# Patient Record
Sex: Male | Born: 1960 | Race: Black or African American | Hispanic: No | Marital: Single | Smoking: Current every day smoker
Health system: Southern US, Community
[De-identification: ages and names within clinical notes are randomized; demographics above are authoritative.]

## PROBLEM LIST (undated history)

## (undated) DIAGNOSIS — J449 Chronic obstructive pulmonary disease, unspecified: Secondary | ICD-10-CM

## (undated) DIAGNOSIS — J40 Bronchitis, not specified as acute or chronic: Secondary | ICD-10-CM

## (undated) DIAGNOSIS — C801 Malignant (primary) neoplasm, unspecified: Secondary | ICD-10-CM

## (undated) DIAGNOSIS — M1990 Unspecified osteoarthritis, unspecified site: Secondary | ICD-10-CM

## (undated) DIAGNOSIS — I10 Essential (primary) hypertension: Secondary | ICD-10-CM

## (undated) DIAGNOSIS — C3490 Malignant neoplasm of unspecified part of unspecified bronchus or lung: Secondary | ICD-10-CM

## (undated) HISTORY — PX: TOTAL HIP ARTHROPLASTY: SHX124

## (undated) HISTORY — PX: LUNG BIOPSY: SHX232

## (undated) HISTORY — DX: Malignant neoplasm of unspecified part of unspecified bronchus or lung: C34.90

---

## 2004-08-06 ENCOUNTER — Emergency Department (HOSPITAL_COMMUNITY): Admission: EM | Admit: 2004-08-06 | Discharge: 2004-08-06 | Payer: Self-pay | Admitting: Emergency Medicine

## 2007-11-12 ENCOUNTER — Ambulatory Visit (HOSPITAL_COMMUNITY): Admission: RE | Admit: 2007-11-12 | Discharge: 2007-11-12 | Payer: Self-pay | Admitting: Internal Medicine

## 2008-01-15 ENCOUNTER — Emergency Department (HOSPITAL_COMMUNITY): Admission: EM | Admit: 2008-01-15 | Discharge: 2008-01-15 | Payer: Self-pay | Admitting: Emergency Medicine

## 2008-12-21 ENCOUNTER — Emergency Department (HOSPITAL_COMMUNITY): Admission: EM | Admit: 2008-12-21 | Discharge: 2008-12-21 | Payer: Self-pay | Admitting: Emergency Medicine

## 2008-12-23 ENCOUNTER — Emergency Department (HOSPITAL_COMMUNITY): Admission: EM | Admit: 2008-12-23 | Discharge: 2008-12-23 | Payer: Self-pay | Admitting: Emergency Medicine

## 2009-01-03 ENCOUNTER — Ambulatory Visit: Payer: Self-pay | Admitting: Cardiology

## 2010-02-22 ENCOUNTER — Emergency Department (HOSPITAL_COMMUNITY): Admission: EM | Admit: 2010-02-22 | Discharge: 2010-02-22 | Payer: Self-pay | Admitting: Emergency Medicine

## 2010-06-27 ENCOUNTER — Ambulatory Visit: Payer: Self-pay | Admitting: Psychiatry

## 2010-06-27 ENCOUNTER — Emergency Department (HOSPITAL_COMMUNITY): Admission: EM | Admit: 2010-06-27 | Discharge: 2010-06-27 | Payer: Self-pay | Admitting: Emergency Medicine

## 2010-06-28 ENCOUNTER — Inpatient Hospital Stay (HOSPITAL_COMMUNITY): Admission: EM | Admit: 2010-06-28 | Discharge: 2010-07-01 | Payer: Self-pay | Admitting: Psychiatry

## 2011-01-13 LAB — BASIC METABOLIC PANEL
CO2: 26 mEq/L (ref 19–32)
Calcium: 8.5 mg/dL (ref 8.4–10.5)
Chloride: 106 mEq/L (ref 96–112)
GFR calc non Af Amer: >60 mL/min (ref >60)
Glucose, Bld: 146 mg/dL — ABNORMAL HIGH (ref 70–99)
Potassium: 3.7 mEq/L (ref 3.5–5.1)
Sodium: 138 mEq/L (ref 135–145)

## 2011-01-13 LAB — DIFFERENTIAL
Basophils Absolute: 0.2 10*3/uL — ABNORMAL HIGH (ref 0.0–0.1)
Basophils Relative: 3 % — ABNORMAL HIGH (ref 0–1)
Eosinophils Absolute: 0.1 10*3/uL (ref 0.0–0.7)
Eosinophils Relative: 1 % (ref 0–5)
Monocytes Absolute: 0.9 10*3/uL (ref 0.1–1.0)
Monocytes Relative: 13 % — ABNORMAL HIGH (ref 3–12)

## 2011-01-13 LAB — URINALYSIS, ROUTINE W REFLEX MICROSCOPIC
Glucose, UA: NEGATIVE mg/dL
Hgb urine dipstick: NEGATIVE
Urobilinogen, UA: 1 mg/dL (ref 0.0–1.0)

## 2011-01-13 LAB — HEPATIC FUNCTION PANEL
ALT: 57 U/L — ABNORMAL HIGH (ref 0–53)
AST: 31 U/L (ref 0–37)
Albumin: 3.7 g/dL (ref 3.5–5.2)
Bilirubin, Direct: 0.2 mg/dL (ref 0.0–0.3)
Indirect Bilirubin: 0.9 mg/dL (ref 0.3–0.9)

## 2011-01-13 LAB — CBC
HCT: 45.3 % (ref 39.0–52.0)
Hemoglobin: 15.3 g/dL (ref 13.0–17.0)
Platelets: 185 10*3/uL (ref 150–400)
RBC: 4.84 MIL/uL (ref 4.22–5.81)
RDW: 14 % (ref 11.5–15.5)

## 2011-01-13 LAB — RAPID URINE DRUG SCREEN, HOSP PERFORMED
Amphetamines: NOT DETECTED
Cocaine: NOT DETECTED

## 2011-07-24 LAB — CBC
HCT: 46.1
MCHC: 34.8
WBC: 13.6 — ABNORMAL HIGH

## 2011-07-24 LAB — URINALYSIS, ROUTINE W REFLEX MICROSCOPIC
Bilirubin Urine: NEGATIVE
Glucose, UA: NEGATIVE
Ketones, ur: NEGATIVE
Nitrite: NEGATIVE
Protein, ur: NEGATIVE
pH: 5.5

## 2011-07-24 LAB — DIFFERENTIAL
Eosinophils Absolute: 0.1
Monocytes Relative: 4

## 2011-07-24 LAB — BLOOD GAS, ARTERIAL
Bicarbonate: 24.9 — ABNORMAL HIGH
FIO2: 0.21
O2 Saturation: 89.7
Patient temperature: 37
pCO2 arterial: 42.9
pH, Arterial: 7.382
pO2, Arterial: 58.1 — ABNORMAL LOW

## 2011-07-24 LAB — COMPREHENSIVE METABOLIC PANEL
ALT: 24
AST: 19
Albumin: 3.6
BUN: 16
CO2: 27
Creatinine, Ser: 1.07
GFR calc Af Amer: >60
Glucose, Bld: 142 — ABNORMAL HIGH
Total Bilirubin: 0.5

## 2011-07-24 LAB — CK TOTAL AND CKMB (NOT AT ARMC)
CK, MB: 1.7
Relative Index: 1
Total CK: 166

## 2011-07-24 LAB — RAPID URINE DRUG SCREEN, HOSP PERFORMED
Amphetamines: NOT DETECTED
Cocaine: POSITIVE — AB
Tetrahydrocannabinol: NOT DETECTED

## 2011-07-24 LAB — ETHANOL: Alcohol, Ethyl (B): 141 — ABNORMAL HIGH

## 2011-12-17 ENCOUNTER — Emergency Department (HOSPITAL_COMMUNITY)
Admission: EM | Admit: 2011-12-17 | Discharge: 2011-12-17 | Disposition: A | Discharge status: Home / Self Care | Payer: Medicaid Other | Attending: Emergency Medicine | Admitting: Emergency Medicine

## 2011-12-17 ENCOUNTER — Encounter (HOSPITAL_COMMUNITY): Payer: Self-pay

## 2011-12-17 ENCOUNTER — Emergency Department (HOSPITAL_COMMUNITY): Payer: Medicaid Other

## 2011-12-17 DIAGNOSIS — M129 Arthropathy, unspecified: Secondary | ICD-10-CM | POA: Insufficient documentation

## 2011-12-17 DIAGNOSIS — R109 Unspecified abdominal pain: Secondary | ICD-10-CM | POA: Insufficient documentation

## 2011-12-17 DIAGNOSIS — M79609 Pain in unspecified limb: Secondary | ICD-10-CM | POA: Insufficient documentation

## 2011-12-17 DIAGNOSIS — R05 Cough: Secondary | ICD-10-CM | POA: Insufficient documentation

## 2011-12-17 DIAGNOSIS — R059 Cough, unspecified: Secondary | ICD-10-CM | POA: Insufficient documentation

## 2011-12-17 DIAGNOSIS — F17200 Nicotine dependence, unspecified, uncomplicated: Secondary | ICD-10-CM | POA: Insufficient documentation

## 2011-12-17 DIAGNOSIS — R0602 Shortness of breath: Secondary | ICD-10-CM | POA: Insufficient documentation

## 2011-12-17 DIAGNOSIS — R071 Chest pain on breathing: Secondary | ICD-10-CM | POA: Insufficient documentation

## 2011-12-17 DIAGNOSIS — R0789 Other chest pain: Secondary | ICD-10-CM

## 2011-12-17 HISTORY — DX: Unspecified osteoarthritis, unspecified site: M19.90

## 2011-12-17 HISTORY — DX: Bronchitis, not specified as acute or chronic: J40

## 2011-12-17 MED ORDER — HYDROCODONE-ACETAMINOPHEN 5-325 MG PO TABS
1.0000 | ORAL_TABLET | Freq: Once | ORAL | Status: AC
  Administered 2011-12-17: 1 via ORAL
  Filled 2011-12-17: qty 1

## 2011-12-17 MED ORDER — HYDROCODONE-ACETAMINOPHEN 5-325 MG PO TABS: 1.0000 | ORAL_TABLET | Freq: Four times a day (QID) | ORAL | Status: AC | PRN

## 2011-12-17 MED ORDER — ALBUTEROL SULFATE HFA 108 (90 BASE) MCG/ACT IN AERS
2.0000 | INHALATION_SPRAY | Freq: Four times a day (QID) | RESPIRATORY_TRACT | Status: DC
  Administered 2011-12-17: 2 via RESPIRATORY_TRACT
  Filled 2011-12-17: qty 6.7

## 2011-12-17 NOTE — ED Notes (Signed)
Pt presents with left side pain. Pt states he felt a pop on left side this AM.

## 2011-12-17 NOTE — ED Provider Notes (Signed)
History    Scribed for Shelda Jakes, MD, the patient was seen in room APA19/APA19. This chart was scribed by Katha Cabal.   CSN: [id number]  Arrival date & time 12/17/11  1428   First MD Initiated Contact with Patient 12/17/11 1516      Chief Complaint  Patient presents with  . Flank Pain    (Consider location/radiation/quality/duration/timing/severity/associated sxs/prior treatment) Patient is a 51 y.o. male presenting with flank pain. The history is provided by the patient. No language interpreter was used.  Flank Pain This is a new problem. The current episode started 3 to 5 hours ago. The problem occurs constantly. The problem has been gradually worsening. Associated symptoms include shortness of breath. Pertinent negatives include no abdominal pain and no headaches. The symptoms are aggravated by coughing. The symptoms are relieved by nothing. He has tried nothing for the symptoms.   Pain began around lunch time.  Patient reports hearing a pop on left side along ribs.  Pain radiates to abdomen but not to the back. Patient has bronchitis.  Patient with productive cough and shortness of breath.   Has an albuterol inhaler but has was not used today.  Nothing taken for pain. Denies nausea, vomiting, diarrhea, neck pain, nasal congestion, rash, fever or dysuria.  Pain more severe with breathing.    No PCP.      Past Medical History  Diagnosis Date  . Arthritis   . Bronchitis     History reviewed. No pertinent past surgical history.  No family history on file.  History  Substance Use Topics  . Smoking status: Current Everyday Smoker -- 0.5 packs/day  . Smokeless tobacco: Not on file  . Alcohol Use: Yes      Review of Systems  Constitutional: Negative for fatigue.  HENT: Negative for congestion and neck pain.   Respiratory: Positive for cough and shortness of breath.   Gastrointestinal: Negative for nausea, vomiting, abdominal pain and diarrhea.  Genitourinary:  Positive for flank pain. Negative for dysuria.  Musculoskeletal: Negative for back pain.  Skin: Negative for rash.  Neurological: Negative for headaches.  All other systems reviewed and are negative.    Allergies  Review of patient's allergies indicates no known allergies.  Home Medications   Current Outpatient Rx  Name Route Sig Dispense Refill  . HYDROCODONE-ACETAMINOPHEN 5-325 MG PO TABS Oral Take 1-2 tablets by mouth every 6 (six) hours as needed for pain. 10 tablet 0    BP 129/79  Pulse 76  Temp(Src) 97.8 F (36.6 C) (Oral)  Resp 16  Ht 5\' 6"  (1.676 m)  Wt 240 lb (108.863 kg)  BMI 38.74 kg/m2  SpO2 98%  Physical Exam  Nursing note and vitals reviewed. Constitutional: He is oriented to person, place, and time. He appears well-developed.  HENT:  Head: Normocephalic and atraumatic.  Mouth/Throat: Mucous membranes are normal. Mucous membranes are not dry.  Eyes: Conjunctivae and EOM are normal.  Neck: Neck supple.  Cardiovascular: Normal rate and regular rhythm.   No murmur heard. Pulmonary/Chest: Effort normal and breath sounds normal. No respiratory distress. He has no wheezes. He has no rales.  Abdominal: Soft. Bowel sounds are normal. There is no tenderness. There is no rebound.  Musculoskeletal: Normal range of motion. He exhibits no edema.       Tenderness anterior axillary line at rib margin around rib 10  Neurological: He is alert and oriented to person, place, and time. No cranial nerve deficit or sensory deficit. Coordination normal.  Skin: Skin is warm and dry. No rash noted.  Psychiatric: He has a normal mood and affect. His behavior is normal.    ED Course  Procedures (including critical care time)   DIAGNOSTIC STUDIES: Oxygen Saturation is 94% on room air, adequate by my interpretation.     COORDINATION OF CARE: 3:24 PM  Physical exam complete.   3:45 PM  Norco ordered.     LABS / RADIOLOGY:   Labs Reviewed - No data to display Dg Ribs  Unilateral W/chest Left  12/17/2011  *RADIOLOGY REPORT*  Clinical Data: Left-sided pain.  Felt pop when coughing.   Smoker.  LEFT RIBS AND CHEST - 3+ VIEW  Comparison: 02/22/2010  Findings: Frontal view of the chest demonstrates midline trachea. Normal heart size and mediastinal contours. No pleural effusion or pneumothorax.  Clear lungs.  3 views of left sided ribs demonstrate no displaced fracture.  IMPRESSION: No displaced rib fracture or acute cardiopulmonary disease.  Original Report Authenticated By: Consuello Bossier, M.D.    No results found for this or any previous visit (from the past 24 hour(s)).      MDM  Clinically left lateral rib wall pain acute onset following cough. Chest x-ray negative for rib fracture pneumothorax or pneumonia. Will treat with albuterol inhaler 2 puffs every 6 hours for a week to help suppress the cough and hydrocodone for pain as needed for the chest wall pain.       MEDICATIONS GIVEN IN THE E.D. Scheduled Meds:    . albuterol  2 puff Inhalation Q6H  . HYDROcodone-acetaminophen  1 tablet Oral Once   Continuous Infusions:      IMPRESSION: 1. Chest wall pain      DISCHARGE MEDICATIONS: New Prescriptions   HYDROCODONE-ACETAMINOPHEN (NORCO) 5-325 MG PER TABLET    Take 1-2 tablets by mouth every 6 (six) hours as needed for pain.     I personally performed the services described in this documentation, which was scribed in my presence. The recorded information has been reviewed and considered.            Shelda Jakes, MD 12/17/11 [phone]

## 2011-12-17 NOTE — ED Notes (Signed)
Pt declined to have spacer

## 2013-04-02 ENCOUNTER — Ambulatory Visit (HOSPITAL_COMMUNITY)
Admission: RE | Admit: 2013-04-02 | Discharge: 2013-04-02 | Disposition: A | Discharge status: Home / Self Care | Payer: Medicare PPO | Source: Ambulatory Visit | Attending: Adult Reconstructive Orthopaedic Surgery | Admitting: Adult Reconstructive Orthopaedic Surgery

## 2013-04-02 DIAGNOSIS — IMO0001 Reserved for inherently not codable concepts without codable children: Secondary | ICD-10-CM | POA: Insufficient documentation

## 2013-04-02 DIAGNOSIS — M25569 Pain in unspecified knee: Secondary | ICD-10-CM | POA: Insufficient documentation

## 2013-04-02 DIAGNOSIS — R262 Difficulty in walking, not elsewhere classified: Secondary | ICD-10-CM | POA: Insufficient documentation

## 2013-04-02 DIAGNOSIS — R29898 Other symptoms and signs involving the musculoskeletal system: Secondary | ICD-10-CM | POA: Insufficient documentation

## 2013-04-02 NOTE — Evaluation (Signed)
Physical Therapy Evaluation  Patient Details  Name: Miguel Marsh MRN: [id number] Date of Birth: Aug 03, 1961  Today's Date: 04/02/2013 Time: 1345-1430 PT Time Calculation (min): 45 min Charges Evaluation: 1  TE: 1422-1430             Visit#: 1 of 8  Re-eval: 05/02/13 Assessment Diagnosis: Lt THR Surgical Date: 03/04/13 Next MD Visit: Dr. Molli Knock- 6/18 Prior Therapy: HHPT 3-4 visits  Authorization: Francine Graven     Authorization Time Period:   Requested 8 visits to [employer] Authorization Visit#: 1 of 8   Past Medical History:  Past Medical History  Diagnosis Date  . Arthritis   . Bronchitis    Past Surgical History: Rt THR 03/04/13 Subjective Symptoms/Limitations Symptoms: PMH: COPF Pertinent History: Pt is a 52 year old male referred to PT s/p Rt THR on 03/04/13.  His c/co is difficulty walking with significant limp.  How long can you stand comfortably?: 1 hour How long can you walk comfortably?: limited by 10-15 minutes (limited by COPD) Patient Stated Goals: want my limp to go away Pain Assessment Currently in Pain?: Yes Pain Score:   3 Pain Location: Knee Pain Orientation: Right;Lateral Pain Type: Acute pain;Surgical pain Pain Onset: 1 to 4 weeks ago Pain Frequency: Intermittent Pain Relieving Factors: pain medication, walking Effect of Pain on Daily Activities: limping  Precautions/Restrictions  Precautions Precautions: Posterior Hip  Balance Screening Balance Screen Has the patient fallen in the past 6 months: No Has the patient had a decrease in activity level because of a fear of falling? : No Is the patient reluctant to leave their home because of a fear of falling? : No  Prior Functioning  Home Living Lives With: Alone Prior Function Vocation: On disability Comments: He enjoys working on cars  Sensation/Coordination/Flexibility/Functional Tests Functional Tests Functional Tests: ABC: 90% Functional Tests: 5 STS w/o UE A: 10 sec  Assessment RLE  Strength Right Hip Flexion: 4/5 Right Hip Extension: 4/5 Right Hip ABduction: 3/5 Right Knee Flexion: 5/5 Right Knee Extension: 5/5 Palpation Palpation: increased fascial restrictions to lateral knee and hip.    Mobility/Balance  Ambulation/Gait Ambulation/Gait: Yes Assistive device: None Gait Pattern: Trendelenburg Static Standing Balance Static Standing - Comment/# of Minutes: WFL   Exercise/Treatments Standing Heel Raises: 10 reps Functional Squat: 10 reps Supine Bridges: Both;5 reps;Limitations Bridges Limitations: 5 sec holds Straight Leg Raises: 10 reps;Limitations Straight Leg Raises Limitations: 3 sec holds Sidelying Hip ABduction: Right;10 reps Clams: 5 reps x 10 sec holds    Physical Therapy Assessment and Plan PT Assessment and Plan Clinical Impression Statement: Pt is a 52 year old male referred to PT s/p Rt THR on 03/14/13 with impairments listed below.  Pt will benefit from skilled therapeutic intervention in order to improve on the following deficits: Abnormal gait;Decreased strength;Pain Rehab Potential: Good PT Frequency: Min 2X/week PT Duration: 4 weeks PT Treatment/Interventions: Gait training;Stair training;Functional mobility training;Therapeutic activities;Therapeutic exercise;Patient/family education PT Plan: Focus on improving gait mechanics, strengthening hip muscluature (hip hikes, lateral step ups, clams, hip abduction in s/l and standing).      Goals Home Exercise Program Pt will Perform Home Exercise Program: Independently PT Goal: Perform Home Exercise Program - Progress: Goal set today PT Short Term Goals Time to Complete Short Term Goals: 4 weeks PT Short Term Goal 1: Pt wil improve RLE strength to [facility] in order to ambulate with improved gait mechanics.  PT Short Term Goal 2: Pt will present with mild fascial restrictions to hip and knee for improved  QOL.   Problem List Patient Active Problem List   Diagnosis Date Noted  . Difficulty  in walking(719.7) 04/02/2013  . Hip weakness 04/02/2013   PT Plan of Care PT Home Exercise Plan: see scanned report PT Patient Instructions: importance of hip strength to improve gait mechanics, answered questions about diagnosis Consulted and Agree with Plan of Care: Patient  GP Functional Assessment Tool Used: ABC and clinical observation Functional Limitation: Mobility: Walking and moving around Mobility: Walking and Moving Around Current Status (W0981): At least 20 percent but less than 40 percent impaired, limited or restricted Mobility: Walking and Moving Around Goal Status [phone]): At least 1 percent but less than 20 percent impaired, limited or restricted  Mindee Robledo, MPT, ATC 04/02/2013, 6:28 PM  Physician Documentation Your signature is required to indicate approval of the treatment plan as stated above.  Please sign and either send electronically or make a copy of this report for your files and return this physician signed original.   Please mark one 1.__approve of plan  2. ___approve of plan with the following conditions.   ______________________________                                                          _____________________ Physician Signature                                                                                                             Date

## 2013-04-08 ENCOUNTER — Ambulatory Visit (HOSPITAL_COMMUNITY)
Admission: RE | Admit: 2013-04-08 | Discharge: 2013-04-08 | Disposition: A | Discharge status: Home / Self Care | Payer: Medicare PPO | Source: Ambulatory Visit | Attending: Adult Reconstructive Orthopaedic Surgery | Admitting: Adult Reconstructive Orthopaedic Surgery

## 2013-04-08 NOTE — Progress Notes (Signed)
Physical Therapy Treatment Patient Details  Name: Miguel Marsh MRN: [id number] Date of Birth: 09-19-1961  Today's Date: 04/08/2013 Time: 1130-1215 PT Time Calculation (min): 45 min  Visit#: 2 of 8  Re-eval: 05/02/13 Charges: Therex x 30'  Authorization: [employer]   Authorization Visit#: 2 of 10   Subjective: Symptoms/Limitations Symptoms: Pt reports HEP compliance. Pain Assessment Currently in Pain?: Yes Pain Score:   3 Pain Location: Knee Pain Orientation: Right   Exercise/Treatments Standing Heel Raises: 10 reps Lateral Step Up: 10 reps;Right;Hand Hold: 2;Step Height: 4" Functional Squat: 10 reps Other Standing Knee Exercises: Side stepping 2 RT Supine Hip Adduction Isometric: 10 reps Bridges: 10 reps Bridges Limitations: 5 sec holds Straight Leg Raises: 10 reps;Limitations Straight Leg Raises Limitations: 3 sec holds Other Supine Knee Exercises: Isometric hip flexion 10x5" Sidelying Hip ABduction: 10 reps Hip ADduction: Limitations Hip ADduction Limitations: 5 reps x 10 sec holds Clams: 5 reps x 10 sec holds Prone  Hip Extension: 10 reps  Physical Therapy Assessment and Plan PT Assessment and Plan Clinical Impression Statement: Pt requires multimodal cueing to properly complete new exercises. Pt tends to externally rotate left hip with side stepping secondary to weakness. Pt is without complaint throughout session. Pt will benefit from skilled therapeutic intervention in order to improve on the following deficits: Abnormal gait;Decreased strength;Pain Rehab Potential: Good PT Frequency: Min 2X/week PT Duration: 4 weeks PT Treatment/Interventions: Gait training;Stair training;Functional mobility training;Therapeutic activities;Therapeutic exercise;Patient/family education PT Plan: Focus on improving gait mechanics, strengthening hip muscluature (hip hikes, lateral step ups, clams, hip abduction in s/l and standing).       Problem List Patient Active Problem  List   Diagnosis Date Noted  . Difficulty in walking(719.7) 04/02/2013  . Hip weakness 04/02/2013    Seth Bake, PTA 04/08/2013, 12:28 PM

## 2013-04-10 ENCOUNTER — Ambulatory Visit (HOSPITAL_COMMUNITY)
Admission: RE | Admit: 2013-04-10 | Discharge: 2013-04-10 | Disposition: A | Discharge status: Home / Self Care | Payer: Medicare PPO | Source: Ambulatory Visit | Attending: Adult Reconstructive Orthopaedic Surgery | Admitting: Adult Reconstructive Orthopaedic Surgery

## 2013-04-10 NOTE — Progress Notes (Signed)
Physical Therapy Treatment Patient Details  Name: Miguel Marsh MRN: [id number] Date of Birth: [birth year]  Today's Date: 04/10/2013 Time: 1137-1217 PT Time Calculation (min): 40 min  Visit#: 3 of 8  Re-eval: 05/02/13 Authorization: Francine Graven   Authorization Visit#: 3 of 10  Charges:  therex 38'  Subjective: Symptoms/Limitations Symptoms: pt. states his Rt hip is not hurting, however his Rt knee is 3/10. Pain Assessment Currently in Pain?: Yes Pain Score:   3 Pain Location: Knee Pain Orientation: Right   Exercise/Treatments Standing Heel Raises: 15 reps;Limitations Heel Raises Limitations: toeraises 15 reps Hip ADduction: Limitations Hip ADduction Limitations: hip hike; RLE on 4" step, requires AA Lateral Step Up: 10 reps;Right;Hand Hold: 1;Step Height: 4" Functional Squat: 10 reps Other Standing Knee Exercises: Side stepping 2 RT with red theraband under feet Supine Hip Adduction Isometric: 10 reps Bridges: 10 reps Bridges Limitations: 5 sec holds Straight Leg Raises: Right;2 sets;10 reps Other Supine Knee Exercises: Isometric hip flexion 10x5" Sidelying Hip ABduction: Right;2 sets;10 reps Clams: 10 reps x 10 sec holds Prone  Hip Extension: 2 sets;10 reps      Physical Therapy Assessment and Plan PT Assessment and Plan Clinical Impression Statement: Pt. reports he's walked with a lean (trendelenburg) to the Left all his life.  Noted weakness in R hip musculature.  Added hip hiking with Rt hip needing manual assist to initiate mm contraction.  Progressed difficulty of side stepping with use of theraband.  Pt. able to complete 2 sets of most exercises today. PT Plan: Continue with focus on improving gait mechanices and strengthening hip musculature.  Progress exercises as able.     Problem List Patient Active Problem List   Diagnosis Date Noted  . Difficulty in walking(719.7) 04/02/2013  . Hip weakness 04/02/2013      Lurena Nida, PTA/CLT 04/10/2013, 12:19  PM

## 2013-04-14 ENCOUNTER — Ambulatory Visit (HOSPITAL_COMMUNITY)
Admission: RE | Admit: 2013-04-14 | Discharge: 2013-04-14 | Disposition: A | Discharge status: Home / Self Care | Payer: Medicare PPO | Source: Ambulatory Visit | Attending: Adult Reconstructive Orthopaedic Surgery | Admitting: Adult Reconstructive Orthopaedic Surgery

## 2013-04-14 DIAGNOSIS — R29898 Other symptoms and signs involving the musculoskeletal system: Secondary | ICD-10-CM

## 2013-04-14 DIAGNOSIS — R262 Difficulty in walking, not elsewhere classified: Secondary | ICD-10-CM

## 2013-04-14 NOTE — Progress Notes (Addendum)
Physical Therapy Treatment Patient Details  Name: Miguel Marsh MRN: [id number] Date of Birth: Apr 05, 1961  Today's Date: 04/14/2013 Time: 0930-1015 PT Time Calculation (min): 45 min Charges: Gait: 930-955 [place]: [phone] Manual: 1005-1015 Visit#: 4 of 8  Re-eval: 05/02/13    Authorization: Francine Graven   Authorization Time Period: coverage for all visits from 04/03/13-05/18/13  Authorization Visit#: 4 of 10   Subjective: Symptoms/Limitations Symptoms: Pt reports that his hip continues to improve, knee still sore  Precautions/Restrictions     Exercise/Treatments Standing Hip ADduction Limitations: hip hike; RLE on 4" step x10 reps (pre gait activity) Lateral Step Up: 10 reps;Right;Hand Hold: 1;Step Height: 4"  (pre gait activity) Forward Step Up: Right;10 reps;Hand Hold: 2;Step Height: 4" (pre gait activity) Gait Training: Long Hallway x10 minutes with VC and TC for posture, 5# weight in Rt hand to improve Rt shoulder elevation and Lt shoulder depression; Heel walking 3 RT, Toe walking 3 RT  Modalities Modalities: Ultrasound Manual Therapy Manual Therapy: Myofascial release Myofascial Release: after Ultrasound to Rt lateral knee to decrease fascial restrictions to improve QOL. Ultrasound Ultrasound Location: rt lateral knee  Ultrasound Parameters: 3 mHz (medium head), 0.8 w/cm2, continous x8 minutes  Ultrasound Goals: Pain  Physical Therapy Assessment and Plan PT Assessment and Plan Clinical Impression Statement: Pt verabalizes appropropriate posture and demonstrates a 50% improvement in gait at end of session.  Pt able to complete 2 in lateral step up x5 reps after [place] and manual therapy.  Notable Rt foot supination with Lt foot prontation, suggested using Rt arch support and taking out Lt foot support to even out gait.  PT Plan: Continue with focus on improving gait mechanices and strengthening hip musculature.  Progress exercises as able.    Goals Home Exercise Program Pt  will Perform Home Exercise Program: Independently PT Short Term Goals Time to Complete Short Term Goals: 4 weeks PT Short Term Goal 1: Pt wil improve RLE strength to [facility] in order to ambulate with improved gait mechanics.  PT Short Term Goal 1 - Progress: Progressing toward goal PT Short Term Goal 2: Pt will present with mild fascial restrictions to hip and knee for improved QOL.  PT Short Term Goal 2 - Progress: Progressing toward goal  Problem List Patient Active Problem List   Diagnosis Date Noted  . Difficulty in walking(719.7) 04/02/2013  . Hip weakness 04/02/2013    PT Plan of Care PT Patient Instructions: Teach back for: posture with gait, orthotic use  GP    Undra Harriman, MPT ATC 04/14/2013, 10:27 AM

## 2013-04-15 ENCOUNTER — Ambulatory Visit (HOSPITAL_COMMUNITY)
Admission: RE | Admit: 2013-04-15 | Discharge: 2013-04-15 | Disposition: A | Discharge status: Home / Self Care | Payer: Medicare PPO | Source: Ambulatory Visit | Attending: Adult Reconstructive Orthopaedic Surgery | Admitting: Adult Reconstructive Orthopaedic Surgery

## 2013-04-15 DIAGNOSIS — R29898 Other symptoms and signs involving the musculoskeletal system: Secondary | ICD-10-CM

## 2013-04-15 DIAGNOSIS — R262 Difficulty in walking, not elsewhere classified: Secondary | ICD-10-CM

## 2013-04-15 NOTE — Progress Notes (Signed)
Physical Therapy Treatment Patient Details  Name: Miguel Marsh MRN: [id number] Date of Birth: 1961/09/02  Today's Date: 04/15/2013 Time: 1102-1140 PT Time Calculation (min): 38 min Charges:  orthotic management: 1105-1115, TE: 1115-1140 Visit#: 5 of 8  Re-eval: 05/02/13 Assessment Diagnosis: Lt THR Surgical Date: 03/04/13 Next MD Visit: Dr. Molli Knock- 6/18  Authorization: Francine Graven   Authorization Time Period: coverage for all visits from 04/03/13-05/18/13  Authorization Visit#: 5 of 10   Subjective: Symptoms/Limitations Symptoms: Pt states that the [place] and manual helped some to his knee last visit,  He will be seeing his MD tomorrow to discuss his knee pain.  He is working hard on his posture during gait.  Pain Assessment Currently in Pain?: No/denies  Precautions/Restrictions     Exercise/Treatments Standing Hip ADduction Limitations: hip hike; RLE on 4" step x10 reps Forward Step Up: Right;15 reps;Step Height: 6";Hand Hold: 0 Wall Squat: 15 reps;Limitations;3 seconds Wall Squat Limitations: with green theraball Rocker Board: 2 minutes Other Standing Knee Exercises: Sit to stand RLE only x5 reps  Sidelying Hip ABduction: Right;10 reps;Limitations;Left Hip ABduction Limitations: 3 sec holds Clams: Rt: 10 reps x 10 sec holds; Lt: 5x10 sec holds  Physical Therapy Assessment and Plan PT Assessment and Plan Clinical Impression Statement: Pt brought in orthotics, fitted orthotic to Lt foot to improve supination.  Pt continues to demonstrate significant motor control difficulty and pes planus with LLE and improve mechanics with RLE and requires.  Notable weakness to his LLE, added strengthening to LLE to improve gait mechanics.  PT Plan: Continue with focus on improving gait mechanices and strengthening hip musculature.  Progress exercises as able.    Goals    Problem List Patient Active Problem List   Diagnosis Date Noted  . Difficulty in walking(719.7) 04/02/2013  . Hip  weakness 04/02/2013    PT Plan of Care PT Patient Instructions: Teach back for: posture with gait, orthotic use  Miguel Marsh, MPT, ATC 04/15/2013, 11:51 AM

## 2013-04-21 ENCOUNTER — Ambulatory Visit (HOSPITAL_COMMUNITY): Payer: Medicare PPO | Admitting: Physical Therapy

## 2013-04-22 ENCOUNTER — Ambulatory Visit (HOSPITAL_COMMUNITY): Payer: Medicare PPO | Admitting: Physical Therapy

## 2013-04-23 ENCOUNTER — Ambulatory Visit (HOSPITAL_COMMUNITY)
Admission: RE | Admit: 2013-04-23 | Discharge: 2013-04-23 | Disposition: A | Discharge status: Home / Self Care | Payer: Medicare PPO | Source: Ambulatory Visit | Attending: Adult Reconstructive Orthopaedic Surgery | Admitting: Adult Reconstructive Orthopaedic Surgery

## 2013-04-23 DIAGNOSIS — R262 Difficulty in walking, not elsewhere classified: Secondary | ICD-10-CM

## 2013-04-23 DIAGNOSIS — R29898 Other symptoms and signs involving the musculoskeletal system: Secondary | ICD-10-CM

## 2013-04-23 NOTE — Progress Notes (Signed)
Physical Therapy Treatment Patient Details  Name: Miguel Marsh MRN: [id number] Date of Birth: [birth year]  Today's Date: 04/23/2013 Time: 4098-1191 PT Time Calculation (min): 30 min Charge: TE 4782-9562  Visit#: 6 of 8  Re-eval: 05/02/13 Assessment Diagnosis: Lt THR Surgical Date: 03/04/13 Next MD Visit: Dr. Molli Knock- 7/31 Prior Therapy: HHPT 3-4 visits  Authorization: Francine Graven   Authorization Time Period: coverage for all visits from 04/03/13-05/18/13  Authorization Visit#: 6 of 10   Subjective: Symptoms/Limitations Symptoms: Pt stated MD happy with progress.  Pt reported pain free both hip and knee today.  Pt reported increased ease with daily activities, has been workiing on cars without difficulty. Pain Assessment Currently in Pain?: No/denies  Precautions/Restrictions  Precautions Precautions: Posterior Hip  Exercise/Treatments Standing Hip ADduction Limitations: hip hike; RLE on 4" step x15 reps 5" hold Lateral Step Up: Right;15 reps;Hand Hold: 1;Step Height: 4" Wall Squat: 15 reps;Limitations;5 seconds Rocker Board: 2 minutes;Limitations Rocker Board Limitations: R/L and A/P no HHA Gait Training: Long hallway gait training to reduce Other Standing Knee Exercises: balance beam tandem, retro and sidestepping 1RT each Other Standing Knee Exercises: Sidestepping with theraband 5 sets,  4, 3, 2, 1 steps    Physical Therapy Assessment and Plan PT Assessment and Plan Clinical Impression Statement: Added dynamic balance activities for hip musculature strengthening and to improve gait mechanics.  Pt continues to demonstrate significant motor control difficulty and pes planus with gait, improved mechanics following new activities and cueing to focus on mechanics with gait.   PT Plan: Continue with focus on improving gait mechanices and strengthening hip musculature.  Progress exercises as able.    Goals    Problem List Patient Active Problem List   Diagnosis Date Noted   . Difficulty in walking(719.7) 04/02/2013  . Hip weakness 04/02/2013    PT - End of Session Activity Tolerance: Patient tolerated treatment well General Behavior During Therapy: [facility] for tasks assessed/performed  GP    Juel Burrow 04/23/2013, 2:30 PM

## 2013-04-24 ENCOUNTER — Ambulatory Visit (HOSPITAL_COMMUNITY)
Admission: RE | Admit: 2013-04-24 | Discharge: 2013-04-24 | Disposition: A | Discharge status: Home / Self Care | Payer: Medicare PPO | Source: Ambulatory Visit | Attending: Adult Reconstructive Orthopaedic Surgery | Admitting: Adult Reconstructive Orthopaedic Surgery

## 2013-04-24 NOTE — Progress Notes (Signed)
Physical Therapy Treatment Patient Details  Name: Miguel Marsh MRN: [id number] Date of Birth: 10/06/1961  Today's Date: 04/24/2013 Time: 8295-6213 PT Time Calculation (min): 30 min  Visit#: 7 of 8  Re-eval: 05/02/13 Charges: Therex x 10' (1112-1122) NMR x 10'(1122-1132) Gait x 8' (1132-1140)  Authorization: [employer]   Authorization Time Period: coverage for all visits from 04/03/13-05/18/13  Authorization Visit#: 7 of 10   Subjective: Symptoms/Limitations Symptoms: Pt states that he is doing a lot of walking. Pain Assessment Currently in Pain?: No/denies  Precautions/Restrictions     Exercise/Treatments Standing Hip ADduction Limitations: hip hike; RLE on 4" step x15 reps 5" hold Lateral Step Up: Right;15 reps;Hand Hold: 1;Step Height: 4" Wall Squat: 15 reps;Limitations;5 seconds Rocker Board: 2 minutes;Limitations Rocker Board Limitations: R/L and A/P no HHA Gait Training: In department working on equalizing weight distribution and stance time. Other Standing Knee Exercises: balance beam tandem, retro and sidestepping 2RT each Other Standing Knee Exercises: Sidestepping with theraband 5 sets,  4, 3, 2, 1 steps  Physical Therapy Assessment and Plan PT Assessment and Plan Clinical Impression Statement: Pt is progressing well toward goals. Pt displays improved proprioceptive control with balance beam activities and rocker board. Pt continues to require moderate cueing to improve gait quality. Pt displays ankle pronation, raised left shoulder and uneven stance time with gait. PT Plan: Continue with focus on improving gait mechanices and strengthening hip musculature.  Progress exercises as able.     Problem List Patient Active Problem List   Diagnosis Date Noted  . Difficulty in walking(719.7) 04/02/2013  . Hip weakness 04/02/2013    PT - End of Session Activity Tolerance: Patient tolerated treatment well General Behavior During Therapy: [facility] for tasks  assessed/performed  Seth Bake, PTA 04/24/2013, 12:28 PM

## 2013-05-02 ENCOUNTER — Emergency Department (HOSPITAL_COMMUNITY)
Admission: EM | Admit: 2013-05-02 | Discharge: 2013-05-02 | Disposition: A | Discharge status: Home / Self Care | Payer: Medicare PPO | Attending: Emergency Medicine | Admitting: Emergency Medicine

## 2013-05-02 ENCOUNTER — Encounter (HOSPITAL_COMMUNITY): Payer: Self-pay | Admitting: Emergency Medicine

## 2013-05-02 DIAGNOSIS — Z79899 Other long term (current) drug therapy: Secondary | ICD-10-CM | POA: Insufficient documentation

## 2013-05-02 DIAGNOSIS — L03211 Cellulitis of face: Secondary | ICD-10-CM | POA: Insufficient documentation

## 2013-05-02 DIAGNOSIS — M129 Arthropathy, unspecified: Secondary | ICD-10-CM | POA: Insufficient documentation

## 2013-05-02 DIAGNOSIS — Z8709 Personal history of other diseases of the respiratory system: Secondary | ICD-10-CM | POA: Insufficient documentation

## 2013-05-02 DIAGNOSIS — L0201 Cutaneous abscess of face: Secondary | ICD-10-CM | POA: Insufficient documentation

## 2013-05-02 DIAGNOSIS — F17200 Nicotine dependence, unspecified, uncomplicated: Secondary | ICD-10-CM | POA: Insufficient documentation

## 2013-05-02 MED ORDER — SULFAMETHOXAZOLE-TRIMETHOPRIM 800-160 MG PO TABS: 1.0000 | ORAL_TABLET | Freq: Two times a day (BID) | ORAL | Status: DC

## 2013-05-02 MED ORDER — HYDROCODONE-ACETAMINOPHEN 5-325 MG PO TABS: 1.0000 | ORAL_TABLET | ORAL | Status: DC | PRN

## 2013-05-02 MED ORDER — LIDOCAINE HCL (PF) 1 % IJ SOLN
5.0000 mL | Freq: Once | INTRAMUSCULAR | Status: AC
  Administered 2013-05-02: 5 mL
  Filled 2013-05-02: qty 5

## 2013-05-02 NOTE — ED Notes (Signed)
Pt c/o left check swelling. States he "popped a pimple that had been there for two weeks yesterday and then it started swelling".

## 2013-05-02 NOTE — ED Notes (Addendum)
Pt left cheek dressed with telfa and medifix tape. Pt tolerated well. Pt educated on home care,s/s of infection, and hand hygiene.  Pt also instructed to apply warm compresses to site per PA verbal order.

## 2013-05-02 NOTE — ED Provider Notes (Signed)
History    CSN: [id number] Arrival date & time 05/02/13  4098  First MD Initiated Contact with Patient 05/02/13 [phone]     Chief Complaint  Patient presents with  . Abscess   (Consider location/radiation/quality/duration/timing/severity/associated sxs/prior Treatment) Patient is a 52 y.o. male presenting with abscess. The history is provided by the patient.  Abscess Location:  Face Facial abscess location:  L cheek Abscess quality: draining, fluctuance, painful, redness and warmth   Red streaking: no   Duration:  24 hours Progression:  Worsening Pain details:    Quality:  Throbbing and sharp   Severity:  Moderate   Duration:  8 hours   Timing:  Constant   Progression:  Worsening Chronicity:  New Associated symptoms: no fever, no nausea and no vomiting    Miguel Marsh is a 52 y.o. male who presents to the ED with an abscess on the left side of his face. Last night he mashed a pimple and this morning his face was swollen and tender. The area has drained a small amount this morning.  Past Medical History  Diagnosis Date  . Arthritis   . Bronchitis    Past Surgical History  Procedure Laterality Date  . Total hip arthroplasty     No family history on file. History  Substance Use Topics  . Smoking status: Current Every Day Smoker -- 0.50 packs/day  . Smokeless tobacco: Not on file  . Alcohol Use: Yes    Review of Systems  Constitutional: Negative for fever and chills.  HENT: Negative for neck pain.   Eyes: Negative for pain.  Respiratory: Negative for shortness of breath.   Gastrointestinal: Negative for nausea and vomiting.  Skin: Positive for wound.  Psychiatric/Behavioral: The patient is not nervous/anxious.     Allergies  Review of patient's allergies indicates no known allergies.  Home Medications   Current Outpatient Rx  Name  Route  Sig  Dispense  Refill  . albuterol (PROVENTIL HFA;VENTOLIN HFA) 108 (90 BASE) MCG/ACT inhaler   Inhalation   Inhale  2 puffs into the lungs every 6 (six) hours as needed for wheezing.         . Multiple Vitamin (MULTIVITAMIN WITH MINERALS) TABS   Oral   Take 1 tablet by mouth daily.         Marland Kitchen tiotropium (SPIRIVA) 18 MCG inhalation capsule   Inhalation   Place 18 mcg into inhaler and inhale daily.         . traMADol (ULTRAM) 50 MG tablet   Oral   Take 50 mg by mouth every 6 (six) hours as needed for pain.           BP 132/83  Pulse 96  Temp(Src) 98.4 F (36.9 C)  Resp 20  Ht 5\' 6"  (1.676 m)  Wt 230 lb (104.327 kg)  BMI 37.14 kg/m2  SpO2 92% Physical Exam  Nursing note and vitals reviewed. Constitutional: He is oriented to person, place, and time. He appears well-developed and well-nourished.  HENT:  Head:    Raised tender papular area left cheek with surrounding erythema.  Eyes: EOM are normal.  Neck: Neck supple.  Cardiovascular: Normal rate.   Pulmonary/Chest: Effort normal.  Musculoskeletal: Normal range of motion.  Neurological: He is alert and oriented to person, place, and time. No cranial nerve deficit.  Skin:  Abscess face  Psychiatric: He has a normal mood and affect. His behavior is normal.    ED Course  Procedures INCISION AND DRAINAGE  Performed by: NEESE,HOPE Consent: Verbal consent obtained. Risks and benefits: risks, benefits and alternatives were discussed Type: abscess  Body area: left side of face  Anesthesia: local infiltration  Incision was made with a scalpel # 11 blade  Local anesthetic: lidocaine 1% without epinephrine  Anesthetic total: 2 ml  Complexity: complex Blunt dissection to break up loculations  Drainage: purulent  Drainage amount: small  Irrigated with NSS  Packing material: 1/4 in iodoform gauze  Patient tolerance: Patient tolerated the procedure well with no immediate complications.    MDM  52 y.o. male with facial abscess, will treat with antibiotics and he will continue warm wet compresses and drain removal in 2  days. He will return for any problems.  Discussed with the patient clinical findings and plan of care and all questioned fully answered.   Medication List    STOP taking these medications       traMADol 50 MG tablet  Commonly known as:  ULTRAM      TAKE these medications       HYDROcodone-acetaminophen 5-325 MG per tablet  Commonly known as:  NORCO/VICODIN  Take 1 tablet by mouth every 4 (four) hours as needed.     sulfamethoxazole-trimethoprim 800-160 MG per tablet  Commonly known as:  SEPTRA DS  Take 1 tablet by mouth every 12 (twelve) hours.      ASK your doctor about these medications       albuterol 108 (90 BASE) MCG/ACT inhaler  Commonly known as:  PROVENTIL HFA;VENTOLIN HFA  Inhale 2 puffs into the lungs every 6 (six) hours as needed for wheezing.     multivitamin with minerals Tabs  Take 1 tablet by mouth daily.     tiotropium 18 MCG inhalation capsule  Commonly known as:  SPIRIVA  Place 18 mcg into inhaler and inhale daily.          [facility] Orlene Och, [place] 05/02/13 1047

## 2013-05-02 NOTE — ED Provider Notes (Signed)
  Medical screening examination/treatment/procedure(s) were performed by non-physician practitioner and as supervising physician I was immediately available for consultation/collaboration.   Lyanne Co, MD 05/02/13 1239

## 2013-10-21 ENCOUNTER — Emergency Department (HOSPITAL_COMMUNITY): Payer: Medicare PPO

## 2013-10-21 ENCOUNTER — Emergency Department (HOSPITAL_COMMUNITY)
Admission: EM | Admit: 2013-10-21 | Discharge: 2013-10-21 | Disposition: A | Discharge status: Home / Self Care | Payer: Medicare PPO | Attending: Emergency Medicine | Admitting: Emergency Medicine

## 2013-10-21 ENCOUNTER — Encounter (HOSPITAL_COMMUNITY): Payer: Self-pay | Admitting: Emergency Medicine

## 2013-10-21 DIAGNOSIS — Z8709 Personal history of other diseases of the respiratory system: Secondary | ICD-10-CM | POA: Insufficient documentation

## 2013-10-21 DIAGNOSIS — Z79899 Other long term (current) drug therapy: Secondary | ICD-10-CM | POA: Insufficient documentation

## 2013-10-21 DIAGNOSIS — N200 Calculus of kidney: Secondary | ICD-10-CM

## 2013-10-21 DIAGNOSIS — F17200 Nicotine dependence, unspecified, uncomplicated: Secondary | ICD-10-CM | POA: Insufficient documentation

## 2013-10-21 DIAGNOSIS — M129 Arthropathy, unspecified: Secondary | ICD-10-CM | POA: Insufficient documentation

## 2013-10-21 LAB — CBC WITH DIFFERENTIAL/PLATELET
HCT: 50.4 % (ref 39.0–52.0)
Hemoglobin: 17.3 g/dL — ABNORMAL HIGH (ref 13.0–17.0)
Lymphocytes Relative: 19 % (ref 12–46)
Lymphs Abs: 1.8 10*3/uL (ref 0.7–4.0)
MCHC: 34.3 g/dL (ref 30.0–36.0)
Monocytes Absolute: 1.2 10*3/uL — ABNORMAL HIGH (ref 0.1–1.0)
Monocytes Relative: 12 % (ref 3–12)
Neutro Abs: 6.3 10*3/uL (ref 1.7–7.7)
Neutrophils Relative %: 68 % (ref 43–77)
RBC: 5.63 MIL/uL (ref 4.22–5.81)
WBC: 9.3 10*3/uL (ref 4.0–10.5)

## 2013-10-21 LAB — URINALYSIS, ROUTINE W REFLEX MICROSCOPIC
Glucose, UA: 100 mg/dL — AB
Ketones, ur: NEGATIVE mg/dL
Leukocytes, UA: NEGATIVE
Specific Gravity, Urine: >1.03 — ABNORMAL HIGH (ref 1.005–1.030)
pH: 5.5 (ref 5.0–8.0)

## 2013-10-21 LAB — COMPREHENSIVE METABOLIC PANEL
Alkaline Phosphatase: 89 U/L (ref 39–117)
BUN: 20 mg/dL (ref 6–23)
CO2: 22 mEq/L (ref 19–32)
Chloride: 102 mEq/L (ref 96–112)
Creatinine, Ser: 1.15 mg/dL (ref 0.50–1.35)
GFR calc non Af Amer: 72 mL/min — ABNORMAL LOW (ref >90)
Glucose, Bld: 141 mg/dL — ABNORMAL HIGH (ref 70–99)
Potassium: 3.8 mEq/L (ref 3.5–5.1)
Total Bilirubin: 0.5 mg/dL (ref 0.3–1.2)

## 2013-10-21 LAB — URINE MICROSCOPIC-ADD ON

## 2013-10-21 MED ORDER — KETOROLAC TROMETHAMINE 60 MG/2ML IM SOLN
60.0000 mg | Freq: Once | INTRAMUSCULAR | Status: AC
  Administered 2013-10-21: 60 mg via INTRAMUSCULAR
  Filled 2013-10-21: qty 2

## 2013-10-21 MED ORDER — OXYCODONE-ACETAMINOPHEN 5-325 MG PO TABS: 2.0000 | ORAL_TABLET | ORAL | Status: DC | PRN

## 2013-10-21 MED ORDER — OXYCODONE-ACETAMINOPHEN 5-325 MG PO TABS
2.0000 | ORAL_TABLET | Freq: Once | ORAL | Status: AC
  Administered 2013-10-21: 2 via ORAL
  Filled 2013-10-21: qty 2

## 2013-10-21 NOTE — ED Notes (Signed)
Patient was discharged but was brought back to er per dr Judd Lien to medicate him with toradol60mg  im and percocet times 2 po.

## 2013-10-21 NOTE — ED Provider Notes (Signed)
CSN: [id number]     Arrival date & time 10/21/13  0045 History   First MD Initiated Contact with Patient 10/21/13 0051     Chief Complaint  Patient presents with  . Flank Pain  . Groin Pain   (Consider location/radiation/quality/duration/timing/severity/associated sxs/prior Treatment) HPI Comments: Patient is a 52 year old male with history of arthritis and bronchitis. He presents today with complaints of pain in the left groin and left flank that has been occurring intermittently for the past 3 weeks. He denies any injury or trauma. He denies any urinary or bowel complaints. He has no history of kidney stones. He states the pain became significantly worse this evening and was severe for the past 3 hours. Shortly after arriving to the emergency department his pain subsided and is now gone.  Patient is a 52 y.o. male presenting with flank pain. The history is provided by the patient.  Flank Pain This is a new problem. Episode onset: 3 weeks ago. Episode frequency: Intermittently. The problem has been gradually worsening. Nothing aggravates the symptoms. Nothing relieves the symptoms. He has tried nothing for the symptoms. The treatment provided no relief.    Past Medical History  Diagnosis Date  . Arthritis   . Bronchitis    Past Surgical History  Procedure Laterality Date  . Total hip arthroplasty     History reviewed. No pertinent family history. History  Substance Use Topics  . Smoking status: Current Every Day Smoker -- 0.50 packs/day  . Smokeless tobacco: Not on file  . Alcohol Use: Yes    Review of Systems  Genitourinary: Positive for flank pain.  All other systems reviewed and are negative.    Allergies  Review of patient's allergies indicates no known allergies.  Home Medications   Current Outpatient Rx  Name  Route  Sig  Dispense  Refill  . albuterol (PROVENTIL HFA;VENTOLIN HFA) 108 (90 BASE) MCG/ACT inhaler   Inhalation   Inhale 2 puffs into the lungs every 6  (six) hours as needed for wheezing.         . tiotropium (SPIRIVA) 18 MCG inhalation capsule   Inhalation   Place 18 mcg into inhaler and inhale daily.         Marland Kitchen HYDROcodone-acetaminophen (NORCO/VICODIN) 5-325 MG per tablet   Oral   Take 1 tablet by mouth every 4 (four) hours as needed.   15 tablet   0   . Multiple Vitamin (MULTIVITAMIN WITH MINERALS) TABS   Oral   Take 1 tablet by mouth daily.         Marland Kitchen sulfamethoxazole-trimethoprim (SEPTRA DS) 800-160 MG per tablet   Oral   Take 1 tablet by mouth every 12 (twelve) hours.   14 tablet   0    BP 119/73  Pulse 100  Temp(Src) 97.8 F (36.6 C) (Oral)  Resp 20  Ht 5\' 6"  (1.676 m)  Wt 240 lb (108.863 kg)  BMI 38.76 kg/m2  SpO2 100% Physical Exam  Nursing note and vitals reviewed. Constitutional: He is oriented to person, place, and time. He appears well-developed and well-nourished. No distress.  HENT:  Head: Normocephalic and atraumatic.  Mouth/Throat: Oropharynx is clear and moist.  Neck: Normal range of motion. Neck supple.  Cardiovascular: Normal rate, regular rhythm and normal heart sounds.   No murmur heard. Pulmonary/Chest: Effort normal and breath sounds normal. No respiratory distress. He has no wheezes.  Abdominal: Soft. Bowel sounds are normal. He exhibits no distension and no mass. There is no tenderness.  There is no rebound and no guarding.  Musculoskeletal: Normal range of motion. He exhibits no edema.  Neurological: He is alert and oriented to person, place, and time.  Skin: Skin is warm and dry. He is not diaphoretic.    ED Course  Procedures (including critical care time) Labs Review Labs Reviewed  URINALYSIS, ROUTINE W REFLEX MICROSCOPIC  CBC WITH DIFFERENTIAL  COMPREHENSIVE METABOLIC PANEL   Imaging Review No results found.    MDM  No diagnosis found. Patient presents with left flank pain radiating into the groin. Urinalysis reveals moderate hemoglobin and CT scan confirms a 3 mm  calculus at the left UVJ. He is currently pain free and will be discharged with pain medication should his symptoms recur. I will also provide followup information for Dr. Jerre Simon if his symptoms are not improving in the next 2-3 days. He is to return if he develops fever, worsening pain, or vomiting with an inability to keep his medications down.    Geoffery Lyons, MD 10/21/13 [phone]

## 2013-10-21 NOTE — ED Notes (Signed)
Pt c/o left flank pain that radiates down to the groin intermittently x 3 weeks.

## 2014-03-13 ENCOUNTER — Encounter (HOSPITAL_COMMUNITY): Payer: Self-pay | Admitting: Emergency Medicine

## 2014-03-13 ENCOUNTER — Emergency Department (HOSPITAL_COMMUNITY)
Admission: EM | Admit: 2014-03-13 | Discharge: 2014-03-13 | Disposition: A | Discharge status: Home / Self Care | Payer: Medicare PPO | Attending: Emergency Medicine | Admitting: Emergency Medicine

## 2014-03-13 DIAGNOSIS — F17200 Nicotine dependence, unspecified, uncomplicated: Secondary | ICD-10-CM | POA: Insufficient documentation

## 2014-03-13 DIAGNOSIS — Y9389 Activity, other specified: Secondary | ICD-10-CM | POA: Insufficient documentation

## 2014-03-13 DIAGNOSIS — X503XXA Overexertion from repetitive movements, initial encounter: Secondary | ICD-10-CM | POA: Insufficient documentation

## 2014-03-13 DIAGNOSIS — S43429A Sprain of unspecified rotator cuff capsule, initial encounter: Secondary | ICD-10-CM | POA: Insufficient documentation

## 2014-03-13 DIAGNOSIS — Y9289 Other specified places as the place of occurrence of the external cause: Secondary | ICD-10-CM | POA: Insufficient documentation

## 2014-03-13 DIAGNOSIS — Z79899 Other long term (current) drug therapy: Secondary | ICD-10-CM | POA: Insufficient documentation

## 2014-03-13 DIAGNOSIS — Z791 Long term (current) use of non-steroidal anti-inflammatories (NSAID): Secondary | ICD-10-CM | POA: Insufficient documentation

## 2014-03-13 DIAGNOSIS — Z792 Long term (current) use of antibiotics: Secondary | ICD-10-CM | POA: Insufficient documentation

## 2014-03-13 DIAGNOSIS — M75100 Unspecified rotator cuff tear or rupture of unspecified shoulder, not specified as traumatic: Secondary | ICD-10-CM

## 2014-03-13 DIAGNOSIS — Z8709 Personal history of other diseases of the respiratory system: Secondary | ICD-10-CM | POA: Insufficient documentation

## 2014-03-13 DIAGNOSIS — Z8739 Personal history of other diseases of the musculoskeletal system and connective tissue: Secondary | ICD-10-CM | POA: Insufficient documentation

## 2014-03-13 MED ORDER — HYDROCODONE-ACETAMINOPHEN 5-325 MG PO TABS
2.0000 | ORAL_TABLET | ORAL | Status: DC | PRN
Start: 2014-03-13 — End: 2014-03-22

## 2014-03-13 MED ORDER — IBUPROFEN 800 MG PO TABS: 800.0000 mg | ORAL_TABLET | Freq: Three times a day (TID) | ORAL | Status: DC

## 2014-03-13 NOTE — ED Notes (Signed)
Helping pull a transmission out yesterday and felt a "pop" in left shoulder.  C/o increased pain.

## 2014-03-13 NOTE — ED Provider Notes (Signed)
CSN: [id number]     Arrival date & time 03/13/14  0911 History   First MD Initiated Contact with Patient 03/13/14 [phone]     Chief Complaint  Patient presents with  . Shoulder Pain      HPI  Patient presents after an arm injury. He was laying underneath the truck on his back and pain to help her friend remove a large transmission. He had a transmission in his hand on the left side. He was on his back. His shoulder was adducted,  His elbow was flexed. Essentially holding transmission in his hand about her level just passes left shoulder. The weighted transmission came down. He cannot support completely. He was turned a bit to the right. He had a hyper external rotation injury to his shoulder and felt a pop and pain in the anterior aspect of the shoulder.  Past Medical History  Diagnosis Date  . Arthritis   . Bronchitis    Past Surgical History  Procedure Laterality Date  . Total hip arthroplasty     No family history on file. History  Substance Use Topics  . Smoking status: Current Every Day Smoker -- 0.50 packs/day    Types: Cigarettes  . Smokeless tobacco: Not on file  . Alcohol Use: Yes    Review of Systems  Musculoskeletal:       He has been left shoulder. Pain with elevating and rotating the arm. No pain in the chest or pectoral. No pain in the neck or scapular back. No pain distal to the shoulder at the elbow or wrist.      Allergies  Review of patient's allergies indicates no known allergies.  Home Medications   Prior to Admission medications   Medication Sig Start Date End Date Taking? Authorizing Provider  albuterol (PROVENTIL HFA;VENTOLIN HFA) 108 (90 BASE) MCG/ACT inhaler Inhale 2 puffs into the lungs every 6 (six) hours as needed for wheezing.    Historical Provider, MD  HYDROcodone-acetaminophen (NORCO/VICODIN) 5-325 MG per tablet Take 1 tablet by mouth every 4 (four) hours as needed. 05/02/13   Hope Bunnie Pion, NP  HYDROcodone-acetaminophen (NORCO/VICODIN) 5-325  MG per tablet Take 2 tablets by mouth every 4 (four) hours as needed. 03/13/14   Tanna Furry, MD  ibuprofen (ADVIL,MOTRIN) 800 MG tablet Take 1 tablet (800 mg total) by mouth 3 (three) times daily. 03/13/14   Tanna Furry, MD  Multiple Vitamin (MULTIVITAMIN WITH MINERALS) TABS Take 1 tablet by mouth daily.    Historical Provider, MD  oxyCODONE-acetaminophen (PERCOCET) 5-325 MG per tablet Take 2 tablets by mouth every 4 (four) hours as needed. 10/21/13   Veryl Speak, MD  sulfamethoxazole-trimethoprim (SEPTRA DS) 800-160 MG per tablet Take 1 tablet by mouth every 12 (twelve) hours. 05/02/13   Hope Bunnie Pion, NP  tiotropium (SPIRIVA) 18 MCG inhalation capsule Place 18 mcg into inhaler and inhale daily.    Historical Provider, MD   BP 154/84  Pulse 88  Temp(Src) 98 F (36.7 C) (Oral)  Resp 20  Ht 5\' 6"  (1.676 m)  Wt 240 lb (108.863 kg)  BMI 38.76 kg/m2  SpO2 92% Physical Exam  Musculoskeletal:  Holds his left arm at his side. He is a painful ARC starting at 45. He has pain to active external rotation or passive internal rotation. Biceps resistance she delivers no pain. Pain is subacromial. No pain in the supraspinatus or infraspinatus fossa posteriorly. No ecchymosis.    ED Course  Procedures (including critical care time) Labs Review Labs Reviewed -  No data to display  Imaging Review No results found.   EKG Interpretation None      MDM   Final diagnoses:  Rotator cuff tear   Exam consistent with rotator cuff injury or strain. Place a sling. Ice. Ibuprofen, Vicodin, followup with orthopedic surgery 5-7 days if not getting improvement in pain and range of motion.    Tanna Furry, MD 03/13/14 [phone]

## 2014-03-13 NOTE — ED Notes (Signed)
MD at bedside. 

## 2014-03-13 NOTE — Discharge Instructions (Signed)
Wear the sling until your pain is improving. Plan ice to the painful area to 3 times per day for 20 minutes at a time As your pain improves very slowly increase use of the arm and avoid overhead lifting. If you're still painful and limited use with your arm in 7 days call Dr. Aline Brochure for an appointment.  Rotator Cuff Injury Rotator cuff injury is any type of injury to the set of muscles and tendons that make up the stabilizing unit of your shoulder. This unit holds the ball of your upper arm bone (humerus) in the socket of your shoulder blade (scapula).  CAUSES Injuries to your rotator cuff most commonly come from sports or activities that cause your arm to be moved repeatedly over your head. Examples of this include throwing, weight lifting, swimming, or racquet sports. Long lasting (chronic) irritation of your rotator cuff can cause soreness and swelling (inflammation), bursitis, and eventual damage to your tendons, such as a tear (rupture). SIGNS AND SYMPTOMS Acute rotator cuff tear:  Sudden tearing sensation followed by severe pain shooting from your upper shoulder down your arm toward your elbow.  Decreased range of motion of your shoulder because of pain and muscle spasm.  Severe pain.  Inability to raise your arm out to the side because of pain and loss of muscle power (large tears). Chronic rotator cuff tear:  Pain that usually is worse at night and may interfere with sleep.  Gradual weakness and decreased shoulder motion as the pain worsens.  Decreased range of motion. Rotator cuff tendinitis:  Deep ache in your shoulder and the outside upper arm over your shoulder.  Pain that comes on gradually and becomes worse when lifting your arm to the side or turning it inward. DIAGNOSIS Rotator cuff injury is diagnosed through a medical history, physical exam, and imaging exam. The medical history helps determine the type of rotator cuff injury. Your health care provider will look  at your injured shoulder, feel the injured area, and ask you to move your shoulder in different positions. X-ray exams typically are done to rule out other causes of shoulder pain, such as fractures. MRI is the exam of choice for the most severe shoulder injuries because the images show muscles and tendons.  TREATMENT  Chronic tear:  Medicine for pain, such as acetaminophen or ibuprofen.  Physical therapy and range-of-motion exercises may be helpful in maintaining shoulder function and strength.  Steroid injections into your shoulder joint.  Surgical repair of the rotator cuff if the injury does not heal with noninvasive treatment. Acute tear:  Anti-inflammatory medicines such as ibuprofen and naproxen to help reduce pain and swelling.  A sling to help support your arm and rest your rotator cuff muscles. Long-term use of a sling is not advised. It may cause significant stiffening of the shoulder joint.  Surgery may be considered within a few weeks, especially in younger, active people, to return the shoulder to full function.  Indications for surgical treatment include the following:  Age younger than 25 years.  Rotator cuff tears that are complete.  Physical therapy, rest, and anti-inflammatory medicines have been used for 6 8 weeks, with no improvement.  Employment or sporting activity that requires constant shoulder use. Tendinitis:  Anti-inflammatory medicines such as ibuprofen and naproxen to help reduce pain and swelling.  A sling to help support your arm and rest your rotator cuff muscles. Long-term use of a sling is not advised. It may cause significant stiffening of the shoulder  joint.  Severe tendinitis may require:  Steroid injections into your shoulder joint.  Physical therapy.  Surgery. HOME CARE INSTRUCTIONS   Apply ice to your injury:  Put ice in a plastic bag.  Place a towel between your skin and the bag.  Leave the ice on for 20 minutes, 2 3 times a  day.  If you have a shoulder immobilizer (sling and straps), wear it until told otherwise by your health care provider.  You may want to sleep on several pillows or in a recliner at night to lessen swelling and pain.  Only take over-the-counter or prescription medicines for pain, discomfort, or fever as directed by your health care provider.  Do simple hand squeezing exercises with a soft rubber ball to decrease hand swelling. SEEK MEDICAL CARE IF:   Your shoulder pain increases, or new pain or numbness develops in your arm, hand, or fingers.  Your hand or fingers are colder than your other hand. SEEK IMMEDIATE MEDICAL CARE IF:   Your arm, hand, or fingers are numb or tingling.  Your arm, hand, or fingers are increasingly swollen and painful, or they turn white or blue. MAKE SURE YOU:  Understand these instructions.  Will watch your condition.  Will get help right away if you are not doing well or get worse. Document Released: 10/13/2000 Document Revised: 08/06/2013 Document Reviewed: 05/28/2013 [facility] Patient Information 2014 [place].

## 2014-03-16 ENCOUNTER — Telehealth: Payer: Self-pay | Admitting: Orthopedic Surgery

## 2014-03-16 NOTE — Telephone Encounter (Signed)
Patient has called earlier today to request appointment following [facility] visit for problem with left shoulder / rotator cuff.  Per note entered by Emergency room physician, patient is to follow up in 5 to 7 days if no better (date of injury and visit:  03/13/14).  I discussed appointment scheduling with patient; appears to need referrals from primary care prior to scheduling.  Further researching [employer] (primary) plan for referral requirement; patient's secondary, [place] Access Medicaid does require referral.  Patient aware; appointment pending referral(s).  His ph# is [phone].

## 2014-03-18 NOTE — Telephone Encounter (Signed)
I tried calling back to patient 03/17/14 to relay that both his primary insurance and his secondary insurance require referral from primary care physician.  I also confirmed with [employer] representative. 03/18/14 - patient called back - relayed this information; he will contact primary care doctor for referrals, which are required for scheduling of appointment with any specialist.

## 2014-03-22 ENCOUNTER — Emergency Department (HOSPITAL_COMMUNITY)
Admission: EM | Admit: 2014-03-22 | Discharge: 2014-03-22 | Disposition: A | Discharge status: Home / Self Care | Payer: Medicare PPO | Attending: Emergency Medicine | Admitting: Emergency Medicine

## 2014-03-22 ENCOUNTER — Encounter (HOSPITAL_COMMUNITY): Payer: Self-pay | Admitting: Emergency Medicine

## 2014-03-22 DIAGNOSIS — J449 Chronic obstructive pulmonary disease, unspecified: Secondary | ICD-10-CM | POA: Insufficient documentation

## 2014-03-22 DIAGNOSIS — M25519 Pain in unspecified shoulder: Secondary | ICD-10-CM | POA: Insufficient documentation

## 2014-03-22 DIAGNOSIS — Z79899 Other long term (current) drug therapy: Secondary | ICD-10-CM | POA: Insufficient documentation

## 2014-03-22 DIAGNOSIS — M129 Arthropathy, unspecified: Secondary | ICD-10-CM | POA: Insufficient documentation

## 2014-03-22 DIAGNOSIS — J4489 Other specified chronic obstructive pulmonary disease: Secondary | ICD-10-CM | POA: Insufficient documentation

## 2014-03-22 DIAGNOSIS — F17200 Nicotine dependence, unspecified, uncomplicated: Secondary | ICD-10-CM | POA: Insufficient documentation

## 2014-03-22 HISTORY — DX: Chronic obstructive pulmonary disease, unspecified: J44.9

## 2014-03-22 MED ORDER — IBUPROFEN 600 MG PO TABS: 600.0000 mg | ORAL_TABLET | Freq: Four times a day (QID) | ORAL | Status: DC | PRN

## 2014-03-22 MED ORDER — OXYCODONE-ACETAMINOPHEN 5-325 MG PO TABS
2.0000 | ORAL_TABLET | ORAL | Status: DC | PRN
Start: 2014-03-22 — End: 2014-12-18

## 2014-03-22 NOTE — Discharge Instructions (Signed)
Followup with your primary care Dr. Shoulder Pain The shoulder is the joint that connects your arms to your body. The bones that form the shoulder joint include the upper arm bone (humerus), the shoulder blade (scapula), and the collarbone (clavicle). The top of the humerus is shaped like a ball and fits into a rather flat socket on the scapula (glenoid cavity). A combination of muscles and strong, fibrous tissues that connect muscles to bones (tendons) support your shoulder joint and hold the ball in the socket. Small, fluid-filled sacs (bursae) are located in different areas of the joint. They act as cushions between the bones and the overlying soft tissues and help reduce friction between the gliding tendons and the bone as you move your arm. Your shoulder joint allows a wide range of motion in your arm. This range of motion allows you to do things like scratch your back or throw a ball. However, this range of motion also makes your shoulder more prone to pain from overuse and injury. Causes of shoulder pain can originate from both injury and overuse and usually can be grouped in the following four categories:  Redness, swelling, and pain (inflammation) of the tendon (tendinitis) or the bursae (bursitis).  Instability, such as a dislocation of the joint.  Inflammation of the joint (arthritis).  Broken bone (fracture). HOME CARE INSTRUCTIONS   Apply ice to the sore area.  Put ice in a plastic bag.  Place a towel between your skin and the bag.  Leave the ice on for 15-20 minutes, 03-04 times per day for the first 2 days.  Stop using cold packs if they do not help with the pain.  If you have a shoulder sling or immobilizer, wear it as long as your caregiver instructs. Only remove it to shower or bathe. Move your arm as little as possible, but keep your hand moving to prevent swelling.  Squeeze a soft ball or foam pad as much as possible to help prevent swelling.  Only take  over-the-counter or prescription medicines for pain, discomfort, or fever as directed by your caregiver. SEEK MEDICAL CARE IF:   Your shoulder pain increases, or new pain develops in your arm, hand, or fingers.  Your hand or fingers become cold and numb.  Your pain is not relieved with medicines. SEEK IMMEDIATE MEDICAL CARE IF:   Your arm, hand, or fingers are numb or tingling.  Your arm, hand, or fingers are significantly swollen or turn white or blue. MAKE SURE YOU:   Understand these instructions.  Will watch your condition.  Will get help right away if you are not doing well or get worse. Document Released: 07/26/2005 Document Revised: 07/10/2012 Document Reviewed: 09/30/2011 [facility] Patient Information 2014 [place].

## 2014-03-22 NOTE — ED Provider Notes (Signed)
CSN: [id number]     Arrival date & time 03/22/14  1502 History   First MD Initiated Contact with Patient 03/22/14 1540     Chief Complaint  Patient presents with  . Numbness     (Consider location/radiation/quality/duration/timing/severity/associated sxs/prior Treatment) The history is provided by the patient.   Patient here with continued left shoulder pain after she sustained injury 9 days ago. He was told to followup with an orthopedist but was unable to do this. Now complains of some intermittent paresthesias to his left hand. Denies any wrist drop. Pain characterized as sharp and localized to the anterior portion of his deltoid. No weakness appreciated. Symptoms are worse with use and better with rest. Denies any new injuries. Past Medical History  Diagnosis Date  . Arthritis   . Bronchitis   . COPD (chronic obstructive pulmonary disease)    Past Surgical History  Procedure Laterality Date  . Total hip arthroplasty     No family history on file. History  Substance Use Topics  . Smoking status: Current Every Day Smoker -- 0.50 packs/day    Types: Cigarettes  . Smokeless tobacco: Not on file  . Alcohol Use: Yes     Comment: "not often" drank yesterday    Review of Systems  All other systems reviewed and are negative.     Allergies  Review of patient's allergies indicates no known allergies.  Home Medications   Prior to Admission medications   Medication Sig Start Date End Date Taking? Authorizing Provider  albuterol (PROVENTIL HFA;VENTOLIN HFA) 108 (90 BASE) MCG/ACT inhaler Inhale 2 puffs into the lungs every 6 (six) hours as needed for wheezing.    Historical Provider, MD  HYDROcodone-acetaminophen (NORCO/VICODIN) 5-325 MG per tablet Take 2 tablets by mouth every 4 (four) hours as needed. 03/13/14   Tanna Furry, MD  ibuprofen (ADVIL,MOTRIN) 600 MG tablet Take 1 tablet (600 mg total) by mouth every 6 (six) hours as needed. 03/22/14   Leota Jacobsen, MD  ibuprofen  (ADVIL,MOTRIN) 800 MG tablet Take 1 tablet (800 mg total) by mouth 3 (three) times daily. 03/13/14   Tanna Furry, MD  oxyCODONE-acetaminophen (PERCOCET/ROXICET) 5-325 MG per tablet Take 2 tablets by mouth every 4 (four) hours as needed for severe pain. 03/22/14   Leota Jacobsen, MD  tiotropium (SPIRIVA) 18 MCG inhalation capsule Place 18 mcg into inhaler and inhale daily.    Historical Provider, MD  traMADol (ULTRAM) 50 MG tablet Take 1 tablet by mouth 4 (four) times daily as needed. 01/24/14   Historical Provider, MD   BP 175/88  Pulse 86  Temp(Src) 98.7 F (37.1 C) (Oral)  Resp 16  Ht 5\' 6"  (1.676 m)  Wt 240 lb (108.863 kg)  BMI 38.76 kg/m2  SpO2 92% Physical Exam  Nursing note and vitals reviewed. Constitutional: He is oriented to person, place, and time. He appears well-developed and well-nourished.  Non-toxic appearance.  HENT:  Head: Normocephalic and atraumatic.  Eyes: Conjunctivae are normal. Pupils are equal, round, and reactive to light.  Neck: Normal range of motion.  Cardiovascular: Normal rate.   Pulmonary/Chest: Effort normal.  Musculoskeletal:       Arms: Neurological: He is alert and oriented to person, place, and time. He displays no atrophy. No cranial nerve deficit or sensory deficit. He exhibits normal muscle tone. GCS eye subscore is 4. GCS verbal subscore is 5. GCS motor subscore is 6.  Median, ulnar, radial nerve intact on left hand. Radial pulse 2+ no wrist drop  appreciated. Skin normal. Normal sensation to left fingers the  Skin: Skin is warm and dry.  Psychiatric: He has a normal mood and affect.    ED Course  Procedures (including critical care time) Labs Review Labs Reviewed - No data to display  Imaging Review No results found.   EKG Interpretation None      MDM   Final diagnoses:  Shoulder pain    Patient to be placed on NSAIDs as well as given analgesics. Encouraged to followup with his Dr.    Leota Jacobsen, MD 03/22/14 [phone]

## 2014-03-22 NOTE — ED Notes (Signed)
Pt states he was seen here last week for left rotator cuff injury. States numbness to left hand began 3 days later. O2 sats of 91-92% in triage.

## 2014-04-08 NOTE — Telephone Encounter (Signed)
Awaiting referrals, as noted, which both of patient's insurers require.  Patient aware, we'll be glad to schedule upon receipt of referrals.

## 2014-12-16 ENCOUNTER — Emergency Department (HOSPITAL_COMMUNITY): Payer: Medicare PPO

## 2014-12-16 ENCOUNTER — Inpatient Hospital Stay (HOSPITAL_COMMUNITY)
Admission: EM | Admit: 2014-12-16 | Discharge: 2014-12-18 | DRG: 190 | Disposition: A | Discharge status: Home / Self Care | Payer: Medicare PPO | Attending: Family Medicine | Admitting: Family Medicine

## 2014-12-16 ENCOUNTER — Encounter (HOSPITAL_COMMUNITY): Payer: Self-pay | Admitting: Emergency Medicine

## 2014-12-16 DIAGNOSIS — Z96649 Presence of unspecified artificial hip joint: Secondary | ICD-10-CM | POA: Diagnosis present

## 2014-12-16 DIAGNOSIS — F17210 Nicotine dependence, cigarettes, uncomplicated: Secondary | ICD-10-CM | POA: Diagnosis present

## 2014-12-16 DIAGNOSIS — R0600 Dyspnea, unspecified: Secondary | ICD-10-CM

## 2014-12-16 DIAGNOSIS — E669 Obesity, unspecified: Secondary | ICD-10-CM | POA: Diagnosis present

## 2014-12-16 DIAGNOSIS — I10 Essential (primary) hypertension: Secondary | ICD-10-CM | POA: Diagnosis present

## 2014-12-16 DIAGNOSIS — J441 Chronic obstructive pulmonary disease with (acute) exacerbation: Secondary | ICD-10-CM | POA: Diagnosis not present

## 2014-12-16 DIAGNOSIS — J9620 Acute and chronic respiratory failure, unspecified whether with hypoxia or hypercapnia: Secondary | ICD-10-CM | POA: Diagnosis present

## 2014-12-16 DIAGNOSIS — M1990 Unspecified osteoarthritis, unspecified site: Secondary | ICD-10-CM | POA: Diagnosis present

## 2014-12-16 DIAGNOSIS — Z6838 Body mass index (BMI) 38.0-38.9, adult: Secondary | ICD-10-CM

## 2014-12-16 DIAGNOSIS — Z79899 Other long term (current) drug therapy: Secondary | ICD-10-CM

## 2014-12-16 DIAGNOSIS — F17200 Nicotine dependence, unspecified, uncomplicated: Secondary | ICD-10-CM | POA: Diagnosis present

## 2014-12-16 DIAGNOSIS — J9601 Acute respiratory failure with hypoxia: Secondary | ICD-10-CM | POA: Diagnosis present

## 2014-12-16 HISTORY — DX: Essential (primary) hypertension: I10

## 2014-12-16 LAB — BASIC METABOLIC PANEL
ANION GAP: 5 (ref 5–15)
BUN: 17 mg/dL (ref 6–23)
CHLORIDE: 106 mmol/L (ref 96–112)
CO2: 27 mmol/L (ref 19–32)
CREATININE: 1.04 mg/dL (ref 0.50–1.35)
Calcium: 9 mg/dL (ref 8.4–10.5)
GFR calc non Af Amer: 80 mL/min — ABNORMAL LOW (ref >90)
Glucose, Bld: 104 mg/dL — ABNORMAL HIGH (ref 70–99)
Potassium: 3.8 mmol/L (ref 3.5–5.1)
SODIUM: 138 mmol/L (ref 135–145)

## 2014-12-16 LAB — CBC WITH DIFFERENTIAL/PLATELET
BASOS ABS: 0 10*3/uL (ref 0.0–0.1)
BASOS PCT: 0 % (ref 0–1)
EOS ABS: 0.1 10*3/uL (ref 0.0–0.7)
EOS PCT: 0 % (ref 0–5)
HEMATOCRIT: 52.6 % — AB (ref 39.0–52.0)
HEMOGLOBIN: 17.7 g/dL — AB (ref 13.0–17.0)
Lymphocytes Relative: 27 % (ref 12–46)
Lymphs Abs: 3.9 10*3/uL (ref 0.7–4.0)
MCH: 31.3 pg (ref 26.0–34.0)
MCHC: 33.7 g/dL (ref 30.0–36.0)
MCV: 93.1 fL (ref 78.0–100.0)
MONOS PCT: 10 % (ref 3–12)
Monocytes Absolute: 1.5 10*3/uL — ABNORMAL HIGH (ref 0.1–1.0)
NEUTROS PCT: 63 % (ref 43–77)
Neutro Abs: 9.2 10*3/uL — ABNORMAL HIGH (ref 1.7–7.7)
PLATELETS: 223 10*3/uL (ref 150–400)
RBC: 5.65 MIL/uL (ref 4.22–5.81)
RDW: 13.1 % (ref 11.5–15.5)
WBC: 14.6 10*3/uL — ABNORMAL HIGH (ref 4.0–10.5)

## 2014-12-16 MED ORDER — IPRATROPIUM BROMIDE 0.02 % IN SOLN
1.0000 mg | Freq: Once | RESPIRATORY_TRACT | Status: AC
  Administered 2014-12-16: 1 mg via RESPIRATORY_TRACT
  Filled 2014-12-16: qty 5

## 2014-12-16 MED ORDER — METHYLPREDNISOLONE SODIUM SUCC 125 MG IJ SOLR
125.0000 mg | Freq: Once | INTRAMUSCULAR | Status: AC
  Administered 2014-12-16: 125 mg via INTRAVENOUS
  Filled 2014-12-16: qty 2

## 2014-12-16 MED ORDER — IPRATROPIUM-ALBUTEROL 0.5-2.5 (3) MG/3ML IN SOLN
3.0000 mL | Freq: Once | RESPIRATORY_TRACT | Status: AC
  Administered 2014-12-16: 3 mL via RESPIRATORY_TRACT
  Filled 2014-12-16: qty 3

## 2014-12-16 MED ORDER — ALBUTEROL (5 MG/ML) CONTINUOUS INHALATION SOLN
10.0000 mg/h | INHALATION_SOLUTION | Freq: Once | RESPIRATORY_TRACT | Status: AC
  Administered 2014-12-16: 10 mg/h via RESPIRATORY_TRACT
  Filled 2014-12-16: qty 20

## 2014-12-16 NOTE — ED Notes (Signed)
Pt c/o sob for a few days getting worse today. Pt received atrovent and albuterol tx enroute.

## 2014-12-17 ENCOUNTER — Encounter (HOSPITAL_COMMUNITY): Payer: Self-pay | Admitting: Internal Medicine

## 2014-12-17 DIAGNOSIS — E669 Obesity, unspecified: Secondary | ICD-10-CM | POA: Diagnosis present

## 2014-12-17 DIAGNOSIS — Z96649 Presence of unspecified artificial hip joint: Secondary | ICD-10-CM | POA: Diagnosis present

## 2014-12-17 DIAGNOSIS — R0600 Dyspnea, unspecified: Secondary | ICD-10-CM

## 2014-12-17 DIAGNOSIS — Z79899 Other long term (current) drug therapy: Secondary | ICD-10-CM | POA: Diagnosis not present

## 2014-12-17 DIAGNOSIS — Z6838 Body mass index (BMI) 38.0-38.9, adult: Secondary | ICD-10-CM | POA: Diagnosis not present

## 2014-12-17 DIAGNOSIS — J9620 Acute and chronic respiratory failure, unspecified whether with hypoxia or hypercapnia: Secondary | ICD-10-CM | POA: Diagnosis present

## 2014-12-17 DIAGNOSIS — F17200 Nicotine dependence, unspecified, uncomplicated: Secondary | ICD-10-CM | POA: Diagnosis present

## 2014-12-17 DIAGNOSIS — F17210 Nicotine dependence, cigarettes, uncomplicated: Secondary | ICD-10-CM | POA: Diagnosis present

## 2014-12-17 DIAGNOSIS — I10 Essential (primary) hypertension: Secondary | ICD-10-CM | POA: Diagnosis present

## 2014-12-17 DIAGNOSIS — J9601 Acute respiratory failure with hypoxia: Secondary | ICD-10-CM

## 2014-12-17 DIAGNOSIS — M1990 Unspecified osteoarthritis, unspecified site: Secondary | ICD-10-CM | POA: Diagnosis present

## 2014-12-17 DIAGNOSIS — J441 Chronic obstructive pulmonary disease with (acute) exacerbation: Secondary | ICD-10-CM | POA: Diagnosis present

## 2014-12-17 DIAGNOSIS — Z720 Tobacco use: Secondary | ICD-10-CM

## 2014-12-17 LAB — CBC
HEMATOCRIT: 51.3 % (ref 39.0–52.0)
Hemoglobin: 16.9 g/dL (ref 13.0–17.0)
MCH: 30.8 pg (ref 26.0–34.0)
MCHC: 32.9 g/dL (ref 30.0–36.0)
MCV: 93.4 fL (ref 78.0–100.0)
Platelets: 219 10*3/uL (ref 150–400)
RBC: 5.49 MIL/uL (ref 4.22–5.81)
RDW: 13.2 % (ref 11.5–15.5)
WBC: 14.2 10*3/uL — ABNORMAL HIGH (ref 4.0–10.5)

## 2014-12-17 LAB — BASIC METABOLIC PANEL
Anion gap: 9 (ref 5–15)
BUN: 16 mg/dL (ref 6–23)
CHLORIDE: 103 mmol/L (ref 96–112)
CO2: 26 mmol/L (ref 19–32)
Calcium: 9.3 mg/dL (ref 8.4–10.5)
Creatinine, Ser: 1 mg/dL (ref 0.50–1.35)
GFR calc Af Amer: >90 mL/min (ref >90)
GFR calc non Af Amer: 84 mL/min — ABNORMAL LOW (ref >90)
GLUCOSE: 170 mg/dL — AB (ref 70–99)
POTASSIUM: 4.1 mmol/L (ref 3.5–5.1)
Sodium: 138 mmol/L (ref 135–145)

## 2014-12-17 MED ORDER — ALBUTEROL SULFATE (2.5 MG/3ML) 0.083% IN NEBU: 2.5000 mg | INHALATION_SOLUTION | RESPIRATORY_TRACT | Status: DC | PRN

## 2014-12-17 MED ORDER — ALBUTEROL SULFATE (2.5 MG/3ML) 0.083% IN NEBU
2.5000 mg | INHALATION_SOLUTION | Freq: Four times a day (QID) | RESPIRATORY_TRACT | Status: DC
  Administered 2014-12-17 (×2): 2.5 mg via RESPIRATORY_TRACT
  Filled 2014-12-17: qty 3

## 2014-12-17 MED ORDER — METHYLPREDNISOLONE SODIUM SUCC 125 MG IJ SOLR
60.0000 mg | Freq: Two times a day (BID) | INTRAMUSCULAR | Status: DC
  Administered 2014-12-17: 60 mg via INTRAVENOUS
  Filled 2014-12-17: qty 2

## 2014-12-17 MED ORDER — CETYLPYRIDINIUM CHLORIDE 0.05 % MT LIQD
7.0000 mL | Freq: Two times a day (BID) | OROMUCOSAL | Status: DC
  Administered 2014-12-17 – 2014-12-18 (×4): 7 mL via OROMUCOSAL

## 2014-12-17 MED ORDER — PREDNISONE 20 MG PO TABS
60.0000 mg | ORAL_TABLET | Freq: Every day | ORAL | Status: DC
  Administered 2014-12-18: 60 mg via ORAL
  Filled 2014-12-17: qty 3

## 2014-12-17 MED ORDER — ONDANSETRON HCL 4 MG/2ML IJ SOLN: 4.0000 mg | Freq: Four times a day (QID) | INTRAMUSCULAR | Status: DC | PRN

## 2014-12-17 MED ORDER — SODIUM CHLORIDE 0.9 % IV SOLN: 250.0000 mL | INTRAVENOUS | Status: DC | PRN

## 2014-12-17 MED ORDER — ACETAMINOPHEN 650 MG RE SUPP: 650.0000 mg | Freq: Four times a day (QID) | RECTAL | Status: DC | PRN

## 2014-12-17 MED ORDER — SODIUM CHLORIDE 0.9 % IJ SOLN
3.0000 mL | Freq: Two times a day (BID) | INTRAMUSCULAR | Status: DC
  Administered 2014-12-18: 3 mL via INTRAVENOUS

## 2014-12-17 MED ORDER — IPRATROPIUM-ALBUTEROL 0.5-2.5 (3) MG/3ML IN SOLN: 3.0000 mL | Freq: Four times a day (QID) | RESPIRATORY_TRACT | Status: DC | PRN

## 2014-12-17 MED ORDER — ALBUTEROL SULFATE (2.5 MG/3ML) 0.083% IN NEBU
2.5000 mg | INHALATION_SOLUTION | RESPIRATORY_TRACT | Status: AC
  Administered 2014-12-17 (×2): 2.5 mg via RESPIRATORY_TRACT
  Filled 2014-12-17 (×3): qty 3

## 2014-12-17 MED ORDER — ONDANSETRON HCL 4 MG PO TABS: 4.0000 mg | ORAL_TABLET | Freq: Four times a day (QID) | ORAL | Status: DC | PRN

## 2014-12-17 MED ORDER — ENOXAPARIN SODIUM 60 MG/0.6ML ~~LOC~~ SOLN
60.0000 mg | SUBCUTANEOUS | Status: DC
  Administered 2014-12-17 – 2014-12-18 (×2): 60 mg via SUBCUTANEOUS
  Filled 2014-12-17 (×2): qty 0.6

## 2014-12-17 MED ORDER — ALUM & MAG HYDROXIDE-SIMETH 200-200-20 MG/5ML PO SUSP: 30.0000 mL | Freq: Four times a day (QID) | ORAL | Status: DC | PRN

## 2014-12-17 MED ORDER — HYDROCHLOROTHIAZIDE 12.5 MG PO CAPS
12.5000 mg | ORAL_CAPSULE | Freq: Every day | ORAL | Status: DC
  Administered 2014-12-17 – 2014-12-18 (×2): 12.5 mg via ORAL
  Filled 2014-12-17 (×2): qty 1

## 2014-12-17 MED ORDER — ALBUTEROL SULFATE (2.5 MG/3ML) 0.083% IN NEBU
2.5000 mg | INHALATION_SOLUTION | RESPIRATORY_TRACT | Status: DC
  Administered 2014-12-17 – 2014-12-18 (×5): 2.5 mg via RESPIRATORY_TRACT
  Filled 2014-12-17 (×5): qty 3

## 2014-12-17 MED ORDER — SODIUM CHLORIDE 0.9 % IJ SOLN: 3.0000 mL | INTRAMUSCULAR | Status: DC | PRN

## 2014-12-17 MED ORDER — OXYCODONE HCL 5 MG PO TABS: 5.0000 mg | ORAL_TABLET | ORAL | Status: DC | PRN

## 2014-12-17 MED ORDER — LEVOFLOXACIN 750 MG PO TABS
750.0000 mg | ORAL_TABLET | Freq: Every day | ORAL | Status: DC
  Administered 2014-12-17 – 2014-12-18 (×2): 750 mg via ORAL
  Filled 2014-12-17 (×2): qty 1

## 2014-12-17 MED ORDER — ACETAMINOPHEN 325 MG PO TABS: 650.0000 mg | ORAL_TABLET | Freq: Four times a day (QID) | ORAL | Status: DC | PRN

## 2014-12-17 MED ORDER — SODIUM CHLORIDE 0.9 % IJ SOLN
3.0000 mL | Freq: Two times a day (BID) | INTRAMUSCULAR | Status: DC
  Administered 2014-12-17 (×2): 3 mL via INTRAVENOUS

## 2014-12-17 MED ORDER — HYDROMORPHONE HCL 1 MG/ML IJ SOLN: 0.5000 mg | INTRAMUSCULAR | Status: DC | PRN

## 2014-12-17 MED ORDER — NICOTINE 14 MG/24HR TD PT24
14.0000 mg | MEDICATED_PATCH | Freq: Every day | TRANSDERMAL | Status: DC
  Administered 2014-12-17 – 2014-12-18 (×2): 14 mg via TRANSDERMAL
  Filled 2014-12-17 (×2): qty 1

## 2014-12-17 NOTE — Progress Notes (Signed)
Patient requesting a shower, refusing bed bath. Paged on-call MD will follow orders given.

## 2014-12-17 NOTE — ED Notes (Signed)
Report given to [facility] on 300

## 2014-12-17 NOTE — ED Provider Notes (Signed)
CSN: [id number]     Arrival date & time 12/16/14  2032 History   First MD Initiated Contact with Patient 12/16/14 2159     Chief Complaint  Patient presents with  . Shortness of Breath      HPI Pt was seen at 2205.  Per pt, c/o gradual onset and worsening of persistent cough, wheezing and SOB for the past week ,worse over the past several days.  Describes her symptoms as "I think it's my COPD." Pt has been using his home MDI without relief. Pt continues to smoke cigarettes.  Denies CP/palpitations, no back pain, no abd pain, no N/V/D, no fevers, no rash.     Past Medical History  Diagnosis Date  . Arthritis   . Bronchitis   . COPD (chronic obstructive pulmonary disease)    Past Surgical History  Procedure Laterality Date  . Total hip arthroplasty      History  Substance Use Topics  . Smoking status: Current Every Day Smoker -- 0.50 packs/day    Types: Cigarettes  . Smokeless tobacco: Not on file  . Alcohol Use: Yes     Comment: "not often" drank yesterday    Review of Systems ROS: Statement: All systems negative except as marked or noted in the HPI; Constitutional: Negative for fever and chills. ; ; Eyes: Negative for eye pain, redness and discharge. ; ; ENMT: Negative for ear pain, hoarseness, nasal congestion, sinus pressure and sore throat. ; ; Cardiovascular: Negative for chest pain, palpitations, diaphoresis, and peripheral edema. ; ; Respiratory: +SOB, cough, wheezing. Negative for stridor. ; ; Gastrointestinal: Negative for nausea, vomiting, diarrhea, abdominal pain, blood in stool, hematemesis, jaundice and rectal bleeding. . ; ; Genitourinary: Negative for dysuria, flank pain and hematuria. ; ; Musculoskeletal: Negative for back pain and neck pain. Negative for swelling and trauma.; ; Skin: Negative for pruritus, rash, abrasions, blisters, bruising and skin lesion.; ; Neuro: Negative for headache, lightheadedness and neck stiffness. Negative for weakness, altered level of  consciousness , altered mental status, extremity weakness, paresthesias, involuntary movement, seizure and syncope.      Allergies  Review of patient's allergies indicates no known allergies.  Home Medications   Prior to Admission medications   Medication Sig Start Date End Date Taking? Authorizing Provider  albuterol (PROVENTIL HFA;VENTOLIN HFA) 108 (90 BASE) MCG/ACT inhaler Inhale 2 puffs into the lungs every 6 (six) hours as needed for wheezing.   Yes Historical Provider, MD  hydrochlorothiazide (MICROZIDE) 12.5 MG capsule Take 1 capsule by mouth daily. 12/02/14  Yes Historical Provider, MD  naproxen sodium (ANAPROX) 220 MG tablet Take 440 mg by mouth daily as needed (for pain).   Yes Historical Provider, MD  SYMBICORT 80-4.5 MCG/ACT inhaler Inhale 2 puffs into the lungs 2 (two) times daily. 12/02/14  Yes Historical Provider, MD  ibuprofen (ADVIL,MOTRIN) 600 MG tablet Take 1 tablet (600 mg total) by mouth every 6 (six) hours as needed. Patient not taking: Reported on 12/16/2014 03/22/14   Leota Jacobsen, MD  oxyCODONE-acetaminophen (PERCOCET/ROXICET) 5-325 MG per tablet Take 2 tablets by mouth every 4 (four) hours as needed for severe pain. Patient not taking: Reported on 12/16/2014 03/22/14   Leota Jacobsen, MD  tiotropium [facility]) 18 MCG inhalation capsule Place 18 mcg into inhaler and inhale daily.    Historical Provider, MD   BP 140/91 mmHg  Pulse 99  Temp(Src) 98.5 F (36.9 C) (Oral)  Resp 22  Ht 5\' 8"  (1.727 m)  Wt 250 lb (113.399  kg)  BMI 38.02 kg/m2  SpO2 94% Physical Exam  2210: Physical examination:  Nursing notes reviewed; Vital signs and O2 SAT reviewed;  Constitutional: Well developed, Well nourished, Well hydrated, Uncomfortable appearing.; Head:  Normocephalic, atraumatic; Eyes: EOMI, PERRL, No scleral icterus; ENMT: Mouth and pharynx normal, Mucous membranes moist; Neck: Supple, Full range of motion, No lymphadenopathy; Cardiovascular: Regular rate and rhythm, No gallop;  Respiratory: Breath sounds coarse & equal bilaterally, insp/exp wheezes bilat with audible wheezing. Sitting upright, speaking in short phrases. Tachypneic.; Chest: Nontender, Movement normal; Abdomen: Soft, Nontender, Nondistended, Normal bowel sounds; Genitourinary: No CVA tenderness; Extremities: Pulses normal, No tenderness, No edema, No calf edema or asymmetry.; Neuro: AA&Ox3, Major CN grossly intact.  Speech clear. No gross focal motor or sensory deficits in extremities.; Skin: Color normal, Warm, Dry.   ED Course  Procedures     EKG Interpretation None      MDM  MDM Reviewed: previous chart, nursing note and vitals Reviewed previous: labs Interpretation: labs and x-ray Total time providing critical care: 30-74 minutes. This excludes time spent performing separately reportable procedures and services. Consults: admitting MD     CRITICAL CARE Performed by: Alfonzo Feller Total critical care time: 35 Critical care time was exclusive of separately billable procedures and treating other patients. Critical care was necessary to treat or prevent imminent or life-threatening deterioration. Critical care was time spent personally by me on the following activities: development of treatment plan with patient and/or surrogate as well as nursing, discussions with consultants, evaluation of patient's response to treatment, examination of patient, obtaining history from patient or surrogate, ordering and performing treatments and interventions, ordering and review of laboratory studies, ordering and review of radiographic studies, pulse oximetry and re-evaluation of patient's condition.  Results for orders placed or performed during the hospital encounter of 96/29/52  Basic metabolic panel  Result Value Ref Range   Sodium 138 135 - 145 mmol/L   Potassium 3.8 3.5 - 5.1 mmol/L   Chloride 106 96 - 112 mmol/L   CO2 27 19 - 32 mmol/L   Glucose, Bld 104 (H) 70 - 99 mg/dL   BUN 17 6 - 23  mg/dL   Creatinine, Ser 1.04 0.50 - 1.35 mg/dL   Calcium 9.0 8.4 - 10.5 mg/dL   GFR calc non Af Amer 80 (L) >90 mL/min   GFR calc Af Amer >90 >90 mL/min   Anion gap 5 5 - 15  CBC with Differential  Result Value Ref Range   WBC 14.6 (H) 4.0 - 10.5 K/uL   RBC 5.65 4.22 - 5.81 MIL/uL   Hemoglobin 17.7 (H) 13.0 - 17.0 g/dL   HCT 52.6 (H) 39.0 - 52.0 %   MCV 93.1 78.0 - 100.0 fL   MCH 31.3 26.0 - 34.0 pg   MCHC 33.7 30.0 - 36.0 g/dL   RDW 13.1 11.5 - 15.5 %   Platelets 223 150 - 400 K/uL   Neutrophils Relative % 63 43 - 77 %   Neutro Abs 9.2 (H) 1.7 - 7.7 K/uL   Lymphocytes Relative 27 12 - 46 %   Lymphs Abs 3.9 0.7 - 4.0 K/uL   Monocytes Relative 10 3 - 12 %   Monocytes Absolute 1.5 (H) 0.1 - 1.0 K/uL   Eosinophils Relative 0 0 - 5 %   Eosinophils Absolute 0.1 0.0 - 0.7 K/uL   Basophils Relative 0 0 - 1 %   Basophils Absolute 0.0 0.0 - 0.1 K/uL   Dg Chest [facility]  1 View 12/16/2014   CLINICAL DATA:  Worsening dyspnea for 3 days, becoming severe tonight.  EXAM: PORTABLE CHEST - 1 VIEW  COMPARISON:  12/17/2011  FINDINGS: The heart size and mediastinal contours are within normal limits. Both lungs are clear. The visualized skeletal structures are unremarkable.  IMPRESSION: No active disease.   Electronically Signed   By: Andreas Newport M.D.   On: 12/16/2014 22:33    0030:  On arrival to ED: Pt sitting upright, audible wheezing, tachypneic with O2 Sats 86% R/A. Hour long neb and IV solumedrol given.  Neb completed with pt's O2 Sats at rest dropping to 91% R/A, and further dropping to 87% R/A with ambulation. Pt c/o increasing SOB and RR. Lungs diminished, faint scattered wheezing. Pt placed back on O2 N/C, will admit. Dx and testing d/w pt.  Questions answered.  Verb understanding, agreeable to admit.  T/C to [facility]. Arnoldo Morale, case discussed, including:  HPI, pertinent PM/SHx, VS/PE, dx testing, ED course and treatment:  Agreeable to admit, requests to write temporary orders, obtain tele bed  to team AP[facility]mits.   Francine Graven, DO 12/19/14 [phone]

## 2014-12-17 NOTE — Care Management Note (Signed)
    Page 1 of 2   12/18/2014     10:56:37 AM CARE MANAGEMENT NOTE 12/18/2014  Patient:  Miguel Marsh, Miguel Marsh   Account Number:  [id number]  Date Initiated:  12/17/2014  Documentation initiated by:  Theophilus Kinds  Subjective/Objective Assessment:   Pt admitted from home with COPD. Pt lives alone and will return home at discharge. Pt is independent with ADl's. Pt uses [facility] for transportation. Pts PCP is Dr. Carollee Herter in [place].     Action/Plan:   Will assess need for home O2 at discharge. ? need for neb machine. Will give pts list of PCP in area accepting new pts as pt wants PCP closer to home.[facility] referral made.   Anticipated DC Date:  12/19/2014   Anticipated DC Plan:  [place]  CM consult      PAC Choice  DURABLE MEDICAL EQUIPMENT   Choice offered to / List presented to:  C-1 Patient   DME arranged  NEBULIZER MACHINE      DME agency  [place]        Status of service:  Completed, signed off Medicare Important Message given?  YES (If response is "NO", the following Medicare IM given date fields will be blank) Date Medicare IM given:  12/18/2014 Medicare IM given by:  Theophilus Kinds Date Additional Medicare IM given:   Additional Medicare IM given by:    Discharge Disposition:  HOME/SELF CARE  Per UR Regulation:    If discussed at Long Length of Stay Meetings, dates discussed:    Comments:  12/18/14 [place], RN BSN CM Pt discharged home today with neb machine with  [employer]. Danielle with [facility] called and aware of referral, and orders faxed to [place]. Neb machine will be delivered to pts home. Pt did not qualify for home O2. No other CM needs noted.  12/17/14 [place], RN BSN CM

## 2014-12-17 NOTE — ED Notes (Signed)
Pt walked around the nurses' station, O2 sat decreased to 85% and HR 102; pt assisted back to bed; Dr. Arnoldo Morale in with pt at this time

## 2014-12-17 NOTE — Progress Notes (Signed)
[facility] PROGRESS NOTE  Miguel Marsh:[id number] DOB: 07/01/1961 DOA: 12/16/2014 PCP: PROVIDER NOT IN SYSTEM  Assessment/Plan: 1. Acute respiratory failure secondary to COPD with exacerbation. Sats 93% on 3L,  Continue oxygen supplementation, nebs and add solumedrol and antibiotic. Will mobilize in the morning for oxygen trial. Monitor closely   Active Problems:  2. COPD with acute exacerbation: not on oxygen at home. Does reports DOE at baseline. Denies ever having PFT. Will add solumedrol. He received one dose in ED and no other steroids. Continue DUONEbs and give levaquin. suspect he has underlying obstructive sleep apnea. May benefit OP testing.     3. Tobacco use disorder:Encouraged to continue to decrease until quit smoking 1/3 Ppd per pt report  4. HTN/LE edema: controlled. Continue home HCTZ  5. Obesity; BMI 38.1. Nutritional consult.    Code Status: full Family Communication: none present Disposition Plan: home    Consultants:  none  Procedures:  none  Antibiotics:  levaquin 12/17/14>>  HPI/Subjective: Sitting on side of the bed reports breathing much easier today . Denies pain or discomfort  Objective: Filed Vitals:   12/17/14 1504  BP: 124/66  Pulse: 89  Temp: 98.3 F (36.8 C)  Resp: 20   No intake or output data in the 24 hours ending 12/17/14 1554 Filed Weights   12/16/14 2037 12/17/14 0609  Weight: 113.399 kg (250 lb) 110.224 kg (243 lb)    Exam:   General:  Obese, appears calm and comfortable  Cardiovascular: Regular rate and rhythm no murmur no gallop no rub trace lower extremity edema  Respiratory: Mild increased work of breathing with repositioning self in bed. Breath sounds are quite diminished. Some faint wheezing noted. Slightly prolonged expiratory phase.  Abdomen: Obese soft positive bowel sounds throughout nontender to palpation no mass organomegaly  Musculoskeletal: No clubbing or cyanosis  Data  Reviewed: Basic Metabolic Panel:  Recent Labs Lab 12/16/14 2220 12/17/14 0543  NA 138 138  K 3.8 4.1  CL 106 103  CO2 27 26  GLUCOSE 104* 170*  BUN 17 16  CREATININE 1.04 1.00  CALCIUM 9.0 9.3   Liver Function Tests: No results for input(s): AST, ALT, ALKPHOS, BILITOT, PROT, ALBUMIN in the last 168 hours. No results for input(s): LIPASE, AMYLASE in the last 168 hours. No results for input(s): AMMONIA in the last 168 hours. CBC:  Recent Labs Lab 12/16/14 2220 12/17/14 0543  WBC 14.6* 14.2*  NEUTROABS 9.2*  --   HGB 17.7* 16.9  HCT 52.6* 51.3  MCV 93.1 93.4  PLT 223 219   Cardiac Enzymes: No results for input(s): CKTOTAL, CKMB, CKMBINDEX, TROPONINI in the last 168 hours. BNP (last 3 results) No results for input(s): BNP in the last 8760 hours.  ProBNP (last 3 results) No results for input(s): PROBNP in the last 8760 hours.  CBG: No results for input(s): GLUCAP in the last 168 hours.  No results found for this or any previous visit (from the past 240 hour(s)).   Studies: Dg Chest Port 1 View  12/16/2014   CLINICAL DATA:  Worsening dyspnea for 3 days, becoming severe tonight.  EXAM: PORTABLE CHEST - 1 VIEW  COMPARISON:  12/17/2011  FINDINGS: The heart size and mediastinal contours are within normal limits. Both lungs are clear. The visualized skeletal structures are unremarkable.  IMPRESSION: No active disease.   Electronically Signed   By: Andreas Newport M.D.   On: 12/16/2014 22:33    Scheduled Meds: . albuterol  2.5 mg Nebulization  Q4H  . antiseptic oral rinse  7 mL Mouth Rinse BID  . enoxaparin (LOVENOX) injection  60 mg Subcutaneous Q24H  . hydrochlorothiazide  12.5 mg Oral Daily  . levofloxacin  750 mg Oral Daily  . methylPREDNISolone (SOLU-MEDROL) injection  60 mg Intravenous Q12H  . sodium chloride  3 mL Intravenous Q12H  . sodium chloride  3 mL Intravenous Q12H   Continuous Infusions:   Principal Problem:   Acute respiratory failure Active  Problems:   COPD with acute exacerbation   Tobacco use disorder   COPD exacerbation   Obesity    Time spent: [place] [place] [facility] Pager [phone]. If 7PM-7AM, please contact night-coverage at [email or web address], password [facility] 12/17/2014, 3:54 PM  LOS: 0 days

## 2014-12-17 NOTE — Progress Notes (Signed)
   12/17/14 1013  Mobility  Activity Ambulate in hall  Level of Assistance Independent  Assistive Device None  Distance Ambulated (ft) 200 ft  Ambulation Response Tolerated well  Patient's O2 saturation is 90% on room air at rest. Patient's O2 saturation is 89% on room air with exertion.

## 2014-12-17 NOTE — H&P (Signed)
[facility] Admission History and Physical       Miguel Marsh NFA:[id number] DOB: 05-Jul-1961 DOA: 12/16/2014  Referring physician: EDP PCP: PROVIDER NOT IN SYSTEM  Specialists:   Chief Complaint: SOB  HPI: Miguel Marsh is a 54 y.o. male with a history of COPD, and Tobacco Use who presents to the ED with complaints of worsening SOB x 1 week worse today.   He denies any fevers or chills. He does report a cough productive of white sputum.   He was found to have hypoxia and on route to the ED he was given a nebulizer treatment, and in the ED he was found to have a O2 saturation of 86% on RA, he was placed on NCO2 at  2 liters with improvement.   He was administered IV Solumedrol, and a continuous nebulizer Rx with mild improvement.  He was referred for admission.      Review of Systems:  Constitutional: No Weight Loss, No Weight Gain, Night Sweats, Fevers, Chills, Dizziness, Light Headedness, Fatigue, or Generalized Weakness HEENT: No Headaches, Difficulty Swallowing,Tooth/Dental Problems,Sore Throat,  No Sneezing, Rhinitis, Ear Ache, Nasal Congestion, or Post Nasal Drip,  Cardio-vascular:  No Chest pain, Orthopnea, PND, Edema in Lower Extremities, Anasarca, Dizziness, Palpitations  Resp:  +Dyspnea, No DOE, +Productive Cough, No Non-Productive Cough, No Hemoptysis, No Wheezing.    GI: No Heartburn, Indigestion, Abdominal Pain, Nausea, Vomiting, Diarrhea, Constipation, Hematemesis, Hematochezia, Melena, Change in Bowel Habits,  Loss of Appetite  GU: No Dysuria, No Change in Color of Urine, No Urgency or Urinary Frequency, No Flank pain.  Musculoskeletal: No Joint Pain or Swelling, No Decreased Range of Motion, No Back Pain.  Neurologic: No Syncope, No Seizures, Muscle Weakness, Paresthesia, Vision Disturbance or Loss, No Diplopia, No Vertigo, No Difficulty Walking,  Skin: No Rash or Lesions. Psych: No Change in Mood or Affect, No Depression or Anxiety, No Memory loss, No  Confusion, or Hallucinations   Past Medical History  Diagnosis Date  . Arthritis   . Bronchitis   . COPD (chronic obstructive pulmonary disease)      Past Surgical History  Procedure Laterality Date  . Total hip arthroplasty        Prior to Admission medications   Medication Sig Start Date End Date Taking? Authorizing Provider  albuterol (PROVENTIL HFA;VENTOLIN HFA) 108 (90 BASE) MCG/ACT inhaler Inhale 2 puffs into the lungs every 6 (six) hours as needed for wheezing.   Yes Historical Provider, MD  hydrochlorothiazide (MICROZIDE) 12.5 MG capsule Take 1 capsule by mouth daily. 12/02/14  Yes Historical Provider, MD  naproxen sodium (ANAPROX) 220 MG tablet Take 440 mg by mouth daily as needed (for pain).   Yes Historical Provider, MD  SYMBICORT 80-4.5 MCG/ACT inhaler Inhale 2 puffs into the lungs 2 (two) times daily. 12/02/14  Yes Historical Provider, MD  ibuprofen (ADVIL,MOTRIN) 600 MG tablet Take 1 tablet (600 mg total) by mouth every 6 (six) hours as needed. Patient not taking: Reported on 12/16/2014 03/22/14   Leota Jacobsen, MD  oxyCODONE-acetaminophen (PERCOCET/ROXICET) 5-325 MG per tablet Take 2 tablets by mouth every 4 (four) hours as needed for severe pain. Patient not taking: Reported on 12/16/2014 03/22/14   Leota Jacobsen, MD  tiotropium [facility]) 18 MCG inhalation capsule Place 18 mcg into inhaler and inhale daily.    Historical Provider, MD     No Known Allergies  Social History:  reports that he has been smoking Cigarettes.  He has been smoking about 0.50 packs  per day. He does not have any smokeless tobacco history on file. He reports that he drinks alcohol. He reports that he uses illicit drugs (Marijuana and Cocaine).    History reviewed. No pertinent family history.     Physical Exam:  GEN: Pleasant Obese  54 y.o. African American male examined and in no acute distress; cooperative with exam Filed Vitals:   12/16/14 2109 12/16/14 2143 12/16/14 2249 12/17/14 0042    BP:  140/91  133/76  Pulse:  99  92  Temp:      TempSrc:      Resp:  22  24  Height:      Weight:      SpO2: 100% 91% 94% 96%   Blood pressure 133/76, pulse 92, temperature 98.5 F (36.9 C), temperature source Oral, resp. rate 24, height 5\' 8"  (1.727 m), weight 113.399 kg (250 lb), SpO2 96 %. PSYCH: He is alert and oriented x4; does not appear anxious does not appear depressed; affect is normal HEENT: Normocephalic and Atraumatic, Mucous membranes pink; PERRLA; EOM intact; Fundi:  Benign;  No scleral icterus, Nares: Patent, Oropharynx: Clear, Edentulous on Upper Palate,    Neck:  FROM, No Cervical Lymphadenopathy nor Thyromegaly or Carotid Bruit; No JVD; Breasts:: Not examined CHEST WALL: No tenderness CHEST: Normal respiration, clear to auscultation bilaterally HEART: Regular rate and rhythm; no murmurs rubs or gallops BACK: No kyphosis or scoliosis; No CVA tenderness ABDOMEN: Positive Bowel Sounds,  Obese, Soft Non-Tender, No Rebound or Guarding; No Masses, No Organomegaly. Rectal Exam: Not done EXTREMITIES: No Cyanosis, Clubbing, or Edema; No Ulcerations. Genitalia: not examined PULSES: 2+ and symmetric SKIN: Normal hydration no rash or ulceration CNS:  Alert and Oriented x 4, No Focal Deficits Vascular: pulses palpable throughout    Labs on Admission:  Basic Metabolic Panel:  Recent Labs Lab 12/16/14 2220  NA 138  K 3.8  CL 106  CO2 27  GLUCOSE 104*  BUN 17  CREATININE 1.04  CALCIUM 9.0   Liver Function Tests: No results for input(s): AST, ALT, ALKPHOS, BILITOT, PROT, ALBUMIN in the last 168 hours. No results for input(s): LIPASE, AMYLASE in the last 168 hours. No results for input(s): AMMONIA in the last 168 hours. CBC:  Recent Labs Lab 12/16/14 2220  WBC 14.6*  NEUTROABS 9.2*  HGB 17.7*  HCT 52.6*  MCV 93.1  PLT 223   Cardiac Enzymes: No results for input(s): CKTOTAL, CKMB, CKMBINDEX, TROPONINI in the last 168 hours.  BNP (last 3 results) No  results for input(s): BNP in the last 8760 hours.  ProBNP (last 3 results) No results for input(s): PROBNP in the last 8760 hours.  CBG: No results for input(s): GLUCAP in the last 168 hours.  Radiological Exams on Admission: Dg Chest Port 1 View  12/16/2014   CLINICAL DATA:  Worsening dyspnea for 3 days, becoming severe tonight.  EXAM: PORTABLE CHEST - 1 VIEW  COMPARISON:  12/17/2011  FINDINGS: The heart size and mediastinal contours are within normal limits. Both lungs are clear. The visualized skeletal structures are unremarkable.  IMPRESSION: No active disease.   Electronically Signed   By: Andreas Newport M.D.   On: 12/16/2014 22:33     EKG: Independently reviewed.     Assessment/Plan:   54 y.o. male with  Principal Problem:   1.   Acute respiratory failure- Due to #2   NCO2 at 2 liters [place] PRN to keep O2 sats > 91%   Monitor O2 sats  Active Problems:   2.   COPD with acute exacerbation   Steroid Taper   DUONEbs   O2       3.   Tobacco use disorder   Encouraged to continue to decrease until quit     Smoking 1/3  Ppd per pt report     4.   DVT Prophylaxis    Lovenox    Code Status:    FULL CODE Family Communication:    No Family Present Disposition Plan:   Inpatient      Time spent:  [place] [facility] Pager [phone]   If [place] Please Contact the Day Rounding Team MD for [facility]  If 7PM-7AM, Please Contact Night-Floor Coverage  [email or web address] Password [email or web address] 12/17/2014, 12:57 AM     ADDENDUM:   Patient was seen and examined on 12/17/2014

## 2014-12-17 NOTE — Progress Notes (Signed)
UR chart review completed.  

## 2014-12-17 NOTE — Progress Notes (Signed)
   12/17/14 1830  Mobility  Activity Ambulate in hall  Level of Assistance Independent  Assistive Device None  Distance Ambulated (ft) 200 ft  Ambulation Response Tolerated fair  Patient's O2 saturation is 91% on room air at rest. Patient's lowest O2 saturation is 87% on room air with exertion.

## 2014-12-18 MED ORDER — ENOXAPARIN SODIUM 40 MG/0.4ML ~~LOC~~ SOLN: 40.0000 mg | SUBCUTANEOUS | Status: DC

## 2014-12-18 MED ORDER — LEVOFLOXACIN 500 MG PO TABS: 500.0000 mg | ORAL_TABLET | Freq: Every day | ORAL | Status: DC

## 2014-12-18 MED ORDER — ALBUTEROL SULFATE (2.5 MG/3ML) 0.083% IN NEBU: 2.5000 mg | INHALATION_SOLUTION | Freq: Four times a day (QID) | RESPIRATORY_TRACT | Status: DC | PRN

## 2014-12-18 MED ORDER — LEVOFLOXACIN 750 MG PO TABS
750.0000 mg | ORAL_TABLET | Freq: Every day | ORAL | Status: DC
Start: 2014-12-18 — End: 2014-12-18

## 2014-12-18 MED ORDER — PREDNISONE 20 MG PO TABS: 60.0000 mg | ORAL_TABLET | Freq: Every day | ORAL | Status: DC

## 2014-12-18 MED ORDER — NICOTINE 14 MG/24HR TD PT24: 14.0000 mg | MEDICATED_PATCH | Freq: Every day | TRANSDERMAL | Status: DC

## 2014-12-18 MED ORDER — MAGNESIUM HYDROXIDE 400 MG/5ML PO SUSP
30.0000 mL | Freq: Every day | ORAL | Status: DC | PRN
  Administered 2014-12-18: 30 mL via ORAL
  Filled 2014-12-18: qty 30

## 2014-12-18 NOTE — Progress Notes (Signed)
SATURATION QUALIFICATIONS: (This note is used to comply with regulatory documentation for home oxygen)  Patient Saturations on Room Air at Rest = 94%  Patient Saturations on Room Air while Ambulating = 92%  Patient Saturations on  Liters of oxygen while Ambulating = Sats remained over 88% so this step not needed  Please briefly explain why patient needs home oxygen: Patient does not qualify for home O2.  The sats did not drop below 91%.

## 2014-12-18 NOTE — Discharge Summary (Signed)
Physician Discharge Summary  Miguel Marsh GNF:[id number] DOB: 1961/07/29 DOA: 12/16/2014  PCP: PROVIDER NOT IN SYSTEM  Admit date: 12/16/2014 Discharge date: 12/18/2014  Time spent: 40 minutes  Recommendations for Outpatient Follow-up:  1. PCP in [employer] 1 week for evaluation of respiratory status. May benefit PFT. Consider sleep apnea evaluation.   Discharge Diagnoses:  Principal Problem:   Acute respiratory failure Active Problems:   COPD with acute exacerbation   Tobacco use disorder   COPD exacerbation   Obesity   Discharge Condition: stable  Diet recommendation: heart healthy  Filed Weights   12/16/14 2037 12/17/14 0609  Weight: 113.399 kg (250 lb) 110.224 kg (243 lb)    History of present illness:  Miguel Marsh is a 54 y.o. male with a history of COPD, and Tobacco Use who presented to the ED on 12/17/14 with complaints of worsening SOB x 1 week worsening. He denied any fevers or chills. He reported a cough productive of white sputum. He was found to have hypoxia and on route to the ED he was given a nebulizer treatment, and in the ED he was found to have a O2 saturation of 86% on RA, he was placed on NCO2 at 2 liters with improvement. He was administered IV Solumedrol, and a continuous nebulizer Rx with mild improvement. He was referred for admission  Hospital Course:  1. Acute respiratory failure secondary to COPD with exacerbation. Admitted and provided with scheduled nebs, steroids and antibiotics. Quickly improved. At discharge sats 92% on room air with ambulation.entation. Will discharge with nebs, prednisone burst and antibiotic for 3 more days to complete 5 day course. Follow up with PCP 1week.   Active Problems:  2. COPD with acute exacerbation: not on oxygen at home. Does reports DOE at baseline. Denied ever having PFT. Nebs, steroids and antibiotic as above. Suspect he has underlying obstructive sleep apnea. May benefit OP testing.     3. Tobacco use disorder:Encouraged to continue to decrease until quit smoking 1/3 Ppd per pt report  4. HTN/LE edema: controlled.   5. Obesity; BMI 38.1. Nutritional consult.   Procedures:  none  Consultations:  none  Discharge Exam: Filed Vitals:   12/18/14 0558  BP: 128/67  Pulse: 94  Temp:   Resp: 18    General: obese appears comfortable  Cardiovascular: RRR no MGR no LE edema Respiratory: normal effort BS diminished in bases, improved air movement mid lobes, no wheeze  Discharge Instructions    Current Discharge Medication List    START taking these medications   Details  levofloxacin (LEVAQUIN) 750 MG tablet Take 1 tablet (750 mg total) by mouth daily. Qty: 3 tablet, Refills: 0    nicotine (NICODERM CQ - DOSED IN MG/24 HOURS) 14 mg/24hr patch Place 1 patch (14 mg total) onto the skin daily. Qty: 28 patch, Refills: 0    predniSONE (DELTASONE) 20 MG tablet Take 3 tablets (60 mg total) by mouth daily before breakfast. For 4 days starting 12/19/14 Qty: 12 tablet, Refills: 0      CONTINUE these medications which have NOT CHANGED   Details  albuterol (PROVENTIL HFA;VENTOLIN HFA) 108 (90 BASE) MCG/ACT inhaler Inhale 2 puffs into the lungs every 6 (six) hours as needed for wheezing.    hydrochlorothiazide (MICROZIDE) 12.5 MG capsule Take 1 capsule by mouth daily. Refills: 5    naproxen sodium (ANAPROX) 220 MG tablet Take 440 mg by mouth daily as needed (for pain).    SYMBICORT 80-4.5 MCG/ACT inhaler Inhale  2 puffs into the lungs 2 (two) times daily. Refills: 11    tiotropium (SPIRIVA) 18 MCG inhalation capsule Place 18 mcg into inhaler and inhale daily.      STOP taking these medications     ibuprofen (ADVIL,MOTRIN) 600 MG tablet      oxyCODONE-acetaminophen (PERCOCET/ROXICET) 5-325 MG per tablet        No Known Allergies    The results of significant diagnostics from this hospitalization (including imaging, microbiology, ancillary and  laboratory) are listed below for reference.    Significant Diagnostic Studies: Dg Chest Port 1 View  12/16/2014   CLINICAL DATA:  Worsening dyspnea for 3 days, becoming severe tonight.  EXAM: PORTABLE CHEST - 1 VIEW  COMPARISON:  12/17/2011  FINDINGS: The heart size and mediastinal contours are within normal limits. Both lungs are clear. The visualized skeletal structures are unremarkable.  IMPRESSION: No active disease.   Electronically Signed   By: Andreas Newport M.D.   On: 12/16/2014 22:33    Microbiology: No results found for this or any previous visit (from the past 240 hour(s)).   Labs: Basic Metabolic Panel:  Recent Labs Lab 12/16/14 2220 12/17/14 0543  NA 138 138  K 3.8 4.1  CL 106 103  CO2 27 26  GLUCOSE 104* 170*  BUN 17 16  CREATININE 1.04 1.00  CALCIUM 9.0 9.3   Liver Function Tests: No results for input(s): AST, ALT, ALKPHOS, BILITOT, PROT, ALBUMIN in the last 168 hours. No results for input(s): LIPASE, AMYLASE in the last 168 hours. No results for input(s): AMMONIA in the last 168 hours. CBC:  Recent Labs Lab 12/16/14 2220 12/17/14 0543  WBC 14.6* 14.2*  NEUTROABS 9.2*  --   HGB 17.7* 16.9  HCT 52.6* 51.3  MCV 93.1 93.4  PLT 223 219   Cardiac Enzymes: No results for input(s): CKTOTAL, CKMB, CKMBINDEX, TROPONINI in the last 168 hours. BNP: BNP (last 3 results) No results for input(s): BNP in the last 8760 hours.  ProBNP (last 3 results) No results for input(s): PROBNP in the last 8760 hours.  CBG: No results for input(s): GLUCAP in the last 168 hours.     SignedRadene Gunning  [facility] 12/18/2014, 10:31 AM

## 2014-12-18 NOTE — Progress Notes (Signed)
Patient discharged with instructions, prescription, and care notes.  Verbalized understanding via teach back.  IV was removed and the site was WNL. Patient voiced no further complaints or concerns at the time of discharge.  Appointments scheduled per instructions.  Patient left the floor via ambulation with staff and family in stable condition. 

## 2015-01-03 ENCOUNTER — Emergency Department (HOSPITAL_COMMUNITY): Payer: Medicare PPO

## 2015-01-03 ENCOUNTER — Encounter (HOSPITAL_COMMUNITY): Payer: Self-pay

## 2015-01-03 ENCOUNTER — Emergency Department (HOSPITAL_COMMUNITY)
Admission: EM | Admit: 2015-01-03 | Discharge: 2015-01-03 | Disposition: A | Discharge status: Home / Self Care | Payer: Medicare PPO | Attending: Emergency Medicine | Admitting: Emergency Medicine

## 2015-01-03 DIAGNOSIS — J441 Chronic obstructive pulmonary disease with (acute) exacerbation: Secondary | ICD-10-CM

## 2015-01-03 DIAGNOSIS — M1990 Unspecified osteoarthritis, unspecified site: Secondary | ICD-10-CM | POA: Diagnosis not present

## 2015-01-03 DIAGNOSIS — Z720 Tobacco use: Secondary | ICD-10-CM | POA: Insufficient documentation

## 2015-01-03 DIAGNOSIS — I10 Essential (primary) hypertension: Secondary | ICD-10-CM | POA: Diagnosis not present

## 2015-01-03 DIAGNOSIS — Z7952 Long term (current) use of systemic steroids: Secondary | ICD-10-CM | POA: Diagnosis not present

## 2015-01-03 DIAGNOSIS — Z792 Long term (current) use of antibiotics: Secondary | ICD-10-CM | POA: Diagnosis not present

## 2015-01-03 DIAGNOSIS — Z79899 Other long term (current) drug therapy: Secondary | ICD-10-CM | POA: Insufficient documentation

## 2015-01-03 DIAGNOSIS — R0602 Shortness of breath: Secondary | ICD-10-CM | POA: Diagnosis present

## 2015-01-03 LAB — CBC WITH DIFFERENTIAL/PLATELET
BASOS PCT: 0 % (ref 0–1)
Basophils Absolute: 0 10*3/uL (ref 0.0–0.1)
Eosinophils Absolute: 0.1 10*3/uL (ref 0.0–0.7)
Eosinophils Relative: 1 % (ref 0–5)
HEMATOCRIT: 48 % (ref 39.0–52.0)
Hemoglobin: 15.9 g/dL (ref 13.0–17.0)
Lymphocytes Relative: 30 % (ref 12–46)
Lymphs Abs: 3 10*3/uL (ref 0.7–4.0)
MCH: 30.8 pg (ref 26.0–34.0)
MCHC: 33.1 g/dL (ref 30.0–36.0)
MCV: 93 fL (ref 78.0–100.0)
MONO ABS: 0.9 10*3/uL (ref 0.1–1.0)
Monocytes Relative: 9 % (ref 3–12)
Neutro Abs: 6 10*3/uL (ref 1.7–7.7)
Neutrophils Relative %: 60 % (ref 43–77)
PLATELETS: 200 10*3/uL (ref 150–400)
RBC: 5.16 MIL/uL (ref 4.22–5.81)
RDW: 12.8 % (ref 11.5–15.5)
WBC: 10 10*3/uL (ref 4.0–10.5)

## 2015-01-03 LAB — BASIC METABOLIC PANEL
Anion gap: 4 — ABNORMAL LOW (ref 5–15)
BUN: 10 mg/dL (ref 6–23)
CO2: 26 mmol/L (ref 19–32)
Calcium: 8.6 mg/dL (ref 8.4–10.5)
Chloride: 108 mmol/L (ref 96–112)
Creatinine, Ser: 1.07 mg/dL (ref 0.50–1.35)
GFR calc Af Amer: 90 mL/min — ABNORMAL LOW (ref >90)
GFR, EST NON AFRICAN AMERICAN: 77 mL/min — AB (ref >90)
Glucose, Bld: 131 mg/dL — ABNORMAL HIGH (ref 70–99)
Potassium: 4.4 mmol/L (ref 3.5–5.1)
SODIUM: 138 mmol/L (ref 135–145)

## 2015-01-03 LAB — TROPONIN I

## 2015-01-03 MED ORDER — PREDNISONE 20 MG PO TABS: ORAL_TABLET | ORAL | Status: DC

## 2015-01-03 MED ORDER — IPRATROPIUM-ALBUTEROL 0.5-2.5 (3) MG/3ML IN SOLN
3.0000 mL | Freq: Once | RESPIRATORY_TRACT | Status: AC
  Administered 2015-01-03: 3 mL via RESPIRATORY_TRACT
  Filled 2015-01-03: qty 3

## 2015-01-03 MED ORDER — ALBUTEROL SULFATE (2.5 MG/3ML) 0.083% IN NEBU
2.5000 mg | INHALATION_SOLUTION | Freq: Once | RESPIRATORY_TRACT | Status: AC
  Administered 2015-01-03: 2.5 mg via RESPIRATORY_TRACT
  Filled 2015-01-03: qty 3

## 2015-01-03 MED ORDER — METHYLPREDNISOLONE SODIUM SUCC 125 MG IJ SOLR
125.0000 mg | Freq: Once | INTRAMUSCULAR | Status: AC
  Administered 2015-01-03: 125 mg via INTRAVENOUS
  Filled 2015-01-03: qty 2

## 2015-01-03 MED ORDER — ALBUTEROL SULFATE (2.5 MG/3ML) 0.083% IN NEBU
10.0000 mg | INHALATION_SOLUTION | Freq: Once | RESPIRATORY_TRACT | Status: AC
  Administered 2015-01-03: 10 mg via RESPIRATORY_TRACT
  Filled 2015-01-03: qty 12

## 2015-01-03 NOTE — ED Provider Notes (Signed)
CSN: [id number]     Arrival date & time 01/03/15  1025 History   First MD Initiated Contact with Patient 01/03/15 0801     Chief Complaint  Patient presents with  . Shortness of Breath     (Consider location/radiation/quality/duration/timing/severity/associated sxs/prior Treatment) Patient is a 54 y.o. male presenting with shortness of breath. The history is provided by the patient.  Shortness of Breath Severity:  Moderate Onset quality:  Unable to specify (woke him from sleep at 4 am) Timing:  Constant Progression:  Worsening Chronicity:  Recurrent Context comment:  Feels like his COPD flare up.  worsens with activity Relieved by:  Nothing Worsened by:  Activity Ineffective treatments:  Inhaler Associated symptoms: cough   Associated symptoms: no abdominal pain, no chest pain, no diaphoresis, no fever, no headaches, no hemoptysis, no neck pain, no rash, no sore throat, no sputum production and no wheezing     Past Medical History  Diagnosis Date  . Arthritis   . Bronchitis   . COPD (chronic obstructive pulmonary disease)   . HTN (hypertension)    Past Surgical History  Procedure Laterality Date  . Total hip arthroplasty     No family history on file. History  Substance Use Topics  . Smoking status: Current Every Day Smoker -- 0.50 packs/day    Types: Cigarettes  . Smokeless tobacco: Not on file  . Alcohol Use: Yes     Comment: occ    Review of Systems  Constitutional: Negative for fever and diaphoresis.  HENT: Negative for congestion and sore throat.   Eyes: Negative.   Respiratory: Positive for cough, chest tightness and shortness of breath. Negative for hemoptysis, sputum production and wheezing.   Cardiovascular: Negative for chest pain, palpitations and leg swelling.  Gastrointestinal: Negative for nausea and abdominal pain.  Genitourinary: Negative.   Musculoskeletal: Negative for joint swelling, arthralgias and neck pain.  Skin: Negative.  Negative for  rash and wound.  Neurological: Negative for dizziness, weakness, light-headedness, numbness and headaches.  Psychiatric/Behavioral: Negative.       Allergies  Review of patient's allergies indicates no known allergies.  Home Medications   Prior to Admission medications   Medication Sig Start Date End Date Taking? Authorizing Provider  albuterol (PROVENTIL HFA;VENTOLIN HFA) 108 (90 BASE) MCG/ACT inhaler Inhale 2 puffs into the lungs every 6 (six) hours as needed for wheezing.    Historical Provider, MD  hydrochlorothiazide (MICROZIDE) 12.5 MG capsule Take 1 capsule by mouth daily. 12/02/14   Historical Provider, MD  levofloxacin (LEVAQUIN) 500 MG tablet Take 1 tablet (500 mg total) by mouth daily. 12/18/14   Nita Sells, MD  naproxen sodium (ANAPROX) 220 MG tablet Take 440 mg by mouth daily as needed (for pain).    Historical Provider, MD  nicotine (NICODERM CQ - DOSED IN MG/24 HOURS) 14 mg/24hr patch Place 1 patch (14 mg total) onto the skin daily. 12/18/14   Radene Gunning, NP  predniSONE (DELTASONE) 20 MG tablet Take 3 tablets (60 mg total) by mouth daily before breakfast. For 4 days starting 12/19/14 12/18/14   Radene Gunning, NP  SYMBICORT 80-4.5 MCG/ACT inhaler Inhale 2 puffs into the lungs 2 (two) times daily. 12/02/14   Historical Provider, MD  tiotropium (SPIRIVA) 18 MCG inhalation capsule Place 18 mcg into inhaler and inhale daily.    Historical Provider, MD   BP 145/92 mmHg  Pulse 107  Temp(Src) 98.6 F (37 C) (Oral)  Resp 16  Ht 5\' 6"  (1.676 m)  Wt 260 lb (117.935 kg)  BMI 41.99 kg/m2  SpO2 92% Physical Exam  Constitutional: He appears well-developed and well-nourished.  HENT:  Head: Normocephalic and atraumatic.  Eyes: Conjunctivae are normal.  Neck: Normal range of motion.  Cardiovascular: Normal rate, regular rhythm, normal heart sounds and intact distal pulses.   Pulmonary/Chest: Effort normal. No respiratory distress. He has decreased breath sounds. He has no  wheezes. He has no rhonchi. He has no rales.  Poor air movement with breathing.  Prolonged expirations, no wheeze at present.  Abdominal: Soft. Bowel sounds are normal. There is no tenderness.  Musculoskeletal: Normal range of motion. He exhibits no edema or tenderness.  Neurological: He is alert.  Skin: Skin is warm and dry.  Psychiatric: He has a normal mood and affect.  Nursing note and vitals reviewed.   ED Course  Procedures (including critical care time) Labs Review Labs Reviewed  CBC WITH DIFFERENTIAL/PLATELET  BASIC METABOLIC PANEL  TROPONIN I    Imaging Review No results found.   EKG Interpretation   Date/Time:  Sunday January 03 2015 07:52:20 EST Ventricular Rate:  106 PR Interval:  135 QRS Duration: 85 QT Interval:  307 QTC Calculation: 408 R Axis:   83 Text Interpretation:  Sinus tachycardia Low voltage, extremity leads  Abnormal R-wave progression, late transition No significant change since  last tracing in 2009 Confirmed by WARD,  DO, KRISTEN [id number]) on 01/03/2015  8:01:51 AM      MDM   Final diagnoses:  None    Medications  ipratropium-albuterol (DUONEB) 0.5-2.5 (3) MG/3ML nebulizer solution 3 mL (3 mLs Nebulization Given 01/03/15 0828)  albuterol (PROVENTIL) (2.5 MG/3ML) 0.083% nebulizer solution 2.5 mg (2.5 mg Nebulization Given 01/03/15 0828)  methylPREDNISolone sodium succinate (SOLU-MEDROL) 125 mg/2 mL injection 125 mg (125 mg Intravenous Given 01/03/15 0812)  albuterol (PROVENTIL) (2.5 MG/3ML) 0.083% nebulizer solution 10 mg (10 mg Nebulization Given 01/03/15 0938)    Pt was given albuterol /atrovent neb tx with no significant improvement in breathing.  Solumedrol IV given.  Repeat albuterol neb 10 mg over 1 hour.  Pt feels improved, on exam much improved aeration, still distal sounds, but suspected baseline for this pt.  He states he is not "100%", but feels improved.  Ambulated with no desaturation, no sob.   No wheezing appreciated during multiple  auscultations during this visit.   Noted that at his last hospitalization he was prescribed a 30 day Levaquin course, suspect erroneous as the dc summary indicated planned 3 day course at home.  Pt asked to dc this med today.  He was given a prednisone 60 mg 4 day pulse taper starting tomorrow.  Pt was seen by Dr. Leonides Schanz during this visit.  He was tachy during ambulation but after back to back albuterol nebs, suspected SE.     Evalee Jefferson, PA-C 01/03/15 [place], DO 01/03/15 1207

## 2015-01-03 NOTE — ED Notes (Signed)
Walked Pt around Er tolerated well no shortness of breath. Pulse was 119 and his spo2 was 96. Time was 11:30am.

## 2015-01-03 NOTE — ED Notes (Signed)
PA at bedside.

## 2015-01-03 NOTE — Discharge Instructions (Signed)
Stop taking your Levaquin antibiotic medicine at this time.  Take your first dose of this new prednisone prescription tomorrow morning.  Follow up here or with your primary doctor if you have any worsening symptoms.  Use your inhaler 2 puffs every 4 hours if you are short of breath, coughing or wheezing.    Chronic Obstructive Pulmonary Disease Chronic obstructive pulmonary disease (COPD) is a common lung problem. In COPD, the flow of air from the lungs is limited. The way your lungs work will probably never return to normal, but there are things you can do to improve your lungs and make yourself feel better. HOME CARE  Take all medicines as told by your doctor.  Avoid medicines or cough syrups that dry up your airway (such as antihistamines) and do not allow you to get rid of thick spit. You do not need to avoid them if told differently by your doctor.  If you smoke, stop. Smoking makes the problem worse.  Avoid being around things that make your breathing worse (like smoke, chemicals, and fumes).  Use oxygen therapy and therapy to help improve your lungs (pulmonary rehabilitation) if told by your doctor. If you need home oxygen therapy, ask your doctor if you should buy a tool to measure your oxygen level (oximeter).  Avoid people who have a sickness you can catch (contagious).  Avoid going outside when it is very hot, cold, or humid.  Eat healthy foods. Eat smaller meals more often. Rest before meals.  Stay active, but remember to also rest.  Make sure to get all the shots (vaccines) your doctor recommends. Ask your doctor if you need a pneumonia shot.  Learn and use tips on how to relax.  Learn and use tips on how to control your breathing as told by your doctor. Try:  Breathing in (inhaling) through your nose for 1 second. Then, pucker your lips and breath out (exhale) through your lips for 2 seconds.  Putting one hand on your belly (abdomen). Breathe in slowly through your  nose for 1 second. Your hand on your belly should move out. Pucker your lips and breathe out slowly through your lips. Your hand on your belly should move in as you breathe out.  Learn and use controlled coughing to clear thick spit from your lungs. The steps are: 1. Lean your head a little forward. 2. Breathe in deeply. 3. Try to hold your breath for 3 seconds. 4. Keep your mouth slightly open while coughing 2 times. 5. Spit any thick spit out into a tissue. 6. Rest and do the steps again 1 or 2 times as needed. GET HELP IF:  You cough up more thick spit than usual.  There is a change in the color or thickness of the spit.  It is harder to breathe than usual.  Your breathing is faster than usual. GET HELP RIGHT AWAY IF:   You have shortness of breath while resting.  You have shortness of breath that stops you from:  Being able to talk.  Doing normal activities.  You chest hurts for longer than 5 minutes.  Your skin color is more blue than usual.  Your pulse oximeter shows that you have low oxygen for longer than 5 minutes. MAKE SURE YOU:   Understand these instructions.  Will watch your condition.  Will get help right away if you are not doing well or get worse. Document Released: 04/03/2008 Document Revised: 03/02/2014 Document Reviewed: 06/12/2013 Exit[employer] Patient Information 2015 [employer]. This information is not intended to replace advice given to you by your health care provider. Make sure you discuss any questions you have with your health care provider.

## 2015-01-03 NOTE — ED Notes (Signed)
Pt reports has history of copd.  Reports woke up this morning between 3am and 4am with sob and chest tightness.  Denies cough or fever.

## 2015-02-27 ENCOUNTER — Emergency Department (HOSPITAL_COMMUNITY)
Admission: EM | Admit: 2015-02-27 | Discharge: 2015-02-27 | Disposition: A | Discharge status: Home / Self Care | Payer: Medicare HMO | Attending: Emergency Medicine | Admitting: Emergency Medicine

## 2015-02-27 ENCOUNTER — Encounter (HOSPITAL_COMMUNITY): Payer: Self-pay | Admitting: Emergency Medicine

## 2015-02-27 DIAGNOSIS — Z720 Tobacco use: Secondary | ICD-10-CM | POA: Insufficient documentation

## 2015-02-27 DIAGNOSIS — L0291 Cutaneous abscess, unspecified: Secondary | ICD-10-CM

## 2015-02-27 DIAGNOSIS — Z792 Long term (current) use of antibiotics: Secondary | ICD-10-CM | POA: Insufficient documentation

## 2015-02-27 DIAGNOSIS — Z79899 Other long term (current) drug therapy: Secondary | ICD-10-CM | POA: Diagnosis not present

## 2015-02-27 DIAGNOSIS — M1990 Unspecified osteoarthritis, unspecified site: Secondary | ICD-10-CM | POA: Insufficient documentation

## 2015-02-27 DIAGNOSIS — I10 Essential (primary) hypertension: Secondary | ICD-10-CM | POA: Insufficient documentation

## 2015-02-27 DIAGNOSIS — Z791 Long term (current) use of non-steroidal anti-inflammatories (NSAID): Secondary | ICD-10-CM | POA: Diagnosis not present

## 2015-02-27 DIAGNOSIS — L02411 Cutaneous abscess of right axilla: Secondary | ICD-10-CM | POA: Insufficient documentation

## 2015-02-27 DIAGNOSIS — J449 Chronic obstructive pulmonary disease, unspecified: Secondary | ICD-10-CM | POA: Insufficient documentation

## 2015-02-27 MED ORDER — LIDOCAINE-EPINEPHRINE 2 %-1:100000 IJ SOLN
20.0000 mL | Freq: Once | INTRAMUSCULAR | Status: DC
Start: 2015-02-27 — End: 2015-02-27

## 2015-02-27 MED ORDER — LIDOCAINE-EPINEPHRINE (PF) 2 %-1:200000 IJ SOLN
INTRAMUSCULAR | Status: AC
  Filled 2015-02-27: qty 20

## 2015-02-27 MED ORDER — LIDOCAINE-EPINEPHRINE (PF) 2 %-1:200000 IJ SOLN
20.0000 mL | Freq: Once | INTRAMUSCULAR | Status: AC
  Administered 2015-02-27: 20 mL via INTRADERMAL

## 2015-02-27 MED ORDER — LIDOCAINE-EPINEPHRINE (PF) 1 %-1:200000 IJ SOLN: 20.0000 mL | Freq: Once | INTRAMUSCULAR | Status: DC

## 2015-02-27 MED ORDER — SULFAMETHOXAZOLE-TRIMETHOPRIM 800-160 MG PO TABS: 1.0000 | ORAL_TABLET | Freq: Two times a day (BID) | ORAL | Status: AC

## 2015-02-27 NOTE — ED Notes (Signed)
Pt from home, brought in by friend. Red raised area under right axilla since Tuesday. Denies any drainage

## 2015-02-27 NOTE — ED Notes (Signed)
Pt made aware to return if symptoms worsen or if any life threatening symptoms occur.   

## 2015-02-27 NOTE — ED Provider Notes (Signed)
CSN: [id number]     Arrival date & time 02/27/15  0724 History   First MD Initiated Contact with Patient 02/27/15 0732     Chief Complaint  Patient presents with  . Abscess     (Consider location/radiation/quality/duration/timing/severity/associated sxs/prior Treatment) HPI Comments: Pt comes in with cc of abscess. Pt has hx of COPD, HTN, previous boils. Reports that his R axilla developed some swelling on Tuesday, which has gotten larger overtime. No drainage. No shaving of the axilla. Pt has no n/v/f/c.  The history is provided by the patient.    Past Medical History  Diagnosis Date  . Arthritis   . Bronchitis   . COPD (chronic obstructive pulmonary disease)   . HTN (hypertension)    Past Surgical History  Procedure Laterality Date  . Total hip arthroplasty     No family history on file. History  Substance Use Topics  . Smoking status: Current Every Day Smoker -- 0.50 packs/day    Types: Cigarettes  . Smokeless tobacco: Not on file  . Alcohol Use: Yes     Comment: occ    Review of Systems  Constitutional: Negative for fever and chills.  Gastrointestinal: Negative for nausea.  Skin: Positive for rash.  Allergic/Immunologic: Positive for immunocompromised state.  Neurological: Negative for weakness.      Allergies  Review of patient's allergies indicates no known allergies.  Home Medications   Prior to Admission medications   Medication Sig Start Date End Date Taking? Authorizing Provider  albuterol (PROVENTIL HFA;VENTOLIN HFA) 108 (90 BASE) MCG/ACT inhaler Inhale 2 puffs into the lungs every 6 (six) hours as needed for wheezing.    Historical Provider, MD  hydrochlorothiazide (MICROZIDE) 12.5 MG capsule Take 1 capsule by mouth daily. 12/02/14   Historical Provider, MD  levofloxacin (LEVAQUIN) 500 MG tablet Take 1 tablet (500 mg total) by mouth daily. 12/18/14   Nita Sells, MD  naproxen sodium (ANAPROX) 220 MG tablet Take 440 mg by mouth daily as needed  (for pain).    Historical Provider, MD  nicotine (NICODERM CQ - DOSED IN MG/24 HOURS) 14 mg/24hr patch Place 1 patch (14 mg total) onto the skin daily. Patient not taking: Reported on 01/03/2015 12/18/14   Radene Gunning, NP  predniSONE (DELTASONE) 20 MG tablet 3 tabs by mouth daily for 4 days 01/03/15   Evalee Jefferson, PA-C  sulfamethoxazole-trimethoprim (BACTRIM DS,SEPTRA DS) 800-160 MG per tablet Take 1 tablet by mouth 2 (two) times daily. 02/27/15 03/06/15  Varney Biles, MD  SYMBICORT 80-4.5 MCG/ACT inhaler Inhale 2 puffs into the lungs 2 (two) times daily. 12/02/14   Historical Provider, MD  tiotropium (SPIRIVA) 18 MCG inhalation capsule Place 18 mcg into inhaler and inhale daily.    Historical Provider, MD   BP 151/96 mmHg  Pulse 96  Temp(Src) 98.2 F (36.8 C) (Oral)  Ht '5\' 6"'$  (1.676 m)  Wt 250 lb (113.399 kg)  BMI 40.37 kg/m2  SpO2 95% Physical Exam  Constitutional: He appears well-developed.  Eyes: Conjunctivae are normal.  Neck: Neck supple.  Cardiovascular: Normal rate.   Pulmonary/Chest: Effort normal.  Musculoskeletal:  Right axilla - there is 5 cm area, raised, erythematous lesions, tender with fluctuance.  Neurological: He is alert.  Nursing note and vitals reviewed.   ED Course  Procedures (including critical care time) Labs Review Labs Reviewed - No data to display  Imaging Review No results found.   EKG Interpretation None      MDM   Final diagnoses:  Abscess  INCISION AND DRAINAGE Performed by: Varney Biles Consent: Verbal consent obtained. Risks and benefits: risks, benefits and alternatives were discussed Type: abscess  Body area: R axilla  Anesthesia: local infiltration  Incision was made with a scalpel.  Local anesthetic: lidocaine 2 % with epinephrine  Anesthetic total: 5 ml  Complexity: complex Blunt dissection to break up loculations  Drainage: purulent  Drainage amount: moderate  Packing material: 1/4 in iodoform gauze  Patient  tolerance: Patient tolerated the procedure well with no immediate complications.   PT with abscess, likely some surrounding erythema. Abscess drained, wound packed. Will d.c.     Varney Biles, MD 02/27/15 [phone]

## 2015-02-27 NOTE — Discharge Instructions (Signed)
Abscess An abscess is an infected area that contains a collection of pus and debris.It can occur in almost any part of the body. An abscess is also known as a furuncle or boil. CAUSES  An abscess occurs when tissue gets infected. This can occur from blockage of oil or sweat glands, infection of hair follicles, or a minor injury to the skin. As the body tries to fight the infection, pus collects in the area and creates pressure under the skin. This pressure causes pain. People with weakened immune systems have difficulty fighting infections and get certain abscesses more often.  SYMPTOMS Usually an abscess develops on the skin and becomes a painful mass that is red, warm, and tender. If the abscess forms under the skin, you may feel a moveable soft area under the skin. Some abscesses break open (rupture) on their own, but most will continue to get worse without care. The infection can spread deeper into the body and eventually into the bloodstream, causing you to feel ill.  DIAGNOSIS  Your caregiver will take your medical history and perform a physical exam. A sample of fluid may also be taken from the abscess to determine what is causing your infection. TREATMENT  Your caregiver may prescribe antibiotic medicines to fight the infection. However, taking antibiotics alone usually does not cure an abscess. Your caregiver may need to make a small cut (incision) in the abscess to drain the pus. In some cases, gauze is packed into the abscess to reduce pain and to continue draining the area. HOME CARE INSTRUCTIONS   Only take over-the-counter or prescription medicines for pain, discomfort, or fever as directed by your caregiver.  If you were prescribed antibiotics, take them as directed. Finish them even if you start to feel better.  If gauze is used, follow your caregiver's directions for changing the gauze.  To avoid spreading the infection:  Keep your draining abscess covered with a  bandage.  Wash your hands well.  Do not share personal care items, towels, or whirlpools with others.  Avoid skin contact with others.  Keep your skin and clothes clean around the abscess.  Keep all follow-up appointments as directed by your caregiver. SEEK MEDICAL CARE IF:   You have increased pain, swelling, redness, fluid drainage, or bleeding.  You have muscle aches, chills, or a general ill feeling.  You have a fever. MAKE SURE YOU:   Understand these instructions.  Will watch your condition.  Will get help right away if you are not doing well or get worse. Document Released: 07/26/2005 Document Revised: 04/16/2012 Document Reviewed: 12/29/2011 [facility] Patient Information 2015 [place], [place]. This information is not intended to replace advice given to you by your health care provider. Make sure you discuss any questions you have with your health care provider.  Abscess Care After An abscess (also called a boil or furuncle) is an infected area that contains a collection of pus. Signs and symptoms of an abscess include pain, tenderness, redness, or hardness, or you may feel a moveable soft area under your skin. An abscess can occur anywhere in the body. The infection may spread to surrounding tissues causing cellulitis. A cut (incision) by the surgeon was made over your abscess and the pus was drained out. Gauze may have been packed into the space to provide a drain that will allow the cavity to heal from the inside outwards. The boil may be painful for 5 to 7 days. Most people with a boil do not have  high fevers. Your abscess, if seen early, may not have localized, and may not have been lanced. If not, another appointment may be required for this if it does not get better on its own or with medications. HOME CARE INSTRUCTIONS   Only take over-the-counter or prescription medicines for pain, discomfort, or fever as directed by your caregiver.  When you bathe, soak and then  remove gauze or iodoform packs at least daily or as directed by your caregiver. You may then wash the wound gently with mild soapy water. Repack with gauze or do as your caregiver directs. SEEK IMMEDIATE MEDICAL CARE IF:   You develop increased pain, swelling, redness, drainage, or bleeding in the wound site.  You develop signs of generalized infection including muscle aches, chills, fever, or a general ill feeling.  An oral temperature above 102 F (38.9 C) develops, not controlled by medication. See your caregiver for a recheck if you develop any of the symptoms described above. If medications (antibiotics) were prescribed, take them as directed. Document Released: 05/04/2005 Document Revised: 01/08/2012 Document Reviewed: 12/30/2007 [facility] Patient Information 2015 [place], [place]. This information is not intended to replace advice given to you by your health care provider. Make sure you discuss any questions you have with your health care provider.

## 2015-07-15 ENCOUNTER — Emergency Department (HOSPITAL_COMMUNITY): Payer: Medicare HMO

## 2015-07-15 ENCOUNTER — Encounter (HOSPITAL_COMMUNITY): Payer: Self-pay

## 2015-07-15 ENCOUNTER — Emergency Department (HOSPITAL_COMMUNITY)
Admission: EM | Admit: 2015-07-15 | Discharge: 2015-07-16 | Disposition: A | Discharge status: Home / Self Care | Payer: Medicare HMO | Attending: Emergency Medicine | Admitting: Emergency Medicine

## 2015-07-15 DIAGNOSIS — I10 Essential (primary) hypertension: Secondary | ICD-10-CM | POA: Insufficient documentation

## 2015-07-15 DIAGNOSIS — Z720 Tobacco use: Secondary | ICD-10-CM | POA: Diagnosis not present

## 2015-07-15 DIAGNOSIS — R0789 Other chest pain: Secondary | ICD-10-CM | POA: Insufficient documentation

## 2015-07-15 DIAGNOSIS — Z9981 Dependence on supplemental oxygen: Secondary | ICD-10-CM | POA: Diagnosis not present

## 2015-07-15 DIAGNOSIS — Z7951 Long term (current) use of inhaled steroids: Secondary | ICD-10-CM | POA: Insufficient documentation

## 2015-07-15 DIAGNOSIS — J441 Chronic obstructive pulmonary disease with (acute) exacerbation: Secondary | ICD-10-CM | POA: Diagnosis not present

## 2015-07-15 DIAGNOSIS — M1990 Unspecified osteoarthritis, unspecified site: Secondary | ICD-10-CM | POA: Diagnosis not present

## 2015-07-15 DIAGNOSIS — Z79899 Other long term (current) drug therapy: Secondary | ICD-10-CM | POA: Diagnosis not present

## 2015-07-15 DIAGNOSIS — R0682 Tachypnea, not elsewhere classified: Secondary | ICD-10-CM | POA: Insufficient documentation

## 2015-07-15 DIAGNOSIS — R0602 Shortness of breath: Secondary | ICD-10-CM | POA: Diagnosis present

## 2015-07-15 LAB — BASIC METABOLIC PANEL
ANION GAP: 3 — AB (ref 5–15)
BUN: 8 mg/dL (ref 6–20)
CALCIUM: 8.8 mg/dL — AB (ref 8.9–10.3)
CO2: 33 mmol/L — ABNORMAL HIGH (ref 22–32)
Chloride: 104 mmol/L (ref 101–111)
Creatinine, Ser: 0.94 mg/dL (ref 0.61–1.24)
GFR calc Af Amer: >60 mL/min (ref >60)
GLUCOSE: 91 mg/dL (ref 65–99)
Potassium: 4.4 mmol/L (ref 3.5–5.1)
SODIUM: 140 mmol/L (ref 135–145)

## 2015-07-15 LAB — CBC WITH DIFFERENTIAL/PLATELET
Basophils Absolute: 0 10*3/uL (ref 0.0–0.1)
Basophils Relative: 0 %
EOS ABS: 0.2 10*3/uL (ref 0.0–0.7)
EOS PCT: 2 %
HCT: 50.9 % (ref 39.0–52.0)
Hemoglobin: 17.1 g/dL — ABNORMAL HIGH (ref 13.0–17.0)
Lymphocytes Relative: 33 %
Lymphs Abs: 2.8 10*3/uL (ref 0.7–4.0)
MCH: 31 pg (ref 26.0–34.0)
MCHC: 33.6 g/dL (ref 30.0–36.0)
MCV: 92.4 fL (ref 78.0–100.0)
MONO ABS: 1 10*3/uL (ref 0.1–1.0)
Monocytes Relative: 12 %
Neutro Abs: 4.5 10*3/uL (ref 1.7–7.7)
Neutrophils Relative %: 53 %
PLATELETS: 192 10*3/uL (ref 150–400)
RBC: 5.51 MIL/uL (ref 4.22–5.81)
RDW: 13.3 % (ref 11.5–15.5)
WBC: 8.5 10*3/uL (ref 4.0–10.5)

## 2015-07-15 MED ORDER — IPRATROPIUM BROMIDE 0.02 % IN SOLN
0.5000 mg | Freq: Once | RESPIRATORY_TRACT | Status: AC
  Administered 2015-07-15: 0.5 mg via RESPIRATORY_TRACT
  Filled 2015-07-15: qty 2.5

## 2015-07-15 MED ORDER — DEXTROSE 5 % IV SOLN
500.0000 mg | INTRAVENOUS | Status: DC
  Administered 2015-07-16: 500 mg via INTRAVENOUS
  Filled 2015-07-15: qty 500

## 2015-07-15 MED ORDER — DEXTROSE 5 % IV SOLN
1.0000 g | Freq: Once | INTRAVENOUS | Status: AC
  Administered 2015-07-15: 1 g via INTRAVENOUS
  Filled 2015-07-15: qty 10

## 2015-07-15 MED ORDER — METHYLPREDNISOLONE SODIUM SUCC 125 MG IJ SOLR
125.0000 mg | Freq: Once | INTRAMUSCULAR | Status: AC
  Administered 2015-07-15: 125 mg via INTRAVENOUS
  Filled 2015-07-15: qty 2

## 2015-07-15 MED ORDER — ALBUTEROL (5 MG/ML) CONTINUOUS INHALATION SOLN
15.0000 mg/h | INHALATION_SOLUTION | Freq: Once | RESPIRATORY_TRACT | Status: AC
  Administered 2015-07-15: 15 mg/h via RESPIRATORY_TRACT
  Filled 2015-07-15: qty 20

## 2015-07-15 NOTE — ED Notes (Signed)
Pt reports he has SOB , hx of lung cancer, on 2L Oxygen currently

## 2015-07-15 NOTE — ED Provider Notes (Signed)
CSN: [id number]     Arrival date & time 07/15/15  1830 History  This chart was scribed for Rolland Porter, MD by Terressa Koyanagi, ED Scribe. This patient was seen in room APA09/APA09 and the patient's care was started at 11:15 PM.   Chief Complaint  Patient presents with  . Shortness of Breath   The history is provided by the patient. No language interpreter was used.   PCP: PROVIDER NOT IN SYSTEM  ONCOLOGY: [facility]  HPI Comments: Miguel Marsh is a 54 y.o. male, with PMHx noted below including COPD, Hx of lung cancer (Dx 2.5 months ago and currently undergoing chemo with no radiation), daily tobacco use (0.5 ppd),  and home O2 of 2L/min, who presents to the Emergency Department complaining of gradually worsening SOB with associated mild wheezing and chest tightness onset at 4pm today. Pt reports using his inhaler at home with minimal relief. 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 He reports he had his first chemotherapy treatment 3 days ago. Pt reports prior steroid use for SOB. Pt denies cough, n/v, sore throat, rhinorrhea, chronic EtOH use, or any other Sx at this time.   Pt states all his doctors are at [facility]  Past Medical History  Diagnosis Date  . Arthritis   . Bronchitis   . COPD (chronic obstructive pulmonary disease)   . HTN (hypertension)    Past Surgical History  Procedure Laterality Date  . Total hip arthroplasty     No family history on file. Social History  Substance Use Topics  . Smoking status: Current Every Day Smoker -- 0.50 packs/day    Types: Cigarettes  . Smokeless tobacco: None  . Alcohol Use: Yes     Comment: occ  on home oxygen 2 lpm [place]  Review of Systems  HENT: Negative for rhinorrhea and sore throat.   Respiratory: Positive for chest tightness and shortness of breath. Negative for cough.   Gastrointestinal:  Negative for nausea and vomiting.  All other systems reviewed and are negative.  Allergies  Review of patient's allergies indicates no known allergies.  Home Medications   Prior to Admission medications   Medication Sig Start Date End Date Taking? Authorizing Provider  albuterol (PROVENTIL HFA;VENTOLIN HFA) 108 (90 BASE) MCG/ACT inhaler Inhale 2 puffs into the lungs every 6 (six) hours as needed for wheezing.    Historical Provider, MD  azithromycin (ZITHROMAX) 250 MG tablet Take 1 po daily starting on Saturday morning 07/16/15   Rolland Porter, MD  hydrochlorothiazide (MICROZIDE) 12.5 MG capsule Take 1 capsule by mouth daily. 12/02/14  Yes Historical Provider, MD  levofloxacin (LEVAQUIN) 500 MG tablet Take 1 tablet (500 mg total) by mouth daily. Patient not taking: Reported on 07/15/2015 12/18/14   Nita Sells, MD  naproxen sodium (ANAPROX) 220 MG tablet Take 440 mg by mouth daily as needed (for pain).    Historical Provider, MD  nicotine (NICODERM CQ - DOSED IN MG/24 HOURS) 14 mg/24hr patch Place 1 patch (14 mg total) onto the skin daily. Patient not taking: Reported on 01/03/2015 12/18/14   Radene Gunning, NP  omeprazole (PRILOSEC) 40 MG capsule Take 40 mg by mouth daily. 04/06/15  Yes Historical Provider, MD  Oxycodone HCl 10 MG TABS Take 10 mg by mouth every 4 (four) hours as needed (pain).  07/12/15  Yes Historical Provider, MD  predniSONE (DELTASONE) 20 MG tablet Take 3 po QD x 3d , then 2 po QD x 3d then 1 po QD x 3d 07/16/15   Rolland Porter, MD  prochlorperazine (COMPAZINE) 10 MG tablet Take 10 mg by mouth every 6 (six) hours as needed for nausea or vomiting.  07/12/15  Yes Historical Provider, MD  senna-docusate (SENOKOT-S) 8.6-50 MG per tablet Take 2 tablets by mouth at bedtime as needed for mild constipation.  06/15/15  Yes Historical Provider, MD  SYMBICORT 80-4.5 MCG/ACT inhaler Inhale 2 puffs into the lungs 2 (two) times daily. 12/02/14  Yes Historical Provider, MD  tiotropium (SPIRIVA) 18 MCG  inhalation capsule Place 18 mcg into inhaler and inhale daily.   Yes Historical Provider, MD   Triage Vitals: BP 150/110 mmHg  Pulse 82  Temp(Src) 98.2 F (36.8 C)  Resp 24  Ht '5\' 6"'$  (1.676 m)  Wt 235 lb (106.595 kg)  BMI 37.95 kg/m2  SpO2 96%  Vital signs normal except for hypertension  Physical Exam  Constitutional: He is oriented to person, place, and time. He appears well-developed and well-nourished.  Non-toxic appearance. He does not appear ill. No distress.  HENT:  Head: Normocephalic and atraumatic.  Right Ear: External ear normal.  Left Ear: External ear normal.  Nose: Nose normal. No mucosal edema or rhinorrhea.  Mouth/Throat: Oropharynx is clear and moist and mucous membranes are normal. No dental abscesses or uvula swelling.  Eyes: Conjunctivae and EOM are normal. Pupils are equal, round, and reactive to light.  Neck: Normal range of motion and full passive range of motion without pain. Neck supple.  Cardiovascular: Normal rate, regular rhythm and normal heart sounds.  Exam reveals no gallop and no friction rub.   No murmur heard. Pulmonary/Chest: Accessory muscle usage present. Tachypnea noted. He is in respiratory distress. He has decreased breath sounds. He has wheezes. He has no rhonchi. He has no rales. He exhibits no tenderness and no crepitus.  Breath sounds very diminished with end expiratory wheezing. Tachypneic  Abdominal: Soft. Normal appearance and bowel sounds are normal. He exhibits no distension. There is no tenderness. There is no rebound and no guarding.  Musculoskeletal: Normal range of motion. He exhibits no edema or tenderness.  Moves all extremities well.   Neurological: He is alert and oriented to person, place, and time. He has normal strength. No cranial nerve deficit.  Skin: Skin is warm, dry and intact. No rash noted. No erythema. No pallor.  Psychiatric: He has a normal mood and affect. His speech is normal and behavior is normal. His mood  appears not anxious.  Nursing note and vitals reviewed.   ED Course  Procedures (including critical care time)  Medications  azithromycin (ZITHROMAX) 500 mg in dextrose 5 % 250 mL IVPB (0 mg Intravenous Stopped 07/16/15 0229)  albuterol (PROVENTIL,VENTOLIN) solution continuous neb (15 mg/hr Nebulization Given 07/15/15 2348)  ipratropium (ATROVENT) nebulizer solution 0.5 mg (0.5 mg Nebulization Given 07/15/15 2348)  methylPREDNISolone sodium succinate (SOLU-MEDROL) 125 mg/2 mL injection 125 mg (125 mg Intravenous Given 07/15/15 2357)  cefTRIAXone (ROCEPHIN) 1 g in dextrose 5 % 50 mL IVPB (0 g Intravenous Stopped 07/16/15 0059)    DIAGNOSTIC STUDIES: Oxygen Saturation is 96% on 2L/min.    COORDINATION OF CARE: 11:20 PM: Discussed treatment plan which includes breathing Tx and steroids with pt at bedside; patient verbalizes understanding and agrees with treatment plan.  Patient was rechecked at 3:30 AM. He states he's feeling much better. He is no longer  in distress like he was. On lung exam he has some improved air movement and no wheezing. He now tells me he was just treated for pneumonia and has finished those antibiotics. He is being discharged with Zithromax and steroids for his COPD exacerbation.  Labs Review Results for orders placed or performed during the hospital encounter of 07/15/15  CBC with Differential  Result Value Ref Range   WBC 8.5 4.0 - 10.5 K/uL   RBC 5.51 4.22 - 5.81 MIL/uL   Hemoglobin 17.1 (H) 13.0 - 17.0 g/dL   HCT 50.9 39.0 - 52.0 %   MCV 92.4 78.0 - 100.0 fL   MCH 31.0 26.0 - 34.0 pg   MCHC 33.6 30.0 - 36.0 g/dL   RDW 13.3 11.5 - 15.5 %   Platelets 192 150 - 400 K/uL   Neutrophils Relative % 53 %   Neutro Abs 4.5 1.7 - 7.7 K/uL   Lymphocytes Relative 33 %   Lymphs Abs 2.8 0.7 - 4.0 K/uL   Monocytes Relative 12 %   Monocytes Absolute 1.0 0.1 - 1.0 K/uL   Eosinophils Relative 2 %   Eosinophils Absolute 0.2 0.0 - 0.7 K/uL   Basophils Relative 0 %    Basophils Absolute 0.0 0.0 - 0.1 K/uL  Basic metabolic panel  Result Value Ref Range   Sodium 140 135 - 145 mmol/L   Potassium 4.4 3.5 - 5.1 mmol/L   Chloride 104 101 - 111 mmol/L   CO2 33 (H) 22 - 32 mmol/L   Glucose, Bld 91 65 - 99 mg/dL   BUN 8 6 - 20 mg/dL   Creatinine, Ser 0.94 0.61 - 1.24 mg/dL   Calcium 8.8 (L) 8.9 - 10.3 mg/dL   GFR calc non Af Amer >60 >60 mL/min   GFR calc Af Amer >60 >60 mL/min   Anion gap 3 (L) 5 - 15    Laboratory interpretation all normal without neutropenia   Imaging Review Dg Chest 2 View  07/15/2015   CLINICAL DATA:  Increasing shortness of breath since chemotherapy treatment 4 days ago. History of lung cancer.  EXAM: CHEST  2 VIEW  COMPARISON:  01/03/2015 and 12/16/2014  FINDINGS: Patient has undergone interval left lung surgery as there are surgical clips projected over the medial left upper thorax with volume loss of the left lung likely from previous partial lobectomy for known lung cancer. There is linear/heterogeneous density over the left mid to lower lung likely scarring. There is curvilinear opacification abutting the aortic arch in the medial left upper lung likely postsurgical. Cannot completely exclude infection in the left lung. No evidence of effusion. Right lung is clear. Cardiac silhouette is within normal. Remainder of the exam is unchanged.  IMPRESSION: Postsurgical change of the left lung and thorax likely related to surgery for patient's known lung cancer. The parenchymal changes a most suggestive of postsurgical scarring/atelectasis, although cannot completely exclude infection.   Electronically Signed   By: Marin Olp M.D.   On: 07/15/2015 20:33   I have personally reviewed and evaluated these images and lab results as part of my medical decision-making.   EKG Interpretation   Date/Time:  Thursday July 15 2015 21:56:58 EDT Ventricular Rate:  82 PR Interval:  138 QRS Duration: 86 QT Interval:  352 QTC Calculation: 411 R  Axis:   64 Text Interpretation:  Sinus rhythm Low voltage, extremity leads No  significant change since last tracing Confirmed by ZACKOWSKI  MD, SCOTT  ([id number]) on 07/15/2015 10:24:45 PM  MDM   Final diagnoses:  COPD with acute exacerbation   New Prescriptions   AZITHROMYCIN (ZITHROMAX) 250 MG TABLET    Take 1 po daily starting on Saturday morning   PREDNISONE (DELTASONE) 20 MG TABLET    Take 3 po QD x 3d , then 2 po QD x 3d then 1 po QD x 3d    Plan discharge  Rolland Porter, MD, FACEP   I personally performed the services described in this documentation, which was scribed in my presence. The recorded information has been reviewed and considered.  Rolland Porter, MD, Barbette Or, MD 07/16/15 [phone]

## 2015-07-16 DIAGNOSIS — J441 Chronic obstructive pulmonary disease with (acute) exacerbation: Secondary | ICD-10-CM | POA: Diagnosis not present

## 2015-07-16 MED ORDER — PREDNISONE 20 MG PO TABS
ORAL_TABLET | ORAL | Status: DC
Start: 2015-07-16 — End: 2015-12-09

## 2015-07-16 MED ORDER — AZITHROMYCIN 250 MG PO TABS: ORAL_TABLET | ORAL | Status: DC

## 2015-07-16 NOTE — Discharge Instructions (Signed)
You sure inhalers as needed for your shortness of breath and wheezing. Take the antibiotic once a day until gone starting either late Friday night or early Saturday morning. Take the prednisone until gone. Recheck if you get a high fever or you struggle to breathe and seem worse.

## 2015-12-09 ENCOUNTER — Inpatient Hospital Stay (HOSPITAL_COMMUNITY)
Admission: EM | Admit: 2015-12-09 | Discharge: 2015-12-12 | DRG: 190 | Disposition: A | Discharge status: Home / Self Care | Payer: Medicare HMO | Attending: Internal Medicine | Admitting: Internal Medicine

## 2015-12-09 ENCOUNTER — Emergency Department (HOSPITAL_COMMUNITY): Payer: Medicare HMO

## 2015-12-09 ENCOUNTER — Encounter (HOSPITAL_COMMUNITY): Payer: Self-pay | Admitting: Emergency Medicine

## 2015-12-09 DIAGNOSIS — G8929 Other chronic pain: Secondary | ICD-10-CM | POA: Diagnosis present

## 2015-12-09 DIAGNOSIS — J441 Chronic obstructive pulmonary disease with (acute) exacerbation: Principal | ICD-10-CM | POA: Diagnosis present

## 2015-12-09 DIAGNOSIS — I10 Essential (primary) hypertension: Secondary | ICD-10-CM | POA: Diagnosis not present

## 2015-12-09 DIAGNOSIS — Z85118 Personal history of other malignant neoplasm of bronchus and lung: Secondary | ICD-10-CM

## 2015-12-09 DIAGNOSIS — J9620 Acute and chronic respiratory failure, unspecified whether with hypoxia or hypercapnia: Secondary | ICD-10-CM | POA: Diagnosis present

## 2015-12-09 DIAGNOSIS — K219 Gastro-esophageal reflux disease without esophagitis: Secondary | ICD-10-CM

## 2015-12-09 DIAGNOSIS — J9621 Acute and chronic respiratory failure with hypoxia: Secondary | ICD-10-CM | POA: Diagnosis present

## 2015-12-09 DIAGNOSIS — J9601 Acute respiratory failure with hypoxia: Secondary | ICD-10-CM | POA: Diagnosis not present

## 2015-12-09 DIAGNOSIS — R739 Hyperglycemia, unspecified: Secondary | ICD-10-CM | POA: Diagnosis present

## 2015-12-09 DIAGNOSIS — C3491 Malignant neoplasm of unspecified part of right bronchus or lung: Secondary | ICD-10-CM

## 2015-12-09 HISTORY — DX: Malignant (primary) neoplasm, unspecified: C80.1

## 2015-12-09 LAB — CBC WITH DIFFERENTIAL/PLATELET
Basophils Absolute: 0 10*3/uL (ref 0.0–0.1)
Basophils Relative: 0 %
EOS ABS: 0 10*3/uL (ref 0.0–0.7)
Eosinophils Relative: 0 %
HCT: 51.7 % (ref 39.0–52.0)
HEMOGLOBIN: 17.4 g/dL — AB (ref 13.0–17.0)
Lymphocytes Relative: 20 %
Lymphs Abs: 1.3 10*3/uL (ref 0.7–4.0)
MCH: 31.2 pg (ref 26.0–34.0)
MCHC: 33.7 g/dL (ref 30.0–36.0)
MCV: 92.8 fL (ref 78.0–100.0)
MONO ABS: 1.1 10*3/uL — AB (ref 0.1–1.0)
Monocytes Relative: 17 %
NEUTROS ABS: 3.9 10*3/uL (ref 1.7–7.7)
Neutrophils Relative %: 63 %
Platelets: 190 10*3/uL (ref 150–400)
RBC: 5.57 MIL/uL (ref 4.22–5.81)
RDW: 13 % (ref 11.5–15.5)
WBC: 6.3 10*3/uL (ref 4.0–10.5)

## 2015-12-09 LAB — COMPREHENSIVE METABOLIC PANEL
ALK PHOS: 60 U/L (ref 38–126)
ALT: 23 U/L (ref 17–63)
AST: 22 U/L (ref 15–41)
Albumin: 4 g/dL (ref 3.5–5.0)
Anion gap: 7 (ref 5–15)
BILIRUBIN TOTAL: 0.5 mg/dL (ref 0.3–1.2)
BUN: 6 mg/dL (ref 6–20)
CALCIUM: 8.8 mg/dL — AB (ref 8.9–10.3)
CHLORIDE: 100 mmol/L — AB (ref 101–111)
CO2: 32 mmol/L (ref 22–32)
CREATININE: 0.94 mg/dL (ref 0.61–1.24)
GFR calc non Af Amer: >60 mL/min (ref >60)
GLUCOSE: 132 mg/dL — AB (ref 65–99)
Potassium: 3.8 mmol/L (ref 3.5–5.1)
Sodium: 139 mmol/L (ref 135–145)
Total Protein: 7.6 g/dL (ref 6.5–8.1)

## 2015-12-09 LAB — BLOOD GAS, ARTERIAL
ACID-BASE EXCESS: 5.4 mmol/L — AB (ref 0.0–2.0)
BICARBONATE: 27.3 meq/L — AB (ref 20.0–24.0)
DRAWN BY: 234301
O2 Content: 5 L/min
O2 Saturation: 89.6 %
PCO2 ART: 57.5 mmHg — AB (ref 35.0–45.0)
pH, Arterial: 7.348 — ABNORMAL LOW (ref 7.350–7.450)
pO2, Arterial: 59.3 mmHg — ABNORMAL LOW (ref 80.0–100.0)

## 2015-12-09 LAB — TROPONIN I

## 2015-12-09 LAB — CBG MONITORING, ED: GLUCOSE-CAPILLARY: 139 mg/dL — AB (ref 65–99)

## 2015-12-09 MED ORDER — SODIUM CHLORIDE 0.9% FLUSH
3.0000 mL | Freq: Two times a day (BID) | INTRAVENOUS | Status: DC
  Administered 2015-12-09 – 2015-12-12 (×6): 3 mL via INTRAVENOUS

## 2015-12-09 MED ORDER — SODIUM CHLORIDE 0.9% FLUSH: 3.0000 mL | INTRAVENOUS | Status: DC | PRN

## 2015-12-09 MED ORDER — ENOXAPARIN SODIUM 40 MG/0.4ML ~~LOC~~ SOLN: 40.0000 mg | SUBCUTANEOUS | Status: DC

## 2015-12-09 MED ORDER — OXYCODONE HCL 5 MG PO TABS
10.0000 mg | ORAL_TABLET | ORAL | Status: DC | PRN
  Administered 2015-12-09 – 2015-12-12 (×4): 10 mg via ORAL
  Filled 2015-12-09 (×4): qty 2

## 2015-12-09 MED ORDER — PANTOPRAZOLE SODIUM 40 MG PO TBEC: 80.0000 mg | DELAYED_RELEASE_TABLET | Freq: Every day | ORAL | Status: DC

## 2015-12-09 MED ORDER — SODIUM CHLORIDE 0.9 % IV SOLN: 250.0000 mL | INTRAVENOUS | Status: DC | PRN

## 2015-12-09 MED ORDER — METHYLPREDNISOLONE SODIUM SUCC 125 MG IJ SOLR
125.0000 mg | Freq: Once | INTRAMUSCULAR | Status: AC
  Administered 2015-12-09: 125 mg via INTRAVENOUS
  Filled 2015-12-09: qty 2

## 2015-12-09 MED ORDER — PANTOPRAZOLE SODIUM 40 MG PO TBEC
80.0000 mg | DELAYED_RELEASE_TABLET | Freq: Every day | ORAL | Status: DC
  Administered 2015-12-09 – 2015-12-12 (×4): 80 mg via ORAL
  Filled 2015-12-09 (×5): qty 2

## 2015-12-09 MED ORDER — SENNA 8.6 MG PO TABS
1.0000 | ORAL_TABLET | Freq: Two times a day (BID) | ORAL | Status: DC
  Administered 2015-12-09 – 2015-12-12 (×6): 8.6 mg via ORAL
  Filled 2015-12-09 (×6): qty 1

## 2015-12-09 MED ORDER — IPRATROPIUM-ALBUTEROL 0.5-2.5 (3) MG/3ML IN SOLN
3.0000 mL | RESPIRATORY_TRACT | Status: DC
  Administered 2015-12-09: 3 mL via RESPIRATORY_TRACT
  Filled 2015-12-09: qty 3

## 2015-12-09 MED ORDER — GUAIFENESIN ER 600 MG PO TB12
1200.0000 mg | ORAL_TABLET | Freq: Two times a day (BID) | ORAL | Status: DC
Start: 2015-12-09 — End: 2015-12-12
  Administered 2015-12-09 – 2015-12-12 (×6): 1200 mg via ORAL
  Filled 2015-12-09 (×6): qty 2

## 2015-12-09 MED ORDER — ALBUTEROL (5 MG/ML) CONTINUOUS INHALATION SOLN
10.0000 mg/h | INHALATION_SOLUTION | Freq: Once | RESPIRATORY_TRACT | Status: AC
  Administered 2015-12-09: 10 mg/h via RESPIRATORY_TRACT
  Filled 2015-12-09: qty 20

## 2015-12-09 MED ORDER — SENNOSIDES-DOCUSATE SODIUM 8.6-50 MG PO TABS: 2.0000 | ORAL_TABLET | Freq: Every evening | ORAL | Status: DC | PRN

## 2015-12-09 MED ORDER — PREDNISONE 20 MG PO TABS
60.0000 mg | ORAL_TABLET | Freq: Every day | ORAL | Status: DC
  Administered 2015-12-10: 60 mg via ORAL
  Filled 2015-12-09: qty 3

## 2015-12-09 MED ORDER — ENOXAPARIN SODIUM 60 MG/0.6ML ~~LOC~~ SOLN
50.0000 mg | SUBCUTANEOUS | Status: DC
  Administered 2015-12-09 – 2015-12-11 (×3): 50 mg via SUBCUTANEOUS
  Filled 2015-12-09 (×3): qty 0.6

## 2015-12-09 MED ORDER — LEVOFLOXACIN IN D5W 750 MG/150ML IV SOLN
750.0000 mg | INTRAVENOUS | Status: DC
  Administered 2015-12-09 – 2015-12-11 (×3): 750 mg via INTRAVENOUS
  Filled 2015-12-09 (×3): qty 150

## 2015-12-09 MED ORDER — ONDANSETRON HCL 4 MG/2ML IJ SOLN: 4.0000 mg | Freq: Four times a day (QID) | INTRAMUSCULAR | Status: DC | PRN

## 2015-12-09 MED ORDER — ACETAMINOPHEN 650 MG RE SUPP: 650.0000 mg | Freq: Four times a day (QID) | RECTAL | Status: DC | PRN

## 2015-12-09 MED ORDER — HYDROCHLOROTHIAZIDE 12.5 MG PO CAPS
12.5000 mg | ORAL_CAPSULE | Freq: Every day | ORAL | Status: DC
  Administered 2015-12-10 – 2015-12-12 (×3): 12.5 mg via ORAL
  Filled 2015-12-09 (×3): qty 1

## 2015-12-09 MED ORDER — LORAZEPAM 1 MG PO TABS: 1.0000 mg | ORAL_TABLET | Freq: Four times a day (QID) | ORAL | Status: DC | PRN

## 2015-12-09 MED ORDER — IPRATROPIUM-ALBUTEROL 0.5-2.5 (3) MG/3ML IN SOLN: 3.0000 mL | RESPIRATORY_TRACT | Status: DC | PRN

## 2015-12-09 MED ORDER — ONDANSETRON HCL 4 MG PO TABS: 4.0000 mg | ORAL_TABLET | Freq: Four times a day (QID) | ORAL | Status: DC | PRN

## 2015-12-09 MED ORDER — OXYCODONE HCL ER 10 MG PO T12A
EXTENDED_RELEASE_TABLET | ORAL | Status: AC
  Filled 2015-12-09: qty 1

## 2015-12-09 MED ORDER — ACETAMINOPHEN 325 MG PO TABS: 650.0000 mg | ORAL_TABLET | Freq: Four times a day (QID) | ORAL | Status: DC | PRN

## 2015-12-09 MED ORDER — POLYETHYLENE GLYCOL 3350 17 G PO PACK: 17.0000 g | PACK | Freq: Every day | ORAL | Status: DC | PRN

## 2015-12-09 MED ORDER — IPRATROPIUM-ALBUTEROL 0.5-2.5 (3) MG/3ML IN SOLN
3.0000 mL | RESPIRATORY_TRACT | Status: DC
  Administered 2015-12-09 – 2015-12-11 (×10): 3 mL via RESPIRATORY_TRACT
  Filled 2015-12-09 (×10): qty 3

## 2015-12-09 NOTE — ED Notes (Signed)
Pt oxygen saturation 87%-90% on 3L. MD made aware and re-evaluating patient. Pt oxygen increased to 90% on 4L.

## 2015-12-09 NOTE — ED Notes (Signed)
Pt called to nursing desk, states that he is having chest pain, when asked about the chest pain, pt states that he has chest pain everyday and takes oxycodone for his pain, oxycodone order is not in pxyis, AC notified,

## 2015-12-09 NOTE — ED Notes (Signed)
RT at bedside.

## 2015-12-09 NOTE — ED Notes (Signed)
CRITICAL VALUE ALERT  Critical value received:  PH 7.34, PC02 57.5, PO2 59.3, sat 89.6, Bicarb 27.3  Date of notification:  12/09/2015  Time of notification:  6314  Critical value read back:Yes.    Nurse who received alert:  Gaston Islam RN  MD notified (1st page):  Dr. Marily Memos  Time of first page:  1815  MD notified (2nd page):  Time of second page:  Responding MD:  Dr. Marily Memos  Time MD responded: [phone]

## 2015-12-09 NOTE — H&P (Signed)
[facility] History and Physical  Miguel Marsh OZD:[id number] DOB: December 06, 1960 DOA: 12/09/2015  Referring physician: Dr Jeneen Rinks PCP: Dr Doppler - [facility]  Chief Complaint: SOB  HPI: Miguel Marsh is a 55 y.o. male  2 days of cough. Productive. Associated with sensation of shortness of breath. Increased home O2 requirement. Patient on 2 L nasal cannula at baseline. Nebulizer treatments at home with short-lived improvement. Patient states he was up 30 minutes last night due to severity of symptoms. Denies any fevers, chest pain, LOC. Last chemotherapy treatment 3 days ago at [place] that his most recent CT scan showed that his lung cancer is almost completely resolved.   Review of Systems:  Constitutional:  No weight loss, night sweats, Fevers, chills, fatigue.  HEENT:  No headaches, Difficulty swallowing,Tooth/dental problems,Sore throat, Cardio-vascular:  No chest pain, Orthopnea, PND, swelling in lower extremities, anasarca, dizziness, palpitations  GI:  No heartburn, indigestion, abdominal pain, nausea, vomiting, diarrhea, change in bowel habits, loss of appetite  Resp: PeR HPI Skin:  no rash or lesions.  GU:  no dysuria, change in color of urine, no urgency or frequency. No flank pain.  Musculoskeletal:   No joint pain or swelling. No decreased range of motion. No back pain.  Psych:  No change in mood or affect. No depression or anxiety. No memory loss.  Neuro:  No change in sensation, unilateral strength, or cognitive abilities  All other systems were reviewed and are negative.  Past Medical History  Diagnosis Date  . Arthritis   . Bronchitis   . COPD (chronic obstructive pulmonary disease) ([place])   . HTN (hypertension)   . Cancer [facility])     metastatic NSCLC (followed by [facility])   Past Surgical History  Procedure Laterality Date  . Total hip arthroplasty    . Lung biopsy     Social History:  reports that he has been smoking Cigarettes.  He  has been smoking about 0.10 packs per day. He does not have any smokeless tobacco history on file. He reports that he drinks alcohol. He reports that he does not use illicit drugs.  No Known Allergies  History reviewed. No pertinent family history.   Prior to Admission medications   Medication Sig Start Date End Date Taking? Authorizing Provider  albuterol (PROVENTIL HFA;VENTOLIN HFA) 108 (90 BASE) MCG/ACT inhaler Inhale 2 puffs into the lungs every 6 (six) hours as needed for wheezing.   Yes Historical Provider, MD  albuterol (PROVENTIL) (2.5 MG/3ML) 0.083% nebulizer solution Take 2.5 mg by nebulization every 4 (four) hours as needed for wheezing or shortness of breath.  11/22/15  Yes Historical Provider, MD  hydrochlorothiazide (MICROZIDE) 12.5 MG capsule Take 1 capsule by mouth daily. 12/02/14  Yes Historical Provider, MD  LORazepam (ATIVAN) 1 MG tablet Take 1 mg by mouth every 8 (eight) hours as needed.  12/06/15  Yes Historical Provider, MD  naproxen sodium (ANAPROX) 220 MG tablet Take 440 mg by mouth daily as needed (for pain).   Yes Historical Provider, MD  omeprazole (PRILOSEC) 40 MG capsule Take 40 mg by mouth daily. 04/06/15  Yes Historical Provider, MD  Oxycodone HCl 10 MG TABS Take 10 mg by mouth every 4 (four) hours as needed (pain).  07/12/15  Yes Historical Provider, MD  prochlorperazine (COMPAZINE) 10 MG tablet Take 10 mg by mouth every 6 (six) hours as needed for nausea or vomiting.  07/12/15  Yes Historical Provider, MD  senna-docusate (SENOKOT-S) 8.6-50 MG per tablet Take  2 tablets by mouth at bedtime as needed for mild constipation.  06/15/15  Yes Historical Provider, MD  SYMBICORT 80-4.5 MCG/ACT inhaler Inhale 2 puffs into the lungs 2 (two) times daily. 12/02/14  Yes Historical Provider, MD  tiotropium (SPIRIVA) 18 MCG inhalation capsule Place 18 mcg into inhaler and inhale daily.   Yes Historical Provider, MD   Physical Exam: Filed Vitals:   12/09/15 1500 12/09/15 1600 12/09/15 1630  12/09/15 1700  BP: 125/79 128/66 123/80 119/82  Pulse: 94 115 110 107  Temp:      TempSrc:      Resp: '21 29 23 21  '$ Height:      Weight:      SpO2: 95% 95% 91% 89%    Wt Readings from Last 3 Encounters:  12/09/15 106.595 kg (235 lb)  07/15/15 106.595 kg (235 lb)  02/27/15 113.399 kg (250 lb)    General: Mild distress Eyes:  PERRL, EOMI, normal lids, iris ENT:  grossly normal hearing, lips & tongue Neck:  no LAD, masses or thyromegaly Cardiovascular:  RRR, no II/VI systoli cmurmur. No LE edema Respiratory: Diminished breath sounds throughout. Rhonchi and wheezes present. Mild increased effort. Abdomen:  soft, ntnd Skin:  no rash or induration seen on limited exam Musculoskeletal:  grossly normal tone BUE/BLE Psychiatric:  grossly normal mood and affect, speech fluent and appropriate Neurologic:  CN 2-12 grossly intact, moves all extremities in coordinated fashion.          Labs on Admission:  Basic Metabolic Panel:  Recent Labs Lab 12/09/15 1350  NA 139  K 3.8  CL 100*  CO2 32  GLUCOSE 132*  BUN 6  CREATININE 0.94  CALCIUM 8.8*   Liver Function Tests:  Recent Labs Lab 12/09/15 1350  AST 22  ALT 23  ALKPHOS 60  BILITOT 0.5  PROT 7.6  ALBUMIN 4.0   No results for input(s): LIPASE, AMYLASE in the last 168 hours. No results for input(s): AMMONIA in the last 168 hours. CBC:  Recent Labs Lab 12/09/15 1350  WBC 6.3  NEUTROABS 3.9  HGB 17.4*  HCT 51.7  MCV 92.8  PLT 190   Cardiac Enzymes: No results for input(s): CKTOTAL, CKMB, CKMBINDEX, TROPONINI in the last 168 hours.  BNP (last 3 results) No results for input(s): BNP in the last 8760 hours.  ProBNP (last 3 results) No results for input(s): PROBNP in the last 8760 hours.   CREATININE: 0.94 (12/09/15 1350) Estimated creatinine clearance - 104.6 mL/min  CBG: No results for input(s): GLUCAP in the last 168 hours.  Radiological Exams on Admission: Dg Chest 2 View  12/09/2015  CLINICAL  DATA:  Shortness of breath. Bilateral lung cancer. Tobacco use. EXAM: CHEST  2 VIEW COMPARISON:  07/15/2015 FINDINGS: Left basilar opacity measures 1.5 by 4.4 cm on the frontal projection and is probably in the lingula based on the lateral view. Other prior opacities along the aortic arch and left lung base have intervally resolved. Currently the right lung appears clear. Airway thickening is present, suggesting bronchitis or reactive airways disease. Old healed left lateral ninth rib fracture. IMPRESSION: 1. Nonspecific 1.5 by 4.4 cm left basilar opacity, similar to 07/15/2015 in morphology and appearance ; the other left-sided opacities have resolved from that exam. Given the provided history of lung cancer, it may be prudent to follow this with either radiography or chest CT in order to ensure that it represents scarring and not residual cancer, correlate with the patient's clinical situation. 2. Airway thickening  is present, suggesting bronchitis or reactive airways disease. Electronically Signed   By: Van Clines M.D.   On: 12/09/2015 14:29     Assessment/Plan Active Problems:   COPD with acute exacerbation (HCC)   Acute respiratory failure (HCC)   Essential hypertension   Non-small cell cancer of right lung (HCC)   GERD (gastroesophageal reflux disease)  COPD exacerbation in setting of lung cancer: New O2 requirement. Patient requiring 4 L nasal cannula. Baseline 2 L. Chest x-ray showing continuation of lung cancer without evidence of acute pneumonia. Patient improved after steroids (Solu-Medrol 125 ) and nebulizer treatments in ED. - MedSurg - Observation admission - Prednisone 60 mg daily - Duo nebs every 4, every 2 when necessary - Mucinex - Levaquin (no evidence of neutropenic fever) - O2 titration - ABG pending - Sputum culture  Lung cancer. Metastatic nsclc. Dx 04/2014. Followed by [facility]. Last Chemo 12/06/15. Marked regression of cancer since initiating therapy per pt.  Discussed transfer of care to [facility] and pt wishing to stay w/ [facility] team - continue outpt therapy  Hyperglycemia:  - A1c - CBG Q6  GERD: - continue ppi  Chronic pain: - continue oxycodone  HTN: - continue HCTZ  Code Status: FULL  DVT Prophylaxis: Lovenox Family Communication: none Disposition Plan: Pending Improvement    Miguel Marsh, Miguel Marsh Lenna Sciara, MD Family Medicine [facility] [email or web address] Password [email or web address]

## 2015-12-09 NOTE — ED Notes (Signed)
PT states he had chemo on 12/06/15 and has had SOB at all times x3 days. PT denies any chest pain. PT states chemo treatment for bilateral lung cancer and treatment in [place]. PT on 2L O2 continuous.

## 2015-12-09 NOTE — ED Notes (Signed)
MD at bedside. 

## 2015-12-09 NOTE — ED Provider Notes (Signed)
CSN: [id number]     Arrival date & time 12/09/15  1318 History   First MD Initiated Contact with Patient 12/09/15 1342     Chief Complaint  Patient presents with  . Shortness of Breath      HPI  Patient with history of lung cancer undergoing chemotherapy at [facility], and COPD on home nebulizers and 2 L nasal cannula O2 presents with shortness of breath over the last 2-3 days.  Lung cancer diagnosed approximately July of last year. Most recent chemotherapy was 3 days ago at [facility]. He has had cough and progressive shortness of breath and wheezing refractory to home nebulizer treatments for the last 3 days. He presents here.  He was immunized for influenza. He has not had fever shakes or chills. Dry nonproductive cough. He has no chest pain. Not lightheaded or syncopal. Has given himself 2 nebulizer treatments at home. 1 last night. No improvement.  Past Medical History  Diagnosis Date  . Arthritis   . Bronchitis   . COPD (chronic obstructive pulmonary disease) ([place])   . HTN (hypertension)   . Cancer [facility])    Past Surgical History  Procedure Laterality Date  . Total hip arthroplasty    . Lung biopsy     History reviewed. No pertinent family history. Social History  Substance Use Topics  . Smoking status: Current Every Day Smoker -- 0.10 packs/day    Types: Cigarettes  . Smokeless tobacco: None  . Alcohol Use: Yes     Comment: occ    Review of Systems  Constitutional: Negative for fever, chills, diaphoresis, appetite change and fatigue.  HENT: Negative for mouth sores, sore throat and trouble swallowing.   Eyes: Negative for visual disturbance.  Respiratory: Positive for cough, shortness of breath and wheezing. Negative for chest tightness.   Cardiovascular: Negative for chest pain.  Gastrointestinal: Negative for nausea, vomiting, abdominal pain, diarrhea and abdominal distention.  Endocrine: Negative for polydipsia, polyphagia and polyuria.  Genitourinary: Negative  for dysuria, frequency and hematuria.  Musculoskeletal: Negative for gait problem.  Skin: Negative for color change, pallor and rash.  Neurological: Negative for dizziness, syncope, light-headedness and headaches.  Hematological: Does not bruise/bleed easily.  Psychiatric/Behavioral: Negative for behavioral problems and confusion.      Allergies  Review of patient's allergies indicates no known allergies.  Home Medications   Prior to Admission medications   Medication Sig Start Date End Date Taking? Authorizing Provider  albuterol (PROVENTIL HFA;VENTOLIN HFA) 108 (90 BASE) MCG/ACT inhaler Inhale 2 puffs into the lungs every 6 (six) hours as needed for wheezing.   Yes Historical Provider, MD  albuterol (PROVENTIL) (2.5 MG/3ML) 0.083% nebulizer solution Take 2.5 mg by nebulization every 4 (four) hours as needed for wheezing or shortness of breath.  11/22/15  Yes Historical Provider, MD  hydrochlorothiazide (MICROZIDE) 12.5 MG capsule Take 1 capsule by mouth daily. 12/02/14  Yes Historical Provider, MD  LORazepam (ATIVAN) 1 MG tablet Take 1 mg by mouth every 8 (eight) hours as needed.  12/06/15  Yes Historical Provider, MD  naproxen sodium (ANAPROX) 220 MG tablet Take 440 mg by mouth daily as needed (for pain).   Yes Historical Provider, MD  omeprazole (PRILOSEC) 40 MG capsule Take 40 mg by mouth daily. 04/06/15  Yes Historical Provider, MD  Oxycodone HCl 10 MG TABS Take 10 mg by mouth every 4 (four) hours as needed (pain).  07/12/15  Yes Historical Provider, MD  prochlorperazine (COMPAZINE) 10 MG tablet Take 10 mg by mouth  every 6 (six) hours as needed for nausea or vomiting.  07/12/15  Yes Historical Provider, MD  senna-docusate (SENOKOT-S) 8.6-50 MG per tablet Take 2 tablets by mouth at bedtime as needed for mild constipation.  06/15/15  Yes Historical Provider, MD  SYMBICORT 80-4.5 MCG/ACT inhaler Inhale 2 puffs into the lungs 2 (two) times daily. 12/02/14  Yes Historical Provider, MD  tiotropium  (SPIRIVA) 18 MCG inhalation capsule Place 18 mcg into inhaler and inhale daily.   Yes Historical Provider, MD   BP 123/80 mmHg  Pulse 110  Temp(Src) 98.3 F (36.8 C) (Oral)  Resp 23  Ht '5\' 7"'$  (1.702 m)  Wt 235 lb (106.595 kg)  BMI 36.80 kg/m2  SpO2 89% Physical Exam  Constitutional: He is oriented to person, place, and time. He appears well-developed and well-nourished. No distress.  HENT:  Head: Normocephalic.  Eyes: Conjunctivae are normal. Pupils are equal, round, and reactive to light. No scleral icterus.  Neck: Normal range of motion. Neck supple. No thyromegaly present.  Cardiovascular: Normal rate and regular rhythm.  Exam reveals no gallop and no friction rub.   No murmur heard. Pulmonary/Chest: Accessory muscle usage present. He is in respiratory distress. He has decreased breath sounds in the right upper field, the right middle field, the right lower field, the left upper field, the left middle field and the left lower field. He has wheezes in the right upper field, the right middle field, the right lower field, the left upper field, the left middle field and the left lower field. He has no rhonchi. He has no rales.  Abdominal: Soft. Bowel sounds are normal. He exhibits no distension. There is no tenderness. There is no rebound.  Musculoskeletal: Normal range of motion.  Neurological: He is alert and oriented to person, place, and time.  Skin: Skin is warm and dry. No rash noted.  Psychiatric: He has a normal mood and affect. His behavior is normal.    ED Course  Procedures (including critical care time) Labs Review Labs Reviewed  CBC WITH DIFFERENTIAL/PLATELET - Abnormal; Notable for the following:    Hemoglobin 17.4 (*)    Monocytes Absolute 1.1 (*)    All other components within normal limits  COMPREHENSIVE METABOLIC PANEL - Abnormal; Notable for the following:    Chloride 100 (*)    Glucose, Bld 132 (*)    Calcium 8.8 (*)    All other components within normal  limits  BLOOD GAS, ARTERIAL    Imaging Review Dg Chest 2 View  12/09/2015  CLINICAL DATA:  Shortness of breath. Bilateral lung cancer. Tobacco use. EXAM: CHEST  2 VIEW COMPARISON:  07/15/2015 FINDINGS: Left basilar opacity measures 1.5 by 4.4 cm on the frontal projection and is probably in the lingula based on the lateral view. Other prior opacities along the aortic arch and left lung base have intervally resolved. Currently the right lung appears clear. Airway thickening is present, suggesting bronchitis or reactive airways disease. Old healed left lateral ninth rib fracture. IMPRESSION: 1. Nonspecific 1.5 by 4.4 cm left basilar opacity, similar to 07/15/2015 in morphology and appearance ; the other left-sided opacities have resolved from that exam. Given the provided history of lung cancer, it may be prudent to follow this with either radiography or chest CT in order to ensure that it represents scarring and not residual cancer, correlate with the patient's clinical situation. 2. Airway thickening is present, suggesting bronchitis or reactive airways disease. Electronically Signed   By: Cindra Eves.D.  On: 12/09/2015 14:29   I have personally reviewed and evaluated these images and lab results as part of my medical decision-making.   EKG Interpretation   Date/Time:  Thursday December 09 2015 13:39:33 EST Ventricular Rate:  103 PR Interval:  139 QRS Duration: 100 QT Interval:  368 QTC Calculation: 482 R Axis:   91 Text Interpretation:  Sinus tachycardia Multiple premature complexes, vent  & supraven Borderline right axis deviation Minimal ST depression, lateral  leads Borderline prolonged QT interval Confirmed by Jeneen Rinks  MD, [place]  [phone]) on 12/09/2015 1:48:29 PM      MDM   Final diagnoses:  COPD exacerbation ([place])     CRITICAL CARE Performed by: Lolita Patella   Total critical care time: 90 minutes of continuous nebulized albuterol treatment with frequent  re-evaluations.  Critical care time was exclusive of separately billable procedures and treating other patients.  Critical care was necessary to treat or prevent imminent or life-threatening deterioration.  Critical care was time spent personally by me on the following activities: development of treatment plan with patient and/or surrogate as well as nursing, discussions with consultants, evaluation of patient's response to treatment, examination of patient, obtaining history from patient or surrogate, ordering and performing treatments and interventions, ordering and review of laboratory studies, ordering and review of radiographic studies, pulse oximetry and re-evaluation of patient's condition.  On arrival patient started on a continuous neb treatment. 10 mg over 90 minutes. Given IV solu- Medrol. Chest x-ray does not show acute process. No infiltrate or fluid. Persistance of one, of previously multiple lung masses.    He has no chest pain. Doubt pulmonary embolus.. He does not have infiltrate on chest x-ray. He is not febrile. He was immunized for influenza. He has a markedly abnormal lung exam with wheezing and prolongation in all fields. This does improve after this first continuous nebulized treatment. He states he is feeling better. However, states he is still not at his baseline dyspnea. He is requiring between 3 and 5 L of O2. On exam has continues to have persistence of wheezing, and diminished breath sounds.. Given additional DuoNeb here. Continues to be distant and prolonged with his breath sounds on pulmonary exam.    I will speak with the hospitalist regarding admission.    Tanna Furry, MD 12/09/15 [phone]

## 2015-12-09 NOTE — ED Notes (Signed)
RT paged.

## 2015-12-09 NOTE — ED Notes (Addendum)
Dr Marily Memos notified of pt's chest pain, additional orders given, Repeat ekg performed, given to edp

## 2015-12-10 DIAGNOSIS — J9621 Acute and chronic respiratory failure with hypoxia: Secondary | ICD-10-CM

## 2015-12-10 DIAGNOSIS — Z85118 Personal history of other malignant neoplasm of bronchus and lung: Secondary | ICD-10-CM | POA: Diagnosis not present

## 2015-12-10 DIAGNOSIS — G8929 Other chronic pain: Secondary | ICD-10-CM | POA: Diagnosis present

## 2015-12-10 DIAGNOSIS — K219 Gastro-esophageal reflux disease without esophagitis: Secondary | ICD-10-CM

## 2015-12-10 DIAGNOSIS — I10 Essential (primary) hypertension: Secondary | ICD-10-CM

## 2015-12-10 DIAGNOSIS — J441 Chronic obstructive pulmonary disease with (acute) exacerbation: Principal | ICD-10-CM

## 2015-12-10 DIAGNOSIS — R739 Hyperglycemia, unspecified: Secondary | ICD-10-CM | POA: Diagnosis present

## 2015-12-10 DIAGNOSIS — J9601 Acute respiratory failure with hypoxia: Secondary | ICD-10-CM | POA: Diagnosis not present

## 2015-12-10 DIAGNOSIS — C3491 Malignant neoplasm of unspecified part of right bronchus or lung: Secondary | ICD-10-CM | POA: Diagnosis present

## 2015-12-10 LAB — HEMOGLOBIN A1C
HEMOGLOBIN A1C: 6 % — AB (ref 4.8–5.6)
Mean Plasma Glucose: 126 mg/dL

## 2015-12-10 LAB — GLUCOSE, CAPILLARY
GLUCOSE-CAPILLARY: 101 mg/dL — AB (ref 65–99)
GLUCOSE-CAPILLARY: 116 mg/dL — AB (ref 65–99)
GLUCOSE-CAPILLARY: 151 mg/dL — AB (ref 65–99)
Glucose-Capillary: 148 mg/dL — ABNORMAL HIGH (ref 65–99)

## 2015-12-10 MED ORDER — BUDESONIDE-FORMOTEROL FUMARATE 160-4.5 MCG/ACT IN AERO
2.0000 | INHALATION_SPRAY | Freq: Two times a day (BID) | RESPIRATORY_TRACT | Status: DC
  Administered 2015-12-10 – 2015-12-12 (×4): 2 via RESPIRATORY_TRACT
  Filled 2015-12-10: qty 6

## 2015-12-10 MED ORDER — METHYLPREDNISOLONE SODIUM SUCC 125 MG IJ SOLR
80.0000 mg | Freq: Four times a day (QID) | INTRAMUSCULAR | Status: DC
  Administered 2015-12-10 – 2015-12-12 (×8): 80 mg via INTRAVENOUS
  Filled 2015-12-10 (×8): qty 2

## 2015-12-10 NOTE — Progress Notes (Signed)
[facility] PROGRESS NOTE  Miguel Marsh TGY:[id number] DOB: [birth year] DOA: 12/09/2015 PCP: PROVIDER NOT IN SYSTEM  Assessment/Plan: 1. Acute on chronic hypoxic respiratory failure, currently requiring 8L of O2 via high flow nasal cannula. Will wean as tolerated. Patient reports that he uses 2L O2 PRN at home. 2. COPD with acute exacerbation. CXR shows no evidence of PNA. Since patient has an increase in O2 requirement and is still very short of breath, we will change prednisone to IV steroids. Will continue  IV abx, nebs, and pulmonary hygiene. Will wean O2 as tolerated. 3. Essential hypertension, stable. Continue HCTZ 4. Metastatic Non-small cell cancer of right lung. Followed by [facility] with last chemo 12/06/15.  5. GERD, continue ppi 6. Hyperglycemia, stable. HGB A1C has been ordered. Will continue to monitor. 7. Chronic pain, continue oxycodone  Code Status: Full DVT prophylaxis: Lovenox Family Communication: no family at bedside Disposition Plan: Discharge when improved   Consultants:  none  Procedures:  none  Antibiotics:  Levaquin 2/09 >>  HPI/Subjective: Patient reports that he feels good today, and that condition has improved since arriving at hospital. Is still having a productive cough. Uses 2L O2 PRN at home, but has been needing it more frequently  Objective: Filed Vitals:   12/09/15 2113 12/10/15 0609  BP: 115/54 140/82  Pulse: 101 100  Temp: 98.7 F (37.1 C) 98.2 F (36.8 C)  Resp: 20 20    Intake/Output Summary (Last 24 hours) at 12/10/15 1149 Last data filed at 12/10/15 0700  Gross per 24 hour  Intake    630 ml  Output      0 ml  Net    630 ml   Filed Weights   12/09/15 1324 12/09/15 2113  Weight: 106.595 kg (235 lb) 104.055 kg (229 lb 6.4 oz)    Exam:  General: NAD, looks comfortable Cardiovascular: RRR, S1, S2  Respiratory: bilateral wheezes Abdomen: soft, non tender, no distention , bowel sounds normal Musculoskeletal: No  edema b/l   Data Reviewed: Basic Metabolic Panel:  Recent Labs Lab 12/09/15 1350  NA 139  K 3.8  CL 100*  CO2 32  GLUCOSE 132*  BUN 6  CREATININE 0.94  CALCIUM 8.8*   Liver Function Tests:  Recent Labs Lab 12/09/15 1350  AST 22  ALT 23  ALKPHOS 60  BILITOT 0.5  PROT 7.6  ALBUMIN 4.0   CBC:  Recent Labs Lab 12/09/15 1350  WBC 6.3  NEUTROABS 3.9  HGB 17.4*  HCT 51.7  MCV 92.8  PLT 190   Cardiac Enzymes:  Recent Labs Lab 12/09/15 1947  TROPONINI <0.03    Studies: Dg Chest 2 View  12/09/2015  CLINICAL DATA:  Shortness of breath. Bilateral lung cancer. Tobacco use. EXAM: CHEST  2 VIEW COMPARISON:  07/15/2015 FINDINGS: Left basilar opacity measures 1.5 by 4.4 cm on the frontal projection and is probably in the lingula based on the lateral view. Other prior opacities along the aortic arch and left lung base have intervally resolved. Currently the right lung appears clear. Airway thickening is present, suggesting bronchitis or reactive airways disease. Old healed left lateral ninth rib fracture. IMPRESSION: 1. Nonspecific 1.5 by 4.4 cm left basilar opacity, similar to 07/15/2015 in morphology and appearance ; the other left-sided opacities have resolved from that exam. Given the provided history of lung cancer, it may be prudent to follow this with either radiography or chest CT in order to ensure that it represents scarring and not residual cancer, correlate  with the patient's clinical situation. 2. Airway thickening is present, suggesting bronchitis or reactive airways disease. Electronically Signed   By: Van Clines M.D.   On: 12/09/2015 14:29    Scheduled Meds: . enoxaparin (LOVENOX) injection  50 mg Subcutaneous Q24H  . guaiFENesin  1,200 mg Oral BID  . hydrochlorothiazide  12.5 mg Oral Daily  . ipratropium-albuterol  3 mL Nebulization Q4H  . levofloxacin (LEVAQUIN) IV  750 mg Intravenous Q24H  . pantoprazole  80 mg Oral Daily  . predniSONE  60 mg Oral  Q breakfast  . senna  1 tablet Oral BID  . sodium chloride flush  3 mL Intravenous Q12H   Continuous Infusions:   Active Problems:   COPD with acute exacerbation (HCC)   Acute respiratory failure (HCC)   Essential hypertension   Non-small cell cancer of right lung (HCC)   GERD (gastroesophageal reflux disease)    Time spent: 25 minutes    Kathie Dike, MD. [facility] Pager [phone]. If 7PM-7AM, please contact night-coverage at [email or web address], password [facility] 12/10/2015, 11:49 AM      By signing my name below, I, Delene Ruffini, attest that this documentation has been prepared under the direction and in the presence of Kathie Dike, MD. Electronically Signed: Delene Ruffini 12/10/2015   12:48pm   I, Dr. Kathie Dike, personally performed the services described in this documentaiton. All medical record entries made by the scribe were at my direction and in my presence. I have reviewed the chart and agree that the record reflects my personal performance and is accurate and complete  Kathie Dike, MD, 12/10/2015 1:03 PM

## 2015-12-10 NOTE — Progress Notes (Signed)
OT Screen Note  Patient Details Name: EUNICE OLDAKER MRN: [id number] DOB: Nov 27, 1960   Cancelled Treatment:    Reason Eval/Treat Not Completed: OT screened, no needs identified, will sign off  Ailene Ravel, OTR/L,CBIS  [phone]  12/10/2015, 8:56 AM

## 2015-12-10 NOTE — Care Management Note (Addendum)
Case Management Note  Patient Details  Name: Miguel Marsh MRN: [id number] Date of Birth: 1961/07/31  Subjective/Objective:                  Pt admitted from home, lives alone and is mostly ind with ADL's at baseline. Pt has home O2 and neb machine prior to admission. Pt uses [facility] for transportation to appointments. Pt feels he is weak and would benefit from nursing and aid services temporarily at DC. Pt has chosen [facility] for [facility] serivces.   Action/Plan: Kris Hartmann, of [facility], made aware of [facility] referral and will obtain pt info from chart. Pt aware HH has 48 hours to initiate services at DC. Anticipate possible DC this weekend, staff will notify [facility] if pt is discharged over weekend.    Expected Discharge Date:      12/11/2015            Expected Discharge Plan:  [place]  In-House Referral:  NA  Discharge planning Services  CM Consult  Post Acute Care Choice:  NA Choice offered to:  NA  DME Arranged:    DME Agency:     HH Arranged:  RN, Nurse's Aide [place] Agency:  [place]  Status of Service:  Completed, signed off  Medicare Important Message Given:    Date Medicare IM Given:    Medicare IM give by:    Date Additional Medicare IM Given:    Additional Medicare Important Message give by:     If discussed at [place] of Stay Meetings, dates discussed:    Additional Comments:  Sherald Barge, RN 12/10/2015, 12:59 PM

## 2015-12-10 NOTE — Progress Notes (Signed)
PT Cancellation Note  Patient Details Name: Miguel Marsh MRN: [id number] DOB: 1960-12-11   Cancelled Treatment:    Reason Eval/Treat Not Completed: PT screened, no needs identified, will sign off   Sable Feil  PT 12/10/2015, 10:48 AM

## 2015-12-10 NOTE — Care Management Obs Status (Signed)
[place] NOTIFICATION   Patient Details  Name: Miguel Marsh MRN: [id number] Date of Birth: Jan 18, 1961   Medicare Observation Status Notification Given:  Yes    Sherald Barge, RN 12/10/2015, 12:58 PM

## 2015-12-11 DIAGNOSIS — J9601 Acute respiratory failure with hypoxia: Secondary | ICD-10-CM

## 2015-12-11 LAB — CBC
HCT: 52.2 % — ABNORMAL HIGH (ref 39.0–52.0)
HEMOGLOBIN: 16.5 g/dL (ref 13.0–17.0)
MCH: 30.4 pg (ref 26.0–34.0)
MCHC: 31.6 g/dL (ref 30.0–36.0)
MCV: 96.1 fL (ref 78.0–100.0)
Platelets: 193 10*3/uL (ref 150–400)
RBC: 5.43 MIL/uL (ref 4.22–5.81)
RDW: 13 % (ref 11.5–15.5)
WBC: 6.8 10*3/uL (ref 4.0–10.5)

## 2015-12-11 LAB — BASIC METABOLIC PANEL
ANION GAP: 6 (ref 5–15)
BUN: 12 mg/dL (ref 6–20)
CHLORIDE: 96 mmol/L — AB (ref 101–111)
CO2: 37 mmol/L — ABNORMAL HIGH (ref 22–32)
Calcium: 9.3 mg/dL (ref 8.9–10.3)
Creatinine, Ser: 0.96 mg/dL (ref 0.61–1.24)
GFR calc Af Amer: >60 mL/min (ref >60)
GFR calc non Af Amer: >60 mL/min (ref >60)
GLUCOSE: 150 mg/dL — AB (ref 65–99)
POTASSIUM: 4.8 mmol/L (ref 3.5–5.1)
SODIUM: 139 mmol/L (ref 135–145)

## 2015-12-11 LAB — GLUCOSE, CAPILLARY
GLUCOSE-CAPILLARY: 179 mg/dL — AB (ref 65–99)
Glucose-Capillary: 177 mg/dL — ABNORMAL HIGH (ref 65–99)

## 2015-12-11 MED ORDER — IPRATROPIUM-ALBUTEROL 0.5-2.5 (3) MG/3ML IN SOLN
3.0000 mL | Freq: Three times a day (TID) | RESPIRATORY_TRACT | Status: DC
  Administered 2015-12-11 – 2015-12-12 (×3): 3 mL via RESPIRATORY_TRACT
  Filled 2015-12-11 (×3): qty 3

## 2015-12-11 NOTE — Progress Notes (Addendum)
CM spoke with patient regarding [place] services.  Patient has portable oxygen tank with him to use when transported home. CM contacted [facility] and faxed demographics, HH order and H&P for processing.  CM was able to fax discharge summary to [facility].  Confirmed information was received that was faxed.  Patient to be contacted on 12/13/15.

## 2015-12-11 NOTE — Progress Notes (Signed)
[facility] PROGRESS NOTE  Miguel Marsh ZOX:[id number] DOB: 02-09-1961 DOA: 12/09/2015 PCP: PROVIDER NOT IN SYSTEM  Assessment/Plan: 1. Acute on chronic hypoxic respiratory failure, currently requiring 4.5L of high flow O2 via [place]. Will wean to baseline 2L as tolerated.   2. COPD with acute exacerbation, slowly improving. CXR shows no evidence of PNA. Will continue  IV steroids, abx, nebs, and pulmonary hygiene. Will wean O2 as tolerated. 3. Essential hypertension, stable. Continue HCTZ 4. Metastatic Non-small cell cancer of right lung. Followed by [facility] with last chemo 12/06/15.  5. GERD, continue ppi 6. Hyperglycemia. HGB A1C 6.0. Hyperglycemia likely related to steroids. Blood sugars are currently within reasonable control. Will continue to monitor. 7. Chronic pain, continue oxycodone  Code Status: Full DVT prophylaxis: Lovenox Family Communication: no family at bedside  Disposition Plan: Anticipate discharge within 1-2 days.    Consultants:  none  Procedures:  none  Antibiotics:  Levaquin 2/09 >>  HPI/Subjective: Denies any CP and states his SOB is improving.   Objective: Filed Vitals:   12/10/15 2108 12/11/15 0551  BP: 132/76 140/68  Pulse: 98 86  Temp: 98 F (36.7 C) 97.6 F (36.4 C)  Resp: 18 18    Intake/Output Summary (Last 24 hours) at 12/11/15 0825 Last data filed at 12/11/15 8119  Gross per 24 hour  Intake   1200 ml  Output      0 ml  Net   1200 ml   Filed Weights   12/09/15 1324 12/09/15 2113  Weight: 106.595 kg (235 lb) 104.055 kg (229 lb 6.4 oz)    Exam:  General: NAD, looks comfortable Cardiovascular: RRR, S1, S2  Respiratory: diminished breath sounds, no wheezing Abdomen: soft, non tender, no distention , bowel sounds normal Musculoskeletal: No edema b/l   Data Reviewed: Basic Metabolic Panel:  Recent Labs Lab 12/09/15 1350 12/11/15 0616  NA 139 139  K 3.8 4.8  CL 100* 96*  CO2 32 37*  GLUCOSE 132* 150*  BUN 6  12  CREATININE 0.94 0.96  CALCIUM 8.8* 9.3   Liver Function Tests:  Recent Labs Lab 12/09/15 1350  AST 22  ALT 23  ALKPHOS 60  BILITOT 0.5  PROT 7.6  ALBUMIN 4.0   CBC:  Recent Labs Lab 12/09/15 1350 12/11/15 0616  WBC 6.3 6.8  NEUTROABS 3.9  --   HGB 17.4* 16.5  HCT 51.7 52.2*  MCV 92.8 96.1  PLT 190 193   Cardiac Enzymes:  Recent Labs Lab 12/09/15 1947  TROPONINI <0.03    Studies: Dg Chest 2 View  12/09/2015  CLINICAL DATA:  Shortness of breath. Bilateral lung cancer. Tobacco use. EXAM: CHEST  2 VIEW COMPARISON:  07/15/2015 FINDINGS: Left basilar opacity measures 1.5 by 4.4 cm on the frontal projection and is probably in the lingula based on the lateral view. Other prior opacities along the aortic arch and left lung base have intervally resolved. Currently the right lung appears clear. Airway thickening is present, suggesting bronchitis or reactive airways disease. Old healed left lateral ninth rib fracture. IMPRESSION: 1. Nonspecific 1.5 by 4.4 cm left basilar opacity, similar to 07/15/2015 in morphology and appearance ; the other left-sided opacities have resolved from that exam. Given the provided history of lung cancer, it may be prudent to follow this with either radiography or chest CT in order to ensure that it represents scarring and not residual cancer, correlate with the patient's clinical situation. 2. Airway thickening is present, suggesting bronchitis or reactive airways disease.  Electronically Signed   By: Van Clines M.D.   On: 12/09/2015 14:29    Scheduled Meds: . budesonide-formoterol  2 puff Inhalation BID  . enoxaparin (LOVENOX) injection  50 mg Subcutaneous Q24H  . guaiFENesin  1,200 mg Oral BID  . hydrochlorothiazide  12.5 mg Oral Daily  . ipratropium-albuterol  3 mL Nebulization TID  . levofloxacin (LEVAQUIN) IV  750 mg Intravenous Q24H  . methylPREDNISolone (SOLU-MEDROL) injection  80 mg Intravenous Q6H  . pantoprazole  80 mg Oral Daily   . senna  1 tablet Oral BID  . sodium chloride flush  3 mL Intravenous Q12H   Continuous Infusions:   Active Problems:   COPD with acute exacerbation (HCC)   Acute respiratory failure (HCC)   Essential hypertension   Non-small cell cancer of right lung (HCC)   GERD (gastroesophageal reflux disease)    Time spent: 25 minutes    Kathie Dike, MD. [facility] Pager [phone]. If 7PM-7AM, please contact night-coverage at [email or web address], password [facility] 12/11/2015, 8:25 AM  LOS: 1 day    By signing my name below, I, Rosalie Doctor, attest that this documentation has been prepared under the direction and in the presence of [facility]. MD Electronically Signed: Rosalie Doctor, Scribe. 12/11/2015 2:01pm  I, Dr. Kathie Dike, personally performed the services described in this documentaiton. All medical record entries made by the scribe were at my direction and in my presence. I have reviewed the chart and agree that the record reflects my personal performance and is accurate and complete  Kathie Dike, MD, 12/11/2015 2:06 PM

## 2015-12-12 LAB — GLUCOSE, CAPILLARY
GLUCOSE-CAPILLARY: 170 mg/dL — AB (ref 65–99)
Glucose-Capillary: 150 mg/dL — ABNORMAL HIGH (ref 65–99)
Glucose-Capillary: 133 mg/dL — ABNORMAL HIGH (ref 65–99)

## 2015-12-12 MED ORDER — PREDNISONE 10 MG PO TABS: ORAL_TABLET | ORAL | Status: DC

## 2015-12-12 MED ORDER — GUAIFENESIN ER 600 MG PO TB12
600.0000 mg | ORAL_TABLET | Freq: Two times a day (BID) | ORAL | Status: DC
Start: 2015-12-12 — End: 2018-07-17

## 2015-12-12 MED ORDER — NICOTINE 7 MG/24HR TD PT24: 7.0000 mg | MEDICATED_PATCH | Freq: Every day | TRANSDERMAL | Status: DC

## 2015-12-12 MED ORDER — LEVOFLOXACIN 750 MG PO TABS: 750.0000 mg | ORAL_TABLET | Freq: Every day | ORAL | Status: DC

## 2015-12-12 NOTE — Discharge Summary (Signed)
Physician Discharge Summary  Miguel Marsh YQI:[id number] DOB: April 07, 1961 DOA: 12/09/2015  PCP: PROVIDER NOT IN SYSTEM  Admit date: 12/09/2015 Discharge date: 12/12/2015  Time spent: 35  minutes  Recommendations for Outpatient Follow-up:  1. Follow up with PCP in 1-2 weeks. 2. Follow up with pulmonologist at [facility] on 2/26 as scheduled.    Discharge Diagnoses:  Active Problems:   COPD with acute exacerbation (HCC)   Acute on chronic respiratory failure (HCC)   Essential hypertension   Non-small cell cancer of right lung (HCC)   GERD (gastroesophageal reflux disease)   Discharge Condition: Improved  Diet recommendation: Heart healthy  Filed Weights   12/09/15 1324 12/09/15 2113  Weight: 106.595 kg (235 lb) 104.055 kg (229 lb 6.4 oz)    History of present illness:  79 yom with a hx of COPD and non-small cell cancer of the right lung presented with SOB and an increased home O2 requirement. While in the ED, he was started on a continuous nebulizer treatment with minimal improvement. He was admitted for further management of a COPD exacerbation.   Hospital Course:  Patient presented with SOB and an increased home O2 requirement. He was found to have a COPD exacerbation. CXR had no evidence of infiltrate. He was started on IV steroids, IV abx, nebulizers, and pulmonary hygiene with significant improvement. Patient feels as though his breathing is approaching baseline. He will be discharged on an oral steroid taper, oral abx, and bronchodilators.  Patient was also noted to have acute on chronic hypoxic respiratory failure, secondary to his COPD. Initially, he required 15L of HFNC but has since been weaned down to his baseline 2L. He is breathing comfortably at his baseline 2L [place].  . 1. Essential hypertension, stable. Continue HCTZ 2. Metastatic Non-small cell cancer of right lung. Followed by [facility] with last chemo 12/06/15.  3. GERD, continue ppi 4. Hyperglycemia. HGB A1C  6.0. Hyperglycemia likely related to steroids. Blood sugars are currently within reasonable control.  5. Chronic pain, continue oxycodone 6. Tobacco abuse, counseled on the importance of cessation. He will be discharged with Nicotine patches.  Procedures:  none  Consultations:  none  Discharge Exam: Filed Vitals:   12/11/15 2122 12/12/15 0541  BP: 99/45 114/84  Pulse: 96 83  Temp: 97.8 F (36.6 C) 97.8 F (36.6 C)  Resp: 18 18     General: NAD, looks comfortable  Cardiovascular: RRR, S1, S2   Respiratory: Mild wheeze bilaterally  Abdomen: soft, non tender, no distention , bowel sounds normal  Musculoskeletal: No edema b/l  Discharge Instructions   Discharge Instructions    Diet - low sodium heart healthy    Complete by:  As directed      Increase activity slowly    Complete by:  As directed           Current Discharge Medication List    START taking these medications   Details  guaiFENesin (MUCINEX) 600 MG 12 hr tablet Take 1 tablet (600 mg total) by mouth 2 (two) times daily. Qty: 30 tablet, Refills: 0    levofloxacin (LEVAQUIN) 750 MG tablet Take 1 tablet (750 mg total) by mouth daily. Qty: 4 tablet, Refills: 0    nicotine (NICODERM CQ - DOSED IN MG/24 HR) 7 mg/24hr patch Place 1 patch (7 mg total) onto the skin daily. Qty: 28 patch, Refills: 0    predniSONE (DELTASONE) 10 MG tablet Take '40mg'$  po daily for 2 days then '30mg'$  daily for 2  days then '20mg'$  daily for 2 days then '10mg'$  daily for 2 days then stop Qty: 20 tablet, Refills: 0      CONTINUE these medications which have NOT CHANGED   Details  albuterol (PROVENTIL HFA;VENTOLIN HFA) 108 (90 BASE) MCG/ACT inhaler Inhale 2 puffs into the lungs every 6 (six) hours as needed for wheezing.    albuterol (PROVENTIL) (2.5 MG/3ML) 0.083% nebulizer solution Take 2.5 mg by nebulization every 4 (four) hours as needed for wheezing or shortness of breath.     hydrochlorothiazide (MICROZIDE) 12.5 MG capsule Take  1 capsule by mouth daily. Refills: 5    LORazepam (ATIVAN) 1 MG tablet Take 1 mg by mouth every 8 (eight) hours as needed.     naproxen sodium (ANAPROX) 220 MG tablet Take 440 mg by mouth daily as needed (for pain).    omeprazole (PRILOSEC) 40 MG capsule Take 40 mg by mouth daily.    Oxycodone HCl 10 MG TABS Take 10 mg by mouth every 4 (four) hours as needed (pain).     prochlorperazine (COMPAZINE) 10 MG tablet Take 10 mg by mouth every 6 (six) hours as needed for nausea or vomiting.     senna-docusate (SENOKOT-S) 8.6-50 MG per tablet Take 2 tablets by mouth at bedtime as needed for mild constipation.     SYMBICORT 80-4.5 MCG/ACT inhaler Inhale 2 puffs into the lungs 2 (two) times daily. Refills: 11    tiotropium (SPIRIVA) 18 MCG inhalation capsule Place 18 mcg into inhaler and inhale daily.       No Known Allergies    The results of significant diagnostics from this hospitalization (including imaging, microbiology, ancillary and laboratory) are listed below for reference.    Significant Diagnostic Studies: Dg Chest 2 View  12/09/2015  CLINICAL DATA:  Shortness of breath. Bilateral lung cancer. Tobacco use. EXAM: CHEST  2 VIEW COMPARISON:  07/15/2015 FINDINGS: Left basilar opacity measures 1.5 by 4.4 cm on the frontal projection and is probably in the lingula based on the lateral view. Other prior opacities along the aortic arch and left lung base have intervally resolved. Currently the right lung appears clear. Airway thickening is present, suggesting bronchitis or reactive airways disease. Old healed left lateral ninth rib fracture. IMPRESSION: 1. Nonspecific 1.5 by 4.4 cm left basilar opacity, similar to 07/15/2015 in morphology and appearance ; the other left-sided opacities have resolved from that exam. Given the provided history of lung cancer, it may be prudent to follow this with either radiography or chest CT in order to ensure that it represents scarring and not residual  cancer, correlate with the patient's clinical situation. 2. Airway thickening is present, suggesting bronchitis or reactive airways disease. Electronically Signed   By: Van Clines M.D.   On: 12/09/2015 14:29     Labs: Basic Metabolic Panel:  Recent Labs Lab 12/09/15 1350 12/11/15 0616  NA 139 139  K 3.8 4.8  CL 100* 96*  CO2 32 37*  GLUCOSE 132* 150*  BUN 6 12  CREATININE 0.94 0.96  CALCIUM 8.8* 9.3   Liver Function Tests:  Recent Labs Lab 12/09/15 1350  AST 22  ALT 23  ALKPHOS 60  BILITOT 0.5  PROT 7.6  ALBUMIN 4.0    CBC:  Recent Labs Lab 12/09/15 1350 12/11/15 0616  WBC 6.3 6.8  NEUTROABS 3.9  --   HGB 17.4* 16.5  HCT 51.7 52.2*  MCV 92.8 96.1  PLT 190 193   Cardiac Enzymes:  Recent Labs Lab 12/09/15 1947  TROPONINI <0.03     CBG:  Recent Labs Lab 12/10/15 1634 12/11/15 0114 12/11/15 1843 12/12/15 0032 12/12/15 0826  GLUCAP 148* 179* 177* 170* 150*      Signed: Rayelle Armor. MD [facility] 12/12/2015, 11:32 AM    By signing my name below, I, Rosalie Doctor, attest that this documentation has been prepared under the direction and in the presence of [facility]. MD Electronically Signed: Rosalie Doctor, Scribe. 12/12/2015  I, Dr. Kathie Dike, personally performed the services described in this documentaiton. All medical record entries made by the scribe were at my direction and in my presence. I have reviewed the chart and agree that the record reflects my personal performance and is accurate and complete  Kathie Dike, MD, 12/12/2015 11:32 AM

## 2015-12-12 NOTE — Progress Notes (Signed)
Patient's IV removed.  Site WNL.  AVS reviewed with patient.  Verbalized understanding of discharge instructions, physician follow-up(to call PCP in [place] for follow-up in 1-2 weeks and keep pulmonologist appt on 2/26), and medications.  Reports belongings intact and in possession at time of discharge.  Patient had home O2 tank for discharge.  Verbalizes to wear 2L [place] continuous.  Patient refused wheelchair.   Ambulated to entrance for discharge accompanied by nurse.  Patient stable at time of discharge.

## 2017-01-22 ENCOUNTER — Encounter (HOSPITAL_COMMUNITY): Payer: Self-pay | Admitting: Emergency Medicine

## 2017-01-22 ENCOUNTER — Emergency Department (HOSPITAL_COMMUNITY)
Admission: EM | Admit: 2017-01-22 | Discharge: 2017-01-22 | Disposition: A | Discharge status: Home / Self Care | Payer: Medicare HMO | Attending: Emergency Medicine | Admitting: Emergency Medicine

## 2017-01-22 DIAGNOSIS — F17210 Nicotine dependence, cigarettes, uncomplicated: Secondary | ICD-10-CM | POA: Diagnosis not present

## 2017-01-22 DIAGNOSIS — Z79899 Other long term (current) drug therapy: Secondary | ICD-10-CM | POA: Insufficient documentation

## 2017-01-22 DIAGNOSIS — J029 Acute pharyngitis, unspecified: Secondary | ICD-10-CM | POA: Insufficient documentation

## 2017-01-22 DIAGNOSIS — I10 Essential (primary) hypertension: Secondary | ICD-10-CM | POA: Insufficient documentation

## 2017-01-22 DIAGNOSIS — J449 Chronic obstructive pulmonary disease, unspecified: Secondary | ICD-10-CM | POA: Diagnosis not present

## 2017-01-22 DIAGNOSIS — R531 Weakness: Secondary | ICD-10-CM | POA: Diagnosis present

## 2017-01-22 LAB — COMPREHENSIVE METABOLIC PANEL
ALT: 13 U/L — AB (ref 17–63)
ANION GAP: 7 (ref 5–15)
AST: 13 U/L — ABNORMAL LOW (ref 15–41)
Albumin: 3.7 g/dL (ref 3.5–5.0)
Alkaline Phosphatase: 75 U/L (ref 38–126)
BILIRUBIN TOTAL: 0.8 mg/dL (ref 0.3–1.2)
BUN: 14 mg/dL (ref 6–20)
CALCIUM: 9.3 mg/dL (ref 8.9–10.3)
CO2: 31 mmol/L (ref 22–32)
CREATININE: 1.01 mg/dL (ref 0.61–1.24)
Chloride: 105 mmol/L (ref 101–111)
Glucose, Bld: 112 mg/dL — ABNORMAL HIGH (ref 65–99)
Potassium: 3.6 mmol/L (ref 3.5–5.1)
SODIUM: 143 mmol/L (ref 135–145)
TOTAL PROTEIN: 7.2 g/dL (ref 6.5–8.1)

## 2017-01-22 LAB — CBC
HCT: 48.7 % (ref 39.0–52.0)
HEMOGLOBIN: 16.4 g/dL (ref 13.0–17.0)
MCH: 31.2 pg (ref 26.0–34.0)
MCHC: 33.7 g/dL (ref 30.0–36.0)
MCV: 92.6 fL (ref 78.0–100.0)
Platelets: 202 10*3/uL (ref 150–400)
RBC: 5.26 MIL/uL (ref 4.22–5.81)
RDW: 13.2 % (ref 11.5–15.5)
WBC: 7.9 10*3/uL (ref 4.0–10.5)

## 2017-01-22 LAB — URINALYSIS, ROUTINE W REFLEX MICROSCOPIC
BILIRUBIN URINE: NEGATIVE
GLUCOSE, UA: NEGATIVE mg/dL
HGB URINE DIPSTICK: NEGATIVE
Ketones, ur: NEGATIVE mg/dL
Leukocytes, UA: NEGATIVE
Nitrite: NEGATIVE
Protein, ur: NEGATIVE mg/dL
SPECIFIC GRAVITY, URINE: 1.021 (ref 1.005–1.030)
pH: 7 (ref 5.0–8.0)

## 2017-01-22 NOTE — ED Provider Notes (Signed)
[facility] DEPT Provider Note   CSN: [id number] Arrival date & time: 01/22/17  1219     History   Chief Complaint Chief Complaint  Patient presents with  . Weakness    HPI Miguel Marsh is a 56 y.o. male.   Sore Throat  This is a new problem. The current episode started yesterday. The problem occurs constantly. The problem has not changed since onset.Pertinent negatives include no chest pain, no headaches and no shortness of breath. Nothing aggravates the symptoms. Nothing relieves the symptoms. He has tried nothing for the symptoms.   Some dysuria, temp today of 100.1 at home, CNA told him to come here.   Past Medical History:  Diagnosis Date  . Arthritis   . Bronchitis   . Cancer [facility])    metastatic NSCLC (followed by [facility])  . COPD (chronic obstructive pulmonary disease) ([place])   . HTN (hypertension)     Patient Active Problem List   Diagnosis Date Noted  . Essential hypertension 12/09/2015  . Non-small cell cancer of right lung ([place]) 12/09/2015  . GERD (gastroesophageal reflux disease) 12/09/2015  . COPD with acute exacerbation ([place]) 12/17/2014  . Acute on chronic respiratory failure ([place]) 12/17/2014  . Tobacco use disorder 12/17/2014  . COPD exacerbation ([place]) 12/17/2014  . Obesity 12/17/2014  . Dyspnea   . Difficulty in walking(719.7) 04/02/2013  . Hip weakness 04/02/2013    Past Surgical History:  Procedure Laterality Date  . LUNG BIOPSY    . TOTAL HIP ARTHROPLASTY         Home Medications    Prior to Admission medications   Medication Sig Start Date End Date Taking? Authorizing Provider  albuterol (PROVENTIL HFA;VENTOLIN HFA) 108 (90 BASE) MCG/ACT inhaler Inhale 2 puffs into the lungs every 6 (six) hours as needed for wheezing.    Historical Provider, MD  albuterol (PROVENTIL) (2.5 MG/3ML) 0.083% nebulizer solution Take 2.5 mg by nebulization every 4 (four) hours as needed for wheezing or shortness of breath.  11/22/15   Historical Provider, MD   guaiFENesin (MUCINEX) 600 MG 12 hr tablet Take 1 tablet (600 mg total) by mouth 2 (two) times daily. 12/12/15   Kathie Dike, MD  hydrochlorothiazide (MICROZIDE) 12.5 MG capsule Take 1 capsule by mouth daily. 12/02/14   Historical Provider, MD  levofloxacin (LEVAQUIN) 750 MG tablet Take 1 tablet (750 mg total) by mouth daily. 12/12/15   Kathie Dike, MD  LORazepam (ATIVAN) 1 MG tablet Take 1 mg by mouth every 8 (eight) hours as needed.  12/06/15   Historical Provider, MD  naproxen sodium (ANAPROX) 220 MG tablet Take 440 mg by mouth daily as needed (for pain).    Historical Provider, MD  nicotine (NICODERM CQ - DOSED IN MG/24 HR) 7 mg/24hr patch Place 1 patch (7 mg total) onto the skin daily. 12/12/15   Kathie Dike, MD  omeprazole (PRILOSEC) 40 MG capsule Take 40 mg by mouth daily. 04/06/15   Historical Provider, MD  Oxycodone HCl 10 MG TABS Take 10 mg by mouth every 4 (four) hours as needed (pain).  07/12/15   Historical Provider, MD  predniSONE (DELTASONE) 10 MG tablet Take '40mg'$  po daily for 2 days then '30mg'$  daily for 2 days then '20mg'$  daily for 2 days then '10mg'$  daily for 2 days then stop 12/12/15   Kathie Dike, MD  prochlorperazine (COMPAZINE) 10 MG tablet Take 10 mg by mouth every 6 (six) hours as needed for nausea or vomiting.  07/12/15   Historical Provider, MD  senna-docusate (SENOKOT-S) 8.6-50 MG per tablet Take 2 tablets by mouth at bedtime as needed for mild constipation.  06/15/15   Historical Provider, MD  SYMBICORT 80-4.5 MCG/ACT inhaler Inhale 2 puffs into the lungs 2 (two) times daily. 12/02/14   Historical Provider, MD  tiotropium (SPIRIVA) 18 MCG inhalation capsule Place 18 mcg into inhaler and inhale daily.    Historical Provider, MD    Family History History reviewed. No pertinent family history.  Social History Social History  Substance Use Topics  . Smoking status: Current Every Day Smoker    Packs/day: 0.10    Types: Cigarettes  . Smokeless tobacco: Never Used  . Alcohol use  Yes     Comment: occ     Allergies   Patient has no known allergies.   Review of Systems Review of Systems  Respiratory: Negative for shortness of breath.   Cardiovascular: Negative for chest pain.  Neurological: Negative for headaches.  All other systems reviewed and are negative.    Physical Exam Updated Vital Signs BP 131/65 (BP Location: Right Arm)   Pulse 91   Temp 98.3 F (36.8 C) (Oral)   Resp (!) 22   Ht '5\' 6"'$  (1.676 m)   Wt 245 lb (111.1 kg)   SpO2 95%   BMI 39.54 kg/m   Physical Exam  Constitutional: He is oriented to person, place, and time. He appears well-developed and well-nourished.  HENT:  Head: Normocephalic and atraumatic.  Eyes: Conjunctivae and EOM are normal.  Neck: Normal range of motion.  Posterior oropharyngeal erythema  Cardiovascular: Normal rate.   Pulmonary/Chest: Effort normal. No respiratory distress.  Abdominal: He exhibits no distension.  Musculoskeletal: Normal range of motion.  Lymphadenopathy:    He has no cervical adenopathy.  Neurological: He is alert and oriented to person, place, and time. No cranial nerve deficit. Coordination normal.  Nursing note and vitals reviewed.    ED Treatments / Results  Labs (all labs ordered are listed, but only abnormal results are displayed) Labs Reviewed  URINALYSIS, ROUTINE W REFLEX MICROSCOPIC - Abnormal; Notable for the following:       Result Value   APPearance CLOUDY (*)    All other components within normal limits  COMPREHENSIVE METABOLIC PANEL - Abnormal; Notable for the following:    Glucose, Bld 112 (*)    AST 13 (*)    ALT 13 (*)    All other components within normal limits  URINE CULTURE  CBC    EKG  EKG Interpretation None       Radiology No results found.  Procedures Procedures (including critical care time)  Medications Ordered in ED Medications - No data to display   Initial Impression / Assessment and Plan / ED Course  I have reviewed the triage  vital signs and the nursing notes.  Pertinent labs & imaging results that were available during my care of the patient were reviewed by me and considered in my medical decision making (see chart for details).     Likely pharyngitis. alread;y feeling better. No fever here. Urine clear. Patient wants to leave since urine is clear.    Final Clinical Impressions(s) / ED Diagnoses   Final diagnoses:  Pharyngitis, unspecified etiology      Merrily Pew, MD 01/22/17 [phone]

## 2017-01-22 NOTE — ED Notes (Signed)
Pt sitting in chair at this time, pt states that he is going to leave in 30 minutes if a doctor doesn't come in to see him. Dr. Dayna Barker informed.

## 2017-01-22 NOTE — ED Triage Notes (Signed)
PT c/o generalized weakness x1 week. PT also states increased urinary frequency and buring x3 days. PT states his homehealth CNA took his temp at home and it was reading 100.1 and they told him to come to the ED for eval.

## 2017-01-22 NOTE — ED Notes (Signed)
Discharge papers given by Dr. Dayna Barker and pt was seen ambulating out of ED.

## 2017-01-24 LAB — URINE CULTURE

## 2017-09-26 ENCOUNTER — Other Ambulatory Visit (HOSPITAL_COMMUNITY)
Admission: RE | Admit: 2017-09-26 | Discharge: 2017-09-26 | Disposition: A | Discharge status: Home / Self Care | Payer: Medicare HMO | Source: Ambulatory Visit | Attending: *Deleted | Admitting: *Deleted

## 2017-09-26 DIAGNOSIS — R69 Illness, unspecified: Secondary | ICD-10-CM | POA: Insufficient documentation

## 2017-09-26 LAB — CBC WITH DIFFERENTIAL/PLATELET
Basophils Absolute: 0 10*3/uL (ref 0.0–0.1)
Basophils Relative: 0 %
EOS ABS: 0.1 10*3/uL (ref 0.0–0.7)
EOS PCT: 1 %
HCT: 56.2 % — ABNORMAL HIGH (ref 39.0–52.0)
HEMOGLOBIN: 17.5 g/dL — AB (ref 13.0–17.0)
LYMPHS PCT: 27 %
Lymphs Abs: 2.9 10*3/uL (ref 0.7–4.0)
MCH: 30.2 pg (ref 26.0–34.0)
MCHC: 31.1 g/dL (ref 30.0–36.0)
MCV: 96.9 fL (ref 78.0–100.0)
MONOS PCT: 9 %
Monocytes Absolute: 1 10*3/uL (ref 0.1–1.0)
NEUTROS PCT: 62 %
Neutro Abs: 6.7 10*3/uL (ref 1.7–7.7)
Platelets: 209 10*3/uL (ref 150–400)
RBC: 5.8 MIL/uL (ref 4.22–5.81)
RDW: 14 % (ref 11.5–15.5)
WBC: 10.7 10*3/uL — AB (ref 4.0–10.5)

## 2017-09-26 LAB — COMPREHENSIVE METABOLIC PANEL
ALBUMIN: 4.2 g/dL (ref 3.5–5.0)
ALK PHOS: 87 U/L (ref 38–126)
ALT: 21 U/L (ref 17–63)
ANION GAP: 6 (ref 5–15)
AST: 14 U/L — ABNORMAL LOW (ref 15–41)
BILIRUBIN TOTAL: 0.5 mg/dL (ref 0.3–1.2)
BUN: 17 mg/dL (ref 6–20)
CO2: 34 mmol/L — ABNORMAL HIGH (ref 22–32)
Calcium: 9.4 mg/dL (ref 8.9–10.3)
Chloride: 98 mmol/L — ABNORMAL LOW (ref 101–111)
Creatinine, Ser: 1.24 mg/dL (ref 0.61–1.24)
GLUCOSE: 112 mg/dL — AB (ref 65–99)
Potassium: 4.5 mmol/L (ref 3.5–5.1)
Sodium: 138 mmol/L (ref 135–145)
Total Protein: 7.8 g/dL (ref 6.5–8.1)

## 2017-09-26 LAB — LIPID PANEL
CHOL/HDL RATIO: 3.3 ratio
Cholesterol: 160 mg/dL (ref 0–200)
HDL: 49 mg/dL (ref >40)
LDL CALC: 95 mg/dL (ref 0–99)
Triglycerides: 81 mg/dL (ref <150)
VLDL: 16 mg/dL (ref 0–40)

## 2017-09-26 LAB — PSA: PROSTATIC SPECIFIC ANTIGEN: 0.67 ng/mL (ref 0.00–4.00)

## 2017-12-04 HISTORY — PX: TUMOR REMOVAL: SHX12

## 2017-12-31 ENCOUNTER — Encounter (HOSPITAL_COMMUNITY): Payer: Self-pay | Admitting: Emergency Medicine

## 2017-12-31 ENCOUNTER — Other Ambulatory Visit: Payer: Self-pay

## 2017-12-31 ENCOUNTER — Inpatient Hospital Stay (HOSPITAL_COMMUNITY)
Admission: EM | Admit: 2017-12-31 | Discharge: 2018-01-02 | DRG: 190 | Disposition: A | Discharge status: Home / Self Care | Payer: Medicare Other | Attending: Internal Medicine | Admitting: Internal Medicine

## 2017-12-31 ENCOUNTER — Emergency Department (HOSPITAL_COMMUNITY): Payer: Medicare Other

## 2017-12-31 DIAGNOSIS — R739 Hyperglycemia, unspecified: Secondary | ICD-10-CM | POA: Diagnosis present

## 2017-12-31 DIAGNOSIS — Z85118 Personal history of other malignant neoplasm of bronchus and lung: Secondary | ICD-10-CM

## 2017-12-31 DIAGNOSIS — Z9981 Dependence on supplemental oxygen: Secondary | ICD-10-CM

## 2017-12-31 DIAGNOSIS — Z96649 Presence of unspecified artificial hip joint: Secondary | ICD-10-CM | POA: Diagnosis present

## 2017-12-31 DIAGNOSIS — Z79891 Long term (current) use of opiate analgesic: Secondary | ICD-10-CM | POA: Diagnosis not present

## 2017-12-31 DIAGNOSIS — Z791 Long term (current) use of non-steroidal anti-inflammatories (NSAID): Secondary | ICD-10-CM | POA: Diagnosis not present

## 2017-12-31 DIAGNOSIS — J441 Chronic obstructive pulmonary disease with (acute) exacerbation: Principal | ICD-10-CM | POA: Diagnosis present

## 2017-12-31 DIAGNOSIS — K219 Gastro-esophageal reflux disease without esophagitis: Secondary | ICD-10-CM

## 2017-12-31 DIAGNOSIS — E66813 Obesity, class 3: Secondary | ICD-10-CM

## 2017-12-31 DIAGNOSIS — J9621 Acute and chronic respiratory failure with hypoxia: Secondary | ICD-10-CM | POA: Diagnosis present

## 2017-12-31 DIAGNOSIS — Z79899 Other long term (current) drug therapy: Secondary | ICD-10-CM

## 2017-12-31 DIAGNOSIS — Z7951 Long term (current) use of inhaled steroids: Secondary | ICD-10-CM

## 2017-12-31 DIAGNOSIS — Z6841 Body Mass Index (BMI) 40.0 and over, adult: Secondary | ICD-10-CM | POA: Diagnosis not present

## 2017-12-31 DIAGNOSIS — G893 Neoplasm related pain (acute) (chronic): Secondary | ICD-10-CM | POA: Diagnosis present

## 2017-12-31 DIAGNOSIS — F17200 Nicotine dependence, unspecified, uncomplicated: Secondary | ICD-10-CM | POA: Diagnosis present

## 2017-12-31 DIAGNOSIS — G8929 Other chronic pain: Secondary | ICD-10-CM | POA: Diagnosis present

## 2017-12-31 DIAGNOSIS — I10 Essential (primary) hypertension: Secondary | ICD-10-CM | POA: Diagnosis present

## 2017-12-31 DIAGNOSIS — J90 Pleural effusion, not elsewhere classified: Secondary | ICD-10-CM | POA: Diagnosis present

## 2017-12-31 DIAGNOSIS — C3491 Malignant neoplasm of unspecified part of right bronchus or lung: Secondary | ICD-10-CM | POA: Diagnosis present

## 2017-12-31 DIAGNOSIS — F17210 Nicotine dependence, cigarettes, uncomplicated: Secondary | ICD-10-CM | POA: Diagnosis present

## 2017-12-31 DIAGNOSIS — J449 Chronic obstructive pulmonary disease, unspecified: Secondary | ICD-10-CM | POA: Diagnosis present

## 2017-12-31 LAB — BASIC METABOLIC PANEL
ANION GAP: 8 (ref 5–15)
BUN: 7 mg/dL (ref 6–20)
CALCIUM: 9.3 mg/dL (ref 8.9–10.3)
CO2: 29 mmol/L (ref 22–32)
CREATININE: 0.76 mg/dL (ref 0.61–1.24)
Chloride: 102 mmol/L (ref 101–111)
Glucose, Bld: 97 mg/dL (ref 65–99)
Potassium: 4.1 mmol/L (ref 3.5–5.1)
SODIUM: 139 mmol/L (ref 135–145)

## 2017-12-31 LAB — CBC WITH DIFFERENTIAL/PLATELET
BASOS ABS: 0 10*3/uL (ref 0.0–0.1)
Basophils Relative: 0 %
EOS PCT: 1 %
Eosinophils Absolute: 0.1 10*3/uL (ref 0.0–0.7)
HCT: 50.1 % (ref 39.0–52.0)
HEMOGLOBIN: 15.7 g/dL (ref 13.0–17.0)
LYMPHS PCT: 29 %
Lymphs Abs: 2.4 10*3/uL (ref 0.7–4.0)
MCH: 29.6 pg (ref 26.0–34.0)
MCHC: 31.3 g/dL (ref 30.0–36.0)
MCV: 94.4 fL (ref 78.0–100.0)
Monocytes Absolute: 1 10*3/uL (ref 0.1–1.0)
Monocytes Relative: 12 %
NEUTROS PCT: 58 %
Neutro Abs: 4.7 10*3/uL (ref 1.7–7.7)
PLATELETS: 229 10*3/uL (ref 150–400)
RBC: 5.31 MIL/uL (ref 4.22–5.81)
RDW: 13.7 % (ref 11.5–15.5)
WBC: 8.2 10*3/uL (ref 4.0–10.5)

## 2017-12-31 LAB — BRAIN NATRIURETIC PEPTIDE: B Natriuretic Peptide: 33 pg/mL (ref 0.0–100.0)

## 2017-12-31 LAB — I-STAT TROPONIN, ED: Troponin i, poc: 0.01 ng/mL (ref 0.00–0.08)

## 2017-12-31 LAB — GLUCOSE, CAPILLARY
GLUCOSE-CAPILLARY: 159 mg/dL — AB (ref 65–99)
Glucose-Capillary: 192 mg/dL — ABNORMAL HIGH (ref 65–99)

## 2017-12-31 MED ORDER — IPRATROPIUM-ALBUTEROL 0.5-2.5 (3) MG/3ML IN SOLN
3.0000 mL | Freq: Four times a day (QID) | RESPIRATORY_TRACT | Status: DC
  Administered 2017-12-31 – 2018-01-01 (×5): 3 mL via RESPIRATORY_TRACT
  Filled 2017-12-31 (×5): qty 3

## 2017-12-31 MED ORDER — SENNOSIDES-DOCUSATE SODIUM 8.6-50 MG PO TABS
1.0000 | ORAL_TABLET | Freq: Every evening | ORAL | Status: DC | PRN
  Filled 2017-12-31: qty 1

## 2017-12-31 MED ORDER — LORATADINE 10 MG PO TABS: 10.0000 mg | ORAL_TABLET | Freq: Every day | ORAL | Status: DC | PRN

## 2017-12-31 MED ORDER — MUSCLE RUB 10-15 % EX CREA
1.0000 "application " | TOPICAL_CREAM | CUTANEOUS | Status: DC | PRN
  Filled 2017-12-31: qty 85

## 2017-12-31 MED ORDER — ALUM & MAG HYDROXIDE-SIMETH 200-200-20 MG/5ML PO SUSP: 30.0000 mL | ORAL | Status: DC | PRN

## 2017-12-31 MED ORDER — METHYLPREDNISOLONE SODIUM SUCC 40 MG IJ SOLR
40.0000 mg | Freq: Three times a day (TID) | INTRAMUSCULAR | Status: DC
  Administered 2017-12-31 – 2018-01-02 (×6): 40 mg via INTRAVENOUS
  Filled 2017-12-31 (×7): qty 1

## 2017-12-31 MED ORDER — HYDROCORTISONE 2.5 % RE CREA
1.0000 "application " | TOPICAL_CREAM | Freq: Four times a day (QID) | RECTAL | Status: DC | PRN
  Filled 2017-12-31: qty 28.35

## 2017-12-31 MED ORDER — ACETAMINOPHEN 650 MG RE SUPP: 650.0000 mg | Freq: Four times a day (QID) | RECTAL | Status: DC | PRN

## 2017-12-31 MED ORDER — IPRATROPIUM-ALBUTEROL 0.5-2.5 (3) MG/3ML IN SOLN: 3.0000 mL | RESPIRATORY_TRACT | Status: DC | PRN

## 2017-12-31 MED ORDER — GUAIFENESIN-DM 100-10 MG/5ML PO SYRP
5.0000 mL | ORAL_SOLUTION | ORAL | Status: DC | PRN
  Administered 2018-01-01 (×2): 5 mL via ORAL
  Filled 2017-12-31 (×2): qty 5

## 2017-12-31 MED ORDER — ONDANSETRON HCL 4 MG PO TABS: 4.0000 mg | ORAL_TABLET | Freq: Four times a day (QID) | ORAL | Status: DC | PRN

## 2017-12-31 MED ORDER — HYDROCORTISONE 1 % EX CREA: 1.0000 "application " | TOPICAL_CREAM | Freq: Three times a day (TID) | CUTANEOUS | Status: DC | PRN

## 2017-12-31 MED ORDER — ONDANSETRON HCL 4 MG/2ML IJ SOLN
4.0000 mg | Freq: Four times a day (QID) | INTRAMUSCULAR | Status: DC | PRN
Start: 2017-12-31 — End: 2018-01-02

## 2017-12-31 MED ORDER — INSULIN ASPART 100 UNIT/ML ~~LOC~~ SOLN
0.0000 [IU] | Freq: Three times a day (TID) | SUBCUTANEOUS | Status: DC
  Administered 2017-12-31: 2 [IU] via SUBCUTANEOUS
  Administered 2018-01-01: 1 [IU] via SUBCUTANEOUS
  Administered 2018-01-01: 2 [IU] via SUBCUTANEOUS
  Administered 2018-01-02 (×2): 1 [IU] via SUBCUTANEOUS

## 2017-12-31 MED ORDER — POLYVINYL ALCOHOL 1.4 % OP SOLN: 1.0000 [drp] | OPHTHALMIC | Status: DC | PRN

## 2017-12-31 MED ORDER — IPRATROPIUM-ALBUTEROL 0.5-2.5 (3) MG/3ML IN SOLN
3.0000 mL | Freq: Once | RESPIRATORY_TRACT | Status: AC
  Administered 2017-12-31: 3 mL via RESPIRATORY_TRACT
  Filled 2017-12-31: qty 3

## 2017-12-31 MED ORDER — PHENOL 1.4 % MT LIQD: 1.0000 | OROMUCOSAL | Status: DC | PRN

## 2017-12-31 MED ORDER — ENOXAPARIN SODIUM 60 MG/0.6ML ~~LOC~~ SOLN
60.0000 mg | SUBCUTANEOUS | Status: DC
  Administered 2017-12-31 – 2018-01-01 (×2): 60 mg via SUBCUTANEOUS
  Filled 2017-12-31 (×2): qty 0.6

## 2017-12-31 MED ORDER — METHYLPREDNISOLONE SODIUM SUCC 125 MG IJ SOLR
125.0000 mg | Freq: Once | INTRAMUSCULAR | Status: AC
  Administered 2017-12-31: 125 mg via INTRAVENOUS
  Filled 2017-12-31: qty 2

## 2017-12-31 MED ORDER — LIP MEDEX EX OINT
1.0000 "application " | TOPICAL_OINTMENT | CUTANEOUS | Status: DC | PRN
  Filled 2017-12-31: qty 7

## 2017-12-31 MED ORDER — SALINE SPRAY 0.65 % NA SOLN: 1.0000 | NASAL | Status: DC | PRN

## 2017-12-31 MED ORDER — ACETAMINOPHEN 325 MG PO TABS: 650.0000 mg | ORAL_TABLET | Freq: Four times a day (QID) | ORAL | Status: DC | PRN

## 2017-12-31 MED ORDER — HYDROCODONE-ACETAMINOPHEN 5-325 MG PO TABS
1.0000 | ORAL_TABLET | ORAL | Status: DC | PRN
  Administered 2018-01-01: 2 via ORAL
  Administered 2018-01-01 (×2): 1 via ORAL
  Administered 2018-01-02: 2 via ORAL
  Filled 2017-12-31 (×2): qty 1
  Filled 2017-12-31 (×2): qty 2

## 2017-12-31 MED ORDER — AZITHROMYCIN 250 MG PO TABS
500.0000 mg | ORAL_TABLET | Freq: Every day | ORAL | Status: DC
  Administered 2017-12-31 – 2018-01-02 (×3): 500 mg via ORAL
  Filled 2017-12-31 (×3): qty 2

## 2017-12-31 MED ORDER — ALBUTEROL SULFATE (2.5 MG/3ML) 0.083% IN NEBU
5.0000 mg | INHALATION_SOLUTION | Freq: Once | RESPIRATORY_TRACT | Status: AC
  Administered 2017-12-31: 5 mg via RESPIRATORY_TRACT
  Filled 2017-12-31: qty 6

## 2017-12-31 NOTE — ED Notes (Signed)
ED Provider at bedside. 

## 2017-12-31 NOTE — H&P (Signed)
History and Physical    Miguel Marsh:[id number] DOB: 02-01-1961 DOA: 12/31/2017  PCP: [name], DO Patient coming from: Home  Chief Complaint: Shortness of breath  HPI: Miguel Marsh is a 57 y.o. male with medical history significant of essential hypertension, metastatic non-small cell lung cancer in remission, COPD on 2 L nasal cannula at night, GERD came to the hospital with complains of shortness of breath.  Patient states for the past week he has had some mild productive cough bringing up whitish sputum with exertion or shortness of breath.  The symptoms worsened last night and he ended up calling his caregiver this morning who advised him to come to the ER for further care.  Patient tried using home nebulizer treatment without any relief.  Denies any fevers, chills, chest pain and any other complaints.  In the ER patient was noted to be hypoxic and 80% especially with ambulation on 2 L nasal cannula.  He was noted to have coarse breath sounds and found to be in COPD exacerbation.  He was started on Solu-Medrol and nebulizer treatments.  Due to hypoxia with minimal exertion it was determined to admit him for further care.   Review of Systems: As per HPI otherwise 10 point review of systems negative.   Review of Systems  Constitutional: Negative.  Negative for chills and fever.  HENT: Negative for ear discharge, ear pain, hearing loss and tinnitus.   Eyes: Negative for blurred vision and double vision.  Respiratory: Positive for cough, sputum production and shortness of breath. Negative for wheezing.   Cardiovascular: Negative for chest pain and palpitations.  Gastrointestinal: Negative for heartburn and nausea.  Genitourinary: Negative for frequency and urgency.  Musculoskeletal: Negative for myalgias and neck pain.  Skin: Negative for itching and rash.  Neurological: Negative for sensory change, speech change, focal weakness, seizures and loss of consciousness.    Endo/Heme/Allergies: Negative for environmental allergies. Does not bruise/bleed easily.  Psychiatric/Behavioral: Negative for depression and suicidal ideas.    Past Medical History:  Diagnosis Date  . Arthritis   . Bronchitis   . Cancer [facility])    metastatic NSCLC (followed by [facility])  . COPD (chronic obstructive pulmonary disease) ([place])   . HTN (hypertension)     Past Surgical History:  Procedure Laterality Date  . LUNG BIOPSY    . TOTAL HIP ARTHROPLASTY       reports that he has been smoking cigarettes.  He has been smoking about 0.10 packs per day. he has never used smokeless tobacco. He reports that he drinks alcohol. He reports that he does not use drugs.  No Known Allergies  Family history is negative for diabetes and hypertension from maternal and father side.  Prior to Admission medications   Medication Sig Start Date End Date Taking? Authorizing Provider  albuterol (PROVENTIL HFA;VENTOLIN HFA) 108 (90 BASE) MCG/ACT inhaler Inhale 2 puffs into the lungs every 6 (six) hours as needed for wheezing.   Yes [provider]  albuterol (PROVENTIL) (2.5 MG/3ML) 0.083% nebulizer solution Take 2.5 mg by nebulization every 4 (four) hours as needed for wheezing or shortness of breath.  11/22/15  Yes [provider]  guaiFENesin (MUCINEX) 600 MG 12 hr tablet Take 1 tablet (600 mg total) by mouth 2 (two) times daily. 12/12/15  Yes Kathie Dike, MD  hydrochlorothiazide (MICROZIDE) 12.5 MG capsule Take 1 capsule by mouth daily. 12/02/14  Yes [provider]  naproxen sodium (ANAPROX) 220 MG tablet Take 440 mg by  mouth daily as needed (for pain).   Yes [provider]  nicotine polacrilex (NICORETTE) 2 MG gum Take 2 mg by mouth as needed for smoking cessation.   Yes [provider]  omeprazole (PRILOSEC) 40 MG capsule Take 40 mg by mouth daily. 04/06/15  Yes [provider]  Oxycodone HCl 10 MG TABS Take 10 mg by mouth every 4 (four) hours as  needed (pain).  07/12/15  Yes [provider]  senna-docusate (SENOKOT-S) 8.6-50 MG per tablet Take 2 tablets by mouth at bedtime as needed for mild constipation.  06/15/15  Yes [provider]  SYMBICORT 80-4.5 MCG/ACT inhaler Inhale 2 puffs into the lungs 2 (two) times daily. 12/02/14  Yes [provider]  tiotropium (SPIRIVA) 18 MCG inhalation capsule Place 18 mcg into inhaler and inhale daily.   Yes [provider]    Physical Exam: Vitals:   12/31/17 1345 12/31/17 1400 12/31/17 1405 12/31/17 1500  BP:  (!) 148/89  98/84  Pulse: 84 78  81  Resp: (!) 21 20  (!) 28  Temp:      TempSrc:      SpO2: (!) 89% 92% 98% 96%  Weight:      Height:          Constitutional: NAD, calm, comfortable, 2 L nasal cannula in place Vitals:   12/31/17 1345 12/31/17 1400 12/31/17 1405 12/31/17 1500  BP:  (!) 148/89  98/84  Pulse: 84 78  81  Resp: (!) 21 20  (!) 28  Temp:      TempSrc:      SpO2: (!) 89% 92% 98% 96%  Weight:      Height:       Eyes: PERRL, lids and conjunctivae normal ENMT: Mucous membranes are moist. Posterior pharynx clear of any exudate or lesions.Normal dentition.  Neck: normal, supple, no masses, no thyromegaly Respiratory: Mild diffuse coarse breath sounds, some crackles heard at the right lower lung base Cardiovascular: Regular rate and rhythm, no murmurs / rubs / gallops. No extremity edema. 2+ pedal pulses. No carotid bruits.  Abdomen: no tenderness, no masses palpated. No hepatosplenomegaly. Bowel sounds positive.  Musculoskeletal: no clubbing / cyanosis. No joint deformity upper and lower extremities. Good ROM, no contractures. Normal muscle tone.  Skin: no rashes, lesions, ulcers. No induration Neurologic: CN 2-12 grossly intact. Sensation intact, DTR normal. Strength 5/5 in all 4.  Psychiatric: Normal judgment and insight. Alert and oriented x 3. Normal mood.     Labs on Admission: I have personally reviewed following labs and  imaging studies  CBC: Recent Labs  Lab 12/31/17 1228  WBC 8.2  NEUTROABS 4.7  HGB 15.7  HCT 50.1  MCV 94.4  PLT 170   Basic Metabolic Panel: Recent Labs  Lab 12/31/17 1228  NA 139  K 4.1  CL 102  CO2 29  GLUCOSE 97  BUN 7  CREATININE 0.76  CALCIUM 9.3   GFR: Estimated Creatinine Clearance: 124.1 mL/min (by C-G formula based on SCr of 0.76 mg/dL). Liver Function Tests: No results for input(s): AST, ALT, ALKPHOS, BILITOT, PROT, ALBUMIN in the last 168 hours. No results for input(s): LIPASE, AMYLASE in the last 168 hours. No results for input(s): AMMONIA in the last 168 hours. Coagulation Profile: No results for input(s): INR, PROTIME in the last 168 hours. Cardiac Enzymes: No results for input(s): CKTOTAL, CKMB, CKMBINDEX, TROPONINI in the last 168 hours. BNP (last 3 results) No results for input(s): PROBNP in the last 8760  hours. HbA1C: No results for input(s): HGBA1C in the last 72 hours. CBG: No results for input(s): GLUCAP in the last 168 hours. Lipid Profile: No results for input(s): CHOL, HDL, LDLCALC, TRIG, CHOLHDL, LDLDIRECT in the last 72 hours. Thyroid Function Tests: No results for input(s): TSH, T4TOTAL, FREET4, T3FREE, THYROIDAB in the last 72 hours. Anemia Panel: No results for input(s): VITAMINB12, FOLATE, FERRITIN, TIBC, IRON, RETICCTPCT in the last 72 hours. Urine analysis:    Component Value Date/Time   COLORURINE YELLOW 01/22/2017 1524   APPEARANCEUR CLOUDY (A) 01/22/2017 1524   LABSPEC 1.021 01/22/2017 1524   PHURINE 7.0 01/22/2017 1524   GLUCOSEU NEGATIVE 01/22/2017 1524   HGBUR NEGATIVE 01/22/2017 1524   BILIRUBINUR NEGATIVE 01/22/2017 1524   KETONESUR NEGATIVE 01/22/2017 1524   PROTEINUR NEGATIVE 01/22/2017 1524   UROBILINOGEN 0.2 10/21/2013 0200   NITRITE NEGATIVE 01/22/2017 1524   LEUKOCYTESUR NEGATIVE 01/22/2017 1524   Sepsis Labs: !!!!!!!!!!!!!!!!!!!!!!!!!!!!!!!!!!!!!!!!!!!! @LABRCNTIP (procalcitonin:4,lacticidven:4) )No  results found for this or any previous visit (from the past 240 hour(s)).   Radiological Exams on Admission: Dg Chest 2 View  Result Date: 12/31/2017 CLINICAL DATA:  Worsening shortness of breath. 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 EXAM: CHEST  2 VIEW COMPARISON:  12/09/2015 FINDINGS: The cardiomediastinal silhouette is unchanged and within normal limits. There are chronic bronchitic changes. The left lung is better inflated than on the prior study with minimal scarring noted in the left lung base. Triangular left basilar opacity on the prior study is no longer readily apparent. There is a small right pleural effusion. No pneumothorax is identified. No acute osseous abnormality is seen. IMPRESSION: 1. New small right pleural effusion. 2. Chronic bronchitic changes. 3. Improved aeration of the left lung base. Electronically Signed   By: Logan Bores M.D.   On: 12/31/2017 12:46      Assessment/Plan Active Problems:   COPD exacerbation (HCC)     Acute Respiratory Distress with Hypoxia, 80% on 2L [place] Acute Exac of Mild to Moderate COPD -Nebulizers standing and as needed ordered, IV Solu-Medrol which we will eventually transition to oral prednisone - Accu-Cheks and sliding scale while on steroids -Chest x-ray shows= small right pleural effusion, chronic bronchitic changes -Antibiotics= oral azithromycin - Antitussives as needed -Supplemental oxygen as needed, check ambulatory pulse ox if needed - Continue to provide supportive care -Sliding scale and Accu-Chek while on Solu-Medrol -Continue home steroid inhaler  GERD -Continue Protonix  Essential hypertension -Hydrochlorothiazide 12.5 mg  Chronic pain - On home dose of oxycodone 10 mg every 4 hours as needed -PRN bowel regimen  History of metastatic non-small cell lung cancer - He is to follow-up [facility].  Currently in remission according to him   DVT prophylaxis: Lovenox Code Status:  Full Family Communication: None at bedside Disposition Plan:  TBD Consults called: NOne Admission status: Observation   Ankit Arsenio Loader MD [facility] Pager [phone]  If 7PM-7AM, please contact night-coverage [email or web address] Password [facility]  12/31/2017, 3:51 PM

## 2017-12-31 NOTE — ED Triage Notes (Signed)
Pt had a tumor removed from right lung In February. C/o of SOB starting yesterday with no relief from home breathing tx. Pt was sat mid 80's at home. Pt on 2L[place].

## 2017-12-31 NOTE — ED Notes (Signed)
Patient transported to X-ray 

## 2017-12-31 NOTE — ED Provider Notes (Signed)
[facility] EMERGENCY DEPARTMENT Provider Note   CSN: [id number] Arrival date & time: 12/31/17  1053     History   Chief Complaint Chief Complaint  Patient presents with  . Shortness of Breath    HPI Miguel Marsh is a 57 y.o. male.  He has a history of lung cancer and the beginning of February had a resection of a fatty tumor in his chest.  He is brought in with his home health aide for shortness of breath.  He states last night while off of oxygen walking to the bathroom he became more short of breath and it took 20 minutes for him to get his breathing back under control.  He uses oxygen at night.  His home health aide said he is always short of breath and he never uses his oxygen like he is supposed to.  He does not have a pulmonologist.  He has a history of COPD and its been a while since he has been on steroids.  He denies any fever or chest pain.  He is got some sputum but it is clear.  He has been eating well and has no other complaints.  If he continues to smoke about half a pack a day.  The history is provided by the patient and a caregiver.  Shortness of Breath  This is a chronic problem. The problem occurs frequently.The current episode started 12 to 24 hours ago. The problem has been gradually improving. Associated symptoms include cough, sputum production and wheezing. Pertinent negatives include no fever, no rhinorrhea, no sore throat, no ear pain, no neck pain, no hemoptysis, no chest pain, no vomiting, no abdominal pain and no rash. Risk factors include smoking. He has tried beta-agonist inhalers and inhaled steroids for the symptoms. He has had prior hospitalizations. He has had prior ED visits. Associated medical issues include COPD, chronic lung disease and recent surgery.    Past Medical History:  Diagnosis Date  . Arthritis   . Bronchitis   . Cancer [facility])    metastatic NSCLC (followed by [facility])  . COPD (chronic obstructive pulmonary disease) ([place])   . HTN  (hypertension)     Patient Active Problem List   Diagnosis Date Noted  . Essential hypertension 12/09/2015  . Non-small cell cancer of right lung ([place]) 12/09/2015  . GERD (gastroesophageal reflux disease) 12/09/2015  . COPD with acute exacerbation ([place]) 12/17/2014  . Acute on chronic respiratory failure ([place]) 12/17/2014  . Tobacco use disorder 12/17/2014  . COPD exacerbation ([place]) 12/17/2014  . Obesity 12/17/2014  . Dyspnea   . Difficulty in walking(719.7) 04/02/2013  . Hip weakness 04/02/2013    Past Surgical History:  Procedure Laterality Date  . LUNG BIOPSY    . TOTAL HIP ARTHROPLASTY         Home Medications    Prior to Admission medications   Medication Sig Start Date End Date Taking? Authorizing Provider  albuterol (PROVENTIL HFA;VENTOLIN HFA) 108 (90 BASE) MCG/ACT inhaler Inhale 2 puffs into the lungs every 6 (six) hours as needed for wheezing.    [provider]  albuterol (PROVENTIL) (2.5 MG/3ML) 0.083% nebulizer solution Take 2.5 mg by nebulization every 4 (four) hours as needed for wheezing or shortness of breath.  11/22/15   [provider]  guaiFENesin (MUCINEX) 600 MG 12 hr tablet Take 1 tablet (600 mg total) by mouth 2 (two) times daily. 12/12/15   Kathie Dike, MD  hydrochlorothiazide (MICROZIDE) 12.5 MG capsule Take 1 capsule by  mouth daily. 12/02/14   [provider]  levofloxacin (LEVAQUIN) 750 MG tablet Take 1 tablet (750 mg total) by mouth daily. 12/12/15   Kathie Dike, MD  LORazepam (ATIVAN) 1 MG tablet Take 1 mg by mouth every 8 (eight) hours as needed.  12/06/15   [provider]  naproxen sodium (ANAPROX) 220 MG tablet Take 440 mg by mouth daily as needed (for pain).    [provider]  nicotine (NICODERM CQ - DOSED IN MG/24 HR) 7 mg/24hr patch Place 1 patch (7 mg total) onto the skin daily. 12/12/15   Kathie Dike, MD  omeprazole (PRILOSEC) 40 MG capsule Take 40 mg by mouth daily. 04/06/15   [provider]  Oxycodone HCl 10 MG TABS Take 10 mg by mouth every 4 (four) hours as needed (pain).  07/12/15   [provider]  predniSONE (DELTASONE) 10 MG tablet Take 40mg  po daily for 2 days then 30mg  daily for 2 days then 20mg  daily for 2 days then 10mg  daily for 2 days then stop 12/12/15   Kathie Dike, MD  prochlorperazine (COMPAZINE) 10 MG tablet Take 10 mg by mouth every 6 (six) hours as needed for nausea or vomiting.  07/12/15   [provider]  senna-docusate (SENOKOT-S) 8.6-50 MG per tablet Take 2 tablets by mouth at bedtime as needed for mild constipation.  06/15/15   [provider]  SYMBICORT 80-4.5 MCG/ACT inhaler Inhale 2 puffs into the lungs 2 (two) times daily. 12/02/14   [provider]  tiotropium (SPIRIVA) 18 MCG inhalation capsule Place 18 mcg into inhaler and inhale daily.    [provider]    Family History History reviewed. No pertinent family history.  Social History Social History   Tobacco Use  . Smoking status: Current Every Day Smoker    Packs/day: 0.10    Types: Cigarettes  . Smokeless tobacco: Never Used  Substance Use Topics  . Alcohol use: Yes    Comment: occ  . Drug use: No    Comment: former     Allergies   Patient has no known allergies.   Review of Systems Review of Systems  Constitutional: Negative for chills and fever.  HENT: Negative for ear pain, rhinorrhea and sore throat.   Eyes: Negative for pain and visual disturbance.  Respiratory: Positive for cough, sputum production, shortness of breath and wheezing. Negative for hemoptysis.   Cardiovascular: Negative for chest pain and palpitations.  Gastrointestinal: Negative for abdominal pain and vomiting.  Genitourinary: Negative for dysuria and hematuria.  Musculoskeletal: Negative for arthralgias, back pain and neck pain.  Skin: Negative for color change and rash.  Neurological: Negative for seizures and syncope.  All other systems reviewed  and are negative.    Physical Exam Updated Vital Signs BP 135/87   Pulse 78   Temp 98.6 F (37 C) (Oral)   Resp (!) 22   Ht 5\' 6"  (1.676 m)   Wt 117 kg (258 lb)   SpO2 96%   BMI 41.64 kg/m   Physical Exam  Constitutional: He appears well-developed and well-nourished.  HENT:  Head: Normocephalic and atraumatic.  Eyes: Conjunctivae are normal.  Neck: Neck supple.  Cardiovascular: Normal rate and regular rhythm.  No murmur heard. Pulmonary/Chest: Effort normal. No accessory muscle usage or stridor. Tachypnea noted. No respiratory distress. He has wheezes in the right upper field, the right middle field, the right lower field, the left upper field, the left middle field and the left lower  field.  Abdominal: Soft. There is no tenderness.  Musculoskeletal: He exhibits no edema.       Right lower leg: Normal. He exhibits no tenderness and no edema.       Left lower leg: Normal. He exhibits no tenderness and no edema.  Neurological: He is alert.  Skin: Skin is warm and dry. Capillary refill takes less than 2 seconds.  Psychiatric: He has a normal mood and affect.  Nursing note and vitals reviewed.    ED Treatments / Results  Labs (all labs ordered are listed, but only abnormal results are displayed) Labs Reviewed  COMPREHENSIVE METABOLIC PANEL - Abnormal; Notable for the following components:      Result Value   Glucose, Bld 175 (*)    All other components within normal limits  CBC - Abnormal; Notable for the following components:   WBC 10.6 (*)    All other components within normal limits  GLUCOSE, CAPILLARY - Abnormal; Notable for the following components:   Glucose-Capillary 159 (*)    All other components within normal limits  GLUCOSE, CAPILLARY - Abnormal; Notable for the following components:   Glucose-Capillary 192 (*)    All other components within normal limits  GLUCOSE, CAPILLARY - Abnormal; Notable for the following components:   Glucose-Capillary 132 (*)     All other components within normal limits  GLUCOSE, CAPILLARY - Abnormal; Notable for the following components:   Glucose-Capillary 192 (*)    All other components within normal limits  GLUCOSE, CAPILLARY - Abnormal; Notable for the following components:   Glucose-Capillary 143 (*)    All other components within normal limits  GLUCOSE, CAPILLARY - Abnormal; Notable for the following components:   Glucose-Capillary 178 (*)    All other components within normal limits  GLUCOSE, CAPILLARY - Abnormal; Notable for the following components:   Glucose-Capillary 130 (*)    All other components within normal limits  GLUCOSE, CAPILLARY - Abnormal; Notable for the following components:   Glucose-Capillary 133 (*)    All other components within normal limits  BASIC METABOLIC PANEL  CBC WITH DIFFERENTIAL/PLATELET  BRAIN NATRIURETIC PEPTIDE  HIV ANTIBODY (ROUTINE TESTING)  TROPONIN I  TROPONIN I  I-STAT TROPONIN, ED    EKG  EKG Interpretation None       Radiology No results found.    CLINICAL DATA: Worsening shortness of breath. History of lung  cancer and posterior mediastinal nerve sheath tumor.    EXAM:  CHEST 2 VIEW    COMPARISON: 12/09/2015    FINDINGS:  The cardiomediastinal silhouette is unchanged and within normal  limits. There are chronic bronchitic changes. The left lung is  better inflated than on the prior study with minimal scarring noted  in the left lung base. Triangular left basilar opacity on the prior  study is no longer readily apparent. There is a small right pleural  effusion. No pneumothorax is identified. No acute osseous  abnormality is seen.    IMPRESSION:  1. New small right pleural effusion.  2. Chronic bronchitic changes.  3. Improved aeration of the left lung base.      Electronically Signed  By: Logan Bores M.D.  On: 12/31/2017 12:46        Procedures .EKG Performed by: Hayden Rasmussen, MD Authorized by: Hayden Rasmussen, MD   ECG reviewed by ED Physician in the absence of a cardiologist: yes   Previous ECG:    Previous ECG:  Compared to current   Similarity:  No change Interpretation:    Interpretation: normal   Rate:    ECG rate:  96 Rhythm:    Rhythm: sinus rhythm   Ectopy:    Ectopy: none   QRS:    QRS axis:  Normal Conduction:    Conduction: normal   ST segments:    ST segments:  Normal T waves:    T waves: normal     (including critical care time)  Medications Ordered in ED Medications  methylPREDNISolone sodium succinate (SOLU-MEDROL) 125 mg/2 mL injection 125 mg (not administered)  ipratropium-albuterol (DUONEB) 0.5-2.5 (3) MG/3ML nebulizer solution 3 mL (not administered)  albuterol (PROVENTIL) (2.5 MG/3ML) 0.083% nebulizer solution 5 mg (5 mg Nebulization Given 12/31/17 1127)     Initial Impression / Assessment and Plan / ED Course  I have reviewed the triage vital signs and the nursing notes.  Pertinent labs & imaging results that were available during my care of the patient were reviewed by me and considered in my medical decision making (see chart for details).  Clinical Course as of Jan 03 1732  Mon Dec 31, 2017  1312 Lying comfortably watching TV.  Had just gotten the breathing treatment.  Lab results are starting to come back chest x-ray read as no obvious infiltrate.  [MB]  3491 Patient ambulated with staff off of oxygen sats dropped to mid 80s with increased respiratory rate.  He was unable to go very far before he needed to go back to bed.  We discussed admission and the patient was agreeable stating he feels too short of breath to go home now.  [MB]  7915 Discussed with hospitalist on-call who will admit the patient to his service.  [MB]    Clinical Course User Index [MB] Hayden Rasmussen, MD     Final Clinical Impressions(s) / ED Diagnoses   Final diagnoses:  COPD exacerbation ([place])       Hayden Rasmussen, MD 01/02/18 1736

## 2017-12-31 NOTE — ED Notes (Signed)
Pt ambulated without oxygen. O2 sat was 84% on room air while walking.  Pt became SOB.  Pt placed on 3L [place] when pt was placed back in room

## 2018-01-01 DIAGNOSIS — K219 Gastro-esophageal reflux disease without esophagitis: Secondary | ICD-10-CM | POA: Diagnosis present

## 2018-01-01 DIAGNOSIS — G8929 Other chronic pain: Secondary | ICD-10-CM | POA: Diagnosis present

## 2018-01-01 DIAGNOSIS — F17200 Nicotine dependence, unspecified, uncomplicated: Secondary | ICD-10-CM

## 2018-01-01 DIAGNOSIS — E66813 Obesity, class 3: Secondary | ICD-10-CM

## 2018-01-01 DIAGNOSIS — J441 Chronic obstructive pulmonary disease with (acute) exacerbation: Secondary | ICD-10-CM | POA: Diagnosis present

## 2018-01-01 DIAGNOSIS — I10 Essential (primary) hypertension: Secondary | ICD-10-CM | POA: Diagnosis present

## 2018-01-01 DIAGNOSIS — Z79891 Long term (current) use of opiate analgesic: Secondary | ICD-10-CM | POA: Diagnosis not present

## 2018-01-01 DIAGNOSIS — Z6841 Body Mass Index (BMI) 40.0 and over, adult: Secondary | ICD-10-CM | POA: Diagnosis not present

## 2018-01-01 DIAGNOSIS — C3491 Malignant neoplasm of unspecified part of right bronchus or lung: Secondary | ICD-10-CM | POA: Diagnosis not present

## 2018-01-01 DIAGNOSIS — Z791 Long term (current) use of non-steroidal anti-inflammatories (NSAID): Secondary | ICD-10-CM | POA: Diagnosis not present

## 2018-01-01 DIAGNOSIS — J9621 Acute and chronic respiratory failure with hypoxia: Secondary | ICD-10-CM

## 2018-01-01 DIAGNOSIS — Z79899 Other long term (current) drug therapy: Secondary | ICD-10-CM | POA: Diagnosis not present

## 2018-01-01 DIAGNOSIS — J90 Pleural effusion, not elsewhere classified: Secondary | ICD-10-CM | POA: Diagnosis present

## 2018-01-01 DIAGNOSIS — F17210 Nicotine dependence, cigarettes, uncomplicated: Secondary | ICD-10-CM | POA: Diagnosis present

## 2018-01-01 DIAGNOSIS — Z7951 Long term (current) use of inhaled steroids: Secondary | ICD-10-CM | POA: Diagnosis not present

## 2018-01-01 DIAGNOSIS — R739 Hyperglycemia, unspecified: Secondary | ICD-10-CM | POA: Diagnosis present

## 2018-01-01 DIAGNOSIS — J449 Chronic obstructive pulmonary disease, unspecified: Secondary | ICD-10-CM | POA: Diagnosis present

## 2018-01-01 DIAGNOSIS — Z85118 Personal history of other malignant neoplasm of bronchus and lung: Secondary | ICD-10-CM | POA: Diagnosis not present

## 2018-01-01 DIAGNOSIS — Z96649 Presence of unspecified artificial hip joint: Secondary | ICD-10-CM | POA: Diagnosis present

## 2018-01-01 DIAGNOSIS — Z9981 Dependence on supplemental oxygen: Secondary | ICD-10-CM | POA: Diagnosis not present

## 2018-01-01 DIAGNOSIS — G893 Neoplasm related pain (acute) (chronic): Secondary | ICD-10-CM | POA: Diagnosis present

## 2018-01-01 LAB — CBC
HCT: 50.1 % (ref 39.0–52.0)
HEMOGLOBIN: 15.2 g/dL (ref 13.0–17.0)
MCH: 28.8 pg (ref 26.0–34.0)
MCHC: 30.3 g/dL (ref 30.0–36.0)
MCV: 94.9 fL (ref 78.0–100.0)
Platelets: 243 10*3/uL (ref 150–400)
RBC: 5.28 MIL/uL (ref 4.22–5.81)
RDW: 13.5 % (ref 11.5–15.5)
WBC: 10.6 10*3/uL — AB (ref 4.0–10.5)

## 2018-01-01 LAB — COMPREHENSIVE METABOLIC PANEL
ALBUMIN: 3.6 g/dL (ref 3.5–5.0)
ALK PHOS: 69 U/L (ref 38–126)
ALT: 20 U/L (ref 17–63)
ANION GAP: 8 (ref 5–15)
AST: 15 U/L (ref 15–41)
BILIRUBIN TOTAL: 0.4 mg/dL (ref 0.3–1.2)
BUN: 12 mg/dL (ref 6–20)
CALCIUM: 9.4 mg/dL (ref 8.9–10.3)
CO2: 29 mmol/L (ref 22–32)
Chloride: 102 mmol/L (ref 101–111)
Creatinine, Ser: 1.02 mg/dL (ref 0.61–1.24)
GFR calc Af Amer: >60 mL/min (ref >60)
GLUCOSE: 175 mg/dL — AB (ref 65–99)
Potassium: 4.3 mmol/L (ref 3.5–5.1)
Sodium: 139 mmol/L (ref 135–145)
TOTAL PROTEIN: 7.2 g/dL (ref 6.5–8.1)

## 2018-01-01 LAB — TROPONIN I
Troponin I: <0.03 ng/mL (ref <0.03)
Troponin I: <0.03 ng/mL (ref <0.03)

## 2018-01-01 LAB — GLUCOSE, CAPILLARY
GLUCOSE-CAPILLARY: 132 mg/dL — AB (ref 65–99)
GLUCOSE-CAPILLARY: 143 mg/dL — AB (ref 65–99)
Glucose-Capillary: 192 mg/dL — ABNORMAL HIGH (ref 65–99)
Glucose-Capillary: 178 mg/dL — ABNORMAL HIGH (ref 65–99)

## 2018-01-01 LAB — HIV ANTIBODY (ROUTINE TESTING W REFLEX): HIV Screen 4th Generation wRfx: NONREACTIVE

## 2018-01-01 MED ORDER — BUDESONIDE 0.5 MG/2ML IN SUSP
0.5000 mg | Freq: Two times a day (BID) | RESPIRATORY_TRACT | Status: DC
  Administered 2018-01-01 – 2018-01-02 (×2): 0.5 mg via RESPIRATORY_TRACT
  Filled 2018-01-01 (×2): qty 2

## 2018-01-01 MED ORDER — IPRATROPIUM-ALBUTEROL 0.5-2.5 (3) MG/3ML IN SOLN
3.0000 mL | Freq: Three times a day (TID) | RESPIRATORY_TRACT | Status: DC
  Administered 2018-01-02: 3 mL via RESPIRATORY_TRACT
  Filled 2018-01-01: qty 3

## 2018-01-01 NOTE — Progress Notes (Signed)
PROGRESS NOTE  Miguel Marsh GQQ:[id number] DOB: [birth year] DOA: 12/31/2017 PCP: [name], DO  Brief History:  57 year old male with a history of metastatic non-small cell lung cancer in remission, hypertension, chronic respiratory failure on 2 L at night, GERD, COPD, tobacco abuse presented with 1 day history of worsening shortness of breath, cough, and white sputum production.  The patient continues to smoke 5 cigarettes/day.  He has a nearly 80 pack year history.  He has subjective fevers and chills, but denied any headache, neck pain, nausea, vomiting, diarrhea, abdominal pain, dysuria.  He tried using his inhalers without improvement.  He denied any worsening lower extremity edema or orthopnea type symptoms.  Upon presentation, the patient was noted to have hypoxia with oxygen saturation in the 80s on room air and WBC 8.2 with unremarkable BMP and LFTs.  BNP was 33.  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, mediastinal mass excision and lymph node dissection with nerve sheath tumor resection 12/04/2017 at [facility] -follow up cardiothoracic at [facility]  Essential hypertension -Restart HCTZ  Hyperglycemia -Check hemoglobin A1c  Morbid obesity  -BMI 40.27 -Lifestyle modification    Disposition Plan:   Home 3/6 or 3/7 if improved Family Communication:   Brother updated at bedside  Consultants:  none  Code Status:  FULL   DVT  Prophylaxis: [place] Lovenox   Procedures: As Listed in Progress Note Above  Antibiotics: azithro 3/4>>>    Subjective: Patient states that he is breathing 25% better.  He still has a nonproductive cough.  He denies any nausea, vomiting, diarrhea, abdominal pain.  He has chronic chest pain which is unchanged.  Objective: Vitals:   12/31/17 2202 01/01/18 0122 01/01/18 0515 01/01/18 0801  BP: (!) 156/96  129/72   Pulse: 99  86   Resp: (!) 21  18   Temp: 98.1 F (36.7 C)  98.7 F (37.1 C)   TempSrc: Oral  Oral   SpO2: 93% 91% 95% 93%  Weight:   113.1 kg (249 lb 6.4 oz)   Height:       No intake or output data in the 24 hours ending 01/01/18 0920 Weight change:  Exam:   General:  Pt is alert, follows commands appropriately, not in acute distress  HEENT: No icterus, No thrush, No neck mass, [place]/AT  Cardiovascular: RRR, S1/S2, no rubs, no gallops  Respiratory: CTA bilaterally, no wheezing, no crackles, no rhonchi  Abdomen: Soft/+BS, non tender, non distended, no guarding  Extremities: No edema, No lymphangitis, No petechiae, No rashes, no synovitis   Data Reviewed: I have personally reviewed following labs and imaging studies Basic Metabolic Panel: Recent Labs  Lab 12/31/17 1228 01/01/18 0436  NA 139 139  K 4.1 4.3  CL 102 102  CO2 29 29  GLUCOSE 97 175*  BUN 7 12  CREATININE 0.76 1.02  CALCIUM 9.3 9.4   Liver Function Tests: Recent Labs  Lab 01/01/18 0436  AST 15  ALT 20  ALKPHOS 69  BILITOT 0.4  PROT 7.2  ALBUMIN 3.6   No results for input(s): LIPASE, AMYLASE in the last 168 hours. No results for input(s): AMMONIA in the last 168 hours. Coagulation Profile: No results for input(s): INR, PROTIME in the last 168 hours. CBC: Recent Labs  Lab 12/31/17 1228 01/01/18 0436  WBC 8.2 10.6*  NEUTROABS 4.7  --   HGB 15.7 15.2  HCT 50.1 50.1  MCV 94.4 94.9  PLT 229 243   Cardiac Enzymes: No results for input(s): CKTOTAL, CKMB, CKMBINDEX, TROPONINI  in the last 168 hours. BNP: Invalid input(s): POCBNP CBG: Recent Labs  Lab 12/31/17 1747 12/31/17 2206 01/01/18 0729  GLUCAP 159* 192* 132*   HbA1C: No results for input(s): HGBA1C in the last 72 hours. Urine analysis:    Component Value Date/Time   COLORURINE YELLOW 01/22/2017 1524   APPEARANCEUR CLOUDY (A) 01/22/2017 1524   LABSPEC 1.021 01/22/2017 1524   PHURINE 7.0 01/22/2017 1524   GLUCOSEU NEGATIVE 01/22/2017 1524   HGBUR NEGATIVE 01/22/2017 1524   BILIRUBINUR NEGATIVE 01/22/2017 1524   KETONESUR NEGATIVE 01/22/2017 1524   PROTEINUR NEGATIVE 01/22/2017 1524   UROBILINOGEN 0.2 10/21/2013 0200   NITRITE NEGATIVE 01/22/2017 1524   LEUKOCYTESUR NEGATIVE 01/22/2017 1524   Sepsis Labs: @LABRCNTIP (procalcitonin:4,lacticidven:4) )No results found for this or any previous visit (from the past 240 hour(s)).   Scheduled Meds: . azithromycin  500 mg Oral Daily  . enoxaparin (LOVENOX) injection  60 mg Subcutaneous Q24H  . insulin aspart  0-9 Units Subcutaneous TID WC  . ipratropium-albuterol  3 mL Nebulization Q6H  . methylPREDNISolone (SOLU-MEDROL) injection  40 mg Intravenous Q8H   Continuous Infusions:  Procedures/Studies: Dg Chest 2 View  Result Date: 12/31/2017 CLINICAL DATA:  Worsening shortness of breath. 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 EXAM: CHEST  2 VIEW COMPARISON:  12/09/2015 FINDINGS: The cardiomediastinal silhouette is unchanged and within normal limits. There are chronic bronchitic changes. The left lung is better inflated than on the prior study with minimal scarring noted in the left lung base. Triangular left basilar opacity on the prior study is no longer readily apparent. There is a small right pleural effusion. No pneumothorax is identified. No acute osseous abnormality is seen. IMPRESSION: 1. New small right pleural effusion. 2. Chronic bronchitic changes. 3. Improved aeration of the left lung base. Electronically Signed    By: Logan Bores M.D.   On: 12/31/2017 12:46    [name], DO  [facility] Pager [phone]  If 7PM-7AM, please contact night-coverage [email or web address] Password [email or web address] 01/01/2018, 9:20 AM   LOS: 0 days

## 2018-01-02 DIAGNOSIS — J441 Chronic obstructive pulmonary disease with (acute) exacerbation: Secondary | ICD-10-CM | POA: Diagnosis not present

## 2018-01-02 LAB — GLUCOSE, CAPILLARY
GLUCOSE-CAPILLARY: 130 mg/dL — AB (ref 65–99)
GLUCOSE-CAPILLARY: 133 mg/dL — AB (ref 65–99)

## 2018-01-02 MED ORDER — PREDNISONE 10 MG PO TABS: 10.0000 mg | ORAL_TABLET | Freq: Every day | ORAL | 0 refills | Status: DC

## 2018-01-02 MED ORDER — AZITHROMYCIN 250 MG PO TABS: ORAL_TABLET | ORAL | 0 refills | Status: DC

## 2018-01-02 NOTE — Progress Notes (Signed)
Discharge instructions read to patient.  Pt verbalized understanding of instructions. Discharge to home with friend.

## 2018-01-02 NOTE — Discharge Summary (Signed)
Physician Discharge Summary  Miguel Marsh AST:[id number] DOB: 1961-04-16 DOA: 12/31/2017  PCP: [name], DO  Admit date: 12/31/2017 Discharge date: 01/02/2018  Time spent: 45 minutes  Recommendations for Outpatient Follow-up:  -Will be discharged home today. -Advised to follow-up with PCP in 2 weeks. -Discharged with 5 days of azithromycin and a prednisone taper.  Discharge Diagnoses:  Active Problems:   Tobacco use disorder   COPD exacerbation (HCC)   Essential hypertension   Non-small cell cancer of right lung (HCC)   Acute on chronic respiratory failure with hypoxia (HCC)   Obesity, Class III, BMI 40-49.9 (morbid obesity) ([place])   COPD with exacerbation [facility])   Discharge Condition: Stable and improved  Filed Weights   12/31/17 1701 01/01/18 0515 01/02/18 9892  Weight: 114.3 kg (251 lb 14.4 oz) 113.1 kg (249 lb 6.4 oz) 113 kg (249 lb 1.6 oz)    History of present illness:  As per Dr. Reesa Chew on 3/4: Miguel Marsh is a 57 y.o. male with medical history significant of essential hypertension, metastatic non-small cell lung cancer in remission, COPD on 2 L nasal cannula at night, GERD came to the hospital with complains of shortness of breath.  Patient states for the past week he has had some mild productive cough bringing up whitish sputum with exertion or shortness of breath.  The symptoms worsened last night and he ended up calling his caregiver this morning who advised him to come to the ER for further care.  Patient tried using home nebulizer treatment without any relief.  Denies any fevers, chills, chest pain and any other complaints.  In the ER patient was noted to be hypoxic and 80% especially with ambulation on 2 L nasal cannula.  He was noted to have coarse breath sounds and found to be in COPD exacerbation.  He was started on Solu-Medrol and nebulizer treatments.  Due to hypoxia with minimal exertion it was determined to admit him for further  care.    Hospital Course:   Acute on chronic hypoxemic respiratory failure -On account of COPD with acute exacerbation. -Has symptomatically improved, chest auscultation is clear. -No signs of pneumonia, however will continue 5 days of empiric azithromycin. -Discharge on prednisone taper as well as his home Spiriva, Symbicort and albuterol.  Hypertension -Well-controlled, continue hydrochlorothiazide  History of metastatic non-small cell lung cancer -Follow-up with oncology as scheduled.  GERD -Continue Protonix.  Procedures:  None   Consultations:  None  Discharge Instructions  Discharge Instructions    Diet - low sodium heart healthy   Complete by:  As directed    Increase activity slowly   Complete by:  As directed      Allergies as of 01/02/2018   No Known Allergies     Medication List    TAKE these medications   albuterol 108 (90 Base) MCG/ACT inhaler Commonly known as:  PROVENTIL HFA;VENTOLIN HFA Inhale 2 puffs into the lungs every 6 (six) hours as needed for wheezing.   albuterol (2.5 MG/3ML) 0.083% nebulizer solution Commonly known as:  PROVENTIL Take 2.5 mg by nebulization every 4 (four) hours as needed for wheezing or shortness of breath.   azithromycin 250 MG tablet Commonly known as:  ZITHROMAX Take 1 tablet daily for 5 days Start taking on:  01/03/2018   guaiFENesin 600 MG 12 hr tablet Commonly known as:  MUCINEX Take 1 tablet (600 mg total) by mouth 2 (two) times daily.   hydrochlorothiazide 12.5 MG capsule Commonly known  as:  MICROZIDE Take 1 capsule by mouth daily.   naproxen sodium 220 MG tablet Commonly known as:  ALEVE Take 440 mg by mouth daily as needed (for pain).   nicotine polacrilex 2 MG gum Commonly known as:  NICORETTE Take 2 mg by mouth as needed for smoking cessation.   Oxycodone HCl 10 MG Tabs Take 10 mg by mouth every 4 (four) hours as needed (pain).   predniSONE 10 MG tablet Commonly known as:  DELTASONE Take 1  tablet (10 mg total) by mouth daily with breakfast.   PRILOSEC 40 MG capsule Generic drug:  omeprazole Take 40 mg by mouth daily.   senna-docusate 8.6-50 MG tablet Commonly known as:  Senokot-S Take 2 tablets by mouth at bedtime as needed for mild constipation.   SYMBICORT 80-4.5 MCG/ACT inhaler Generic drug:  budesonide-formoterol Inhale 2 puffs into the lungs 2 (two) times daily.   tiotropium 18 MCG inhalation capsule Commonly known as:  SPIRIVA Place 18 mcg into inhaler and inhale daily.      No Known Allergies Follow-up Information    [name], DO. Schedule an appointment as soon as possible for a visit in 2 week(s).   Contact information: [address] [place] [id number] [phone]            The results of significant diagnostics from this hospitalization (including imaging, microbiology, ancillary and laboratory) are listed below for reference.    Significant Diagnostic Studies: Dg Chest 2 View  Result Date: 12/31/2017 CLINICAL DATA:  Worsening shortness of breath. 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 EXAM: CHEST  2 VIEW COMPARISON:  12/09/2015 FINDINGS: The cardiomediastinal silhouette is unchanged and within normal limits. There are chronic bronchitic changes. The left lung is better inflated than on the prior study with minimal scarring noted in the left lung base. Triangular left basilar opacity on the prior study is no longer readily apparent. There is a small right pleural effusion. No pneumothorax is identified. No acute osseous abnormality is seen. IMPRESSION: 1. New small right pleural effusion. 2. Chronic bronchitic changes. 3. Improved aeration of the left lung base. Electronically Signed   By: Logan Bores M.D.   On: 12/31/2017 12:46    Microbiology: No results found for this or any previous visit (from the past 240 hour(s)).   Labs: Basic Metabolic Panel: Recent Labs  Lab 12/31/17 1228 01/01/18 0436   NA 139 139  K 4.1 4.3  CL 102 102  CO2 29 29  GLUCOSE 97 175*  BUN 7 12  CREATININE 0.76 1.02  CALCIUM 9.3 9.4   Liver Function Tests: Recent Labs  Lab 01/01/18 0436  AST 15  ALT 20  ALKPHOS 69  BILITOT 0.4  PROT 7.2  ALBUMIN 3.6   No results for input(s): LIPASE, AMYLASE in the last 168 hours. No results for input(s): AMMONIA in the last 168 hours. CBC: Recent Labs  Lab 12/31/17 1228 01/01/18 0436  WBC 8.2 10.6*  NEUTROABS 4.7  --   HGB 15.7 15.2  HCT 50.1 50.1  MCV 94.4 94.9  PLT 229 243   Cardiac Enzymes: Recent Labs  Lab 01/01/18 0953 01/01/18 1517  TROPONINI <0.03 <0.03   BNP: BNP (last 3 results) Recent Labs    12/31/17 1229  BNP 33.0    ProBNP (last 3 results) No results for input(s): PROBNP in the last 8760 hours.  CBG: Recent Labs  Lab 01/01/18 0729 01/01/18 1159 01/01/18 1606 01/01/18 2058 01/02/18  [place]       Signed:  [facility] Pager: [phone] 01/02/2018, 4:27 PM

## 2018-01-23 ENCOUNTER — Emergency Department (HOSPITAL_COMMUNITY): Payer: Medicare HMO

## 2018-01-23 ENCOUNTER — Inpatient Hospital Stay (HOSPITAL_COMMUNITY)
Admission: EM | Admit: 2018-01-23 | Discharge: 2018-01-28 | DRG: 190 | Disposition: A | Discharge status: Home / Self Care | Payer: Medicare HMO | Attending: Internal Medicine | Admitting: Internal Medicine

## 2018-01-23 ENCOUNTER — Encounter (HOSPITAL_COMMUNITY): Payer: Self-pay | Admitting: Emergency Medicine

## 2018-01-23 ENCOUNTER — Other Ambulatory Visit: Payer: Self-pay

## 2018-01-23 DIAGNOSIS — R911 Solitary pulmonary nodule: Secondary | ICD-10-CM | POA: Diagnosis present

## 2018-01-23 DIAGNOSIS — C3491 Malignant neoplasm of unspecified part of right bronchus or lung: Secondary | ICD-10-CM | POA: Diagnosis not present

## 2018-01-23 DIAGNOSIS — Z79899 Other long term (current) drug therapy: Secondary | ICD-10-CM | POA: Diagnosis not present

## 2018-01-23 DIAGNOSIS — Z7952 Long term (current) use of systemic steroids: Secondary | ICD-10-CM

## 2018-01-23 DIAGNOSIS — R000 Tachycardia, unspecified: Secondary | ICD-10-CM | POA: Diagnosis present

## 2018-01-23 DIAGNOSIS — Z9981 Dependence on supplemental oxygen: Secondary | ICD-10-CM | POA: Diagnosis not present

## 2018-01-23 DIAGNOSIS — R739 Hyperglycemia, unspecified: Secondary | ICD-10-CM | POA: Diagnosis present

## 2018-01-23 DIAGNOSIS — Z7951 Long term (current) use of inhaled steroids: Secondary | ICD-10-CM | POA: Diagnosis not present

## 2018-01-23 DIAGNOSIS — Z6841 Body Mass Index (BMI) 40.0 and over, adult: Secondary | ICD-10-CM

## 2018-01-23 DIAGNOSIS — Y92009 Unspecified place in unspecified non-institutional (private) residence as the place of occurrence of the external cause: Secondary | ICD-10-CM | POA: Diagnosis not present

## 2018-01-23 DIAGNOSIS — Z79891 Long term (current) use of opiate analgesic: Secondary | ICD-10-CM

## 2018-01-23 DIAGNOSIS — J9621 Acute and chronic respiratory failure with hypoxia: Secondary | ICD-10-CM | POA: Diagnosis present

## 2018-01-23 DIAGNOSIS — K5900 Constipation, unspecified: Secondary | ICD-10-CM | POA: Diagnosis not present

## 2018-01-23 DIAGNOSIS — J90 Pleural effusion, not elsewhere classified: Secondary | ICD-10-CM | POA: Diagnosis present

## 2018-01-23 DIAGNOSIS — J441 Chronic obstructive pulmonary disease with (acute) exacerbation: Principal | ICD-10-CM | POA: Diagnosis present

## 2018-01-23 DIAGNOSIS — I10 Essential (primary) hypertension: Secondary | ICD-10-CM | POA: Diagnosis present

## 2018-01-23 DIAGNOSIS — J9600 Acute respiratory failure, unspecified whether with hypoxia or hypercapnia: Secondary | ICD-10-CM | POA: Diagnosis present

## 2018-01-23 DIAGNOSIS — X58XXXA Exposure to other specified factors, initial encounter: Secondary | ICD-10-CM | POA: Diagnosis present

## 2018-01-23 DIAGNOSIS — G893 Neoplasm related pain (acute) (chronic): Secondary | ICD-10-CM | POA: Diagnosis present

## 2018-01-23 DIAGNOSIS — E66813 Obesity, class 3: Secondary | ICD-10-CM | POA: Diagnosis present

## 2018-01-23 DIAGNOSIS — T148XXA Other injury of unspecified body region, initial encounter: Secondary | ICD-10-CM | POA: Diagnosis present

## 2018-01-23 DIAGNOSIS — F17210 Nicotine dependence, cigarettes, uncomplicated: Secondary | ICD-10-CM | POA: Diagnosis present

## 2018-01-23 DIAGNOSIS — J9622 Acute and chronic respiratory failure with hypercapnia: Secondary | ICD-10-CM | POA: Diagnosis not present

## 2018-01-23 DIAGNOSIS — Z85118 Personal history of other malignant neoplasm of bronchus and lung: Secondary | ICD-10-CM

## 2018-01-23 DIAGNOSIS — Z791 Long term (current) use of non-steroidal anti-inflammatories (NSAID): Secondary | ICD-10-CM

## 2018-01-23 DIAGNOSIS — K760 Fatty (change of) liver, not elsewhere classified: Secondary | ICD-10-CM | POA: Diagnosis present

## 2018-01-23 DIAGNOSIS — K219 Gastro-esophageal reflux disease without esophagitis: Secondary | ICD-10-CM | POA: Diagnosis present

## 2018-01-23 DIAGNOSIS — T380X5A Adverse effect of glucocorticoids and synthetic analogues, initial encounter: Secondary | ICD-10-CM | POA: Diagnosis present

## 2018-01-23 DIAGNOSIS — J9620 Acute and chronic respiratory failure, unspecified whether with hypoxia or hypercapnia: Secondary | ICD-10-CM | POA: Diagnosis present

## 2018-01-23 DIAGNOSIS — J449 Chronic obstructive pulmonary disease, unspecified: Secondary | ICD-10-CM | POA: Diagnosis present

## 2018-01-23 LAB — RESPIRATORY PANEL BY PCR
ADENOVIRUS-RVPPCR: NOT DETECTED
BORDETELLA PERTUSSIS-RVPCR: NOT DETECTED
CORONAVIRUS HKU1-RVPPCR: NOT DETECTED
CORONAVIRUS OC43-RVPPCR: NOT DETECTED
Chlamydophila pneumoniae: NOT DETECTED
Coronavirus 229E: NOT DETECTED
Coronavirus NL63: NOT DETECTED
Influenza A: NOT DETECTED
Influenza B: NOT DETECTED
Metapneumovirus: NOT DETECTED
Mycoplasma pneumoniae: NOT DETECTED
PARAINFLUENZA VIRUS 2-RVPPCR: NOT DETECTED
PARAINFLUENZA VIRUS 3-RVPPCR: NOT DETECTED
Parainfluenza Virus 1: NOT DETECTED
Parainfluenza Virus 4: NOT DETECTED
RHINOVIRUS / ENTEROVIRUS - RVPPCR: NOT DETECTED
Respiratory Syncytial Virus: NOT DETECTED

## 2018-01-23 LAB — BLOOD GAS, ARTERIAL
Acid-Base Excess: 3.5 mmol/L — ABNORMAL HIGH (ref 0.0–2.0)
BICARBONATE: 25.5 mmol/L (ref 20.0–28.0)
Delivery systems: POSITIVE
Drawn by: 382351
EXPIRATORY PAP: 6
FIO2: 40
Inspiratory PAP: 12
O2 Saturation: 95.3 %
PATIENT TEMPERATURE: 37
PH ART: 7.303 — AB (ref 7.350–7.450)
pCO2 arterial: 61 mmHg — ABNORMAL HIGH (ref 32.0–48.0)
pO2, Arterial: 83.9 mmHg (ref 83.0–108.0)

## 2018-01-23 LAB — CBC WITH DIFFERENTIAL/PLATELET
BASOS ABS: 0 10*3/uL (ref 0.0–0.1)
BASOS PCT: 0 %
Eosinophils Absolute: 0 10*3/uL (ref 0.0–0.7)
Eosinophils Relative: 0 %
HEMATOCRIT: 50.5 % (ref 39.0–52.0)
HEMOGLOBIN: 16.3 g/dL (ref 13.0–17.0)
LYMPHS PCT: 22 %
Lymphs Abs: 2.5 10*3/uL (ref 0.7–4.0)
MCH: 30 pg (ref 26.0–34.0)
MCHC: 32.3 g/dL (ref 30.0–36.0)
MCV: 92.8 fL (ref 78.0–100.0)
MONOS PCT: 6 %
Monocytes Absolute: 0.7 10*3/uL (ref 0.1–1.0)
NEUTROS ABS: 8.2 10*3/uL — AB (ref 1.7–7.7)
NEUTROS PCT: 72 %
Platelets: 287 10*3/uL (ref 150–400)
RBC: 5.44 MIL/uL (ref 4.22–5.81)
RDW: 13.3 % (ref 11.5–15.5)
WBC: 11.4 10*3/uL — ABNORMAL HIGH (ref 4.0–10.5)

## 2018-01-23 LAB — BASIC METABOLIC PANEL
ANION GAP: 12 (ref 5–15)
BUN: 9 mg/dL (ref 6–20)
CO2: 26 mmol/L (ref 22–32)
Calcium: 9.4 mg/dL (ref 8.9–10.3)
Chloride: 99 mmol/L — ABNORMAL LOW (ref 101–111)
Creatinine, Ser: 0.84 mg/dL (ref 0.61–1.24)
GFR calc non Af Amer: >60 mL/min (ref >60)
GLUCOSE: 150 mg/dL — AB (ref 65–99)
Potassium: 3.9 mmol/L (ref 3.5–5.1)
Sodium: 137 mmol/L (ref 135–145)

## 2018-01-23 LAB — TROPONIN I: Troponin I: <0.03 ng/mL (ref <0.03)

## 2018-01-23 LAB — MAGNESIUM: MAGNESIUM: 1.6 mg/dL — AB (ref 1.7–2.4)

## 2018-01-23 LAB — PHOSPHORUS: PHOSPHORUS: 4 mg/dL (ref 2.5–4.6)

## 2018-01-23 MED ORDER — MAGNESIUM SULFATE 4 GM/100ML IV SOLN: 4.0000 g | Freq: Once | INTRAVENOUS | Status: DC

## 2018-01-23 MED ORDER — ORAL CARE MOUTH RINSE
15.0000 mL | Freq: Two times a day (BID) | OROMUCOSAL | Status: DC
  Administered 2018-01-23 – 2018-01-27 (×7): 15 mL via OROMUCOSAL

## 2018-01-23 MED ORDER — BUDESONIDE 0.5 MG/2ML IN SUSP
0.5000 mg | Freq: Two times a day (BID) | RESPIRATORY_TRACT | Status: DC
  Administered 2018-01-23 – 2018-01-28 (×10): 0.5 mg via RESPIRATORY_TRACT
  Filled 2018-01-23 (×21): qty 2

## 2018-01-23 MED ORDER — PANTOPRAZOLE SODIUM 40 MG PO TBEC
80.0000 mg | DELAYED_RELEASE_TABLET | Freq: Every day | ORAL | Status: DC
  Administered 2018-01-23 – 2018-01-28 (×6): 80 mg via ORAL
  Filled 2018-01-23 (×6): qty 2

## 2018-01-23 MED ORDER — METHYLPREDNISOLONE SODIUM SUCC 125 MG IJ SOLR
60.0000 mg | Freq: Four times a day (QID) | INTRAMUSCULAR | Status: DC
  Administered 2018-01-23 – 2018-01-24 (×3): 60 mg via INTRAVENOUS
  Filled 2018-01-23 (×3): qty 2

## 2018-01-23 MED ORDER — ACETAMINOPHEN 650 MG RE SUPP: 650.0000 mg | Freq: Four times a day (QID) | RECTAL | Status: DC | PRN

## 2018-01-23 MED ORDER — IPRATROPIUM-ALBUTEROL 0.5-2.5 (3) MG/3ML IN SOLN
3.0000 mL | Freq: Once | RESPIRATORY_TRACT | Status: AC
  Administered 2018-01-23: 3 mL via RESPIRATORY_TRACT
  Filled 2018-01-23: qty 3

## 2018-01-23 MED ORDER — MAGNESIUM SULFATE 2 GM/50ML IV SOLN
2.0000 g | Freq: Once | INTRAVENOUS | Status: AC
  Administered 2018-01-23: 2 g via INTRAVENOUS
  Filled 2018-01-23: qty 50

## 2018-01-23 MED ORDER — ENOXAPARIN SODIUM 40 MG/0.4ML ~~LOC~~ SOLN: 40.0000 mg | SUBCUTANEOUS | Status: DC

## 2018-01-23 MED ORDER — ONDANSETRON HCL 4 MG PO TABS: 4.0000 mg | ORAL_TABLET | Freq: Four times a day (QID) | ORAL | Status: DC | PRN

## 2018-01-23 MED ORDER — ONDANSETRON HCL 4 MG/2ML IJ SOLN: 4.0000 mg | Freq: Four times a day (QID) | INTRAMUSCULAR | Status: DC | PRN

## 2018-01-23 MED ORDER — ALBUTEROL (5 MG/ML) CONTINUOUS INHALATION SOLN
15.0000 mg/h | INHALATION_SOLUTION | Freq: Once | RESPIRATORY_TRACT | Status: AC
  Administered 2018-01-23: 15 mg/h via RESPIRATORY_TRACT
  Filled 2018-01-23: qty 20

## 2018-01-23 MED ORDER — METHYLPREDNISOLONE SODIUM SUCC 125 MG IJ SOLR
60.0000 mg | Freq: Once | INTRAMUSCULAR | Status: AC
  Administered 2018-01-23: 60 mg via INTRAVENOUS
  Filled 2018-01-23: qty 2

## 2018-01-23 MED ORDER — SENNOSIDES-DOCUSATE SODIUM 8.6-50 MG PO TABS
2.0000 | ORAL_TABLET | Freq: Every evening | ORAL | Status: DC | PRN
  Administered 2018-01-26: 2 via ORAL
  Filled 2018-01-23: qty 2

## 2018-01-23 MED ORDER — ACETAMINOPHEN 325 MG PO TABS
650.0000 mg | ORAL_TABLET | Freq: Four times a day (QID) | ORAL | Status: DC | PRN
  Filled 2018-01-23 (×3): qty 2

## 2018-01-23 MED ORDER — ALBUTEROL SULFATE (2.5 MG/3ML) 0.083% IN NEBU
2.5000 mg | INHALATION_SOLUTION | Freq: Once | RESPIRATORY_TRACT | Status: AC
  Administered 2018-01-23: 2.5 mg via RESPIRATORY_TRACT
  Filled 2018-01-23: qty 3

## 2018-01-23 MED ORDER — ENOXAPARIN SODIUM 60 MG/0.6ML ~~LOC~~ SOLN
0.5000 mg/kg | SUBCUTANEOUS | Status: DC
  Administered 2018-01-23 – 2018-01-28 (×6): 55 mg via SUBCUTANEOUS
  Filled 2018-01-23 (×6): qty 0.6

## 2018-01-23 MED ORDER — OXYCODONE HCL 5 MG PO TABS
10.0000 mg | ORAL_TABLET | ORAL | Status: DC | PRN
  Administered 2018-01-25 – 2018-01-28 (×13): 10 mg via ORAL
  Filled 2018-01-23 (×13): qty 2

## 2018-01-23 MED ORDER — ACETAMINOPHEN 325 MG PO TABS
650.0000 mg | ORAL_TABLET | Freq: Four times a day (QID) | ORAL | Status: DC | PRN
  Administered 2018-01-23 – 2018-01-25 (×3): 650 mg via ORAL

## 2018-01-23 MED ORDER — IPRATROPIUM-ALBUTEROL 0.5-2.5 (3) MG/3ML IN SOLN
3.0000 mL | Freq: Four times a day (QID) | RESPIRATORY_TRACT | Status: DC
  Administered 2018-01-23 – 2018-01-28 (×19): 3 mL via RESPIRATORY_TRACT
  Filled 2018-01-23 (×19): qty 3

## 2018-01-23 NOTE — ED Triage Notes (Signed)
Pt brought in by [facility] for c/o difficulty breathing; pt states he got up to go to bathroom around 11pm this evening and started having sob that has gotten progressively worse; when ems arrived pt was found leaning over couch with severe sob; pt's O2 sats at 90%; pt was given 2 albuterol treatments and 125mg  solumedrol en route by ems; pt having to stand up and lean over counter to breath

## 2018-01-23 NOTE — ED Provider Notes (Signed)
[facility] EMERGENCY DEPARTMENT Provider Note   CSN: [id number] Arrival date & time: 01/23/18  0246     History   Chief Complaint Chief Complaint  Patient presents with  . Respiratory Distress    HPI Miguel Marsh is a 57 y.o. male.  HPI  This a 57 year old male with a history of COPD, metastatic non-small cell lung cancer status post resection, hypertension who presents with shortness of breath.  Patient reports onset of symptoms 3-4 hours ago.  He at baseline wears 2 L of nasal cannula at home.  He reports a recent nonproductive cough.  He used his nebulizers without improvement.  He called EMS.  O2 sats by EMS were 90%.  He was given albuterol and Solu-Medrol.  Patient denies any chest pain.  He was placed on BiPAP upon arrival.  He states that he feels somewhat better with BiPAP.  He does report some right flank pain which has been ongoing since his resection surgery.  Denies any recent leg swelling or fevers.  Past Medical History:  Diagnosis Date  . Arthritis   . Bronchitis   . Cancer [facility])    metastatic NSCLC (followed by [facility])  . COPD (chronic obstructive pulmonary disease) ([place])   . HTN (hypertension)     Patient Active Problem List   Diagnosis Date Noted  . Acute on chronic respiratory failure with hypoxia ([place]) 01/01/2018  . Obesity, Class III, BMI 40-49.9 (morbid obesity) ([place]) 01/01/2018  . COPD with exacerbation ([place]) 01/01/2018  . Essential hypertension 12/09/2015  . Non-small cell cancer of right lung ([place]) 12/09/2015  . GERD (gastroesophageal reflux disease) 12/09/2015  . COPD with acute exacerbation ([place]) 12/17/2014  . Acute on chronic respiratory failure ([place]) 12/17/2014  . Tobacco use disorder 12/17/2014  . COPD exacerbation ([place]) 12/17/2014  . Obesity 12/17/2014  . Dyspnea   . Difficulty in walking(719.7) 04/02/2013  . Hip weakness 04/02/2013    Past Surgical History:  Procedure Laterality Date  . LUNG BIOPSY    . TOTAL HIP ARTHROPLASTY      . TUMOR REMOVAL Right 12/04/2017        Home Medications    Prior to Admission medications   Medication Sig Start Date End Date Taking? Authorizing Provider  albuterol (PROVENTIL HFA;VENTOLIN HFA) 108 (90 BASE) MCG/ACT inhaler Inhale 2 puffs into the lungs every 6 (six) hours as needed for wheezing.    [provider]  albuterol (PROVENTIL) (2.5 MG/3ML) 0.083% nebulizer solution Take 2.5 mg by nebulization every 4 (four) hours as needed for wheezing or shortness of breath.  11/22/15   [provider]  azithromycin (ZITHROMAX) 250 MG tablet Take 1 tablet daily for 5 days 01/03/18   Isaac Bliss, Rayford Halsted, MD  guaiFENesin (MUCINEX) 600 MG 12 hr tablet Take 1 tablet (600 mg total) by mouth 2 (two) times daily. 12/12/15   Kathie Dike, MD  hydrochlorothiazide (MICROZIDE) 12.5 MG capsule Take 1 capsule by mouth daily. 12/02/14   [provider]  naproxen sodium (ANAPROX) 220 MG tablet Take 440 mg by mouth daily as needed (for pain).    [provider]  nicotine polacrilex (NICORETTE) 2 MG gum Take 2 mg by mouth as needed for smoking cessation.    [provider]  omeprazole (PRILOSEC) 40 MG capsule Take 40 mg by mouth daily. 04/06/15   [provider]  Oxycodone HCl 10 MG TABS Take 10 mg by mouth every 4 (four) hours as needed (pain).  07/12/15   [provider]  predniSONE (DELTASONE) 10 MG tablet Take 1 tablet (10 mg total) by mouth daily with breakfast. 01/02/18   Isaac Bliss, Rayford Halsted, MD  senna-docusate (SENOKOT-S) 8.6-50 MG per tablet Take 2 tablets by mouth at bedtime as needed for mild constipation.  06/15/15   [provider]  SYMBICORT 80-4.5 MCG/ACT inhaler Inhale 2 puffs into the lungs 2 (two) times daily. 12/02/14   [provider]  tiotropium (SPIRIVA) 18 MCG inhalation capsule Place 18 mcg into inhaler and inhale daily.    [provider]    Family History No family history on file.  Social  History Social History   Tobacco Use  . Smoking status: Current Every Day Smoker    Packs/day: 0.10    Types: Cigarettes  . Smokeless tobacco: Never Used  Substance Use Topics  . Alcohol use: Yes    Comment: occ  . Drug use: No    Types: Marijuana, Cocaine    Comment: former     Allergies   Patient has no known allergies.   Review of Systems Review of Systems  Constitutional: Negative for fever.  Respiratory: Positive for cough, shortness of breath and wheezing.   Cardiovascular: Negative for chest pain.  Gastrointestinal: Negative for abdominal pain, nausea and vomiting.  Genitourinary: Positive for flank pain.  Musculoskeletal: Negative for back pain.  All other systems reviewed and are negative.    Physical Exam Updated Vital Signs BP 122/86   Pulse (!) 108   Temp 98.7 F (37.1 C) (Oral)   Resp 17   Ht 5\' 6"  (1.676 m)   Wt 113.4 kg (250 lb)   SpO2 91%   BMI 40.35 kg/m   Physical Exam  Constitutional: He is oriented to person, place, and time. He appears well-developed and well-nourished.  Obese, mildly tachypneic, BiPAP in place  HENT:  Head: Normocephalic and atraumatic.  Cardiovascular: Regular rhythm and normal heart sounds.  No murmur heard. Tachycardia  Pulmonary/Chest: He is in respiratory distress. He has wheezes.  Increased respiratory rate, BiPAP in place, patient speaking in short sentences, distant breath sounds and limited by habitus, expiratory wheezing noted in all lung fields  Abdominal: Soft. Bowel sounds are normal. There is no tenderness. There is no rebound.  Musculoskeletal: He exhibits edema.  Neurological: He is alert and oriented to person, place, and time.  Skin: Skin is warm and dry.  Psychiatric: He has a normal mood and affect.  Nursing note and vitals reviewed.    ED Treatments / Results  Labs (all labs ordered are listed, but only abnormal results are displayed) Labs Reviewed  BLOOD GAS, ARTERIAL - Abnormal; Notable  for the following components:      Result Value   pH, Arterial 7.303 (*)    pCO2 arterial 61.0 (*)    Acid-Base Excess 3.5 (*)    All other components within normal limits  CBC WITH DIFFERENTIAL/PLATELET - Abnormal; Notable for the following components:   WBC 11.4 (*)    Neutro Abs 8.2 (*)    All other components within normal limits  BASIC METABOLIC PANEL - Abnormal; Notable for the following components:   Chloride 99 (*)    Glucose, Bld 150 (*)    All other components within normal limits  TROPONIN I    EKG None  Radiology Dg Chest Portable 1 View  Result Date: 01/23/2018 CLINICAL DATA:  57 y/o M; worsening shortness of breath. Metastatic lung cancer. EXAM: PORTABLE CHEST 1 VIEW COMPARISON:  12/31/2017  chest radiograph FINDINGS: Stable cardiac silhouette given projection and technique. Right mid lung zone platelike atelectasis or scarring. Blunting of right costal diaphragmatic angle, probable small lesion is stable. Pulmonary venous hypertension. Postsurgical changes in left paratracheal region. Bones are unremarkable. IMPRESSION: 1. Stable small left effusion.  No consolidation. 2. Pulmonary venous hypertension. Electronically Signed   By: Kristine Garbe M.D.   On: 01/23/2018 04:15    Procedures Procedures (including critical care time)  CRITICAL CARE Performed by: Merryl Hacker   Total critical care time: 40 minutes  Critical care time was exclusive of separately billable procedures and treating other patients.  Critical care was necessary to treat or prevent imminent or life-threatening deterioration.  Critical care was time spent personally by me on the following activities: development of treatment plan with patient and/or surrogate as well as nursing, discussions with consultants, evaluation of patient's response to treatment, examination of patient, obtaining history from patient or surrogate, ordering and performing treatments and interventions,  ordering and review of laboratory studies, ordering and review of radiographic studies, pulse oximetry and re-evaluation of patient's condition.   Medications Ordered in ED Medications  ipratropium-albuterol (DUONEB) 0.5-2.5 (3) MG/3ML nebulizer solution 3 mL (3 mLs Nebulization Given 01/23/18 0313)  albuterol (PROVENTIL) (2.5 MG/3ML) 0.083% nebulizer solution 2.5 mg (2.5 mg Nebulization Given 01/23/18 0313)  albuterol (PROVENTIL,VENTOLIN) solution continuous neb (15 mg/hr Nebulization Given 01/23/18 0448)     Initial Impression / Assessment and Plan / ED Course  I have reviewed the triage vital signs and the nursing notes.  Pertinent labs & imaging results that were available during my care of the patient were reviewed by me and considered in my medical decision making (see chart for details).  Clinical Course as of Jan 23 599  Wed Jan 23, 2018  0435 Patient taken off BiPAP.  Continuous neb ordered.   [CH]  0558 Continuous DuoNeb finished.  Patient remains tachypneic.  He is still tight with only mild improvement of air movement.  He continues to wheeze.  O2 sats are in the high 80s on his 2 L of nasal cannula.  Given current physical exam, do not feel patient will likely be able to ambulate without significant dyspnea.  Will admit for frequent duo nebs.   [CH]    Clinical Course User Index [CH] Treyvin Glidden, Barbette Hair, MD    Patient presents with shortness of breath and wheezing.  Was on BiPAP on my initial evaluation.  Felt more comfortable on BiPAP.  He has tachypnea with some increased work of breathing.  He is tight with diminished air movement.  Afebrile.  Tachycardic.  O2 sats in the low 90s.  X-ray without infiltrate.  Patient was initially given nebulizer through BiPAP.  See clinical course above.  He received Solu-Medrol in route.  On multiple rechecks, he remains somewhat tachypneic.  He has opened up some but remains pretty tight with wheezing.  Given his repeat exam, patient would  benefit from admission for observation and frequent nebs.  Will discuss with admitting hospitalist.  Final Clinical Impressions(s) / ED Diagnoses   Final diagnoses:  COPD exacerbation ([place])  Acute on chronic respiratory failure with hypoxia and hypercapnia [facility])    ED Discharge Orders    None       Merryl Hacker, MD 01/23/18 [phone]

## 2018-01-23 NOTE — ED Notes (Signed)
Pt maintaining respiration and drive without bipap. Pt sitting on side of bed eating breakfast.

## 2018-01-23 NOTE — H&P (Signed)
History and Physical  [name] IWP:[id number] DOB: 1961/10/11 DOA: 01/23/2018   PCP: [name], DO   Patient coming from: Home  Chief Complaint: dyspnea  HPI:  Miguel Marsh is a 57 y.o. male with medical history of metastatic non-small cell lung cancer in remission, hypertension, chronic respiratory failure on 2 L at night, GERD, COPD, tobacco abuse presented with 1 day history of worsening shortness of breath, cough, and white sputum production.  The patient continues to smoke 5 cigarettes/day.  He has a nearly 80 pack year history.  The patient was recently discharged from the hospital after a stay from 12/31/17 through 01/02/18 for acute on chronic respiratory failure due to COPD exacerbation.  The patient was discharged home with prednisone taper.  He was doing well until the evening of 01/22/2018 when he developed worsening shortness of breath and nonproductive cough with occasional white sputum.  He denied any fevers, chills, hemoptysis, nausea, vomiting, diarrhea, abdominal pain, dysuria.  He denies any recent sick contacts.  He used his nebulizers at home without improvement.  EMS was activated.  He was noted to have function saturation of 90% on 2 L.  He was given Solu-Medrol and albuterol by EMS.  He was placed on BiPAP in the emergency department.  He has been since weaned off. In the emergency department, the patient was afebrile hemodynamically stable saturating 96% on 4 L.  BMP and CBC were essentially unremarkable except for WBC of 11.4.  Magnesium was 1.6.  Initial troponin was negative.  EKG showed sinus tachycardia without any ST-T wave changes.  ABG showed 7.3 0/61/84/25 on 40% BiPAP.  Chest x-ray showed a small left effusion without any consolidation.  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 Arthritis   . Bronchitis   . Cancer [facility])    metastatic NSCLC (followed by [facility])  . COPD (chronic obstructive pulmonary disease) ([place])   . HTN (hypertension)    Past Surgical History:  Procedure Laterality Date  . LUNG BIOPSY    . TOTAL HIP ARTHROPLASTY    . TUMOR REMOVAL Right 12/04/2017   Social History:  reports that he has been smoking cigarettes.  He has been smoking about 0.10 packs per day. He has never used smokeless tobacco. He reports that he drinks alcohol. He reports that he does not use drugs.   Family history reviewed--negative for diabetes, hypertension, malignancy in his maternal aunt father side.  No Known Allergies   Prior to Admission medications   Medication Sig Start Date End Date Taking? Authorizing Provider  albuterol (PROVENTIL HFA;VENTOLIN HFA) 108 (90 BASE) MCG/ACT inhaler  Inhale 2 puffs into the lungs every 6 (six) hours as needed for wheezing.    [provider]  albuterol (PROVENTIL) (2.5 MG/3ML) 0.083% nebulizer solution Take 2.5 mg by nebulization every 4 (four) hours as needed for wheezing or shortness of breath.  11/22/15   [provider]  azithromycin (ZITHROMAX) 250 MG tablet Take 1 tablet daily for 5 days 01/03/18   Isaac Bliss, Rayford Halsted, MD  guaiFENesin (MUCINEX) 600 MG 12 hr tablet Take 1 tablet (600 mg total) by mouth 2 (two) times daily. 12/12/15   Kathie Dike, MD  hydrochlorothiazide (MICROZIDE) 12.5 MG capsule Take 1 capsule by mouth daily. 12/02/14   [provider]  naproxen sodium (ANAPROX) 220 MG tablet Take 440 mg by mouth daily as needed (for pain).    [provider]  nicotine polacrilex (NICORETTE) 2 MG gum Take 2 mg by mouth as needed for smoking cessation.    [provider]  omeprazole (PRILOSEC) 40 MG capsule Take 40 mg by mouth daily. 04/06/15   [provider]  Oxycodone HCl 10 MG TABS Take 10 mg by mouth every 4 (four) hours as needed (pain).  07/12/15   [provider]  predniSONE (DELTASONE) 10 MG tablet Take 1 tablet (10 mg total) by mouth daily with breakfast. 01/02/18   Isaac Bliss, Rayford Halsted, MD  senna-docusate (SENOKOT-S) 8.6-50 MG per tablet Take 2 tablets by mouth at bedtime as needed for mild constipation.  06/15/15   [provider]  SYMBICORT 80-4.5 MCG/ACT inhaler Inhale 2 puffs into the lungs 2 (two) times daily. 12/02/14   [provider]  tiotropium (SPIRIVA) 18 MCG inhalation capsule Place 18 mcg into inhaler and inhale daily.    [provider]    Review of Systems:  Constitutional:  No weight loss, night sweats, Fevers, chills, fatigue.  Head&Eyes: No headache.  No vision loss.  No eye pain or scotoma ENT:  No Difficulty swallowing,Tooth/dental problems,Sore throat,  No ear ache, post nasal drip,  Cardio-vascular:  No chest pain, Orthopnea, PND, swelling in lower extremities,  dizziness, palpitations  GI:  No  abdominal pain, nausea, vomiting, diarrhea, loss of appetite, hematochezia, melena, heartburn, indigestion, Resp:  No coughing up of blood .No wheezing.No chest wall deformity  Skin:  no  rash or lesions.  GU:  no dysuria, change in color of urine, no urgency or frequency. No flank pain.  Musculoskeletal:  No joint pain or swelling. No decreased range of motion. No back pain.  Psych:  No change in mood or affect. No depression or anxiety. Neurologic: No headache, no dysesthesia, no focal weakness, no vision loss. No syncope  Physical Exam: Vitals:   01/23/18 0530 01/23/18 0600 01/23/18 0630 01/23/18 0750  BP: 122/86 122/72 128/73   Pulse: (!) 108 (!) 103 (!) 105 92  Resp: 17 20 17 19   Temp:      TempSrc:      SpO2: 91% 91% 95% 96%  Weight:      Height:       General:  A&O x 3, NAD, nontoxic, pleasant/cooperative Head/Eye: No conjunctival hemorrhage, no icterus, [place]/AT, No nystagmus ENT:  No icterus,  No thrush, good dentition, no pharyngeal exudate Neck:  No masses, no lymphadenpathy, no bruits CV:  RRR, no rub, no gallop, no S3 Lung:   Bilateral scattered crackles.  Bilateral expiratory wheeze.  Good air movement. Abdomen: soft/NT, +BS, nondistended, no peritoneal signs Ext: No cyanosis, No rashes, No petechiae, No lymphangitis, No  edema Neuro: CNII-XII intact, strength 4/5 in bilateral upper and lower extremities, no dysmetria  Labs on Admission:  Basic Metabolic Panel: Recent Labs  Lab 01/23/18 0340 01/23/18 0614 01/23/18 0615  NA 137  --   --   K 3.9  --   --   CL 99*  --   --   CO2 26  --   --   GLUCOSE 150*  --   --   BUN 9  --   --   CREATININE 0.84  --   --   CALCIUM 9.4  --   --   MG  --  1.6*  --   PHOS  --   --  4.0   Liver Function Tests: No results for input(s): AST, ALT, ALKPHOS, BILITOT, PROT, ALBUMIN in the last 168 hours. No results for input(s): LIPASE, AMYLASE in the last 168 hours. No results for input(s): AMMONIA in the last 168 hours. CBC: Recent Labs  Lab 01/23/18 0340  WBC 11.4*  NEUTROABS 8.2*  HGB 16.3  HCT 50.5  MCV 92.8  PLT 287   Coagulation Profile: No results for input(s): INR, PROTIME in the last 168  hours. Cardiac Enzymes: Recent Labs  Lab 01/23/18 0340  TROPONINI <0.03   BNP: Invalid input(s): POCBNP CBG: No results for input(s): GLUCAP in the last 168 hours. Urine analysis:    Component Value Date/Time   COLORURINE YELLOW 01/22/2017 1524   APPEARANCEUR CLOUDY (A) 01/22/2017 1524   LABSPEC 1.021 01/22/2017 1524   PHURINE 7.0 01/22/2017 1524   GLUCOSEU NEGATIVE 01/22/2017 1524   HGBUR NEGATIVE 01/22/2017 1524   BILIRUBINUR NEGATIVE 01/22/2017 1524   KETONESUR NEGATIVE 01/22/2017 1524   PROTEINUR NEGATIVE 01/22/2017 1524   UROBILINOGEN 0.2 10/21/2013 0200   NITRITE NEGATIVE 01/22/2017 1524   LEUKOCYTESUR NEGATIVE 01/22/2017 1524   Sepsis Labs: @LABRCNTIP (procalcitonin:4,lacticidven:4) )No results found for this or any previous visit (from the past 240 hour(s)).   Radiological Exams on Admission: Dg Chest Portable 1 View  Result Date: 01/23/2018 CLINICAL DATA:  58 y/o M; worsening shortness of breath. Metastatic lung cancer. EXAM: PORTABLE CHEST 1 VIEW COMPARISON:  12/31/2017 chest radiograph FINDINGS: Stable cardiac silhouette given projection and technique. Right mid lung zone platelike atelectasis or scarring. Blunting of right costal diaphragmatic angle, probable small lesion is stable. Pulmonary venous hypertension. Postsurgical changes in left paratracheal region. Bones are unremarkable. IMPRESSION: 1. Stable small left effusion.  No consolidation. 2. Pulmonary venous hypertension. Electronically Signed   By: Kristine Garbe M.D.   On: 01/23/2018 04:15    EKG: Independently reviewed. Sinus tachycardia, no STT changes    Time spent:60 minutes Code Status:   FULL Family Communication:  No Family at bedside Disposition Plan: expect 2-3 day hospitalization Consults called: none DVT Prophylaxis: [place] Lovenox  [name], DO  [facility] Pager [phone]  If 7PM-7AM, please contact night-coverage [email or web address] Password [facility] 01/23/2018, 8:14  AM

## 2018-01-23 NOTE — Progress Notes (Signed)
MD took patient off BIPAP and placed back on 2 lpm nasal cannula. CAT ordered and administered by RT.

## 2018-01-24 ENCOUNTER — Encounter (HOSPITAL_COMMUNITY): Payer: Self-pay | Admitting: *Deleted

## 2018-01-24 LAB — BASIC METABOLIC PANEL
ANION GAP: 9 (ref 5–15)
BUN: 13 mg/dL (ref 6–20)
CO2: 28 mmol/L (ref 22–32)
Calcium: 9.3 mg/dL (ref 8.9–10.3)
Chloride: 102 mmol/L (ref 101–111)
Creatinine, Ser: 0.87 mg/dL (ref 0.61–1.24)
Glucose, Bld: 200 mg/dL — ABNORMAL HIGH (ref 65–99)
POTASSIUM: 4.8 mmol/L (ref 3.5–5.1)
Sodium: 139 mmol/L (ref 135–145)

## 2018-01-24 LAB — CBC WITH DIFFERENTIAL/PLATELET
BASOS ABS: 0 10*3/uL (ref 0.0–0.1)
BASOS PCT: 0 %
EOS PCT: 0 %
Eosinophils Absolute: 0 10*3/uL (ref 0.0–0.7)
HCT: 48.4 % (ref 39.0–52.0)
Hemoglobin: 15.2 g/dL (ref 13.0–17.0)
Lymphocytes Relative: 5 %
Lymphs Abs: 0.7 10*3/uL (ref 0.7–4.0)
MCH: 29.6 pg (ref 26.0–34.0)
MCHC: 31.4 g/dL (ref 30.0–36.0)
MCV: 94.3 fL (ref 78.0–100.0)
MONO ABS: 1 10*3/uL (ref 0.1–1.0)
Monocytes Relative: 7 %
Neutro Abs: 13.7 10*3/uL — ABNORMAL HIGH (ref 1.7–7.7)
Neutrophils Relative %: 88 %
PLATELETS: 283 10*3/uL (ref 150–400)
RBC: 5.13 MIL/uL (ref 4.22–5.81)
RDW: 13.3 % (ref 11.5–15.5)
WBC: 15.4 10*3/uL — AB (ref 4.0–10.5)

## 2018-01-24 LAB — HEMOGLOBIN A1C
HEMOGLOBIN A1C: 6.1 % — AB (ref 4.8–5.6)
MEAN PLASMA GLUCOSE: 128.37 mg/dL

## 2018-01-24 LAB — MAGNESIUM: MAGNESIUM: 2.1 mg/dL (ref 1.7–2.4)

## 2018-01-24 MED ORDER — PREDNISONE 20 MG PO TABS
60.0000 mg | ORAL_TABLET | Freq: Every day | ORAL | Status: DC
  Administered 2018-01-25: 60 mg via ORAL
  Filled 2018-01-24: qty 3

## 2018-01-24 MED ORDER — METHYLPREDNISOLONE SODIUM SUCC 125 MG IJ SOLR
60.0000 mg | Freq: Four times a day (QID) | INTRAMUSCULAR | Status: AC
  Administered 2018-01-24 (×3): 60 mg via INTRAVENOUS
  Filled 2018-01-24 (×3): qty 2

## 2018-01-24 NOTE — Progress Notes (Signed)
Nutrition Brief Note   Nutrition Screen performed as part of the COPD Gold Order set.  Wt Readings from Last 15 Encounters:  01/23/18 250 lb (113.4 kg)  01/02/18 249 lb 1.6 oz (113 kg)  01/22/17 245 lb (111.1 kg)  12/09/15 229 lb 6.4 oz (104.1 kg)  07/15/15 235 lb (106.6 kg)  02/27/15 250 lb (113.4 kg)  01/03/15 260 lb (117.9 kg)  12/17/14 243 lb (110.2 kg)  03/22/14 240 lb (108.9 kg)  03/13/14 240 lb (108.9 kg)  10/21/13 240 lb (108.9 kg)  05/02/13 230 lb (104.3 kg)  12/17/11 240 lb (108.9 kg)   Spoke with pt who denies any recent weight changes. Pt states his weight can fluctuate between 240-260 lbs. Pt reports that he has been trying to lose weight recently and has noticed his pants fitting more loosely. Pt reports a good appetite and that he typically eats several times a day. Pt denies any nutrition-related concerns.  Body mass index is 40.35 kg/m. Patient meets criteria for Obesity Class III based on current BMI.   Current diet order is Heart Healthy. No meal completion recorded in pt's chart. Labs and medications reviewed.   No nutrition interventions warranted at this time. If nutrition issues arise, please consult RD.   Gaynell Face, MS, RD, LDN Pager: [phone] Weekend/After Hours: [phone]

## 2018-01-24 NOTE — Progress Notes (Addendum)
PROGRESS NOTE  Miguel Marsh IOE:[id number] DOB: [birth year] DOA: 01/23/2018 PCP: [name], DO  Brief History:   57 y.o. male with medical history of metastatic non-small cell lung cancer in remission, hypertension, chronic respiratory failure on 2 L at night, GERD, COPD, tobacco abuse presented with 1 day history of worsening shortness of breath, cough, and white sputum production. The patient continues to smoke 5 cigarettes/day. He has a nearly 80 pack year history.  The patient was recently discharged from the hospital after a stay from 12/31/17 through 01/02/18 for acute on chronic respiratory failure due to COPD exacerbation.  The patient was discharged home with prednisone taper.  He was doing well until the evening of 01/22/2018 when he developed worsening shortness of breath and nonproductive cough with occasional white sputum.   He denies any recent sick contacts.  He used his nebulizers at home without improvement.  EMS was activated.  He was noted to have function saturation of 90% on 2 L.  He was given Solu-Medrol and albuterol by EMS.  He was placed on BiPAP in the emergency department.  He has been since weaned off. In the emergency department, the patient was afebrile hemodynamically stable saturating 96% on 4 L.  BMP and CBC were essentially unremarkable except for WBC of 11.4.  Magnesium was 1.6.  Initial troponin was negative.  EKG showed sinus tachycardia without any ST-T wave changes.  ABG showed 7.3 0/61/84/25 on 40% BiPAP.  Chest x-ray showed a small left effusion without any consolidation.  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     Assessment/Plan: Acute on chronic respiratory failure with hypoxia and hypercarbia -Presence stable on 3 L nasal cannula -Normally only on nighttime oxygen, 2 L and prn during daytime -Wean oxygen for saturation greater than 92%  COPD exacerbation -Continue Pulmicort -Continue IV Solu-Medrol -Continue duo nebs -viral  respiratory panel--neg  Malignancy related pain -Continue home dose of oxycodone -EKG--neg for ischemic changes  Non-small cell lung cancer -Presently in remission -[facility] at [facility] -Diagnosed 06/11/2015  -Received Pembrolizumab9/09/2015-07/16/2017  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, mediastinal mass excision and lymph node dissection with nerve sheath tumor resection 12/04/2017 at [facility] -follow up cardiothoracic at [facility]  Essential hypertension -Holding HCTZ and monitor clinically -BP remains controlled  Hyperglycemia -Check hemoglobin A1c--pending  Morbid obesity -BMI 40.27 -Lifestyle modification  Tobacco abuse -cessation discussed  Hypomagnesemia -replete -check mag    Disposition Plan:   Home 3/29 or 3/30 Family Communication:   Family at bedside  Consultants:  none  Code Status:  FULL  DVT Prophylaxis: [place] Lovenox   Procedures: As Listed in Progress Note Above  Antibiotics: None    Subjective: Patient still feels short of breath particularly with exertion.  He feels that he is only a little bit better than yesterday.  He denies any nausea, vomiting, diarrhea, abdominal pain.  There is no dysuria or hematuria.  He denies any abdominal pain, chest pain, headache, neck pain.  There is no fevers or chills.  Objective: Vitals:   01/23/18 1230 01/23/18 1305 01/23/18 2000 01/23/18 2109  BP: 110/73 108/80 125/64   Pulse: 85 100 90   Resp: (!) 24     Temp:  98.3 F (36.8 C) (!) 97.4 F (36.3 C)   TempSrc:  Oral Oral   SpO2: 93% 92% 96% 92%  Weight:      Height:        Intake/Output Summary (Last  24 hours) at 01/24/2018 0748 Last data filed at 01/23/2018 1600 Gross per 24 hour  Intake -  Output 275 ml  Net -275 ml   Weight change:  Exam:   General:  Pt is alert, follows commands appropriately, not in acute distress  HEENT: No icterus, No thrush, No neck mass, [place]/AT  Cardiovascular: RRR, S1/S2, no rubs, no  gallops  Respiratory: Fine bilateral upper airway wheeze.  Diminished breath sounds at the bases.  Scattered rales.  Abdomen: Soft/+BS, non tender, non distended, no guarding  Extremities: No edema, No lymphangitis, No petechiae, No rashes, no synovitis   Data Reviewed: I have personally reviewed following labs and imaging studies Basic Metabolic Panel: Recent Labs  Lab 01/23/18 0340 01/23/18 0614 01/23/18 0615 01/24/18 0432  NA 137  --   --  139  K 3.9  --   --  4.8  CL 99*  --   --  102  CO2 26  --   --  28  GLUCOSE 150*  --   --  200*  BUN 9  --   --  13  CREATININE 0.84  --   --  0.87  CALCIUM 9.4  --   --  9.3  MG  --  1.6*  --   --   PHOS  --   --  4.0  --    Liver Function Tests: No results for input(s): AST, ALT, ALKPHOS, BILITOT, PROT, ALBUMIN in the last 168 hours. No results for input(s): LIPASE, AMYLASE in the last 168 hours. No results for input(s): AMMONIA in the last 168 hours. Coagulation Profile: No results for input(s): INR, PROTIME in the last 168 hours. CBC: Recent Labs  Lab 01/23/18 0340 01/24/18 0432  WBC 11.4* 15.4*  NEUTROABS 8.2* 13.7*  HGB 16.3 15.2  HCT 50.5 48.4  MCV 92.8 94.3  PLT 287 283   Cardiac Enzymes: Recent Labs  Lab 01/23/18 0340  TROPONINI <0.03   BNP: Invalid input(s): POCBNP CBG: No results for input(s): GLUCAP in the last 168 hours. HbA1C: No results for input(s): HGBA1C in the last 72 hours. Urine analysis:    Component Value Date/Time   COLORURINE YELLOW 01/22/2017 1524   APPEARANCEUR CLOUDY (A) 01/22/2017 1524   LABSPEC 1.021 01/22/2017 1524   PHURINE 7.0 01/22/2017 1524   GLUCOSEU NEGATIVE 01/22/2017 1524   HGBUR NEGATIVE 01/22/2017 1524   BILIRUBINUR NEGATIVE 01/22/2017 1524   KETONESUR NEGATIVE 01/22/2017 1524   PROTEINUR NEGATIVE 01/22/2017 1524   UROBILINOGEN 0.2 10/21/2013 0200   NITRITE NEGATIVE 01/22/2017 1524   LEUKOCYTESUR NEGATIVE 01/22/2017 1524   Sepsis  Labs: @LABRCNTIP (procalcitonin:4,lacticidven:4) ) Recent Results (from the past 240 hour(s))  Respiratory Panel by PCR     Status: None   Collection Time: 01/23/18  9:00 AM  Result Value Ref Range Status   Adenovirus NOT DETECTED NOT DETECTED Final   Coronavirus 229E NOT DETECTED NOT DETECTED Final   Coronavirus HKU1 NOT DETECTED NOT DETECTED Final   Coronavirus NL63 NOT DETECTED NOT DETECTED Final   Coronavirus OC43 NOT DETECTED NOT DETECTED Final   Metapneumovirus NOT DETECTED NOT DETECTED Final   Rhinovirus / Enterovirus NOT DETECTED NOT DETECTED Final   Influenza A NOT DETECTED NOT DETECTED Final   Influenza B NOT DETECTED NOT DETECTED Final   Parainfluenza Virus 1 NOT DETECTED NOT DETECTED Final   Parainfluenza Virus 2 NOT DETECTED NOT DETECTED Final   Parainfluenza Virus 3 NOT DETECTED NOT DETECTED Final   Parainfluenza Virus 4 NOT DETECTED  NOT DETECTED Final   Respiratory Syncytial Virus NOT DETECTED NOT DETECTED Final   Bordetella pertussis NOT DETECTED NOT DETECTED Final   Chlamydophila pneumoniae NOT DETECTED NOT DETECTED Final   Mycoplasma pneumoniae NOT DETECTED NOT DETECTED Final    Comment: Performed at [facility], [place] [address] [place], [place] [address]     Scheduled Meds: . budesonide (PULMICORT) nebulizer solution  0.5 mg Nebulization BID  . enoxaparin (LOVENOX) injection  0.5 mg/kg Subcutaneous Q24H  . ipratropium-albuterol  3 mL Nebulization Q6H  . mouth rinse  15 mL Mouth Rinse BID  . methylPREDNISolone (SOLU-MEDROL) injection  60 mg Intravenous Q6H  . pantoprazole  80 mg Oral Daily   Continuous Infusions:  Procedures/Studies: Dg Chest 2 View  Result Date: 12/31/2017 CLINICAL DATA:  Worsening shortness of breath. 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 EXAM: CHEST  2 VIEW COMPARISON:  12/09/2015 FINDINGS: The cardiomediastinal silhouette is unchanged and within normal limits. There are chronic bronchitic changes. The  left lung is better inflated than on the prior study with minimal scarring noted in the left lung base. Triangular left basilar opacity on the prior study is no longer readily apparent. There is a small right pleural effusion. No pneumothorax is identified. No acute osseous abnormality is seen. IMPRESSION: 1. New small right pleural effusion. 2. Chronic bronchitic changes. 3. Improved aeration of the left lung base. Electronically Signed   By: Logan Bores M.D.   On: 12/31/2017 12:46   Dg Chest Portable 1 View  Result Date: 01/23/2018 CLINICAL DATA:  57 y/o M; worsening shortness of breath. Metastatic lung cancer. EXAM: PORTABLE CHEST 1 VIEW COMPARISON:  12/31/2017 chest radiograph FINDINGS: Stable cardiac silhouette given projection and technique. Right mid lung zone platelike atelectasis or scarring. Blunting of right costal diaphragmatic angle, probable small lesion is stable. Pulmonary venous hypertension. Postsurgical changes in left paratracheal region. Bones are unremarkable. IMPRESSION: 1. Stable small left effusion.  No consolidation. 2. Pulmonary venous hypertension. Electronically Signed   By: Kristine Garbe M.D.   On: 01/23/2018 04:15    [name], DO  [facility] Pager [phone]  If 7PM-7AM, please contact night-coverage [email or web address] Password [facility] 01/24/2018, 7:48 AM   LOS: 1 day

## 2018-01-25 ENCOUNTER — Inpatient Hospital Stay (HOSPITAL_COMMUNITY): Payer: Medicare HMO

## 2018-01-25 ENCOUNTER — Encounter (HOSPITAL_COMMUNITY): Payer: Self-pay | Admitting: *Deleted

## 2018-01-25 LAB — BASIC METABOLIC PANEL
ANION GAP: 11 (ref 5–15)
BUN: 15 mg/dL (ref 6–20)
CHLORIDE: 98 mmol/L — AB (ref 101–111)
CO2: 30 mmol/L (ref 22–32)
CREATININE: 0.84 mg/dL (ref 0.61–1.24)
Calcium: 9.4 mg/dL (ref 8.9–10.3)
GFR calc non Af Amer: >60 mL/min (ref >60)
Glucose, Bld: 260 mg/dL — ABNORMAL HIGH (ref 65–99)
Potassium: 4.6 mmol/L (ref 3.5–5.1)
SODIUM: 139 mmol/L (ref 135–145)

## 2018-01-25 LAB — MAGNESIUM: MAGNESIUM: 2 mg/dL (ref 1.7–2.4)

## 2018-01-25 MED ORDER — MORPHINE SULFATE (PF) 2 MG/ML IV SOLN
2.0000 mg | Freq: Once | INTRAVENOUS | Status: AC
  Administered 2018-01-25: 2 mg via INTRAVENOUS
  Filled 2018-01-25: qty 1

## 2018-01-25 MED ORDER — METHYLPREDNISOLONE SODIUM SUCC 125 MG IJ SOLR
60.0000 mg | Freq: Two times a day (BID) | INTRAMUSCULAR | Status: DC
  Administered 2018-01-25 – 2018-01-28 (×6): 60 mg via INTRAVENOUS
  Filled 2018-01-25 (×6): qty 2

## 2018-01-25 MED ORDER — IOPAMIDOL (ISOVUE-300) INJECTION 61%
30.0000 mL | Freq: Once | INTRAVENOUS | Status: DC | PRN
  Administered 2018-01-25: 30 mL via ORAL
  Filled 2018-01-25: qty 30

## 2018-01-25 MED ORDER — IOPAMIDOL (ISOVUE-300) INJECTION 61%
100.0000 mL | Freq: Once | INTRAVENOUS | Status: AC | PRN
  Administered 2018-01-25: 100 mL via INTRAVENOUS

## 2018-01-25 MED ORDER — HYDROCODONE-HOMATROPINE 5-1.5 MG/5ML PO SYRP
5.0000 mL | ORAL_SOLUTION | ORAL | Status: DC | PRN
  Administered 2018-01-25 – 2018-01-28 (×12): 5 mL via ORAL
  Filled 2018-01-25 (×12): qty 5

## 2018-01-25 NOTE — Care Management Important Message (Signed)
Important Message  Patient Details  Name: ANTARIO YASUDA MRN: [id number] Date of Birth: [birth year]   Medicare Important Message Given:  Yes    Shelda Altes 01/25/2018, 11:41 AM

## 2018-01-25 NOTE — Progress Notes (Signed)
PROGRESS NOTE  Miguel Marsh FTD:[id number] DOB: 12/19/1960 DOA: 01/23/2018 PCP: [name], DO   Brief History:  56 y.o.malewith medical history ofmetastatic non-small cell lung cancer in remission, hypertension, chronic respiratory failure on 2 L at night, GERD, COPD, tobacco abuse presented with 1 day history of worsening shortness of breath, cough, and white sputum production. The patient continues to smoke 5 cigarettes/day. He has a nearly 80 pack year history.The patient was recently discharged from the hospital after a stay from 12/31/17 through 01/02/18 for acute on chronic respiratory failure due to COPD exacerbation. The patient was discharged home with prednisone taper. He was doing well until the evening of 01/22/2018 when he developed worsening shortness of breath and nonproductive cough with occasional white sputum. He denies any recent sick contacts. He used his nebulizers at home without improvement. EMS was activated. He was noted to have function saturation of 90% on 2 L. He was given Solu-Medrol and albuterol by EMS. He was placed on BiPAP in the emergency department. He has been since weaned off. In the emergency department, the patient was afebrile hemodynamically stable saturating 96% on 4 L. BMP and CBC were essentially unremarkable except for WBC of 11.4. Magnesium was 1.6. Initial troponin was negative. EKG showed sinus tachycardia without any ST-T wave changes. ABG showed 7.3 0/61/84/25 on 40% BiPAP. Chest x-ray showed a small left effusion without any consolidation. 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    Assessment/Plan: Acute on chronic respiratory failure with hypoxiaand hypercarbia -Presence stable on3L nasal cannula -Normally only on nighttime oxygen, 2 L and prn during daytime -Wean oxygen for saturation greater than 92%  COPD exacerbation -Continue Pulmicort -had more wheezing and dyspnea when weaned to  prednisone -3.29--restart IV solumedrol q 12hr -Continue duo nebs -viral respiratory panel--neg  Malignancy related pain -Continue home dose of oxycodone -EKG--neg for ischemic changes -pt has had chronic Right lower chest, RUQ pain since his surgery 12/04/17 -01/25/18--worsen R-sided pain-->order CT abd although suspect due to coughing-->muscle strain -hycodan for cough  Non-small cell lung cancer -Presently in remission -[facility] at [facility] -Diagnosed 06/11/2015  -Received Pembrolizumab9/09/2015-07/16/2017  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, mediastinal mass excision and lymph node dissection with nerve sheath tumor resection 12/04/2017 at [facility] -follow up cardiothoracic at [facility]  Essential hypertension -HoldingHCTZ and monitor clinically -BP remains controlled  Medication induced Hyperglycemia -01/24/18 hemoglobin A1c--6.1 -start novolog ISS  Morbid obesity -BMI 40.27 -Lifestyle modification  Tobacco abuse -cessation discussed  Hypomagnesemia -repleted -check mag--2.0    Disposition Plan:   Home 3/30 if CT neg and resp status improved Family Communication:   Family at bedside  Consultants:  none  Code Status:  FULL  DVT Prophylaxis: [place] Lovenox   Procedures: As Listed in Progress Note Above  Antibiotics: None        Subjective: Patient states that his overall breathing is better, but he is more short of breath today.  He denies any nausea, vomiting, diarrhea, dysuria, hematuria.  He complains of worsening right upper quadrant pain today.  He was able to tolerate his lunch and breakfast.  There is no hematemesis, hematochezia, melena.  There is no fever or chills.  He continues to have a vigorous nonproductive cough.  Objective: Vitals:   01/25/18 0626 01/25/18 1306 01/25/18 1405 01/25/18 1539  BP: 121/67 124/73  119/64  Pulse: 94 91  89  Resp: 14 15  17   Temp: 98.4 F (36.9 C) 97.7  F (36.5 C)  97.8 F  (36.6 C)  TempSrc: Oral Oral  Oral  SpO2: 95% 95% 93% 96%  Weight:      Height:        Intake/Output Summary (Last 24 hours) at 01/25/2018 1623 Last data filed at 01/25/2018 1512 Gross per 24 hour  Intake 600 ml  Output 550 ml  Net 50 ml   Weight change:  Exam:   General:  Pt is alert, follows commands appropriately, not in acute distress  HEENT: No icterus, No thrush, No neck mass, [place]/AT  Cardiovascular: RRR, S1/S2, no rubs, no gallops  Respiratory: Bilateral scattered rales.  Bibasilar wheezing.  Good air movement.  Abdomen: Soft/+BS, RUQ tender, non distended, no guarding  Extremities: No edema, No lymphangitis, No petechiae, No rashes, no synovitis   Data Reviewed: I have personally reviewed following labs and imaging studies Basic Metabolic Panel: Recent Labs  Lab 01/23/18 0340 01/23/18 7654 01/23/18 0615 01/24/18 0432 01/24/18 0740 01/25/18 0611  NA 137  --   --  139  --  139  K 3.9  --   --  4.8  --  4.6  CL 99*  --   --  102  --  98*  CO2 26  --   --  28  --  30  GLUCOSE 150*  --   --  200*  --  260*  BUN 9  --   --  13  --  15  CREATININE 0.84  --   --  0.87  --  0.84  CALCIUM 9.4  --   --  9.3  --  9.4  MG  --  1.6*  --   --  2.1 2.0  PHOS  --   --  4.0  --   --   --    Liver Function Tests: No results for input(s): AST, ALT, ALKPHOS, BILITOT, PROT, ALBUMIN in the last 168 hours. No results for input(s): LIPASE, AMYLASE in the last 168 hours. No results for input(s): AMMONIA in the last 168 hours. Coagulation Profile: No results for input(s): INR, PROTIME in the last 168 hours. CBC: Recent Labs  Lab 01/23/18 0340 01/24/18 0432  WBC 11.4* 15.4*  NEUTROABS 8.2* 13.7*  HGB 16.3 15.2  HCT 50.5 48.4  MCV 92.8 94.3  PLT 287 283   Cardiac Enzymes: Recent Labs  Lab 01/23/18 0340  TROPONINI <0.03   BNP: Invalid input(s): POCBNP CBG: No results for input(s): GLUCAP in the last 168 hours. HbA1C: Recent Labs    01/24/18 0432  HGBA1C  6.1*   Urine analysis:    Component Value Date/Time   COLORURINE YELLOW 01/22/2017 1524   APPEARANCEUR CLOUDY (A) 01/22/2017 1524   LABSPEC 1.021 01/22/2017 1524   PHURINE 7.0 01/22/2017 1524   GLUCOSEU NEGATIVE 01/22/2017 1524   HGBUR NEGATIVE 01/22/2017 1524   BILIRUBINUR NEGATIVE 01/22/2017 1524   KETONESUR NEGATIVE 01/22/2017 1524   PROTEINUR NEGATIVE 01/22/2017 1524   UROBILINOGEN 0.2 10/21/2013 0200   NITRITE NEGATIVE 01/22/2017 1524   LEUKOCYTESUR NEGATIVE 01/22/2017 1524   Sepsis Labs: @LABRCNTIP (procalcitonin:4,lacticidven:4) ) Recent Results (from the past 240 hour(s))  Respiratory Panel by PCR     Status: None   Collection Time: 01/23/18  9:00 AM  Result Value Ref Range Status   Adenovirus NOT DETECTED NOT DETECTED Final   Coronavirus 229E NOT DETECTED NOT DETECTED Final   Coronavirus HKU1 NOT DETECTED NOT DETECTED Final   Coronavirus NL63 NOT DETECTED NOT DETECTED Final  Coronavirus OC43 NOT DETECTED NOT DETECTED Final   Metapneumovirus NOT DETECTED NOT DETECTED Final   Rhinovirus / Enterovirus NOT DETECTED NOT DETECTED Final   Influenza A NOT DETECTED NOT DETECTED Final   Influenza B NOT DETECTED NOT DETECTED Final   Parainfluenza Virus 1 NOT DETECTED NOT DETECTED Final   Parainfluenza Virus 2 NOT DETECTED NOT DETECTED Final   Parainfluenza Virus 3 NOT DETECTED NOT DETECTED Final   Parainfluenza Virus 4 NOT DETECTED NOT DETECTED Final   Respiratory Syncytial Virus NOT DETECTED NOT DETECTED Final   Bordetella pertussis NOT DETECTED NOT DETECTED Final   Chlamydophila pneumoniae NOT DETECTED NOT DETECTED Final   Mycoplasma pneumoniae NOT DETECTED NOT DETECTED Final    Comment: Performed at [facility], [place] [address], [place], [place] [address]     Scheduled Meds: . budesonide (PULMICORT) nebulizer solution  0.5 mg Nebulization BID  . enoxaparin (LOVENOX) injection  0.5 mg/kg Subcutaneous Q24H  . ipratropium-albuterol  3 mL Nebulization Q6H  . mouth  rinse  15 mL Mouth Rinse BID  . methylPREDNISolone (SOLU-MEDROL) injection  60 mg Intravenous Q12H  . pantoprazole  80 mg Oral Daily   Continuous Infusions:  Procedures/Studies: Dg Chest 2 View  Result Date: 12/31/2017 CLINICAL DATA:  Worsening shortness of breath. 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 EXAM: CHEST  2 VIEW COMPARISON:  12/09/2015 FINDINGS: The cardiomediastinal silhouette is unchanged and within normal limits. There are chronic bronchitic changes. The left lung is better inflated than on the prior study with minimal scarring noted in the left lung base. Triangular left basilar opacity on the prior study is no longer readily apparent. There is a small right pleural effusion. No pneumothorax is identified. No acute osseous abnormality is seen. IMPRESSION: 1. New small right pleural effusion. 2. Chronic bronchitic changes. 3. Improved aeration of the left lung base. Electronically Signed   By: Logan Bores M.D.   On: 12/31/2017 12:46   Dg Chest Portable 1 View  Result Date: 01/23/2018 CLINICAL DATA:  57 y/o M; worsening shortness of breath. Metastatic lung cancer. EXAM: PORTABLE CHEST 1 VIEW COMPARISON:  12/31/2017 chest radiograph FINDINGS: Stable cardiac silhouette given projection and technique. Right mid lung zone platelike atelectasis or scarring. Blunting of right costal diaphragmatic angle, probable small lesion is stable. Pulmonary venous hypertension. Postsurgical changes in left paratracheal region. Bones are unremarkable. IMPRESSION: 1. Stable small left effusion.  No consolidation. 2. Pulmonary venous hypertension. Electronically Signed   By: Kristine Garbe M.D.   On: 01/23/2018 04:15    [name], DO  [facility] Pager [phone]  If 7PM-7AM, please contact night-coverage [email or web address] Password [email or web address] 01/25/2018, 4:23 PM   LOS: 2 days

## 2018-01-26 ENCOUNTER — Encounter (HOSPITAL_COMMUNITY): Payer: Self-pay

## 2018-01-26 LAB — COMPREHENSIVE METABOLIC PANEL
ALK PHOS: 69 U/L (ref 38–126)
ALT: 20 U/L (ref 17–63)
ANION GAP: 12 (ref 5–15)
AST: 13 U/L — ABNORMAL LOW (ref 15–41)
Albumin: 3.6 g/dL (ref 3.5–5.0)
BUN: 14 mg/dL (ref 6–20)
CALCIUM: 9.2 mg/dL (ref 8.9–10.3)
CO2: 28 mmol/L (ref 22–32)
Chloride: 97 mmol/L — ABNORMAL LOW (ref 101–111)
Creatinine, Ser: 0.81 mg/dL (ref 0.61–1.24)
GFR calc non Af Amer: >60 mL/min (ref >60)
Glucose, Bld: 105 mg/dL — ABNORMAL HIGH (ref 65–99)
Potassium: 4.2 mmol/L (ref 3.5–5.1)
SODIUM: 137 mmol/L (ref 135–145)
Total Bilirubin: 0.5 mg/dL (ref 0.3–1.2)
Total Protein: 6.9 g/dL (ref 6.5–8.1)

## 2018-01-26 LAB — CBC WITH DIFFERENTIAL/PLATELET
Basophils Absolute: 0 10*3/uL (ref 0.0–0.1)
Basophils Relative: 0 %
EOS ABS: 0 10*3/uL (ref 0.0–0.7)
EOS PCT: 0 %
HCT: 48.4 % (ref 39.0–52.0)
HEMOGLOBIN: 15.2 g/dL (ref 13.0–17.0)
LYMPHS ABS: 1.9 10*3/uL (ref 0.7–4.0)
Lymphocytes Relative: 12 %
MCH: 29.3 pg (ref 26.0–34.0)
MCHC: 31.4 g/dL (ref 30.0–36.0)
MCV: 93.3 fL (ref 78.0–100.0)
MONO ABS: 1.8 10*3/uL — AB (ref 0.1–1.0)
MONOS PCT: 11 %
Neutro Abs: 12.3 10*3/uL — ABNORMAL HIGH (ref 1.7–7.7)
Neutrophils Relative %: 77 %
PLATELETS: 263 10*3/uL (ref 150–400)
RBC: 5.19 MIL/uL (ref 4.22–5.81)
RDW: 13.4 % (ref 11.5–15.5)
WBC: 16.1 10*3/uL — ABNORMAL HIGH (ref 4.0–10.5)

## 2018-01-26 MED ORDER — POLYETHYLENE GLYCOL 3350 17 G PO PACK
17.0000 g | PACK | Freq: Every day | ORAL | Status: DC
  Administered 2018-01-26 – 2018-01-28 (×3): 17 g via ORAL
  Filled 2018-01-26 (×3): qty 1

## 2018-01-26 MED ORDER — GUAIFENESIN ER 600 MG PO TB12
600.0000 mg | ORAL_TABLET | Freq: Two times a day (BID) | ORAL | Status: DC
  Administered 2018-01-26 – 2018-01-28 (×5): 600 mg via ORAL
  Filled 2018-01-26 (×5): qty 1

## 2018-01-26 MED ORDER — CYCLOBENZAPRINE HCL 10 MG PO TABS
5.0000 mg | ORAL_TABLET | Freq: Three times a day (TID) | ORAL | Status: DC | PRN
  Administered 2018-01-26 – 2018-01-27 (×4): 5 mg via ORAL
  Filled 2018-01-26 (×4): qty 1

## 2018-01-26 MED ORDER — NICOTINE 14 MG/24HR TD PT24
14.0000 mg | MEDICATED_PATCH | Freq: Every day | TRANSDERMAL | Status: DC
  Administered 2018-01-26 – 2018-01-28 (×3): 14 mg via TRANSDERMAL
  Filled 2018-01-26 (×3): qty 1

## 2018-01-26 MED ORDER — ALUM & MAG HYDROXIDE-SIMETH 200-200-20 MG/5ML PO SUSP
30.0000 mL | Freq: Four times a day (QID) | ORAL | Status: DC | PRN
  Administered 2018-01-26 – 2018-01-27 (×2): 30 mL via ORAL
  Filled 2018-01-26 (×2): qty 30

## 2018-01-26 NOTE — Progress Notes (Signed)
Patient is currently on [facility] with sats of 97%. Patient is in no distress and all vitals are stable. BIPAP is not needed at this time. Will continue to monitor.

## 2018-01-26 NOTE — Progress Notes (Signed)
PROGRESS NOTE  Miguel Marsh TGG:[id number] DOB: 1961/07/05 DOA: 01/23/2018 PCP: [name], DO   Brief History:  57 y.o.malewith medical history ofmetastatic non-small cell lung cancer in remission, hypertension, chronic respiratory failure on 2 L at night, GERD, COPD, tobacco abuse presented with 1 day history of worsening shortness of breath, cough, and white sputum production. The patient continues to smoke 5 cigarettes/day. He has a nearly 80 pack year history.The patient was recently discharged from the hospital after a stay from 12/31/17 through 01/02/18 for acute on chronic respiratory failure due to COPD exacerbation. The patient was discharged home with prednisone taper. He was doing well until the evening of 01/22/2018 when he developed worsening shortness of breath and nonproductive cough with occasional white sputum. He denies any recent sick contacts. He used his nebulizers at home without improvement. EMS was activated. He was noted to have function saturation of 90% on 2 L. He was given Solu-Medrol and albuterol by EMS. He was placed on BiPAP in the emergency department. He has been since weaned off. In the emergency department, the patient was afebrile hemodynamically stable saturating 96% on 4 L. BMP and CBC were essentially unremarkable except for WBC of 11.4. Magnesium was 1.6. Initial troponin was negative. EKG showed sinus tachycardia without any ST-T wave changes. ABG showed 7.3 0/61/84/25 on 40% BiPAP. Chest x-ray showed a small left effusion without any consolidation. 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    Assessment/Plan: Acute on chronic respiratory failure with hypoxiaand hypercarbia -Presence stable on3L nasal cannula -Normally only on nighttime oxygen, 2 L and prn during daytime -Wean oxygen for saturation greater than 92%  COPD exacerbation -Continue Pulmicort -had more wheezing and dyspnea when weaned to  prednisone -3.29--restart IV solumedrol q 12hr -Continue duo nebs -viral respiratory panel--neg -remain wheezing, less cough, add mucinex, continue current dose of iv steroids, need to slow taper steroids,  Malignancy related pain -Continue home dose of oxycodone -EKG--neg for ischemic changes -pt has had chronic Right lower chest, RUQ pain since his surgery 12/04/17 -01/25/18--worsen R-sided pain-->order CT abd although suspect due to coughing-->muscle strain -hycodan for cough  Non-small cell lung cancer -Presently in remission -[facility]Med Onc at [facility] -Diagnosed 06/11/2015  -Received Pembrolizumab9/09/2015-07/16/2017  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, mediastinal mass excision and lymph node dissection with nerve sheath tumor resection 12/04/2017 at [facility] -follow up cardiothoracic at [facility]  Essential hypertension -HoldingHCTZ and monitor clinically -BP remains controlled  Medication induced Hyperglycemia -01/24/18 hemoglobin A1c--6.1 -start novolog ISS  Morbid obesity -BMI 40.27 -Lifestyle modification  Tobacco abuse -cessation discussed, nicotine patch provided  Hypomagnesemia -repleted -check mag--2.0  Constipation; add MiraLAX ,continue Senokot    Disposition Plan:   Home 3/31 if resp status improved Family Communication:   patient   Consultants:  none  Code Status:  FULL  DVT Prophylaxis: [place] Lovenox   Procedures: As Listed in Progress Note Above  Antibiotics: None   Subjective:  Report right sided rib pain worse with movement or cough Still wheeze, but report is improving , No fever, no edema He is sitting up in chair, eat all his breakfast this am He is asking for nicotin patch  Objective: Vitals:   01/25/18 1935 01/25/18 2101 01/26/18 0135 01/26/18 0500  BP:  130/64  127/66  Pulse: 82 84  71  Resp: 20 (!) 24    Temp:  98.1 F (36.7 C)  97.9 F (36.6 C)  TempSrc:  Oral  Oral  SpO2: 93% 97% 95% 96%   Weight:      Height:        Intake/Output Summary (Last 24 hours) at 01/26/2018 0818 Last data filed at 01/25/2018 1512 Gross per 24 hour  Intake 960 ml  Output 550 ml  Net 410 ml   Weight change:  Exam:   General:  Pt is alert, follows commands appropriately, not in acute distress  HEENT: No icterus, No thrush, No neck mass, [place]/AT  Cardiovascular: RRR, S1/S2, no rubs, no gallops  Respiratory: Bilateral scattered rales.  Bibasilar wheezing.  Good air movement.  Abdomen: Soft/+BS, RUQ tender, non distended, no guarding  Extremities: No edema, No lymphangitis, No petechiae, No rashes, no synovitis   Data Reviewed: I have personally reviewed following labs and imaging studies Basic Metabolic Panel: Recent Labs  Lab 01/23/18 0340 01/23/18 3810 01/23/18 0615 01/24/18 0432 01/24/18 0740 01/25/18 0611  NA 137  --   --  139  --  139  K 3.9  --   --  4.8  --  4.6  CL 99*  --   --  102  --  98*  CO2 26  --   --  28  --  30  GLUCOSE 150*  --   --  200*  --  260*  BUN 9  --   --  13  --  15  CREATININE 0.84  --   --  0.87  --  0.84  CALCIUM 9.4  --   --  9.3  --  9.4  MG  --  1.6*  --   --  2.1 2.0  PHOS  --   --  4.0  --   --   --    Liver Function Tests: No results for input(s): AST, ALT, ALKPHOS, BILITOT, PROT, ALBUMIN in the last 168 hours. No results for input(s): LIPASE, AMYLASE in the last 168 hours. No results for input(s): AMMONIA in the last 168 hours. Coagulation Profile: No results for input(s): INR, PROTIME in the last 168 hours. CBC: Recent Labs  Lab 01/23/18 0340 01/24/18 0432 01/26/18 0614  WBC 11.4* 15.4* 16.1*  NEUTROABS 8.2* 13.7* 12.3*  HGB 16.3 15.2 15.2  HCT 50.5 48.4 48.4  MCV 92.8 94.3 93.3  PLT 287 283 263   Cardiac Enzymes: Recent Labs  Lab 01/23/18 0340  TROPONINI <0.03   BNP: Invalid input(s): POCBNP CBG: No results for input(s): GLUCAP in the last 168 hours. HbA1C: Recent Labs    01/24/18 0432  HGBA1C 6.1*   Urine  analysis:    Component Value Date/Time   COLORURINE YELLOW 01/22/2017 1524   APPEARANCEUR CLOUDY (A) 01/22/2017 1524   LABSPEC 1.021 01/22/2017 1524   PHURINE 7.0 01/22/2017 1524   GLUCOSEU NEGATIVE 01/22/2017 1524   HGBUR NEGATIVE 01/22/2017 1524   BILIRUBINUR NEGATIVE 01/22/2017 1524   KETONESUR NEGATIVE 01/22/2017 1524   PROTEINUR NEGATIVE 01/22/2017 1524   UROBILINOGEN 0.2 10/21/2013 0200   NITRITE NEGATIVE 01/22/2017 1524   LEUKOCYTESUR NEGATIVE 01/22/2017 1524   Sepsis Labs: @LABRCNTIP (procalcitonin:4,lacticidven:4) ) Recent Results (from the past 240 hour(s))  Respiratory Panel by PCR     Status: None   Collection Time: 01/23/18  9:00 AM  Result Value Ref Range Status   Adenovirus NOT DETECTED NOT DETECTED Final   Coronavirus 229E NOT DETECTED NOT DETECTED Final   Coronavirus HKU1 NOT DETECTED NOT DETECTED Final   Coronavirus NL63 NOT DETECTED NOT DETECTED Final   Coronavirus OC43 NOT DETECTED NOT  DETECTED Final   Metapneumovirus NOT DETECTED NOT DETECTED Final   Rhinovirus / Enterovirus NOT DETECTED NOT DETECTED Final   Influenza A NOT DETECTED NOT DETECTED Final   Influenza B NOT DETECTED NOT DETECTED Final   Parainfluenza Virus 1 NOT DETECTED NOT DETECTED Final   Parainfluenza Virus 2 NOT DETECTED NOT DETECTED Final   Parainfluenza Virus 3 NOT DETECTED NOT DETECTED Final   Parainfluenza Virus 4 NOT DETECTED NOT DETECTED Final   Respiratory Syncytial Virus NOT DETECTED NOT DETECTED Final   Bordetella pertussis NOT DETECTED NOT DETECTED Final   Chlamydophila pneumoniae NOT DETECTED NOT DETECTED Final   Mycoplasma pneumoniae NOT DETECTED NOT DETECTED Final    Comment: Performed at [facility], [place] [address] [place], [place] [address]     Scheduled Meds: . budesonide (PULMICORT) nebulizer solution  0.5 mg Nebulization BID  . enoxaparin (LOVENOX) injection  0.5 mg/kg Subcutaneous Q24H  . guaiFENesin  600 mg Oral BID  . ipratropium-albuterol  3 mL  Nebulization Q6H  . mouth rinse  15 mL Mouth Rinse BID  . methylPREDNISolone (SOLU-MEDROL) injection  60 mg Intravenous Q12H  . pantoprazole  80 mg Oral Daily   Continuous Infusions:  Procedures/Studies: Dg Chest 2 View  Result Date: 12/31/2017 CLINICAL DATA:  Worsening shortness of breath. 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 EXAM: CHEST  2 VIEW COMPARISON:  12/09/2015 FINDINGS: The cardiomediastinal silhouette is unchanged and within normal limits. There are chronic bronchitic changes. The left lung is better inflated than on the prior study with minimal scarring noted in the left lung base. Triangular left basilar opacity on the prior study is no longer readily apparent. There is a small right pleural effusion. No pneumothorax is identified. No acute osseous abnormality is seen. IMPRESSION: 1. New small right pleural effusion. 2. Chronic bronchitic changes. 3. Improved aeration of the left lung base. Electronically Signed   By: Logan Bores M.D.   On: 12/31/2017 12:46   Ct Abdomen Pelvis W Contrast  Result Date: 01/26/2018 CLINICAL DATA:  Abdominal pain. History treated metastatic non-small EXAM: CT ABDOMEN AND PELVIS WITH CONTRAST TECHNIQUE: Multidetector CT imaging of the abdomen and pelvis was performed using the standard protocol following bolus administration of intravenous contrast. CONTRAST:  119mL ISOVUE-300 IOPAMIDOL (ISOVUE-300) INJECTION 61%, 70mL ISOVUE-300 IOPAMIDOL (ISOVUE-300) INJECTION 61% COMPARISON:  12/23/ FINDINGS: Lower chest: There is a small nonspecific pulmonary nodule in the right lower lobe measuring 4 mm, image 1/4. Hepatobiliary: Mild hepatic steatosis. No focal liver abnormality. Small stones noted within the neck of gallbladder measuring around 3 mm. No gallbladder wall thickening. No biliary dilatation. Pancreas: Unremarkable. No pancreatic ductal dilatation or surrounding inflammatory changes. Spleen: Normal in size without focal  abnormality. Adrenals/Urinary Tract: The adrenal glands appear normal. Unremarkable appearance of the right kidney. Left kidney cysts noted. No mass or hydronephrosis identified. Urinary bladder appears normal. Stomach/Bowel: The stomach appears normal. The small bowel loops have a normal course and caliber. Normal appearance of the appendix. Unremarkable appearance of the colon. Vascular/Lymphatic: Aortic atherosclerosis. No aneurysm. No enlarged retroperitoneal or mesenteric adenopathy. No enlarged pelvic or inguinal lymph nodes. Reproductive: Prostate is unremarkable. Other: Fat containing periumbilical hernia identified. 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 Previous right hip arthroplasty. IMPRESSION: 1. No acute findings within the abdomen or pelvis. 2. Nonspecific 4 mm right lower lobe lung nodule is new from previous exam. No follow-up needed if patient is low-risk. Non-contrast chest CT can be considered  in 12 months if patient is high-risk. This recommendation follows the consensus statement: Guidelines for Management of Incidental Pulmonary Nodules Detected on CT Images: From the Fleischner Society 2017; Radiology 2017; 284:228-243. 3.  Aortic Atherosclerosis (ICD10-I70.0). 4. Gallstones. 5. Hepatic steatosis. Electronically Signed   By: Kerby Moors M.D.   On: 01/26/2018 07:18   Dg Chest Portable 1 View  Result Date: 01/23/2018 CLINICAL DATA:  57 y/o M; worsening shortness of breath. Metastatic lung cancer. EXAM: PORTABLE CHEST 1 VIEW COMPARISON:  12/31/2017 chest radiograph FINDINGS: Stable cardiac silhouette given projection and technique. Right mid lung zone platelike atelectasis or scarring. Blunting of right costal diaphragmatic angle, probable small lesion is stable. Pulmonary venous hypertension. Postsurgical changes in left paratracheal region. Bones are unremarkable. IMPRESSION: 1. Stable small left effusion.  No consolidation. 2. Pulmonary venous  hypertension. Electronically Signed   By: Kristine Garbe M.D.   On: 01/23/2018 04:15    Florencia Reasons, MD PhD  [facility] Pager [phone]  If 7PM-7AM, please contact night-coverage [email or web address] Password [email or web address] 01/26/2018, 8:18 AM   LOS: 3 days

## 2018-01-27 ENCOUNTER — Encounter (HOSPITAL_COMMUNITY): Payer: Self-pay

## 2018-01-27 MED ORDER — SENNOSIDES-DOCUSATE SODIUM 8.6-50 MG PO TABS
1.0000 | ORAL_TABLET | Freq: Two times a day (BID) | ORAL | Status: DC
  Administered 2018-01-27 – 2018-01-28 (×3): 1 via ORAL
  Filled 2018-01-27 (×3): qty 1

## 2018-01-27 NOTE — Progress Notes (Signed)
SATURATION QUALIFICATIONS: (This note is used to comply with regulatory documentation for home oxygen)  Patient Saturations on Room Air at Rest = 95%  Patient Saturations on Room Air while Ambulating = 88%  Patient Saturations on 2 Liters of oxygen while Ambulating = 93%  Please briefly explain why patient needs home oxygen: patient desats while ambulating on room air, requires oxygen at 2 liters/min to maintain o2 sat above 90% when ambulating.

## 2018-01-27 NOTE — Progress Notes (Signed)
PROGRESS NOTE  Miguel Marsh MHD:[id number] DOB: 1960/11/14 DOA: 01/23/2018 PCP: [name], DO   Brief History:  56 y.o.malewith medical history ofmetastatic non-small cell lung cancer in remission, hypertension, chronic respiratory failure on 2 L at night, GERD, COPD, tobacco abuse presented with 1 day history of worsening shortness of breath, cough, and white sputum production. The patient continues to smoke 5 cigarettes/day. He has a nearly 80 pack year history.The patient was recently discharged from the hospital after a stay from 12/31/17 through 01/02/18 for acute on chronic respiratory failure due to COPD exacerbation. The patient was discharged home with prednisone taper. He was doing well until the evening of 01/22/2018 when he developed worsening shortness of breath and nonproductive cough with occasional white sputum. He denies any recent sick contacts. He used his nebulizers at home without improvement. EMS was activated. He was noted to have function saturation of 90% on 2 L. He was given Solu-Medrol and albuterol by EMS. He was placed on BiPAP in the emergency department. He has been since weaned off. In the emergency department, the patient was afebrile hemodynamically stable saturating 96% on 4 L. BMP and CBC were essentially unremarkable except for WBC of 11.4. Magnesium was 1.6. Initial troponin was negative. EKG showed sinus tachycardia without any ST-T wave changes. ABG showed 7.3 0/61/84/25 on 40% BiPAP. Chest x-ray showed a small left effusion without any consolidation. 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    Assessment/Plan: Acute on chronic respiratory failure with hypoxiaand hypercarbia -Presence stable on3L nasal cannula -Normally only on nighttime oxygen, 2 L and prn during daytime -Wean oxygen for saturation greater than 92%  COPD exacerbation -Continue Pulmicort -had more wheezing and dyspnea when weaned to  prednisone -3.29--restart IV solumedrol q 12hr -Continue duo nebs -viral respiratory panel--neg -less wheezing, less cough, continuemucinex, continue current dose of iv steroids,  -likely able to taper steroids and d/c home tomorrow  Malignancy related pain -Continue home dose of oxycodone -EKG--neg for ischemic changes -pt has had chronic Right lower chest, RUQ pain since his surgery 12/04/17 -01/25/18--worsen R-sided pain-->CT abd no acute findings ( does has gallstone/hepatosteatosis) -suspect worsening of right side pain due to coughing-->muscle strain -hycodan for cough -improved  Non-small cell lung cancer -Presently in remission -[facility] -Diagnosed 06/11/2015  -Received Pembrolizumab9/09/2015-07/16/2017 -Ct scan from 3/29 showed new 62mm right lower lobe lung nodule, need to follow up with oncology closely. 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, mediastinal mass excision and lymph node dissection with nerve sheath tumor resection 12/04/2017 at [facility] -follow up cardiothoracic at [facility]  Essential hypertension -HoldingHCTZ and monitor clinically -BP remains controlled  Medication induced Hyperglycemia -01/24/18 hemoglobin A1c--6.1 -start novolog ISS  Morbid obesity -BMI 40.27 -Lifestyle modification  Tobacco abuse -cessation discussed, nicotine patch provided  Hypomagnesemia -repleted -check mag--2.0  Constipation; add MiraLAX ,continue Senokot    Disposition Plan:   Home 4/1 if resp status improved Family Communication:   patient   Consultants:  none  Code Status:  FULL  DVT Prophylaxis: [place] Lovenox   Procedures: As Listed in Progress Note Above  Antibiotics: None   Subjective:  Report right sided rib pain has improved, less cough, less wheezing, remain dyspnea on exertion No fever, no edema   Objective: Vitals:   01/26/18 2202 01/27/18  0121 01/27/18 0520 01/27/18 0728  BP: 121/72  131/69   Pulse: 82  86  Resp: 20  20   Temp: 97.8 F (36.6 C)  97.6 F (36.4 C)   TempSrc: Oral  Oral   SpO2: 90% 92% 92% 98%  Weight:      Height:        Intake/Output Summary (Last 24 hours) at 01/27/2018 1330 Last data filed at 01/27/2018 1200 Gross per 24 hour  Intake 1080 ml  Output -  Net 1080 ml   Weight change:  Exam:   General:  Pt is alert, follows commands appropriately, not in acute distress  HEENT: No icterus, No thrush, No neck mass, [place]/AT  Cardiovascular: RRR, S1/S2, no rubs, no gallops  Respiratory: Bilateral scattered rales has resolved.  Bibasilar wheezing improving.  Overall diminished  Abdomen: Soft/+BS, RUQ tender, non distended, no guarding  Extremities: No edema, No lymphangitis, No petechiae, No rashes, no synovitis   Data Reviewed: I have personally reviewed following labs and imaging studies Basic Metabolic Panel: Recent Labs  Lab 01/23/18 0340 01/23/18 3716 01/23/18 0615 01/24/18 0432 01/24/18 0740 01/25/18 0611 01/26/18 0614  NA 137  --   --  139  --  139 137  K 3.9  --   --  4.8  --  4.6 4.2  CL 99*  --   --  102  --  98* 97*  CO2 26  --   --  28  --  30 28  GLUCOSE 150*  --   --  200*  --  260* 105*  BUN 9  --   --  13  --  15 14  CREATININE 0.84  --   --  0.87  --  0.84 0.81  CALCIUM 9.4  --   --  9.3  --  9.4 9.2  MG  --  1.6*  --   --  2.1 2.0  --   PHOS  --   --  4.0  --   --   --   --    Liver Function Tests: Recent Labs  Lab 01/26/18 0614  AST 13*  ALT 20  ALKPHOS 69  BILITOT 0.5  PROT 6.9  ALBUMIN 3.6   No results for input(s): LIPASE, AMYLASE in the last 168 hours. No results for input(s): AMMONIA in the last 168 hours. Coagulation Profile: No results for input(s): INR, PROTIME in the last 168 hours. CBC: Recent Labs  Lab 01/23/18 0340 01/24/18 0432 01/26/18 0614  WBC 11.4* 15.4* 16.1*  NEUTROABS 8.2* 13.7* 12.3*  HGB 16.3 15.2 15.2  HCT 50.5 48.4 48.4   MCV 92.8 94.3 93.3  PLT 287 283 263   Cardiac Enzymes: Recent Labs  Lab 01/23/18 0340  TROPONINI <0.03   BNP: Invalid input(s): POCBNP CBG: No results for input(s): GLUCAP in the last 168 hours. HbA1C: No results for input(s): HGBA1C in the last 72 hours. Urine analysis:    Component Value Date/Time   COLORURINE YELLOW 01/22/2017 1524   APPEARANCEUR CLOUDY (A) 01/22/2017 1524   LABSPEC 1.021 01/22/2017 1524   PHURINE 7.0 01/22/2017 1524   GLUCOSEU NEGATIVE 01/22/2017 1524   HGBUR NEGATIVE 01/22/2017 1524   BILIRUBINUR NEGATIVE 01/22/2017 1524   KETONESUR NEGATIVE 01/22/2017 1524   PROTEINUR NEGATIVE 01/22/2017 1524   UROBILINOGEN 0.2 10/21/2013 0200   NITRITE NEGATIVE 01/22/2017 1524   LEUKOCYTESUR NEGATIVE 01/22/2017 1524   Sepsis Labs: @LABRCNTIP (procalcitonin:4,lacticidven:4) ) Recent Results (from the past 240 hour(s))  Respiratory Panel by PCR     Status: None   Collection Time: 01/23/18  9:00 AM  Result Value Ref Range Status   Adenovirus NOT DETECTED NOT DETECTED Final   Coronavirus 229E NOT DETECTED NOT DETECTED Final   Coronavirus HKU1 NOT DETECTED NOT DETECTED Final   Coronavirus NL63 NOT DETECTED NOT DETECTED Final   Coronavirus OC43 NOT DETECTED NOT DETECTED Final   Metapneumovirus NOT DETECTED NOT DETECTED Final   Rhinovirus / Enterovirus NOT DETECTED NOT DETECTED Final   Influenza A NOT DETECTED NOT DETECTED Final   Influenza B NOT DETECTED NOT DETECTED Final   Parainfluenza Virus 1 NOT DETECTED NOT DETECTED Final   Parainfluenza Virus 2 NOT DETECTED NOT DETECTED Final   Parainfluenza Virus 3 NOT DETECTED NOT DETECTED Final   Parainfluenza Virus 4 NOT DETECTED NOT DETECTED Final   Respiratory Syncytial Virus NOT DETECTED NOT DETECTED Final   Bordetella pertussis NOT DETECTED NOT DETECTED Final   Chlamydophila pneumoniae NOT DETECTED NOT DETECTED Final   Mycoplasma pneumoniae NOT DETECTED NOT DETECTED Final    Comment: Performed at [facility], [place] [address] [place], [place] [address]     Scheduled Meds: . budesonide (PULMICORT) nebulizer solution  0.5 mg Nebulization BID  . enoxaparin (LOVENOX) injection  0.5 mg/kg Subcutaneous Q24H  . guaiFENesin  600 mg Oral BID  . ipratropium-albuterol  3 mL Nebulization Q6H  . mouth rinse  15 mL Mouth Rinse BID  . methylPREDNISolone (SOLU-MEDROL) injection  60 mg Intravenous Q12H  . nicotine  14 mg Transdermal Daily  . pantoprazole  80 mg Oral Daily  . polyethylene glycol  17 g Oral Daily  . senna-docusate  1 tablet Oral BID   Continuous Infusions:  Procedures/Studies: Dg Chest 2 View  Result Date: 12/31/2017 CLINICAL DATA:  Worsening shortness of breath. 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 EXAM: CHEST  2 VIEW COMPARISON:  12/09/2015 FINDINGS: The cardiomediastinal silhouette is unchanged and within normal limits. There are chronic bronchitic changes. The left lung is better inflated than on the prior study with minimal scarring noted in the left lung base. Triangular left basilar opacity on the prior study is no longer readily apparent. There is a small right pleural effusion. No pneumothorax is identified. No acute osseous abnormality is seen. IMPRESSION: 1. New small right pleural effusion. 2. Chronic bronchitic changes. 3. Improved aeration of the left lung base. Electronically Signed   By: Logan Bores M.D.   On: 12/31/2017 12:46   Ct Abdomen Pelvis W Contrast  Result Date: 01/26/2018 CLINICAL DATA:  Abdominal pain. History treated metastatic non-small EXAM: CT ABDOMEN AND PELVIS WITH CONTRAST TECHNIQUE: Multidetector CT imaging of the abdomen and pelvis was performed using the standard protocol following bolus administration of intravenous contrast. CONTRAST:  155mL ISOVUE-300 IOPAMIDOL (ISOVUE-300) INJECTION 61%, 69mL ISOVUE-300 IOPAMIDOL (ISOVUE-300) INJECTION 61% COMPARISON:  12/23/ FINDINGS: Lower chest: There is a small nonspecific pulmonary  nodule in the right lower lobe measuring 4 mm, image 1/4. Hepatobiliary: Mild hepatic steatosis. No focal liver abnormality. Small stones noted within the neck of gallbladder measuring around 3 mm. No gallbladder wall thickening. No biliary dilatation. Pancreas: Unremarkable. No pancreatic ductal dilatation or surrounding inflammatory changes. Spleen: Normal in size without focal abnormality. Adrenals/Urinary Tract: The adrenal glands appear normal. Unremarkable appearance of the right kidney. Left kidney cysts noted. No mass or hydronephrosis identified. Urinary bladder appears normal. Stomach/Bowel: The stomach appears normal. The small bowel loops have a normal course and caliber. Normal appearance of the appendix. Unremarkable appearance of the colon. Vascular/Lymphatic: Aortic atherosclerosis. No aneurysm. No enlarged  retroperitoneal or mesenteric adenopathy. No enlarged pelvic or inguinal lymph nodes. Reproductive: Prostate is unremarkable. Other: Fat containing periumbilical hernia identified. 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 Previous right hip arthroplasty. IMPRESSION: 1. No acute findings within the abdomen or pelvis. 2. Nonspecific 4 mm right lower lobe lung nodule is new from previous exam. No follow-up needed if patient is low-risk. Non-contrast chest CT can be considered in 12 months if patient is high-risk. This recommendation follows the consensus statement: Guidelines for Management of Incidental Pulmonary Nodules Detected on CT Images: From the Fleischner Society 2017; Radiology 2017; 284:228-243. 3.  Aortic Atherosclerosis (ICD10-I70.0). 4. Gallstones. 5. Hepatic steatosis. Electronically Signed   By: Kerby Moors M.D.   On: 01/26/2018 07:18   Dg Chest Portable 1 View  Result Date: 01/23/2018 CLINICAL DATA:  57 y/o M; worsening shortness of breath. Metastatic lung cancer. EXAM: PORTABLE CHEST 1 VIEW COMPARISON:  12/31/2017 chest radiograph FINDINGS:  Stable cardiac silhouette given projection and technique. Right mid lung zone platelike atelectasis or scarring. Blunting of right costal diaphragmatic angle, probable small lesion is stable. Pulmonary venous hypertension. Postsurgical changes in left paratracheal region. Bones are unremarkable. IMPRESSION: 1. Stable small left effusion.  No consolidation. 2. Pulmonary venous hypertension. Electronically Signed   By: Kristine Garbe M.D.   On: 01/23/2018 04:15    Florencia Reasons, MD PhD  [facility] Pager [phone]  If 7PM-7AM, please contact night-coverage [email or web address] Password [email or web address] 01/27/2018, 1:30 PM   LOS: 4 days

## 2018-01-28 LAB — BASIC METABOLIC PANEL
Anion gap: 14 (ref 5–15)
BUN: 18 mg/dL (ref 6–20)
CHLORIDE: 96 mmol/L — AB (ref 101–111)
CO2: 28 mmol/L (ref 22–32)
CREATININE: 0.88 mg/dL (ref 0.61–1.24)
Calcium: 9.5 mg/dL (ref 8.9–10.3)
GFR calc Af Amer: >60 mL/min (ref >60)
GFR calc non Af Amer: >60 mL/min (ref >60)
GLUCOSE: 202 mg/dL — AB (ref 65–99)
Potassium: 4.7 mmol/L (ref 3.5–5.1)
Sodium: 138 mmol/L (ref 135–145)

## 2018-01-28 LAB — CBC
HCT: 50.6 % (ref 39.0–52.0)
HEMOGLOBIN: 15.9 g/dL (ref 13.0–17.0)
MCH: 29.5 pg (ref 26.0–34.0)
MCHC: 31.4 g/dL (ref 30.0–36.0)
MCV: 93.9 fL (ref 78.0–100.0)
Platelets: 289 10*3/uL (ref 150–400)
RBC: 5.39 MIL/uL (ref 4.22–5.81)
RDW: 13.5 % (ref 11.5–15.5)
WBC: 17.4 10*3/uL — ABNORMAL HIGH (ref 4.0–10.5)

## 2018-01-28 MED ORDER — PREDNISONE 10 MG PO TABS: 60.0000 mg | ORAL_TABLET | Freq: Every day | ORAL | 0 refills | Status: DC

## 2018-01-28 MED ORDER — CYCLOBENZAPRINE HCL 5 MG PO TABS: 5.0000 mg | ORAL_TABLET | Freq: Three times a day (TID) | ORAL | 0 refills | Status: DC | PRN

## 2018-01-28 NOTE — Care Management Important Message (Signed)
Important Message  Patient Details  Name: Miguel Marsh MRN: [id number] Date of Birth: 02/22/1961   Medicare Important Message Given:  Yes    Shelda Altes 01/28/2018, 1:22 PM

## 2018-01-28 NOTE — Discharge Summary (Signed)
Physician Discharge Summary  ARIS EVEN LZJ:[id number] DOB: 05-13-1961 DOA: 01/23/2018  PCP: [name], DO  Admit date: 01/23/2018 Discharge date: 01/28/2018  Admitted From: Home Disposition:  Home   Recommendations for Outpatient Follow-up:  1. Follow up with PCP in 1-2 weeks 2. Please obtain BMP/CBC in one week     Discharge Condition: Stable CODE STATUS: FULL Diet recommendation: Heart Healthy / Carb Modified    Brief/Interim Summary: 57 y.o.malewith medical history ofmetastatic non-small cell lung cancer in remission, hypertension, chronic respiratory failure on 2 L at night, GERD, COPD, tobacco abuse presented with 1 day history of worsening shortness of breath, cough, and white sputum production. The patient continues to smoke 5 cigarettes/day. He has a nearly 80 pack year history.The patient was recently discharged from the hospital after a stay from 12/31/17 through 01/02/18 for acute on chronic respiratory failure due to COPD exacerbation. The patient was discharged home with prednisone taper. He was doing well until the evening of 01/22/2018 when he developed worsening shortness of breath and nonproductive cough with occasional white sputum. He denies any recent sick contacts. He used his nebulizers at home without improvement. EMS was activated. He was noted to have function saturation of 90% on 2 L. He was given Solu-Medrol and albuterol by EMS. He was placed on BiPAP in the emergency department. He has been since weaned off. In the emergency department, the patient was afebrile hemodynamically stable saturating 96% on 4 L. BMP and CBC were essentially unremarkable except for WBC of 11.4. Magnesium was 1.6. Initial troponin was negative. EKG showed sinus tachycardia without any ST-T wave changes. ABG showed 7.3 0/61/84/25 on 40% BiPAP. Chest x-ray showed a small left effusion without any consolidation. 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     Discharge Diagnoses:  Acute on chronic respiratory failure with hypoxiaand hypercarbia -stable on3L nasal cannula>>>weaned to 2L -Normally only on nighttime oxygen, 2 Land prn during daytime -Wean oxygen for saturation greater than 92%  COPD exacerbation -ContinuePulmicort -had more wheezing and dyspnea when weaned to prednisone -3.29--restart IV solumedrol q 12hr>>>improved -Continue duo nebs during hospitalization -viral respiratory panel--neg -home with prednisone taper  Malignancy related pain -Continue home dose of oxycodone -EKG--neg for ischemic changes -pt has had chronic Right lower chest, RUQ pain since his surgery 12/04/17 -01/25/18--worsen R-sided pain-->order CT abd although suspect due to coughing-->muscle strain -hycodan for cough -01/25/2018 CT abdomen and pelvis--no acute findings, hepatic steatosis, cholelithiasis  Non-small cell lung cancer -Presently in remission -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, mediastinal mass excision and lymph node dissection with nerve sheath tumor resection 12/04/2017 at [facility] -follow up cardiothoracic at [facility]  Essential hypertension -HoldingHCTZ and monitor clinically -BP remains controlled  Medication induced Hyperglycemia -01/24/18 hemoglobin A1c--6.1 -start novolog ISS -elevated CBG due to steroids  Morbid obesity -BMI 40.27 -Lifestyle modification  Tobacco abuse -cessation discussed  Hypomagnesemia -repleted -check mag--2.0  RLL nodule -incidental finding on CT abd -outpt surveillance    Discharge Instructions  Discharge Instructions    Diet - low sodium heart healthy   Complete by:  As directed    Increase activity slowly   Complete by:  As directed      Allergies as of 01/28/2018   No Known Allergies     Medication List    TAKE these medications    albuterol 108 (90 Base) MCG/ACT inhaler Commonly known as:  PROVENTIL HFA;VENTOLIN HFA Inhale 2 puffs into the  lungs every 6 (six) hours as needed for wheezing.   albuterol (2.5 MG/3ML) 0.083% nebulizer solution Commonly known as:  PROVENTIL Take 2.5 mg by nebulization every 4 (four) hours as needed for wheezing or shortness of breath.   cyclobenzaprine 5 MG tablet Commonly known as:  FLEXERIL Take 1 tablet (5 mg total) by mouth 3 (three) times daily as needed for muscle spasms.   guaiFENesin 600 MG 12 hr tablet Commonly known as:  MUCINEX Take 1 tablet (600 mg total) by mouth 2 (two) times daily.   hydrochlorothiazide 12.5 MG capsule Commonly known as:  MICROZIDE Take 1 capsule by mouth daily.   nicotine polacrilex 2 MG gum Commonly known as:  NICORETTE Take 2 mg by mouth as needed for smoking cessation.   Oxycodone HCl 10 MG Tabs Take 10 mg by mouth every 4 (four) hours as needed (pain).   predniSONE 10 MG tablet Commonly known as:  DELTASONE Take 6 tablets (60 mg total) by mouth daily with breakfast. And decrease by one tablet daily Start taking on:  01/29/2018 What changed:    how much to take  additional instructions   PRILOSEC 40 MG capsule Generic drug:  omeprazole Take 40 mg by mouth daily.   senna-docusate 8.6-50 MG tablet Commonly known as:  Senokot-S Take 2 tablets by mouth at bedtime as needed for mild constipation.   SYMBICORT 80-4.5 MCG/ACT inhaler Generic drug:  budesonide-formoterol Inhale 2 puffs into the lungs 2 (two) times daily.   tiotropium 18 MCG inhalation capsule Commonly known as:  SPIRIVA Place 18 mcg into inhaler and inhale daily.      Follow-up Information    [name], DO Follow up in 1 week(s).   Why:  hospital discharge follow up Contact information: [place] [address] [phone]          No Known Allergies  Consultations:  none   Procedures/Studies: Dg Chest 2 View  Result Date:  12/31/2017 CLINICAL DATA:  Worsening shortness of breath. 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 EXAM: CHEST  2 VIEW COMPARISON:  12/09/2015 FINDINGS: The cardiomediastinal silhouette is unchanged and within normal limits. There are chronic bronchitic changes. The left lung is better inflated than on the prior study with minimal scarring noted in the left lung base. Triangular left basilar opacity on the prior study is no longer readily apparent. There is a small right pleural effusion. No pneumothorax is identified. No acute osseous abnormality is seen. IMPRESSION: 1. New small right pleural effusion. 2. Chronic bronchitic changes. 3. Improved aeration of the left lung base. Electronically Signed   By: Logan Bores M.D.   On: 12/31/2017 12:46   Ct Abdomen Pelvis W Contrast  Result Date: 01/26/2018 CLINICAL DATA:  Abdominal pain. History treated metastatic non-small EXAM: CT ABDOMEN AND PELVIS WITH CONTRAST TECHNIQUE: Multidetector CT imaging of the abdomen and pelvis was performed using the standard protocol following bolus administration of intravenous contrast. CONTRAST:  171mL ISOVUE-300 IOPAMIDOL (ISOVUE-300) INJECTION 61%, 76mL ISOVUE-300 IOPAMIDOL (ISOVUE-300) INJECTION 61% COMPARISON:  12/23/ FINDINGS: Lower chest: There is a small nonspecific pulmonary nodule in the right lower lobe measuring 4 mm, image 1/4. Hepatobiliary: Mild hepatic steatosis. No focal liver abnormality. Small stones noted within the neck of gallbladder measuring around 3 mm. No gallbladder wall thickening. No biliary dilatation. Pancreas: Unremarkable. No pancreatic ductal dilatation or surrounding inflammatory changes. Spleen: Normal in size without focal abnormality. Adrenals/Urinary Tract: The adrenal glands appear normal. Unremarkable appearance  of the right kidney. Left kidney cysts noted. No mass or hydronephrosis identified. Urinary bladder appears normal. Stomach/Bowel: The stomach appears  normal. The small bowel loops have a normal course and caliber. Normal appearance of the appendix. Unremarkable appearance of the colon. Vascular/Lymphatic: Aortic atherosclerosis. No aneurysm. No enlarged retroperitoneal or mesenteric adenopathy. No enlarged pelvic or inguinal lymph nodes. Reproductive: Prostate is unremarkable. Other: Fat containing periumbilical hernia identified. 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 Previous right hip arthroplasty. IMPRESSION: 1. No acute findings within the abdomen or pelvis. 2. Nonspecific 4 mm right lower lobe lung nodule is new from previous exam. No follow-up needed if patient is low-risk. Non-contrast chest CT can be considered in 12 months if patient is high-risk. This recommendation follows the consensus statement: Guidelines for Management of Incidental Pulmonary Nodules Detected on CT Images: From the Fleischner Society 2017; Radiology 2017; 284:228-243. 3.  Aortic Atherosclerosis (ICD10-I70.0). 4. Gallstones. 5. Hepatic steatosis. Electronically Signed   By: Kerby Moors M.D.   On: 01/26/2018 07:18   Dg Chest Portable 1 View  Result Date: 01/23/2018 CLINICAL DATA:  57 y/o M; worsening shortness of breath. Metastatic lung cancer. EXAM: PORTABLE CHEST 1 VIEW COMPARISON:  12/31/2017 chest radiograph FINDINGS: Stable cardiac silhouette given projection and technique. Right mid lung zone platelike atelectasis or scarring. Blunting of right costal diaphragmatic angle, probable small lesion is stable. Pulmonary venous hypertension. Postsurgical changes in left paratracheal region. Bones are unremarkable. IMPRESSION: 1. Stable small left effusion.  No consolidation. 2. Pulmonary venous hypertension. Electronically Signed   By: Kristine Garbe M.D.   On: 01/23/2018 04:15         Discharge Exam: Vitals:   01/28/18 1432 01/28/18 1500  BP:  114/70  Pulse:  84  Resp:  19  Temp:  97.9 F (36.6 C)  SpO2: 95% 98%    Vitals:   01/28/18 0741 01/28/18 0745 01/28/18 1432 01/28/18 1500  BP:    114/70  Pulse:    84  Resp:    19  Temp:    97.9 F (36.6 C)  TempSrc:    Oral  SpO2: 96% 99% 95% 98%  Weight:      Height:        General: Pt is alert, awake, not in acute distress Cardiovascular: RRR, S1/S2 +, no rubs, no gallops Respiratory: CTA bilaterally, no wheezing, no rhonchi Abdominal: Soft, NT, ND, bowel sounds + Extremities: no edema, no cyanosis   The results of significant diagnostics from this hospitalization (including imaging, microbiology, ancillary and laboratory) are listed below for reference.    Significant Diagnostic Studies: Dg Chest 2 View  Result Date: 12/31/2017 CLINICAL DATA:  Worsening shortness of breath. 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 EXAM: CHEST  2 VIEW COMPARISON:  12/09/2015 FINDINGS: The cardiomediastinal silhouette is unchanged and within normal limits. There are chronic bronchitic changes. The left lung is better inflated than on the prior study with minimal scarring noted in the left lung base. Triangular left basilar opacity on the prior study is no longer readily apparent. There is a small right pleural effusion. No pneumothorax is identified. No acute osseous abnormality is seen. IMPRESSION: 1. New small right pleural effusion. 2. Chronic bronchitic changes. 3. Improved aeration of the left lung base. Electronically Signed   By: Logan Bores M.D.   On: 12/31/2017 12:46   Ct Abdomen Pelvis W Contrast  Result Date: 01/26/2018 CLINICAL DATA:  Abdominal pain. History treated metastatic non-small  EXAM: CT ABDOMEN AND PELVIS WITH CONTRAST TECHNIQUE: Multidetector CT imaging of the abdomen and pelvis was performed using the standard protocol following bolus administration of intravenous contrast. CONTRAST:  13mL ISOVUE-300 IOPAMIDOL (ISOVUE-300) INJECTION 61%, 52mL ISOVUE-300 IOPAMIDOL (ISOVUE-300) INJECTION 61% COMPARISON:  12/23/  FINDINGS: Lower chest: There is a small nonspecific pulmonary nodule in the right lower lobe measuring 4 mm, image 1/4. Hepatobiliary: Mild hepatic steatosis. No focal liver abnormality. Small stones noted within the neck of gallbladder measuring around 3 mm. No gallbladder wall thickening. No biliary dilatation. Pancreas: Unremarkable. No pancreatic ductal dilatation or surrounding inflammatory changes. Spleen: Normal in size without focal abnormality. Adrenals/Urinary Tract: The adrenal glands appear normal. Unremarkable appearance of the right kidney. Left kidney cysts noted. No mass or hydronephrosis identified. Urinary bladder appears normal. Stomach/Bowel: The stomach appears normal. The small bowel loops have a normal course and caliber. Normal appearance of the appendix. Unremarkable appearance of the colon. Vascular/Lymphatic: Aortic atherosclerosis. No aneurysm. No enlarged retroperitoneal or mesenteric adenopathy. No enlarged pelvic or inguinal lymph nodes. Reproductive: Prostate is unremarkable. Other: Fat containing periumbilical hernia identified. 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 Previous right hip arthroplasty. IMPRESSION: 1. No acute findings within the abdomen or pelvis. 2. Nonspecific 4 mm right lower lobe lung nodule is new from previous exam. No follow-up needed if patient is low-risk. Non-contrast chest CT can be considered in 12 months if patient is high-risk. This recommendation follows the consensus statement: Guidelines for Management of Incidental Pulmonary Nodules Detected on CT Images: From the Fleischner Society 2017; Radiology 2017; 284:228-243. 3.  Aortic Atherosclerosis (ICD10-I70.0). 4. Gallstones. 5. Hepatic steatosis. Electronically Signed   By: Kerby Moors M.D.   On: 01/26/2018 07:18   Dg Chest Portable 1 View  Result Date: 01/23/2018 CLINICAL DATA:  57 y/o M; worsening shortness of breath. Metastatic lung cancer. EXAM: PORTABLE  CHEST 1 VIEW COMPARISON:  12/31/2017 chest radiograph FINDINGS: Stable cardiac silhouette given projection and technique. Right mid lung zone platelike atelectasis or scarring. Blunting of right costal diaphragmatic angle, probable small lesion is stable. Pulmonary venous hypertension. Postsurgical changes in left paratracheal region. Bones are unremarkable. IMPRESSION: 1. Stable small left effusion.  No consolidation. 2. Pulmonary venous hypertension. Electronically Signed   By: Kristine Garbe M.D.   On: 01/23/2018 04:15     Microbiology: Recent Results (from the past 240 hour(s))  Respiratory Panel by PCR     Status: None   Collection Time: 01/23/18  9:00 AM  Result Value Ref Range Status   Adenovirus NOT DETECTED NOT DETECTED Final   Coronavirus 229E NOT DETECTED NOT DETECTED Final   Coronavirus HKU1 NOT DETECTED NOT DETECTED Final   Coronavirus NL63 NOT DETECTED NOT DETECTED Final   Coronavirus OC43 NOT DETECTED NOT DETECTED Final   Metapneumovirus NOT DETECTED NOT DETECTED Final   Rhinovirus / Enterovirus NOT DETECTED NOT DETECTED Final   Influenza A NOT DETECTED NOT DETECTED Final   Influenza B NOT DETECTED NOT DETECTED Final   Parainfluenza Virus 1 NOT DETECTED NOT DETECTED Final   Parainfluenza Virus 2 NOT DETECTED NOT DETECTED Final   Parainfluenza Virus 3 NOT DETECTED NOT DETECTED Final   Parainfluenza Virus 4 NOT DETECTED NOT DETECTED Final   Respiratory Syncytial Virus NOT DETECTED NOT DETECTED Final   Bordetella pertussis NOT DETECTED NOT DETECTED Final   Chlamydophila pneumoniae NOT DETECTED NOT DETECTED Final   Mycoplasma pneumoniae NOT DETECTED NOT DETECTED Final    Comment: Performed at [facility]  Lab, [address], [place], [place] [address]     Labs: Basic Metabolic Panel: Recent Labs  Lab 01/23/18 0340 01/23/18 9147 01/23/18 0615 01/24/18 0432 01/24/18 0740 01/25/18 0611 01/26/18 0614 01/28/18 0603  NA 137  --   --  139  --  139 137 138  K  3.9  --   --  4.8  --  4.6 4.2 4.7  CL 99*  --   --  102  --  98* 97* 96*  CO2 26  --   --  28  --  30 28 28   GLUCOSE 150*  --   --  200*  --  260* 105* 202*  BUN 9  --   --  13  --  15 14 18   CREATININE 0.84  --   --  0.87  --  0.84 0.81 0.88  CALCIUM 9.4  --   --  9.3  --  9.4 9.2 9.5  MG  --  1.6*  --   --  2.1 2.0  --   --   PHOS  --   --  4.0  --   --   --   --   --    Liver Function Tests: Recent Labs  Lab 01/26/18 0614  AST 13*  ALT 20  ALKPHOS 69  BILITOT 0.5  PROT 6.9  ALBUMIN 3.6   No results for input(s): LIPASE, AMYLASE in the last 168 hours. No results for input(s): AMMONIA in the last 168 hours. CBC: Recent Labs  Lab 01/23/18 0340 01/24/18 0432 01/26/18 0614 01/28/18 0603  WBC 11.4* 15.4* 16.1* 17.4*  NEUTROABS 8.2* 13.7* 12.3*  --   HGB 16.3 15.2 15.2 15.9  HCT 50.5 48.4 48.4 50.6  MCV 92.8 94.3 93.3 93.9  PLT 287 283 263 289   Cardiac Enzymes: Recent Labs  Lab 01/23/18 0340  TROPONINI <0.03   BNP: Invalid input(s): POCBNP CBG: No results for input(s): GLUCAP in the last 168 hours.  Time coordinating discharge:  Greater than 30 minutes  Signed:  [name], DO [facility] Pager: [phone] 01/28/2018, 4:06 PM

## 2018-01-28 NOTE — Progress Notes (Signed)
IV removed and scripts sent to [facility].  To f/u with primary MD and have labs in one week

## 2018-01-28 NOTE — Care Management Note (Signed)
Case Management Note  Patient Details  Name: Miguel Marsh MRN: [id number] Date of Birth: 1961/06/21  Subjective/Objective:      Admitted with COPD. Pt is from home, ind with ADL's has PCP, transportation and insurance with drug coverage. He has neb machine and home oxygen pta [facility] [employer]. He has no HH pta. He has no one to bring port O2 to hospital for transport home.               Action/Plan: DC home today with self care. CM has offered to have port tank delivered to hospital for transport home. Pt declines and says he will be okay to get home, he lives not far. CM told patient to notify anyone if he changes his mind.   Expected Discharge Date:       01/28/18           Expected Discharge Plan:  Home/Self Care  In-House Referral:  NA  Discharge planning Services  CM Consult  Post Acute Care Choice:  NA Choice offered to:  NA  Status of Service:  Completed, signed off   Sherald Barge, RN 01/28/2018, 1:36 PM

## 2018-02-28 NOTE — Congregational Nurse Program (Signed)
Congregational Nurse Program Note  Date of Encounter: 02/28/2018  Past Medical History: Past Medical History:  Diagnosis Date  . Arthritis   . Bronchitis   . Cancer [facility])    metastatic NSCLC (followed by [facility])  . COPD (chronic obstructive pulmonary disease) ([place])   . HTN (hypertension)     Encounter Details: CNP Questionnaire - 02/27/18 2202      Questionnaire   Patient Status  Not Applicable    Race  Black or African American    Location Patient Served At  [employer], [employer];Medicare    Uninsured  Not Applicable    Food  Yes, have food insecurities    Housing/Utilities  Yes, have permanent housing    Transportation  No transportation needs    Interpersonal Safety  Yes, feel physically and emotionally safe where you currently live    Medication  No medication insecurities    Medical Provider  Yes    Referrals  Not Applicable    ED Visit Averted  Not Applicable    Life-Saving Intervention Made  Not Applicable     Seen at the [employer].No complaints B/P 147/93; P [place], [place] [employer] [phone]

## 2018-03-06 ENCOUNTER — Inpatient Hospital Stay (HOSPITAL_COMMUNITY)
Admission: EM | Admit: 2018-03-06 | Discharge: 2018-03-08 | DRG: 191 | Disposition: A | Discharge status: Home / Self Care | Payer: Medicare Other | Attending: Internal Medicine | Admitting: Internal Medicine

## 2018-03-06 ENCOUNTER — Other Ambulatory Visit: Payer: Self-pay

## 2018-03-06 ENCOUNTER — Emergency Department (HOSPITAL_COMMUNITY): Payer: Medicare Other

## 2018-03-06 ENCOUNTER — Other Ambulatory Visit (HOSPITAL_COMMUNITY): Payer: Medicare HMO

## 2018-03-06 ENCOUNTER — Observation Stay (HOSPITAL_BASED_OUTPATIENT_CLINIC_OR_DEPARTMENT_OTHER): Payer: Medicare Other

## 2018-03-06 ENCOUNTER — Encounter (HOSPITAL_COMMUNITY): Payer: Self-pay | Admitting: Emergency Medicine

## 2018-03-06 DIAGNOSIS — F17210 Nicotine dependence, cigarettes, uncomplicated: Secondary | ICD-10-CM | POA: Diagnosis present

## 2018-03-06 DIAGNOSIS — R402142 Coma scale, eyes open, spontaneous, at arrival to emergency department: Secondary | ICD-10-CM | POA: Diagnosis present

## 2018-03-06 DIAGNOSIS — C3491 Malignant neoplasm of unspecified part of right bronchus or lung: Secondary | ICD-10-CM | POA: Diagnosis present

## 2018-03-06 DIAGNOSIS — K219 Gastro-esophageal reflux disease without esophagitis: Secondary | ICD-10-CM | POA: Diagnosis present

## 2018-03-06 DIAGNOSIS — Z96649 Presence of unspecified artificial hip joint: Secondary | ICD-10-CM | POA: Diagnosis present

## 2018-03-06 DIAGNOSIS — Z7952 Long term (current) use of systemic steroids: Secondary | ICD-10-CM | POA: Diagnosis not present

## 2018-03-06 DIAGNOSIS — J441 Chronic obstructive pulmonary disease with (acute) exacerbation: Secondary | ICD-10-CM | POA: Diagnosis present

## 2018-03-06 DIAGNOSIS — Z9981 Dependence on supplemental oxygen: Secondary | ICD-10-CM

## 2018-03-06 DIAGNOSIS — D72829 Elevated white blood cell count, unspecified: Secondary | ICD-10-CM | POA: Diagnosis present

## 2018-03-06 DIAGNOSIS — I5030 Unspecified diastolic (congestive) heart failure: Secondary | ICD-10-CM | POA: Diagnosis not present

## 2018-03-06 DIAGNOSIS — I10 Essential (primary) hypertension: Secondary | ICD-10-CM | POA: Diagnosis not present

## 2018-03-06 DIAGNOSIS — R402362 Coma scale, best motor response, obeys commands, at arrival to emergency department: Secondary | ICD-10-CM | POA: Diagnosis present

## 2018-03-06 DIAGNOSIS — Z809 Family history of malignant neoplasm, unspecified: Secondary | ICD-10-CM

## 2018-03-06 DIAGNOSIS — R0989 Other specified symptoms and signs involving the circulatory and respiratory systems: Secondary | ICD-10-CM | POA: Diagnosis present

## 2018-03-06 DIAGNOSIS — T380X5A Adverse effect of glucocorticoids and synthetic analogues, initial encounter: Secondary | ICD-10-CM | POA: Diagnosis present

## 2018-03-06 DIAGNOSIS — R402252 Coma scale, best verbal response, oriented, at arrival to emergency department: Secondary | ICD-10-CM | POA: Diagnosis present

## 2018-03-06 DIAGNOSIS — R0902 Hypoxemia: Secondary | ICD-10-CM

## 2018-03-06 DIAGNOSIS — J449 Chronic obstructive pulmonary disease, unspecified: Secondary | ICD-10-CM | POA: Diagnosis present

## 2018-03-06 LAB — COMPREHENSIVE METABOLIC PANEL
ALT: 26 U/L (ref 17–63)
AST: 19 U/L (ref 15–41)
Albumin: 3.8 g/dL (ref 3.5–5.0)
Alkaline Phosphatase: 78 U/L (ref 38–126)
Anion gap: 9 (ref 5–15)
BUN: 9 mg/dL (ref 6–20)
CHLORIDE: 100 mmol/L — AB (ref 101–111)
CO2: 29 mmol/L (ref 22–32)
CREATININE: 0.93 mg/dL (ref 0.61–1.24)
Calcium: 9.1 mg/dL (ref 8.9–10.3)
GFR calc non Af Amer: >60 mL/min (ref >60)
Glucose, Bld: 132 mg/dL — ABNORMAL HIGH (ref 65–99)
POTASSIUM: 3.8 mmol/L (ref 3.5–5.1)
Sodium: 138 mmol/L (ref 135–145)
Total Bilirubin: 1.1 mg/dL (ref 0.3–1.2)
Total Protein: 7.2 g/dL (ref 6.5–8.1)

## 2018-03-06 LAB — BRAIN NATRIURETIC PEPTIDE: B Natriuretic Peptide: 22 pg/mL (ref 0.0–100.0)

## 2018-03-06 LAB — CBC
HCT: 49.4 % (ref 39.0–52.0)
Hemoglobin: 16.2 g/dL (ref 13.0–17.0)
MCH: 30.7 pg (ref 26.0–34.0)
MCHC: 32.8 g/dL (ref 30.0–36.0)
MCV: 93.7 fL (ref 78.0–100.0)
PLATELETS: 243 10*3/uL (ref 150–400)
RBC: 5.27 MIL/uL (ref 4.22–5.81)
RDW: 13.9 % (ref 11.5–15.5)
WBC: 11.1 10*3/uL — ABNORMAL HIGH (ref 4.0–10.5)

## 2018-03-06 LAB — ECHOCARDIOGRAM COMPLETE
HEIGHTINCHES: 66 in
WEIGHTICAEL: 3972.8 [oz_av]

## 2018-03-06 MED ORDER — CYCLOBENZAPRINE HCL 10 MG PO TABS: 5.0000 mg | ORAL_TABLET | Freq: Three times a day (TID) | ORAL | Status: DC | PRN

## 2018-03-06 MED ORDER — ACETAMINOPHEN 650 MG RE SUPP: 650.0000 mg | Freq: Four times a day (QID) | RECTAL | Status: DC | PRN

## 2018-03-06 MED ORDER — ALBUTEROL (5 MG/ML) CONTINUOUS INHALATION SOLN
10.0000 mg/h | INHALATION_SOLUTION | Freq: Once | RESPIRATORY_TRACT | Status: AC
Start: 2018-03-06 — End: 2018-03-06
  Administered 2018-03-06: 10 mg/h via RESPIRATORY_TRACT
  Filled 2018-03-06: qty 20

## 2018-03-06 MED ORDER — HYDROCHLOROTHIAZIDE 12.5 MG PO CAPS
12.5000 mg | ORAL_CAPSULE | Freq: Every day | ORAL | Status: DC
  Administered 2018-03-06 – 2018-03-08 (×3): 12.5 mg via ORAL
  Filled 2018-03-06 (×3): qty 1

## 2018-03-06 MED ORDER — ALBUTEROL SULFATE (2.5 MG/3ML) 0.083% IN NEBU: 2.5000 mg | INHALATION_SOLUTION | Freq: Four times a day (QID) | RESPIRATORY_TRACT | Status: DC

## 2018-03-06 MED ORDER — FUROSEMIDE 10 MG/ML IJ SOLN
40.0000 mg | Freq: Once | INTRAMUSCULAR | Status: AC
  Administered 2018-03-06: 40 mg via INTRAVENOUS
  Filled 2018-03-06: qty 4

## 2018-03-06 MED ORDER — ORAL CARE MOUTH RINSE
15.0000 mL | Freq: Two times a day (BID) | OROMUCOSAL | Status: DC
  Administered 2018-03-06 – 2018-03-07 (×2): 15 mL via OROMUCOSAL

## 2018-03-06 MED ORDER — IPRATROPIUM BROMIDE 0.02 % IN SOLN: 0.5000 mg | Freq: Four times a day (QID) | RESPIRATORY_TRACT | Status: DC

## 2018-03-06 MED ORDER — SODIUM CHLORIDE 0.9 % IV SOLN
INTRAVENOUS | Status: DC
Start: 2018-03-06 — End: 2018-03-08
  Administered 2018-03-06: 07:00:00 via INTRAVENOUS

## 2018-03-06 MED ORDER — METHYLPREDNISOLONE SODIUM SUCC 40 MG IJ SOLR
40.0000 mg | Freq: Two times a day (BID) | INTRAMUSCULAR | Status: DC
  Administered 2018-03-06 – 2018-03-07 (×2): 40 mg via INTRAVENOUS
  Filled 2018-03-06 (×2): qty 1

## 2018-03-06 MED ORDER — METHYLPREDNISOLONE SODIUM SUCC 125 MG IJ SOLR
125.0000 mg | Freq: Once | INTRAMUSCULAR | Status: AC
  Administered 2018-03-06: 125 mg via INTRAVENOUS
  Filled 2018-03-06: qty 2

## 2018-03-06 MED ORDER — PANTOPRAZOLE SODIUM 40 MG PO TBEC
40.0000 mg | DELAYED_RELEASE_TABLET | Freq: Every day | ORAL | Status: DC
  Administered 2018-03-06 – 2018-03-08 (×3): 40 mg via ORAL
  Filled 2018-03-06 (×3): qty 1

## 2018-03-06 MED ORDER — ENOXAPARIN SODIUM 60 MG/0.6ML ~~LOC~~ SOLN
0.5000 mg/kg | SUBCUTANEOUS | Status: DC
  Administered 2018-03-06 – 2018-03-08 (×3): 55 mg via SUBCUTANEOUS
  Filled 2018-03-06 (×3): qty 0.6

## 2018-03-06 MED ORDER — GUAIFENESIN ER 600 MG PO TB12
600.0000 mg | ORAL_TABLET | Freq: Two times a day (BID) | ORAL | Status: DC
  Administered 2018-03-06 – 2018-03-08 (×4): 600 mg via ORAL
  Filled 2018-03-06 (×5): qty 1

## 2018-03-06 MED ORDER — IPRATROPIUM-ALBUTEROL 0.5-2.5 (3) MG/3ML IN SOLN
3.0000 mL | Freq: Four times a day (QID) | RESPIRATORY_TRACT | Status: DC
  Administered 2018-03-06 – 2018-03-08 (×9): 3 mL via RESPIRATORY_TRACT
  Filled 2018-03-06 (×8): qty 3

## 2018-03-06 MED ORDER — ACETAMINOPHEN 325 MG PO TABS
650.0000 mg | ORAL_TABLET | Freq: Four times a day (QID) | ORAL | Status: DC | PRN
Start: 2018-03-06 — End: 2018-03-08

## 2018-03-06 MED ORDER — METHYLPREDNISOLONE SODIUM SUCC 125 MG IJ SOLR
60.0000 mg | Freq: Four times a day (QID) | INTRAMUSCULAR | Status: DC
  Administered 2018-03-06: 60 mg via INTRAVENOUS
  Filled 2018-03-06: qty 2

## 2018-03-06 MED ORDER — ONDANSETRON HCL 4 MG PO TABS: 4.0000 mg | ORAL_TABLET | Freq: Four times a day (QID) | ORAL | Status: DC | PRN

## 2018-03-06 MED ORDER — ONDANSETRON HCL 4 MG/2ML IJ SOLN: 4.0000 mg | Freq: Four times a day (QID) | INTRAMUSCULAR | Status: DC | PRN

## 2018-03-06 MED ORDER — OXYCODONE HCL 5 MG PO TABS: 10.0000 mg | ORAL_TABLET | ORAL | Status: DC | PRN

## 2018-03-06 MED ORDER — ALBUTEROL SULFATE (2.5 MG/3ML) 0.083% IN NEBU
5.0000 mg | INHALATION_SOLUTION | Freq: Once | RESPIRATORY_TRACT | Status: AC
  Administered 2018-03-06: 5 mg via RESPIRATORY_TRACT
  Filled 2018-03-06: qty 6

## 2018-03-06 MED ORDER — SENNOSIDES-DOCUSATE SODIUM 8.6-50 MG PO TABS: 2.0000 | ORAL_TABLET | Freq: Every evening | ORAL | Status: DC | PRN

## 2018-03-06 MED ORDER — GUAIFENESIN ER 600 MG PO TB12
600.0000 mg | ORAL_TABLET | Freq: Two times a day (BID) | ORAL | Status: DC
  Administered 2018-03-06: 600 mg via ORAL

## 2018-03-06 NOTE — Progress Notes (Signed)
Miguel Marsh  is a 57 y.o. male, with history of COPD, hypertension, non-small cell lung cancer came to hospital with chief complaints of shortness of breath since yesterday.  He has been admitted with an acute COPD exacerbation and has been started on IV Solu-Medrol as well as DuoNeb breathing treatments.  Additionally, he is noted to have pulmonary vascular congestion for which Lasix has been given with great diuresis noted.  2D echocardiogram is currently pending.  He has been seen and examined this morning appears much more comfortable and is able to lay flat and is currently on 3 L nasal cannula and typically wears 2 L at home.  He has no further wheezing noted on his exam but has some minimal chest tightness.  IV Solu-Medrol has been decreased to 40 mg IV twice daily in anticipation for hopeful discharge in a.m.

## 2018-03-06 NOTE — H&P (Signed)
TRH H&P    Patient Demographics:    Miguel Marsh, is a 57 y.o. male  MRN: [id number]  DOB - 03-07-1961  Admit Date - 03/06/2018  Referring MD/NP/PA: Dr Betsey Holiday  Outpatient Primary MD for the patient is [name], DO  Patient coming from: Home  Chief complaint- Shortness of breath   HPI:    Miguel Marsh  is a 57 y.o. male, with history of COPD, hypertension, non-small cell lung cancer came to hospital with chief complaints of shortness of breath since yesterday.  Patient denies coughing up any phlegm.  Denies chest pain. Denies nausea vomiting or diarrhea. No dysuria urgency or frequency of urination. Patient uses oxygen as needed at home usually at nighttime. In the ED chest x-ray showed pulmonary vascular congestion, BNP 22 Denies history of stroke or seizures.      Review of systems:      All other systems reviewed and are negative.   With Past History of the following :    Past Medical History:  Diagnosis Date  . Arthritis   . Bronchitis   . Cancer [facility])    metastatic NSCLC (followed by [facility])  . COPD (chronic obstructive pulmonary disease) ([place])   . HTN (hypertension)       Past Surgical History:  Procedure Laterality Date  . LUNG BIOPSY    . TOTAL HIP ARTHROPLASTY    . TUMOR REMOVAL Right 12/04/2017      Social History:      Social History   Tobacco Use  . Smoking status: Current Every Day Smoker    Packs/day: 0.10    Types: Cigarettes  . Smokeless tobacco: Never Used  Substance Use Topics  . Alcohol use: Yes    Comment: occ       Family History :   Positive family history of cancer   Home Medications:   Prior to Admission medications   Medication Sig Start Date End Date Taking? Authorizing Provider  albuterol (PROVENTIL HFA;VENTOLIN HFA) 108 (90 BASE) MCG/ACT inhaler Inhale 2 puffs into the lungs every 6 (six) hours as needed for wheezing.     [provider]  albuterol (PROVENTIL) (2.5 MG/3ML) 0.083% nebulizer solution Take 2.5 mg by nebulization every 4 (four) hours as needed for wheezing or shortness of breath.  11/22/15   [provider]  cyclobenzaprine (FLEXERIL) 5 MG tablet Take 1 tablet (5 mg total) by mouth 3 (three) times daily as needed for muscle spasms. 01/28/18   [name], MD  guaiFENesin (MUCINEX) 600 MG 12 hr tablet Take 1 tablet (600 mg total) by mouth 2 (two) times daily. 12/12/15   Kathie Dike, MD  hydrochlorothiazide (MICROZIDE) 12.5 MG capsule Take 1 capsule by mouth daily. 12/02/14   [provider]  nicotine polacrilex (NICORETTE) 2 MG gum Take 2 mg by mouth as needed for smoking cessation.    [provider]  omeprazole (PRILOSEC) 40 MG capsule Take 40 mg by mouth daily. 04/06/15   [provider]  Oxycodone HCl 10 MG TABS Take 10 mg by  mouth every 4 (four) hours as needed (pain).  07/12/15   [provider]  predniSONE (DELTASONE) 10 MG tablet Take 6 tablets (60 mg total) by mouth daily with breakfast. And decrease by one tablet daily 01/29/18   Tat, Shanon Brow, MD  senna-docusate (SENOKOT-S) 8.6-50 MG per tablet Take 2 tablets by mouth at bedtime as needed for mild constipation.  06/15/15   [provider]  SYMBICORT 80-4.5 MCG/ACT inhaler Inhale 2 puffs into the lungs 2 (two) times daily. 12/02/14   [provider]  tiotropium (SPIRIVA) 18 MCG inhalation capsule Place 18 mcg into inhaler and inhale daily.    [provider]     Allergies:    No Known Allergies   Physical Exam:   Vitals  Blood pressure (!) 145/96, pulse 93, temperature 98.1 F (36.7 C), temperature source Oral, resp. rate 18, weight 113.4 kg (250 lb), SpO2 98 %.  1.  General: Appears in no acute distress  2. Psychiatric:  Intact judgement and  insight, awake alert, oriented x 3.  3. Neurologic: No focal neurological deficits, all cranial nerves intact.Strength 5/5  all 4 extremities, sensation intact all 4 extremities, plantars down going.  4. Eyes :  anicteric sclerae, moist conjunctivae with no lid lag. PERRLA.  5. ENMT:  Oropharynx clear with moist mucous membranes and good dentition  6. Neck:  supple, no cervical lymphadenopathy appriciated, No thyromegaly  7. Respiratory : Normal respiratory effort, bilateral rhonchi auscultated  8. Cardiovascular : RRR, no gallops, rubs or murmurs, trace leg edema in lower extremities  9. Gastrointestinal:  Positive bowel sounds, abdomen soft, non-tender to palpation,no hepatosplenomegaly, no rigidity or guarding       10. Skin:  No cyanosis, normal texture and turgor, no rash, lesions or ulcers  11.Musculoskeletal:  Good muscle tone,  joints appear normal , no effusions,  normal range of motion    Data Review:    CBC Recent Labs  Lab 03/06/18 0144  WBC 11.1*  HGB 16.2  HCT 49.4  PLT 243  MCV 93.7  MCH 30.7  MCHC 32.8  RDW 13.9   ------------------------------------------------------------------------------------------------------------------  Chemistries  Recent Labs  Lab 03/06/18 0144  NA 138  K 3.8  CL 100*  CO2 29  GLUCOSE 132*  BUN 9  CREATININE 0.93  CALCIUM 9.1  AST 19  ALT 26  ALKPHOS 78  BILITOT 1.1   ------------------------------------------------------------------------------------------------------------------  ------------------------------------------------------------------------------------------------------------------ GFR: Estimated Creatinine Clearance: 104.9 mL/min (by C-G formula based on SCr of 0.93 mg/dL). Liver Function Tests: Recent Labs  Lab 03/06/18 0144  AST 19  ALT 26  ALKPHOS 78  BILITOT 1.1  PROT 7.2  ALBUMIN 3.8   No results for input(s): LIPASE, AMYLASE in the last 168 hours. No results for input(s): AMMONIA in the last 168 hours. Coagulation Profile: No results for input(s): INR, PROTIME in the last 168 hours. Cardiac  Enzymes: No results for input(s): CKTOTAL, CKMB, CKMBINDEX, TROPONINI in the last 168 hours. BNP (last 3 results) No results for input(s): PROBNP in the last 8760 hours. HbA1C: No results for input(s): HGBA1C in the last 72 hours. CBG: No results for input(s): GLUCAP in the last 168 hours. Lipid Profile: No results for input(s): CHOL, HDL, LDLCALC, TRIG, CHOLHDL, LDLDIRECT in the last 72 hours. Thyroid Function Tests: No results for input(s): TSH, T4TOTAL, FREET4, T3FREE, THYROIDAB in the last 72 hours. Anemia Panel: No results for input(s): VITAMINB12, FOLATE, FERRITIN, TIBC, IRON, RETICCTPCT in the last 72 hours.  ---------------------------------------------   Imaging  Results:    Dg Chest Portable 1 View  Result Date: 03/06/2018 CLINICAL DATA:  57 year old male with shortness of breath EXAM: PORTABLE CHEST 1 VIEW COMPARISON:  Chest radiograph dated 01/23/2018 FINDINGS: Mild cardiomegaly with mild vascular congestion. Focal area of subpleural density at the right lung base, likely scarring. There is slight blunting of the costophrenic angles, likely small pleural effusions. No pneumothorax. No acute osseous pathology. IMPRESSION: 1. Mild cardiomegaly and mild vascular congestion. No focal consolidation. 2. Trace bilateral pleural effusions. Electronically Signed   By: Anner Crete M.D.   On: 03/06/2018 01:20    My personal review of EKG: Rhythm NSR   Assessment & Plan:    Active Problems:   COPD exacerbation ([place])   Essential hypertension   Non-small cell cancer of right lung (HCC)   COPD with exacerbation ([place])   1. COPD exacerbation-start Solu-Medrol 60 mg IV every 6 hours, DuoNeb tablets every 6 hours, Mucinex 1 tablet p.o. twice daily. 2. Hypertension-blood pressure stable, continue HCTZ 3. Pulmonary vascular congestion-seen on chest x-ray, will give 1 dose of Lasix 40 mill grams IV.  Also obtain echocardiogram. 4. Non-small cell lung cancer-followed by [facility]    DVT Prophylaxis-   Lovenox   AM Labs Ordered, also please review Full Orders  Family Communication: Admission, patients condition and plan of care including tests being ordered have been discussed with the patient  who indicate understanding and agree with the plan and Code Status.  Code Status:  full code  Admission status:  observation  Time spent in minutes : 60 minutes   Oswald Hillock M.D on 03/06/2018 at 5:21 AM  Between 7am to 7pm - Pager - [phone]. After 7pm go to [email or web address] - password [facility]  [facility] - Office  [phone]

## 2018-03-06 NOTE — ED Triage Notes (Signed)
Pt arrived via [facility]. Pt C/O SOB that started yesterday. Pt has history of COPD. Pt denies pain. Pt reports using inhaler with no relief.

## 2018-03-06 NOTE — Progress Notes (Signed)
*  PRELIMINARY RESULTS* Echocardiogram 2D Echocardiogram has been performed.  Miguel Marsh 03/06/2018, 2:15 PM

## 2018-03-06 NOTE — ED Provider Notes (Signed)
[facility] EMERGENCY DEPARTMENT Provider Note   CSN: [id number] Arrival date & time: 03/06/18  0034     History   Chief Complaint Chief Complaint  Patient presents with  . Shortness of Breath    HPI Miguel Marsh is a 57 y.o. male.  Patient presents to the emergency department for evaluation of shortness of breath.  Patient has a history of COPD.  Symptoms began yesterday.  He has had progressive worsening of his shortness of breath.  He has not had any significant cough.  He has used his albuterol inhaler without relief.  Patient denies chest pain.  He has not had any fever.     Past Medical History:  Diagnosis Date  . Arthritis   . Bronchitis   . Cancer [facility])    metastatic NSCLC (followed by [facility])  . COPD (chronic obstructive pulmonary disease) ([place])   . HTN (hypertension)     Patient Active Problem List   Diagnosis Date Noted  . Acute respiratory failure ([place]) 01/23/2018  . Acute on chronic respiratory failure with hypoxia and hypercapnia ([place]) 01/23/2018  . Acute on chronic respiratory failure with hypoxia ([place]) 01/01/2018  . Obesity, Class III, BMI 40-49.9 (morbid obesity) ([place]) 01/01/2018  . COPD with exacerbation ([place]) 01/01/2018  . Essential hypertension 12/09/2015  . Non-small cell cancer of right lung ([place]) 12/09/2015  . GERD (gastroesophageal reflux disease) 12/09/2015  . COPD with acute exacerbation ([place]) 12/17/2014  . Acute on chronic respiratory failure ([place]) 12/17/2014  . Tobacco use disorder 12/17/2014  . COPD exacerbation ([place]) 12/17/2014  . Obesity 12/17/2014  . Dyspnea   . Difficulty in walking(719.7) 04/02/2013  . Hip weakness 04/02/2013    Past Surgical History:  Procedure Laterality Date  . LUNG BIOPSY    . TOTAL HIP ARTHROPLASTY    . TUMOR REMOVAL Right 12/04/2017        Home Medications    Prior to Admission medications   Medication Sig Start Date End Date Taking? Authorizing Provider  albuterol (PROVENTIL HFA;VENTOLIN HFA)  108 (90 BASE) MCG/ACT inhaler Inhale 2 puffs into the lungs every 6 (six) hours as needed for wheezing.    [provider]  albuterol (PROVENTIL) (2.5 MG/3ML) 0.083% nebulizer solution Take 2.5 mg by nebulization every 4 (four) hours as needed for wheezing or shortness of breath.  11/22/15   [provider]  cyclobenzaprine (FLEXERIL) 5 MG tablet Take 1 tablet (5 mg total) by mouth 3 (three) times daily as needed for muscle spasms. 01/28/18   [name], MD  guaiFENesin (MUCINEX) 600 MG 12 hr tablet Take 1 tablet (600 mg total) by mouth 2 (two) times daily. 12/12/15   Kathie Dike, MD  hydrochlorothiazide (MICROZIDE) 12.5 MG capsule Take 1 capsule by mouth daily. 12/02/14   [provider]  nicotine polacrilex (NICORETTE) 2 MG gum Take 2 mg by mouth as needed for smoking cessation.    [provider]  omeprazole (PRILOSEC) 40 MG capsule Take 40 mg by mouth daily. 04/06/15   [provider]  Oxycodone HCl 10 MG TABS Take 10 mg by mouth every 4 (four) hours as needed (pain).  07/12/15   [provider]  predniSONE (DELTASONE) 10 MG tablet Take 6 tablets (60 mg total) by mouth daily with breakfast. And decrease by one tablet daily 01/29/18   Tat, Shanon Brow, MD  senna-docusate (SENOKOT-S) 8.6-50 MG per tablet Take 2 tablets by mouth at bedtime as needed for mild constipation.  06/15/15   [provider]  SYMBICORT 80-4.5 MCG/ACT inhaler Inhale 2 puffs into the lungs 2 (two) times daily. 12/02/14   [provider]  tiotropium (SPIRIVA) 18 MCG inhalation capsule Place 18 mcg into inhaler and inhale daily.    [provider]    Family History No family history on file.  Social History Social History   Tobacco Use  . Smoking status: Current Every Day Smoker    Packs/day: 0.10    Types: Cigarettes  . Smokeless tobacco: Never Used  Substance Use Topics  . Alcohol use: Yes    Comment: occ  . Drug use: No    Types: Marijuana, Cocaine      Comment: former     Allergies   Patient has no known allergies.   Review of Systems Review of Systems  Respiratory: Positive for shortness of breath and wheezing.   All other systems reviewed and are negative.    Physical Exam Updated Vital Signs BP 137/88   Pulse 87   Temp 98.1 F (36.7 C) (Oral)   Resp (!) 22   Wt 113.4 kg (250 lb)   SpO2 92%   BMI 40.35 kg/m   Physical Exam  Constitutional: He is oriented to person, place, and time. He appears well-developed and well-nourished. No distress.  HENT:  Head: Normocephalic and atraumatic.  Right Ear: Hearing normal.  Left Ear: Hearing normal.  Nose: Nose normal.  Mouth/Throat: Oropharynx is clear and moist and mucous membranes are normal.  Eyes: Pupils are equal, round, and reactive to light. Conjunctivae and EOM are normal.  Neck: Normal range of motion. Neck supple.  Cardiovascular: Regular rhythm, S1 normal and S2 normal. Exam reveals no gallop and no friction rub.  No murmur heard. Pulmonary/Chest: Accessory muscle usage present. Tachypnea noted. He is in respiratory distress. He has decreased breath sounds. He has wheezes. He exhibits no tenderness.  Abdominal: Soft. Normal appearance and bowel sounds are normal. There is no hepatosplenomegaly. There is no tenderness. There is no rebound, no guarding, no tenderness at McBurney's point and negative Murphy's sign. No hernia.  Musculoskeletal: Normal range of motion.  Neurological: He is alert and oriented to person, place, and time. He has normal strength. No cranial nerve deficit or sensory deficit. Coordination normal. GCS eye subscore is 4. GCS verbal subscore is 5. GCS motor subscore is 6.  Skin: Skin is warm, dry and intact. No rash noted. No cyanosis.  Psychiatric: He has a normal mood and affect. His speech is normal and behavior is normal. Thought content normal.  Nursing note and vitals reviewed.    ED Treatments / Results  Labs (all labs ordered are  listed, but only abnormal results are displayed) Labs Reviewed  CBC - Abnormal; Notable for the following components:      Result Value   WBC 11.1 (*)    All other components within normal limits  COMPREHENSIVE METABOLIC PANEL - Abnormal; Notable for the following components:   Chloride 100 (*)    Glucose, Bld 132 (*)    All other components within normal limits  BRAIN NATRIURETIC PEPTIDE    EKG None  Radiology Dg Chest Portable 1 View  Result Date: 03/06/2018 CLINICAL DATA:  57 year old male with shortness of breath EXAM: PORTABLE CHEST 1 VIEW COMPARISON:  Chest radiograph dated 01/23/2018 FINDINGS: Mild cardiomegaly with mild vascular congestion. Focal area of subpleural density at the right lung base, likely scarring. There is slight blunting of the costophrenic angles, likely small pleural effusions. No pneumothorax. No  acute osseous pathology. IMPRESSION: 1. Mild cardiomegaly and mild vascular congestion. No focal consolidation. 2. Trace bilateral pleural effusions. Electronically Signed   By: Anner Crete M.D.   On: 03/06/2018 01:20    Procedures Procedures (including critical care time)  Medications Ordered in ED Medications  albuterol (PROVENTIL) (2.5 MG/3ML) 0.083% nebulizer solution 5 mg (5 mg Nebulization Given 03/06/18 0051)  methylPREDNISolone sodium succinate (SOLU-MEDROL) 125 mg/2 mL injection 125 mg (125 mg Intravenous Given 03/06/18 0048)     Initial Impression / Assessment and Plan / ED Course  I have reviewed the triage vital signs and the nursing notes.  Pertinent labs & imaging results that were available during my care of the patient were reviewed by me and considered in my medical decision making (see chart for details).     Patient presents to the emergency department for evaluation of difficulty breathing.  Patient has a history of COPD.  He started having difficulty breathing yesterday, worsened tonight.  He is brought to the ER by [facility] [place]  [facility].  Patient was in mild respiratory distress at arrival.  He has been administered Solu-Medrol and albuterol.  He did initially improve, but now has started wheezing again.  He only uses oxygen at night when he needs it.  Currently he is requiring supplemental oxygen, room air sats are 86% at rest.  He will therefore require hospitalization.  Final Clinical Impressions(s) / ED Diagnoses   Final diagnoses:  COPD exacerbation [facility])    ED Discharge Orders    None       [place], Gwenyth Allegra, MD 03/06/18 [phone]

## 2018-03-06 NOTE — ED Notes (Signed)
Pt 86% on RA. Pt placed back on 4L [place].

## 2018-03-06 NOTE — Care Management Obs Status (Signed)
[place] NOTIFICATION   Patient Details  Name: Miguel Marsh MRN: [id number] Date of Birth: [birth year]   Medicare Observation Status Notification Given:  Yes    Sherald Barge, RN 03/06/2018, 3:06 PM

## 2018-03-07 DIAGNOSIS — R402252 Coma scale, best verbal response, oriented, at arrival to emergency department: Secondary | ICD-10-CM | POA: Diagnosis not present

## 2018-03-07 DIAGNOSIS — R402142 Coma scale, eyes open, spontaneous, at arrival to emergency department: Secondary | ICD-10-CM | POA: Diagnosis not present

## 2018-03-07 DIAGNOSIS — F17210 Nicotine dependence, cigarettes, uncomplicated: Secondary | ICD-10-CM | POA: Diagnosis not present

## 2018-03-07 DIAGNOSIS — Z7952 Long term (current) use of systemic steroids: Secondary | ICD-10-CM | POA: Diagnosis not present

## 2018-03-07 DIAGNOSIS — R0989 Other specified symptoms and signs involving the circulatory and respiratory systems: Secondary | ICD-10-CM | POA: Diagnosis not present

## 2018-03-07 DIAGNOSIS — I10 Essential (primary) hypertension: Secondary | ICD-10-CM | POA: Diagnosis not present

## 2018-03-07 DIAGNOSIS — J441 Chronic obstructive pulmonary disease with (acute) exacerbation: Secondary | ICD-10-CM | POA: Diagnosis present

## 2018-03-07 DIAGNOSIS — C3491 Malignant neoplasm of unspecified part of right bronchus or lung: Secondary | ICD-10-CM | POA: Diagnosis not present

## 2018-03-07 DIAGNOSIS — R402362 Coma scale, best motor response, obeys commands, at arrival to emergency department: Secondary | ICD-10-CM | POA: Diagnosis not present

## 2018-03-07 DIAGNOSIS — Z9981 Dependence on supplemental oxygen: Secondary | ICD-10-CM | POA: Diagnosis not present

## 2018-03-07 DIAGNOSIS — Z809 Family history of malignant neoplasm, unspecified: Secondary | ICD-10-CM | POA: Diagnosis not present

## 2018-03-07 DIAGNOSIS — T380X5A Adverse effect of glucocorticoids and synthetic analogues, initial encounter: Secondary | ICD-10-CM | POA: Diagnosis not present

## 2018-03-07 DIAGNOSIS — D72829 Elevated white blood cell count, unspecified: Secondary | ICD-10-CM | POA: Diagnosis not present

## 2018-03-07 DIAGNOSIS — K219 Gastro-esophageal reflux disease without esophagitis: Secondary | ICD-10-CM | POA: Diagnosis not present

## 2018-03-07 DIAGNOSIS — Z96649 Presence of unspecified artificial hip joint: Secondary | ICD-10-CM | POA: Diagnosis not present

## 2018-03-07 LAB — COMPREHENSIVE METABOLIC PANEL
ALK PHOS: 80 U/L (ref 38–126)
ALT: 21 U/L (ref 17–63)
AST: 15 U/L (ref 15–41)
Albumin: 3.6 g/dL (ref 3.5–5.0)
Anion gap: 10 (ref 5–15)
BILIRUBIN TOTAL: 0.7 mg/dL (ref 0.3–1.2)
BUN: 13 mg/dL (ref 6–20)
CALCIUM: 9.4 mg/dL (ref 8.9–10.3)
CO2: 32 mmol/L (ref 22–32)
Chloride: 96 mmol/L — ABNORMAL LOW (ref 101–111)
Creatinine, Ser: 0.92 mg/dL (ref 0.61–1.24)
Glucose, Bld: 165 mg/dL — ABNORMAL HIGH (ref 65–99)
Potassium: 4 mmol/L (ref 3.5–5.1)
Sodium: 138 mmol/L (ref 135–145)
Total Protein: 7 g/dL (ref 6.5–8.1)

## 2018-03-07 LAB — CBC
HEMATOCRIT: 50.3 % (ref 39.0–52.0)
HEMOGLOBIN: 16 g/dL (ref 13.0–17.0)
MCH: 29.8 pg (ref 26.0–34.0)
MCHC: 31.8 g/dL (ref 30.0–36.0)
MCV: 93.7 fL (ref 78.0–100.0)
Platelets: 260 10*3/uL (ref 150–400)
RBC: 5.37 MIL/uL (ref 4.22–5.81)
RDW: 14 % (ref 11.5–15.5)
WBC: 19.2 10*3/uL — ABNORMAL HIGH (ref 4.0–10.5)

## 2018-03-07 MED ORDER — METHYLPREDNISOLONE SODIUM SUCC 125 MG IJ SOLR
60.0000 mg | Freq: Four times a day (QID) | INTRAMUSCULAR | Status: DC
  Administered 2018-03-07 – 2018-03-08 (×4): 60 mg via INTRAVENOUS
  Filled 2018-03-07 (×4): qty 2

## 2018-03-07 MED ORDER — DOXYCYCLINE HYCLATE 100 MG PO TABS
100.0000 mg | ORAL_TABLET | Freq: Two times a day (BID) | ORAL | Status: DC
  Administered 2018-03-07 – 2018-03-08 (×3): 100 mg via ORAL
  Filled 2018-03-07 (×3): qty 1

## 2018-03-07 MED ORDER — ALBUTEROL SULFATE (2.5 MG/3ML) 0.083% IN NEBU: 2.5000 mg | INHALATION_SOLUTION | RESPIRATORY_TRACT | Status: DC | PRN

## 2018-03-07 MED ORDER — BUDESONIDE 0.25 MG/2ML IN SUSP
0.2500 mg | Freq: Two times a day (BID) | RESPIRATORY_TRACT | Status: DC
  Administered 2018-03-07 – 2018-03-08 (×3): 0.25 mg via RESPIRATORY_TRACT
  Filled 2018-03-07 (×3): qty 2

## 2018-03-07 NOTE — Progress Notes (Signed)
PROGRESS NOTE    Miguel Marsh  ZCH:[id number] DOB: [birth year] DOA: 03/06/2018 PCP: [name], DO   Brief Narrative:   [name] a57 y.o.male,with history of COPD,hypertension, non-small cell lung cancer came to hospital with chief complaints of shortness of breath since yesterday.  He has been admitted with an acute COPD exacerbation and has been started on IV Solu-Medrol as well as DuoNeb breathing treatments.  Additionally, he is noted to have pulmonary vascular congestion for which Lasix has been given with great diuresis noted.  2D echocardiogram demonstrates LVEF 65 to 70% with grade 1 diastolic dysfunction and mild to moderate LVH.   Assessment & Plan:   Active Problems:   COPD exacerbation ([place])   Essential hypertension   Non-small cell cancer of right lung (HCC)   COPD with exacerbation ([place])   1. Acute COPD exacerbation.  Attempted weaning of steroids yesterday, but patient is still persistently dyspneic and has chest tightness.  We will continue Solu-Medrol 60 mg IV every 6 hours as well as duo nebs and have added Pulmicort.  We will also add doxycycline. 2. Leukocytosis, likely steroid-induced.  We will continue to monitor closely and start doxycycline for now. 3. Hypertension.  Stable, continue HCTZ. 4. Pulmonary vascular congestion.  Improved after Lasix administration.  Continue to monitor for persistent shortness of breath which may require further diuresis.  Repeat chest x-ray in a.m. 5. Non-small cell lung cancer.  Followed by [facility]   DVT prophylaxis: Lovenox Code Status: Full Family Communication: None at bedside Disposition Plan: Continue aggressive treatment of COPD exacerbation with high-dose IV steroids and consider further diuresis per chest x-ray.   Consultants:   None  Procedures:   None  Antimicrobials:   Doxycycline 5/9->   Subjective: Patient seen and evaluated today with ongoing cough and shortness of breath.   No acute overnight events otherwise noted.  Objective: Vitals:   03/06/18 2047 03/07/18 0112 03/07/18 0459 03/07/18 0801  BP: 140/78  135/86   Pulse: (!) 106  81   Resp: 20  18   Temp: 98.7 F (37.1 C)  97.8 F (36.6 C)   TempSrc: Oral  Oral   SpO2: 93% 93% 98% 92%  Weight:      Height:        Intake/Output Summary (Last 24 hours) at 03/07/2018 1028 Last data filed at 03/07/2018 0900 Gross per 24 hour  Intake 924.83 ml  Output -  Net 924.83 ml   Filed Weights   03/06/18 0037 03/06/18 2878  Weight: 113.4 kg (250 lb) 112.6 kg (248 lb 4.8 oz)    Examination:  General exam: Appears calm and comfortable  Respiratory system: Clear to auscultation. Respiratory effort normal.  On 3 L nasal cannula. Cardiovascular system: S1 & S2 heard, RRR. No JVD, murmurs, rubs, gallops or clicks. No pedal edema. Gastrointestinal system: Abdomen is nondistended, soft and nontender. No organomegaly or masses felt. Normal bowel sounds heard. Central nervous system: Alert and oriented. No focal neurological deficits. Extremities: Symmetric 5 x 5 power. Skin: No rashes, lesions or ulcers Psychiatry: Judgement and insight appear normal. Mood & affect appropriate.     Data Reviewed: I have personally reviewed following labs and imaging studies  CBC: Recent Labs  Lab 03/06/18 0144 03/07/18 0553  WBC 11.1* 19.2*  HGB 16.2 16.0  HCT 49.4 50.3  MCV 93.7 93.7  PLT 243 676   Basic Metabolic Panel: Recent Labs  Lab 03/06/18 0144 03/07/18 0553  NA 138 138  K 3.8  4.0  CL 100* 96*  CO2 29 32  GLUCOSE 132* 165*  BUN 9 13  CREATININE 0.93 0.92  CALCIUM 9.1 9.4   GFR: Estimated Creatinine Clearance: 105.6 mL/min (by C-G formula based on SCr of 0.92 mg/dL). Liver Function Tests: Recent Labs  Lab 03/06/18 0144 03/07/18 0553  AST 19 15  ALT 26 21  ALKPHOS 78 80  BILITOT 1.1 0.7  PROT 7.2 7.0  ALBUMIN 3.8 3.6   No results for input(s): LIPASE, AMYLASE in the last 168 hours. No  results for input(s): AMMONIA in the last 168 hours. Coagulation Profile: No results for input(s): INR, PROTIME in the last 168 hours. Cardiac Enzymes: No results for input(s): CKTOTAL, CKMB, CKMBINDEX, TROPONINI in the last 168 hours. BNP (last 3 results) No results for input(s): PROBNP in the last 8760 hours. HbA1C: No results for input(s): HGBA1C in the last 72 hours. CBG: No results for input(s): GLUCAP in the last 168 hours. Lipid Profile: No results for input(s): CHOL, HDL, LDLCALC, TRIG, CHOLHDL, LDLDIRECT in the last 72 hours. Thyroid Function Tests: No results for input(s): TSH, T4TOTAL, FREET4, T3FREE, THYROIDAB in the last 72 hours. Anemia Panel: No results for input(s): VITAMINB12, FOLATE, FERRITIN, TIBC, IRON, RETICCTPCT in the last 72 hours. Sepsis Labs: No results for input(s): PROCALCITON, LATICACIDVEN in the last 168 hours.  No results found for this or any previous visit (from the past 240 hour(s)).    Radiology Studies: Dg Chest Portable 1 View  Result Date: 03/06/2018 CLINICAL DATA:  57 year old male with shortness of breath EXAM: PORTABLE CHEST 1 VIEW COMPARISON:  Chest radiograph dated 01/23/2018 FINDINGS: Mild cardiomegaly with mild vascular congestion. Focal area of subpleural density at the right lung base, likely scarring. There is slight blunting of the costophrenic angles, likely small pleural effusions. No pneumothorax. No acute osseous pathology. IMPRESSION: 1. Mild cardiomegaly and mild vascular congestion. No focal consolidation. 2. Trace bilateral pleural effusions. Electronically Signed   By: Anner Crete M.D.   On: 03/06/2018 01:20        Scheduled Meds: . budesonide (PULMICORT) nebulizer solution  0.25 mg Nebulization BID  . enoxaparin (LOVENOX) injection  0.5 mg/kg Subcutaneous Q24H  . guaiFENesin  600 mg Oral BID  . hydrochlorothiazide  12.5 mg Oral Daily  . ipratropium-albuterol  3 mL Nebulization Q6H  . mouth rinse  15 mL Mouth Rinse  BID  . methylPREDNISolone (SOLU-MEDROL) injection  60 mg Intravenous Q6H  . pantoprazole  40 mg Oral Daily   Continuous Infusions: . sodium chloride 10 mL/hr at 03/06/18 0643     LOS: 0 days    Time spent: 30 minutes    Jazzmon Prindle Darleen Crocker, DO [facility] Pager [phone]  If 7PM-7AM, please contact night-coverage [email or web address] Password [email or web address] 03/07/2018, 10:28 AM

## 2018-03-08 ENCOUNTER — Inpatient Hospital Stay (HOSPITAL_COMMUNITY): Payer: Medicare Other

## 2018-03-08 DIAGNOSIS — J441 Chronic obstructive pulmonary disease with (acute) exacerbation: Secondary | ICD-10-CM | POA: Diagnosis not present

## 2018-03-08 LAB — CBC
HCT: 49.1 % (ref 39.0–52.0)
Hemoglobin: 15.9 g/dL (ref 13.0–17.0)
MCH: 30.1 pg (ref 26.0–34.0)
MCHC: 32.4 g/dL (ref 30.0–36.0)
MCV: 93 fL (ref 78.0–100.0)
PLATELETS: 260 10*3/uL (ref 150–400)
RBC: 5.28 MIL/uL (ref 4.22–5.81)
RDW: 13.9 % (ref 11.5–15.5)
WBC: 19.4 10*3/uL — AB (ref 4.0–10.5)

## 2018-03-08 LAB — BASIC METABOLIC PANEL
ANION GAP: 9 (ref 5–15)
BUN: 12 mg/dL (ref 6–20)
CHLORIDE: 96 mmol/L — AB (ref 101–111)
CO2: 31 mmol/L (ref 22–32)
Calcium: 9.4 mg/dL (ref 8.9–10.3)
Creatinine, Ser: 0.82 mg/dL (ref 0.61–1.24)
GFR calc Af Amer: >60 mL/min (ref >60)
GLUCOSE: 192 mg/dL — AB (ref 65–99)
POTASSIUM: 4.2 mmol/L (ref 3.5–5.1)
SODIUM: 136 mmol/L (ref 135–145)

## 2018-03-08 MED ORDER — PREDNISONE 20 MG PO TABS: 40.0000 mg | ORAL_TABLET | Freq: Every day | ORAL | 0 refills | Status: AC

## 2018-03-08 MED ORDER — DOXYCYCLINE HYCLATE 100 MG PO TABS: 100.0000 mg | ORAL_TABLET | Freq: Two times a day (BID) | ORAL | 0 refills | Status: AC

## 2018-03-08 NOTE — Discharge Summary (Signed)
Physician Discharge Summary  Miguel Marsh:[id number] DOB: 1961-02-06 DOA: 03/06/2018  PCP: [name], DO  Admit date: 03/06/2018  Discharge date: 03/08/2018  Admitted From:Home  Disposition:  Home  Recommendations for Outpatient Follow-up:  1. Follow up with PCP in 1-2 weeks  Home Health:N/A  Equipment/Devices:Has home oxygen  Discharge Condition:Stable  CODE STATUS: Full  Diet recommendation: Heart Healthy  Brief/Interim Summary:  [name] a57 y.o.male,with history of COPD,hypertension, non-small cell lung cancer came to hospital with chief complaints of shortness of breath since 03/05/18.He has been admitted with an acute COPD exacerbation and has been started on IV Solu-Medrol as well as DuoNeb breathing treatments. Additionally, he is noted to have pulmonary vascular congestion for which Lasix has been given with great diuresis noted. 2D echocardiogram demonstrates LVEF 65 to 70% with grade 1 diastolic dysfunction and mild to moderate LVH.  He is doing much better on this day of discharge after remaining on IV Solu-Medrol for a couple days.  He states he has no further chest tightness, shortness of breath, wheezing, or chest pain.  He is ambulating in the hallways without oxygen and no significant dyspnea noted.  He wears oxygen at home and has home nebulizer treatments that he will continue to use as needed.  He will be discharged on doxycycline for 5 more days as well as oral prednisone for total 7 days.   Discharge Diagnoses:  Active Problems:   COPD exacerbation ([place])   Essential hypertension   Non-small cell cancer of right lung (HCC)   COPD with exacerbation ([place])  1. Acute COPD exacerbation.    This has resolved after inpatient treatment with IV Solu-Medrol and Pulmicort.  He will remain on doxycycline and prednisone as noted above.  He has home nebulizer machine as needed as well as home oxygen with baseline use of 2 L nasal  cannula. 2. Leukocytosis, likely steroid-induced.    This remains stable and patient is afebrile. 3. Hypertension.  Stable, continue HCTZ. 4. Pulmonary vascular congestion.  Improved after Lasix administration.    No further symptomatology noted at this time. 5. Non-small cell lung cancer.  Followed by [facility].   Discharge Instructions  Discharge Instructions    Diet - low sodium heart healthy   Complete by:  As directed    Increase activity slowly   Complete by:  As directed      Allergies as of 03/08/2018   No Known Allergies     Medication List    STOP taking these medications   MULTIVITAMIN PO     TAKE these medications   acetaminophen 500 MG tablet Commonly known as:  TYLENOL Take 500 mg by mouth every 6 (six) hours as needed for mild pain.   albuterol 108 (90 Base) MCG/ACT inhaler Commonly known as:  PROVENTIL HFA;VENTOLIN HFA Inhale 2 puffs into the lungs every 6 (six) hours as needed for wheezing.   albuterol (2.5 MG/3ML) 0.083% nebulizer solution Commonly known as:  PROVENTIL Take 2.5 mg by nebulization every 4 (four) hours as needed for wheezing or shortness of breath.   doxycycline 100 MG tablet Commonly known as:  VIBRA-TABS Take 1 tablet (100 mg total) by mouth every 12 (twelve) hours for 5 days.   guaiFENesin 600 MG 12 hr tablet Commonly known as:  MUCINEX Take 1 tablet (600 mg total) by mouth 2 (two) times daily.   hydrochlorothiazide 25 MG tablet Commonly known as:  HYDRODIURIL Take 25 mg by mouth daily.   nicotine polacrilex 2  MG gum Commonly known as:  NICORETTE Take 2 mg by mouth as needed for smoking cessation.   Oxycodone HCl 10 MG Tabs Take 10 mg by mouth every 4 (four) hours as needed (pain).   predniSONE 20 MG tablet Commonly known as:  DELTASONE Take 2 tablets (40 mg total) by mouth daily for 7 days.   PRILOSEC 40 MG capsule Generic drug:  omeprazole Take 40 mg by mouth daily.   senna-docusate 8.6-50 MG tablet Commonly  known as:  Senokot-S Take 2 tablets by mouth at bedtime as needed for mild constipation.   SYMBICORT 80-4.5 MCG/ACT inhaler Generic drug:  budesonide-formoterol Inhale 2 puffs into the lungs 2 (two) times daily.   tiotropium 18 MCG inhalation capsule Commonly known as:  SPIRIVA Place 18 mcg into inhaler and inhale daily.      Follow-up Information    [name], DO Follow up in 1 week(s).   Contact information: [place] [address] [phone]          No Known Allergies  Consultations:  None   Procedures/Studies: Dg Chest Portable 1 View  Result Date: 03/06/2018 CLINICAL DATA:  57 year old male with shortness of breath EXAM: PORTABLE CHEST 1 VIEW COMPARISON:  Chest radiograph dated 01/23/2018 FINDINGS: Mild cardiomegaly with mild vascular congestion. Focal area of subpleural density at the right lung base, likely scarring. There is slight blunting of the costophrenic angles, likely small pleural effusions. No pneumothorax. No acute osseous pathology. IMPRESSION: 1. Mild cardiomegaly and mild vascular congestion. No focal consolidation. 2. Trace bilateral pleural effusions. Electronically Signed   By: Anner Crete M.D.   On: 03/06/2018 01:20    Discharge Exam: Vitals:   03/08/18 0628 03/08/18 0729  BP: 129/76   Pulse: 88   Resp: 15   Temp: 98.3 F (36.8 C)   SpO2: 97% 97%   Vitals:   03/07/18 2247 03/08/18 0100 03/08/18 0628 03/08/18 0729  BP: 135/80  129/76   Pulse: (!) 105  88   Resp: 18  15   Temp: 98.4 F (36.9 C)  98.3 F (36.8 C)   TempSrc: Oral  Oral   SpO2: 90% 92% 97% 97%  Weight:      Height:        General: Pt is alert, awake, not in acute distress Cardiovascular: RRR, S1/S2 +, no rubs, no gallops Respiratory: CTA bilaterally, no wheezing, no rhonchi Abdominal: Soft, NT, ND, bowel sounds + Extremities: no edema, no cyanosis    The results of significant diagnostics from this hospitalization (including imaging,  microbiology, ancillary and laboratory) are listed below for reference.     Microbiology: No results found for this or any previous visit (from the past 240 hour(s)).   Labs: BNP (last 3 results) Recent Labs    12/31/17 1229 03/06/18 0144  BNP 33.0 60.4   Basic Metabolic Panel: Recent Labs  Lab 03/06/18 0144 03/07/18 0553 03/08/18 0431  NA 138 138 136  K 3.8 4.0 4.2  CL 100* 96* 96*  CO2 29 32 31  GLUCOSE 132* 165* 192*  BUN 9 13 12   CREATININE 0.93 0.92 0.82  CALCIUM 9.1 9.4 9.4   Liver Function Tests: Recent Labs  Lab 03/06/18 0144 03/07/18 0553  AST 19 15  ALT 26 21  ALKPHOS 78 80  BILITOT 1.1 0.7  PROT 7.2 7.0  ALBUMIN 3.8 3.6   No results for input(s): LIPASE, AMYLASE in the last 168 hours. No results for input(s): AMMONIA in  the last 168 hours. CBC: Recent Labs  Lab 03/06/18 0144 03/07/18 0553 03/08/18 0431  WBC 11.1* 19.2* 19.4*  HGB 16.2 16.0 15.9  HCT 49.4 50.3 49.1  MCV 93.7 93.7 93.0  PLT 243 260 260   Cardiac Enzymes: No results for input(s): CKTOTAL, CKMB, CKMBINDEX, TROPONINI in the last 168 hours. BNP: Invalid input(s): POCBNP CBG: No results for input(s): GLUCAP in the last 168 hours. D-Dimer No results for input(s): DDIMER in the last 72 hours. Hgb A1c No results for input(s): HGBA1C in the last 72 hours. Lipid Profile No results for input(s): CHOL, HDL, LDLCALC, TRIG, CHOLHDL, LDLDIRECT in the last 72 hours. Thyroid function studies No results for input(s): TSH, T4TOTAL, T3FREE, THYROIDAB in the last 72 hours.  Invalid input(s): FREET3 Anemia work up No results for input(s): VITAMINB12, FOLATE, FERRITIN, TIBC, IRON, RETICCTPCT in the last 72 hours. Urinalysis    Component Value Date/Time   COLORURINE YELLOW 01/22/2017 1524   APPEARANCEUR CLOUDY (A) 01/22/2017 1524   LABSPEC 1.021 01/22/2017 1524   PHURINE 7.0 01/22/2017 1524   GLUCOSEU NEGATIVE 01/22/2017 1524   HGBUR NEGATIVE 01/22/2017 1524   [place]  01/22/2017 1524   KETONESUR NEGATIVE 01/22/2017 1524   PROTEINUR NEGATIVE 01/22/2017 1524   UROBILINOGEN 0.2 10/21/2013 0200   NITRITE NEGATIVE 01/22/2017 1524   LEUKOCYTESUR NEGATIVE 01/22/2017 1524   Sepsis Labs Invalid input(s): PROCALCITONIN,  WBC,  LACTICIDVEN Microbiology No results found for this or any previous visit (from the past 240 hour(s)).   Time coordinating discharge: 35 minutes  SIGNED:   Rodena Goldmann, DO [facility] 03/08/2018, 9:49 AM Pager [phone]  If 7PM-7AM, please contact night-coverage [email or web address] Password [email or web address]

## 2018-03-08 NOTE — Progress Notes (Signed)
Patient discharged home with prescriptions and IV removed and site intact.

## 2018-03-08 NOTE — Care Management Important Message (Signed)
Important Message  Patient Details  Name: STEPHANE NIEMANN MRN: [id number] Date of Birth: 1961-01-14   Medicare Important Message Given:  Yes    Natalio Salois, Chauncey Reading, RN 03/08/2018, 10:10 AM

## 2018-03-18 ENCOUNTER — Emergency Department (HOSPITAL_COMMUNITY): Payer: Medicare HMO

## 2018-03-18 ENCOUNTER — Inpatient Hospital Stay (HOSPITAL_COMMUNITY)
Admission: EM | Admit: 2018-03-18 | Discharge: 2018-03-22 | DRG: 190 | Disposition: A | Discharge status: Home / Self Care | Payer: Medicare HMO | Attending: Internal Medicine | Admitting: Internal Medicine

## 2018-03-18 ENCOUNTER — Encounter (HOSPITAL_COMMUNITY): Payer: Self-pay

## 2018-03-18 ENCOUNTER — Other Ambulatory Visit: Payer: Self-pay

## 2018-03-18 DIAGNOSIS — T380X5A Adverse effect of glucocorticoids and synthetic analogues, initial encounter: Secondary | ICD-10-CM | POA: Diagnosis present

## 2018-03-18 DIAGNOSIS — R0602 Shortness of breath: Secondary | ICD-10-CM

## 2018-03-18 DIAGNOSIS — J9621 Acute and chronic respiratory failure with hypoxia: Secondary | ICD-10-CM | POA: Diagnosis present

## 2018-03-18 DIAGNOSIS — C3491 Malignant neoplasm of unspecified part of right bronchus or lung: Secondary | ICD-10-CM

## 2018-03-18 DIAGNOSIS — Z902 Acquired absence of lung [part of]: Secondary | ICD-10-CM | POA: Diagnosis not present

## 2018-03-18 DIAGNOSIS — Z79899 Other long term (current) drug therapy: Secondary | ICD-10-CM

## 2018-03-18 DIAGNOSIS — R739 Hyperglycemia, unspecified: Secondary | ICD-10-CM | POA: Diagnosis present

## 2018-03-18 DIAGNOSIS — Z6841 Body Mass Index (BMI) 40.0 and over, adult: Secondary | ICD-10-CM

## 2018-03-18 DIAGNOSIS — Z7951 Long term (current) use of inhaled steroids: Secondary | ICD-10-CM | POA: Diagnosis not present

## 2018-03-18 DIAGNOSIS — J9622 Acute and chronic respiratory failure with hypercapnia: Secondary | ICD-10-CM | POA: Diagnosis present

## 2018-03-18 DIAGNOSIS — K219 Gastro-esophageal reflux disease without esophagitis: Secondary | ICD-10-CM | POA: Diagnosis present

## 2018-03-18 DIAGNOSIS — R0600 Dyspnea, unspecified: Secondary | ICD-10-CM | POA: Diagnosis present

## 2018-03-18 DIAGNOSIS — I10 Essential (primary) hypertension: Secondary | ICD-10-CM | POA: Diagnosis present

## 2018-03-18 DIAGNOSIS — F17210 Nicotine dependence, cigarettes, uncomplicated: Secondary | ICD-10-CM | POA: Diagnosis present

## 2018-03-18 DIAGNOSIS — Z85118 Personal history of other malignant neoplasm of bronchus and lung: Secondary | ICD-10-CM

## 2018-03-18 DIAGNOSIS — J441 Chronic obstructive pulmonary disease with (acute) exacerbation: Principal | ICD-10-CM | POA: Diagnosis present

## 2018-03-18 LAB — CBC WITH DIFFERENTIAL/PLATELET
BASOS ABS: 0 10*3/uL (ref 0.0–0.1)
BASOS PCT: 0 %
Eosinophils Absolute: 0 10*3/uL (ref 0.0–0.7)
Eosinophils Relative: 0 %
HEMATOCRIT: 51 % (ref 39.0–52.0)
HEMOGLOBIN: 16 g/dL (ref 13.0–17.0)
Lymphocytes Relative: 25 %
Lymphs Abs: 2.5 10*3/uL (ref 0.7–4.0)
MCH: 30.5 pg (ref 26.0–34.0)
MCHC: 31.4 g/dL (ref 30.0–36.0)
MCV: 97.1 fL (ref 78.0–100.0)
MONOS PCT: 11 %
Monocytes Absolute: 1 10*3/uL (ref 0.1–1.0)
NEUTROS ABS: 6.4 10*3/uL (ref 1.7–7.7)
NEUTROS PCT: 64 %
Platelets: 207 10*3/uL (ref 150–400)
RBC: 5.25 MIL/uL (ref 4.22–5.81)
RDW: 12.9 % (ref 11.5–15.5)
WBC: 9.9 10*3/uL (ref 4.0–10.5)

## 2018-03-18 LAB — BASIC METABOLIC PANEL
Anion gap: 3 — ABNORMAL LOW (ref 5–15)
BUN: 9 mg/dL (ref 6–20)
CHLORIDE: 94 mmol/L — AB (ref 101–111)
CO2: 40 mmol/L — AB (ref 22–32)
Calcium: 8.9 mg/dL (ref 8.9–10.3)
Creatinine, Ser: 0.84 mg/dL (ref 0.61–1.24)
GFR calc non Af Amer: >60 mL/min (ref >60)
GLUCOSE: 116 mg/dL — AB (ref 65–99)
Potassium: 4 mmol/L (ref 3.5–5.1)
Sodium: 137 mmol/L (ref 135–145)

## 2018-03-18 LAB — BRAIN NATRIURETIC PEPTIDE: B NATRIURETIC PEPTIDE 5: 23 pg/mL (ref 0.0–100.0)

## 2018-03-18 LAB — TROPONIN I: Troponin I: <0.03 ng/mL (ref <0.03)

## 2018-03-18 MED ORDER — GUAIFENESIN ER 600 MG PO TB12
600.0000 mg | ORAL_TABLET | Freq: Two times a day (BID) | ORAL | Status: DC
  Administered 2018-03-18 – 2018-03-22 (×7): 600 mg via ORAL
  Filled 2018-03-18 (×7): qty 1

## 2018-03-18 MED ORDER — METHYLPREDNISOLONE SODIUM SUCC 125 MG IJ SOLR
80.0000 mg | Freq: Two times a day (BID) | INTRAMUSCULAR | Status: DC
  Administered 2018-03-19 – 2018-03-21 (×5): 80 mg via INTRAVENOUS
  Filled 2018-03-18 (×5): qty 2

## 2018-03-18 MED ORDER — ALBUTEROL SULFATE (2.5 MG/3ML) 0.083% IN NEBU: 2.5000 mg | INHALATION_SOLUTION | Freq: Four times a day (QID) | RESPIRATORY_TRACT | Status: DC

## 2018-03-18 MED ORDER — SODIUM CHLORIDE 0.9% FLUSH
3.0000 mL | Freq: Two times a day (BID) | INTRAVENOUS | Status: DC
  Administered 2018-03-19 – 2018-03-22 (×6): 3 mL via INTRAVENOUS

## 2018-03-18 MED ORDER — SODIUM CHLORIDE 0.9% FLUSH: 3.0000 mL | INTRAVENOUS | Status: DC | PRN

## 2018-03-18 MED ORDER — IPRATROPIUM BROMIDE 0.02 % IN SOLN: 0.5000 mg | Freq: Four times a day (QID) | RESPIRATORY_TRACT | Status: DC

## 2018-03-18 MED ORDER — ALBUTEROL (5 MG/ML) CONTINUOUS INHALATION SOLN
10.0000 mg/h | INHALATION_SOLUTION | Freq: Once | RESPIRATORY_TRACT | Status: AC
  Administered 2018-03-18: 10 mg/h via RESPIRATORY_TRACT
  Filled 2018-03-18: qty 20

## 2018-03-18 MED ORDER — IPRATROPIUM BROMIDE 0.02 % IN SOLN
0.5000 mg | Freq: Once | RESPIRATORY_TRACT | Status: AC
  Administered 2018-03-18: 0.5 mg via RESPIRATORY_TRACT
  Filled 2018-03-18: qty 2.5

## 2018-03-18 MED ORDER — HYDROCHLOROTHIAZIDE 25 MG PO TABS
25.0000 mg | ORAL_TABLET | Freq: Every day | ORAL | Status: DC
  Administered 2018-03-18 – 2018-03-22 (×5): 25 mg via ORAL
  Filled 2018-03-18 (×5): qty 1

## 2018-03-18 MED ORDER — ALBUTEROL SULFATE (2.5 MG/3ML) 0.083% IN NEBU: 2.5000 mg | INHALATION_SOLUTION | RESPIRATORY_TRACT | Status: DC | PRN

## 2018-03-18 MED ORDER — FUROSEMIDE 10 MG/ML IJ SOLN
40.0000 mg | Freq: Once | INTRAMUSCULAR | Status: AC
  Administered 2018-03-18: 40 mg via INTRAVENOUS
  Filled 2018-03-18: qty 4

## 2018-03-18 MED ORDER — NICOTINE POLACRILEX 2 MG MT GUM
2.0000 mg | CHEWING_GUM | OROMUCOSAL | Status: DC | PRN
  Filled 2018-03-18: qty 1

## 2018-03-18 MED ORDER — AZITHROMYCIN 250 MG PO TABS
250.0000 mg | ORAL_TABLET | Freq: Every day | ORAL | Status: DC
  Administered 2018-03-19 – 2018-03-20 (×2): 250 mg via ORAL
  Filled 2018-03-18 (×3): qty 1

## 2018-03-18 MED ORDER — SODIUM CHLORIDE 0.9 % IV SOLN: 250.0000 mL | INTRAVENOUS | Status: DC | PRN

## 2018-03-18 MED ORDER — AZITHROMYCIN 250 MG PO TABS
500.0000 mg | ORAL_TABLET | Freq: Every day | ORAL | Status: AC
  Administered 2018-03-18: 500 mg via ORAL
  Filled 2018-03-18: qty 2

## 2018-03-18 MED ORDER — IPRATROPIUM-ALBUTEROL 0.5-2.5 (3) MG/3ML IN SOLN
3.0000 mL | Freq: Four times a day (QID) | RESPIRATORY_TRACT | Status: DC
  Administered 2018-03-18 – 2018-03-22 (×15): 3 mL via RESPIRATORY_TRACT
  Filled 2018-03-18 (×15): qty 3

## 2018-03-18 MED ORDER — METHYLPREDNISOLONE SODIUM SUCC 125 MG IJ SOLR
125.0000 mg | Freq: Once | INTRAMUSCULAR | Status: AC
  Administered 2018-03-18: 125 mg via INTRAVENOUS
  Filled 2018-03-18: qty 2

## 2018-03-18 MED ORDER — SENNOSIDES-DOCUSATE SODIUM 8.6-50 MG PO TABS: 2.0000 | ORAL_TABLET | Freq: Every evening | ORAL | Status: DC | PRN

## 2018-03-18 MED ORDER — OXYCODONE HCL 5 MG PO TABS
10.0000 mg | ORAL_TABLET | ORAL | Status: DC | PRN
  Administered 2018-03-18 – 2018-03-21 (×7): 10 mg via ORAL
  Filled 2018-03-18 (×7): qty 2

## 2018-03-18 MED ORDER — ACETAMINOPHEN 500 MG PO TABS: 500.0000 mg | ORAL_TABLET | Freq: Four times a day (QID) | ORAL | Status: DC | PRN

## 2018-03-18 NOTE — ED Triage Notes (Signed)
Ems reports pt c/o sob.   EMS gave 1 albuterol neb pta.  Pt usually takes 1 albuterol every 4 hours and hasn't had a treatment today.  Reports was recently admitted for sob.  Pt c/o pain in r side.  Denies any chest pain, n/v.  Denies swelling in feet and legs.

## 2018-03-18 NOTE — ED Provider Notes (Signed)
[facility] EMERGENCY DEPARTMENT Provider Note   CSN: [id number] Arrival date & time: 03/18/18  1133     History   Chief Complaint Chief Complaint  Patient presents with  . Shortness of Breath    HPI Miguel Marsh is a 57 y.o. male.  Complaint is difficulty breathing.  HPI; 57 year old male.  Smoker.  States he has not smoked for "4 days".  Admitted recently with COPD exacerbation.  Is been home for a few weeks.  Discharged with prednisone and doxycycline.  Finished these within the last week.  Within 2 to 3 days of finishing felt like he was continuing to get worse again.  Has home oxygen that he wears "sometimes" wears at at night.  Is been wearing his oxygen continuously for 4 days.  He has home nebulizer.  Uses it several times per day.  Slept poorly.  Upon awakening he was short of breath and called EMS.  Given nebulized albuterol in route.  Past Medical History:  Diagnosis Date  . Arthritis   . Bronchitis   . Cancer [facility])    metastatic NSCLC (followed by [facility])  . COPD (chronic obstructive pulmonary disease) ([place])   . HTN (hypertension)     Patient Active Problem List   Diagnosis Date Noted  . Acute respiratory failure ([place]) 01/23/2018  . Acute on chronic respiratory failure with hypoxia and hypercapnia ([place]) 01/23/2018  . Acute on chronic respiratory failure with hypoxia ([place]) 01/01/2018  . Obesity, Class III, BMI 40-49.9 (morbid obesity) ([place]) 01/01/2018  . COPD with exacerbation ([place]) 01/01/2018  . Essential hypertension 12/09/2015  . Non-small cell cancer of right lung ([place]) 12/09/2015  . GERD (gastroesophageal reflux disease) 12/09/2015  . COPD with acute exacerbation ([place]) 12/17/2014  . Acute on chronic respiratory failure ([place]) 12/17/2014  . Tobacco use disorder 12/17/2014  . COPD exacerbation ([place]) 12/17/2014  . Obesity 12/17/2014  . Dyspnea   . Difficulty in walking(719.7) 04/02/2013  . Hip weakness 04/02/2013    Past Surgical History:  Procedure  Laterality Date  . LUNG BIOPSY    . TOTAL HIP ARTHROPLASTY    . TUMOR REMOVAL Right 12/04/2017        Home Medications    Prior to Admission medications   Medication Sig Start Date End Date Taking? Authorizing Provider  acetaminophen (TYLENOL) 500 MG tablet Take 500 mg by mouth every 6 (six) hours as needed for mild pain.   Yes [provider]  albuterol (PROVENTIL HFA;VENTOLIN HFA) 108 (90 BASE) MCG/ACT inhaler Inhale 2 puffs into the lungs every 6 (six) hours as needed for wheezing.   Yes [provider]  albuterol (PROVENTIL) (2.5 MG/3ML) 0.083% nebulizer solution Take 2.5 mg by nebulization every 4 (four) hours as needed for wheezing or shortness of breath.  11/22/15  Yes [provider]  guaiFENesin (MUCINEX) 600 MG 12 hr tablet Take 1 tablet (600 mg total) by mouth 2 (two) times daily. 12/12/15  Yes Kathie Dike, MD  hydrochlorothiazide (HYDRODIURIL) 25 MG tablet Take 25 mg by mouth daily.  12/02/14  Yes [provider]  nicotine polacrilex (NICORETTE) 2 MG gum Take 2 mg by mouth as needed for smoking cessation.   Yes [provider]  omeprazole (PRILOSEC) 40 MG capsule Take 40 mg by mouth daily. 04/06/15  Yes [provider]  Oxycodone HCl 10 MG TABS Take 10 mg by mouth every 4 (four) hours as needed (pain).  07/12/15  Yes [provider]  senna-docusate (SENOKOT-S) 8.6-50 MG  per tablet Take 2 tablets by mouth at bedtime as needed for mild constipation.  06/15/15  Yes [provider]  SYMBICORT 80-4.5 MCG/ACT inhaler Inhale 2 puffs into the lungs 2 (two) times daily. 12/02/14  Yes [provider]  tiotropium (SPIRIVA) 18 MCG inhalation capsule Place 18 mcg into inhaler and inhale daily.   Yes [provider]    Family History No family history on file.  Social History Social History   Tobacco Use  . Smoking status: Current Every Day Smoker    Packs/day: 0.10    Types: Cigarettes  . Smokeless  tobacco: Never Used  Substance Use Topics  . Alcohol use: Yes    Comment: occ  . Drug use: No    Types: Marijuana, Cocaine    Comment: former     Allergies   Patient has no known allergies.   Review of Systems Review of Systems  Constitutional: Negative for appetite change, chills, diaphoresis, fatigue and fever.  HENT: Negative for mouth sores, sore throat and trouble swallowing.   Eyes: Negative for visual disturbance.  Respiratory: Positive for cough, shortness of breath and wheezing. Negative for chest tightness.   Cardiovascular: Negative for chest pain.  Gastrointestinal: Negative for abdominal distention, abdominal pain, diarrhea, nausea and vomiting.  Endocrine: Negative for polydipsia, polyphagia and polyuria.  Genitourinary: Negative for dysuria, frequency and hematuria.  Musculoskeletal: Negative for gait problem.  Skin: Negative for color change, pallor and rash.  Neurological: Negative for dizziness, syncope, light-headedness and headaches.  Hematological: Does not bruise/bleed easily.  Psychiatric/Behavioral: Negative for behavioral problems and confusion.     Physical Exam Updated Vital Signs BP 131/79   Pulse 85   Temp 98 F (36.7 C) (Oral)   Resp 13   Ht 5\' 6"  (1.676 m)   Wt 113.4 kg (250 lb)   SpO2 91%   BMI 40.35 kg/m   Physical Exam  Constitutional: He is oriented to person, place, and time. He appears well-developed. No distress.  Awake alert.  Pursed lip breathing.  Speaking 3-4 words at a time.  Dyspneic.  HENT:  Head: Normocephalic.  Eyes: Pupils are equal, round, and reactive to light. Conjunctivae are normal. No scleral icterus.  Neck: Normal range of motion. Neck supple. No thyromegaly present.  Cardiovascular: Normal rate and regular rhythm. Exam reveals no gallop and no friction rub.  No murmur heard. Pulmonary/Chest: Effort normal. No respiratory distress. He has decreased breath sounds. He has wheezes. He has no rales.  Globally  diminished breath sounds of wheezing and prolongation.  No asymmetry.  No crackles or rales.  No dependent edema.  Abdominal: Soft. Bowel sounds are normal. He exhibits no distension. There is no tenderness. There is no rebound.  Musculoskeletal: Normal range of motion.  Neurological: He is alert and oriented to person, place, and time.  Skin: Skin is warm and dry. No rash noted.  Psychiatric: He has a normal mood and affect. His behavior is normal.     ED Treatments / Results  Labs (all labs ordered are listed, but only abnormal results are displayed) Labs Reviewed  BASIC METABOLIC PANEL - Abnormal; Notable for the following components:      Result Value   Chloride 94 (*)    CO2 40 (*)    Glucose, Bld 116 (*)    Anion gap 3 (*)    All other components within normal limits  CBC WITH DIFFERENTIAL/PLATELET  TROPONIN I  BRAIN NATRIURETIC PEPTIDE    EKG None  Radiology Dg Chest 2 View  Result Date: 03/18/2018 CLINICAL DATA:  Shortness of breath for 2 weeks. Right-sided chest pain. History of COPD. History of lung cancer. EXAM: CHEST - 2 VIEW COMPARISON:  03/08/2018 FINDINGS: The cardiomediastinal silhouette is unchanged and within normal limits. Peribronchial thickening is similar to the prior study. There is chronic blunting of the right lateral costophrenic angle which is unchanged. No sizable pleural effusion, confluent airspace opacity, overt pulmonary edema, or pneumothorax is identified. An old left lateral ninth rib fracture is noted. IMPRESSION: Chronic bronchitic changes. Electronically Signed   By: Logan Bores M.D.   On: 03/18/2018 14:03    Procedures Procedures (including critical care time)  Medications Ordered in ED Medications  furosemide (LASIX) injection 40 mg (has no administration in time range)  methylPREDNISolone sodium succinate (SOLU-MEDROL) 125 mg/2 mL injection 125 mg (125 mg Intravenous Given 03/18/18 1254)  albuterol (PROVENTIL,VENTOLIN) solution  continuous neb (10 mg/hr Nebulization Given 03/18/18 1223)  ipratropium (ATROVENT) nebulizer solution 0.5 mg (0.5 mg Nebulization Given 03/18/18 1223)  albuterol (PROVENTIL,VENTOLIN) solution continuous neb (10 mg/hr Nebulization Given 03/18/18 1426)     Initial Impression / Assessment and Plan / ED Course  I have reviewed the triage vital signs and the nursing notes.  Pertinent labs & imaging results that were available during my care of the patient were reviewed by me and considered in my medical decision making (see chart for details).   Likely COPD exacerbation.  He does not have increased sputum production or hemoptysis.  No fevers.  No pain or edema.  No history of cardiac disease.  During his last admission he did diurese after his chest x-ray was read as pulmonary congestion.   1400: After EMS nebulized albuterol, and 1 hour of 10 mg continuous nebulized albuterol patient states he "does not feel any better".  He continues to have wheezing and prolongation and pursed lipped breathing.  He is 84% on room air.  He is 92% on 2 L.  He does not appear fatigued.  He is mentating well.  X-ray does not show CHF or obvious infiltrate.  Plan will be additional nebulized albuterol and reevaluation.  If not improving may require admission.  14:53: After second 1 hour nebulized albuterol continuous treatment patient still bronchospastic.  Frequent cough.  Actually on exam moving better air but still prolonged and dyspneic.  Does not appear fatigued.  Troponin and EKGs do not show significant abnormalities.  Will discuss with hospitalist regarding admission   CRITICAL CARE Performed by: Lolita Patella   Total critical care time: 30 minutes  Critical care time was exclusive of separately billable procedures and treating other patients.  Critical care was necessary to treat or prevent imminent or life-threatening deterioration.  Critical care was time spent personally by me on the following  activities: development of treatment plan with patient and/or surrogate as well as nursing, discussions with consultants, evaluation of patient's response to treatment, examination of patient, obtaining history from patient or surrogate, ordering and performing treatments and interventions, ordering and review of laboratory studies, ordering and review of radiographic studies, pulse oximetry and re-evaluation of patient's condition.  CRITICAL CARE Performed by: Lolita Patella   Total critical care time: 30 minutes  Critical care time was exclusive of separately billable procedures and treating other patients.  Critical care was necessary to treat or prevent imminent or life-threatening deterioration.  Critical care was time spent personally by me on the following activities: development of treatment plan with  patient and/or surrogate as well as nursing, discussions with consultants, evaluation of patient's response to treatment, examination of patient, obtaining history from patient or surrogate, ordering and performing treatments and interventions, ordering and review of laboratory studies, ordering and review of radiographic studies, pulse oximetry and re-evaluation of patient's condition.   Final Clinical Impressions(s) / ED Diagnoses   Final diagnoses:  COPD exacerbation [facility])    ED Discharge Orders    None       Tanna Furry, MD 03/18/18 1511

## 2018-03-18 NOTE — ED Notes (Signed)
Pt ambulated to rest room and had desaturation to 85% on room air.  Placed back on oxygen and instructed to use urinal for further needs.

## 2018-03-18 NOTE — H&P (Signed)
History and Physical    Miguel Marsh:[id number] DOB: December 25, 1960 DOA: 03/18/2018  PCP: [name], DO  Patient coming from: Home  Chief Complaint: Shortness of breath and wheezing  HPI: Miguel Marsh is a 57 y.o. male with medical history significant of COPD, hypertension comes in with several days of worsening shortness of breath and wheezing.  He denies fevers but has been having cough.  He denies any lower extremity edema.  Patient reports he does not use his albuterol inhaler at home like he supposed to but he has been using his nebulizer.  Patient found to be in COPD exacerbation with hypoxia referred for admission for such.  Patient is only on oxygen at night but has been using it 24 hours a day for the last several days.  Review of Systems: As per HPI otherwise 10 point review of systems negative.   Past Medical History:  Diagnosis Date  . Arthritis   . Bronchitis   . Cancer [facility])    metastatic NSCLC (followed by [facility])  . COPD (chronic obstructive pulmonary disease) ([place])   . HTN (hypertension)     Past Surgical History:  Procedure Laterality Date  . LUNG BIOPSY    . TOTAL HIP ARTHROPLASTY    . TUMOR REMOVAL Right 12/04/2017     reports that he has been smoking cigarettes.  He has been smoking about 0.10 packs per day. He has never used smokeless tobacco. He reports that he drinks alcohol. He reports that he does not use drugs.  No Known Allergies  No family history on file.  No premature coronary artery disease  Prior to Admission medications   Medication Sig Start Date End Date Taking? Authorizing Provider  acetaminophen (TYLENOL) 500 MG tablet Take 500 mg by mouth every 6 (six) hours as needed for mild pain.   Yes [provider]  albuterol (PROVENTIL HFA;VENTOLIN HFA) 108 (90 BASE) MCG/ACT inhaler Inhale 2 puffs into the lungs every 6 (six) hours as needed for wheezing.   Yes [provider]  albuterol (PROVENTIL) (2.5 MG/3ML) 0.083%  nebulizer solution Take 2.5 mg by nebulization every 4 (four) hours as needed for wheezing or shortness of breath.  11/22/15  Yes [provider]  guaiFENesin (MUCINEX) 600 MG 12 hr tablet Take 1 tablet (600 mg total) by mouth 2 (two) times daily. 12/12/15  Yes Kathie Dike, MD  hydrochlorothiazide (HYDRODIURIL) 25 MG tablet Take 25 mg by mouth daily.  12/02/14  Yes [provider]  nicotine polacrilex (NICORETTE) 2 MG gum Take 2 mg by mouth as needed for smoking cessation.   Yes [provider]  omeprazole (PRILOSEC) 40 MG capsule Take 40 mg by mouth daily. 04/06/15  Yes [provider]  Oxycodone HCl 10 MG TABS Take 10 mg by mouth every 4 (four) hours as needed (pain).  07/12/15  Yes [provider]  senna-docusate (SENOKOT-S) 8.6-50 MG per tablet Take 2 tablets by mouth at bedtime as needed for mild constipation.  06/15/15  Yes [provider]  SYMBICORT 80-4.5 MCG/ACT inhaler Inhale 2 puffs into the lungs 2 (two) times daily. 12/02/14  Yes [provider]  tiotropium (SPIRIVA) 18 MCG inhalation capsule Place 18 mcg into inhaler and inhale daily.   Yes [provider]    Physical Exam: Vitals:   03/18/18 1300 03/18/18 1315 03/18/18 1330 03/18/18 1426  BP: (!) 149/83  131/79   Pulse: 75 80 85   Resp: 11 17 13    Temp:  TempSrc:      SpO2: 100% 100% 94% 91%  Weight:      Height:        Constitutional: NAD, calm, comfortable Vitals:   03/18/18 1300 03/18/18 1315 03/18/18 1330 03/18/18 1426  BP: (!) 149/83  131/79   Pulse: 75 80 85   Resp: 11 17 13    Temp:      TempSrc:      SpO2: 100% 100% 94% 91%  Weight:      Height:       Eyes: PERRL, lids and conjunctivae normal ENMT: Mucous membranes are moist. Posterior pharynx clear of any exudate or lesions.Normal dentition.  Neck: normal, supple, no masses, no thyromegaly Respiratory: clear to auscultation bilaterally, mild wheezing, no crackles. Normal respiratory  effort. No accessory muscle use.  Cardiovascular: Regular rate and rhythm, no murmurs / rubs / gallops. No extremity edema. 2+ pedal pulses. No carotid bruits.  Abdomen: no tenderness, no masses palpated. No hepatosplenomegaly. Bowel sounds positive.  Musculoskeletal: no clubbing / cyanosis. No joint deformity upper and lower extremities. Good ROM, no contractures. Normal muscle tone.  Skin: no rashes, lesions, ulcers. No induration Neurologic: CN 2-12 grossly intact. Sensation intact, DTR normal. Strength 5/5 in all 4.  Psychiatric: Normal judgment and insight. Alert and oriented x 3. Normal mood.    Labs on Admission: I have personally reviewed following labs and imaging studies  CBC: Recent Labs  Lab 03/18/18 1156  WBC 9.9  NEUTROABS 6.4  HGB 16.0  HCT 51.0  MCV 97.1  PLT 166   Basic Metabolic Panel: Recent Labs  Lab 03/18/18 1156  NA 137  K 4.0  CL 94*  CO2 40*  GLUCOSE 116*  BUN 9  CREATININE 0.84  CALCIUM 8.9   GFR: Estimated Creatinine Clearance: 116.1 mL/min (by C-G formula based on SCr of 0.84 mg/dL). Liver Function Tests: No results for input(s): AST, ALT, ALKPHOS, BILITOT, PROT, ALBUMIN in the last 168 hours. No results for input(s): LIPASE, AMYLASE in the last 168 hours. No results for input(s): AMMONIA in the last 168 hours. Coagulation Profile: No results for input(s): INR, PROTIME in the last 168 hours. Cardiac Enzymes: Recent Labs  Lab 03/18/18 1408  TROPONINI <0.03   BNP (last 3 results) No results for input(s): PROBNP in the last 8760 hours. HbA1C: No results for input(s): HGBA1C in the last 72 hours. CBG: No results for input(s): GLUCAP in the last 168 hours. Lipid Profile: No results for input(s): CHOL, HDL, LDLCALC, TRIG, CHOLHDL, LDLDIRECT in the last 72 hours. Thyroid Function Tests: No results for input(s): TSH, T4TOTAL, FREET4, T3FREE, THYROIDAB in the last 72 hours. Anemia Panel: No results for input(s): VITAMINB12, FOLATE,  FERRITIN, TIBC, IRON, RETICCTPCT in the last 72 hours. Urine analysis:    Component Value Date/Time   COLORURINE YELLOW 01/22/2017 1524   APPEARANCEUR CLOUDY (A) 01/22/2017 1524   LABSPEC 1.021 01/22/2017 1524   PHURINE 7.0 01/22/2017 1524   GLUCOSEU NEGATIVE 01/22/2017 1524   HGBUR NEGATIVE 01/22/2017 1524   BILIRUBINUR NEGATIVE 01/22/2017 1524   KETONESUR NEGATIVE 01/22/2017 1524   PROTEINUR NEGATIVE 01/22/2017 1524   UROBILINOGEN 0.2 10/21/2013 0200   NITRITE NEGATIVE 01/22/2017 1524   LEUKOCYTESUR NEGATIVE 01/22/2017 1524   Sepsis Labs: !!!!!!!!!!!!!!!!!!!!!!!!!!!!!!!!!!!!!!!!!!!! @LABRCNTIP (procalcitonin:4,lacticidven:4) )No results found for this or any previous visit (from the past 240 hour(s)).   Radiological Exams on Admission: Dg Chest 2 View  Result Date: 03/18/2018 CLINICAL DATA:  Shortness of breath for 2 weeks. Right-sided chest pain. History of  COPD. History of lung cancer. EXAM: CHEST - 2 VIEW COMPARISON:  03/08/2018 FINDINGS: The cardiomediastinal silhouette is unchanged and within normal limits. Peribronchial thickening is similar to the prior study. There is chronic blunting of the right lateral costophrenic angle which is unchanged. No sizable pleural effusion, confluent airspace opacity, overt pulmonary edema, or pneumothorax is identified. An old left lateral ninth rib fracture is noted. IMPRESSION: Chronic bronchitic changes. Electronically Signed   By: Logan Bores M.D.   On: 03/18/2018 14:03    Chest x-ray reviewed no edema or infiltrate  Old chart reviewed  Case discussed with EDP   Assessment/Plan 57 year old male with COPD exacerbation Principal Problem:   COPD exacerbation (HCC)-frequent nebs, IV site Medrol, Z-Pak, continue supplemental oxygen as needed to keep O2 sats above 88%.  Active Problems:   Dyspnea-due to COPD as above   Essential hypertension-continue home meds   Non-small cell cancer of right lung (HCC)-continue outpatient follow-up      DVT prophylaxis: SCDs Code Status: Full Family Communication: None Disposition Plan: Per day team Consults called: No Admission status: Admission    Huntington Leverich A MD [facility]  If 7PM-7AM, please contact night-coverage [email or web address] Password [facility]  03/18/2018, 3:34 PM

## 2018-03-19 LAB — BASIC METABOLIC PANEL
ANION GAP: 8 (ref 5–15)
BUN: 13 mg/dL (ref 6–20)
CHLORIDE: 90 mmol/L — AB (ref 101–111)
CO2: 39 mmol/L — ABNORMAL HIGH (ref 22–32)
Calcium: 9.2 mg/dL (ref 8.9–10.3)
Creatinine, Ser: 0.93 mg/dL (ref 0.61–1.24)
GFR calc Af Amer: >60 mL/min (ref >60)
GFR calc non Af Amer: >60 mL/min (ref >60)
GLUCOSE: 203 mg/dL — AB (ref 65–99)
POTASSIUM: 3.4 mmol/L — AB (ref 3.5–5.1)
Sodium: 137 mmol/L (ref 135–145)

## 2018-03-19 LAB — CBC
HEMATOCRIT: 49.4 % (ref 39.0–52.0)
HEMOGLOBIN: 15.7 g/dL (ref 13.0–17.0)
MCH: 30.3 pg (ref 26.0–34.0)
MCHC: 31.8 g/dL (ref 30.0–36.0)
MCV: 95.4 fL (ref 78.0–100.0)
Platelets: 211 10*3/uL (ref 150–400)
RBC: 5.18 MIL/uL (ref 4.22–5.81)
RDW: 12.8 % (ref 11.5–15.5)
WBC: 16.1 10*3/uL — AB (ref 4.0–10.5)

## 2018-03-19 MED ORDER — GUAIFENESIN-DM 100-10 MG/5ML PO SYRP
5.0000 mL | ORAL_SOLUTION | ORAL | Status: DC | PRN
Start: 2018-03-19 — End: 2018-03-22
  Administered 2018-03-19 – 2018-03-21 (×3): 5 mL via ORAL
  Filled 2018-03-19 (×3): qty 5

## 2018-03-19 NOTE — Progress Notes (Signed)
Inpatient Diabetes Program Recommendations  AACE/ADA: New Consensus Statement on Inpatient Glycemic Control (2015)  Target Ranges:  Prepandial:   less than 140 mg/dL      Peak postprandial:   less than 180 mg/dL (1-2 hours)      Critically ill patients:  140 - 180 mg/dL   Lab Results  Component Value Date   GLUCAP 133 (H) 01/02/2018   HGBA1C 6.1 (H) 01/24/2018   Results for KAMARE, CASPERS (MRN [id number]) as of 03/19/2018 12:30  Ref. Range 03/18/2018 11:56 03/18/2018 13:56 03/18/2018 14:08 03/19/2018 06:05 03/19/2018 10:14  Glucose Latest Ref Range: 65 - 99 mg/dL 116 (H)   203 (H)     Noted pt on 80 mg Solumedrol q 12 .  Please consider adding novolog sensitive correction scale TID ac and hs. Will follow.  [place], CDE. M.Ed. Pager [phone] Inpatient Diabetes Coordinator

## 2018-03-19 NOTE — Progress Notes (Signed)
PROGRESS NOTE    Miguel Marsh  ZOX:[id number] DOB: [birth year] DOA: 03/18/2018 PCP: [name], DO     Brief Narrative:  57 year old man admitted from home on 5/20 with complaints of shortness of breath.  Known history of COPD exacerbation and admitted for same.   Assessment & Plan:   Principal Problem:   COPD exacerbation ([place]) Active Problems:   Dyspnea   Essential hypertension   Non-small cell cancer of right lung (HCC)   COPD with acute exacerbation/acute on chronic hypoxemic respiratory failure -Improving although per patient still not at baseline. -Still with bilateral expiratory wheezes on lung auscultation. -Plan to continue IV steroids without titration today and nebs.  Also on prophylactic azithromycin.  Tobacco abuse -I have discussed tobacco cessation with the patient.  I have counseled the patient regarding the negative impacts of continued tobacco use including but not limited to lung cancer, COPD, and cardiovascular disease.  I have discussed alternatives to tobacco and modalities that may help facilitate tobacco cessation including but not limited to biofeedback, hypnosis, and medications.  Total time spent with tobacco counseling was 4 minutes.    History of non-small cell cancer of the right lung -Continue outpatient follow-up.    DVT prophylaxis: SCDs Code Status: Full code Family Communication: Patient only Disposition Plan: Home when medically stable, anticipate 48 hours  Consultants:   None  Procedures:   None  Antimicrobials:  Anti-infectives (From admission, onward)   Start     Dose/Rate Route Frequency Ordered Stop   03/19/18 1800  azithromycin (ZITHROMAX) tablet 250 mg     250 mg Oral Daily-1800 03/18/18 1541 03/23/18 1759   03/18/18 1700  azithromycin (ZITHROMAX) tablet 500 mg     500 mg Oral Daily 03/18/18 1541 03/18/18 1744       Subjective: Feels better, less shortness of breath, still states he is not at baseline and  is getting short of breath even with short distances of ambulation, denies chest pain  Objective: Vitals:   03/19/18 1007 03/19/18 1016 03/19/18 1349 03/19/18 1437  BP: (!) 151/89   124/68  Pulse: (!) 120   94  Resp:    20  Temp:    98.6 F (37 C)  TempSrc:    Oral  SpO2:  94% 94% 92%  Weight:      Height:        Intake/Output Summary (Last 24 hours) at 03/19/2018 1552 Last data filed at 03/19/2018 1012 Gross per 24 hour  Intake 480 ml  Output 600 ml  Net -120 ml   Filed Weights   03/18/18 1136 03/18/18 1700 03/19/18 0537  Weight: 113.4 kg (250 lb) 113.4 kg (250 lb) 106.9 kg (235 lb 10.8 oz)    Examination:  General exam: Alert, awake, oriented x 3 Respiratory system: Bilateral expiratory wheezes Cardiovascular system:RRR. No murmurs, rubs, gallops. Gastrointestinal system: Abdomen is nondistended, soft and nontender. No organomegaly or masses felt. Normal bowel sounds heard. Central nervous system: Alert and oriented. No focal neurological deficits. Extremities: No C/C/E, +pedal pulses Skin: No rashes, lesions or ulcers Psychiatry: Judgement and insight appear normal. Mood & affect appropriate.     Data Reviewed: I have personally reviewed following labs and imaging studies  CBC: Recent Labs  Lab 03/18/18 1156 03/19/18 0605  WBC 9.9 16.1*  NEUTROABS 6.4  --   HGB 16.0 15.7  HCT 51.0 49.4  MCV 97.1 95.4  PLT 207 811   Basic Metabolic Panel: Recent Labs  Lab 03/18/18 1156 03/19/18  0605  NA 137 137  K 4.0 3.4*  CL 94* 90*  CO2 40* 39*  GLUCOSE 116* 203*  BUN 9 13  CREATININE 0.84 0.93  CALCIUM 8.9 9.2   GFR: Estimated Creatinine Clearance: 101.6 mL/min (by C-G formula based on SCr of 0.93 mg/dL). Liver Function Tests: No results for input(s): AST, ALT, ALKPHOS, BILITOT, PROT, ALBUMIN in the last 168 hours. No results for input(s): LIPASE, AMYLASE in the last 168 hours. No results for input(s): AMMONIA in the last 168 hours. Coagulation  Profile: No results for input(s): INR, PROTIME in the last 168 hours. Cardiac Enzymes: Recent Labs  Lab 03/18/18 1408  TROPONINI <0.03   BNP (last 3 results) No results for input(s): PROBNP in the last 8760 hours. HbA1C: No results for input(s): HGBA1C in the last 72 hours. CBG: No results for input(s): GLUCAP in the last 168 hours. Lipid Profile: No results for input(s): CHOL, HDL, LDLCALC, TRIG, CHOLHDL, LDLDIRECT in the last 72 hours. Thyroid Function Tests: No results for input(s): TSH, T4TOTAL, FREET4, T3FREE, THYROIDAB in the last 72 hours. Anemia Panel: No results for input(s): VITAMINB12, FOLATE, FERRITIN, TIBC, IRON, RETICCTPCT in the last 72 hours. Urine analysis:    Component Value Date/Time   COLORURINE YELLOW 01/22/2017 1524   APPEARANCEUR CLOUDY (A) 01/22/2017 1524   LABSPEC 1.021 01/22/2017 1524   PHURINE 7.0 01/22/2017 1524   GLUCOSEU NEGATIVE 01/22/2017 1524   HGBUR NEGATIVE 01/22/2017 1524   BILIRUBINUR NEGATIVE 01/22/2017 1524   KETONESUR NEGATIVE 01/22/2017 1524   PROTEINUR NEGATIVE 01/22/2017 1524   UROBILINOGEN 0.2 10/21/2013 0200   NITRITE NEGATIVE 01/22/2017 1524   LEUKOCYTESUR NEGATIVE 01/22/2017 1524   Sepsis Labs: @LABRCNTIP (procalcitonin:4,lacticidven:4)  )No results found for this or any previous visit (from the past 240 hour(s)).       Radiology Studies: Dg Chest 2 View  Result Date: 03/18/2018 CLINICAL DATA:  Shortness of breath for 2 weeks. Right-sided chest pain. History of COPD. History of lung cancer. EXAM: CHEST - 2 VIEW COMPARISON:  03/08/2018 FINDINGS: The cardiomediastinal silhouette is unchanged and within normal limits. Peribronchial thickening is similar to the prior study. There is chronic blunting of the right lateral costophrenic angle which is unchanged. No sizable pleural effusion, confluent airspace opacity, overt pulmonary edema, or pneumothorax is identified. An old left lateral ninth rib fracture is noted. IMPRESSION:  Chronic bronchitic changes. Electronically Signed   By: Logan Bores M.D.   On: 03/18/2018 14:03        Scheduled Meds: . azithromycin  250 mg Oral q1800  . guaiFENesin  600 mg Oral BID  . hydrochlorothiazide  25 mg Oral Daily  . ipratropium-albuterol  3 mL Nebulization Q6H  . methylPREDNISolone (SOLU-MEDROL) injection  80 mg Intravenous Q12H  . sodium chloride flush  3 mL Intravenous Q12H   Continuous Infusions: . sodium chloride       LOS: 1 day    Time spent: 25 minutes.     Lelon Frohlich, MD [facility] Pager [phone]  If 7PM-7AM, please contact night-coverage [email or web address] Password [email or web address] 03/19/2018, 3:52 PM

## 2018-03-20 DIAGNOSIS — J9621 Acute and chronic respiratory failure with hypoxia: Secondary | ICD-10-CM

## 2018-03-20 MED ORDER — INSULIN ASPART 100 UNIT/ML ~~LOC~~ SOLN
0.0000 [IU] | Freq: Three times a day (TID) | SUBCUTANEOUS | Status: DC
  Administered 2018-03-21 – 2018-03-22 (×4): 3 [IU] via SUBCUTANEOUS
  Administered 2018-03-22: 5 [IU] via SUBCUTANEOUS

## 2018-03-20 MED ORDER — BUDESONIDE 0.5 MG/2ML IN SUSP
0.5000 mg | Freq: Two times a day (BID) | RESPIRATORY_TRACT | Status: DC
  Administered 2018-03-20 – 2018-03-22 (×4): 0.5 mg via RESPIRATORY_TRACT
  Filled 2018-03-20 (×4): qty 2

## 2018-03-20 NOTE — Progress Notes (Signed)
PROGRESS NOTE  Miguel Marsh ZJI:[id number] DOB: [birth year] DOA: 03/18/2018 PCP: [name], DO  Brief History:  57 y.o.malewith medical history ofmetastatic non-small cell lung cancer in remission, hypertension, chronic respiratory failure on 2 L at night, GERD, COPD, tobacco abuse presented with 1 day history of worsening shortness of breath, cough, and white sputum production. The patient continues to smoke 5 cigarettes/day. He has a nearly 80 pack year history.  The patient was recently discharged from the hospital after a stay from 03/06/2017 through 03/08/2018 for COPD exacerbation.  He was discharged home with prednisone and doxycycline for 5 days.  Within 2 to 3 days after discharge, the patient began having shortness of breath once again.  The patient was also recently admitted from 01/23/2018 through 01/28/2018 for acute on chronic respiratory failure secondary to COPD exacerbation.    Assessment/Plan: Acute on chronic respiratory failure with hypoxiaand hypercarbia -Initially on 4 L nasal cannula>>>weaned to 2L -Normally on 2 L 24/7 -Wean oxygen for saturation greater than 92%  COPD exacerbation -StartPulmicort -continue IV solumedrol--increased to q 8 hours -Continue duo nebs during hospitalization  Malignancy related pain -Continue home dose of oxycodone -EKG--neg for ischemic changes -pt has had chronic Right lower chest, RUQ pain since his surgery 12/04/17 -hycodan for cough -01/25/2018 CT abdomen and pelvis--no acute findings, hepatic steatosis, cholelithiasis  Non-small cell lung cancer -Presently in remission -[facility] -Diagnosed 06/11/2015  -Received Pembrolizumab9/09/2015-07/16/2017  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, mediastinal mass excision and lymph node dissection with nerve sheath tumor resection 12/04/2017 at [facility] -follow up cardiothoracic at [facility]  Essential hypertension -continue HCTZ -BP remains  controlled  Medication inducedHyperglycemia -3/28/19hemoglobin A1c--6.1 -start novolog ISS -elevated CBG due to steroids  Morbid obesity -BMI 40.27 -Lifestyle modification  Tobacco abuse -cessation discussed  Hypomagnesemia -repleted -check mag--2.0  RLL nodule -incidental finding on CT abd -outpt surveillance   Disposition Plan:   Home in 1-2 days  Family Communication:   No Family at bedside-Total time spent 35 minutes.  Greater than 50% spent face to face counseling and coordinating care.   Consultants:  none  Code Status:  FULL  DVT Prophylaxis:  SCDs   Procedures: As Listed in Progress Note Above  Antibiotics: Azithromycin 03/18/18    Subjective: Patient states that he is breathing 25% better.  Denies any nausea, vomiting, diarrhea, abdominal pain, chest pain, headache, neck pain.  Objective: Vitals:   03/20/18 0617 03/20/18 0724 03/20/18 1336 03/20/18 1437  BP: 114/66   123/71  Pulse: 78   (!) 103  Resp: 20   20  Temp: 98.1 F (36.7 C)   98.1 F (36.7 C)  TempSrc: Oral   Oral  SpO2: 97% 96% 97% 95%  Weight:      Height:        Intake/Output Summary (Last 24 hours) at 03/20/2018 1750 Last data filed at 03/20/2018 0616 Gross per 24 hour  Intake 480 ml  Output 700 ml  Net -220 ml   Weight change: -6.499 kg (-14 lb 5.3 oz) Exam:   General:  Pt is alert, follows commands appropriately, not in acute distress  HEENT: No icterus, No thrush, No neck mass, [place]/AT  Cardiovascular: RRR, S1/S2, no rubs, no gallops  Respiratory: Bilateral scattered rales.  Bibasilar wheezing.  Good air movement.  Abdomen: Soft/+BS, non tender, non distended, no guarding  Extremities: No edema, No lymphangitis, No petechiae, No rashes, no synovitis   Data  Reviewed: I have personally reviewed following labs and imaging studies Basic Metabolic Panel: Recent Labs  Lab 03/18/18 1156 03/19/18 0605  NA 137 137  K 4.0 3.4*  CL 94* 90*  CO2 40* 39*    GLUCOSE 116* 203*  BUN 9 13  CREATININE 0.84 0.93  CALCIUM 8.9 9.2   Liver Function Tests: No results for input(s): AST, ALT, ALKPHOS, BILITOT, PROT, ALBUMIN in the last 168 hours. No results for input(s): LIPASE, AMYLASE in the last 168 hours. No results for input(s): AMMONIA in the last 168 hours. Coagulation Profile: No results for input(s): INR, PROTIME in the last 168 hours. CBC: Recent Labs  Lab 03/18/18 1156 03/19/18 0605  WBC 9.9 16.1*  NEUTROABS 6.4  --   HGB 16.0 15.7  HCT 51.0 49.4  MCV 97.1 95.4  PLT 207 211   Cardiac Enzymes: Recent Labs  Lab 03/18/18 1408  TROPONINI <0.03   BNP: Invalid input(s): POCBNP CBG: No results for input(s): GLUCAP in the last 168 hours. HbA1C: No results for input(s): HGBA1C in the last 72 hours. Urine analysis:    Component Value Date/Time   COLORURINE YELLOW 01/22/2017 1524   APPEARANCEUR CLOUDY (A) 01/22/2017 1524   LABSPEC 1.021 01/22/2017 1524   PHURINE 7.0 01/22/2017 1524   GLUCOSEU NEGATIVE 01/22/2017 1524   HGBUR NEGATIVE 01/22/2017 1524   BILIRUBINUR NEGATIVE 01/22/2017 1524   KETONESUR NEGATIVE 01/22/2017 1524   PROTEINUR NEGATIVE 01/22/2017 1524   UROBILINOGEN 0.2 10/21/2013 0200   NITRITE NEGATIVE 01/22/2017 1524   LEUKOCYTESUR NEGATIVE 01/22/2017 1524   Sepsis Labs: @LABRCNTIP (procalcitonin:4,lacticidven:4) )No results found for this or any previous visit (from the past 240 hour(s)).   Scheduled Meds: . azithromycin  250 mg Oral q1800  . guaiFENesin  600 mg Oral BID  . hydrochlorothiazide  25 mg Oral Daily  . ipratropium-albuterol  3 mL Nebulization Q6H  . methylPREDNISolone (SOLU-MEDROL) injection  80 mg Intravenous Q12H  . sodium chloride flush  3 mL Intravenous Q12H   Continuous Infusions: . sodium chloride      Procedures/Studies: Dg Chest 2 View  Result Date: 03/18/2018 CLINICAL DATA:  Shortness of breath for 2 weeks. Right-sided chest pain. History of COPD. History of lung cancer. EXAM:  CHEST - 2 VIEW COMPARISON:  03/08/2018 FINDINGS: The cardiomediastinal silhouette is unchanged and within normal limits. Peribronchial thickening is similar to the prior study. There is chronic blunting of the right lateral costophrenic angle which is unchanged. No sizable pleural effusion, confluent airspace opacity, overt pulmonary edema, or pneumothorax is identified. An old left lateral ninth rib fracture is noted. IMPRESSION: Chronic bronchitic changes. Electronically Signed   By: Logan Bores M.D.   On: 03/18/2018 14:03   Dg Chest Port 1 View  Result Date: 03/08/2018 CLINICAL DATA:  Hypoxemia. EXAM: PORTABLE CHEST 1 VIEW COMPARISON:  03/06/2018 and 12/31/2017 FINDINGS: Blunting at the right costophrenic angle is probably chronic. Central vascular structures are mildly prominent. Mild central peribronchial thickening. No focal airspace disease or consolidation. Heart size is within normal limits. Possible surgical clips near the aortic arch. Negative for a pneumothorax. Trachea is midline. IMPRESSION: No acute chest findings. Mild central airway thickening could be related to bronchitis or chronic changes. Electronically Signed   By: Markus Daft M.D.   On: 03/08/2018 11:37   Dg Chest Portable 1 View  Result Date: 03/06/2018 CLINICAL DATA:  57 year old male with shortness of breath EXAM: PORTABLE CHEST 1 VIEW COMPARISON:  Chest radiograph dated 01/23/2018 FINDINGS: Mild cardiomegaly with mild vascular  congestion. Focal area of subpleural density at the right lung base, likely scarring. There is slight blunting of the costophrenic angles, likely small pleural effusions. No pneumothorax. No acute osseous pathology. IMPRESSION: 1. Mild cardiomegaly and mild vascular congestion. No focal consolidation. 2. Trace bilateral pleural effusions. Electronically Signed   By: Anner Crete M.D.   On: 03/06/2018 01:20    [name], DO  [facility] Pager [phone]  If 7PM-7AM, please contact  night-coverage [email or web address] Password [email or web address] 03/20/2018, 5:50 PM   LOS: 2 days

## 2018-03-20 NOTE — Care Management Important Message (Signed)
Important Message  Patient Details  Name: ARTIE MCINTYRE MRN: [id number] Date of Birth: 10-10-1961   Medicare Important Message Given:  Yes    Shelda Altes 03/20/2018, 2:59 PM

## 2018-03-20 NOTE — Care Management Note (Signed)
Case Management Note  Patient Details  Name: Miguel Marsh MRN: [id number] Date of Birth: 12-25-1960  Subjective/Objective: Adm with COPD exacerbation. From home. Has aide services M-F for 2.5 hours and Sat/Sun for 2 hours. Has continuous oxygen setup at home.  Has PCP- uses [facility] for transportation. No issues affording medications.                   Action/Plan: DC home with self care.    Expected Discharge Date:     03/21/2018             Expected Discharge Plan:  Home/Self Care  In-House Referral:     Discharge planning Services  CM Consult  Post Acute Care Choice:  NA Choice offered to:  NA  DME Arranged:    DME Agency:     HH Arranged:    HH Agency:     Status of Service:  Completed, signed off  If discussed at [employer] of Stay Meetings, dates discussed:    Additional Comments:  Miguel Marsh, Miguel Reading, RN 03/20/2018, 2:07 PM

## 2018-03-20 NOTE — Plan of Care (Signed)
Pt able to verbalize needs for discharge. Pt states he has improved but feels he is not ready to go home yet. Pt able to tolerate diet. Pt able to verbalize activity needs and balance activity vs rest. Pt able to maintain airway. NAD.

## 2018-03-21 LAB — BASIC METABOLIC PANEL
Anion gap: 8 (ref 5–15)
BUN: 17 mg/dL (ref 6–20)
CHLORIDE: 90 mmol/L — AB (ref 101–111)
CO2: 37 mmol/L — ABNORMAL HIGH (ref 22–32)
Calcium: 9.5 mg/dL (ref 8.9–10.3)
Creatinine, Ser: 0.86 mg/dL (ref 0.61–1.24)
GFR calc Af Amer: >60 mL/min (ref >60)
Glucose, Bld: 254 mg/dL — ABNORMAL HIGH (ref 65–99)
Potassium: 4.4 mmol/L (ref 3.5–5.1)
SODIUM: 135 mmol/L (ref 135–145)

## 2018-03-21 LAB — HEMOGLOBIN A1C
Hgb A1c MFr Bld: 6.6 % — ABNORMAL HIGH (ref 4.8–5.6)
Mean Plasma Glucose: 142.72 mg/dL

## 2018-03-21 LAB — GLUCOSE, CAPILLARY
GLUCOSE-CAPILLARY: 231 mg/dL — AB (ref 65–99)
GLUCOSE-CAPILLARY: 234 mg/dL — AB (ref 65–99)
GLUCOSE-CAPILLARY: 233 mg/dL — AB (ref 65–99)
Glucose-Capillary: 297 mg/dL — ABNORMAL HIGH (ref 65–99)

## 2018-03-21 LAB — MAGNESIUM: MAGNESIUM: 1.8 mg/dL (ref 1.7–2.4)

## 2018-03-21 MED ORDER — METHYLPREDNISOLONE SODIUM SUCC 125 MG IJ SOLR
60.0000 mg | Freq: Once | INTRAMUSCULAR | Status: AC
  Administered 2018-03-21: 60 mg via INTRAVENOUS
  Filled 2018-03-21: qty 2

## 2018-03-21 MED ORDER — PREDNISONE 20 MG PO TABS
60.0000 mg | ORAL_TABLET | Freq: Every day | ORAL | Status: DC
  Administered 2018-03-22: 60 mg via ORAL
  Filled 2018-03-21: qty 3

## 2018-03-21 NOTE — Discharge Summary (Signed)
Physician Discharge Summary  Miguel Marsh:[id number] DOB: October 30, 1961 DOA: 03/18/2018  PCP: Miguel Graves, DO  Admit date: 03/18/2018 Discharge date: 03/22/18  Admitted From: Home Disposition:  Home  Recommendations for Outpatient Follow-up:  1. Follow up with PCP in 1-2 weeks 2. Please obtain BMP/CBC in one week   Home Health: YES Equipment/Devices: 2L [place]  Discharge Condition: Stable CODE STATUS: FULL Diet recommendation: Heart Healthy / Carb Modified   Brief/Interim Summary: 57 y.o.malewith medical history ofmetastatic non-small cell lung cancer in remission, hypertension, chronic respiratory failure on 2 L at night, GERD, COPD, tobacco abuse presented with 1 day history of worsening shortness of breath, cough, and white sputum production. The patient continues to smoke 5 cigarettes/day. He has a nearly 80 pack year history.The patient was recently discharged from the hospital after a stay from 03/06/2017 through 03/08/2018 for COPD exacerbation. He was discharged home with prednisone and doxycycline for 5 days. Within 2 to 3 days after discharge, the patient began having shortness of breath once again. The patient was also recently admitted from 01/23/2018 through 01/28/2018 for acute on chronic respiratory failure secondary to COPD exacerbation.    Discharge Diagnoses:  Acute on chronic respiratory failure with hypoxiaand hypercarbia -Initially on 4 Lnasal cannula>>>weaned to 2L -Normallyon 2 L , 24/7 -Wean oxygen for saturation greater than 92%  COPD exacerbation -StartedPulmicort during hospitalization -continue IV solumedrol>>>wean to po prednisone -d/c home with prednisone taper over 2 weeks -Continue duo nebsduring hospitalization  Malignancy related pain -Continue home dose of oxycodone -EKG--neg for ischemic changes -pt has had chronic Right lower chest, RUQ pain since his surgery 12/04/17 -01/25/2018 CT abdomen and pelvis--no acute findings,  hepatic steatosis, cholelithiasis  Non-small cell lung cancer -Presently in remission -Follow[facility]Med Onc at [facility] -Diagnosed 06/11/2015  -Received Pembrolizumab9/09/2015-07/16/2017  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, mediastinal mass excision and lymph node dissection with nerve sheath tumor resection 12/04/2017 at [facility] -follow up cardiothoracic at [facility]  Essential hypertension -continue HCTZ -BP remains controlled  Medication inducedHyperglycemia/Impaired glucose tolerance -5/23/19hemoglobin A1c--6.6 -start novolog ISS -elevated CBG due to steroids  Morbid obesity -BMI 40.27 -Lifestyle modification  Tobacco abuse -cessation discussed  Hypomagnesemia -repleted -check mag--2.0  RLL nodule -incidental finding on CT abd -outpt surveillance     Discharge Instructions   Allergies as of 03/22/2018   No Known Allergies     Medication List    TAKE these medications   acetaminophen 500 MG tablet Commonly known as:  TYLENOL Take 500 mg by mouth every 6 (six) hours as needed for mild pain.   albuterol 108 (90 Base) MCG/ACT inhaler Commonly known as:  PROVENTIL HFA;VENTOLIN HFA Inhale 2 puffs into the lungs every 6 (six) hours as needed for wheezing.   albuterol (2.5 MG/3ML) 0.083% nebulizer solution Commonly known as:  PROVENTIL Take 2.5 mg by nebulization every 4 (four) hours as needed for wheezing or shortness of breath.   guaiFENesin 600 MG 12 hr tablet Commonly known as:  MUCINEX Take 1 tablet (600 mg total) by mouth 2 (two) times daily.   hydrochlorothiazide 25 MG tablet Commonly known as:  HYDRODIURIL Take 25 mg by mouth daily.   nicotine polacrilex 2 MG gum Commonly known as:  NICORETTE Take 2 mg by mouth as needed for smoking cessation.   Oxycodone HCl 10 MG Tabs Take 10 mg by mouth every 4 (four) hours as needed (pain).   predniSONE 10 MG tablet Commonly known as:  DELTASONE Take 6 tablets (60 mg total) by mouth  daily  with breakfast. And decrease by one tablet every 2 days Start taking on:  03/23/2018   PRILOSEC 40 MG capsule Generic drug:  omeprazole Take 40 mg by mouth daily.   senna-docusate 8.6-50 MG tablet Commonly known as:  Senokot-S Take 2 tablets by mouth at bedtime as needed for mild constipation.   SYMBICORT 80-4.5 MCG/ACT inhaler Generic drug:  budesonide-formoterol Inhale 2 puffs into the lungs 2 (two) times daily.   tiotropium 18 MCG inhalation capsule Commonly known as:  SPIRIVA Place 18 mcg into inhaler and inhale daily.       No Known Allergies  Consultations:  none   Procedures/Studies: Dg Chest 2 View  Result Date: 03/18/2018 CLINICAL DATA:  Shortness of breath for 2 weeks. Right-sided chest pain. History of COPD. History of lung cancer. EXAM: CHEST - 2 VIEW COMPARISON:  03/08/2018 FINDINGS: The cardiomediastinal silhouette is unchanged and within normal limits. Peribronchial thickening is similar to the prior study. There is chronic blunting of the right lateral costophrenic angle which is unchanged. No sizable pleural effusion, confluent airspace opacity, overt pulmonary edema, or pneumothorax is identified. An old left lateral ninth rib fracture is noted. IMPRESSION: Chronic bronchitic changes. Electronically Signed   By: Logan Bores M.D.   On: 03/18/2018 14:03   Dg Chest Port 1 View  Result Date: 03/08/2018 CLINICAL DATA:  Hypoxemia. EXAM: PORTABLE CHEST 1 VIEW COMPARISON:  03/06/2018 and 12/31/2017 FINDINGS: Blunting at the right costophrenic angle is probably chronic. Central vascular structures are mildly prominent. Mild central peribronchial thickening. No focal airspace disease or consolidation. Heart size is within normal limits. Possible surgical clips near the aortic arch. Negative for a pneumothorax. Trachea is midline. IMPRESSION: No acute chest findings. Mild central airway thickening could be related to bronchitis or chronic changes. Electronically Signed    By: Markus Daft M.D.   On: 03/08/2018 11:37   Dg Chest Portable 1 View  Result Date: 03/06/2018 CLINICAL DATA:  57 year old male with shortness of breath EXAM: PORTABLE CHEST 1 VIEW COMPARISON:  Chest radiograph dated 01/23/2018 FINDINGS: Mild cardiomegaly with mild vascular congestion. Focal area of subpleural density at the right lung base, likely scarring. There is slight blunting of the costophrenic angles, likely small pleural effusions. No pneumothorax. No acute osseous pathology. IMPRESSION: 1. Mild cardiomegaly and mild vascular congestion. No focal consolidation. 2. Trace bilateral pleural effusions. Electronically Signed   By: Anner Crete M.D.   On: 03/06/2018 01:20        Discharge Exam: Vitals:   03/22/18 0734 03/22/18 0808  BP:  132/76  Pulse:  81  Resp:  18  Temp:    SpO2: 93% 97%   Vitals:   03/22/18 0506 03/22/18 0729 03/22/18 0734 03/22/18 0808  BP: (!) 77/50   132/76  Pulse: (!) 101   81  Resp: 20   18  Temp: 98.1 F (36.7 C)     TempSrc: Oral     SpO2: 96% 93% 93% 97%  Weight: 105.8 kg (233 lb 4 oz)     Height: 5\' 6"  (1.676 m)       General: Pt is alert, awake, not in acute distress Cardiovascular: RRR, S1/S2 +, no rubs, no gallops Respiratory: Bibasilar rales without wheezing.  Good air movement. Abdominal: Soft, NT, ND, bowel sounds + Extremities: trace LE edema, no cyanosis   The results of significant diagnostics from this hospitalization (including imaging, microbiology, ancillary and laboratory) are listed below for reference.    Significant Diagnostic Studies: Dg  Chest 2 View  Result Date: 03/18/2018 CLINICAL DATA:  Shortness of breath for 2 weeks. Right-sided chest pain. History of COPD. History of lung cancer. EXAM: CHEST - 2 VIEW COMPARISON:  03/08/2018 FINDINGS: The cardiomediastinal silhouette is unchanged and within normal limits. Peribronchial thickening is similar to the prior study. There is chronic blunting of the right lateral  costophrenic angle which is unchanged. No sizable pleural effusion, confluent airspace opacity, overt pulmonary edema, or pneumothorax is identified. An old left lateral ninth rib fracture is noted. IMPRESSION: Chronic bronchitic changes. Electronically Signed   By: Logan Bores M.D.   On: 03/18/2018 14:03   Dg Chest Port 1 View  Result Date: 03/08/2018 CLINICAL DATA:  Hypoxemia. EXAM: PORTABLE CHEST 1 VIEW COMPARISON:  03/06/2018 and 12/31/2017 FINDINGS: Blunting at the right costophrenic angle is probably chronic. Central vascular structures are mildly prominent. Mild central peribronchial thickening. No focal airspace disease or consolidation. Heart size is within normal limits. Possible surgical clips near the aortic arch. Negative for a pneumothorax. Trachea is midline. IMPRESSION: No acute chest findings. Mild central airway thickening could be related to bronchitis or chronic changes. Electronically Signed   By: Markus Daft M.D.   On: 03/08/2018 11:37   Dg Chest Portable 1 View  Result Date: 03/06/2018 CLINICAL DATA:  57 year old male with shortness of breath EXAM: PORTABLE CHEST 1 VIEW COMPARISON:  Chest radiograph dated 01/23/2018 FINDINGS: Mild cardiomegaly with mild vascular congestion. Focal area of subpleural density at the right lung base, likely scarring. There is slight blunting of the costophrenic angles, likely small pleural effusions. No pneumothorax. No acute osseous pathology. IMPRESSION: 1. Mild cardiomegaly and mild vascular congestion. No focal consolidation. 2. Trace bilateral pleural effusions. Electronically Signed   By: Anner Crete M.D.   On: 03/06/2018 01:20     Microbiology: No results found for this or any previous visit (from the past 240 hour(s)).   Labs: Basic Metabolic Panel: Recent Labs  Lab 03/18/18 1156 03/19/18 0605 03/21/18 0528  NA 137 137 135  K 4.0 3.4* 4.4  CL 94* 90* 90*  CO2 40* 39* 37*  GLUCOSE 116* 203* 254*  BUN 9 13 17   CREATININE 0.84  0.93 0.86  CALCIUM 8.9 9.2 9.5  MG  --   --  1.8   Liver Function Tests: No results for input(s): AST, ALT, ALKPHOS, BILITOT, PROT, ALBUMIN in the last 168 hours. No results for input(s): LIPASE, AMYLASE in the last 168 hours. No results for input(s): AMMONIA in the last 168 hours. CBC: Recent Labs  Lab 03/18/18 1156 03/19/18 0605  WBC 9.9 16.1*  NEUTROABS 6.4  --   HGB 16.0 15.7  HCT 51.0 49.4  MCV 97.1 95.4  PLT 207 211   Cardiac Enzymes: Recent Labs  Lab 03/18/18 1408  TROPONINI <0.03   BNP: Invalid input(s): POCBNP CBG: Recent Labs  Lab 03/21/18 1144 03/21/18 1631 03/21/18 2034 03/22/18 0727 03/22/18 1112  GLUCAP 234* 233* 297* 221* 256*    Time coordinating discharge:  36 minutes  Signed:  [name], DO [facility] Pager: [phone] 03/22/2018, 12:27 PM

## 2018-03-21 NOTE — Progress Notes (Addendum)
PROGRESS NOTE  Miguel Marsh TKP:[id number] DOB: [birth year] DOA: 03/18/2018 PCP: [name], DO  Brief History:  56 y.o.malewith medical history ofmetastatic non-small cell lung cancer in remission, hypertension, chronic respiratory failure on 2 L at night, GERD, COPD, tobacco abuse presented with 1 day history of worsening shortness of breath, cough, and white sputum production. The patient continues to smoke 5 cigarettes/day. He has a nearly 80 pack year history.  The patient was recently discharged from the hospital after a stay from 03/06/2017 through 03/08/2018 for COPD exacerbation.  He was discharged home with prednisone and doxycycline for 5 days.  Within 2 to 3 days after discharge, the patient began having shortness of breath once again.  The patient was also recently admitted from 01/23/2018 through 01/28/2018 for acute on chronic respiratory failure secondary to COPD exacerbation.    Assessment/Plan: Acute on chronic respiratory failure with hypoxiaand hypercarbia -Initially on 4 L nasal cannula>>>weaned to 2L -Normally on 2 L 24/7 -Wean oxygen for saturation greater than 92%  COPD exacerbation -StartPulmicort -continue IV solumedrol>>>wean to po prednisone -Continue duo nebsduring hospitalization  Malignancy related pain -Continue home dose of oxycodone -EKG--neg for ischemic changes -pt has had chronic Right lower chest, RUQ pain since his surgery 12/04/17 -hycodan for cough -01/25/2018 CT abdomen and pelvis--no acute findings, hepatic steatosis, cholelithiasis  Non-small cell lung cancer -Presently in remission -[facility] -Diagnosed 06/11/2015  -Received Pembrolizumab9/09/2015-07/16/2017  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, mediastinal mass excision and lymph node dissection with nerve sheath tumor resection 12/04/2017 at [facility] -follow up cardiothoracic at [facility]  Essential hypertension -continue HCTZ -BP remains  controlled  Medication inducedHyperglycemia/Impaired glucose tolerance -5/23/19hemoglobin A1c--6.6 -start novolog ISS -elevated CBG due to steroids  Morbid obesity -BMI 40.27 -Lifestyle modification  Tobacco abuse -cessation discussed  Hypomagnesemia -repleted -check mag--2.0  RLL nodule -incidental finding on CT abd -outpt surveillance   Disposition Plan:   Home in 1-2 days  Family Communication:   No Family at bedside-Total time spent 35 minutes.  Greater than 50% spent face to face counseling and coordinating care.   Consultants:  none  Code Status:  FULL  DVT Prophylaxis:  SCDs   Procedures: As Listed in Progress Note Above  Antibiotics: Azithromycin 03/18/18>>>        Subjective: Overall, patient states that he is breathing better but still has some dyspnea on minor exertion.  He denies any fevers, chills, chest pain, nausea, vomiting, diarrhea, abdominal pain.  There is no dysuria hematuria.  Objective: Vitals:   03/21/18 0609 03/21/18 0737 03/21/18 1445 03/21/18 1534  BP: 117/64   121/71  Pulse: 74   83  Resp: 20   16  Temp: 97.7 F (36.5 C)   98.5 F (36.9 C)  TempSrc: Oral   Oral  SpO2: 96% 94% 92% 97%  Weight:      Height:        Intake/Output Summary (Last 24 hours) at 03/21/2018 1616 Last data filed at 03/20/2018 2220 Gross per 24 hour  Intake 3 ml  Output -  Net 3 ml   Weight change: 0.33 kg (11.7 oz) Exam:   General:  Pt is alert, follows commands appropriately, not in acute distress  HEENT: No icterus, No thrush, No neck mass, [place]/AT  Cardiovascular: RRR, S1/S2, no rubs, no gallops  Respiratory: Bibasilar rales.  Mild bibasilar wheeze.  Good air movement.  Abdomen: Soft/+BS, non tender, non distended, no guarding  Extremities: No edema, No lymphangitis, No petechiae, No rashes, no synovitis   Data Reviewed: I have personally reviewed following labs and imaging studies Basic Metabolic Panel: Recent  Labs  Lab 03/18/18 1156 03/19/18 0605 03/21/18 0528  NA 137 137 135  K 4.0 3.4* 4.4  CL 94* 90* 90*  CO2 40* 39* 37*  GLUCOSE 116* 203* 254*  BUN 9 13 17   CREATININE 0.84 0.93 0.86  CALCIUM 8.9 9.2 9.5  MG  --   --  1.8   Liver Function Tests: No results for input(s): AST, ALT, ALKPHOS, BILITOT, PROT, ALBUMIN in the last 168 hours. No results for input(s): LIPASE, AMYLASE in the last 168 hours. No results for input(s): AMMONIA in the last 168 hours. Coagulation Profile: No results for input(s): INR, PROTIME in the last 168 hours. CBC: Recent Labs  Lab 03/18/18 1156 03/19/18 0605  WBC 9.9 16.1*  NEUTROABS 6.4  --   HGB 16.0 15.7  HCT 51.0 49.4  MCV 97.1 95.4  PLT 207 211   Cardiac Enzymes: Recent Labs  Lab 03/18/18 1408  TROPONINI <0.03   BNP: Invalid input(s): POCBNP CBG: Recent Labs  Lab 03/21/18 0812 03/21/18 1144  GLUCAP 231* 234*   HbA1C: Recent Labs    03/21/18 0528  HGBA1C 6.6*   Urine analysis:    Component Value Date/Time   COLORURINE YELLOW 01/22/2017 1524   APPEARANCEUR CLOUDY (A) 01/22/2017 1524   LABSPEC 1.021 01/22/2017 1524   PHURINE 7.0 01/22/2017 1524   GLUCOSEU NEGATIVE 01/22/2017 1524   HGBUR NEGATIVE 01/22/2017 1524   BILIRUBINUR NEGATIVE 01/22/2017 1524   KETONESUR NEGATIVE 01/22/2017 1524   PROTEINUR NEGATIVE 01/22/2017 1524   UROBILINOGEN 0.2 10/21/2013 0200   NITRITE NEGATIVE 01/22/2017 1524   LEUKOCYTESUR NEGATIVE 01/22/2017 1524   Sepsis Labs: @LABRCNTIP (procalcitonin:4,lacticidven:4) )No results found for this or any previous visit (from the past 240 hour(s)).   Scheduled Meds: . azithromycin  250 mg Oral q1800  . budesonide (PULMICORT) nebulizer solution  0.5 mg Nebulization BID  . guaiFENesin  600 mg Oral BID  . hydrochlorothiazide  25 mg Oral Daily  . insulin aspart  0-9 Units Subcutaneous TID WC  . ipratropium-albuterol  3 mL Nebulization Q6H  . methylPREDNISolone (SOLU-MEDROL) injection  80 mg Intravenous  Q12H  . sodium chloride flush  3 mL Intravenous Q12H   Continuous Infusions: . sodium chloride      Procedures/Studies: Dg Chest 2 View  Result Date: 03/18/2018 CLINICAL DATA:  Shortness of breath for 2 weeks. Right-sided chest pain. History of COPD. History of lung cancer. EXAM: CHEST - 2 VIEW COMPARISON:  03/08/2018 FINDINGS: The cardiomediastinal silhouette is unchanged and within normal limits. Peribronchial thickening is similar to the prior study. There is chronic blunting of the right lateral costophrenic angle which is unchanged. No sizable pleural effusion, confluent airspace opacity, overt pulmonary edema, or pneumothorax is identified. An old left lateral ninth rib fracture is noted. IMPRESSION: Chronic bronchitic changes. Electronically Signed   By: Logan Bores M.D.   On: 03/18/2018 14:03   Dg Chest Port 1 View  Result Date: 03/08/2018 CLINICAL DATA:  Hypoxemia. EXAM: PORTABLE CHEST 1 VIEW COMPARISON:  03/06/2018 and 12/31/2017 FINDINGS: Blunting at the right costophrenic angle is probably chronic. Central vascular structures are mildly prominent. Mild central peribronchial thickening. No focal airspace disease or consolidation. Heart size is within normal limits. Possible surgical clips near the aortic arch. Negative for a pneumothorax. Trachea is midline. IMPRESSION: No acute chest findings. Mild central airway thickening  could be related to bronchitis or chronic changes. Electronically Signed   By: Markus Daft M.D.   On: 03/08/2018 11:37   Dg Chest Portable 1 View  Result Date: 03/06/2018 CLINICAL DATA:  57 year old male with shortness of breath EXAM: PORTABLE CHEST 1 VIEW COMPARISON:  Chest radiograph dated 01/23/2018 FINDINGS: Mild cardiomegaly with mild vascular congestion. Focal area of subpleural density at the right lung base, likely scarring. There is slight blunting of the costophrenic angles, likely small pleural effusions. No pneumothorax. No acute osseous pathology.  IMPRESSION: 1. Mild cardiomegaly and mild vascular congestion. No focal consolidation. 2. Trace bilateral pleural effusions. Electronically Signed   By: Anner Crete M.D.   On: 03/06/2018 01:20    [name], DO  [facility] Pager [phone]  If 7PM-7AM, please contact night-coverage [email or web address] Password [email or web address] 03/21/2018, 4:16 PM   LOS: 3 days

## 2018-03-22 LAB — GLUCOSE, CAPILLARY
GLUCOSE-CAPILLARY: 256 mg/dL — AB (ref 65–99)
Glucose-Capillary: 221 mg/dL — ABNORMAL HIGH (ref 65–99)

## 2018-03-22 MED ORDER — PREDNISONE 10 MG PO TABS: 60.0000 mg | ORAL_TABLET | Freq: Every day | ORAL | 0 refills | Status: DC

## 2018-03-22 NOTE — Progress Notes (Signed)
Inpatient Diabetes Program Recommendations  AACE/ADA: New Consensus Statement on Inpatient Glycemic Control (2015)  Target Ranges:  Prepandial:   less than 140 mg/dL      Peak postprandial:   less than 180 mg/dL (1-2 hours)      Critically ill patients:  140 - 180 mg/dL   Lab Results  Component Value Date   GLUCAP 221 (H) 03/22/2018   HGBA1C 6.6 (H) 03/21/2018    Review of Glycemic Control  Blood sugars in 200s. Needs insulin adjustment.  Inpatient Diabetes Program Recommendations:     Increase Novolog to 0-15 units tidwc and hs If FBS stays > 180 mg/dL, would add low-dose basal insulin.  Continue to follow trends.   Thank you. Lorenda Peck, RD, LDN, CDE Inpatient Diabetes Coordinator [phone]

## 2018-03-22 NOTE — Care Management Important Message (Signed)
Important Message  Patient Details  Name: Miguel Marsh MRN: [id number] Date of Birth: 26-Nov-1960   Medicare Important Message Given:  Yes    Shelda Altes 03/22/2018, 11:42 AM

## 2018-03-22 NOTE — Discharge Instructions (Signed)
Chronic Obstructive Pulmonary Disease Exacerbation  Chronic obstructive pulmonary disease (COPD) is a common lung problem. In COPD, the flow of air from the lungs is limited. COPD exacerbations are times that breathing gets worse and you need extra treatment. Without treatment they can be life threatening. If they happen often, your lungs can become more damaged. If your COPD gets worse, your doctor may treat you with:  ? Medicines.  ? Oxygen.  ? Different ways to clear your airway, such as using a mask.    Follow these instructions at home:  ? Do not smoke.  ? Avoid tobacco smoke and other things that bother your lungs.  ? If given, take your antibiotic medicine as told. Finish the medicine even if you start to feel better.  ? Only take medicines as told by your doctor.  ? Drink enough fluids to keep your pee (urine) clear or pale yellow (unless your doctor has told you not to).  ? Use a cool mist machine (vaporizer).  ? If you use oxygen or a machine that turns liquid medicine into a mist (nebulizer), continue to use them as told.  ? Keep up with shots (vaccinations) as told by your doctor.  ? Exercise regularly.  ? Eat healthy foods.  ? Keep all doctor visits as told.  Get help right away if:  ? You are very short of breath and it gets worse.  ? You have trouble talking.  ? You have bad chest pain.  ? You have blood in your spit (sputum).  ? You have a fever.  ? You keep throwing up (vomiting).  ? You feel weak, or you pass out (faint).  ? You feel confused.  ? You keep getting worse.  This information is not intended to replace advice given to you by your health care provider. Make sure you discuss any questions you have with your health care provider.  Document Released: 10/05/2011 Document Revised: 03/23/2016 Document Reviewed: 06/20/2013  Elsevier Interactive Patient Education ? 2017 [employer]vier Inc[employer]

## 2018-03-22 NOTE — Progress Notes (Signed)
Discharge instructions reviewed with patient. Given copy of AVS, prescription sent to pharmacy by MD. Miguel Marsh verbalized understanding of instructions, states has follow-up appointment already scheduled and will pick-up prescription. States has home health aide coming to pick him up and home oxygen already in place. States he does not use oxygen all the time at home and would prefer not to have his aide bring for ride home. Stated "I have my keys and don't want her to bring it. I'm less than 5 minutes up the [address]." O2 sats 93% on r/a. Denies shortness of breath at this time. Miguel Marsh in stable condition awaiting nurse tech and w/c to accompany him off unit for discharge. Donavan Foil, RN

## 2018-07-16 ENCOUNTER — Encounter (HOSPITAL_COMMUNITY): Payer: Self-pay

## 2018-07-16 ENCOUNTER — Emergency Department (HOSPITAL_COMMUNITY): Payer: Medicare HMO

## 2018-07-16 ENCOUNTER — Other Ambulatory Visit: Payer: Self-pay

## 2018-07-16 ENCOUNTER — Observation Stay (HOSPITAL_COMMUNITY)
Admission: EM | Admit: 2018-07-16 | Discharge: 2018-07-17 | Disposition: A | Discharge status: Home / Self Care | Payer: Medicare HMO | Attending: Family Medicine | Admitting: Family Medicine

## 2018-07-16 DIAGNOSIS — F17210 Nicotine dependence, cigarettes, uncomplicated: Secondary | ICD-10-CM | POA: Diagnosis not present

## 2018-07-16 DIAGNOSIS — Z79899 Other long term (current) drug therapy: Secondary | ICD-10-CM | POA: Insufficient documentation

## 2018-07-16 DIAGNOSIS — J9621 Acute and chronic respiratory failure with hypoxia: Secondary | ICD-10-CM | POA: Diagnosis not present

## 2018-07-16 DIAGNOSIS — Z96649 Presence of unspecified artificial hip joint: Secondary | ICD-10-CM | POA: Insufficient documentation

## 2018-07-16 DIAGNOSIS — J441 Chronic obstructive pulmonary disease with (acute) exacerbation: Secondary | ICD-10-CM | POA: Diagnosis not present

## 2018-07-16 DIAGNOSIS — F17200 Nicotine dependence, unspecified, uncomplicated: Secondary | ICD-10-CM | POA: Diagnosis present

## 2018-07-16 DIAGNOSIS — I10 Essential (primary) hypertension: Secondary | ICD-10-CM | POA: Diagnosis present

## 2018-07-16 DIAGNOSIS — R0602 Shortness of breath: Secondary | ICD-10-CM | POA: Diagnosis present

## 2018-07-16 LAB — CBC WITH DIFFERENTIAL/PLATELET
Basophils Absolute: 0 K/uL (ref 0.0–0.1)
Basophils Relative: 0 %
Eosinophils Absolute: 0.1 K/uL (ref 0.0–0.7)
Eosinophils Relative: 1 %
HCT: 49.5 % (ref 39.0–52.0)
Hemoglobin: 16.2 g/dL (ref 13.0–17.0)
Lymphocytes Relative: 30 %
Lymphs Abs: 2.4 K/uL (ref 0.7–4.0)
MCH: 31.2 pg (ref 26.0–34.0)
MCHC: 32.7 g/dL (ref 30.0–36.0)
MCV: 95.4 fL (ref 78.0–100.0)
Monocytes Absolute: 0.7 K/uL (ref 0.1–1.0)
Monocytes Relative: 9 %
Neutro Abs: 4.7 K/uL (ref 1.7–7.7)
Neutrophils Relative %: 60 %
Platelets: 222 K/uL (ref 150–400)
RBC: 5.19 MIL/uL (ref 4.22–5.81)
RDW: 12.5 % (ref 11.5–15.5)
WBC: 7.9 K/uL (ref 4.0–10.5)

## 2018-07-16 LAB — GLUCOSE, CAPILLARY
Glucose-Capillary: 188 mg/dL — ABNORMAL HIGH (ref 70–99)
Glucose-Capillary: 223 mg/dL — ABNORMAL HIGH (ref 70–99)

## 2018-07-16 LAB — COMPREHENSIVE METABOLIC PANEL
ALK PHOS: 71 U/L (ref 38–126)
ALT: 18 U/L (ref 0–44)
AST: 15 U/L (ref 15–41)
Albumin: 3.7 g/dL (ref 3.5–5.0)
Anion gap: 6 (ref 5–15)
BUN: 5 mg/dL — ABNORMAL LOW (ref 6–20)
CALCIUM: 8.8 mg/dL — AB (ref 8.9–10.3)
CO2: 32 mmol/L (ref 22–32)
CREATININE: 0.88 mg/dL (ref 0.61–1.24)
Chloride: 102 mmol/L (ref 98–111)
Glucose, Bld: 115 mg/dL — ABNORMAL HIGH (ref 70–99)
Potassium: 4.1 mmol/L (ref 3.5–5.1)
Sodium: 140 mmol/L (ref 135–145)
TOTAL PROTEIN: 7.1 g/dL (ref 6.5–8.1)
Total Bilirubin: 0.6 mg/dL (ref 0.3–1.2)

## 2018-07-16 MED ORDER — HYDROCHLOROTHIAZIDE 25 MG PO TABS
12.5000 mg | ORAL_TABLET | Freq: Every day | ORAL | Status: DC
  Filled 2018-07-16: qty 1

## 2018-07-16 MED ORDER — SODIUM CHLORIDE 0.9% FLUSH: 3.0000 mL | INTRAVENOUS | Status: DC | PRN

## 2018-07-16 MED ORDER — ALBUTEROL SULFATE (2.5 MG/3ML) 0.083% IN NEBU: 2.5000 mg | INHALATION_SOLUTION | RESPIRATORY_TRACT | Status: DC | PRN

## 2018-07-16 MED ORDER — IPRATROPIUM-ALBUTEROL 0.5-2.5 (3) MG/3ML IN SOLN
3.0000 mL | Freq: Once | RESPIRATORY_TRACT | Status: AC
  Administered 2018-07-16: 3 mL via RESPIRATORY_TRACT
  Filled 2018-07-16: qty 3

## 2018-07-16 MED ORDER — INSULIN ASPART 100 UNIT/ML ~~LOC~~ SOLN
0.0000 [IU] | Freq: Three times a day (TID) | SUBCUTANEOUS | Status: DC
  Administered 2018-07-16: 2 [IU] via SUBCUTANEOUS

## 2018-07-16 MED ORDER — NICOTINE POLACRILEX 2 MG MT GUM: 2.0000 mg | CHEWING_GUM | OROMUCOSAL | Status: DC | PRN

## 2018-07-16 MED ORDER — METHYLPREDNISOLONE SODIUM SUCC 40 MG IJ SOLR
40.0000 mg | Freq: Three times a day (TID) | INTRAMUSCULAR | Status: DC
  Administered 2018-07-16 – 2018-07-17 (×3): 40 mg via INTRAVENOUS
  Filled 2018-07-16 (×3): qty 1

## 2018-07-16 MED ORDER — OXYCODONE HCL 5 MG PO TABS
10.0000 mg | ORAL_TABLET | ORAL | Status: DC | PRN
  Administered 2018-07-16 (×2): 10 mg via ORAL
  Filled 2018-07-16 (×2): qty 2

## 2018-07-16 MED ORDER — TIOTROPIUM BROMIDE MONOHYDRATE 18 MCG IN CAPS
18.0000 ug | ORAL_CAPSULE | Freq: Every day | RESPIRATORY_TRACT | Status: DC
  Administered 2018-07-16 – 2018-07-17 (×2): 18 ug via RESPIRATORY_TRACT
  Filled 2018-07-16: qty 5

## 2018-07-16 MED ORDER — IOHEXOL 300 MG/ML  SOLN
75.0000 mL | Freq: Once | INTRAMUSCULAR | Status: AC | PRN
  Administered 2018-07-16: 75 mL via INTRAVENOUS

## 2018-07-16 MED ORDER — SENNOSIDES-DOCUSATE SODIUM 8.6-50 MG PO TABS
2.0000 | ORAL_TABLET | Freq: Two times a day (BID) | ORAL | Status: DC
  Administered 2018-07-16: 2 via ORAL
  Filled 2018-07-16 (×3): qty 2

## 2018-07-16 MED ORDER — HYDRALAZINE HCL 20 MG/ML IJ SOLN: 10.0000 mg | Freq: Four times a day (QID) | INTRAMUSCULAR | Status: DC | PRN

## 2018-07-16 MED ORDER — ACETAMINOPHEN 500 MG PO TABS: 500.0000 mg | ORAL_TABLET | Freq: Four times a day (QID) | ORAL | Status: DC | PRN

## 2018-07-16 MED ORDER — TRAZODONE HCL 50 MG PO TABS: 50.0000 mg | ORAL_TABLET | Freq: Every evening | ORAL | Status: DC | PRN

## 2018-07-16 MED ORDER — METHYLPREDNISOLONE SODIUM SUCC 125 MG IJ SOLR
125.0000 mg | Freq: Once | INTRAMUSCULAR | Status: AC
  Administered 2018-07-16: 125 mg via INTRAVENOUS
  Filled 2018-07-16: qty 2

## 2018-07-16 MED ORDER — IPRATROPIUM-ALBUTEROL 0.5-2.5 (3) MG/3ML IN SOLN
3.0000 mL | Freq: Four times a day (QID) | RESPIRATORY_TRACT | Status: DC
  Administered 2018-07-16 – 2018-07-17 (×3): 3 mL via RESPIRATORY_TRACT
  Filled 2018-07-16 (×3): qty 3

## 2018-07-16 MED ORDER — POLYETHYLENE GLYCOL 3350 17 G PO PACK: 17.0000 g | PACK | Freq: Every day | ORAL | Status: DC | PRN

## 2018-07-16 MED ORDER — GUAIFENESIN ER 600 MG PO TB12
600.0000 mg | ORAL_TABLET | Freq: Two times a day (BID) | ORAL | Status: DC
  Administered 2018-07-16 (×2): 600 mg via ORAL
  Filled 2018-07-16 (×3): qty 1

## 2018-07-16 MED ORDER — ACETAMINOPHEN 650 MG RE SUPP: 650.0000 mg | Freq: Four times a day (QID) | RECTAL | Status: DC | PRN

## 2018-07-16 MED ORDER — ONDANSETRON HCL 4 MG PO TABS: 4.0000 mg | ORAL_TABLET | Freq: Four times a day (QID) | ORAL | Status: DC | PRN

## 2018-07-16 MED ORDER — ACETAMINOPHEN 325 MG PO TABS: 650.0000 mg | ORAL_TABLET | Freq: Four times a day (QID) | ORAL | Status: DC | PRN

## 2018-07-16 MED ORDER — SODIUM CHLORIDE 0.9 % IV SOLN: 250.0000 mL | INTRAVENOUS | Status: DC | PRN

## 2018-07-16 MED ORDER — HEPARIN SODIUM (PORCINE) 5000 UNIT/ML IJ SOLN
5000.0000 [IU] | Freq: Three times a day (TID) | INTRAMUSCULAR | Status: DC
  Administered 2018-07-16 – 2018-07-17 (×3): 5000 [IU] via SUBCUTANEOUS
  Filled 2018-07-16 (×3): qty 1

## 2018-07-16 MED ORDER — ONDANSETRON HCL 4 MG/2ML IJ SOLN: 4.0000 mg | Freq: Four times a day (QID) | INTRAMUSCULAR | Status: DC | PRN

## 2018-07-16 MED ORDER — DOXYCYCLINE HYCLATE 100 MG PO TABS
100.0000 mg | ORAL_TABLET | Freq: Two times a day (BID) | ORAL | Status: DC
  Administered 2018-07-16 (×2): 100 mg via ORAL
  Filled 2018-07-16 (×3): qty 1

## 2018-07-16 MED ORDER — GUAIFENESIN ER 600 MG PO TB12
600.0000 mg | ORAL_TABLET | Freq: Three times a day (TID) | ORAL | Status: DC
  Filled 2018-07-16 (×3): qty 1

## 2018-07-16 MED ORDER — NICOTINE 7 MG/24HR TD PT24
7.0000 mg | MEDICATED_PATCH | Freq: Every day | TRANSDERMAL | Status: DC
  Administered 2018-07-16: 7 mg via TRANSDERMAL
  Filled 2018-07-16 (×4): qty 1

## 2018-07-16 MED ORDER — SODIUM CHLORIDE 0.9% FLUSH
3.0000 mL | Freq: Two times a day (BID) | INTRAVENOUS | Status: DC
  Administered 2018-07-16 (×2): 3 mL via INTRAVENOUS

## 2018-07-16 NOTE — ED Notes (Signed)
Have paged respiratory

## 2018-07-16 NOTE — H&P (Addendum)
Patient Demographics:    Miguel Marsh, is a 57 y.o. male  MRN: [id number]   DOB - 1960-11-29  Admit Date - 07/16/2018  Outpatient Primary MD for the patient is [name], DO   Assessment & Plan:    Principal Problem:   COPD with acute exacerbation [facility]) Active Problems:   Tobacco use disorder   Essential hypertension   Acute on chronic respiratory failure with hypoxia (HCC)    1)Acute COPD Exacerbation- no definite pneumonia,  treat empirically with IV Solu-Medrol 40 mg every 8 hours, give mucolytics, antibiotics and bronchodilators as ordered, doxycycline 100 mg twice daily, patient requiring 4 L of oxygen at this time prior to admission used 2 L of oxygen via nasal cannula continuously at home , patient is a pink puffer, pursing his lips as he breathes, he has some conversational dyspnea  2) tobacco abuse--  Smoking cessation counseling for 4 minutes today, give nicotine patch  3) acute on chronic hypoxic respiratory failure----in the patient with underlying history of lung cancer and COPD with ongoing tobacco use worsening hypoxia secondary to COPD flareup as above #1  4)HTN--stable, HCTZ 12.5 mg daily,  may use IV Hydralazine 10 mg  Every 4 hours Prn for systolic blood pressure over 170 mmhg  5)Obesity/Snoring/hypersomnolence and daytime fatigue-patient advised to have PCP arranged with study as outpatient  6)H/o non-small cell left lung cancer--diagnosed June 21, 2015, status post prior chemo/immunotherapy, patient follows at [facility]  7) history of glucose intolerance- last A1c 6.6, use sliding scale with Accu-Cheks while on steroids  With History of - Reviewed by me  Past Medical History:  Diagnosis Date  . Arthritis   . Bronchitis   . Cancer [facility])    metastatic  NSCLC (followed by [facility])  . COPD (chronic obstructive pulmonary disease) ([place])   . HTN (hypertension)       Past Surgical History:  Procedure Laterality Date  . LUNG BIOPSY    . TOTAL HIP ARTHROPLASTY    . TUMOR REMOVAL Right 12/04/2017      Chief Complaint  Patient presents with  . Shortness of Breath      HPI:    Miguel Marsh  is a 57 y.o. 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 at [facility] who presents to the ED on 07/16/2018 with increasing shortness of breath, coughing spells, and dyspnea on exertion  No fevers, no chills, no pleuritic symptoms no leg swelling or leg pains  In ED--- CT chest with contrast demonstrated bronchitic wall thickening diffusely with tree in the body nodularity which appears to be similar to prior findings, please note the patient also had a CT chest for staging at [facility] about 5 days ago  In  the ED patient received repeated doses of bronchodilators/nebulizers and remains very symptomatic, attempt to ambulate patient with oxygen resulted in further hypoxia with O2 sats down to 85-86 even on 4 L patient uses 2 L at home previously (PTA),   No vomiting or diarrhea  patient is a pink puffer, pursing his lips as he breathes    Review of systems:    In addition to the HPI above,   A full Review of  Systems was done, all other systems reviewed are negative except as noted above in HPI , .   Social History:  Reviewed by me    Social History   Tobacco Use  . Smoking status: Current Every Day Smoker    Packs/day: 0.10    Types: Cigarettes  . Smokeless tobacco: Never Used  Substance Use Topics  . Alcohol use: Yes    Comment: occ     Family History :  Reviewed by me  HTN   Home Medications:     Prior to Admission medications   Medication Sig Start Date End Date Taking? Authorizing Provider  albuterol (PROVENTIL HFA;VENTOLIN HFA) 108 (90 BASE) MCG/ACT inhaler Inhale 2 puffs into the lungs every 6 (six) hours as needed for wheezing.   Yes [provider]  albuterol (PROVENTIL) (2.5 MG/3ML) 0.083% nebulizer solution Take 2.5 mg by nebulization every 4 (four) hours as needed for wheezing or shortness of breath.  11/22/15  Yes [provider]  hydrochlorothiazide (HYDRODIURIL) 25 MG tablet Take 25 mg by mouth daily.  12/02/14  Yes [provider]  nicotine polacrilex (NICORETTE) 2 MG gum Take 2 mg by mouth as needed for smoking cessation.   Yes [provider]  Oxycodone HCl 10 MG TABS Take 10 mg by mouth every 4 (four) hours as needed (pain).  07/12/15  Yes [provider]  senna-docusate (SENOKOT-S) 8.6-50 MG per tablet Take 2 tablets by mouth at bedtime as needed for mild constipation.  06/15/15  Yes [provider]  SYMBICORT 80-4.5 MCG/ACT inhaler Inhale 2 puffs into the lungs 2 (two) times daily. 12/02/14  Yes [provider]  tiotropium (SPIRIVA) 18 MCG inhalation capsule Place 18 mcg into inhaler and inhale daily.   Yes [provider]  acetaminophen (TYLENOL) 500 MG tablet Take 500 mg by mouth every 6 (six) hours as needed for mild pain.    [provider]  guaiFENesin (MUCINEX) 600 MG 12 hr tablet Take 1 tablet (600 mg total) by mouth 2 (two) times daily. Patient not taking: Reported on 07/16/2018 12/12/15   Kathie Dike, MD  predniSONE (DELTASONE) 10 MG tablet Take 6 tablets (60 mg total) by mouth daily with breakfast. And decrease by one tablet every 2 days Patient not taking: Reported on 07/16/2018 03/23/18   [name], MD     Allergies:    No Known Allergies   Physical Exam:   Vitals  Blood pressure 134/69, pulse 85, temperature 98.4 F (36.9 C), temperature source Oral, resp. rate (!) 21,  height 5\' 7"  (1.702 m), weight 113.4 kg, SpO2 94 %.  Physical Examination:  General appearance - alert, obese appearing, some conversational dyspnea Mouth/Face--patient is a pink puffer, pursing his lips as he breathes Mental status - alert, oriented to person, place, and time,  Nose- [place] 4 L/min Eyes - sclera anicteric Neck - supple, no JVD elevation , Chest -diminished in bases, few scattered wheezes and rhonchi, no rales  heart - S1 and S2 normal,  Abdomen - soft,  nontender, nondistended, increased truncal adiposity neurological - screening mental status exam normal, neck supple without rigidity, cranial nerves II through XII intact, DTR's normal and symmetric Extremities - no pedal edema noted, intact peripheral pulses  Skin - warm, dry     Data Review:    CBC Recent Labs  Lab 07/16/18 1021  WBC 7.9  HGB 16.2  HCT 49.5  PLT 222  MCV 95.4  MCH 31.2  MCHC 32.7  RDW 12.5  LYMPHSABS 2.4  MONOABS 0.7  EOSABS 0.1  BASOSABS 0.0   ------------------------------------------------------------------------------------------------------------------  Chemistries  Recent Labs  Lab 07/16/18 1021  NA 140  K 4.1  CL 102  CO2 32  GLUCOSE 115*  BUN 5*  CREATININE 0.88  CALCIUM 8.8*  AST 15  ALT 18  ALKPHOS 71  BILITOT 0.6   ------------------------------------------------------------------------------------------------------------------ estimated creatinine clearance is 111.3 mL/min (by C-G formula based on SCr of 0.88 mg/dL). ------------------------------------------------------------------------------------------------------------------ No results for input(s): TSH, T4TOTAL, T3FREE, THYROIDAB in the last 72 hours.  Invalid input(s): FREET3   Coagulation profile No results for input(s): INR, PROTIME in the last 168 hours. ------------------------------------------------------------------------------------------------------------------- No results for input(s):  DDIMER in the last 72 hours. -------------------------------------------------------------------------------------------------------------------  Cardiac Enzymes No results for input(s): CKMB, TROPONINI, MYOGLOBIN in the last 168 hours.  Invalid input(s): CK ------------------------------------------------------------------------------------------------------------------    Component Value Date/Time   BNP 23.0 03/18/2018 1408     ---------------------------------------------------------------------------------------------------------------  Urinalysis    Component Value Date/Time   COLORURINE YELLOW 01/22/2017 1524   APPEARANCEUR CLOUDY (A) 01/22/2017 1524   LABSPEC 1.021 01/22/2017 1524   PHURINE 7.0 01/22/2017 1524   GLUCOSEU NEGATIVE 01/22/2017 1524   HGBUR NEGATIVE 01/22/2017 1524   [place] 01/22/2017 1524   KETONESUR NEGATIVE 01/22/2017 1524   PROTEINUR NEGATIVE 01/22/2017 1524   UROBILINOGEN 0.2 10/21/2013 0200   NITRITE NEGATIVE 01/22/2017 1524   LEUKOCYTESUR NEGATIVE 01/22/2017 1524    ----------------------------------------------------------------------------------------------------------------   Imaging Results:    Dg Chest 2 View  Result Date: 07/16/2018 CLINICAL DATA:  Increasing difficulty breathing over the past week with increased severity yesterday and today. History of metastatic non-small cell lung malignancy with mass removal on the right in February 2019. History of COPD, current smoker. EXAM: CHEST - 2 VIEW COMPARISON:  PA and lateral chest x-ray of Mar 18, 2018 FINDINGS: The lungs are mildly hyperinflated. There is new increased density at the left lung base likely in the lingula. There is no pleural effusion. The heart and pulmonary vascularity are normal. There are surgical clips in the AP window region. The bony thorax is unremarkable. IMPRESSION: COPD. Increased density at the right lung base likely in the lingula worrisome for  malignancy. CT scanning of the chest is recommended. Electronically Signed   By: David  Martinique M.D.   On: 07/16/2018 10:19   Ct Chest W Contrast  Result Date: 07/16/2018 CLINICAL DATA:  Lung nodule.  Difficulty breathing for a week. EXAM: CT CHEST WITH CONTRAST TECHNIQUE: Multidetector CT imaging of the chest was performed during intravenous contrast administration. CONTRAST:  7mL OMNIPAQUE IOHEXOL 300 MG/ML  SOLN COMPARISON:  No CT comparison.  Chest x-ray 07/16/2018 FINDINGS: Cardiovascular: Normal heart size. No pericardial effusion. LAD atherosclerotic calcification. Mediastinum/Nodes: Negative for adenopathy or inflammation. Lungs/Pleura: History of left lung cancer and treatment. There was a staging CT at [facility] 5 days prior read as no evidence of malignancy. I do not have any comparison chest CT images available. There is generalized airway thickening. Patchy tree in bud micro nodularity, greatest in  the right lower lobe, chronic per report. The area of chest x-ray abnormality has a bandlike appearance favoring post treatment scarring. Ground-glass opacity suspected to reflect pneumonia on recent CT; no consolidating pneumonia today. Upper Abdomen: Hepatic steatosis and cholelithiasis. Musculoskeletal: No acute finding. IMPRESSION: 1. Chest x-ray abnormality is likely scarring, possibly the patient's lung cancer resection site. The patient had a staging chest CT at [facility] 5 days ago. 2. Bronchitic wall thickening diffusely. There is tree-in-bud nodularity greatest in the right lower lobe, per report chronic and likely from chronic atypical infection. Electronically Signed   By: Monte Fantasia M.D.   On: 07/16/2018 12:48    Radiological Exams on Admission: Dg Chest 2 View  Result Date: 07/16/2018 CLINICAL DATA:  Increasing difficulty breathing over the past week with increased severity yesterday and today. History of metastatic non-small cell lung malignancy with mass removal on the right  in February 2019. History of COPD, current smoker. EXAM: CHEST - 2 VIEW COMPARISON:  PA and lateral chest x-ray of Mar 18, 2018 FINDINGS: The lungs are mildly hyperinflated. There is new increased density at the left lung base likely in the lingula. There is no pleural effusion. The heart and pulmonary vascularity are normal. There are surgical clips in the AP window region. The bony thorax is unremarkable. IMPRESSION: COPD. Increased density at the right lung base likely in the lingula worrisome for malignancy. CT scanning of the chest is recommended. Electronically Signed   By: David  Martinique M.D.   On: 07/16/2018 10:19   Ct Chest W Contrast  Result Date: 07/16/2018 CLINICAL DATA:  Lung nodule.  Difficulty breathing for a week. EXAM: CT CHEST WITH CONTRAST TECHNIQUE: Multidetector CT imaging of the chest was performed during intravenous contrast administration. CONTRAST:  5mL OMNIPAQUE IOHEXOL 300 MG/ML  SOLN COMPARISON:  No CT comparison.  Chest x-ray 07/16/2018 FINDINGS: Cardiovascular: Normal heart size. No pericardial effusion. LAD atherosclerotic calcification. Mediastinum/Nodes: Negative for adenopathy or inflammation. Lungs/Pleura: History of left lung cancer and treatment. There was a staging CT at [facility] 5 days prior read as no evidence of malignancy. I do not have any comparison chest CT images available. There is generalized airway thickening. Patchy tree in bud micro nodularity, greatest in the right lower lobe, chronic per report. The area of chest x-ray abnormality has a bandlike appearance favoring post treatment scarring. Ground-glass opacity suspected to reflect pneumonia on recent CT; no consolidating pneumonia today. Upper Abdomen: Hepatic steatosis and cholelithiasis. Musculoskeletal: No acute finding. IMPRESSION: 1. Chest x-ray abnormality is likely scarring, possibly the patient's lung cancer resection site. The patient had a staging chest CT at [facility] 5 days ago. 2. Bronchitic  wall thickening diffusely. There is tree-in-bud nodularity greatest in the right lower lobe, per report chronic and likely from chronic atypical infection. Electronically Signed   By: Monte Fantasia M.D.   On: 07/16/2018 12:48    DVT Prophylaxis -SCD/Heparin AM Labs Ordered, also please review Full Orders  Family Communication: Admission, patients condition and plan of care including tests being ordered have been discussed with the patient who indicate understanding and agree with the plan   Code Status - Full Code  Likely DC to  Home   Condition   stable  Roxan Hockey M.D on 07/16/2018 at 3:13 PM Pager---[phone] Go to [email or web address] - [email or web address] for contact info  [facility] - Office  [phone]

## 2018-07-16 NOTE — ED Notes (Signed)
Pt to xray

## 2018-07-16 NOTE — ED Provider Notes (Signed)
[facility] EMERGENCY DEPARTMENT Provider Note   CSN: [id number] Arrival date & time: 07/16/18  4854     History   Chief Complaint Chief Complaint  Patient presents with  . Shortness of Breath    HPI Miguel Marsh is a 57 y.o. male.  Patient brought in by EMS.  Patient stating that he has been having some difficulty breathing for about a week but is gotten worse in the last 2 days.  Normally he is on 2 L of oxygen.  EMS noted that he had decreased breath sounds more so on the right and left.  Patient does have a history of lung cancer had tumor removed in February and is followed by [facility] for this.  Patient's also had decreased appetite.  The main thing is he is has significant exertional shortness of breath.  No chest pain.  Patient not currently on prednisone or any other steroids.  Patient does have albuterol inhaler that he uses at home.  As stated above normally on 2 L of oxygen all the time.  EMS brought him in on 4 L.  On that he satting in the low 90s.     Past Medical History:  Diagnosis Date  . Arthritis   . Bronchitis   . Cancer [facility])    metastatic NSCLC (followed by [facility])  . COPD (chronic obstructive pulmonary disease) ([place])   . HTN (hypertension)     Patient Active Problem List   Diagnosis Date Noted  . Acute respiratory failure ([place]) 01/23/2018  . Acute on chronic respiratory failure with hypoxia and hypercapnia ([place]) 01/23/2018  . Acute on chronic respiratory failure with hypoxia ([place]) 01/01/2018  . Obesity, Class III, BMI 40-49.9 (morbid obesity) ([place]) 01/01/2018  . COPD with exacerbation ([place]) 01/01/2018  . Essential hypertension 12/09/2015  . Non-small cell cancer of right lung ([place]) 12/09/2015  . GERD (gastroesophageal reflux disease) 12/09/2015  . COPD with acute exacerbation ([place]) 12/17/2014  . Acute on chronic respiratory failure ([place]) 12/17/2014  . Tobacco use disorder 12/17/2014  . COPD exacerbation ([place]) 12/17/2014  . Obesity 12/17/2014  .  Dyspnea   . Difficulty in walking(719.7) 04/02/2013  . Hip weakness 04/02/2013    Past Surgical History:  Procedure Laterality Date  . LUNG BIOPSY    . TOTAL HIP ARTHROPLASTY    . TUMOR REMOVAL Right 12/04/2017        Home Medications    Prior to Admission medications   Medication Sig Start Date End Date Taking? Authorizing Provider  albuterol (PROVENTIL HFA;VENTOLIN HFA) 108 (90 BASE) MCG/ACT inhaler Inhale 2 puffs into the lungs every 6 (six) hours as needed for wheezing.   Yes [provider]  albuterol (PROVENTIL) (2.5 MG/3ML) 0.083% nebulizer solution Take 2.5 mg by nebulization every 4 (four) hours as needed for wheezing or shortness of breath.  11/22/15  Yes [provider]  hydrochlorothiazide (HYDRODIURIL) 25 MG tablet Take 25 mg by mouth daily.  12/02/14  Yes [provider]  nicotine polacrilex (NICORETTE) 2 MG gum Take 2 mg by mouth as needed for smoking cessation.   Yes [provider]  Oxycodone HCl 10 MG TABS Take 10 mg by mouth every 4 (four) hours as needed (pain).  07/12/15  Yes [provider]  senna-docusate (SENOKOT-S) 8.6-50 MG per tablet Take 2 tablets by mouth at bedtime as needed for mild constipation.  06/15/15  Yes [provider]  SYMBICORT 80-4.5 MCG/ACT inhaler Inhale 2 puffs into the lungs 2 (two) times  daily. 12/02/14  Yes [provider]  tiotropium (SPIRIVA) 18 MCG inhalation capsule Place 18 mcg into inhaler and inhale daily.   Yes [provider]  acetaminophen (TYLENOL) 500 MG tablet Take 500 mg by mouth every 6 (six) hours as needed for mild pain.    [provider]  guaiFENesin (MUCINEX) 600 MG 12 hr tablet Take 1 tablet (600 mg total) by mouth 2 (two) times daily. Patient not taking: Reported on 07/16/2018 12/12/15   Kathie Dike, MD  predniSONE (DELTASONE) 10 MG tablet Take 6 tablets (60 mg total) by mouth daily with breakfast. And decrease by one tablet every 2  days Patient not taking: Reported on 07/16/2018 03/23/18   [name], MD    Family History No family history on file.  Social History Social History   Tobacco Use  . Smoking status: Current Every Day Smoker    Packs/day: 0.10    Types: Cigarettes  . Smokeless tobacco: Never Used  Substance Use Topics  . Alcohol use: Yes    Comment: occ  . Drug use: No    Types: Marijuana, Cocaine    Comment: former     Allergies   Patient has no known allergies.   Review of Systems Review of Systems  Constitutional: Positive for fatigue. Negative for fever.  HENT: Negative for congestion.   Eyes: Negative for redness.  Respiratory: Positive for shortness of breath. Negative for wheezing.   Cardiovascular: Negative for chest pain.  Gastrointestinal: Negative for abdominal pain.  Genitourinary: Negative for dysuria.  Musculoskeletal: Negative for back pain.  Skin: Negative for rash.  Neurological: Negative for headaches.  Hematological: Does not bruise/bleed easily.  Psychiatric/Behavioral: Negative for confusion.     Physical Exam Updated Vital Signs BP 134/69   Pulse 85   Temp 98.4 F (36.9 C) (Oral)   Resp (!) 21   Ht 1.702 m (5\' 7" )   Wt 113.4 kg   SpO2 94%   BMI 39.16 kg/m   Physical Exam  Constitutional: He is oriented to person, place, and time. He appears well-developed and well-nourished. No distress.  HENT:  Head: Normocephalic and atraumatic.  Mouth/Throat: Oropharynx is clear and moist.  Eyes: Pupils are equal, round, and reactive to light. Conjunctivae and EOM are normal.  Neck: Neck supple.  Cardiovascular: Normal rate, regular rhythm and normal heart sounds.  Pulmonary/Chest: Effort normal and breath sounds normal. No respiratory distress. He has no wheezes.  He did have decreased breath sounds bilaterally.  But no wheezing.  Abdominal: Soft. Bowel sounds are normal. There is no tenderness.  Musculoskeletal: Normal range of motion. He exhibits no edema.   Neurological: He is alert and oriented to person, place, and time. No cranial nerve deficit or sensory deficit. He exhibits normal muscle tone. Coordination normal.  Skin: Skin is warm.  Nursing note and vitals reviewed.    ED Treatments / Results  Labs (all labs ordered are listed, but only abnormal results are displayed) Labs Reviewed  COMPREHENSIVE METABOLIC PANEL - Abnormal; Notable for the following components:      Result Value   Glucose, Bld 115 (*)    BUN 5 (*)    Calcium 8.8 (*)    All other components within normal limits  CBC WITH DIFFERENTIAL/PLATELET    EKG EKG Interpretation  Date/Time:  Tuesday July 16 2018 09:36:24 EDT Ventricular Rate:  80 PR Interval:    QRS Duration: 78 QT Interval:  370 QTC Calculation: 427 R Axis:   27  Text Interpretation:  Sinus rhythm Nonspecific T abnormalities, lateral leads No significant change since last tracing Interpretation limited secondary to artifact Confirmed by Fredia Sorrow [phone]) on 07/16/2018 11:17:29 AM   Radiology Dg Chest 2 View  Result Date: 07/16/2018 CLINICAL DATA:  Increasing difficulty breathing over the past week with increased severity yesterday and today. History of metastatic non-small cell lung malignancy with mass removal on the right in February 2019. History of COPD, current smoker. EXAM: CHEST - 2 VIEW COMPARISON:  PA and lateral chest x-ray of Mar 18, 2018 FINDINGS: The lungs are mildly hyperinflated. There is new increased density at the left lung base likely in the lingula. There is no pleural effusion. The heart and pulmonary vascularity are normal. There are surgical clips in the AP window region. The bony thorax is unremarkable. IMPRESSION: COPD. Increased density at the right lung base likely in the lingula worrisome for malignancy. CT scanning of the chest is recommended. Electronically Signed   By: David  Martinique M.D.   On: 07/16/2018 10:19   Ct Chest W Contrast  Result Date:  07/16/2018 CLINICAL DATA:  Lung nodule.  Difficulty breathing for a week. EXAM: CT CHEST WITH CONTRAST TECHNIQUE: Multidetector CT imaging of the chest was performed during intravenous contrast administration. CONTRAST:  79mL OMNIPAQUE IOHEXOL 300 MG/ML  SOLN COMPARISON:  No CT comparison.  Chest x-ray 07/16/2018 FINDINGS: Cardiovascular: Normal heart size. No pericardial effusion. LAD atherosclerotic calcification. Mediastinum/Nodes: Negative for adenopathy or inflammation. Lungs/Pleura: History of left lung cancer and treatment. There was a staging CT at [facility] 5 days prior read as no evidence of malignancy. I do not have any comparison chest CT images available. There is generalized airway thickening. Patchy tree in bud micro nodularity, greatest in the right lower lobe, chronic per report. The area of chest x-ray abnormality has a bandlike appearance favoring post treatment scarring. Ground-glass opacity suspected to reflect pneumonia on recent CT; no consolidating pneumonia today. Upper Abdomen: Hepatic steatosis and cholelithiasis. Musculoskeletal: No acute finding. IMPRESSION: 1. Chest x-ray abnormality is likely scarring, possibly the patient's lung cancer resection site. The patient had a staging chest CT at [facility] 5 days ago. 2. Bronchitic wall thickening diffusely. There is tree-in-bud nodularity greatest in the right lower lobe, per report chronic and likely from chronic atypical infection. Electronically Signed   By: Monte Fantasia M.D.   On: 07/16/2018 12:48    Procedures Procedures (including critical care time)  Medications Ordered in ED Medications  methylPREDNISolone sodium succinate (SOLU-MEDROL) 40 mg/mL injection 40 mg (has no administration in time range)  ipratropium-albuterol (DUONEB) 0.5-2.5 (3) MG/3ML nebulizer solution 3 mL (has no administration in time range)  guaiFENesin (MUCINEX) 12 hr tablet 600 mg (has no administration in time range)  nicotine (NICODERM CQ -  dosed in mg/24 hr) patch 7 mg (has no administration in time range)  doxycycline (VIBRA-TABS) tablet 100 mg (has no administration in time range)  methylPREDNISolone sodium succinate (SOLU-MEDROL) 125 mg/2 mL injection 125 mg (125 mg Intravenous Given 07/16/18 0950)  ipratropium-albuterol (DUONEB) 0.5-2.5 (3) MG/3ML nebulizer solution 3 mL (3 mLs Nebulization Given 07/16/18 1013)  ipratropium-albuterol (DUONEB) 0.5-2.5 (3) MG/3ML nebulizer solution 3 mL (3 mLs Nebulization Given 07/16/18 1239)  iohexol (OMNIPAQUE) 300 MG/ML solution 75 mL (75 mLs Intravenous Contrast Given 07/16/18 1229)     Initial Impression / Assessment and Plan / ED Course  I have reviewed the triage vital signs and the nursing notes.  Pertinent labs & imaging results that were available  during my care of the patient were reviewed by me and considered in my medical decision making (see chart for details).     Patient of exacerbation of COPD.  Never had wheezing but poor air movement.  Patient received albuterol Atrovent nebulizers x2 and Solu-Medrol.  After the first albuterol Atrovent nebulizer patient was able to have his oxygen reduced down to his normal 2L nasal cannula.  He came in on 4 L.  However following a second nebulizer treatment patient was ambulated and he would desat into the 80s and also became very symptomatic with any kind of exertion.  Chest x-ray without any acute findings over the raise some concern about a lingular mass.  So they recommended CT chest.  This was done.  Shows that scar tissue.  And radiology recognize that patient had a CT staging CAT scan just 5 days ago at [facility].  This was done because patient had a lung CA resection back in February and is followed at [facility] for this.  So in reality he did not need a repeat CT scan.  Discussed with hospitalist they will admit.  Patient at rest and on the 2 L of oxygen is satting in the mid to low 90s.  And is comfortable.  Also patient's chart review  showed that he is admitted frequently for COPD exacerbations.  Most recently in May.  Final Clinical Impressions(s) / ED Diagnoses   Final diagnoses:  COPD exacerbation [facility])    ED Discharge Orders    None       Fredia Sorrow, MD 07/16/18 [phone]

## 2018-07-16 NOTE — ED Notes (Signed)
Admitting MD at bedside.

## 2018-07-16 NOTE — ED Triage Notes (Signed)
Pt has been having difficulty breathing over the last week but worse today and yesterday. Chronic O2 2L. [place] CO2 29. Decreased lung sounds on right side. Had a tumor removed in February from the center of his chest. Loss of appetite.

## 2018-07-16 NOTE — ED Notes (Signed)
Pt did not tolerate walking well at all. O2 sats on 3L dropped to 86% after doing 1 lap around the nurse's station.

## 2018-07-16 NOTE — ED Notes (Signed)
Patient transported to CT 

## 2018-07-16 NOTE — ED Notes (Signed)
Pt now eating a meal tray

## 2018-07-17 DIAGNOSIS — J441 Chronic obstructive pulmonary disease with (acute) exacerbation: Secondary | ICD-10-CM | POA: Diagnosis not present

## 2018-07-17 LAB — BASIC METABOLIC PANEL
ANION GAP: 8 (ref 5–15)
BUN: 8 mg/dL (ref 6–20)
CHLORIDE: 102 mmol/L (ref 98–111)
CO2: 28 mmol/L (ref 22–32)
Calcium: 9.6 mg/dL (ref 8.9–10.3)
Creatinine, Ser: 0.78 mg/dL (ref 0.61–1.24)
GFR calc non Af Amer: >60 mL/min (ref >60)
Glucose, Bld: 145 mg/dL — ABNORMAL HIGH (ref 70–99)
Potassium: 4.3 mmol/L (ref 3.5–5.1)
Sodium: 138 mmol/L (ref 135–145)

## 2018-07-17 LAB — CBC
HCT: 47.2 % (ref 39.0–52.0)
HEMOGLOBIN: 15.3 g/dL (ref 13.0–17.0)
MCH: 30.8 pg (ref 26.0–34.0)
MCHC: 32.4 g/dL (ref 30.0–36.0)
MCV: 95 fL (ref 78.0–100.0)
Platelets: 231 10*3/uL (ref 150–400)
RBC: 4.97 MIL/uL (ref 4.22–5.81)
RDW: 12.5 % (ref 11.5–15.5)
WBC: 15.2 10*3/uL — ABNORMAL HIGH (ref 4.0–10.5)

## 2018-07-17 LAB — GLUCOSE, CAPILLARY: Glucose-Capillary: 120 mg/dL — ABNORMAL HIGH (ref 70–99)

## 2018-07-17 MED ORDER — SYMBICORT 80-4.5 MCG/ACT IN AERO: 2.0000 | INHALATION_SPRAY | Freq: Two times a day (BID) | RESPIRATORY_TRACT | 11 refills | Status: DC

## 2018-07-17 MED ORDER — PREDNISONE 50 MG PO TABS: 50.0000 mg | ORAL_TABLET | Freq: Every day | ORAL | 0 refills | Status: DC

## 2018-07-17 MED ORDER — GUAIFENESIN ER 600 MG PO TB12: 600.0000 mg | ORAL_TABLET | Freq: Two times a day (BID) | ORAL | 0 refills | Status: DC

## 2018-07-17 MED ORDER — ALBUTEROL SULFATE HFA 108 (90 BASE) MCG/ACT IN AERS: 2.0000 | INHALATION_SPRAY | Freq: Four times a day (QID) | RESPIRATORY_TRACT | 1 refills | Status: DC | PRN

## 2018-07-17 MED ORDER — NICOTINE 7 MG/24HR TD PT24: 7.0000 mg | MEDICATED_PATCH | Freq: Every day | TRANSDERMAL | 0 refills | Status: DC

## 2018-07-17 MED ORDER — SENNOSIDES-DOCUSATE SODIUM 8.6-50 MG PO TABS: 2.0000 | ORAL_TABLET | Freq: Every day | ORAL | 1 refills | Status: DC

## 2018-07-17 MED ORDER — TIOTROPIUM BROMIDE MONOHYDRATE 18 MCG IN CAPS: 18.0000 ug | ORAL_CAPSULE | Freq: Every day | RESPIRATORY_TRACT | 12 refills | Status: DC

## 2018-07-17 MED ORDER — ALBUTEROL SULFATE (2.5 MG/3ML) 0.083% IN NEBU: 2.5000 mg | INHALATION_SOLUTION | RESPIRATORY_TRACT | 12 refills | Status: DC | PRN

## 2018-07-17 MED ORDER — NICOTINE POLACRILEX 2 MG MT GUM: 2.0000 mg | CHEWING_GUM | OROMUCOSAL | 0 refills | Status: DC | PRN

## 2018-07-17 MED ORDER — HYDROCHLOROTHIAZIDE 12.5 MG PO TABS: 12.5000 mg | ORAL_TABLET | Freq: Every day | ORAL | 1 refills | Status: DC

## 2018-07-17 MED ORDER — PREDNISONE 20 MG PO TABS
50.0000 mg | ORAL_TABLET | Freq: Once | ORAL | Status: AC
  Administered 2018-07-17: 50 mg via ORAL
  Filled 2018-07-17: qty 2

## 2018-07-17 MED ORDER — DOXYCYCLINE HYCLATE 100 MG PO TABS: 100.0000 mg | ORAL_TABLET | Freq: Two times a day (BID) | ORAL | 0 refills | Status: AC

## 2018-07-17 NOTE — Discharge Summary (Signed)
Miguel Marsh, is a 57 y.o. male  DOB November 18, 1960  MRN [id number].  Admission date:  07/16/2018  Admitting Physician  Roxan Hockey, MD  Discharge Date:  07/17/2018   Primary MD  [name], DO  Recommendations for primary care physician for things to follow:   1) smoking cessation strongly advised 2) take doxycycline antibiotic and prednisone as prescribed 3) follow-up with [facility] oncology/pulmonary team as scheduled previously on 07/23/2018 4) have your primary care doctor refer you for an outpatient sleep study as you probably have sleep apnea and may need CPAP 5) use oxygen at all times as advised   Admission Diagnosis  COPD exacerbation ([place]) [J44.1]   Discharge Diagnosis  COPD exacerbation ([place]) [J44.1]    Principal Problem:   COPD with acute exacerbation ([place]) Active Problems:   Tobacco use disorder   Essential hypertension   Acute on chronic respiratory failure with hypoxia [facility])      Past Medical History:  Diagnosis Date  . Arthritis   . Bronchitis   . Cancer [facility])    metastatic NSCLC (followed by [facility])  . COPD (chronic obstructive pulmonary disease) ([place])   . HTN (hypertension)     Past Surgical History:  Procedure Laterality Date  . LUNG BIOPSY    . TOTAL HIP ARTHROPLASTY    . TUMOR REMOVAL Right 12/04/2017       HPI  from the history and physical done on the day of admission:     Miguel Marsh  is a 57 y.o. 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 who presents to the ED on 07/16/2018 with increasing shortness of breath, coughing spells, and dyspnea on exertion  No fevers, no chills, no pleuritic symptoms no leg  swelling or leg pains  In ED--- CT chest with contrast demonstrated bronchitic wall thickening diffusely with tree in the body nodularity which appears to be similar to prior findings, please note the patient also had a CT chest for staging at [facility] about 5 days ago  In the ED patient received repeated doses of bronchodilators/nebulizers and remains very symptomatic, attempt to ambulate patient with oxygen resulted in further hypoxia with O2 sats down to 85-86 even on 4 L patient uses 2 L at home previously (PTA),   No vomiting or diarrhea  patient is a pink puffer, pursing his lips as he breathes     Hospital Course:    1)Acute COPD Exacerbation- no definite pneumonia,  treated  with IV Solu-Medrol, mucolytics and bronchodilators as ordered, okay to discharge home on doxycycline 100 mg twice daily and oral prednisone, oxygen requirement is better patient is back to 2 L by nasal cannula which is his baseline, on admission patient was requiring 4 L of oxygen   , patient is a pink puffer,  pursing his lips as he breathes,  no further conversational dyspnea  2) tobacco abuse--  Smoking cessation strongly advised, may use nicotine patch  3) acute on chronic hypoxic respiratory failure----in the patient with underlying history of lung cancer and COPD with ongoing tobacco use worsening hypoxia secondary to COPD flareup as above #1, please see #1 above  4)HTN--stable, HCTZ 12.5 mg daily,   5)Obesity/Snoring/hypersomnolence and daytime fatigue-patient advised to have PCP arranged with study as outpatient  6)H/o non-small cell left lung cancer--diagnosed June 21, 2015, status post prior chemo/immunotherapy, patient follows [facility]  7) history of glucose intolerance- last A1c 6.6, patient to watch his diet while on prednisone due to risk of hypoglycemia  Discharge Condition: stable  Follow UP--- oncologist at [facility] and PCP  locally   Diet and Activity recommendation:  As advised  Discharge Instructions    Discharge Instructions    Call MD for:  difficulty breathing, headache or visual disturbances   Complete by:  As directed    Call MD for:  persistant dizziness or light-headedness   Complete by:  As directed    Call MD for:  persistant nausea and vomiting   Complete by:  As directed    Call MD for:  severe uncontrolled pain   Complete by:  As directed    Call MD for:  temperature >100.4   Complete by:  As directed    Diet - low sodium heart healthy   Complete by:  As directed    Discharge instructions   Complete by:  As directed    1) smoking cessation strongly advised 2) take doxycycline antibiotic and prednisone as prescribed 3) follow-up with [facility] oncology/pulmonary team as scheduled previously on 07/23/2018 4) have your primary care doctor refer you for an outpatient sleep study as you probably have sleep apnea and may need CPAP 5) use oxygen at all times as advised   Increase activity slowly   Complete by:  As directed         Discharge Medications     Allergies as of 07/17/2018   No Known Allergies     Medication List    TAKE these medications   acetaminophen 500 MG tablet Commonly known as:  TYLENOL Take 500 mg by mouth every 6 (six) hours as needed for mild pain.   albuterol 108 (90 Base) MCG/ACT inhaler Commonly known as:  PROVENTIL HFA;VENTOLIN HFA Inhale 2 puffs into the lungs every 6 (six) hours as needed for wheezing.   albuterol (2.5 MG/3ML) 0.083% nebulizer solution Commonly known as:  PROVENTIL Take 3 mLs (2.5 mg total) by nebulization every 4 (four) hours as needed for wheezing or shortness of breath.   doxycycline 100 MG tablet Commonly known as:  VIBRA-TABS Take 1 tablet (100 mg total) by mouth 2 (two) times daily for 7 days.   guaiFENesin 600 MG 12 hr tablet Commonly known as:  MUCINEX Take 1 tablet (600 mg total) by mouth 2 (two) times  daily.   hydrochlorothiazide 12.5 MG tablet Commonly known as:  HYDRODIURIL Take 1 tablet (12.5 mg total) by mouth daily. What changed:    medication strength  how much to take   nicotine 7 mg/24hr patch Commonly known as:  NICODERM CQ - dosed in mg/24 hr Place 1 patch (7 mg total) onto the skin daily.   nicotine polacrilex 2 MG gum Commonly known as:  NICORETTE Take 1 each (2 mg total) by mouth as needed  for smoking cessation.   Oxycodone HCl 10 MG Tabs Take 10 mg by mouth every 4 (four) hours as needed (pain).   predniSONE 50 MG tablet Commonly known as:  DELTASONE Take 1 tablet (50 mg total) by mouth daily with breakfast. What changed:    medication strength  how much to take  additional instructions   senna-docusate 8.6-50 MG tablet Commonly known as:  Senokot-S Take 2 tablets by mouth at bedtime. What changed:    when to take this  reasons to take this   SYMBICORT 80-4.5 MCG/ACT inhaler Generic drug:  budesonide-formoterol Inhale 2 puffs into the lungs 2 (two) times daily.   tiotropium 18 MCG inhalation capsule Commonly known as:  SPIRIVA Place 1 capsule (18 mcg total) into inhaler and inhale daily.       Major procedures and Radiology Reports - PLEASE review detailed and final reports for all details, in brief -   Dg Chest 2 View  Result Date: 07/16/2018 CLINICAL DATA:  Increasing difficulty breathing over the past week with increased severity yesterday and today. History of metastatic non-small cell lung malignancy with mass removal on the right in February 2019. History of COPD, current smoker. EXAM: CHEST - 2 VIEW COMPARISON:  PA and lateral chest x-ray of Mar 18, 2018 FINDINGS: The lungs are mildly hyperinflated. There is new increased density at the left lung base likely in the lingula. There is no pleural effusion. The heart and pulmonary vascularity are normal. There are surgical clips in the AP window region. The bony thorax is unremarkable.  IMPRESSION: COPD. Increased density at the right lung base likely in the lingula worrisome for malignancy. CT scanning of the chest is recommended. Electronically Signed   By: David  Martinique M.D.   On: 07/16/2018 10:19   Ct Chest W Contrast  Result Date: 07/16/2018 CLINICAL DATA:  Lung nodule.  Difficulty breathing for a week. EXAM: CT CHEST WITH CONTRAST TECHNIQUE: Multidetector CT imaging of the chest was performed during intravenous contrast administration. CONTRAST:  16mL OMNIPAQUE IOHEXOL 300 MG/ML  SOLN COMPARISON:  No CT comparison.  Chest x-ray 07/16/2018 FINDINGS: Cardiovascular: Normal heart size. No pericardial effusion. LAD atherosclerotic calcification. Mediastinum/Nodes: Negative for adenopathy or inflammation. Lungs/Pleura: History of left lung cancer and treatment. There was a staging CT at [facility] 5 days prior read as no evidence of malignancy. I do not have any comparison chest CT images available. There is generalized airway thickening. Patchy tree in bud micro nodularity, greatest in the right lower lobe, chronic per report. The area of chest x-ray abnormality has a bandlike appearance favoring post treatment scarring. Ground-glass opacity suspected to reflect pneumonia on recent CT; no consolidating pneumonia today. Upper Abdomen: Hepatic steatosis and cholelithiasis. Musculoskeletal: No acute finding. IMPRESSION: 1. Chest x-ray abnormality is likely scarring, possibly the patient's lung cancer resection site. The patient had a staging chest CT at [facility] 5 days ago. 2. Bronchitic wall thickening diffusely. There is tree-in-bud nodularity greatest in the right lower lobe, per report chronic and likely from chronic atypical infection. Electronically Signed   By: Monte Fantasia M.D.   On: 07/16/2018 12:48    Micro Results   No results found for this or any previous visit (from the past 240 hour(s)).     Today   Subjective    Fadil Macmaster today has no new complaints,  feeling better, eager to go home, no vomiting or diarrhea,          Patient has been seen  and examined prior to discharge   Objective   Blood pressure (!) 152/100, pulse 72, temperature 98 F (36.7 C), temperature source Oral, resp. rate 18, height 5\' 7"  (1.702 m), weight 113.4 kg, SpO2 92 %.   Intake/Output Summary (Last 24 hours) at 07/17/2018 0845 Last data filed at 07/16/2018 1700 Gross per 24 hour  Intake 240 ml  Output -  Net 240 ml    Exam Physical Examination:  General appearance - alert, obese appearing, No further conversational dyspnea Mouth/Face--patient is a pink puffer, pursing his lips as he breathes Mental status - alert, oriented to person, place, and time,  Nose- [place] 2 L/min Eyes - sclera anicteric Neck - supple, no JVD elevation , Chest -improving air movement, no wheezing heart - S1 and S2 normal,  Abdomen - soft, nontender, nondistended, increased truncal adiposity  Neurological - screening mental status exam normal, neck supple without rigidity, cranial nerves II through XII intact, DTR's normal and symmetric Extremities - no pedal edema noted, intact peripheral pulses  Skin - warm, dry   Data Review   CBC w Diff:  Lab Results  Component Value Date   WBC 15.2 (H) 07/17/2018   HGB 15.3 07/17/2018   HCT 47.2 07/17/2018   PLT 231 07/17/2018   LYMPHOPCT 30 07/16/2018   MONOPCT 9 07/16/2018   EOSPCT 1 07/16/2018   BASOPCT 0 07/16/2018    CMP:  Lab Results  Component Value Date   NA 138 07/17/2018   K 4.3 07/17/2018   CL 102 07/17/2018   CO2 28 07/17/2018   BUN 8 07/17/2018   CREATININE 0.78 07/17/2018   PROT 7.1 07/16/2018   ALBUMIN 3.7 07/16/2018   BILITOT 0.6 07/16/2018   ALKPHOS 71 07/16/2018   AST 15 07/16/2018   ALT 18 07/16/2018   Total Discharge time is about 33 minutes  Roxan Hockey M.D on 07/17/2018 at 8:45 AM  Pager---[phone]  Go to [email or web address] - password [email or web address] for contact info  [facility] - Office   [phone]

## 2018-07-17 NOTE — Progress Notes (Signed)
Patient discharged home with personal belongings. IV removed and site intact. Patient discharged with prescriptions and verbalizes understanding.

## 2018-07-17 NOTE — Discharge Instructions (Signed)
1)Smoking Cessation strongly advised 2) take doxycycline antibiotic and prednisone as prescribed 3) follow-up with [facility] oncology/pulmonary team as scheduled previously on 07/23/2018 4) have your primary care doctor refer you for an outpatient sleep study as you probably have sleep apnea and may need CPAP 5) use oxygen at all times as advised

## 2019-03-04 IMAGING — DX DG CHEST 2V
2 series · 2 of 2 positions shown · non-contrast
Comparison: 03/08/2018

CLINICAL DATA: Shortness of breath for 2 weeks. Right-sided chest
pain. History of COPD. History of lung cancer.

EXAM:
CHEST - 2 VIEW

[chest pa]
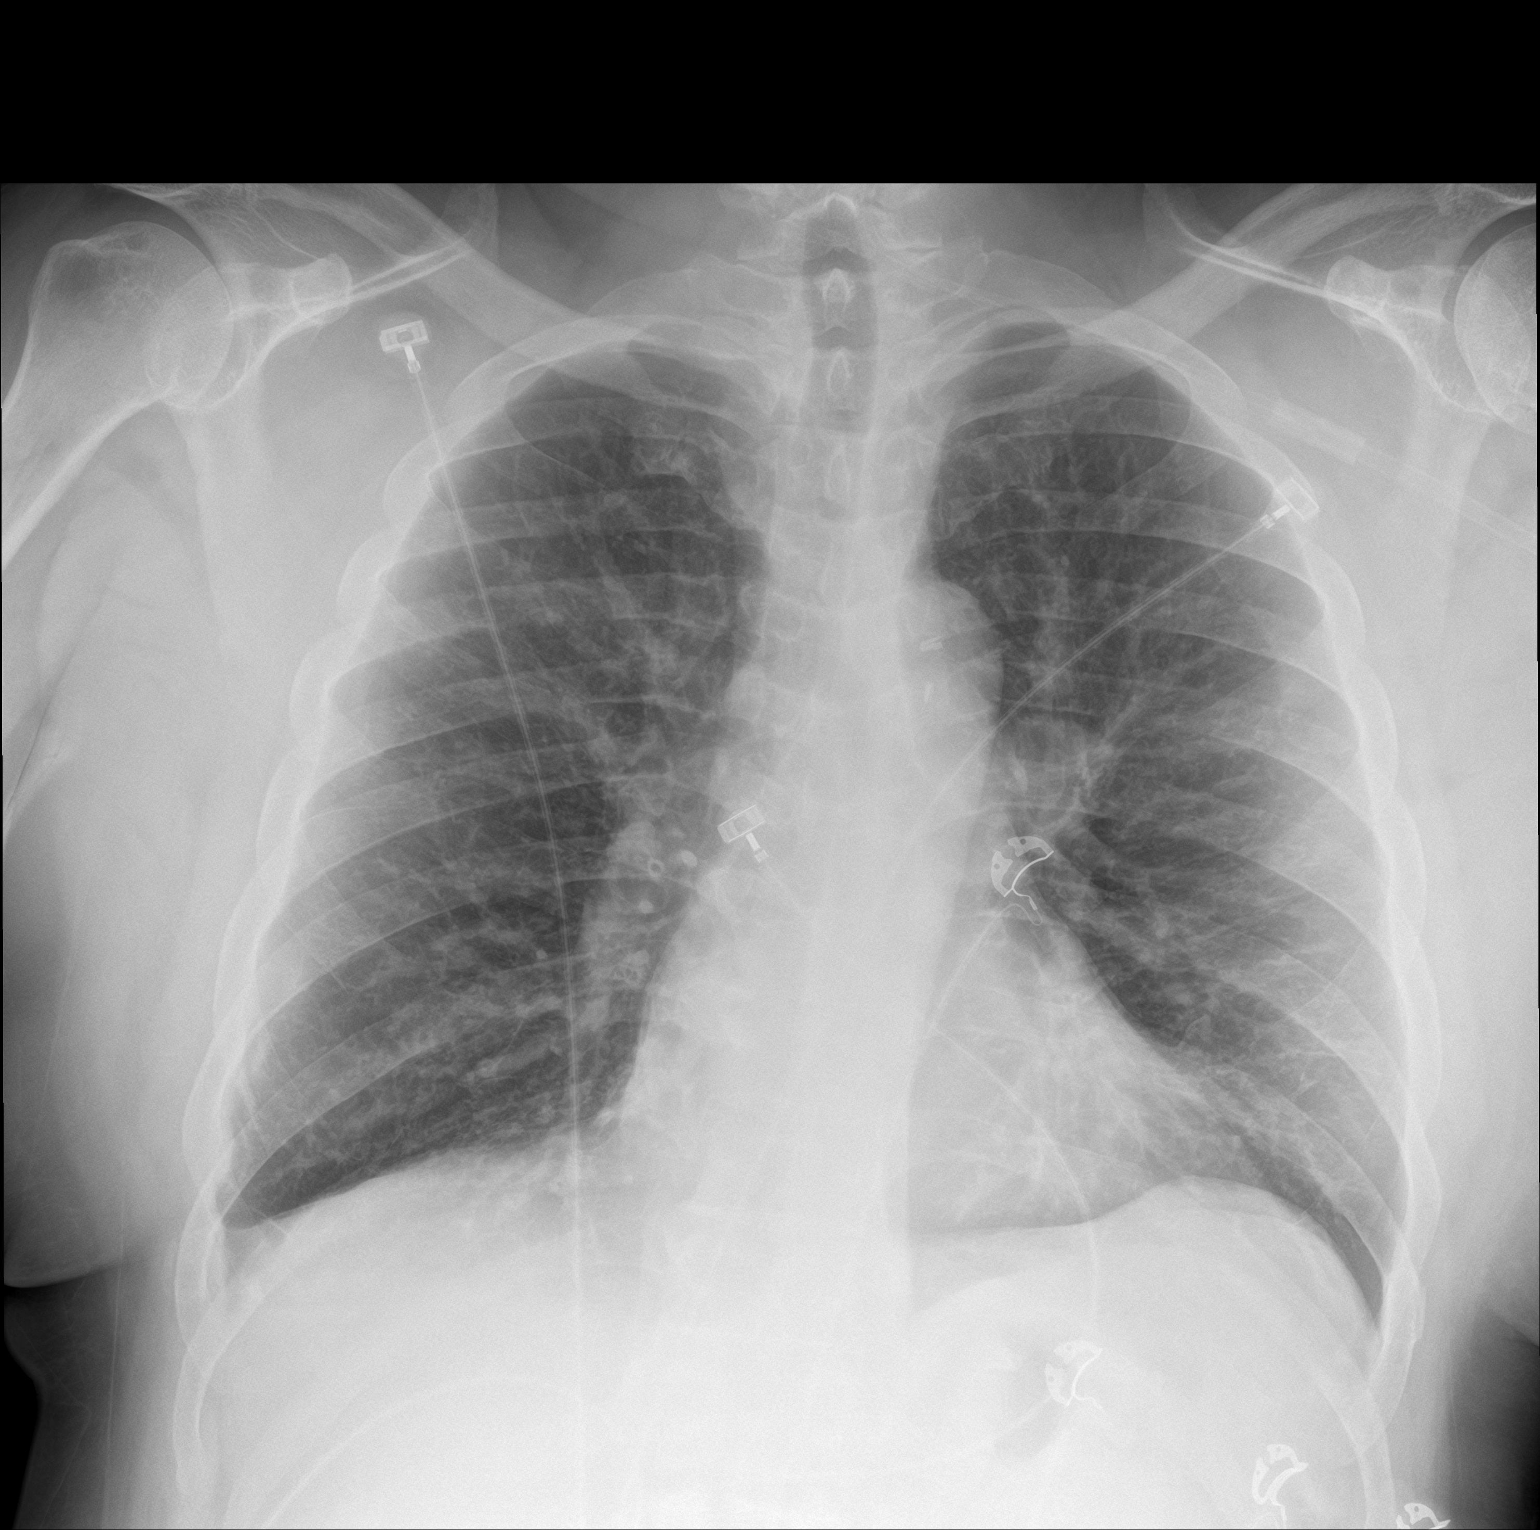

[chest lat]
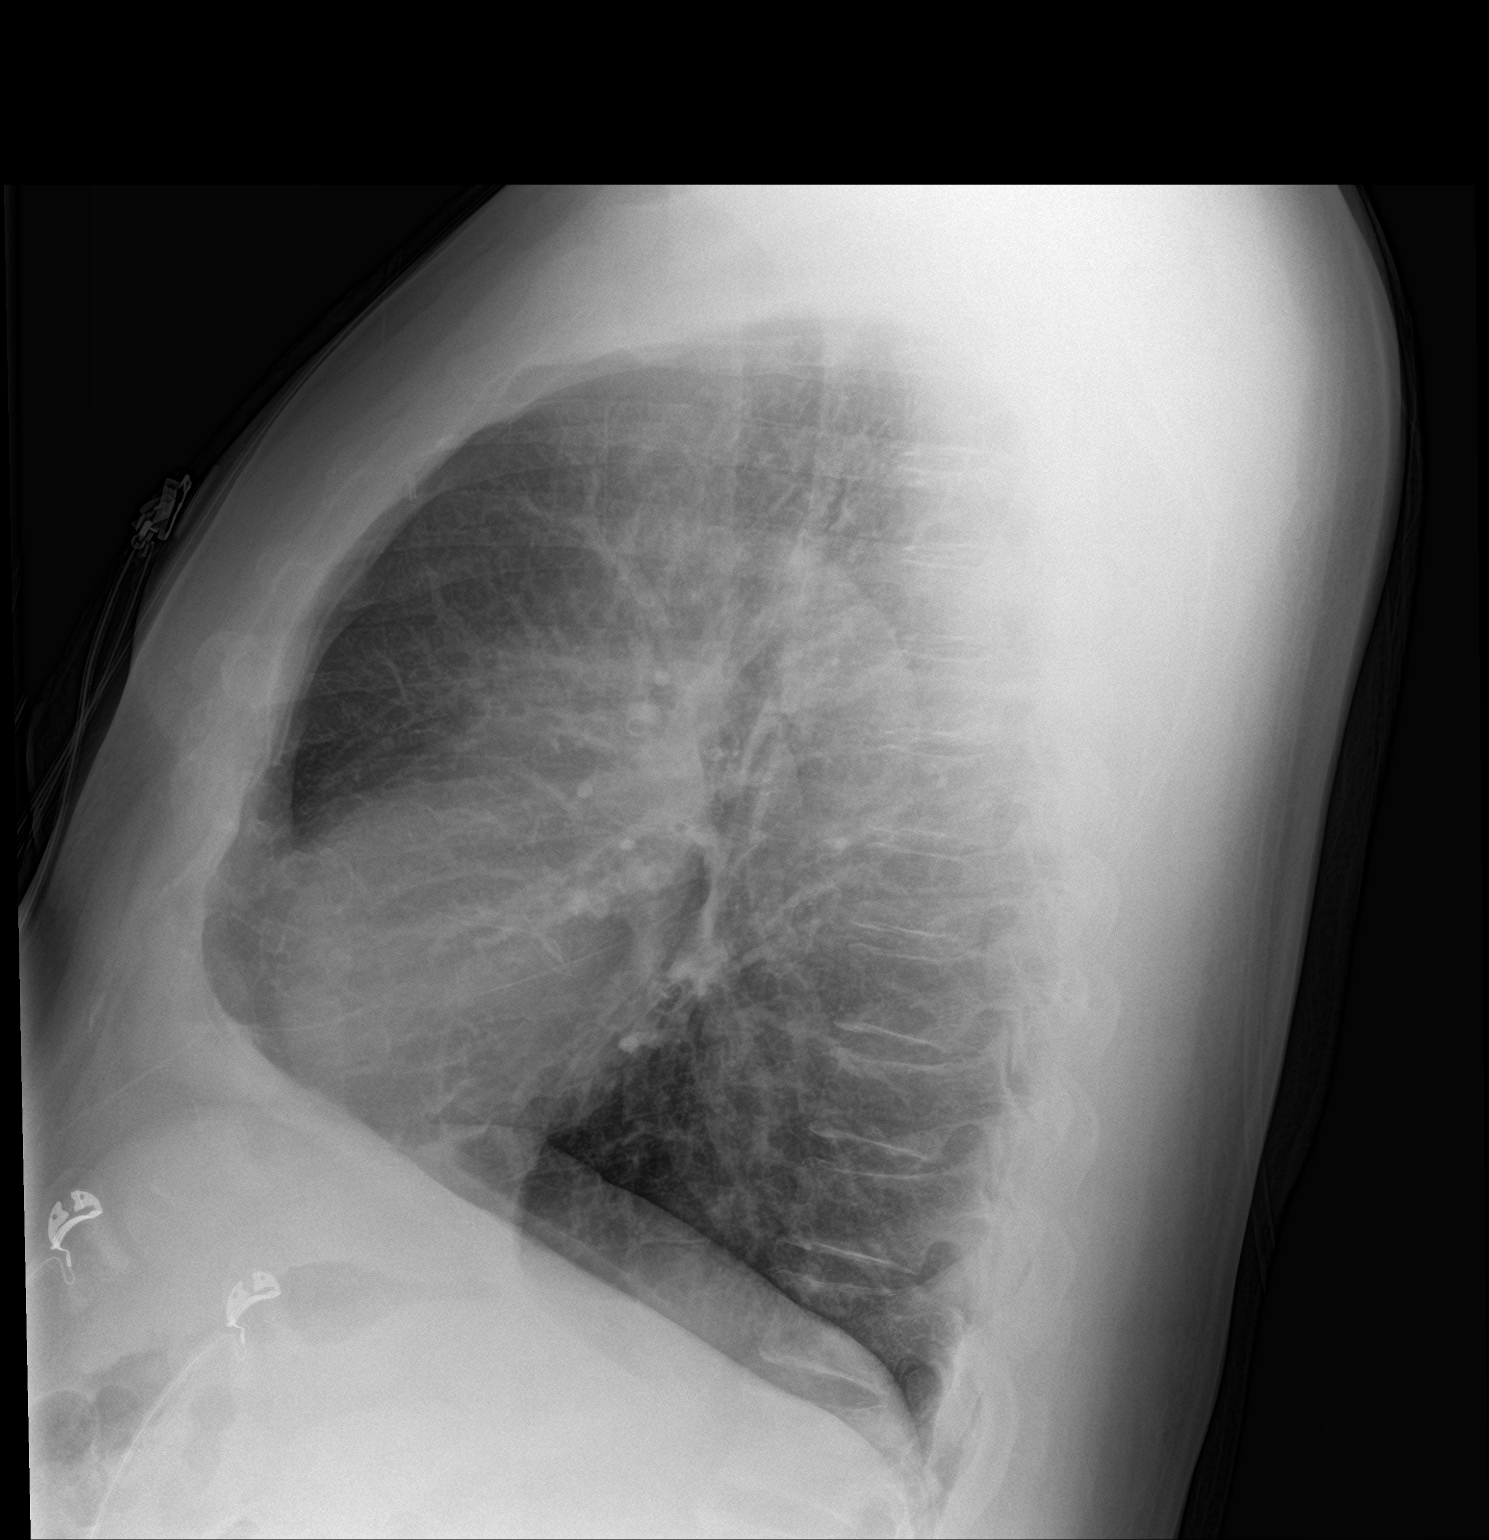

[2 of 2 positions shown; findings below may reference images not displayed]

FINDINGS: The cardiomediastinal silhouette is unchanged and within normal
limits. Peribronchial thickening is similar to the prior study.
There is chronic blunting of the right lateral costophrenic angle
which is unchanged. No sizable pleural effusion, confluent airspace
opacity, overt pulmonary edema, or pneumothorax is identified. An
old left lateral ninth rib fracture is noted.
IMPRESSION: Chronic bronchitic changes.

## 2019-12-11 ENCOUNTER — Emergency Department (HOSPITAL_COMMUNITY): Payer: Medicare HMO

## 2019-12-11 ENCOUNTER — Encounter (HOSPITAL_COMMUNITY): Payer: Self-pay | Admitting: *Deleted

## 2019-12-11 ENCOUNTER — Inpatient Hospital Stay (HOSPITAL_COMMUNITY)
Admission: EM | Admit: 2019-12-11 | Discharge: 2019-12-16 | DRG: 190 | Disposition: A | Discharge status: Home / Self Care | Payer: Medicare HMO | Attending: Internal Medicine | Admitting: Internal Medicine

## 2019-12-11 ENCOUNTER — Other Ambulatory Visit: Payer: Self-pay

## 2019-12-11 DIAGNOSIS — E876 Hypokalemia: Secondary | ICD-10-CM | POA: Diagnosis present

## 2019-12-11 DIAGNOSIS — T380X5A Adverse effect of glucocorticoids and synthetic analogues, initial encounter: Secondary | ICD-10-CM | POA: Diagnosis not present

## 2019-12-11 DIAGNOSIS — K219 Gastro-esophageal reflux disease without esophagitis: Secondary | ICD-10-CM | POA: Diagnosis present

## 2019-12-11 DIAGNOSIS — Z791 Long term (current) use of non-steroidal anti-inflammatories (NSAID): Secondary | ICD-10-CM

## 2019-12-11 DIAGNOSIS — Z79899 Other long term (current) drug therapy: Secondary | ICD-10-CM

## 2019-12-11 DIAGNOSIS — J441 Chronic obstructive pulmonary disease with (acute) exacerbation: Principal | ICD-10-CM

## 2019-12-11 DIAGNOSIS — G893 Neoplasm related pain (acute) (chronic): Secondary | ICD-10-CM | POA: Diagnosis present

## 2019-12-11 DIAGNOSIS — J9621 Acute and chronic respiratory failure with hypoxia: Secondary | ICD-10-CM | POA: Diagnosis present

## 2019-12-11 DIAGNOSIS — R7302 Impaired glucose tolerance (oral): Secondary | ICD-10-CM | POA: Diagnosis present

## 2019-12-11 DIAGNOSIS — Z7952 Long term (current) use of systemic steroids: Secondary | ICD-10-CM

## 2019-12-11 DIAGNOSIS — Y92239 Unspecified place in hospital as the place of occurrence of the external cause: Secondary | ICD-10-CM | POA: Diagnosis not present

## 2019-12-11 DIAGNOSIS — F17210 Nicotine dependence, cigarettes, uncomplicated: Secondary | ICD-10-CM | POA: Diagnosis present

## 2019-12-11 DIAGNOSIS — Z8249 Family history of ischemic heart disease and other diseases of the circulatory system: Secondary | ICD-10-CM

## 2019-12-11 DIAGNOSIS — Z20822 Contact with and (suspected) exposure to covid-19: Secondary | ICD-10-CM | POA: Diagnosis present

## 2019-12-11 DIAGNOSIS — R739 Hyperglycemia, unspecified: Secondary | ICD-10-CM | POA: Diagnosis present

## 2019-12-11 DIAGNOSIS — Z6839 Body mass index (BMI) 39.0-39.9, adult: Secondary | ICD-10-CM

## 2019-12-11 DIAGNOSIS — Z79891 Long term (current) use of opiate analgesic: Secondary | ICD-10-CM

## 2019-12-11 DIAGNOSIS — Z7951 Long term (current) use of inhaled steroids: Secondary | ICD-10-CM

## 2019-12-11 DIAGNOSIS — I10 Essential (primary) hypertension: Secondary | ICD-10-CM | POA: Diagnosis present

## 2019-12-11 DIAGNOSIS — E66813 Obesity, class 3: Secondary | ICD-10-CM | POA: Diagnosis present

## 2019-12-11 DIAGNOSIS — Z85118 Personal history of other malignant neoplasm of bronchus and lung: Secondary | ICD-10-CM

## 2019-12-11 DIAGNOSIS — C3491 Malignant neoplasm of unspecified part of right bronchus or lung: Secondary | ICD-10-CM | POA: Diagnosis present

## 2019-12-11 DIAGNOSIS — F17200 Nicotine dependence, unspecified, uncomplicated: Secondary | ICD-10-CM | POA: Diagnosis present

## 2019-12-11 DIAGNOSIS — J9622 Acute and chronic respiratory failure with hypercapnia: Secondary | ICD-10-CM | POA: Diagnosis present

## 2019-12-11 LAB — CBC WITH DIFFERENTIAL/PLATELET
Abs Immature Granulocytes: 0.02 10*3/uL (ref 0.00–0.07)
Basophils Absolute: 0 10*3/uL (ref 0.0–0.1)
Basophils Relative: 0 %
Eosinophils Absolute: 0 10*3/uL (ref 0.0–0.5)
Eosinophils Relative: 1 %
HCT: 55.8 % — ABNORMAL HIGH (ref 39.0–52.0)
Hemoglobin: 16.3 g/dL (ref 13.0–17.0)
Immature Granulocytes: 0 %
Lymphocytes Relative: 19 %
Lymphs Abs: 1.7 10*3/uL (ref 0.7–4.0)
MCH: 29.2 pg (ref 26.0–34.0)
MCHC: 29.2 g/dL — ABNORMAL LOW (ref 30.0–36.0)
MCV: 100 fL (ref 80.0–100.0)
Monocytes Absolute: 0.9 10*3/uL (ref 0.1–1.0)
Monocytes Relative: 10 %
Neutro Abs: 6 10*3/uL (ref 1.7–7.7)
Neutrophils Relative %: 70 %
Platelets: 201 10*3/uL (ref 150–400)
RBC: 5.58 MIL/uL (ref 4.22–5.81)
RDW: 12.7 % (ref 11.5–15.5)
WBC: 8.7 10*3/uL (ref 4.0–10.5)
nRBC: 0 % (ref 0.0–0.2)

## 2019-12-11 LAB — COMPREHENSIVE METABOLIC PANEL
ALT: 22 U/L (ref 0–44)
AST: 18 U/L (ref 15–41)
Albumin: 3.7 g/dL (ref 3.5–5.0)
Alkaline Phosphatase: 75 U/L (ref 38–126)
Anion gap: 12 (ref 5–15)
BUN: 8 mg/dL (ref 6–20)
CO2: 42 mmol/L — ABNORMAL HIGH (ref 22–32)
Calcium: 8.8 mg/dL — ABNORMAL LOW (ref 8.9–10.3)
Chloride: 87 mmol/L — ABNORMAL LOW (ref 98–111)
Creatinine, Ser: 0.95 mg/dL (ref 0.61–1.24)
GFR calc Af Amer: >60 mL/min (ref >60)
GFR calc non Af Amer: >60 mL/min (ref >60)
Glucose, Bld: 173 mg/dL — ABNORMAL HIGH (ref 70–99)
Potassium: 3.3 mmol/L — ABNORMAL LOW (ref 3.5–5.1)
Sodium: 141 mmol/L (ref 135–145)
Total Bilirubin: 0.5 mg/dL (ref 0.3–1.2)
Total Protein: 7.4 g/dL (ref 6.5–8.1)

## 2019-12-11 LAB — RESPIRATORY PANEL BY RT PCR (FLU A&B, COVID)
Influenza A by PCR: NEGATIVE
Influenza B by PCR: NEGATIVE
SARS Coronavirus 2 by RT PCR: NEGATIVE

## 2019-12-11 LAB — MAGNESIUM: Magnesium: 2 mg/dL (ref 1.7–2.4)

## 2019-12-11 LAB — BRAIN NATRIURETIC PEPTIDE: B Natriuretic Peptide: 32 pg/mL (ref 0.0–100.0)

## 2019-12-11 LAB — POC SARS CORONAVIRUS 2 AG -  ED: SARS Coronavirus 2 Ag: NEGATIVE

## 2019-12-11 LAB — TROPONIN I (HIGH SENSITIVITY)
Troponin I (High Sensitivity): 6 ng/L (ref <18)
Troponin I (High Sensitivity): 10 ng/L (ref <18)

## 2019-12-11 MED ORDER — HYDROCHLOROTHIAZIDE 12.5 MG PO CAPS
12.5000 mg | ORAL_CAPSULE | Freq: Every day | ORAL | Status: DC
  Administered 2019-12-12 – 2019-12-16 (×5): 12.5 mg via ORAL
  Filled 2019-12-11 (×5): qty 1

## 2019-12-11 MED ORDER — ALBUTEROL SULFATE HFA 108 (90 BASE) MCG/ACT IN AERS
4.0000 | INHALATION_SPRAY | Freq: Once | RESPIRATORY_TRACT | Status: AC
  Administered 2019-12-11: 4 via RESPIRATORY_TRACT
  Filled 2019-12-11: qty 6.7

## 2019-12-11 MED ORDER — SODIUM CHLORIDE 0.9 % IV SOLN
500.0000 mg | INTRAVENOUS | Status: DC
  Administered 2019-12-11 – 2019-12-14 (×3): 500 mg via INTRAVENOUS
  Filled 2019-12-11 (×4): qty 500

## 2019-12-11 MED ORDER — ONDANSETRON HCL 4 MG/2ML IJ SOLN: 4.0000 mg | Freq: Four times a day (QID) | INTRAMUSCULAR | Status: DC | PRN

## 2019-12-11 MED ORDER — SODIUM CHLORIDE 0.9% FLUSH: 3.0000 mL | INTRAVENOUS | Status: DC | PRN

## 2019-12-11 MED ORDER — POLYETHYLENE GLYCOL 3350 17 G PO PACK: 17.0000 g | PACK | Freq: Every day | ORAL | Status: DC | PRN

## 2019-12-11 MED ORDER — ONDANSETRON HCL 4 MG PO TABS: 4.0000 mg | ORAL_TABLET | Freq: Four times a day (QID) | ORAL | Status: DC | PRN

## 2019-12-11 MED ORDER — METHYLPREDNISOLONE SODIUM SUCC 125 MG IJ SOLR
60.0000 mg | Freq: Two times a day (BID) | INTRAMUSCULAR | Status: DC
  Administered 2019-12-12: 60 mg via INTRAVENOUS
  Filled 2019-12-11: qty 2

## 2019-12-11 MED ORDER — ACETAMINOPHEN 325 MG PO TABS: 650.0000 mg | ORAL_TABLET | Freq: Four times a day (QID) | ORAL | Status: DC | PRN

## 2019-12-11 MED ORDER — IPRATROPIUM-ALBUTEROL 0.5-2.5 (3) MG/3ML IN SOLN: 3.0000 mL | RESPIRATORY_TRACT | Status: DC | PRN

## 2019-12-11 MED ORDER — ALBUTEROL SULFATE (2.5 MG/3ML) 0.083% IN NEBU
2.5000 mg | INHALATION_SOLUTION | Freq: Once | RESPIRATORY_TRACT | Status: AC
  Administered 2019-12-11: 2.5 mg via RESPIRATORY_TRACT
  Filled 2019-12-11: qty 3

## 2019-12-11 MED ORDER — IPRATROPIUM-ALBUTEROL 0.5-2.5 (3) MG/3ML IN SOLN
3.0000 mL | Freq: Once | RESPIRATORY_TRACT | Status: AC
  Administered 2019-12-11: 3 mL via RESPIRATORY_TRACT
  Filled 2019-12-11: qty 3

## 2019-12-11 MED ORDER — ENOXAPARIN SODIUM 60 MG/0.6ML ~~LOC~~ SOLN
60.0000 mg | SUBCUTANEOUS | Status: DC
  Administered 2019-12-11 – 2019-12-15 (×4): 60 mg via SUBCUTANEOUS
  Filled 2019-12-11 (×5): qty 0.6

## 2019-12-11 MED ORDER — ACETAMINOPHEN 650 MG RE SUPP: 650.0000 mg | Freq: Four times a day (QID) | RECTAL | Status: DC | PRN

## 2019-12-11 MED ORDER — GUAIFENESIN-DM 100-10 MG/5ML PO SYRP
10.0000 mL | ORAL_SOLUTION | Freq: Three times a day (TID) | ORAL | Status: AC
  Administered 2019-12-11 – 2019-12-13 (×5): 10 mL via ORAL
  Filled 2019-12-11 (×5): qty 10

## 2019-12-11 MED ORDER — IPRATROPIUM-ALBUTEROL 0.5-2.5 (3) MG/3ML IN SOLN
3.0000 mL | Freq: Four times a day (QID) | RESPIRATORY_TRACT | Status: DC
  Administered 2019-12-12: 3 mL via RESPIRATORY_TRACT
  Filled 2019-12-11: qty 3

## 2019-12-11 MED ORDER — METHYLPREDNISOLONE SODIUM SUCC 125 MG IJ SOLR
125.0000 mg | Freq: Once | INTRAMUSCULAR | Status: AC
  Administered 2019-12-11: 125 mg via INTRAVENOUS
  Filled 2019-12-11: qty 2

## 2019-12-11 MED ORDER — POTASSIUM CHLORIDE CRYS ER 20 MEQ PO TBCR
40.0000 meq | EXTENDED_RELEASE_TABLET | Freq: Two times a day (BID) | ORAL | Status: AC
  Administered 2019-12-11 – 2019-12-12 (×2): 40 meq via ORAL
  Filled 2019-12-11 (×2): qty 2

## 2019-12-11 NOTE — ED Notes (Signed)
Floor rn to call back to rec report

## 2019-12-11 NOTE — ED Provider Notes (Signed)
[facility] EMERGENCY DEPARTMENT Provider Note   CSN: [id number] Arrival date & time: 12/11/19  1543     History Chief Complaint  Patient presents with  . Shortness of Breath    Miguel Marsh is a 59 y.o. male.  Patient complains of shortness of breath.  Patient has a history of COPD and is usually using 2 L nasal but he is continued to be short of breath with that  The history is provided by the patient. No language interpreter was used.  Shortness of Breath Severity:  Moderate Onset quality:  Sudden Timing:  Constant Progression:  Worsening Chronicity:  New Context: not activity   Relieved by:  Nothing Worsened by:  Nothing Ineffective treatments:  None tried Associated symptoms: no abdominal pain, no chest pain, no cough, no headaches and no rash        Past Medical History:  Diagnosis Date  . Arthritis   . Bronchitis   . Cancer [facility])    metastatic NSCLC (followed by [facility])  . COPD (chronic obstructive pulmonary disease) ([place])   . HTN (hypertension)     Patient Active Problem List   Diagnosis Date Noted  . Acute respiratory failure ([place]) 01/23/2018  . Acute on chronic respiratory failure with hypoxia and hypercapnia ([place]) 01/23/2018  . Acute on chronic respiratory failure with hypoxia ([place]) 01/01/2018  . Obesity, Class III, BMI 40-49.9 (morbid obesity) ([place]) 01/01/2018  . COPD with exacerbation ([place]) 01/01/2018  . Essential hypertension 12/09/2015  . Non-small cell cancer of right lung ([place]) 12/09/2015  . GERD (gastroesophageal reflux disease) 12/09/2015  . COPD with acute exacerbation ([place]) 12/17/2014  . Acute on chronic respiratory failure ([place]) 12/17/2014  . Tobacco use disorder 12/17/2014  . COPD exacerbation ([place]) 12/17/2014  . Obesity 12/17/2014  . Dyspnea   . Difficulty in walking(719.7) 04/02/2013  . Hip weakness 04/02/2013    Past Surgical History:  Procedure Laterality Date  . LUNG BIOPSY    . TOTAL HIP ARTHROPLASTY    . TUMOR REMOVAL  Right 12/04/2017       History reviewed. No pertinent family history.  Social History   Tobacco Use  . Smoking status: Former Smoker    Packs/day: 0.10    Types: Cigarettes  . Smokeless tobacco: Never Used  Substance Use Topics  . Alcohol use: Yes    Comment: occ  . Drug use: No    Types: Marijuana, Cocaine    Comment: former    Home Medications Prior to Admission medications   Medication Sig Start Date End Date Taking? Authorizing Provider  acetaminophen (TYLENOL) 500 MG tablet Take 500 mg by mouth every 6 (six) hours as needed for mild pain.    [provider]  albuterol (PROVENTIL HFA;VENTOLIN HFA) 108 (90 Base) MCG/ACT inhaler Inhale 2 puffs into the lungs every 6 (six) hours as needed for wheezing. 07/17/18   Roxan Hockey, MD  albuterol (PROVENTIL) (2.5 MG/3ML) 0.083% nebulizer solution Take 3 mLs (2.5 mg total) by nebulization every 4 (four) hours as needed for wheezing or shortness of breath. 07/17/18   Roxan Hockey, MD  albuterol (PROVENTIL) (2.5 MG/3ML) 0.083% nebulizer solution SMARTSIG:1 Vial(s) Via Nebulizer Every 6 Hours 07/10/19   [provider]  guaiFENesin (MUCINEX) 600 MG 12 hr tablet Take 1 tablet (600 mg total) by mouth 2 (two) times daily. 07/17/18   Roxan Hockey, MD  hydrochlorothiazide (HYDRODIURIL) 12.5 MG tablet Take 1 tablet (12.5 mg total) by mouth daily. 07/17/18   Roxan Hockey, MD  ibuprofen (ADVIL) 800 MG tablet  12/01/19   [provider]  nicotine (NICODERM CQ - DOSED IN MG/24 HR) 7 mg/24hr patch Place 1 patch (7 mg total) onto the skin daily. 07/17/18   Roxan Hockey, MD  nicotine polacrilex (NICORETTE) 2 MG gum Take 1 each (2 mg total) by mouth as needed for smoking cessation. 07/17/18   Roxan Hockey, MD  oxyCODONE-acetaminophen (PERCOCET) 10-325 MG tablet Take 1 tablet by mouth every 6 (six) hours as needed. 12/01/19   [provider]  predniSONE (DELTASONE) 50 MG tablet Take 1 tablet (50 mg total)  by mouth daily with breakfast. 07/17/18   Emokpae, Courage, MD  senna-docusate (SENOKOT-S) 8.6-50 MG tablet Take 2 tablets by mouth at bedtime. 07/17/18   Roxan Hockey, MD  SYMBICORT 80-4.5 MCG/ACT inhaler Inhale 2 puffs into the lungs 2 (two) times daily. 07/17/18   Roxan Hockey, MD  tiotropium (SPIRIVA) 18 MCG inhalation capsule Place 1 capsule (18 mcg total) into inhaler and inhale daily. 07/17/18   Emokpae, Courage, MD  TRELEGY ELLIPTA 100-62.5-25 MCG/INH AEPB Take 1 puff by mouth daily. 10/27/19   [provider]    Allergies    Patient has no known allergies.  Review of Systems   Review of Systems  Constitutional: Negative for appetite change and fatigue.  HENT: Negative for congestion, ear discharge and sinus pressure.   Eyes: Negative for discharge.  Respiratory: Positive for shortness of breath. Negative for cough.   Cardiovascular: Negative for chest pain.  Gastrointestinal: Negative for abdominal pain and diarrhea.  Genitourinary: Negative for frequency and hematuria.  Musculoskeletal: Negative for back pain.  Skin: Negative for rash.  Neurological: Negative for seizures and headaches.  Psychiatric/Behavioral: Negative for hallucinations.    Physical Exam Updated Vital Signs BP (!) 164/94   Pulse 86   Temp 98.2 F (36.8 C) (Oral)   Resp 18   Ht 5\' 6"  (1.676 m)   Wt 111.1 kg   SpO2 100%   BMI 39.54 kg/m   Physical Exam Vitals reviewed.  Constitutional:      Appearance: He is well-developed.  HENT:     Head: Normocephalic.     Nose: Nose normal.  Eyes:     General: No scleral icterus.    Conjunctiva/sclera: Conjunctivae normal.  Neck:     Thyroid: No thyromegaly.  Cardiovascular:     Rate and Rhythm: Normal rate and regular rhythm.     Heart sounds: No murmur. No friction rub. No gallop.   Pulmonary:     Breath sounds: No stridor. Wheezing present. No rales.  Chest:     Chest wall: No tenderness.  Abdominal:     General: There is no  distension.     Tenderness: There is no abdominal tenderness. There is no rebound.  Musculoskeletal:        General: Normal range of motion.     Cervical back: Neck supple.  Lymphadenopathy:     Cervical: No cervical adenopathy.  Skin:    Findings: No erythema or rash.  Neurological:     Mental Status: He is oriented to person, place, and time.     Motor: No abnormal muscle tone.     Coordination: Coordination normal.  Psychiatric:        Behavior: Behavior normal.     ED Results / Procedures / Treatments   Labs (all labs ordered are listed, but only abnormal results are displayed) Labs Reviewed  CBC WITH DIFFERENTIAL/PLATELET - Abnormal; Notable for the following  components:      Result Value   HCT 55.8 (*)    MCHC 29.2 (*)    All other components within normal limits  COMPREHENSIVE METABOLIC PANEL - Abnormal; Notable for the following components:   Potassium 3.3 (*)    Chloride 87 (*)    CO2 42 (*)    Glucose, Bld 173 (*)    Calcium 8.8 (*)    All other components within normal limits  RESPIRATORY PANEL BY RT PCR (FLU A&B, COVID)  BRAIN NATRIURETIC PEPTIDE  POC SARS CORONAVIRUS 2 AG -  ED  TROPONIN I (HIGH SENSITIVITY)  TROPONIN I (HIGH SENSITIVITY)    EKG None  Radiology DG Chest Portable 1 View  Result Date: 12/11/2019 CLINICAL DATA:  Shortness of breath. EXAM: PORTABLE CHEST 1 VIEW COMPARISON:  July 16, 2018. FINDINGS: The heart size and mediastinal contours are within normal limits. Lungs are clear. No pneumothorax or pleural effusion is noted. The visualized skeletal structures are unremarkable. IMPRESSION: No active disease. Electronically Signed   By: Marijo Conception M.D.   On: 12/11/2019 16:53    Procedures Procedures (including critical care time)  Medications Ordered in ED Medications  methylPREDNISolone sodium succinate (SOLU-MEDROL) 125 mg/2 mL injection 125 mg (125 mg Intravenous Given 12/11/19 1627)  albuterol (VENTOLIN HFA) 108 (90 Base)  MCG/ACT inhaler 4 puff (4 puffs Inhalation Given 12/11/19 1628)  ipratropium-albuterol (DUONEB) 0.5-2.5 (3) MG/3ML nebulizer solution 3 mL (3 mLs Nebulization Given 12/11/19 1936)  albuterol (PROVENTIL) (2.5 MG/3ML) 0.083% nebulizer solution 2.5 mg (2.5 mg Nebulization Given 12/11/19 1936)    ED Course  I have reviewed the triage vital signs and the nursing notes.  Pertinent labs & imaging results that were available during my care of the patient were reviewed by me and considered in my medical decision making (see chart for details).    MDM Rules/Calculators/A&P                      Patient with exacerbation of COPD.  His O2 sats are still below 90 on his normal 2 L.  He will be admitted to medicine for exacerbation of COPD treatment Final Clinical Impression(s) / ED Diagnoses Final diagnoses:  COPD exacerbation [facility])    Rx / DC Orders ED Discharge Orders    None       Milton Ferguson, MD 12/11/19 2124

## 2019-12-11 NOTE — H&P (Signed)
History and Physical    Miguel Marsh PPI:[id number] DOB: [birth year] DOA: 12/11/2019  PCP: Scotty Court, DO   Patient coming from: home  I have personally briefly reviewed patient's old medical records in [place]  Chief Complaint: Shortness of breath, wheezing  HPI: Miguel Marsh is a 59 y.o. male with medical history significant for COPD with chronic respiratory failure, hypertension, lung cancer. Patient presented to the ED with complaints of difficulty breathing for 3 days.  He is on 2 L O2 at baseline.  Reports a chronic mild admittedly productive but unchanged cough.  He reports wheezing also. No chest pain, no leg swelling redness or pain.  No history of blood clots in lungs or legs.  EMS reports O2 sats 44%-unsure if this was on room air.  O2 sats improved to 99% on 4 L.  ED Course: O2 sats 88% on 2 L, improved with 3 L nasal cannula to greater than 95%.  WBC 8.7.  Hs troponin 6> 10.  BNP 32.  POC Covid test and respiratory panel negative for Covid and influenza.  Portable chest x-ray negative for acute abnormality.  Solu-Medrol and duo nebs given in ED.  Hospitalist admit for COPD exacerbation.  Review of Systems: As per HPI all other systems reviewed and negative.  Past Medical History:  Diagnosis Date  . Arthritis   . Bronchitis   . Cancer [facility])    metastatic NSCLC (followed by [facility])  . COPD (chronic obstructive pulmonary disease) ([place])   . HTN (hypertension)     Past Surgical History:  Procedure Laterality Date  . LUNG BIOPSY    . TOTAL HIP ARTHROPLASTY    . TUMOR REMOVAL Right 12/04/2017     reports that he has quit smoking. His smoking use included cigarettes. He smoked 0.10 packs per day. He has never used smokeless tobacco. He reports current alcohol use. He reports that he does not use drugs.  No Known Allergies  Family history of hypertension.  Prior to Admission medications   Medication Sig Start Date End Date Taking? Authorizing  Provider  acetaminophen (TYLENOL) 500 MG tablet Take 500 mg by mouth every 6 (six) hours as needed for mild pain.    [provider]  albuterol (PROVENTIL HFA;VENTOLIN HFA) 108 (90 Base) MCG/ACT inhaler Inhale 2 puffs into the lungs every 6 (six) hours as needed for wheezing. 07/17/18   Roxan Hockey, MD  albuterol (PROVENTIL) (2.5 MG/3ML) 0.083% nebulizer solution Take 3 mLs (2.5 mg total) by nebulization every 4 (four) hours as needed for wheezing or shortness of breath. 07/17/18   Roxan Hockey, MD  albuterol (PROVENTIL) (2.5 MG/3ML) 0.083% nebulizer solution SMARTSIG:1 Vial(s) Via Nebulizer Every 6 Hours 07/10/19   [provider]  guaiFENesin (MUCINEX) 600 MG 12 hr tablet Take 1 tablet (600 mg total) by mouth 2 (two) times daily. 07/17/18   Roxan Hockey, MD  hydrochlorothiazide (HYDRODIURIL) 12.5 MG tablet Take 1 tablet (12.5 mg total) by mouth daily. 07/17/18   Roxan Hockey, MD  ibuprofen (ADVIL) 800 MG tablet  12/01/19   [provider]  nicotine (NICODERM CQ - DOSED IN MG/24 HR) 7 mg/24hr patch Place 1 patch (7 mg total) onto the skin daily. 07/17/18   Roxan Hockey, MD  nicotine polacrilex (NICORETTE) 2 MG gum Take 1 each (2 mg total) by mouth as needed for smoking cessation. 07/17/18   Roxan Hockey, MD  oxyCODONE-acetaminophen (PERCOCET) 10-325 MG tablet Take 1 tablet by mouth every 6 (six) hours  as needed. 12/01/19   [provider]  predniSONE (DELTASONE) 50 MG tablet Take 1 tablet (50 mg total) by mouth daily with breakfast. 07/17/18   Ekansh Sherk, Courage, MD  senna-docusate (SENOKOT-S) 8.6-50 MG tablet Take 2 tablets by mouth at bedtime. 07/17/18   Roxan Hockey, MD  SYMBICORT 80-4.5 MCG/ACT inhaler Inhale 2 puffs into the lungs 2 (two) times daily. 07/17/18   Roxan Hockey, MD  tiotropium (SPIRIVA) 18 MCG inhalation capsule Place 1 capsule (18 mcg total) into inhaler and inhale daily. 07/17/18   Katalaya Beel, Courage, MD  TRELEGY ELLIPTA  100-62.5-25 MCG/INH AEPB Take 1 puff by mouth daily. 10/27/19   [provider]    Physical Exam: Vitals:   12/11/19 1945 12/11/19 2100 12/11/19 2107 12/11/19 2114  BP:  (!) 164/94    Pulse: 88 85  86  Resp: (!) 26 20  18   Temp:      TempSrc:      SpO2: 96% 90% 96% 100%  Weight:      Height:        Constitutional: Mild increased work of breathing, Vitals:   12/11/19 1945 12/11/19 2100 12/11/19 2107 12/11/19 2114  BP:  (!) 164/94    Pulse: 88 85  86  Resp: (!) 26 20  18   Temp:      TempSrc:      SpO2: 96% 90% 96% 100%  Weight:      Height:       Eyes: PERRL, lids and conjunctivae normal ENMT: Mucous membranes are moist. Posterior pharynx clear of any exudate or lesions.  Neck: normal, supple, no masses, no thyromegaly Respiratory: Marked decreased air entry bilaterally, with faint expiratory wheezes, mild increased respiratory effort was when making long sentences, no accessory muscle use.  Cardiovascular: Regular rate and rhythm, No extremity edema. 2+ pedal pulses. Abdomen: no tenderness, no masses palpated. No hepatosplenomegaly. Bowel sounds positive.  Musculoskeletal: no clubbing / cyanosis. No joint deformity upper and lower extremities. Good ROM, no contractures. Normal muscle tone.  Skin: no rashes, lesions, ulcers. No induration Neurologic: CN 2-12 grossly intact.  Strength 5/5 in all 4.  Psychiatric: Normal judgment and insight. Alert and oriented x 3. Normal mood.   Labs on Admission: I have personally reviewed following labs and imaging studies  CBC: Recent Labs  Lab 12/11/19 1613  WBC 8.7  NEUTROABS 6.0  HGB 16.3  HCT 55.8*  MCV 100.0  PLT 416   Basic Metabolic Panel: Recent Labs  Lab 12/11/19 1613  NA 141  K 3.3*  CL 87*  CO2 42*  GLUCOSE 173*  BUN 8  CREATININE 0.95  CALCIUM 8.8*   Liver Function Tests: Recent Labs  Lab 12/11/19 1613  AST 18  ALT 22  ALKPHOS 75  BILITOT 0.5  PROT 7.4  ALBUMIN 3.7    Radiological Exams  on Admission: DG Chest Portable 1 View  Result Date: 12/11/2019 CLINICAL DATA:  Shortness of breath. EXAM: PORTABLE CHEST 1 VIEW COMPARISON:  July 16, 2018. FINDINGS: The heart size and mediastinal contours are within normal limits. Lungs are clear. No pneumothorax or pleural effusion is noted. The visualized skeletal structures are unremarkable. IMPRESSION: No active disease. Electronically Signed   By: Marijo Conception M.D.   On: 12/11/2019 16:53   EKG: None.   Assessment/Plan Active Problems:   COPD exacerbation (HCC)    COPD exacerbation with acute on chronic respiratory failure-wheezing Marked reduced air entry bilaterally, WBC 8.7.  O2 sats here 88% on 2 L,  currently on 3 L O2 sats greater than 95%.  Portable chest x-ray negative for acute abnormality.  Respiratory virus panel negative for COVID-19 and influenza. -125 mg Solu-Medrol given, continue 60 q. 12 hourly -Duo nebs scheduled and as needed -Flutter valve -Mucolytic's, supplemental O2 -Azithromycin 500 mg daily -Obtain EKG  Hypokalemia- 3.3. - Replete -Check magnesium  Hypertension-systolic 948A to 165V. -Resume home HCTZ for now pending med reconciliation  Non-small cell lung cancer-diagnosed 2016. S/p immunotherapy 2018.  Follows with [facility]ology at [facility].   DVT prophylaxis: Lovenox Code Status: Full code Family Communication: None at bedside Disposition Plan: 1 to 2 days Consults called: None Admission status: Observation, telemetry   Bethena Roys MD [facility]  12/11/2019, 10:11 PM

## 2019-12-11 NOTE — ED Triage Notes (Signed)
Patient presents to the ED via [facility] due to shortness of breath, with 02 sats at 44%.  Patient has history of COPD, and is on home 02 2LPM.  Patient given 4 LPM by EMS, reports 02 sats back to 99%.

## 2019-12-12 ENCOUNTER — Inpatient Hospital Stay (HOSPITAL_COMMUNITY)
Admit: 2019-12-12 | Discharge: 2019-12-12 | Disposition: A | Discharge status: Home / Self Care | Payer: Medicare HMO | Attending: Internal Medicine | Admitting: Internal Medicine

## 2019-12-12 DIAGNOSIS — J9622 Acute and chronic respiratory failure with hypercapnia: Secondary | ICD-10-CM | POA: Diagnosis present

## 2019-12-12 DIAGNOSIS — Z7951 Long term (current) use of inhaled steroids: Secondary | ICD-10-CM | POA: Diagnosis not present

## 2019-12-12 DIAGNOSIS — J9621 Acute and chronic respiratory failure with hypoxia: Secondary | ICD-10-CM | POA: Diagnosis present

## 2019-12-12 DIAGNOSIS — R739 Hyperglycemia, unspecified: Secondary | ICD-10-CM | POA: Diagnosis present

## 2019-12-12 DIAGNOSIS — R7302 Impaired glucose tolerance (oral): Secondary | ICD-10-CM | POA: Diagnosis present

## 2019-12-12 DIAGNOSIS — Z8249 Family history of ischemic heart disease and other diseases of the circulatory system: Secondary | ICD-10-CM | POA: Diagnosis not present

## 2019-12-12 DIAGNOSIS — Z7952 Long term (current) use of systemic steroids: Secondary | ICD-10-CM | POA: Diagnosis not present

## 2019-12-12 DIAGNOSIS — I10 Essential (primary) hypertension: Secondary | ICD-10-CM

## 2019-12-12 DIAGNOSIS — G893 Neoplasm related pain (acute) (chronic): Secondary | ICD-10-CM | POA: Diagnosis present

## 2019-12-12 DIAGNOSIS — Z85118 Personal history of other malignant neoplasm of bronchus and lung: Secondary | ICD-10-CM | POA: Diagnosis not present

## 2019-12-12 DIAGNOSIS — Y92239 Unspecified place in hospital as the place of occurrence of the external cause: Secondary | ICD-10-CM | POA: Diagnosis not present

## 2019-12-12 DIAGNOSIS — Z20822 Contact with and (suspected) exposure to covid-19: Secondary | ICD-10-CM | POA: Diagnosis present

## 2019-12-12 DIAGNOSIS — Z6839 Body mass index (BMI) 39.0-39.9, adult: Secondary | ICD-10-CM | POA: Diagnosis not present

## 2019-12-12 DIAGNOSIS — C3491 Malignant neoplasm of unspecified part of right bronchus or lung: Secondary | ICD-10-CM

## 2019-12-12 DIAGNOSIS — K219 Gastro-esophageal reflux disease without esophagitis: Secondary | ICD-10-CM | POA: Diagnosis present

## 2019-12-12 DIAGNOSIS — F17200 Nicotine dependence, unspecified, uncomplicated: Secondary | ICD-10-CM

## 2019-12-12 DIAGNOSIS — R4182 Altered mental status, unspecified: Secondary | ICD-10-CM

## 2019-12-12 DIAGNOSIS — E876 Hypokalemia: Secondary | ICD-10-CM | POA: Diagnosis present

## 2019-12-12 DIAGNOSIS — T380X5A Adverse effect of glucocorticoids and synthetic analogues, initial encounter: Secondary | ICD-10-CM | POA: Diagnosis not present

## 2019-12-12 DIAGNOSIS — Z79891 Long term (current) use of opiate analgesic: Secondary | ICD-10-CM | POA: Diagnosis not present

## 2019-12-12 DIAGNOSIS — Z79899 Other long term (current) drug therapy: Secondary | ICD-10-CM | POA: Diagnosis not present

## 2019-12-12 DIAGNOSIS — F17210 Nicotine dependence, cigarettes, uncomplicated: Secondary | ICD-10-CM | POA: Diagnosis present

## 2019-12-12 DIAGNOSIS — Z791 Long term (current) use of non-steroidal anti-inflammatories (NSAID): Secondary | ICD-10-CM | POA: Diagnosis not present

## 2019-12-12 DIAGNOSIS — J441 Chronic obstructive pulmonary disease with (acute) exacerbation: Secondary | ICD-10-CM | POA: Diagnosis present

## 2019-12-12 LAB — BASIC METABOLIC PANEL
Anion gap: 9 (ref 5–15)
BUN: 12 mg/dL (ref 6–20)
CO2: 43 mmol/L — ABNORMAL HIGH (ref 22–32)
Calcium: 9 mg/dL (ref 8.9–10.3)
Chloride: 91 mmol/L — ABNORMAL LOW (ref 98–111)
Creatinine, Ser: 0.84 mg/dL (ref 0.61–1.24)
GFR calc Af Amer: >60 mL/min (ref >60)
GFR calc non Af Amer: >60 mL/min (ref >60)
Glucose, Bld: 103 mg/dL — ABNORMAL HIGH (ref 70–99)
Potassium: 4.7 mmol/L (ref 3.5–5.1)
Sodium: 143 mmol/L (ref 135–145)

## 2019-12-12 LAB — HIV ANTIBODY (ROUTINE TESTING W REFLEX): HIV Screen 4th Generation wRfx: NONREACTIVE

## 2019-12-12 MED ORDER — OXYCODONE-ACETAMINOPHEN 7.5-325 MG PO TABS
1.0000 | ORAL_TABLET | Freq: Four times a day (QID) | ORAL | Status: DC | PRN
  Filled 2019-12-12: qty 1

## 2019-12-12 MED ORDER — METHYLPREDNISOLONE SODIUM SUCC 125 MG IJ SOLR
60.0000 mg | Freq: Four times a day (QID) | INTRAMUSCULAR | Status: AC
  Administered 2019-12-12 – 2019-12-14 (×10): 60 mg via INTRAVENOUS
  Filled 2019-12-12 (×10): qty 2

## 2019-12-12 MED ORDER — OXYCODONE-ACETAMINOPHEN 5-325 MG PO TABS
1.0000 | ORAL_TABLET | Freq: Four times a day (QID) | ORAL | Status: DC | PRN
  Administered 2019-12-12 – 2019-12-14 (×4): 1 via ORAL
  Filled 2019-12-12 (×4): qty 1

## 2019-12-12 MED ORDER — IPRATROPIUM-ALBUTEROL 0.5-2.5 (3) MG/3ML IN SOLN
3.0000 mL | Freq: Four times a day (QID) | RESPIRATORY_TRACT | Status: DC
  Administered 2019-12-12 – 2019-12-14 (×10): 3 mL via RESPIRATORY_TRACT
  Filled 2019-12-12 (×9): qty 3

## 2019-12-12 MED ORDER — OXYCODONE HCL 5 MG PO TABS
5.0000 mg | ORAL_TABLET | Freq: Four times a day (QID) | ORAL | Status: DC | PRN
  Administered 2019-12-13 – 2019-12-15 (×4): 5 mg via ORAL
  Filled 2019-12-12 (×5): qty 1

## 2019-12-12 MED ORDER — BUDESONIDE 0.5 MG/2ML IN SUSP
0.5000 mg | Freq: Two times a day (BID) | RESPIRATORY_TRACT | Status: DC
  Administered 2019-12-12 – 2019-12-16 (×9): 0.5 mg via RESPIRATORY_TRACT
  Filled 2019-12-12 (×8): qty 2

## 2019-12-12 NOTE — Progress Notes (Signed)
EEG Completed; Results Pending  

## 2019-12-12 NOTE — Procedures (Signed)
Patient Name: LOGAN BALTIMORE  MRN: [id number]  Epilepsy Attending: Lora Havens  Referring Physician/Provider: Dr Shanon Brow Tat Date: 12/12/2019 Duration: 24.10 mins  Patient history: 59 yo M with metastatic lung cancer who presented with aSOB. EEG to evaluate for seizure.   Level of alertness: awake  AEDs during EEG study: None  Technical aspects: This EEG study was done with scalp electrodes positioned according to the 10-20 International system of electrode placement. Electrical activity was acquired at a sampling rate of 500Hz  and reviewed with a high frequency filter of 70Hz  and a low frequency filter of 1Hz . EEG data were recorded continuously and digitally stored.   DESCRIPTION: The posterior dominant rhythm consists of 9 Hz activity of moderate voltage (25-35 uV) seen predominantly in posterior head regions, symmetric and reactive to eye opening and eye closing.     Drowsiness was characterized by attenuation of the posterior background rhythm. EEG alsPhotic driving was not seen during photic stimulation. Hyperventilation was not performed.  IMPRESSION: This study is within normal limits. No seizures or epileptiform discharges were seen throughout the recording.  Dolores Mcgovern Barbra Sarks

## 2019-12-12 NOTE — Progress Notes (Addendum)
PROGRESS NOTE  DEKKER VERGA ION:[id number] DOB: [birth year] DOA: 12/11/2019 PCP: Scotty Court, DO  Brief History:  59 y.o.malewith medical history ofmetastatic non-small cell lung cancer in remission, hypertension, chronic respiratory failure on 2 L at night, GERD, COPD, tobacco abuse presented with 2 week hx of  day history of worsening shortness of breath, cough, and white sputum production that has worsened over past 2 days. The patient continues to smoke 5 cigarettes/day. He has a nearly 80 pack year history. He denied any fevers, chills, nausea, vomiting, diarrhea, abdominal pain, dysuria, hematuria.  He complains of his usual right lower chest pain. In the emergency department, the patient was afebrile and hemodynamically stable with oxygen saturation 95-98% on 4 L.  Chest x-ray was negative for infiltrates.  The patient was started on bronchodilators and Solu-Medrol for COPD exacerbation.  Assessment/Plan: Acute on chronic respiratory failure with hypoxiaand hypercarbia -Initially on 4 Lnasal cannula -Normallyon 2 L , 24/7 -Wean oxygen for saturation greater than 92%  COPD exacerbation -StartedPulmicort  -continue IV solumedrol -Continue duo nebsduring hospitalization  Malignancy related pain -PMP Aware queried--receives monthly percocet 10/325 #120 from K. Georgina Quint, DNP -EKG--neg for ischemic changes -pt has had chronic Right lower chest, RUQ pain since his surgery 12/04/17 -10/20/19 CT chest--Scattered small pulmonary cysts. Diffuse bronchial wall thickening suggests airway predominant COPD. Fusiform bronchiectasis within both lower lobes, with areas of mucus plugging again noted in the periphery of the right lower lobe. 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, mediastinal mass excision and lymph node dissection with nerve sheath tumor resection 12/04/2017 at [facility] -follow up cardiothoracic at [facility]  Essential hypertension -BP remains controlled -continue HCTZ  Medication inducedHyperglycemia/Impaired glucose tolerance -5/23/19hemoglobin A1c--6.6 -start novolog ISS -elevated CBG due to steroids -recheck A1c  Morbid obesity -BMI 39.54 -Lifestyle modification  Tobacco abuse -cessation discussed       Disposition Plan:   Home in 2-3 days when clinically improved  Family Communication:   Family at bedside  Consultants:  none  Code Status:  FULL   DVT Prophylaxis:   Lovenox   Procedures: As Listed in Progress Note Above  Antibiotics: Azithromycin 2/11>>>      Subjective: Patient remains short of breath.  He has shortness of breath with minimal exertion.  He has nonproductive cough right now.  He denies any hemoptysis.  There is no nausea, vomiting, diarrhea, abdominal pain, dysuria, hematuria.  There is no fevers or chills or headache or neck pain.  Objective: Vitals:   12/12/19 0530 12/12/19 0534 12/12/19 0809 12/12/19 0813  BP: (!) 136/58 132/72    Pulse: 60 94    Resp:      Temp:  98.3 F (36.8 C)    TempSrc:  Oral    SpO2: 92% 95% 91% 98%  Weight:      Height:        Intake/Output Summary (Last 24 hours) at 12/12/2019 1349 Last data filed at 12/12/2019 0300 Gross per 24 hour  Intake 250 ml  Output --  Net 250 ml   Weight change:  Exam:   General:  Pt is alert, follows commands appropriately, not in acute distress  HEENT: No icterus, No thrush, No neck mass, [place]/AT  Cardiovascular: RRR, S1/S2, no rubs, no gallops  Respiratory: Bibasal expiratory  wheeze.  Bilateral rales.  Diminished breath sounds bilateral.  Abdomen: Soft/+BS, non tender, non distended, no guarding  Extremities: No edema, No lymphangitis, No petechiae, No rashes, no synovitis   Data  Reviewed: I have personally reviewed following labs and imaging studies Basic Metabolic Panel: Recent Labs  Lab 12/11/19 1613 12/11/19 1840 12/12/19 0606  NA 141  --  143  K 3.3*  --  4.7  CL 87*  --  91*  CO2 42*  --  43*  GLUCOSE 173*  --  103*  BUN 8  --  12  CREATININE 0.95  --  0.84  CALCIUM 8.8*  --  9.0  MG  --  2.0  --    Liver Function Tests: Recent Labs  Lab 12/11/19 1613  AST 18  ALT 22  ALKPHOS 75  BILITOT 0.5  PROT 7.4  ALBUMIN 3.7   No results for input(s): LIPASE, AMYLASE in the last 168 hours. No results for input(s): AMMONIA in the last 168 hours. Coagulation Profile: No results for input(s): INR, PROTIME in the last 168 hours. CBC: Recent Labs  Lab 12/11/19 1613  WBC 8.7  NEUTROABS 6.0  HGB 16.3  HCT 55.8*  MCV 100.0  PLT 201   Cardiac Enzymes: No results for input(s): CKTOTAL, CKMB, CKMBINDEX, TROPONINI in the last 168 hours. BNP: Invalid input(s): POCBNP CBG: No results for input(s): GLUCAP in the last 168 hours. HbA1C: No results for input(s): HGBA1C in the last 72 hours. Urine analysis:    Component Value Date/Time   COLORURINE YELLOW 01/22/2017 1524   APPEARANCEUR CLOUDY (A) 01/22/2017 1524   LABSPEC 1.021 01/22/2017 1524   PHURINE 7.0 01/22/2017 1524   GLUCOSEU NEGATIVE 01/22/2017 1524   HGBUR NEGATIVE 01/22/2017 1524   BILIRUBINUR NEGATIVE 01/22/2017 1524   KETONESUR NEGATIVE 01/22/2017 1524   PROTEINUR NEGATIVE 01/22/2017 1524   UROBILINOGEN 0.2 10/21/2013 0200   NITRITE NEGATIVE 01/22/2017 1524   LEUKOCYTESUR NEGATIVE 01/22/2017 1524   Sepsis Labs: @LABRCNTIP (procalcitonin:4,lacticidven:4) ) Recent Results (from the past 240 hour(s))  Respiratory Panel by RT PCR (Flu A&B, Covid) - Nasopharyngeal Swab     Status: None   Collection Time: 12/11/19  4:20 PM   Specimen: Nasopharyngeal Swab  Result Value Ref Range Status   SARS Coronavirus 2 by RT PCR NEGATIVE NEGATIVE Final    Comment: (NOTE) SARS-CoV-2 target nucleic  acids are NOT DETECTED. The SARS-CoV-2 RNA is generally detectable in upper respiratoy specimens during the acute phase of infection. The lowest concentration of SARS-CoV-2 viral copies this assay can detect is 131 copies/mL. A negative result does not preclude SARS-Cov-2 infection and should not be used as the sole basis for treatment or other patient management decisions. A negative result may occur with  improper specimen collection/handling, submission of specimen other than nasopharyngeal swab, presence of viral mutation(s) within the areas targeted by this assay, and inadequate number of viral copies (<131 copies/mL). A negative result must be combined with clinical observations, patient history, and epidemiological information. The expected result is Negative. Fact Sheet for Patients:  PinkCheek.be Fact Sheet for Healthcare Providers:  GravelBags.it This test is not yet ap proved or cleared by the [place] FDA and  has been authorized for detection and/or diagnosis of SARS-CoV-2 by FDA under an Emergency Use Authorization (EUA). This EUA will remain  in effect (meaning this test can be used) for the duration of the COVID-19 declaration under Section 564(b)(1) of the Act, 21 U.S.C. section 360bbb-3(b)(1), unless the authorization is terminated  or revoked sooner.    Influenza A by PCR NEGATIVE NEGATIVE Final   Influenza B by PCR NEGATIVE NEGATIVE Final    Comment: (NOTE) The Xpert Xpress SARS-CoV-2/FLU/RSV assay is intended as an aid in  the diagnosis of influenza from Nasopharyngeal swab specimens and  should not be used as a sole basis for treatment. Nasal washings and  aspirates are unacceptable for Xpert Xpress SARS-CoV-2/FLU/RSV  testing. Fact Sheet for Patients: PinkCheek.be Fact Sheet for Healthcare Providers: GravelBags.it This test is not yet  approved or cleared by the [place] FDA and  has been authorized for detection and/or diagnosis of SARS-CoV-2 by  FDA under an Emergency Use Authorization (EUA). This EUA will remain  in effect (meaning this test can be used) for the duration of the  Covid-19 declaration under Section 564(b)(1) of the Act, 21  U.S.C. section 360bbb-3(b)(1), unless the authorization is  terminated or revoked. Performed at [facility], [address] [place], [place] [address]      Scheduled Meds: . budesonide (PULMICORT) nebulizer solution  0.5 mg Nebulization BID  . enoxaparin (LOVENOX) injection  60 mg Subcutaneous Q24H  . guaiFENesin-dextromethorphan  10 mL Oral Q8H  . hydrochlorothiazide  12.5 mg Oral Daily  . ipratropium-albuterol  3 mL Nebulization Q6H  . methylPREDNISolone (SOLU-MEDROL) injection  60 mg Intravenous Q12H   Continuous Infusions: . azithromycin 500 mg (12/11/19 2248)    Procedures/Studies: DG Chest Portable 1 View  Result Date: 12/11/2019 CLINICAL DATA:  Shortness of breath. EXAM: PORTABLE CHEST 1 VIEW COMPARISON:  July 16, 2018. FINDINGS: The heart size and mediastinal contours are within normal limits. Lungs are clear. No pneumothorax or pleural effusion is noted. The visualized skeletal structures are unremarkable. IMPRESSION: No active disease. Electronically Signed   By: Marijo Conception M.D.   On: 12/11/2019 16:53    [name], DO  [facility] Pager [phone]  If 7PM-7AM, please contact night-coverage [email or web address] Password [email or web address] 12/12/2019, 1:49 PM   LOS: 0 days

## 2019-12-13 LAB — BASIC METABOLIC PANEL
Anion gap: 11 (ref 5–15)
BUN: 14 mg/dL (ref 6–20)
CO2: 39 mmol/L — ABNORMAL HIGH (ref 22–32)
Calcium: 9.4 mg/dL (ref 8.9–10.3)
Chloride: 90 mmol/L — ABNORMAL LOW (ref 98–111)
Creatinine, Ser: 0.76 mg/dL (ref 0.61–1.24)
GFR calc Af Amer: >60 mL/min (ref >60)
GFR calc non Af Amer: >60 mL/min (ref >60)
Glucose, Bld: 122 mg/dL — ABNORMAL HIGH (ref 70–99)
Potassium: 4.8 mmol/L (ref 3.5–5.1)
Sodium: 140 mmol/L (ref 135–145)

## 2019-12-13 LAB — MAGNESIUM: Magnesium: 2.1 mg/dL (ref 1.7–2.4)

## 2019-12-13 LAB — HEMOGLOBIN A1C
Hgb A1c MFr Bld: 6.4 % — ABNORMAL HIGH (ref 4.8–5.6)
Mean Plasma Glucose: 136.98 mg/dL

## 2019-12-13 MED ORDER — INSULIN ASPART 100 UNIT/ML ~~LOC~~ SOLN
0.0000 [IU] | Freq: Three times a day (TID) | SUBCUTANEOUS | Status: DC
  Administered 2019-12-14 (×2): 1 [IU] via SUBCUTANEOUS
  Administered 2019-12-14: 3 [IU] via SUBCUTANEOUS
  Administered 2019-12-15: 2 [IU] via SUBCUTANEOUS
  Administered 2019-12-15: 3 [IU] via SUBCUTANEOUS
  Administered 2019-12-15: 2 [IU] via SUBCUTANEOUS
  Administered 2019-12-16: 1 [IU] via SUBCUTANEOUS

## 2019-12-13 MED ORDER — ARFORMOTEROL TARTRATE 15 MCG/2ML IN NEBU
15.0000 ug | INHALATION_SOLUTION | Freq: Two times a day (BID) | RESPIRATORY_TRACT | Status: DC
  Administered 2019-12-13 – 2019-12-16 (×6): 15 ug via RESPIRATORY_TRACT
  Filled 2019-12-13 (×6): qty 2

## 2019-12-13 MED ORDER — ORAL CARE MOUTH RINSE
15.0000 mL | Freq: Two times a day (BID) | OROMUCOSAL | Status: DC
  Administered 2019-12-13 – 2019-12-16 (×8): 15 mL via OROMUCOSAL

## 2019-12-13 NOTE — Progress Notes (Signed)
PROGRESS NOTE  Miguel Marsh OMV:[id number] DOB: 07-26-1961 DOA: 12/11/2019 PCP: Scotty Court, DO  Brief History:  59 y.o.malewith medical history ofmetastatic non-small cell lung cancer in remission, hypertension, chronic respiratory failure on 2 L at night, GERD, COPD, tobacco abuse presented with 2 week hx of  day history of worsening shortness of breath, cough, and white sputum production that has worsened over past 2 days. The patient continues to smoke 5 cigarettes/day. He has a nearly 80 pack year history. He denied any fevers, chills, nausea, vomiting, diarrhea, abdominal pain, dysuria, hematuria.  He complains of his usual right lower chest pain. In the emergency department, the patient was afebrile and hemodynamically stable with oxygen saturation 95-98% on 4 L.  Chest x-ray was negative for infiltrates.  The patient was started on bronchodilators and Solu-Medrol for COPD exacerbation.  Assessment/Plan: Acute on chronic respiratory failure with hypoxiaand hypercarbia -Initially on 4 Lnasal cannula>>3.5L -Normallyon 2 L,24/7 -Wean oxygen for saturation 90-92%  COPD exacerbation -Continue Pulmicort -continue IV solumedrol -Continue duo nebsduring hospitalization -add brovana  Malignancy related pain -PMP Aware queried--receives monthly percocet 10/325 #120 from K. Georgina Quint, DNP -EKG--neg for ischemic changes -pt has had chronic Right lower chest, RUQ pain since his surgery 12/04/17 -10/20/19 CT chest--Scattered small pulmonary cysts. Diffuse bronchial wall thickening suggests airway predominant COPD. Fusiform bronchiectasis within both lower lobes, with areas of mucus plugging again noted in the periphery of the right lower lobe. Scarring in the periphery of the right lower lobe  Non-small cell lung cancer, metastatic -Presently in remission -[facility] -Diagnosed 06/11/2015  -Received Pembrolizumab9/09/2015-07/16/2017  Nerve  sheath tumor -Status post right lobectomy, mediastinal mass excision and lymph node dissection with nerve sheath tumor resection 12/04/2017 at [facility] -follow up cardiothoracic at [facility]  Essential hypertension -BP remains controlled -continue HCTZ  Medication inducedHyperglycemia/Impaired glucose tolerance -2/12/21hemoglobin A1c--6.4 -start novolog ISS -elevated CBG due to steroids  Morbid obesity -BMI 39.54 -Lifestyle modification  Tobacco abuse -cessation discussed       Disposition Plan:   Home in 1-2 days when clinically improved and decreased oxygen demand Family Communication:   Family at bedside  Consultants:  none  Code Status:  FULL   DVT Prophylaxis:  [place] Lovenox   Procedures: As Listed in Progress Note Above  Antibiotics: Azithromycin 2/11>>>      Subjective: Pt is breathing slightly better, but still has dyspnea with exertion.  Denies f/c, cp, n/v/d, abd pain.  Objective: Vitals:   12/13/19 0232 12/13/19 0519 12/13/19 0809 12/13/19 1259  BP:  (!) 144/69  (!) 125/59  Pulse:  82  83  Resp:  20  18  Temp:  98.1 F (36.7 C)  98.1 F (36.7 C)  TempSrc:  Oral  Oral  SpO2: 90% 93% 92% 96%  Weight:      Height:        Intake/Output Summary (Last 24 hours) at 12/13/2019 1742 Last data filed at 12/13/2019 1327 Gross per 24 hour  Intake 1192 ml  Output --  Net 1192 ml   Weight change:  Exam:   General:  Pt is alert, follows commands appropriately, not in acute distress  HEENT: No icterus, No thrush, No neck mass, [place]/AT  Cardiovascular: RRR, S1/S2, no rubs, no gallops  Respiratory: bibasilar rales.  Diminished breath sounds bilateral.  Minimal basilar wheeze  Abdomen: Soft/+BS, non tender, non distended, no guarding  Extremities: No edema, No lymphangitis, No petechiae, No rashes,  no synovitis   Data Reviewed: I have personally reviewed following labs and imaging studies Basic Metabolic Panel: Recent Labs  Lab  12/11/19 1613 12/11/19 1840 12/12/19 0606 12/13/19 0626  NA 141  --  143 140  K 3.3*  --  4.7 4.8  CL 87*  --  91* 90*  CO2 42*  --  43* 39*  GLUCOSE 173*  --  103* 122*  BUN 8  --  12 14  CREATININE 0.95  --  0.84 0.76  CALCIUM 8.8*  --  9.0 9.4  MG  --  2.0  --  2.1   Liver Function Tests: Recent Labs  Lab 12/11/19 1613  AST 18  ALT 22  ALKPHOS 75  BILITOT 0.5  PROT 7.4  ALBUMIN 3.7   No results for input(s): LIPASE, AMYLASE in the last 168 hours. No results for input(s): AMMONIA in the last 168 hours. Coagulation Profile: No results for input(s): INR, PROTIME in the last 168 hours. CBC: Recent Labs  Lab 12/11/19 1613  WBC 8.7  NEUTROABS 6.0  HGB 16.3  HCT 55.8*  MCV 100.0  PLT 201   Cardiac Enzymes: No results for input(s): CKTOTAL, CKMB, CKMBINDEX, TROPONINI in the last 168 hours. BNP: Invalid input(s): POCBNP CBG: No results for input(s): GLUCAP in the last 168 hours. HbA1C: Recent Labs    12/13/19 0626  HGBA1C 6.4*   Urine analysis:    Component Value Date/Time   COLORURINE YELLOW 01/22/2017 1524   APPEARANCEUR CLOUDY (A) 01/22/2017 1524   LABSPEC 1.021 01/22/2017 1524   PHURINE 7.0 01/22/2017 1524   GLUCOSEU NEGATIVE 01/22/2017 1524   HGBUR NEGATIVE 01/22/2017 1524   BILIRUBINUR NEGATIVE 01/22/2017 1524   KETONESUR NEGATIVE 01/22/2017 1524   PROTEINUR NEGATIVE 01/22/2017 1524   UROBILINOGEN 0.2 10/21/2013 0200   NITRITE NEGATIVE 01/22/2017 1524   LEUKOCYTESUR NEGATIVE 01/22/2017 1524   Sepsis Labs: @LABRCNTIP (procalcitonin:4,lacticidven:4) ) Recent Results (from the past 240 hour(s))  Respiratory Panel by RT PCR (Flu A&B, Covid) - Nasopharyngeal Swab     Status: None   Collection Time: 12/11/19  4:20 PM   Specimen: Nasopharyngeal Swab  Result Value Ref Range Status   SARS Coronavirus 2 by RT PCR NEGATIVE NEGATIVE Final    Comment: (NOTE) SARS-CoV-2 target nucleic acids are NOT DETECTED. The SARS-CoV-2 RNA is generally detectable  in upper respiratoy specimens during the acute phase of infection. The lowest concentration of SARS-CoV-2 viral copies this assay can detect is 131 copies/mL. A negative result does not preclude SARS-Cov-2 infection and should not be used as the sole basis for treatment or other patient management decisions. A negative result may occur with  improper specimen collection/handling, submission of specimen other than nasopharyngeal swab, presence of viral mutation(s) within the areas targeted by this assay, and inadequate number of viral copies (<131 copies/mL). A negative result must be combined with clinical observations, patient history, and epidemiological information. The expected result is Negative. Fact Sheet for Patients:  PinkCheek.be Fact Sheet for Healthcare Providers:  GravelBags.it This test is not yet ap proved or cleared by the [place] FDA and  has been authorized for detection and/or diagnosis of SARS-CoV-2 by FDA under an Emergency Use Authorization (EUA). This EUA will remain  in effect (meaning this test can be used) for the duration of the COVID-19 declaration under Section 564(b)(1) of the Act, 21 U.S.C. section 360bbb-3(b)(1), unless the authorization is terminated or revoked sooner.    Influenza A by PCR NEGATIVE NEGATIVE Final  Influenza B by PCR NEGATIVE NEGATIVE Final    Comment: (NOTE) The Xpert Xpress SARS-CoV-2/FLU/RSV assay is intended as an aid in  the diagnosis of influenza from Nasopharyngeal swab specimens and  should not be used as a sole basis for treatment. Nasal washings and  aspirates are unacceptable for Xpert Xpress SARS-CoV-2/FLU/RSV  testing. Fact Sheet for Patients: PinkCheek.be Fact Sheet for Healthcare Providers: GravelBags.it This test is not yet approved or cleared by the [place] FDA and  has been authorized for  detection and/or diagnosis of SARS-CoV-2 by  FDA under an Emergency Use Authorization (EUA). This EUA will remain  in effect (meaning this test can be used) for the duration of the  Covid-19 declaration under Section 564(b)(1) of the Act, 21  U.S.C. section 360bbb-3(b)(1), unless the authorization is  terminated or revoked. Performed at [facility], [address], [place], [place] [address]      Scheduled Meds: . budesonide (PULMICORT) nebulizer solution  0.5 mg Nebulization BID  . enoxaparin (LOVENOX) injection  60 mg Subcutaneous Q24H  . hydrochlorothiazide  12.5 mg Oral Daily  . ipratropium-albuterol  3 mL Nebulization Q6H  . mouth rinse  15 mL Mouth Rinse BID  . methylPREDNISolone (SOLU-MEDROL) injection  60 mg Intravenous Q6H   Continuous Infusions: . azithromycin 500 mg (12/12/19 2200)    Procedures/Studies: DG Chest Portable 1 View  Result Date: 12/11/2019 CLINICAL DATA:  Shortness of breath. EXAM: PORTABLE CHEST 1 VIEW COMPARISON:  July 16, 2018. FINDINGS: The heart size and mediastinal contours are within normal limits. Lungs are clear. No pneumothorax or pleural effusion is noted. The visualized skeletal structures are unremarkable. IMPRESSION: No active disease. Electronically Signed   By: Marijo Conception M.D.   On: 12/11/2019 16:53   EEG adult  Result Date: 12/12/2019 Lora Havens, MD     12/12/2019  7:56 PM Patient Name: VERSHAWN WESTRUP MRN: [id number] Epilepsy Attending: Lora Havens Referring Physician/Provider: Dr Shanon Brow Bryona Foxworthy Date: 12/12/2019 Duration: 24.10 mins Patient history: 59 yo M with metastatic lung cancer who presented with aSOB. EEG to evaluate for seizure. Level of alertness: awake AEDs during EEG study: None Technical aspects: This EEG study was done with scalp electrodes positioned according to the 10-20 International system of electrode placement. Electrical activity was acquired at a sampling rate of 500Hz  and reviewed with a high frequency  filter of 70Hz  and a low frequency filter of 1Hz . EEG data were recorded continuously and digitally stored. DESCRIPTION: The posterior dominant rhythm consists of 9 Hz activity of moderate voltage (25-35 uV) seen predominantly in posterior head regions, symmetric and reactive to eye opening and eye closing.     Drowsiness was characterized by attenuation of the posterior background rhythm. EEG alsPhotic driving was not seen during photic stimulation. Hyperventilation was not performed. IMPRESSION: This study is within normal limits. No seizures or epileptiform discharges were seen throughout the recording. [place], DO  [facility] Pager [phone]  If 7PM-7AM, please contact night-coverage [email or web address] Password [email or web address] 12/13/2019, 5:42 PM   LOS: 1 day

## 2019-12-14 LAB — MAGNESIUM: Magnesium: 2.2 mg/dL (ref 1.7–2.4)

## 2019-12-14 LAB — BASIC METABOLIC PANEL
Anion gap: 10 (ref 5–15)
BUN: 17 mg/dL (ref 6–20)
CO2: 37 mmol/L — ABNORMAL HIGH (ref 22–32)
Calcium: 9.1 mg/dL (ref 8.9–10.3)
Chloride: 92 mmol/L — ABNORMAL LOW (ref 98–111)
Creatinine, Ser: 0.79 mg/dL (ref 0.61–1.24)
GFR calc Af Amer: >60 mL/min (ref >60)
GFR calc non Af Amer: >60 mL/min (ref >60)
Glucose, Bld: 166 mg/dL — ABNORMAL HIGH (ref 70–99)
Potassium: 4.5 mmol/L (ref 3.5–5.1)
Sodium: 139 mmol/L (ref 135–145)

## 2019-12-14 LAB — GLUCOSE, CAPILLARY
Glucose-Capillary: 121 mg/dL — ABNORMAL HIGH (ref 70–99)
Glucose-Capillary: 207 mg/dL — ABNORMAL HIGH (ref 70–99)
Glucose-Capillary: 143 mg/dL — ABNORMAL HIGH (ref 70–99)

## 2019-12-14 MED ORDER — IPRATROPIUM-ALBUTEROL 0.5-2.5 (3) MG/3ML IN SOLN
3.0000 mL | Freq: Three times a day (TID) | RESPIRATORY_TRACT | Status: DC
  Administered 2019-12-15 – 2019-12-16 (×4): 3 mL via RESPIRATORY_TRACT
  Filled 2019-12-14 (×4): qty 3

## 2019-12-14 MED ORDER — PREDNISONE 20 MG PO TABS
60.0000 mg | ORAL_TABLET | Freq: Every day | ORAL | Status: DC
  Administered 2019-12-15 – 2019-12-16 (×2): 60 mg via ORAL
  Filled 2019-12-14 (×2): qty 3

## 2019-12-14 MED ORDER — AZITHROMYCIN 250 MG PO TABS
500.0000 mg | ORAL_TABLET | ORAL | Status: DC
  Administered 2019-12-14 – 2019-12-15 (×2): 500 mg via ORAL
  Filled 2019-12-14 (×2): qty 2

## 2019-12-14 NOTE — Progress Notes (Signed)
PROGRESS NOTE  Miguel Marsh OVZ:[id number] DOB: 1961-03-06 DOA: 12/11/2019 PCP: Scotty Court, DO  Brief History: 59 y.o.malewith medical history ofmetastatic non-small cell lung cancer in remission, hypertension, chronic respiratory failure on 2 L at night, GERD, COPD, tobacco abuse presented with2 week hx ofday history of worsening shortness of breath, cough, and white sputum production that has worsened over past 2 days. The patient continues to smoke 5 cigarettes/day. He has a nearly 80 pack year history.He denied any fevers, chills, nausea, vomiting, diarrhea, abdominal pain, dysuria, hematuria. He complains of his usual right lower chest pain. In the emergency department, the patient was afebrile and hemodynamically stable with oxygen saturation 95-98% on 4 L. Chest x-ray was negative for infiltrates. The patient was started on bronchodilators and Solu-Medrol for COPD exacerbation.  Assessment/Plan: Acute on chronic respiratory failure with hypoxiaand hypercarbia -Initially on 4 Lnasal cannula>>3.5L -Normallyon 2 L,24/7 -Wean oxygen for saturation 90-92%  COPD exacerbation -Continue Pulmicort -continue IV solumedrol -Continue duo nebsduring hospitalization -continue brovana  Malignancy related pain -PMP Aware queried--receives monthly percocet 10/325 #120 from K. Georgina Quint, DNP -EKG--neg for ischemic changes -pt has had chronic Right lower chest, RUQ pain since his surgery 12/04/17 -10/20/19 CT chest--Scattered small pulmonary cysts. Diffuse bronchial wall thickening suggests airway predominant COPD. Fusiform bronchiectasis within both lower lobes, with areas of mucus plugging again noted in the periphery of the right lower lobe. 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, mediastinal mass excision and lymph node dissection with nerve sheath tumor resection 12/04/2017 at [facility] -follow up cardiothoracic at [facility]  Essential hypertension -BP remains controlled -continue HCTZ  Medication inducedHyperglycemia/Impaired glucose tolerance -2/12/21hemoglobin A1c--6.4 -start novolog ISS -elevated CBG due to steroids  Morbid obesity -AJO87.86 -Lifestyle modification  Tobacco abuse -cessation discussed       Disposition Plan: Home 12/15/19 if clinically improving and decrease oxygen demand Family Communication: Family at bedside  Consultants:none  Code Status: FULL   DVT Prophylaxis: [place] Lovenox   Procedures: As Listed in Progress Note Above  Antibiotics: Azithromycin 2/11>>>      Subjective: Pt states he is continuing to breath better.  Denies f/c, cp, sob, n/v/d, abd pain  Objective: Vitals:   12/13/19 2137 12/14/19 0510 12/14/19 0819 12/14/19 1439  BP: (!) 142/83 (!) 151/80  126/70  Pulse: 95 76  84  Resp: 18 17  18   Temp: 98.5 F (36.9 C) 98.2 F (36.8 C)  98.2 F (36.8 C)  TempSrc:    Oral  SpO2: 93% 94% 94% 96%  Weight:      Height:        Intake/Output Summary (Last 24 hours) at 12/14/2019 1517 Last data filed at 12/14/2019 7672 Gross per 24 hour  Intake 480 ml  Output --  Net 480 ml   Weight change:  Exam:   General:  Pt is alert, follows commands appropriately, not in acute distress  HEENT: No icterus, No thrush, No neck mass, [place]/AT  Cardiovascular: RRR, S1/S2, no rubs, no gallops  Respiratory: diminished breath sounds bilateral.  Bibasilar rales.   Abdomen: Soft/+BS, non tender, non distended, no guarding  Extremities: No edema, No lymphangitis, No petechiae, No rashes, no synovitis   Data Reviewed: I have personally reviewed following labs and imaging studies Basic Metabolic Panel: Recent Labs  Lab 12/11/19 1613 12/11/19 1840  12/12/19 0606 12/13/19 0626 12/14/19 0631  NA 141  --  143 140 139  K 3.3*  --  4.7 4.8 4.5  CL 87*  --  91* 90* 92*  CO2 42*  --  43* 39* 37*  GLUCOSE 173*  --  103* 122* 166*  BUN 8  --  12 14 17   CREATININE 0.95  --  0.84 0.76 0.79  CALCIUM 8.8*  --  9.0 9.4 9.1  MG  --  2.0  --  2.1 2.2   Liver Function Tests: Recent Labs  Lab 12/11/19 1613  AST 18  ALT 22  ALKPHOS 75  BILITOT 0.5  PROT 7.4  ALBUMIN 3.7   No results for input(s): LIPASE, AMYLASE in the last 168 hours. No results for input(s): AMMONIA in the last 168 hours. Coagulation Profile: No results for input(s): INR, PROTIME in the last 168 hours. CBC: Recent Labs  Lab 12/11/19 1613  WBC 8.7  NEUTROABS 6.0  HGB 16.3  HCT 55.8*  MCV 100.0  PLT 201   Cardiac Enzymes: No results for input(s): CKTOTAL, CKMB, CKMBINDEX, TROPONINI in the last 168 hours. BNP: Invalid input(s): POCBNP CBG: Recent Labs  Lab 12/14/19 0746 12/14/19 1113  GLUCAP 121* 207*   HbA1C: Recent Labs    12/13/19 0626  HGBA1C 6.4*   Urine analysis:    Component Value Date/Time   COLORURINE YELLOW 01/22/2017 1524   APPEARANCEUR CLOUDY (A) 01/22/2017 1524   LABSPEC 1.021 01/22/2017 1524   PHURINE 7.0 01/22/2017 1524   GLUCOSEU NEGATIVE 01/22/2017 1524   HGBUR NEGATIVE 01/22/2017 1524   BILIRUBINUR NEGATIVE 01/22/2017 1524   KETONESUR NEGATIVE 01/22/2017 1524   PROTEINUR NEGATIVE 01/22/2017 1524   UROBILINOGEN 0.2 10/21/2013 0200   NITRITE NEGATIVE 01/22/2017 1524   LEUKOCYTESUR NEGATIVE 01/22/2017 1524   Sepsis Labs: @LABRCNTIP (procalcitonin:4,lacticidven:4) ) Recent Results (from the past 240 hour(s))  Respiratory Panel by RT PCR (Flu A&B, Covid) - Nasopharyngeal Swab     Status: None   Collection Time: 12/11/19  4:20 PM   Specimen: Nasopharyngeal Swab  Result Value Ref Range Status   SARS Coronavirus 2 by RT PCR NEGATIVE NEGATIVE Final    Comment: (NOTE) SARS-CoV-2 target nucleic acids are NOT DETECTED. The  SARS-CoV-2 RNA is generally detectable in upper respiratoy specimens during the acute phase of infection. The lowest concentration of SARS-CoV-2 viral copies this assay can detect is 131 copies/mL. A negative result does not preclude SARS-Cov-2 infection and should not be used as the sole basis for treatment or other patient management decisions. A negative result may occur with  improper specimen collection/handling, submission of specimen other than nasopharyngeal swab, presence of viral mutation(s) within the areas targeted by this assay, and inadequate number of viral copies (<131 copies/mL). A negative result must be combined with clinical observations, patient history, and epidemiological information. The expected result is Negative. Fact Sheet for Patients:  PinkCheek.be Fact Sheet for Healthcare Providers:  GravelBags.it This test is not yet ap proved or cleared by the [place] FDA and  has been authorized for detection and/or diagnosis of SARS-CoV-2 by FDA under an Emergency Use Authorization (EUA). This EUA will remain  in effect (meaning this test can be used) for the duration of the COVID-19 declaration under Section 564(b)(1) of the Act, 21 U.S.C. section 360bbb-3(b)(1), unless the authorization is terminated or revoked sooner.    Influenza A by PCR NEGATIVE NEGATIVE Final   Influenza B by PCR NEGATIVE NEGATIVE  Final    Comment: (NOTE) The Xpert Xpress SARS-CoV-2/FLU/RSV assay is intended as an aid in  the diagnosis of influenza from Nasopharyngeal swab specimens and  should not be used as a sole basis for treatment. Nasal washings and  aspirates are unacceptable for Xpert Xpress SARS-CoV-2/FLU/RSV  testing. Fact Sheet for Patients: PinkCheek.be Fact Sheet for Healthcare Providers: GravelBags.it This test is not yet approved or cleared by the [place] FDA and  has been authorized for detection and/or diagnosis of SARS-CoV-2 by  FDA under an Emergency Use Authorization (EUA). This EUA will remain  in effect (meaning this test can be used) for the duration of the  Covid-19 declaration under Section 564(b)(1) of the Act, 21  U.S.C. section 360bbb-3(b)(1), unless the authorization is  terminated or revoked. Performed at [facility], [address], [place], [place] [address]      Scheduled Meds: . arformoterol  15 mcg Nebulization BID  . budesonide (PULMICORT) nebulizer solution  0.5 mg Nebulization BID  . enoxaparin (LOVENOX) injection  60 mg Subcutaneous Q24H  . hydrochlorothiazide  12.5 mg Oral Daily  . insulin aspart  0-9 Units Subcutaneous TID WC  . ipratropium-albuterol  3 mL Nebulization Q6H  . mouth rinse  15 mL Mouth Rinse BID  . methylPREDNISolone (SOLU-MEDROL) injection  60 mg Intravenous Q6H   Continuous Infusions: . azithromycin 500 mg (12/14/19 0010)    Procedures/Studies: DG Chest Portable 1 View  Result Date: 12/11/2019 CLINICAL DATA:  Shortness of breath. EXAM: PORTABLE CHEST 1 VIEW COMPARISON:  July 16, 2018. FINDINGS: The heart size and mediastinal contours are within normal limits. Lungs are clear. No pneumothorax or pleural effusion is noted. The visualized skeletal structures are unremarkable. IMPRESSION: No active disease. Electronically Signed   By: Marijo Conception M.D.   On: 12/11/2019 16:53   EEG adult  Result Date: 12/12/2019 Lora Havens, MD     12/12/2019  7:56 PM Patient Name: NYSHAWN GOWDY MRN: [id number] Epilepsy Attending: Lora Havens Referring Physician/Provider: Dr Shanon Brow Yajahira Tison Date: 12/12/2019 Duration: 24.10 mins Patient history: 59 yo M with metastatic lung cancer who presented with aSOB. EEG to evaluate for seizure. Level of alertness: awake AEDs during EEG study: None Technical aspects: This EEG study was done with scalp electrodes positioned according to the 10-20  International system of electrode placement. Electrical activity was acquired at a sampling rate of 500Hz  and reviewed with a high frequency filter of 70Hz  and a low frequency filter of 1Hz . EEG data were recorded continuously and digitally stored. DESCRIPTION: The posterior dominant rhythm consists of 9 Hz activity of moderate voltage (25-35 uV) seen predominantly in posterior head regions, symmetric and reactive to eye opening and eye closing.     Drowsiness was characterized by attenuation of the posterior background rhythm. EEG alsPhotic driving was not seen during photic stimulation. Hyperventilation was not performed. IMPRESSION: This study is within normal limits. No seizures or epileptiform discharges were seen throughout the recording. [place], DO  [facility] Pager [phone]  If 7PM-7AM, please contact night-coverage [email or web address] Password [email or web address] 12/14/2019, 3:17 PM   LOS: 2 days

## 2019-12-14 NOTE — Discharge Summary (Signed)
Physician Discharge Summary  JERIKO KOWALKE HKV:[id number] DOB: Jan 27, 1961 DOA: 12/11/2019  PCP: Scotty Court, DO  Admit date: 12/11/2019 Discharge date: 12/16/2019  Admitted From: Home Disposition:  Home   Recommendations for Outpatient Follow-up:  1. Follow up with PCP in 1-2 weeks 2. Please obtain BMP/CBC in one week   Home Health: yes Equipment/Devices: oxygen 2-3 L [place] Discharge Condition: Stable CODE STATUS:FULL Diet recommendation: Heart Healthy / Carb Modified   Brief/Interim Summary: 59 y.o.malewith medical history ofmetastatic non-small cell lung cancer in remission, hypertension, chronic respiratory failure on 2 L at night, GERD, COPD, tobacco abuse presented with2 week hx ofday history of worsening shortness of breath, cough, and white sputum production that has worsened over past 2 days. The patient continues to smoke 5 cigarettes/day. He has a nearly 80 pack year history.He denied any fevers, chills, nausea, vomiting, diarrhea, abdominal pain, dysuria, hematuria. He complains of his usual right lower chest pain. In the emergency department, the patient was afebrile and hemodynamically stable with oxygen saturation 95-98% on 4 L. Chest x-ray was negative for infiltrates. The patient was started on bronchodilators and Solu-Medrol for COPD exacerbation.  Discharge Diagnoses:  Acute on chronic respiratory failure with hypoxiaand hypercarbia -Initially on 4 Lnasal cannula>>3L -Normallyon 2 L,24/7 -Wean oxygen for saturation90-92% -d/c home with 3L and continue to wean at home  COPD exacerbation -ContinuePulmicort -continue IV solumedrol>>>home with prednisone taper -Continue duo nebsduring hospitalization -continue brovana during hospitalization  Malignancy related pain -PMP Aware queried--receives monthly percocet 10/325 #120 from K. Georgina Quint, DNP -EKG--neg for ischemic changes -pt has had chronic Right lower chest, RUQ pain since his  surgery 12/04/17 -10/20/19 CT chest--Scattered small pulmonary cysts. Diffuse bronchial wall thickening suggests airway predominant COPD. Fusiform bronchiectasis within both lower lobes, with areas of mucus plugging again noted in the periphery of the right lower lobe. Scarring in the periphery of the right lower lobe  Non-small cell lung cancer, metastatic -Presently in remission -[facility] -Diagnosed 06/11/2015  -Received Pembrolizumab9/09/2015-07/16/2017  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, mediastinal mass excision and lymph node dissection with nerve sheath tumor resection 12/04/2017 at [facility] [place] -follow up cardiothoracic at [facility]  Essential hypertension -BP remains controlled -continue HCTZ  Medication inducedHyperglycemia/Impaired glucose tolerance -2/12/21hemoglobin A1c--6.4 -start novolog ISS -elevated CBG due to steroids  Morbid obesity -FIE33.29 -Lifestyle modification  Tobacco abuse -cessation discussed   Discharge Instructions   Allergies as of 12/16/2019   No Known Allergies     Medication List    TAKE these medications   albuterol 108 (90 Base) MCG/ACT inhaler Commonly known as: VENTOLIN HFA Inhale 2 puffs into the lungs every 6 (six) hours as needed for wheezing. What changed: Another medication with the same name was removed. Continue taking this medication, and follow the directions you see here.   guaiFENesin 600 MG 12 hr tablet Commonly known as: MUCINEX Take 1 tablet (600 mg total) by mouth 2 (two) times daily. What changed:   when to take this  reasons to take this   hydrochlorothiazide 12.5 MG tablet Commonly known as: HYDRODIURIL Take 1 tablet (12.5 mg total) by mouth daily. What changed:   when to take this  reasons to take this   ibuprofen 800 MG tablet Commonly known as: ADVIL Take 800 mg by mouth daily as needed for mild pain or moderate pain.   ipratropium-albuterol 0.5-2.5 (3) MG/3ML  Soln Commonly known as: DUONEB Take 3 mLs by nebulization every 4 (four) hours as needed.   oxyCODONE-acetaminophen  10-325 MG tablet Commonly known as: PERCOCET Take 1 tablet by mouth every 6 (six) hours as needed for pain.   predniSONE 10 MG tablet Commonly known as: DELTASONE Take 6 tablets (60 mg total) by mouth daily with breakfast. And decrease by one tablet daily Start taking on: December 17, 2019   Trelegy Ellipta 100-62.5-25 MCG/INH Aepb Generic drug: Fluticasone-Umeclidin-Vilant Take 1 puff by mouth daily.       No Known Allergies  Consultations:  none   Procedures/Studies: DG Chest Portable 1 View  Result Date: 12/11/2019 CLINICAL DATA:  Shortness of breath. EXAM: PORTABLE CHEST 1 VIEW COMPARISON:  July 16, 2018. FINDINGS: The heart size and mediastinal contours are within normal limits. Lungs are clear. No pneumothorax or pleural effusion is noted. The visualized skeletal structures are unremarkable. IMPRESSION: No active disease. Electronically Signed   By: Marijo Conception M.D.   On: 12/11/2019 16:53   EEG adult  Result Date: 12/12/2019 Lora Havens, MD     12/12/2019  7:56 PM Patient Name: Miguel Marsh MRN: [id number] Epilepsy Attending: Lora Havens Referring Physician/Provider: Dr Shanon Brow Latiqua Daloia Date: 12/12/2019 Duration: 24.10 mins Patient history: 59 yo M with metastatic lung cancer who presented with aSOB. EEG to evaluate for seizure. Level of alertness: awake AEDs during EEG study: None Technical aspects: This EEG study was done with scalp electrodes positioned according to the 10-20 International system of electrode placement. Electrical activity was acquired at a sampling rate of 500Hz  and reviewed with a high frequency filter of 70Hz  and a low frequency filter of 1Hz . EEG data were recorded continuously and digitally stored. DESCRIPTION: The posterior dominant rhythm consists of 9 Hz activity of moderate voltage (25-35 uV) seen predominantly in  posterior head regions, symmetric and reactive to eye opening and eye closing.     Drowsiness was characterized by attenuation of the posterior background rhythm. EEG alsPhotic driving was not seen during photic stimulation. Hyperventilation was not performed. IMPRESSION: This study is within normal limits. No seizures or epileptiform discharges were seen throughout the recording. Lora Havens        Discharge Exam: Vitals:   12/16/19 0516 12/16/19 1029  BP: 136/77   Pulse: 70   Resp: 20   Temp: 98.5 F (36.9 C)   SpO2: 100% 100%   Vitals:   12/15/19 2001 12/15/19 2152 12/16/19 0516 12/16/19 1029  BP:  126/73 136/77   Pulse:  87 70   Resp:  20 20   Temp:  98.1 F (36.7 C) 98.5 F (36.9 C)   TempSrc:  Oral Oral   SpO2: 93% 96% 100% 100%  Weight:      Height:        General: Pt is alert, awake, not in acute distress Cardiovascular: RRR, S1/S2 +, no rubs, no gallops Respiratory: diminished breath sounds. No wheeze.  Bibasilar rales Abdominal: Soft, NT, ND, bowel sounds + Extremities: no edema, no cyanosis   The results of significant diagnostics from this hospitalization (including imaging, microbiology, ancillary and laboratory) are listed below for reference.    Significant Diagnostic Studies: DG Chest Portable 1 View  Result Date: 12/11/2019 CLINICAL DATA:  Shortness of breath. EXAM: PORTABLE CHEST 1 VIEW COMPARISON:  July 16, 2018. FINDINGS: The heart size and mediastinal contours are within normal limits. Lungs are clear. No pneumothorax or pleural effusion is noted. The visualized skeletal structures are unremarkable. IMPRESSION: No active disease. Electronically Signed   By: Marijo Conception M.D.   On:  12/11/2019 16:53   EEG adult  Result Date: 12/12/2019 Lora Havens, MD     12/12/2019  7:56 PM Patient Name: Miguel Marsh MRN: [id number] Epilepsy Attending: Lora Havens Referring Physician/Provider: Dr Shanon Brow Christion Leonhard Date: 12/12/2019 Duration: 24.10  mins Patient history: 59 yo M with metastatic lung cancer who presented with aSOB. EEG to evaluate for seizure. Level of alertness: awake AEDs during EEG study: None Technical aspects: This EEG study was done with scalp electrodes positioned according to the 10-20 International system of electrode placement. Electrical activity was acquired at a sampling rate of 500Hz  and reviewed with a high frequency filter of 70Hz  and a low frequency filter of 1Hz . EEG data were recorded continuously and digitally stored. DESCRIPTION: The posterior dominant rhythm consists of 9 Hz activity of moderate voltage (25-35 uV) seen predominantly in posterior head regions, symmetric and reactive to eye opening and eye closing.     Drowsiness was characterized by attenuation of the posterior background rhythm. EEG alsPhotic driving was not seen during photic stimulation. Hyperventilation was not performed. IMPRESSION: This study is within normal limits. No seizures or epileptiform discharges were seen throughout the recording. Lora Havens     Microbiology: Recent Results (from the past 240 hour(s))  Respiratory Panel by RT PCR (Flu A&B, Covid) - Nasopharyngeal Swab     Status: None   Collection Time: 12/11/19  4:20 PM   Specimen: Nasopharyngeal Swab  Result Value Ref Range Status   SARS Coronavirus 2 by RT PCR NEGATIVE NEGATIVE Final    Comment: (NOTE) SARS-CoV-2 target nucleic acids are NOT DETECTED. The SARS-CoV-2 RNA is generally detectable in upper respiratoy specimens during the acute phase of infection. The lowest concentration of SARS-CoV-2 viral copies this assay can detect is 131 copies/mL. A negative result does not preclude SARS-Cov-2 infection and should not be used as the sole basis for treatment or other patient management decisions. A negative result may occur with  improper specimen collection/handling, submission of specimen other than nasopharyngeal swab, presence of viral mutation(s) within  the areas targeted by this assay, and inadequate number of viral copies (<131 copies/mL). A negative result must be combined with clinical observations, patient history, and epidemiological information. The expected result is Negative. Fact Sheet for Patients:  PinkCheek.be Fact Sheet for Healthcare Providers:  GravelBags.it This test is not yet ap proved or cleared by the [place] FDA and  has been authorized for detection and/or diagnosis of SARS-CoV-2 by FDA under an Emergency Use Authorization (EUA). This EUA will remain  in effect (meaning this test can be used) for the duration of the COVID-19 declaration under Section 564(b)(1) of the Act, 21 U.S.C. section 360bbb-3(b)(1), unless the authorization is terminated or revoked sooner.    Influenza A by PCR NEGATIVE NEGATIVE Final   Influenza B by PCR NEGATIVE NEGATIVE Final    Comment: (NOTE) The Xpert Xpress SARS-CoV-2/FLU/RSV assay is intended as an aid in  the diagnosis of influenza from Nasopharyngeal swab specimens and  should not be used as a sole basis for treatment. Nasal washings and  aspirates are unacceptable for Xpert Xpress SARS-CoV-2/FLU/RSV  testing. Fact Sheet for Patients: PinkCheek.be Fact Sheet for Healthcare Providers: GravelBags.it This test is not yet approved or cleared by the [place] FDA and  has been authorized for detection and/or diagnosis of SARS-CoV-2 by  FDA under an Emergency Use Authorization (EUA). This EUA will remain  in effect (meaning this test can be used) for the duration of the  Covid-19 declaration under Section 564(b)(1) of the Act, 21  U.S.C. section 360bbb-3(b)(1), unless the authorization is  terminated or revoked. Performed at [facility], [address], [place], [place] [address]      Labs: Basic Metabolic Panel: Recent Labs  Lab 12/11/19 1613  12/11/19 1613 12/11/19 1840 12/12/19 0606 12/12/19 0606 12/13/19 0626 12/13/19 0626 12/14/19 0631 12/16/19 0427  NA 141  --   --  143  --  140  --  139 140  K 3.3*   < >  --  4.7   < > 4.8   < > 4.5 4.4  CL 87*  --   --  91*  --  90*  --  92* 92*  CO2 42*  --   --  43*  --  39*  --  37* 39*  GLUCOSE 173*  --   --  103*  --  122*  --  166* 123*  BUN 8  --   --  12  --  14  --  17 21*  CREATININE 0.95  --   --  0.84  --  0.76  --  0.79 0.93  CALCIUM 8.8*  --   --  9.0  --  9.4  --  9.1 8.9  MG  --   --  2.0  --   --  2.1  --  2.2 2.2   < > = values in this interval not displayed.   Liver Function Tests: Recent Labs  Lab 12/11/19 1613  AST 18  ALT 22  ALKPHOS 75  BILITOT 0.5  PROT 7.4  ALBUMIN 3.7   No results for input(s): LIPASE, AMYLASE in the last 168 hours. No results for input(s): AMMONIA in the last 168 hours. CBC: Recent Labs  Lab 12/11/19 1613  WBC 8.7  NEUTROABS 6.0  HGB 16.3  HCT 55.8*  MCV 100.0  PLT 201   Cardiac Enzymes: No results for input(s): CKTOTAL, CKMB, CKMBINDEX, TROPONINI in the last 168 hours. BNP: Invalid input(s): POCBNP CBG: Recent Labs  Lab 12/15/19 0731 12/15/19 1102 12/15/19 1600 12/15/19 2153 12/16/19 0722  GLUCAP 153* 183* 237* 248* 145*    Time coordinating discharge:  36 minutes  Signed:  [name], DO [facility] Pager: [phone] 12/16/2019, 11:22 AM

## 2019-12-15 LAB — GLUCOSE, CAPILLARY
Glucose-Capillary: 153 mg/dL — ABNORMAL HIGH (ref 70–99)
Glucose-Capillary: 183 mg/dL — ABNORMAL HIGH (ref 70–99)
Glucose-Capillary: 237 mg/dL — ABNORMAL HIGH (ref 70–99)
Glucose-Capillary: 248 mg/dL — ABNORMAL HIGH (ref 70–99)

## 2019-12-15 NOTE — Care Management Important Message (Signed)
Important Message  Patient Details  Name: Miguel Marsh MRN: [id number] Date of Birth: 1961/03/15   Medicare Important Message Given:  Yes     Tommy Medal 12/15/2019, 2:21 PM

## 2019-12-15 NOTE — Plan of Care (Signed)

## 2019-12-15 NOTE — Progress Notes (Signed)
PROGRESS NOTE  Miguel Marsh MEQ:[id number] DOB: 1961-09-04 DOA: 12/11/2019 PCP: Scotty Court, DO  Brief History: 59 y.o.malewith medical history ofmetastatic non-small cell lung cancer in remission, hypertension, chronic respiratory failure on 2 L at night, GERD, COPD, tobacco abuse presented with2 week hx ofday history of worsening shortness of breath, cough, and white sputum production that has worsened over past 2 days. The patient continues to smoke 5 cigarettes/day. He has a nearly 80 pack year history.He denied any fevers, chills, nausea, vomiting, diarrhea, abdominal pain, dysuria, hematuria. He complains of his usual right lower chest pain. In the emergency department, the patient was afebrile and hemodynamically stable with oxygen saturation 95-98% on 4 L. Chest x-ray was negative for infiltrates. The patient was started on bronchodilators and Solu-Medrol for COPD exacerbation.  Assessment/Plan: Acute on chronic respiratory failure with hypoxiaand hypercarbia -Initially on 4 Lnasal cannula>>3.5L -Normallyon 2 L,24/7 -Wean oxygen for saturation90-92%  COPD exacerbation -ContinuePulmicort -continue IV solumedrol>>>prednisone -Continue duo nebsduring hospitalization -continue brovana  Malignancy related pain -PMP Aware queried--receives monthly percocet 10/325 #120 from K. Georgina Quint, DNP -EKG--neg for ischemic changes -pt has had chronic Right lower chest, RUQ pain since his surgery 12/04/17 -10/20/19 CT chest--Scattered small pulmonary cysts. Diffuse bronchial wall thickening suggests airway predominant COPD. Fusiform bronchiectasis within both lower lobes, with areas of mucus plugging again noted in the periphery of the right lower lobe. 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 -follow up cardiothoracic at [facility]  Essential hypertension -BP remains controlled -continue HCTZ  Medication inducedHyperglycemia/Impaired glucose tolerance -2/12/21hemoglobin A1c--6.4 -start novolog ISS -elevated CBG due to steroids  Morbid obesity -WLN98.92 -Lifestyle modification  Tobacco abuse -cessation discussed       Disposition Plan: Home 12/15/19 if clinically improving and decrease oxygen demand Family Communication: Family at bedside  Consultants:none  Code Status: FULL   DVT Prophylaxis: [place] Lovenox   Procedures: As Listed in Progress Note Above  Antibiotics: Azithromycin 2/11>>>        Subjective: Pt breathing better.  Still has cough, dry.  No hemoptysis.    Objective: Vitals:   12/15/19 1004 12/15/19 1018 12/15/19 1322 12/15/19 1444  BP:   (!) 149/83   Pulse:   71   Resp:   19   Temp:   98.3 F (36.8 C)   TempSrc:      SpO2: 97% 97% 97% 93%  Weight:      Height:        Intake/Output Summary (Last 24 hours) at 12/15/2019 1932 Last data filed at 12/15/2019 1700 Gross per 24 hour  Intake 720 ml  Output --  Net 720 ml   Weight change:  Exam:   General:  Pt is alert, follows commands appropriately, not in acute distress  HEENT: No icterus, No thrush, No neck mass, [place]/AT  Cardiovascular: RRR, S1/S2, no rubs, no gallops  Respiratory: diminished BS. No wheeze.  Bibasilar rales  Abdomen: Soft/+BS, non tender, non distended, no guarding  Extremities: No edema, No lymphangitis, No petechiae, No rashes, no synovitis   Data Reviewed: I have personally reviewed following labs and imaging studies Basic Metabolic Panel: Recent Labs  Lab 12/11/19 1613 12/11/19 1840 12/12/19 0606 12/13/19 1194 12/14/19 1740  NA 141  --  143 140 139  K 3.3*  --  4.7 4.8 4.5  CL 87*  --  91* 90* 92*  CO2 42*  --  43* 39* 37*  GLUCOSE 173*  --  103* 122* 166*  BUN 8  --  12 14 17   CREATININE 0.95  --  0.84 0.76 0.79  CALCIUM 8.8*  --  9.0 9.4 9.1  MG  --  2.0  --  2.1 2.2   Liver Function Tests: Recent Labs  Lab 12/11/19 1613  AST 18  ALT 22  ALKPHOS 75  BILITOT 0.5  PROT 7.4  ALBUMIN 3.7   No results for input(s): LIPASE, AMYLASE in the last 168 hours. No results for input(s): AMMONIA in the last 168 hours. Coagulation Profile: No results for input(s): INR, PROTIME in the last 168 hours. CBC: Recent Labs  Lab 12/11/19 1613  WBC 8.7  NEUTROABS 6.0  HGB 16.3  HCT 55.8*  MCV 100.0  PLT 201   Cardiac Enzymes: No results for input(s): CKTOTAL, CKMB, CKMBINDEX, TROPONINI in the last 168 hours. BNP: Invalid input(s): POCBNP CBG: Recent Labs  Lab 12/14/19 1113 12/14/19 1613 12/15/19 0731 12/15/19 1102 12/15/19 1600  GLUCAP 207* 143* 153* 183* 237*   HbA1C: Recent Labs    12/13/19 0626  HGBA1C 6.4*   Urine analysis:    Component Value Date/Time   COLORURINE YELLOW 01/22/2017 1524   APPEARANCEUR CLOUDY (A) 01/22/2017 1524   LABSPEC 1.021 01/22/2017 1524   PHURINE 7.0 01/22/2017 1524   GLUCOSEU NEGATIVE 01/22/2017 1524   HGBUR NEGATIVE 01/22/2017 1524   BILIRUBINUR NEGATIVE 01/22/2017 1524   KETONESUR NEGATIVE 01/22/2017 1524   PROTEINUR NEGATIVE 01/22/2017 1524   UROBILINOGEN 0.2 10/21/2013 0200   NITRITE NEGATIVE 01/22/2017 1524   LEUKOCYTESUR NEGATIVE 01/22/2017 1524   Sepsis Labs: @LABRCNTIP (procalcitonin:4,lacticidven:4) ) Recent Results (from the past 240 hour(s))  Respiratory Panel by RT PCR (Flu A&B, Covid) - Nasopharyngeal Swab     Status: None   Collection Time: 12/11/19  4:20 PM   Specimen: Nasopharyngeal Swab  Result Value Ref Range Status   SARS Coronavirus 2 by RT PCR NEGATIVE NEGATIVE Final    Comment: (NOTE) SARS-CoV-2 target nucleic acids are NOT  DETECTED. The SARS-CoV-2 RNA is generally detectable in upper respiratoy specimens during the acute phase of infection. The lowest concentration of SARS-CoV-2 viral copies this assay can detect is 131 copies/mL. A negative result does not preclude SARS-Cov-2 infection and should not be used as the sole basis for treatment or other patient management decisions. A negative result may occur with  improper specimen collection/handling, submission of specimen other than nasopharyngeal swab, presence of viral mutation(s) within the areas targeted by this assay, and inadequate number of viral copies (<131 copies/mL). A negative result must be combined with clinical observations, patient history, and epidemiological information. The expected result is Negative. Fact Sheet for Patients:  PinkCheek.be Fact Sheet for Healthcare Providers:  GravelBags.it This test is not yet ap proved or cleared by the [place] FDA and  has been authorized for detection and/or diagnosis of SARS-CoV-2 by FDA under an Emergency Use Authorization (EUA). This EUA will remain  in effect (meaning this test can be used) for the duration of the COVID-19 declaration under Section 564(b)(1) of the Act, 21 U.S.C. section 360bbb-3(b)(1), unless the authorization is terminated or revoked sooner.    Influenza A by PCR NEGATIVE NEGATIVE Final   Influenza B by PCR NEGATIVE NEGATIVE Final  Comment: (NOTE) The Xpert Xpress SARS-CoV-2/FLU/RSV assay is intended as an aid in  the diagnosis of influenza from Nasopharyngeal swab specimens and  should not be used as a sole basis for treatment. Nasal washings and  aspirates are unacceptable for Xpert Xpress SARS-CoV-2/FLU/RSV  testing. Fact Sheet for Patients: PinkCheek.be Fact Sheet for Healthcare Providers: GravelBags.it This test is not yet approved or cleared  by the [place] FDA and  has been authorized for detection and/or diagnosis of SARS-CoV-2 by  FDA under an Emergency Use Authorization (EUA). This EUA will remain  in effect (meaning this test can be used) for the duration of the  Covid-19 declaration under Section 564(b)(1) of the Act, 21  U.S.C. section 360bbb-3(b)(1), unless the authorization is  terminated or revoked. Performed at [facility], [address], [place], [place] [address]      Scheduled Meds: . arformoterol  15 mcg Nebulization BID  . azithromycin  500 mg Oral Q24H  . budesonide (PULMICORT) nebulizer solution  0.5 mg Nebulization BID  . enoxaparin (LOVENOX) injection  60 mg Subcutaneous Q24H  . hydrochlorothiazide  12.5 mg Oral Daily  . insulin aspart  0-9 Units Subcutaneous TID WC  . ipratropium-albuterol  3 mL Nebulization TID  . mouth rinse  15 mL Mouth Rinse BID  . predniSONE  60 mg Oral Q breakfast   Continuous Infusions:  Procedures/Studies: DG Chest Portable 1 View  Result Date: 12/11/2019 CLINICAL DATA:  Shortness of breath. EXAM: PORTABLE CHEST 1 VIEW COMPARISON:  July 16, 2018. FINDINGS: The heart size and mediastinal contours are within normal limits. Lungs are clear. No pneumothorax or pleural effusion is noted. The visualized skeletal structures are unremarkable. IMPRESSION: No active disease. Electronically Signed   By: Marijo Conception M.D.   On: 12/11/2019 16:53   EEG adult  Result Date: 12/12/2019 Lora Havens, MD     12/12/2019  7:56 PM Patient Name: ALLEX LAPOINT MRN: [id number] Epilepsy Attending: Lora Havens Referring Physician/Provider: Dr Shanon Brow Tatem Fesler Date: 12/12/2019 Duration: 24.10 mins Patient history: 59 yo M with metastatic lung cancer who presented with aSOB. EEG to evaluate for seizure. Level of alertness: awake AEDs during EEG study: None Technical aspects: This EEG study was done with scalp electrodes positioned according to the 10-20 International system of electrode  placement. Electrical activity was acquired at a sampling rate of 500Hz  and reviewed with a high frequency filter of 70Hz  and a low frequency filter of 1Hz . EEG data were recorded continuously and digitally stored. DESCRIPTION: The posterior dominant rhythm consists of 9 Hz activity of moderate voltage (25-35 uV) seen predominantly in posterior head regions, symmetric and reactive to eye opening and eye closing.     Drowsiness was characterized by attenuation of the posterior background rhythm. EEG alsPhotic driving was not seen during photic stimulation. Hyperventilation was not performed. IMPRESSION: This study is within normal limits. No seizures or epileptiform discharges were seen throughout the recording. [place], DO  [facility] Pager [phone]  If 7PM-7AM, please contact night-coverage [email or web address] Password [facility] 12/15/2019, 7:32 PM   LOS: 3 days

## 2019-12-16 LAB — BASIC METABOLIC PANEL
Anion gap: 9 (ref 5–15)
BUN: 21 mg/dL — ABNORMAL HIGH (ref 6–20)
CO2: 39 mmol/L — ABNORMAL HIGH (ref 22–32)
Calcium: 8.9 mg/dL (ref 8.9–10.3)
Chloride: 92 mmol/L — ABNORMAL LOW (ref 98–111)
Creatinine, Ser: 0.93 mg/dL (ref 0.61–1.24)
GFR calc Af Amer: >60 mL/min (ref >60)
GFR calc non Af Amer: >60 mL/min (ref >60)
Glucose, Bld: 123 mg/dL — ABNORMAL HIGH (ref 70–99)
Potassium: 4.4 mmol/L (ref 3.5–5.1)
Sodium: 140 mmol/L (ref 135–145)

## 2019-12-16 LAB — GLUCOSE, CAPILLARY: Glucose-Capillary: 145 mg/dL — ABNORMAL HIGH (ref 70–99)

## 2019-12-16 LAB — MAGNESIUM: Magnesium: 2.2 mg/dL (ref 1.7–2.4)

## 2019-12-16 MED ORDER — PREDNISONE 10 MG PO TABS: 60.0000 mg | ORAL_TABLET | Freq: Every day | ORAL | 0 refills | Status: DC

## 2019-12-16 MED ORDER — IPRATROPIUM-ALBUTEROL 0.5-2.5 (3) MG/3ML IN SOLN: 3.0000 mL | RESPIRATORY_TRACT | 1 refills | Status: DC | PRN

## 2020-01-22 ENCOUNTER — Other Ambulatory Visit: Payer: Self-pay

## 2020-01-22 ENCOUNTER — Emergency Department (HOSPITAL_COMMUNITY): Payer: Medicare HMO

## 2020-01-22 ENCOUNTER — Inpatient Hospital Stay (HOSPITAL_COMMUNITY)
Admission: EM | Admit: 2020-01-22 | Discharge: 2020-01-28 | DRG: 208 | Disposition: A | Discharge status: Home / Self Care | Payer: Medicare HMO | Attending: Internal Medicine | Admitting: Internal Medicine

## 2020-01-22 DIAGNOSIS — Z6836 Body mass index (BMI) 36.0-36.9, adult: Secondary | ICD-10-CM | POA: Diagnosis not present

## 2020-01-22 DIAGNOSIS — E8770 Fluid overload, unspecified: Secondary | ICD-10-CM | POA: Diagnosis not present

## 2020-01-22 DIAGNOSIS — Z7951 Long term (current) use of inhaled steroids: Secondary | ICD-10-CM

## 2020-01-22 DIAGNOSIS — C3491 Malignant neoplasm of unspecified part of right bronchus or lung: Secondary | ICD-10-CM

## 2020-01-22 DIAGNOSIS — J9620 Acute and chronic respiratory failure, unspecified whether with hypoxia or hypercapnia: Secondary | ICD-10-CM | POA: Diagnosis present

## 2020-01-22 DIAGNOSIS — J441 Chronic obstructive pulmonary disease with (acute) exacerbation: Secondary | ICD-10-CM | POA: Diagnosis present

## 2020-01-22 DIAGNOSIS — I10 Essential (primary) hypertension: Secondary | ICD-10-CM | POA: Diagnosis present

## 2020-01-22 DIAGNOSIS — I959 Hypotension, unspecified: Secondary | ICD-10-CM | POA: Diagnosis not present

## 2020-01-22 DIAGNOSIS — J9601 Acute respiratory failure with hypoxia: Secondary | ICD-10-CM

## 2020-01-22 DIAGNOSIS — J9622 Acute and chronic respiratory failure with hypercapnia: Secondary | ICD-10-CM | POA: Diagnosis present

## 2020-01-22 DIAGNOSIS — M1990 Unspecified osteoarthritis, unspecified site: Secondary | ICD-10-CM | POA: Diagnosis present

## 2020-01-22 DIAGNOSIS — Z9981 Dependence on supplemental oxygen: Secondary | ICD-10-CM | POA: Diagnosis not present

## 2020-01-22 DIAGNOSIS — J9690 Respiratory failure, unspecified, unspecified whether with hypoxia or hypercapnia: Secondary | ICD-10-CM | POA: Diagnosis present

## 2020-01-22 DIAGNOSIS — F17200 Nicotine dependence, unspecified, uncomplicated: Secondary | ICD-10-CM | POA: Diagnosis present

## 2020-01-22 DIAGNOSIS — Z79899 Other long term (current) drug therapy: Secondary | ICD-10-CM

## 2020-01-22 DIAGNOSIS — E669 Obesity, unspecified: Secondary | ICD-10-CM | POA: Diagnosis present

## 2020-01-22 DIAGNOSIS — Z20822 Contact with and (suspected) exposure to covid-19: Secondary | ICD-10-CM | POA: Diagnosis present

## 2020-01-22 DIAGNOSIS — E872 Acidosis: Secondary | ICD-10-CM | POA: Diagnosis present

## 2020-01-22 DIAGNOSIS — J9621 Acute and chronic respiratory failure with hypoxia: Principal | ICD-10-CM | POA: Diagnosis present

## 2020-01-22 DIAGNOSIS — F17210 Nicotine dependence, cigarettes, uncomplicated: Secondary | ICD-10-CM | POA: Diagnosis present

## 2020-01-22 DIAGNOSIS — Z85118 Personal history of other malignant neoplasm of bronchus and lung: Secondary | ICD-10-CM | POA: Diagnosis not present

## 2020-01-22 DIAGNOSIS — J9611 Chronic respiratory failure with hypoxia: Secondary | ICD-10-CM | POA: Diagnosis present

## 2020-01-22 LAB — LACTIC ACID, PLASMA
Lactic Acid, Venous: 0.8 mmol/L (ref 0.5–1.9)
Lactic Acid, Venous: 1.3 mmol/L (ref 0.5–1.9)

## 2020-01-22 LAB — URINALYSIS, ROUTINE W REFLEX MICROSCOPIC
Bilirubin Urine: NEGATIVE
Glucose, UA: NEGATIVE mg/dL
Hgb urine dipstick: NEGATIVE
Ketones, ur: NEGATIVE mg/dL
Leukocytes,Ua: NEGATIVE
Nitrite: NEGATIVE
Protein, ur: NEGATIVE mg/dL
Specific Gravity, Urine: 1.017 (ref 1.005–1.030)
pH: 6 (ref 5.0–8.0)

## 2020-01-22 LAB — COMPREHENSIVE METABOLIC PANEL
ALT: 49 U/L — ABNORMAL HIGH (ref 0–44)
AST: 29 U/L (ref 15–41)
Albumin: 3.7 g/dL (ref 3.5–5.0)
Alkaline Phosphatase: 65 U/L (ref 38–126)
Anion gap: 8 (ref 5–15)
BUN: 28 mg/dL — ABNORMAL HIGH (ref 6–20)
CO2: 44 mmol/L — ABNORMAL HIGH (ref 22–32)
Calcium: 8.9 mg/dL (ref 8.9–10.3)
Chloride: 87 mmol/L — ABNORMAL LOW (ref 98–111)
Creatinine, Ser: 0.9 mg/dL (ref 0.61–1.24)
GFR calc Af Amer: >60 mL/min (ref >60)
GFR calc non Af Amer: >60 mL/min (ref >60)
Glucose, Bld: 100 mg/dL — ABNORMAL HIGH (ref 70–99)
Potassium: 4.3 mmol/L (ref 3.5–5.1)
Sodium: 139 mmol/L (ref 135–145)
Total Bilirubin: 0.8 mg/dL (ref 0.3–1.2)
Total Protein: 7.1 g/dL (ref 6.5–8.1)

## 2020-01-22 LAB — BLOOD GAS, ARTERIAL
Acid-Base Excess: 16.8 mmol/L — ABNORMAL HIGH (ref 0.0–2.0)
Bicarbonate: 35.7 mmol/L — ABNORMAL HIGH (ref 20.0–28.0)
FIO2: 40
O2 Saturation: 91 %
Patient temperature: 35.2
pCO2 arterial: 96.7 mmHg (ref 32.0–48.0)
pH, Arterial: 7.281 — ABNORMAL LOW (ref 7.350–7.450)
pO2, Arterial: 64.6 mmHg — ABNORMAL LOW (ref 83.0–108.0)

## 2020-01-22 LAB — TROPONIN I (HIGH SENSITIVITY)
Troponin I (High Sensitivity): 88 ng/L — ABNORMAL HIGH (ref <18)
Troponin I (High Sensitivity): 82 ng/L — ABNORMAL HIGH (ref <18)

## 2020-01-22 LAB — RESPIRATORY PANEL BY RT PCR (FLU A&B, COVID)
Influenza A by PCR: NEGATIVE
Influenza B by PCR: NEGATIVE
SARS Coronavirus 2 by RT PCR: NEGATIVE

## 2020-01-22 LAB — CBC WITH DIFFERENTIAL/PLATELET
Abs Immature Granulocytes: 0.09 10*3/uL — ABNORMAL HIGH (ref 0.00–0.07)
Basophils Absolute: 0.1 10*3/uL (ref 0.0–0.1)
Basophils Relative: 1 %
Eosinophils Absolute: 0 10*3/uL (ref 0.0–0.5)
Eosinophils Relative: 0 %
HCT: 56 % — ABNORMAL HIGH (ref 39.0–52.0)
Hemoglobin: 16.2 g/dL (ref 13.0–17.0)
Immature Granulocytes: 1 %
Lymphocytes Relative: 17 %
Lymphs Abs: 1.9 10*3/uL (ref 0.7–4.0)
MCH: 28.5 pg (ref 26.0–34.0)
MCHC: 28.9 g/dL — ABNORMAL LOW (ref 30.0–36.0)
MCV: 98.6 fL (ref 80.0–100.0)
Monocytes Absolute: 1.6 10*3/uL — ABNORMAL HIGH (ref 0.1–1.0)
Monocytes Relative: 14 %
Neutro Abs: 7.8 10*3/uL — ABNORMAL HIGH (ref 1.7–7.7)
Neutrophils Relative %: 67 %
Platelets: 224 10*3/uL (ref 150–400)
RBC: 5.68 MIL/uL (ref 4.22–5.81)
RDW: 14.1 % (ref 11.5–15.5)
WBC: 11.5 10*3/uL — ABNORMAL HIGH (ref 4.0–10.5)
nRBC: 1.1 % — ABNORMAL HIGH (ref 0.0–0.2)

## 2020-01-22 LAB — RAPID URINE DRUG SCREEN, HOSP PERFORMED
Amphetamines: NOT DETECTED
Barbiturates: NOT DETECTED
Benzodiazepines: NOT DETECTED
Cocaine: NOT DETECTED
Opiates: NOT DETECTED
Tetrahydrocannabinol: NOT DETECTED

## 2020-01-22 LAB — AMMONIA: Ammonia: 35 umol/L (ref 9–35)

## 2020-01-22 LAB — BLOOD GAS, VENOUS
FIO2: 36
O2 Saturation: 68.8 %
Patient temperature: 37.2
pCO2, Ven: >120 mmHg (ref 44.0–60.0)
pH, Ven: 7.172 — CL (ref 7.250–7.430)
pO2, Ven: 44.1 mmHg (ref 32.0–45.0)

## 2020-01-22 LAB — POC SARS CORONAVIRUS 2 AG -  ED: SARS Coronavirus 2 Ag: NEGATIVE

## 2020-01-22 LAB — TRIGLYCERIDES: Triglycerides: 149 mg/dL (ref <150)

## 2020-01-22 LAB — ETHANOL: Alcohol, Ethyl (B): <10 mg/dL (ref <10)

## 2020-01-22 LAB — GLUCOSE, CAPILLARY: Glucose-Capillary: 128 mg/dL — ABNORMAL HIGH (ref 70–99)

## 2020-01-22 LAB — CORTISOL: Cortisol, Plasma: 16.5 ug/dL

## 2020-01-22 MED ORDER — ALBUTEROL SULFATE (2.5 MG/3ML) 0.083% IN NEBU: 2.5000 mg | INHALATION_SOLUTION | RESPIRATORY_TRACT | Status: DC | PRN

## 2020-01-22 MED ORDER — METHYLPREDNISOLONE SODIUM SUCC 125 MG IJ SOLR: 40.0000 mg | Freq: Every day | INTRAMUSCULAR | Status: DC

## 2020-01-22 MED ORDER — FENTANYL CITRATE (PF) 100 MCG/2ML IJ SOLN: 50.0000 ug | Freq: Once | INTRAMUSCULAR | Status: DC

## 2020-01-22 MED ORDER — FENTANYL 2500MCG IN NS 250ML (10MCG/ML) PREMIX INFUSION
50.0000 ug/h | INTRAVENOUS | Status: DC
  Administered 2020-01-22: 22:00:00 50 ug/h via INTRAVENOUS
  Filled 2020-01-22: qty 250

## 2020-01-22 MED ORDER — CHLORHEXIDINE GLUCONATE CLOTH 2 % EX PADS: 6.0000 | MEDICATED_PAD | Freq: Every day | CUTANEOUS | Status: DC

## 2020-01-22 MED ORDER — IPRATROPIUM BROMIDE HFA 17 MCG/ACT IN AERS: 2.0000 | INHALATION_SPRAY | Freq: Once | RESPIRATORY_TRACT | Status: DC

## 2020-01-22 MED ORDER — ROCURONIUM BROMIDE 50 MG/5ML IV SOLN
100.0000 mg | Freq: Once | INTRAVENOUS | Status: AC
  Administered 2020-01-22: 16:00:00 100 mg via INTRAVENOUS
  Filled 2020-01-22: qty 10

## 2020-01-22 MED ORDER — IPRATROPIUM-ALBUTEROL 20-100 MCG/ACT IN AERS: 2.0000 | INHALATION_SPRAY | Freq: Once | RESPIRATORY_TRACT | Status: DC

## 2020-01-22 MED ORDER — ALBUTEROL SULFATE HFA 108 (90 BASE) MCG/ACT IN AERS
6.0000 | INHALATION_SPRAY | Freq: Once | RESPIRATORY_TRACT | Status: AC
  Administered 2020-01-22: 11:00:00 6 via RESPIRATORY_TRACT

## 2020-01-22 MED ORDER — FENTANYL BOLUS VIA INFUSION
50.0000 ug | INTRAVENOUS | Status: DC | PRN
  Administered 2020-01-22 – 2020-01-23 (×2): 50 ug via INTRAVENOUS
  Filled 2020-01-22: qty 50

## 2020-01-22 MED ORDER — ETOMIDATE 2 MG/ML IV SOLN
10.0000 mg | Freq: Once | INTRAVENOUS | Status: AC
  Administered 2020-01-22: 16:00:00 10 mg via INTRAVENOUS

## 2020-01-22 MED ORDER — METHYLPREDNISOLONE SODIUM SUCC 125 MG IJ SOLR
125.0000 mg | Freq: Once | INTRAMUSCULAR | Status: AC
  Administered 2020-01-22: 12:00:00 125 mg via INTRAVENOUS
  Filled 2020-01-22: qty 2

## 2020-01-22 MED ORDER — ORAL CARE MOUTH RINSE
15.0000 mL | OROMUCOSAL | Status: DC
  Administered 2020-01-23 (×3): 15 mL via OROMUCOSAL

## 2020-01-22 MED ORDER — IPRATROPIUM-ALBUTEROL 20-100 MCG/ACT IN AERS
1.0000 | INHALATION_SPRAY | Freq: Once | RESPIRATORY_TRACT | Status: DC
  Filled 2020-01-22: qty 4

## 2020-01-22 MED ORDER — PANTOPRAZOLE SODIUM 40 MG IV SOLR
40.0000 mg | Freq: Every day | INTRAVENOUS | Status: DC
  Administered 2020-01-22: 23:00:00 40 mg via INTRAVENOUS
  Filled 2020-01-22: qty 40

## 2020-01-22 MED ORDER — SODIUM CHLORIDE 0.9 % IV SOLN
2.0000 g | Freq: Three times a day (TID) | INTRAVENOUS | Status: DC
  Administered 2020-01-22 – 2020-01-24 (×6): 2 g via INTRAVENOUS
  Filled 2020-01-22 (×6): qty 2

## 2020-01-22 MED ORDER — METRONIDAZOLE IN NACL 5-0.79 MG/ML-% IV SOLN
500.0000 mg | Freq: Once | INTRAVENOUS | Status: AC
  Administered 2020-01-22: 18:00:00 500 mg via INTRAVENOUS
  Filled 2020-01-22: qty 100

## 2020-01-22 MED ORDER — SODIUM CHLORIDE 0.9 % IV SOLN: Freq: Once | INTRAVENOUS | Status: AC

## 2020-01-22 MED ORDER — VANCOMYCIN HCL 2000 MG/400ML IV SOLN
2000.0000 mg | Freq: Once | INTRAVENOUS | Status: AC
  Administered 2020-01-22: 19:00:00 2000 mg via INTRAVENOUS
  Filled 2020-01-22: qty 400

## 2020-01-22 MED ORDER — MIDAZOLAM HCL 2 MG/2ML IJ SOLN
2.0000 mg | Freq: Once | INTRAMUSCULAR | Status: AC
  Administered 2020-01-22: 17:00:00 2 mg via INTRAVENOUS
  Filled 2020-01-22: qty 2

## 2020-01-22 MED ORDER — SODIUM CHLORIDE 0.9 % IV SOLN: INTRAVENOUS | Status: DC

## 2020-01-22 MED ORDER — SODIUM CHLORIDE 0.9 % IV BOLUS
1000.0000 mL | Freq: Once | INTRAVENOUS | Status: AC
  Administered 2020-01-22: 16:00:00 1000 mL via INTRAVENOUS

## 2020-01-22 MED ORDER — SODIUM CHLORIDE 0.9 % IV BOLUS
1300.0000 mL | Freq: Once | INTRAVENOUS | Status: AC
  Administered 2020-01-22: 19:00:00 500 mL via INTRAVENOUS

## 2020-01-22 MED ORDER — IPRATROPIUM-ALBUTEROL 0.5-2.5 (3) MG/3ML IN SOLN
3.0000 mL | Freq: Four times a day (QID) | RESPIRATORY_TRACT | Status: DC
  Administered 2020-01-22: 22:00:00 3 mL via RESPIRATORY_TRACT
  Filled 2020-01-22: qty 3

## 2020-01-22 MED ORDER — VANCOMYCIN HCL IN DEXTROSE 1-5 GM/200ML-% IV SOLN
1000.0000 mg | Freq: Once | INTRAVENOUS | Status: DC
  Filled 2020-01-22: qty 200

## 2020-01-22 MED ORDER — NOREPINEPHRINE 4 MG/250ML-% IV SOLN
INTRAVENOUS | Status: AC
  Filled 2020-01-22: qty 250

## 2020-01-22 MED ORDER — VANCOMYCIN HCL IN DEXTROSE 1-5 GM/200ML-% IV SOLN
1000.0000 mg | Freq: Two times a day (BID) | INTRAVENOUS | Status: DC
  Administered 2020-01-23: 05:00:00 1000 mg via INTRAVENOUS
  Filled 2020-01-22: qty 200

## 2020-01-22 MED ORDER — ALBUTEROL SULFATE HFA 108 (90 BASE) MCG/ACT IN AERS
6.0000 | INHALATION_SPRAY | Freq: Once | RESPIRATORY_TRACT | Status: DC
  Filled 2020-01-22: qty 6.7

## 2020-01-22 MED ORDER — CHLORHEXIDINE GLUCONATE 0.12% ORAL RINSE (MEDLINE KIT)
15.0000 mL | Freq: Two times a day (BID) | OROMUCOSAL | Status: DC
  Administered 2020-01-23 (×2): 15 mL via OROMUCOSAL

## 2020-01-22 MED ORDER — ASPIRIN 300 MG RE SUPP
300.0000 mg | Freq: Once | RECTAL | Status: AC
  Administered 2020-01-22: 18:00:00 300 mg via RECTAL
  Filled 2020-01-22: qty 1

## 2020-01-22 MED ORDER — HEPARIN SODIUM (PORCINE) 5000 UNIT/ML IJ SOLN
5000.0000 [IU] | Freq: Three times a day (TID) | INTRAMUSCULAR | Status: DC
  Administered 2020-01-22 – 2020-01-23 (×2): 5000 [IU] via SUBCUTANEOUS
  Filled 2020-01-22 (×2): qty 1

## 2020-01-22 MED ORDER — AEROCHAMBER PLUS FLO-VU MEDIUM MISC
1.0000 | Freq: Once | Status: AC
  Administered 2020-01-22: 11:00:00 1
  Filled 2020-01-22: qty 1

## 2020-01-22 MED ORDER — PROPOFOL 1000 MG/100ML IV EMUL
0.0000 ug/kg/min | INTRAVENOUS | Status: DC
  Administered 2020-01-22: 23:00:00 50 ug/kg/min via INTRAVENOUS
  Administered 2020-01-22: 17:00:00 5 ug/kg/min via INTRAVENOUS
  Administered 2020-01-23 (×2): 40 ug/kg/min via INTRAVENOUS
  Filled 2020-01-22 (×6): qty 100

## 2020-01-22 MED ORDER — NOREPINEPHRINE 4 MG/250ML-% IV SOLN
0.0000 ug/min | INTRAVENOUS | Status: DC
  Administered 2020-01-22: 16:00:00 2 ug/min via INTRAVENOUS
  Administered 2020-01-23: 7 ug/min via INTRAVENOUS
  Filled 2020-01-22: qty 250

## 2020-01-22 MED ORDER — ETOMIDATE 2 MG/ML IV SOLN: 20.0000 mg | Freq: Once | INTRAVENOUS | Status: DC

## 2020-01-22 NOTE — Progress Notes (Signed)
Pt taken to CT on BIPAP and returned without complications.

## 2020-01-22 NOTE — ED Provider Notes (Signed)
[facility] EMERGENCY DEPARTMENT Provider Note   CSN: [id number] Arrival date & time: 01/22/20  1015     History Chief Complaint  Patient presents with  . Shortness of Breath    Miguel Marsh is a 58 y.o. male.  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  Patient states that shortness of breath has been present for the last several days.  He has had loss of taste and smell for a few days as well. He feels generally weak.  states his symptoms feel similar to prior COPD exacerbations.  There is a level 5 caveat as patient is somewhat somnolent on exam and is not providing a detailed history.  Past Medical History:  Diagnosis Date  . Arthritis   . Bronchitis   . Cancer [facility])    metastatic NSCLC (followed by [facility])  . COPD (chronic obstructive pulmonary disease) ([place])   . HTN (hypertension)     Patient Active Problem List   Diagnosis Date Noted  . Acute respiratory failure ([place]) 01/23/2018  . Acute on chronic respiratory failure with hypoxia and hypercapnia ([place]) 01/23/2018  . Acute on chronic respiratory failure with hypoxia ([place]) 01/01/2018  . Obesity, Class III, BMI 40-49.9 (morbid obesity) ([place]) 01/01/2018  . COPD with exacerbation ([place]) 01/01/2018  . Essential hypertension 12/09/2015  . Non-small cell cancer of right lung ([place]) 12/09/2015  . GERD (gastroesophageal reflux disease) 12/09/2015  . COPD with acute exacerbation ([place]) 12/17/2014  . Acute on chronic respiratory failure ([place]) 12/17/2014  . Tobacco use disorder 12/17/2014  . COPD exacerbation ([place]) 12/17/2014  . Obesity 12/17/2014  . Dyspnea   . Difficulty in walking(719.7) 04/02/2013  . Hip weakness 04/02/2013    Past Surgical History:  Procedure Laterality Date  . LUNG BIOPSY    . TOTAL HIP ARTHROPLASTY    . TUMOR REMOVAL Right 12/04/2017        No family history on file.  Social History   Tobacco Use  . Smoking status: Former Smoker    Packs/day: 0.10    Types: Cigarettes  . Smokeless tobacco: Never Used  Substance Use Topics  . Alcohol use: Yes    Comment: occ  . Drug use: No    Types: Marijuana, Cocaine    Comment: former    Home Medications Prior to Admission medications   Medication Sig Start Date End Date Taking? Authorizing Provider  albuterol (PROVENTIL HFA;VENTOLIN HFA) 108 (90 Base) MCG/ACT inhaler Inhale 2 puffs into the lungs every 6 (six) hours as needed for wheezing. 07/17/18   Roxan Hockey, MD  guaiFENesin (MUCINEX) 600 MG 12 hr tablet Take 1 tablet (600 mg total) by mouth 2 (two) times daily. Patient taking differently: Take 600 mg by mouth 2 (two) times daily as needed for cough or to loosen phlegm.  07/17/18   Roxan Hockey, MD  hydrochlorothiazide (HYDRODIURIL) 12.5 MG tablet Take 1 tablet (12.5 mg total) by mouth daily. Patient taking differently: Take 12.5 mg by mouth daily as needed (for fluid).  07/17/18   Roxan Hockey, MD  ibuprofen (ADVIL) 800 MG tablet Take 800 mg by mouth daily as needed for mild pain or moderate pain.  12/01/19   [provider]  ipratropium-albuterol (DUONEB) 0.5-2.5 (3) MG/3ML SOLN Take 3 mLs by nebulization every 4 (four) hours as needed. 12/16/19   [name], MD  oxyCODONE-acetaminophen (  PERCOCET) 10-325 MG tablet Take 1 tablet by mouth every 6 (six) hours as needed for pain.  12/01/19   [provider]  predniSONE (DELTASONE) 10 MG tablet Take 6 tablets (60 mg total) by mouth daily with breakfast. And decrease by one tablet daily 12/17/19   Tat, Shanon Brow, MD  TRELEGY ELLIPTA 100-62.5-25 MCG/INH AEPB Take 1 puff by mouth daily. 10/27/19   [provider]    Allergies    Patient has no known allergies.  Review of Systems   Review of Systems  Unable to perform ROS: Mental status change  HENT:       Loss of taste and smell   Respiratory: Positive for cough and shortness of breath.   Neurological: Positive for weakness.    Physical Exam Updated Vital Signs BP (!) 153/86   Pulse 78   Temp (!) 95.4 F (35.2 C)   Resp 20   Ht 5\' 6"  (1.676 m)   Wt 111 kg   SpO2 92%   BMI 39.50 kg/m   Physical Exam Vitals and nursing note reviewed.  Constitutional:      Appearance: He is well-developed.  HENT:     Head: Normocephalic and atraumatic.  Eyes:     Conjunctiva/sclera: Conjunctivae normal.  Cardiovascular:     Rate and Rhythm: Regular rhythm.     Heart sounds: No murmur.     Comments: Marginally tachycardic at 101 Pulmonary:     Effort: Pulmonary effort is normal. No respiratory distress.     Comments: Coarse breath sounds bilaterally Abdominal:     Palpations: Abdomen is soft.     Tenderness: There is no abdominal tenderness.  Musculoskeletal:     Cervical back: Neck supple.     Right lower leg: No tenderness.     Left lower leg: No tenderness.     Comments: Trace ble edema  Skin:    General: Skin is warm and dry.  Neurological:     Mental Status: He is alert.     Comments: Somnolent but arousable to voice and stimuli. Follows simple commands. PERRL. Moves all extremities with normal coordination. Normal sensation throughout. No facial droop.      ED Results / Procedures / Treatments   Labs (all labs ordered are listed, but only abnormal results are displayed) Labs Reviewed  CBC WITH DIFFERENTIAL/PLATELET - Abnormal; Notable for the following components:      Result Value   WBC 11.5 (*)    HCT 56.0 (*)    MCHC 28.9 (*)    nRBC 1.1 (*)    Neutro Abs 7.8 (*)    Monocytes Absolute 1.6 (*)    Abs Immature Granulocytes 0.09 (*)    All other components within normal limits  COMPREHENSIVE METABOLIC PANEL - Abnormal; Notable for the following components:   Chloride 87 (*)    CO2 44 (*)    Glucose, Bld 100 (*)    BUN 28 (*)    ALT 49 (*)    All other components within normal limits   BLOOD GAS, VENOUS - Abnormal; Notable for the following components:   pH, Ven 7.172 (*)    pCO2, Ven >120.0 (*)    All other components within normal limits  TROPONIN I (HIGH SENSITIVITY) - Abnormal; Notable for the following components:   Troponin I (High Sensitivity) 88 (*)    All other components within normal limits  TROPONIN I (HIGH SENSITIVITY) - Abnormal; Notable for the following components:   Troponin I (High  Sensitivity) 82 (*)    All other components within normal limits  RESPIRATORY PANEL BY RT PCR (FLU A&B, COVID)  CULTURE, BLOOD (ROUTINE X 2)  CULTURE, BLOOD (ROUTINE X 2)  URINE CULTURE  AMMONIA  RAPID URINE DRUG SCREEN, HOSP PERFORMED  ETHANOL  URINALYSIS, ROUTINE W REFLEX MICROSCOPIC  LACTIC ACID, PLASMA  LACTIC ACID, PLASMA  BLOOD GAS, ARTERIAL  TRIGLYCERIDES  POC SARS CORONAVIRUS 2 AG -  ED    EKG EKG Interpretation  Date/Time:  Thursday January 22 2020 10:44:10 EDT Ventricular Rate:  101 PR Interval:    QRS Duration: 74 QT Interval:  330 QTC Calculation: 428 R Axis:   -92 Text Interpretation: Sinus tachycardia Ventricular premature complex Left anterior fascicular block Poor baseline T wave changes lateral Confirmed by Elnora Morrison [phone]) on 01/22/2020 10:54:12 AM   Radiology CT Head Wo Contrast  Result Date: 01/22/2020 CLINICAL DATA:  Generalized weakness with shortness of breath. Patient unable to give history. EXAM: CT HEAD WITHOUT CONTRAST TECHNIQUE: Contiguous axial images were obtained from the base of the skull through the vertex without intravenous contrast. COMPARISON:  None. FINDINGS: Brain: Ventricles, cisterns and other CSF spaces are within normal. There is no mass, mass effect, shift of midline structures or acute hemorrhage. No evidence of acute infarction. Vascular: No hyperdense vessel or unexpected calcification. Skull: Normal. Negative for fracture or focal lesion. Sinuses/Orbits: No acute finding. Other: None. IMPRESSION: No acute  findings. Electronically Signed   By: Marin Olp M.D.   On: 01/22/2020 14:58   DG Chest Portable 1 View  Result Date: 01/22/2020 CLINICAL DATA:  Tube placement.  Possible sepsis. EXAM: PORTABLE CHEST 1 VIEW COMPARISON:  Radiograph earlier this day. Chest CT 07/16/2018 FINDINGS: Endotracheal tube tip is age 31 cm from the carina. Enteric tube in place with tip below the diaphragm not included in the field of view. Unchanged heart size and mediastinal contours. The lungs are hyperinflated with diffuse bronchial thickening. Blunting of the costophrenic angles may be due to effusion versus hyperinflation. Surgical clips project over the left suprahilar region. Minor left basilar atelectasis/scarring. No confluent airspace disease, pneumothorax, or pulmonary edema. IMPRESSION: 1. Endotracheal tube tip 4 cm from the carina. Enteric tube in place with tip below the diaphragm not included in the field of view. 2. Hyperinflation and bronchial thickening. Atelectasis/scarring at the left lung base. Electronically Signed   By: Kanin Rake M.D.   On: 01/22/2020 16:48   DG Chest Portable 1 View  Result Date: 01/22/2020 CLINICAL DATA:  Of shortness of breath EXAM: PORTABLE CHEST 1 VIEW COMPARISON:  December 11, 2019 FINDINGS: The mediastinal contour and cardiac silhouette are is normal. Mild atelectasis of left lung base is noted. There is no focal pneumonia, pulmonary edema, or pleural effusion. The bony structures are stable. IMPRESSION: Mild atelectasis of left lung base. Otherwise no acute abnormality. Mom Electronically Signed   By: Abelardo Diesel M.D.   On: 01/22/2020 11:28    Procedures Procedure Name: Intubation Date/Time: 01/22/2020 4:50 PM Performed by: Rodney Booze, PA-C Pre-anesthesia Checklist: Patient identified, Emergency Drugs available, Patient being monitored and Timeout performed Preoxygenation: Pre-oxygenation with 100% oxygen (pt being preoxygenated on bipap) Induction Type: Rapid  sequence Ventilation: Mask ventilation without difficulty Laryngoscope Size: 4 and Glidescope Tube type: Subglottic suction tube Tube size: 7.5 mm Number of attempts: 1 Airway Equipment and Method: Video-laryngoscopy Placement Confirmation: ETT inserted through vocal cords under direct vision,  Positive ETCO2,  CO2 detector and Breath sounds checked- equal and  bilateral Secured at: 28 cm Tube secured with: ETT holder Dental Injury: Teeth and Oropharynx as per pre-operative assessment       (including critical care time)  5:03 PM Cardiac monitoring reveals Sinus tach HR 100 (Rate & rhythm), as reviewed and interpreted by me. Cardiac monitoring was ordered due to tachycardia, sob, and to monitor patient for dysrhythmia.  CRITICAL CARE Performed by: Rodney Booze   Total critical care time: 43 minutes  Critical care time was exclusive of separately billable procedures and treating other patients.  Critical care was necessary to treat or prevent imminent or life-threatening deterioration.  Critical care was time spent personally by me on the following activities: development of treatment plan with patient and/or surrogate as well as nursing, discussions with consultants, evaluation of patient's response to treatment, examination of patient, obtaining history from patient or surrogate, ordering and performing treatments and interventions, ordering and review of laboratory studies, ordering and review of radiographic studies, pulse oximetry and re-evaluation of patient's condition.   Medications Ordered in ED Medications  Ipratropium-Albuterol (COMBIVENT) respimat 1 puff (1 puff Inhalation Not Given 01/22/20 1412)  aspirin suppository 300 mg (has no administration in time range)  norepinephrine (LEVOPHED) 4mg  in 217mL premix infusion (3 mcg/min Intravenous Rate/Dose Change 01/22/20 1627)  propofol (DIPRIVAN) 1000 MG/100ML infusion (5 mcg/kg/min  111 kg Intravenous New Bag/Given  01/22/20 1659)  metroNIDAZOLE (FLAGYL) IVPB 500 mg (has no administration in time range)  vancomycin (VANCOREADY) IVPB 2000 mg/400 mL (has no administration in time range)  ceFEPIme (MAXIPIME) 2 g in sodium chloride 0.9 % 100 mL IVPB (has no administration in time range)  vancomycin (VANCOCIN) IVPB 1000 mg/200 mL premix (has no administration in time range)  methylPREDNISolone sodium succinate (SOLU-MEDROL) 125 mg/2 mL injection 125 mg (125 mg Intravenous Given 01/22/20 1226)  AeroChamber Plus Flo-Vu Medium MISC 1 each (1 each Other Given 01/22/20 1128)  albuterol (VENTOLIN HFA) 108 (90 Base) MCG/ACT inhaler 6 puff (6 puffs Inhalation Given 01/22/20 1128)  rocuronium (ZEMURON) injection 100 mg (100 mg Intravenous Given 01/22/20 1553)  0.9 %  sodium chloride infusion ( Intravenous Stopped 01/22/20 1613)  etomidate (AMIDATE) injection 10 mg (10 mg Intravenous Given 01/22/20 1552)  sodium chloride 0.9 % bolus 1,000 mL (1,000 mLs Intravenous New Bag/Given 01/22/20 1614)    ED Course  I have reviewed the triage vital signs and the nursing notes.  Pertinent labs & imaging results that were available during my care of the patient were reviewed by me and considered in my medical decision making (see chart for details).    MDM Rules/Calculators/A&P                      59 year old male with a history of COPD, metastatic lung cancer, on chronic oxygen therapy, presenting with shortness of breath.  Reviewed/interpreted lab CBC with leukocytosis at 11.5, no anemia present.  Patient with left shift. CMP with elevated CO2 at 44, elevated BUN at 28.  Normal kidney function.  ALT slightly elevated at 49, otherwise reassuring. Troponin is elevated to 88, delta troponin trending down VBG with acidemia, pH 7.172.  PCO2 is also significantly elevated at greater than 120.  -Nursing contacted me to report this.  I was in a procedure and advised to contact Dr. Reather Converse to initiate BiPAP.  He placed this order and  evaluated the patient. Ammonia negative EtOH negative UDS negative Covid/influenza are negative UA negative for UTI  EKG with sinus tachycardia Ventricular premature complex  Left anterior fascicular block Poor baseline T wave changes lateral  CXR reviewed/interpeted - agree with radiologist, mild left base atelectasis, otherwise reassuring  CT headreviewed/interpeted - no evidence of acute bleeding or other acute abnormality  Pt was trialed on BiPAP. Initially he was breathing over BiPAP. On reassessment however he was hypoxic at 86% and his somnolence has increased. Attempted to call family multiple times to determine code status and goals of care. I was unable to contact family. His last d/c note last month indicated that he was full code. He is unable to voice his code status as this time and will require intubation.  Post intubation CXR reviewed - ET tube in good position..   Pt will be admitted for hypercapnic respiratory failure.  4:45 PM CONSULT with Dr. Lamonte Sakai with critical care. He states that there are currently no beds but that he is willing to admit to ICU or consult on the patient and have him stay at [facility]. Advised to discussed with hospitalist service at [facility].   At shift change, care transitioned to Dr. Alvino Chapel to f/u on pending admission.   Final Clinical Impression(s) / ED Diagnoses Final diagnoses:  None    Rx / DC Orders ED Discharge Orders    None       Bishop Dublin 01/22/20 1703    Elnora Morrison, MD 01/24/20 1540

## 2020-01-22 NOTE — ED Notes (Signed)
Radiology called and notified of chest xray order for tube placement.

## 2020-01-22 NOTE — ED Notes (Signed)
Spoke with Marden Noble at [facility] will send for transportation after 1900.

## 2020-01-22 NOTE — ED Notes (Signed)
Dr Reather Converse at bedside. Orders received to increase Norepinephrine to 10 mcg/min.

## 2020-01-22 NOTE — Progress Notes (Signed)
Called to admit patient for respiratory failure 2/2 COPD exacerbation, intubated in the ED on Ventilator and on pressors. Critical care called to admit but no beds available at [facility], initially recommended hospitalist admission at [facility] pending bed availability. Admission Orders placed but I was called back that bed now available at [facility]. Care taken over by PCCM. Patient will be admitted to [facility].  Called patients brother Kijana Cromie, informing him of patients status and admission to [facility].  Bing Neighbors, MD [facility].  LOS- No Charge.

## 2020-01-22 NOTE — ED Triage Notes (Signed)
C/o SOB, loss of taste and smell for couple of days. Generalized weakness. Lives at home alone denies exposure to COVID and uses 2L O2 at home continuous.

## 2020-01-22 NOTE — H&P (Deleted)
History and Physical    SONU KRUCKENBERG OXB:[id number] DOB: 1961-05-05 DOA: 01/22/2020  PCP: Scotty Court, DO   Patient coming from: Home  I have personally briefly reviewed patient's old medical records in [place]  Chief Complaint: SOB  HPI: Miguel Marsh is a 59 y.o. male with medical history significant for non-small cell lung cancer with metastasis, hypertension, COPD with chronic respiratory failure on 2 L O2.  Presented to the ED with complaints of difficulty breathing over several days, with complaints of loss of taste and smell. At the time of my evaluation, patient is intubated. History is obtained from chart review.  I talked to patient's brother Bulmaro Feagans, his brother Aul told him that he was going to the doctor today because he is having difficulty breathing.  He is unaware of any other complaints or symptoms, he does not live with patient.  As far as he is aware he his brother would want to be to be intubated/resuscitated.  Recent hospitalization 2/11-2/16 for acute on chronic respiratory failure secondary to COPD exacerbation.  Required 3 L oxygen supplementation.  ED Course: Somnolent in the ED.  T-max 99, initial blood pressure 141 dropped after intubation to systolic 82 requiring pressors with norepinephrine .  Respiratory virus panel negative for COVID-19 infection and influenza.  Initially placed on BiPAP, but patient was hypoxic at 86 percent on BiPAP, with increasing somnolence.  Venous blood gas also showed a pH of 7.1, PCO2 greater than 120.  Chest x-ray showed mild left base atelectasis.  WBC 11.5.  Troponin 88 >> 82. EDP talked to PCCM, no beds available at [facility] recommended admission to hospitalist service at [facility], PCCM will consult. Blood cultures obtained, started on IV vancomycin, cefepime.  Hospitalist to admit for acute on chronic respiratory failure.  Review of Systems: Unable to ascertain, patient intubated and  sedated.  Past Medical History:  Diagnosis Date  . Arthritis   . Bronchitis   . Cancer [facility])    metastatic NSCLC (followed by [facility])  . COPD (chronic obstructive pulmonary disease) ([place])   . HTN (hypertension)     Past Surgical History:  Procedure Laterality Date  . LUNG BIOPSY    . TOTAL HIP ARTHROPLASTY    . TUMOR REMOVAL Right 12/04/2017     reports that he has quit smoking. His smoking use included cigarettes. He smoked 0.10 packs per day. He has never used smokeless tobacco. He reports current alcohol use. He reports that he does not use drugs.  No Known Allergies  Family history of hypertension.   Prior to Admission medications   Medication Sig Start Date End Date Taking? Authorizing Provider  albuterol (PROVENTIL HFA;VENTOLIN HFA) 108 (90 Base) MCG/ACT inhaler Inhale 2 puffs into the lungs every 6 (six) hours as needed for wheezing. 07/17/18   Roxan Hockey, MD  guaiFENesin (MUCINEX) 600 MG 12 hr tablet Take 1 tablet (600 mg total) by mouth 2 (two) times daily. Patient taking differently: Take 600 mg by mouth 2 (two) times daily as needed for cough or to loosen phlegm.  07/17/18   Roxan Hockey, MD  hydrochlorothiazide (HYDRODIURIL) 12.5 MG tablet Take 1 tablet (12.5 mg total) by mouth daily. Patient taking differently: Take 12.5 mg by mouth daily as needed (for fluid).  07/17/18   Roxan Hockey, MD  ibuprofen (ADVIL) 800 MG tablet Take 800 mg by mouth daily as needed for mild pain or moderate pain.  12/01/19   [provider]  ipratropium-albuterol (DUONEB) 0.5-2.5 (3) MG/3ML SOLN Take 3 mLs by nebulization every 4 (four) hours as needed. 12/16/19   [name], MD  oxyCODONE-acetaminophen (PERCOCET) 10-325 MG tablet Take 1 tablet by mouth every 6 (six) hours as needed for pain.  12/01/19   [provider]  predniSONE (DELTASONE) 10 MG tablet Take 6 tablets (60 mg total) by mouth daily with breakfast. And decrease by one tablet daily 12/17/19   Tat, Shanon Brow, MD   TRELEGY ELLIPTA 100-62.5-25 MCG/INH AEPB Take 1 puff by mouth daily. 10/27/19   [provider]    Physical Exam: Vitals:   01/22/20 1620 01/22/20 1625 01/22/20 1630 01/22/20 1645  BP: (!) 170/92 (!) 175/92 (!) 166/90 (!) 153/86  Pulse: 75 78 76 78  Resp: 20 19 18 20   Temp: (!) 94.8 F (34.9 C) (!) 95 F (35 C) (!) 95 F (35 C) (!) 95.4 F (35.2 C)  TempSrc:      SpO2: 99% 95% 95% 92%  Weight:      Height:        Constitutional: Intubated Vitals:   01/22/20 1620 01/22/20 1625 01/22/20 1630 01/22/20 1645  BP: (!) 170/92 (!) 175/92 (!) 166/90 (!) 153/86  Pulse: 75 78 76 78  Resp: 20 19 18 20   Temp: (!) 94.8 F (34.9 C) (!) 95 F (35 C) (!) 95 F (35 C) (!) 95.4 F (35.2 C)  TempSrc:      SpO2: 99% 95% 95% 92%  Weight:      Height:       Eyes: PERRL ENMT: Intubated Neck: normal,no thyromegaly Respiratory: Intubated on vent, equal chest rise.  Cardiovascular: Regular rate and rhythm, No extremity edema. 2+ pedal pulses. No carotid bruits.  Abdomen: no tenderness, no masses palpated. No hepatosplenomegaly. Bowel sounds positive.  Musculoskeletal: no clubbing / cyanosis. No joint deformity upper and lower extremities. Good ROM, no contractures. Normal muscle tone.  Skin: no rashes, lesions, ulcers. No induration Neurologic: CN 2-12 grossly intact. Sensation intact, DTR normal. Strength 5/5 in all 4.  Psychiatric: Normal judgment and insight. Alert and oriented x 3. Normal mood.   Labs on Admission: I have personally reviewed following labs and imaging studies  CBC: Recent Labs  Lab 01/22/20 1209  WBC 11.5*  NEUTROABS 7.8*  HGB 16.2  HCT 56.0*  MCV 98.6  PLT 270   Basic Metabolic Panel: Recent Labs  Lab 01/22/20 1209  NA 139  K 4.3  CL 87*  CO2 44*  GLUCOSE 100*  BUN 28*  CREATININE 0.90  CALCIUM 8.9   Liver Function Tests: Recent Labs  Lab 01/22/20 1209  AST 29  ALT 49*  ALKPHOS 65  BILITOT 0.8  PROT 7.1  ALBUMIN 3.7    Recent  Labs  Lab 01/22/20 1209  AMMONIA 35   Urine analysis:    Component Value Date/Time   COLORURINE YELLOW 01/22/2020 1251   APPEARANCEUR CLEAR 01/22/2020 1251   LABSPEC 1.017 01/22/2020 1251   PHURINE 6.0 01/22/2020 1251   GLUCOSEU NEGATIVE 01/22/2020 1251   [place] 01/22/2020 1251   [place] 01/22/2020 1251   KETONESUR NEGATIVE 01/22/2020 1251   PROTEINUR NEGATIVE 01/22/2020 1251   UROBILINOGEN 0.2 10/21/2013 0200   NITRITE NEGATIVE 01/22/2020 1251   LEUKOCYTESUR NEGATIVE 01/22/2020 1251    Radiological Exams on Admission: CT Head Wo Contrast  Result Date: 01/22/2020 CLINICAL DATA:  Generalized weakness with shortness of breath. Patient unable to give history. EXAM: CT HEAD WITHOUT CONTRAST TECHNIQUE: Contiguous axial images  were obtained from the base of the skull through the vertex without intravenous contrast. COMPARISON:  None. FINDINGS: Brain: Ventricles, cisterns and other CSF spaces are within normal. There is no mass, mass effect, shift of midline structures or acute hemorrhage. No evidence of acute infarction. Vascular: No hyperdense vessel or unexpected calcification. Skull: Normal. Negative for fracture or focal lesion. Sinuses/Orbits: No acute finding. Other: None. IMPRESSION: No acute findings. Electronically Signed   By: Marin Olp M.D.   On: 01/22/2020 14:58   DG Chest Portable 1 View  Result Date: 01/22/2020 CLINICAL DATA:  Tube placement.  Possible sepsis. EXAM: PORTABLE CHEST 1 VIEW COMPARISON:  Radiograph earlier this day. Chest CT 07/16/2018 FINDINGS: Endotracheal tube tip is age 43 cm from the carina. Enteric tube in place with tip below the diaphragm not included in the field of view. Unchanged heart size and mediastinal contours. The lungs are hyperinflated with diffuse bronchial thickening. Blunting of the costophrenic angles may be due to effusion versus hyperinflation. Surgical clips project over the left suprahilar region. Minor left basilar  atelectasis/scarring. No confluent airspace disease, pneumothorax, or pulmonary edema. IMPRESSION: 1. Endotracheal tube tip 4 cm from the carina. Enteric tube in place with tip below the diaphragm not included in the field of view. 2. Hyperinflation and bronchial thickening. Atelectasis/scarring at the left lung base. Electronically Signed   By: Jassiah Rake M.D.   On: 01/22/2020 16:48   DG Chest Portable 1 View  Result Date: 01/22/2020 CLINICAL DATA:  Of shortness of breath EXAM: PORTABLE CHEST 1 VIEW COMPARISON:  December 11, 2019 FINDINGS: The mediastinal contour and cardiac silhouette are is normal. Mild atelectasis of left lung base is noted. There is no focal pneumonia, pulmonary edema, or pleural effusion. The bony structures are stable. IMPRESSION: Mild atelectasis of left lung base. Otherwise no acute abnormality. Mom Electronically Signed   By: Abelardo Diesel M.D.   On: 01/22/2020 11:28    EKG: Independently reviewed.  Sinus tachycardia rate 101, QTc 428.  T wave changes to lead V3, V4, V5.  LAFB.  Assessment/Plan Principal Problem:   Acute on chronic respiratory failure with hypoxia and hypercapnia (HCC) Active Problems:   COPD with acute exacerbation (HCC)   Tobacco use disorder   Essential hypertension   Non-small cell cancer of right lung (HCC)  Acute on chronic respiratory failure with hypoxia and hypercapnia- intubated on ventilator.  Intubation ABG showing pH of 7.2, PCO2 96.  Chest x-ray showing mild left base atelectasis.  Likely secondary to COPD exacerbation.  At baseline on 2 L O2.  Respiratory virus panel negative for COVID-19 and influenza. - EDP talked to critical care- Dr. Lamonte Sakai, no beds available at [facility] recommended admission at [facility], to transfer to [facility] when beds available. -Continue propofol for sedation - PRN fentanyl -Mechanical ventilation protocol  -IV famotidine -Chest x-ray, ABG a.m. - CBC, BMP a.m. - Foley -N.p.o.  Acute COPD  exacerbation-mechanically intubated.  - 125 mg IV Solu-Medrol given, continue 60 every 12 hourly  - IV Vanco and cefepime started in the ED, continue with IV cefepime for COPD  Metabolic encephalopathy-likely respiratory acidosis from acute respiratory failure.  With PCO2 of 96 and pH of 7.2.  T-max 99.  Hypotensive after intubation requiring norepinephrine.  Chest x-ray atelectasis.  UA unremarkable.  Head CT unremarkable. -Required pressors, wean off - 3.3 L sepsis fluid bolus given, cont N/s 75cc/hr -Continue IV cefepime  Non-small cell lung cancer, metastatic- in remission. FollowsMed Onc at [facility].  Diagnosed 06/11/2015.  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 2/12/21hemoglobin A1c--6.4 -Will start SSI Q4H while on steroids  Morbid obesity,Tobacco abuse.  DVT prophylaxis: Lovenox Code Status: Full code- confirmed with  Family Communication: Talked with patient's brother Kaiser Belluomini who is patient's primary decision maker-lives in [place].  Patient has a son who lives in [place], [place]. Disposition Plan: > 2 days Consults called: Critical care.  Admission status: Inpatient, ICU. I certify that at the point of admission it is my clinical judgment that the patient will require inpatient hospital care spanning beyond 2 midnights from the point of admission due to high intensity of service, high risk for further deterioration and high frequency of surveillance required. The following factors support the patient status of inpatient:    Bethena Roys MD [facility]  01/22/2020, 6:22 PM

## 2020-01-22 NOTE — Progress Notes (Signed)
Notified provider and bedside nurse of need to order fluid bolus.  Messaged provider asking for additional fluids to be ordered since pt has not had full suggested amount based on current documented weight and levo has already been started.

## 2020-01-22 NOTE — ED Notes (Signed)
Radiology at bedside

## 2020-01-22 NOTE — H&P (Addendum)
NAME:  Miguel Marsh, MRN:  [id number], DOB:  1961/08/06, LOS: 0 ADMISSION DATE:  01/22/2020, CONSULTATION DATE:  01/22/2020 REFERRING MD:  Dr. Denton Brick, CHIEF COMPLAINT:  Respiratory failure    Brief History   52 yoM presenting with shortness of breath found to be in acute hypercarbic respiratory failure.  Failed BiPAP requiring intubation.  Post intubation, hypotensive requiring vasopressor support.   History of present illness   HPI obtained from medical chart review as patient is sedated and intubated on mechanical ventilation.    59 year old male with prior hx of non-small cell right lung cancer with metastases (posterior mediastinal nerve sheath tumor resected 12/04/17) in remission (followed at [facility]), COPD on home O2 2L, and HTN presenting from home with complaints of shortness of breath, generalized weakness, and loss of taste and smell for several days.  Lives alone and denied any known COVID exposures.  Patient stated his symptoms felt like his prior COPD exacerbations.  Unclear if change in cough.     In ER, patient very somnolent but non focal. Temp 95.4, with normotensive, sinus rhythm and saturations 92% on his 2L nasal cannula.  Labs noted for WBC 11.5, Cl 87, CO2 44, BUN 28, trop HS 88- >82, lactic acid 0.8, UA negative, CXR showed mild atelectasis of left lung base.  CT head was normal.  ABG showed severe respiratory acidosis with PH 7.17/ CO2 > 120.  After COVID test was negative, patient placed on BiPAP however, poor improvement requiring intubation for airway protection.  Post intubation, patient hypotensive requiring peripheral vasopressor support.  Patient to be transferred to [facility] for higher level of care, PCCM to accept.    Past Medical History  Arthritis  Bronchitis  Metastatic NSCLC  COPD HTN  Smoker   Significant Hospital Events   3/25 presented to [facility]-> tx [facility]  Consults:    Procedures:  3/25 ETT >>  Significant Diagnostic Tests:  3/25 CTH >>  negative  Micro Data:  3/25 SARS 2/ Flu A/B >> neg 3/25 BCx 2 >> 3/25 UC >> 3/25 Sputum >>   Antimicrobials:  3/25 vanc >> 3/25 cefepime >>  Interim history/subjective:  On 60 propofol but remains agitated.  Objective   Blood pressure 105/67, pulse 66, temperature (!) 96.4 F (35.8 C), resp. rate (!) 21, height 5\' 6"  (1.676 m), weight 111 kg, SpO2 95 %.    Vent Mode: PRVC FiO2 (%):  [40 %-100 %] 40 % Set Rate:  [20 bmp] 20 bmp Vt Set:  [510 mL] 510 mL PEEP:  [5 cmH20] 5 cmH20 Plateau Pressure:  [26 cmH20] 26 cmH20   Intake/Output Summary (Last 24 hours) at 01/22/2020 1811 Last data filed at 01/22/2020 1753 Gross per 24 hour  Intake 2000 ml  Output --  Net 2000 ml   Filed Weights   01/22/20 1036  Weight: 111 kg    Examination: General: Adult male, resting in bed, in NAD. Neuro: Sedated but remains agitated, biting at tube.  MAE's. HEENT: [place]/AT. Sclerae anicteric. ETT in place. Cardiovascular: RRR, no M/R/G.  Lungs: Respirations even and unlabored.  Coarse in bases with faint expiratory wheeze. Abdomen: BS x 4, soft, NT/ND.  Musculoskeletal: No gross deformities, no edema.  Skin: Intact, warm, no rashes.  Assessment & Plan:   Acute on chronic Hypoxic and Hypercapnic Respiratory Failure - 2/2 AECOPD Tobacco dependence P: Continue ventilator support SBT as able Bronchial hygiene Continue BD's, steroids Follow CXR Tobacco cessation counseling  Non-small cell lung cancer, metastatic  -  Patient received 2 years of immunotherapy at [facility] -Per chart review last follow up with [facility] oncology 09/2019 Chest CT remains stable with no recurrence of cancer  P: Supportive care  Follow up as outpatient  Hypotension in the setting of sedation  Hx of hypertension  P: Levophed for MAP goal > 65 Hold home antihypertensives Monitor in the ICU setting  Best practice:  Diet: NPO. Pain/Anxiety/Delirium protocol (if indicated): Propofol gtt / fentanyl gtt.  RASS goal  -1. VAP protocol (if indicated): In place. DVT prophylaxis: Heparin.  GI prophylaxis: PPI. Glucose control: SSI. Mobility: Bedrest. Code Status: Full. Family Communication: None. Disposition: ICU.  Labs   CBC: Recent Labs  Lab 01/22/20 1209  WBC 11.5*  NEUTROABS 7.8*  HGB 16.2  HCT 56.0*  MCV 98.6  PLT 371    Basic Metabolic Panel: Recent Labs  Lab 01/22/20 1209  NA 139  K 4.3  CL 87*  CO2 44*  GLUCOSE 100*  BUN 28*  CREATININE 0.90  CALCIUM 8.9   GFR: Estimated Creatinine Clearance: 104.7 mL/min (by C-G formula based on SCr of 0.9 mg/dL). Recent Labs  Lab 01/22/20 1209 01/22/20 1432  WBC 11.5*  --   LATICACIDVEN  --  0.8    Liver Function Tests: Recent Labs  Lab 01/22/20 1209  AST 29  ALT 49*  ALKPHOS 65  BILITOT 0.8  PROT 7.1  ALBUMIN 3.7   No results for input(s): LIPASE, AMYLASE in the last 168 hours. Recent Labs  Lab 01/22/20 1209  AMMONIA 35    ABG    Component Value Date/Time   PHART 7.281 (L) 01/22/2020 1650   PCO2ART 96.7 (HH) 01/22/2020 1650   PO2ART 64.6 (L) 01/22/2020 1650   HCO3 35.7 (H) 01/22/2020 1650   TCO2 21.7 01/15/2008 0911   O2SAT 91.0 01/22/2020 1650     Coagulation Profile: No results for input(s): INR, PROTIME in the last 168 hours.  Cardiac Enzymes: No results for input(s): CKTOTAL, CKMB, CKMBINDEX, TROPONINI in the last 168 hours.  HbA1C: Hgb A1c MFr Bld  Date/Time Value Ref Range Status  12/13/2019 06:26 AM 6.4 (H) 4.8 - 5.6 % Final    Comment:    (NOTE) Pre diabetes:          5.7%-6.4% Diabetes:              >6.4% Glycemic control for   <7.0% adults with diabetes   03/21/2018 05:28 AM 6.6 (H) 4.8 - 5.6 % Final    Comment:    (NOTE) Pre diabetes:          5.7%-6.4% Diabetes:              >6.4% Glycemic control for   <7.0% adults with diabetes     CBG: No results for input(s): GLUCAP in the last 168 hours.  Review of Systems:   Unable to obtain as patient is sedated on ventilator    Past Medical History  He,  has a past medical history of Arthritis, Bronchitis, Cancer ([place]), COPD (chronic obstructive pulmonary disease) ([place]), and HTN (hypertension).   Surgical History    Past Surgical History:  Procedure Laterality Date   LUNG BIOPSY     TOTAL HIP ARTHROPLASTY     TUMOR REMOVAL Right 12/04/2017     Social History   reports that he has quit smoking. His smoking use included cigarettes. He smoked 0.10 packs per day. He has never used smokeless tobacco. He reports current alcohol use. He reports that he  does not use drugs.   Family History   His family history is not on file.   Allergies No Known Allergies   Home Medications  Prior to Admission medications   Medication Sig Start Date End Date Taking? Authorizing Provider  albuterol (PROVENTIL HFA;VENTOLIN HFA) 108 (90 Base) MCG/ACT inhaler Inhale 2 puffs into the lungs every 6 (six) hours as needed for wheezing. 07/17/18   Roxan Hockey, MD  guaiFENesin (MUCINEX) 600 MG 12 hr tablet Take 1 tablet (600 mg total) by mouth 2 (two) times daily. Patient taking differently: Take 600 mg by mouth 2 (two) times daily as needed for cough or to loosen phlegm.  07/17/18   Roxan Hockey, MD  hydrochlorothiazide (HYDRODIURIL) 12.5 MG tablet Take 1 tablet (12.5 mg total) by mouth daily. Patient taking differently: Take 12.5 mg by mouth daily as needed (for fluid).  07/17/18   Roxan Hockey, MD  ibuprofen (ADVIL) 800 MG tablet Take 800 mg by mouth daily as needed for mild pain or moderate pain.  12/01/19   [provider]  ipratropium-albuterol (DUONEB) 0.5-2.5 (3) MG/3ML SOLN Take 3 mLs by nebulization every 4 (four) hours as needed. 12/16/19   [name], MD  oxyCODONE-acetaminophen (PERCOCET) 10-325 MG tablet Take 1 tablet by mouth every 6 (six) hours as needed for pain.  12/01/19   [provider]  predniSONE (DELTASONE) 10 MG tablet Take 6 tablets (60 mg total) by mouth daily with breakfast. And decrease  by one tablet daily 12/17/19   Tat, Shanon Brow, MD  TRELEGY ELLIPTA 100-62.5-25 MCG/INH AEPB Take 1 puff by mouth daily. 10/27/19   [provider]     Critical care time: 45 min.    Montey Hora, [place] [facility] 01/22/2020, 9:45 PM  Patient seen examined chart reviewed, critical care plan discussed with Mr Shearon Stalls.  I agree with above assessment and plan.

## 2020-01-22 NOTE — ED Notes (Signed)
Pt intubated with size 7.5 ET tube, + color change on CO2 detector, bilateral breath sounds noted. Tip to Lip 28 cm

## 2020-01-22 NOTE — ED Notes (Signed)
Dr Zavitz at bedside  

## 2020-01-22 NOTE — ED Provider Notes (Signed)
  Physical Exam  BP 119/73   Pulse 64   Temp (!) 96.6 F (35.9 C)   Resp 19   Ht 5\' 6"  (1.676 m)   Wt 111 kg   SpO2 92%   BMI 39.50 kg/m   Physical Exam  ED Course/Procedures     Procedures  MDM  Patient has been discussed with ICU, stated no available the [facility] and thought to be none today.  Recommend admission to the hospital here at [facility].  Discussed with hospitalist who put admission orders but apparently a bed became available.  Intensivist would like patient on account.  Admit order put in for that.       Davonna Belling, MD 01/22/20 [phone]

## 2020-01-22 NOTE — ED Notes (Signed)
Date and time results received: 01/22/20 1249 (use smartphrase ".now" to insert current time)  Test: PH/PCO2 Critical Value: PH 7.172 PCO2 greater than 120 Name of Provider Notified: Dr Reather Converse Orders Received? Or Actions Taken?:NA

## 2020-01-22 NOTE — ED Notes (Signed)
Respiratory called and informed order for Bipap, Rapid covid test pending. Per respiratory, will await results of rapid covid.

## 2020-01-22 NOTE — ED Notes (Addendum)
Date and time results received: 03/25/211714 (use smartphrase ".now" to insert current time)  Test: PCO2 Critical Value: 96.7  Name of Provider Notified: Dr Alvino Chapel Orders Received? Or Actions Taken?: NA

## 2020-01-22 NOTE — ED Notes (Signed)
Pt transferred to RM 1

## 2020-01-22 NOTE — ED Notes (Signed)
Respiratory called and informed Rapid Covid test negative, request to come put pt on Bipap.

## 2020-01-22 NOTE — Progress Notes (Signed)
Pharmacy Antibiotic Note  Miguel Marsh is a 59 y.o. male admitted on 01/22/2020 with sepsis.  Pharmacy has been consulted for cefepime and vancomycin dosing.  Plan: Vancomycin 1000mg  IV every 12 hours.  Goal trough 15-20 mcg/mL. cefepime 2gm iv q8h  Height: 5\' 6"  (167.6 cm) Weight: 244 lb 11.4 oz (111 kg) IBW/kg (Calculated) : 63.8  Temp (24hrs), Avg:95.6 F (35.3 C), Min:94.5 F (34.7 C), Max:99 F (37.2 C)  Recent Labs  Lab 01/22/20 1209 01/22/20 1432  WBC 11.5*  --   CREATININE 0.90  --   LATICACIDVEN  --  0.8    Estimated Creatinine Clearance: 104.7 mL/min (by C-G formula based on SCr of 0.9 mg/dL).    No Known Allergies  Antimicrobials this admission: 3/25 vancomycin >>  3/25 cefepime >>    Microbiology results: 3/25 BCx: sent 3/25 UCx: sent   3/25 Resp Panel: all negative   Thank you for allowing pharmacy to be a part of this patient's care.  Donna Christen Ziana Heyliger 01/22/2020 4:58 PM

## 2020-01-22 NOTE — ED Notes (Signed)
Pt arousable to voice and stimuli, in and out cath explained. Pt will open eyes when you shake his arm and call his first name.

## 2020-01-23 ENCOUNTER — Inpatient Hospital Stay (HOSPITAL_COMMUNITY): Payer: Medicare HMO

## 2020-01-23 LAB — CBC
HCT: 50.9 % (ref 39.0–52.0)
Hemoglobin: 15.2 g/dL (ref 13.0–17.0)
MCH: 27.8 pg (ref 26.0–34.0)
MCHC: 29.9 g/dL — ABNORMAL LOW (ref 30.0–36.0)
MCV: 93.2 fL (ref 80.0–100.0)
Platelets: 229 10*3/uL (ref 150–400)
RBC: 5.46 MIL/uL (ref 4.22–5.81)
RDW: 14.2 % (ref 11.5–15.5)
WBC: 10.5 10*3/uL (ref 4.0–10.5)
nRBC: 1 % — ABNORMAL HIGH (ref 0.0–0.2)

## 2020-01-23 LAB — BASIC METABOLIC PANEL
Anion gap: 13 (ref 5–15)
BUN: 18 mg/dL (ref 6–20)
CO2: 33 mmol/L — ABNORMAL HIGH (ref 22–32)
Calcium: 8.7 mg/dL — ABNORMAL LOW (ref 8.9–10.3)
Chloride: 94 mmol/L — ABNORMAL LOW (ref 98–111)
Creatinine, Ser: 0.83 mg/dL (ref 0.61–1.24)
GFR calc Af Amer: >60 mL/min (ref >60)
GFR calc non Af Amer: >60 mL/min (ref >60)
Glucose, Bld: 123 mg/dL — ABNORMAL HIGH (ref 70–99)
Potassium: 4.2 mmol/L (ref 3.5–5.1)
Sodium: 140 mmol/L (ref 135–145)

## 2020-01-23 LAB — GLUCOSE, CAPILLARY
Glucose-Capillary: 112 mg/dL — ABNORMAL HIGH (ref 70–99)
Glucose-Capillary: 90 mg/dL (ref 70–99)
Glucose-Capillary: 96 mg/dL (ref 70–99)
Glucose-Capillary: 148 mg/dL — ABNORMAL HIGH (ref 70–99)
Glucose-Capillary: 142 mg/dL — ABNORMAL HIGH (ref 70–99)
Glucose-Capillary: 109 mg/dL — ABNORMAL HIGH (ref 70–99)
Glucose-Capillary: 133 mg/dL — ABNORMAL HIGH (ref 70–99)

## 2020-01-23 LAB — PHOSPHORUS: Phosphorus: 2.1 mg/dL — ABNORMAL LOW (ref 2.5–4.6)

## 2020-01-23 LAB — POCT I-STAT 7, (LYTES, BLD GAS, ICA,H+H)
Acid-Base Excess: 14 mmol/L — ABNORMAL HIGH (ref 0.0–2.0)
Bicarbonate: 40.4 mmol/L — ABNORMAL HIGH (ref 20.0–28.0)
Calcium, Ion: 1.18 mmol/L (ref 1.15–1.40)
HCT: 48 % (ref 39.0–52.0)
Hemoglobin: 16.3 g/dL (ref 13.0–17.0)
O2 Saturation: 98 %
Patient temperature: 37
Potassium: 3.8 mmol/L (ref 3.5–5.1)
Sodium: 140 mmol/L (ref 135–145)
TCO2: 42 mmol/L — ABNORMAL HIGH (ref 22–32)
pCO2 arterial: 52.7 mmHg — ABNORMAL HIGH (ref 32.0–48.0)
pH, Arterial: 7.493 — ABNORMAL HIGH (ref 7.350–7.450)
pO2, Arterial: 97 mmHg (ref 83.0–108.0)

## 2020-01-23 LAB — MAGNESIUM: Magnesium: 1.7 mg/dL (ref 1.7–2.4)

## 2020-01-23 LAB — URINE CULTURE: Culture: 20000 — AB

## 2020-01-23 MED ORDER — FAMOTIDINE IN NACL 20-0.9 MG/50ML-% IV SOLN
20.0000 mg | Freq: Two times a day (BID) | INTRAVENOUS | Status: DC
  Administered 2020-01-23 (×2): 20 mg via INTRAVENOUS
  Filled 2020-01-23 (×2): qty 50

## 2020-01-23 MED ORDER — ORAL CARE MOUTH RINSE
15.0000 mL | Freq: Two times a day (BID) | OROMUCOSAL | Status: DC
  Administered 2020-01-23 – 2020-01-28 (×8): 15 mL via OROMUCOSAL

## 2020-01-23 MED ORDER — SODIUM CHLORIDE 0.9 % IV SOLN: INTRAVENOUS | Status: DC

## 2020-01-23 MED ORDER — IPRATROPIUM-ALBUTEROL 0.5-2.5 (3) MG/3ML IN SOLN: 3.0000 mL | Freq: Four times a day (QID) | RESPIRATORY_TRACT | Status: DC

## 2020-01-23 MED ORDER — METHYLPREDNISOLONE SODIUM SUCC 125 MG IJ SOLR
60.0000 mg | Freq: Four times a day (QID) | INTRAMUSCULAR | Status: DC
  Administered 2020-01-23 – 2020-01-24 (×4): 60 mg via INTRAVENOUS
  Filled 2020-01-23 (×4): qty 2

## 2020-01-23 MED ORDER — FENTANYL 2500MCG IN NS 250ML (10MCG/ML) PREMIX INFUSION: 0.0000 ug/h | INTRAVENOUS | Status: DC

## 2020-01-23 MED ORDER — ACETAMINOPHEN 325 MG PO TABS: 650.0000 mg | ORAL_TABLET | ORAL | Status: DC | PRN

## 2020-01-23 MED ORDER — ENOXAPARIN SODIUM 60 MG/0.6ML ~~LOC~~ SOLN
55.0000 mg | Freq: Every day | SUBCUTANEOUS | Status: DC
  Administered 2020-01-23 – 2020-01-28 (×6): 55 mg via SUBCUTANEOUS
  Filled 2020-01-23 (×2): qty 0.6
  Filled 2020-01-23: qty 0.55
  Filled 2020-01-23 (×2): qty 0.6
  Filled 2020-01-23: qty 0.55

## 2020-01-23 MED ORDER — IPRATROPIUM-ALBUTEROL 0.5-2.5 (3) MG/3ML IN SOLN
3.0000 mL | RESPIRATORY_TRACT | Status: DC | PRN
  Administered 2020-01-26 – 2020-01-28 (×3): 3 mL via RESPIRATORY_TRACT
  Filled 2020-01-23 (×3): qty 3

## 2020-01-23 MED ORDER — POTASSIUM PHOSPHATES 15 MMOLE/5ML IV SOLN
25.0000 mmol | Freq: Once | INTRAVENOUS | Status: AC
  Administered 2020-01-23: 12:00:00 25 mmol via INTRAVENOUS
  Filled 2020-01-23: qty 8.33

## 2020-01-23 MED ORDER — FENTANYL CITRATE (PF) 100 MCG/2ML IJ SOLN: 50.0000 ug | INTRAMUSCULAR | Status: DC | PRN

## 2020-01-23 MED ORDER — ONDANSETRON HCL 4 MG/2ML IJ SOLN: 4.0000 mg | Freq: Four times a day (QID) | INTRAMUSCULAR | Status: DC | PRN

## 2020-01-23 MED ORDER — IPRATROPIUM-ALBUTEROL 0.5-2.5 (3) MG/3ML IN SOLN
3.0000 mL | Freq: Four times a day (QID) | RESPIRATORY_TRACT | Status: DC
  Administered 2020-01-23: 20:00:00 3 mL via RESPIRATORY_TRACT
  Filled 2020-01-23: qty 3

## 2020-01-23 MED ORDER — INSULIN ASPART 100 UNIT/ML ~~LOC~~ SOLN
0.0000 [IU] | SUBCUTANEOUS | Status: DC
  Administered 2020-01-23 (×2): 1 [IU] via SUBCUTANEOUS
  Administered 2020-01-24 (×2): 2 [IU] via SUBCUTANEOUS

## 2020-01-23 MED ORDER — ORAL CARE MOUTH RINSE
15.0000 mL | OROMUCOSAL | Status: DC
  Administered 2020-01-23 (×4): 15 mL via OROMUCOSAL

## 2020-01-23 MED ORDER — FENTANYL CITRATE (PF) 100 MCG/2ML IJ SOLN
50.0000 ug | INTRAMUSCULAR | Status: DC | PRN
  Administered 2020-01-23: 09:00:00 50 ug via INTRAVENOUS

## 2020-01-23 MED ORDER — CHLORHEXIDINE GLUCONATE CLOTH 2 % EX PADS
6.0000 | MEDICATED_PAD | Freq: Every day | CUTANEOUS | Status: DC
  Administered 2020-01-23 – 2020-01-28 (×6): 6 via TOPICAL

## 2020-01-23 MED ORDER — CHLORHEXIDINE GLUCONATE 0.12% ORAL RINSE (MEDLINE KIT): 15.0000 mL | Freq: Two times a day (BID) | OROMUCOSAL | Status: DC

## 2020-01-23 NOTE — Progress Notes (Signed)
RT obtained ABG on pt with the following results. Pt settings at time of gas were PRVC 560  20  60% +5. RT will continue to monitor.   Results for Miguel Marsh, Miguel Marsh (MRN [id number]) as of 01/23/2020 05:47  Ref. Range 01/23/2020 05:19  Sample type Unknown ARTERIAL  pH, Arterial Latest Ref Range: 7.350 - 7.450  7.493 (H)  pCO2 arterial Latest Ref Range: 32.0 - 48.0 mmHg 52.7 (H)  pO2, Arterial Latest Ref Range: 83.0 - 108.0 mmHg 97.0  TCO2 Latest Ref Range: 22 - 32 mmol/L 42 (H)  Acid-Base Excess Latest Ref Range: 0.0 - 2.0 mmol/L 14.0 (H)  Bicarbonate Latest Ref Range: 20.0 - 28.0 mmol/L 40.4 (H)  O2 Saturation Latest Units: % 98.0  Patient temperature Unknown 37.0 C  Collection site Unknown RADIAL, ALLEN'S TEST ACCEPTABLE

## 2020-01-23 NOTE — Progress Notes (Signed)
NAME:  Miguel Marsh, MRN:  [id number], DOB:  12-22-1960, LOS: 1 ADMISSION DATE:  01/22/2020, CONSULTATION DATE:  01/22/2020 REFERRING MD:  Dr. Denton Brick, CHIEF COMPLAINT:  Respiratory failure    Brief History   44 yoM presenting with shortness of breath found to be in acute hypercarbic respiratory failure.  Failed BiPAP requiring intubation.  Post intubation, hypotensive requiring vasopressor support.   History of present illness   HPI obtained from medical chart review as patient is sedated and intubated on mechanical ventilation.    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  Lives alone and denied any known COVID exposures.  Patient stated his symptoms felt like his prior COPD exacerbations.  Unclear if change in cough.     In ER, patient very somnolent but non focal. Temp 95.4, with normotensive, sinus rhythm and saturations 92% on his 2L nasal cannula.  Labs noted for WBC 11.5, Cl 87, CO2 44, BUN 28, trop HS 88- >82, lactic acid 0.8, UA negative, CXR showed mild atelectasis of left lung base.  CT head was normal.  ABG showed severe respiratory acidosis with PH 7.17/ CO2 > 120.  After COVID test was negative, patient placed on BiPAP however, poor improvement requiring intubation for airway protection.  Post intubation, patient hypotensive requiring peripheral vasopressor support.  Patient to be transferred to [facility] for higher level of care, PCCM to accept.    Past Medical History  Arthritis  Bronchitis  Metastatic NSCLC  COPD HTN  Smoker   Significant Hospital Events   3/25 presented to [facility]-> tx [facility]  Consults:    Procedures:  3/25 ETT >>  Significant Diagnostic Tests:  3/25 CTH >>  negative  Micro Data:  3/25 SARS 2/ Flu A/B >> neg 3/25 BCx 2 >> 3/25 UC >> 3/25 Sputum >>   Antimicrobials:  3/25 vanc >> 3/26 3/25 cefepime >>  Interim history/subjective:  Sedation cut in half this am, starting to wake up. Propofol and fentanyl Remains on norepi 4 Apnea on PSV this am  Objective   Blood pressure 105/64, pulse 67, temperature 99.1 F (37.3 C), resp. rate (!) 25, height 5\' 9"  (1.753 m), weight 110.9 kg, SpO2 97 %.    Vent Mode: PRVC FiO2 (%):  [40 %-100 %] 60 % Set Rate:  [20 bmp] 20 bmp Vt Set:  [510 mL-560 mL] 560 mL PEEP:  [5 cmH20] 5 cmH20 Plateau Pressure:  [26 cmH20-34 cmH20] 33 cmH20   Intake/Output Summary (Last 24 hours) at 01/23/2020 8016 Last data filed at 01/23/2020 0800 Gross per 24 hour  Intake 4232.8 ml  Output 1175 ml  Net 3057.8 ml   Filed Weights   01/22/20 1036 01/23/20 0443  Weight: 111 kg 110.9 kg    Examination: General obese man, comfortable on MV Neuro: Wakes to voice, nodded to questions, follows commands then immediately back to sleep HEENT: ET tube in good position, no oral secretions Cardiovascular: Regular, distant, no murmur Lungs: Very distant, no expiratory wheezing Abdomen: Obese, nondistended, positive bowel sounds Musculoskeletal: No edema Skin: No rash  Assessment & Plan:   Acute on chronic Hypoxic and Hypercapnic Respiratory Failure - 2/2 AECOPD Tobacco dependence P: Continue Solu-Medrol, scheduled DuoNeb Lighten sedation and assess for  ability to do spontaneous breathing 3/26 Bronchial hygiene Follow chest x-ray Tobacco cessation counseling once extubated Home regimen = Trelegy  Non-small cell lung cancer, metastatic  -Patient received 2 years of immunotherapy at [facility] -Per chart review last follow up with [facility] oncology 09/2019 Chest CT remains stable with no recurrence of cancer  P: No evidence recurrence at this time Supportive care and follow-up as outpatient at [facility]  Hypotension in  the setting of sedation. Consider septic shock although no clear source Hx of hypertension  P: Wean norepinephrine as able, hopefully will be able to do so as his sedation is lightened.  Goal MAP 65 Home antihypertensive medications are on hold Empiric vancomycin, cefepime started 3/25.  No clear evidence for infection or septic source currently.  Stop vancomycin 3/26, follow culture data.  Check procalcitonin 3/27.  If cultures and procalcitonin reassuring then likely stop cefepime at that time  Best practice:  Diet: NPO. Pain/Anxiety/Delirium protocol (if indicated): Propofol gtt / fentanyl gtt.  RASS goal -1. VAP protocol (if indicated): In place. DVT prophylaxis: Enoxaparin GI prophylaxis: Pepcid Glucose control: SSI. Mobility: Bedrest. Code Status: Full. Family Communication:  Disposition: ICU.  Labs   CBC: Recent Labs  Lab 01/22/20 1209 01/23/20 0233 01/23/20 0519  WBC 11.5* 10.5  --   NEUTROABS 7.8*  --   --   HGB 16.2 15.2 16.3  HCT 56.0* 50.9 48.0  MCV 98.6 93.2  --   PLT 224 229  --     Basic Metabolic Panel: Recent Labs  Lab 01/22/20 1209 01/23/20 0233 01/23/20 0519  NA 139 140 140  K 4.3 4.2 3.8  CL 87* 94*  --   CO2 44* 33*  --   GLUCOSE 100* 123*  --   BUN 28* 18  --   CREATININE 0.90 0.83  --   CALCIUM 8.9 8.7*  --   MG  --  1.7  --   PHOS  --  2.1*  --    GFR: Estimated Creatinine Clearance: 119.1 mL/min (by C-G formula based on SCr of 0.83 mg/dL). Recent Labs  Lab 01/22/20 1209 01/22/20 1432 01/22/20 1733 01/23/20 0233  WBC 11.5*  --   --  10.5  LATICACIDVEN  --  0.8 1.3  --     Liver Function Tests: Recent Labs  Lab 01/22/20 1209  AST 29  ALT 49*  ALKPHOS 65  BILITOT 0.8  PROT 7.1  ALBUMIN 3.7   No results for input(s): LIPASE, AMYLASE in the last 168 hours. Recent Labs  Lab 01/22/20 1209  AMMONIA 35    ABG    Component Value Date/Time   PHART 7.493 (H) 01/23/2020 0519   PCO2ART 52.7 (H) 01/23/2020 0519   PO2ART  97.0 01/23/2020 0519   HCO3 40.4 (H) 01/23/2020 0519   TCO2 42 (H) 01/23/2020 0519   O2SAT 98.0 01/23/2020 0519     Coagulation Profile: No results for input(s): INR, PROTIME in the last 168 hours.  Cardiac Enzymes: No results for input(s): CKTOTAL, CKMB, CKMBINDEX, TROPONINI in the last 168 hours.  HbA1C: Hgb A1c MFr Bld  Date/Time Value Ref Range Status  12/13/2019 06:26 AM 6.4 (H) 4.8 - 5.6 % Final    Comment:    (NOTE) Pre diabetes:          5.7%-6.4% Diabetes:              >6.4% Glycemic control for   <7.0% adults with diabetes   03/21/2018 05:28 AM 6.6 (H)  4.8 - 5.6 % Final    Comment:    (NOTE) Pre diabetes:          5.7%-6.4% Diabetes:              >6.4% Glycemic control for   <7.0% adults with diabetes     CBG: Recent Labs  Lab 01/22/20 2309 01/23/20 0313 01/23/20 0741  GLUCAP 128* 112* 90     Critical care time: 33 min.    Baltazar Apo, MD, PhD 01/23/2020, 9:22 AM [facility] [phone] or if no answer [phone]

## 2020-01-23 NOTE — Progress Notes (Signed)
Pt alert and pointing at ETT wanting it to be removed. Dr. Lamonte Sakai at bedside. Pt placed on CPAP/PS 5/5 40%. Pt lasted for ten minutes and had multiple periods of apneas with desaturations. The lowest SpO2 during wean was 79%. Pt returned to full support and MD notified.

## 2020-01-23 NOTE — Procedures (Signed)
Extubation Procedure Note  Patient Details:   Name: MOISE FRIDAY DOB: Feb 02, 1961 MRN: [id number]   Airway Documentation:    Vent end date: 01/23/20 Vent end time: 1510   Evaluation  O2 sats: stable throughout Complications: No apparent complications Patient did tolerate procedure well. Bilateral Breath Sounds: Clear, Diminished   Yes   Pt extubated per physician order. Pt suctioned orally and via ETT prior. Pt with cuff leak.  Pt extubated to 4L nasal cannula. Pt able to speak name, good cough and no stridor was heard. RT will continue to monitor.   Sharla Kidney 01/23/2020, 3:15 PM

## 2020-01-23 NOTE — Progress Notes (Signed)
Initial Nutrition Assessment  DOCUMENTATION CODES:   Obesity unspecified  INTERVENTION:   -If unable to extubate within 48 hours, recommend:  Initiate Vital High Protein @ 55 ml/hr via OGT (1320 ml/day)  30 ml Prostat BID.    Tube feeding regimen provides 1520 kcal (100% of needs), 146 grams of protein, and 1104 ml of H2O.   TF + propofol provides 1696 kcals  NUTRITION DIAGNOSIS:   Inadequate oral intake related to inability to eat as evidenced by NPO status.  GOAL:   Provide needs based on ASPEN/SCCM guidelines  MONITOR:   Vent status, Labs, Weight trends, Skin, I & O's  REASON FOR ASSESSMENT:   Ventilator    ASSESSMENT:   79 yoM presenting with shortness of breath found to be in acute hypercarbic respiratory failure.  Failed BiPAP requiring intubation.  Post intubation, hypotensive requiring vasopressor support.  Patient is currently intubated on ventilator support. OGT clamped.  MV: 11.2 L/min Temp (24hrs), Avg:97.2 F (36.2 C), Min:94.5 F (34.7 C), Max:99.1 F (37.3 C)  Propofol: 6.66 ml/hr (provides 176 kcals/ daily)  Reviewed I/O's: +3.1 L x 24 hours  UOP: 1 L x 24 hours  MAP: 70  Pt awake on vent and able to communicate via hand gestures. He gave this RD a thumbs up when asked if he was feeling better today.   Medications reviewed and include IV solu-medrol. 0.9% sodium chloride infusion @ 50 ml/hr, fentanyl, and levophed.  Labs reviewed: CBGS: 90-112.   NUTRITION - FOCUSED PHYSICAL EXAM:    Most Recent Value  Orbital Region  No depletion  Upper Arm Region  No depletion  Thoracic and Lumbar Region  No depletion  Buccal Region  No depletion  Temple Region  No depletion  Clavicle Bone Region  No depletion  Clavicle and Acromion Bone Region  No depletion  Scapular Bone Region  No depletion  Dorsal Hand  No depletion  Patellar Region  No depletion  Anterior Thigh Region  No depletion  Posterior Calf Region  No depletion  Edema (RD  Assessment)  Mild  Hair  Reviewed  Eyes  Reviewed  Mouth  Reviewed  Skin  Reviewed  Nails  Reviewed       Diet Order:   Diet Order            Diet NPO time specified  Diet effective now              EDUCATION NEEDS:   No education needs have been identified at this time  Skin:  Skin Assessment: Reviewed RN Assessment  Last BM:  Unknown  Height:   Ht Readings from Last 1 Encounters:  01/22/20 5\' 9"  (1.753 m)    Weight:   Wt Readings from Last 1 Encounters:  01/23/20 110.9 kg    Ideal Body Weight:  72.7 kg  BMI:  Body mass index is 36.1 kg/m.  Estimated Nutritional Needs:   Kcal:  7622-6333  Protein:  > 145 grams  Fluid:  > 1.2 L    Loistine Chance, RD, LDN, [place] Registered Dietitian II Certified Diabetes Care and Education Specialist Please refer to [facility] for RD and/or RD on-call/weekend/after hours pager

## 2020-01-23 NOTE — Progress Notes (Signed)
158mL IV Fentanyl waisted in sink.  Witnessed by [employer].

## 2020-01-24 ENCOUNTER — Encounter (HOSPITAL_COMMUNITY): Payer: Self-pay | Admitting: Emergency Medicine

## 2020-01-24 ENCOUNTER — Inpatient Hospital Stay (HOSPITAL_COMMUNITY): Payer: Medicare HMO

## 2020-01-24 DIAGNOSIS — F17200 Nicotine dependence, unspecified, uncomplicated: Secondary | ICD-10-CM

## 2020-01-24 LAB — CBC
HCT: 52.6 % — ABNORMAL HIGH (ref 39.0–52.0)
Hemoglobin: 15.7 g/dL (ref 13.0–17.0)
MCH: 28.1 pg (ref 26.0–34.0)
MCHC: 29.8 g/dL — ABNORMAL LOW (ref 30.0–36.0)
MCV: 94.1 fL (ref 80.0–100.0)
Platelets: 224 10*3/uL (ref 150–400)
RBC: 5.59 MIL/uL (ref 4.22–5.81)
RDW: 14.5 % (ref 11.5–15.5)
WBC: 10.2 10*3/uL (ref 4.0–10.5)
nRBC: 0.2 % (ref 0.0–0.2)

## 2020-01-24 LAB — PHOSPHORUS: Phosphorus: 4.1 mg/dL (ref 2.5–4.6)

## 2020-01-24 LAB — PROCALCITONIN: Procalcitonin: <0.1 ng/mL

## 2020-01-24 LAB — BASIC METABOLIC PANEL
Anion gap: 9 (ref 5–15)
BUN: 19 mg/dL (ref 6–20)
CO2: 34 mmol/L — ABNORMAL HIGH (ref 22–32)
Calcium: 8.8 mg/dL — ABNORMAL LOW (ref 8.9–10.3)
Chloride: 99 mmol/L (ref 98–111)
Creatinine, Ser: 0.94 mg/dL (ref 0.61–1.24)
GFR calc Af Amer: >60 mL/min (ref >60)
GFR calc non Af Amer: >60 mL/min (ref >60)
Glucose, Bld: 181 mg/dL — ABNORMAL HIGH (ref 70–99)
Potassium: 4.6 mmol/L (ref 3.5–5.1)
Sodium: 142 mmol/L (ref 135–145)

## 2020-01-24 LAB — GLUCOSE, CAPILLARY
Glucose-Capillary: 175 mg/dL — ABNORMAL HIGH (ref 70–99)
Glucose-Capillary: 183 mg/dL — ABNORMAL HIGH (ref 70–99)

## 2020-01-24 MED ORDER — FAMOTIDINE 20 MG PO TABS
20.0000 mg | ORAL_TABLET | Freq: Two times a day (BID) | ORAL | Status: DC
  Filled 2020-01-24: qty 1

## 2020-01-24 MED ORDER — OXYCODONE HCL 5 MG PO TABS
10.0000 mg | ORAL_TABLET | Freq: Three times a day (TID) | ORAL | Status: DC | PRN
  Administered 2020-01-24 – 2020-01-27 (×6): 10 mg via ORAL
  Filled 2020-01-24 (×6): qty 2

## 2020-01-24 MED ORDER — PREDNISONE 20 MG PO TABS
40.0000 mg | ORAL_TABLET | Freq: Every day | ORAL | Status: AC
  Administered 2020-01-25 – 2020-01-28 (×4): 40 mg via ORAL
  Filled 2020-01-24 (×4): qty 2

## 2020-01-24 MED ORDER — METHYLPREDNISOLONE SODIUM SUCC 125 MG IJ SOLR: 40.0000 mg | Freq: Every day | INTRAMUSCULAR | Status: DC

## 2020-01-24 NOTE — Progress Notes (Signed)
NAME:  Miguel Marsh, MRN:  [id number], DOB:  [birth year], LOS: 2 ADMISSION DATE:  01/22/2020, CONSULTATION DATE:  01/22/2020 REFERRING MD:  Dr. Denton Brick, CHIEF COMPLAINT:  Respiratory failure    Brief History   69 yoM presenting with shortness of breath found to be in acute hypercarbic respiratory failure.  Failed BiPAP requiring intubation.  Post intubation, hypotensive requiring vasopressor support.   History of present illness   HPI obtained from medical chart review as patient is sedated and intubated on mechanical ventilation.    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  Lives alone and denied any known COVID exposures.  Patient stated his symptoms felt like his prior COPD exacerbations.  Unclear if change in cough.     In ER, patient very somnolent but non focal. Temp 95.4, with normotensive, sinus rhythm and saturations 92% on his 2L nasal cannula.  Labs noted for WBC 11.5, Cl 87, CO2 44, BUN 28, trop HS 88- >82, lactic acid 0.8, UA negative, CXR showed mild atelectasis of left lung base.  CT head was normal.  ABG showed severe respiratory acidosis with PH 7.17/ CO2 > 120.  After COVID test was negative, patient placed on BiPAP however, poor improvement requiring intubation for airway protection.  Post intubation, patient hypotensive requiring peripheral vasopressor support.  Patient to be transferred to [facility] for higher level of care, PCCM to accept.    Past Medical History  Arthritis  Bronchitis  Metastatic NSCLC  COPD HTN  Smoker   Significant Hospital Events   3/25 presented to [facility]-> [facility]  Consults:    Procedures:  3/25 ETT >>  Significant Diagnostic Tests:  3/25 [facility] >> negative   Micro Data:  3/25 SARS 2/ Flu A/B >> neg 3/25 BCx 2 >>ngtd 3/25 UC >> in and out catheterization with strep gallylyticus 20k units.  3/25 Sputum >> rare GPCs  Antimicrobials:  3/25 vanc >> 3/26 3/25 cefepime >>3/27  Interim history/subjective:  No overnight issues. Working with OT. Tolerating diet.   Objective   Blood pressure (!) 153/74, pulse 84, temperature 97.6 F (36.4 C), temperature source Oral, resp. rate 20, height 5\' 9"  (1.753 m), weight 110.9 kg, SpO2 96 %.    Vent Mode: PRVC FiO2 (%):  [50 %] 50 % Set Rate:  [20 bmp] 20 bmp Vt Set:  [560 mL] 560 mL PEEP:  [5 cmH20] 5 cmH20 Plateau Pressure:  [25 cmH20] 25 cmH20   Intake/Output Summary (Last 24 hours) at 01/24/2020 6761 Last data filed at 01/24/2020 0800 Gross per 24 hour  Intake 2860.81 ml  Output 1725 ml  Net 1135.81 ml   Filed Weights   01/22/20 1036 01/23/20 0443  Weight: 111 kg 110.9 kg    Examination: General obese man, comfortable on MV Neuro: awake, alert, oriented, no distress HEENT: ET tube in good position, no oral secretions Cardiovascular: Regular, distant, no murmur Lungs: Very distant, no expiratory wheezing Abdomen: Obese, nondistended, positive bowel sounds Musculoskeletal: No edema Skin: No rash  Assessment & Plan:   Acute on chronic Hypoxic and Hypercapnic Respiratory Failure - 2/2 AECOPD Tobacco dependence P: Extubated 3/26. On 3LNC, baseline on 2LNC. Transition to prednisone 40 mg daily tomorrow 4 more days Lighten sedation  and assess for ability to do spontaneous breathing 3/26 Bronchial hygiene  Home regimen = Trelegy  Non-small cell lung cancer, metastatic  -Patient received 2 years of immunotherapy at [facility] -Per chart review last follow up with [facility] oncology 09/2019 Chest CT remains stable with no recurrence of cancer  P: No evidence recurrence at this time Supportive care and follow-up as outpatient at [facility]  Hypotension in the setting of sedation.  Hx of  hypertension  P: Resolved. Suspect secondary to sedation. Sepsis ruled out. procalcitonin low. Discontinue abx.   Best practice:  Diet: tolerating diet.  Pain/Anxiety/Delirium protocol (if indicated): n/a VAP protocol (if indicated): n/a DVT prophylaxis: Enoxaparin GI prophylaxis: Pepcid Glucose control: SSI. Mobility: Bedrest. Code Status: Full. Disposition: ok for transfer to RNF. TR[facility] to assume care 3/28. PCCM to see as needed only.   Labs   CBC: Recent Labs  Lab 01/22/20 1209 01/23/20 0233 01/23/20 0519 01/24/20 0337  WBC 11.5* 10.5  --  10.2  NEUTROABS 7.8*  --   --   --   HGB 16.2 15.2 16.3 15.7  HCT 56.0* 50.9 48.0 52.6*  MCV 98.6 93.2  --  94.1  PLT 224 229  --  161    Basic Metabolic Panel: Recent Labs  Lab 01/22/20 1209 01/23/20 0233 01/23/20 0519 01/24/20 0337  NA 139 140 140 142  K 4.3 4.2 3.8 4.6  CL 87* 94*  --  99  CO2 44* 33*  --  34*  GLUCOSE 100* 123*  --  181*  BUN 28* 18  --  19  CREATININE 0.90 0.83  --  0.94  CALCIUM 8.9 8.7*  --  8.8*  MG  --  1.7  --   --   PHOS  --  2.1*  --  4.1   GFR: Estimated Creatinine Clearance: 105.2 mL/min (by C-G formula based on SCr of 0.94 mg/dL). Recent Labs  Lab 01/22/20 1209 01/22/20 1432 01/22/20 1733 01/23/20 0233 01/24/20 0337  PROCALCITON  --   --   --   --  <0.10  WBC 11.5*  --   --  10.5 10.2  LATICACIDVEN  --  0.8 1.3  --   --     Liver Function Tests: Recent Labs  Lab 01/22/20 1209  AST 29  ALT 49*  ALKPHOS 65  BILITOT 0.8  PROT 7.1  ALBUMIN 3.7   No results for input(s): LIPASE, AMYLASE in the last 168 hours. Recent Labs  Lab 01/22/20 1209  AMMONIA 35    ABG    Component Value Date/Time   PHART 7.493 (H) 01/23/2020 0519   PCO2ART 52.7 (H) 01/23/2020 0519   PO2ART 97.0 01/23/2020 0519   HCO3 40.4 (H) 01/23/2020 0519   TCO2 42 (H) 01/23/2020 0519   O2SAT 98.0 01/23/2020 0519     Coagulation Profile: No results for input(s): INR, PROTIME in the last 168 hours.   Cardiac Enzymes: No results for input(s): CKTOTAL, CKMB, CKMBINDEX, TROPONINI in the last 168 hours.  HbA1C: Hgb A1c MFr Bld  Date/Time Value Ref Range Status  12/13/2019 06:26 AM 6.4 (H) 4.8 - 5.6 % Final    Comment:    (NOTE) Pre diabetes:          5.7%-6.4% Diabetes:              >6.4% Glycemic control for   <7.0% adults with diabetes   03/21/2018 05:28 AM 6.6 (H) 4.8 - 5.6 % Final    Comment:    (  NOTE) Pre diabetes:          5.7%-6.4% Diabetes:              >6.4% Glycemic control for   <7.0% adults with diabetes     CBG: Recent Labs  Lab 01/23/20 1803 01/23/20 1925 01/23/20 2300 01/24/20 0337 01/24/20 0715  GLUCAP 142* 109* 133* 175* 183*     Critical care time:  min.   Lenice Llamas, MD Pulmonary and [place] Pager: [place]

## 2020-01-24 NOTE — Evaluation (Signed)
Physical Therapy Evaluation Patient Details Name: Miguel Marsh MRN: [id number] DOB: 1961-09-25 Today's Date: 01/24/2020   History of Present Illness  Pt is a 59 year old man admitted 01/22/20 with hypercarbaric respiratory failure requiring intubation, extubated 01/23/20, pt also requiring vasopressor support. PMH: lung ca with mets, COPD on 2L home 02, HTN.   Clinical Impression  Pt was able to walk around his room with min guard assist. He reports he uses a cane sometimes and has 2 L O2 [place] at home (which he was on throughout our session).  Some DOE, most significantly with leaning forward to try to get his pants and underwear over his feet.  O2 sats stable on 2 L O2 [place] throughout.   PT to follow acutely for deficits listed below.    Follow Up Recommendations No PT follow up    Equipment Recommendations  None recommended by PT    Recommendations for Other Services   NA    Precautions / Restrictions Precautions Precautions: Other (comment) Precaution Comments: monitor vitals/DOE      Mobility  Bed Mobility                  Transfers Overall transfer level: Needs assistance Equipment used: None Transfers: Sit to/from Stand Sit to Stand: Min guard         General transfer comment: min guard assist for safety  Ambulation/Gait Ambulation/Gait assistance: Min guard Gait Distance (Feet): 20 Feet Assistive device: None Gait Pattern/deviations: Step-through pattern;Staggering left;Staggering right     General Gait Details: pt with mildly staggering gait pattern, reports he sometimes uses a cane         Balance Overall balance assessment: Needs assistance Sitting-balance support: Feet supported;No upper extremity supported Sitting balance-Leahy Scale: Good     Standing balance support: No upper extremity supported Standing balance-Leahy Scale: Good                               Pertinent Vitals/Pain Pain Assessment: No/denies pain    Home  Living Family/patient expects to be discharged to:: Private residence Living Arrangements: Alone Available Help at Discharge: Friend(s);Available PRN/intermittently Type of Home: Apartment Home Access: Elevator     Home Layout: One level Home Equipment: Cane - single point;Tub bench;Other (comment)(02) Additional Comments: pt lives in an old school turned into an apartment building in [place], he is on disability, formerly worked in [employer] 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 Assistance Needed: sometimes uses a cane  ADL's / Land Needed: assisted for getting groceries, heavy meal prep and heavy housekeeping        Hand Dominance   Dominant Hand: Right    Extremity/Trunk Assessment   Upper Extremity Assessment Upper Extremity Assessment: Defer to OT evaluation    Lower Extremity Assessment Lower Extremity Assessment: Generalized weakness    Cervical / Trunk Assessment Cervical / Trunk Assessment: Normal  Communication   Communication: No difficulties  Cognition Arousal/Alertness: Awake/alert Behavior During Therapy: WFL for tasks assessed/performed Overall Cognitive Status: Within Functional Limits for tasks assessed                                        General Comments General comments (skin integrity, edema, etc.): Pt needed help getting his underwear and pants over his feet, this more  than gait made him SOB 3/4.  O2 sats on 2 L O2 [place] in the low 90s.         Assessment/Plan    PT Assessment Patient needs continued PT services  PT Problem List Decreased strength;Decreased activity tolerance;Decreased balance;Decreased mobility;Cardiopulmonary status limiting activity;Obesity       PT Treatment Interventions DME instruction;Gait training;Stair training;Functional mobility training;Therapeutic activities;Therapeutic exercise;Balance training;Patient/family education    PT Goals  (Current goals can be found in the Care Plan section)  Acute Rehab PT Goals Patient Stated Goal: to go home PT Goal Formulation: With patient Time For Goal Achievement: 02/07/20 Potential to Achieve Goals: Good    Frequency Min 3X/week           AM-PAC PT "6 Clicks" Mobility  Outcome Measure Help needed turning from your back to your side while in a flat bed without using bedrails?: None Help needed moving from lying on your back to sitting on the side of a flat bed without using bedrails?: None Help needed moving to and from a bed to a chair (including a wheelchair)?: A Little Help needed standing up from a chair using your arms (e.g., wheelchair or bedside chair)?: A Little Help needed to walk in hospital room?: A Little Help needed climbing 3-5 steps with a railing? : A Little 6 Click Score: 20    End of Session Equipment Utilized During Treatment: Oxygen Activity Tolerance: Patient limited by fatigue;Other (comment)(limited by DOE) Patient left: in chair Nurse Communication: Mobility status PT Visit Diagnosis: Muscle weakness (generalized) (M62.81);Difficulty in walking, not elsewhere classified (R26.2)    Time: 0981-1914 PT Time Calculation (min) (ACUTE ONLY): 13 min   Charges:         Verdene Lennert, PT, DPT  Acute Rehabilitation [phone] pager #[phone] office     PT Evaluation $PT Eval Moderate Complexity: 1 Mod         01/24/2020, 4:05 PM

## 2020-01-24 NOTE — Evaluation (Signed)
Occupational Therapy Evaluation Patient Details Name: Miguel Marsh MRN: [id number] DOB: 1960/11/10 Today's Date: 01/24/2020    History of Present Illness Pt is a 59 year old man admitted 01/22/20 with hypercarbaric respiratory failure requiring intubation, extubated 01/23/20, pt also requiring vasopressor support. PMH: lung ca with mets, COPD on 2L home 02, HTN.    Clinical Impression   Pt was using a cane intermittently and sitting on a tub bench at times during showering. He has a friend who helps get groceries and with heavy meals and housekeeping. Pt presents with mild unsteadiness in standing. He fatigues and get SOB with LB ADL and could benefit from education in use of AE and energy conservation strategies.  Do not anticipate pt will need post acute OT. Pt able to maintain Sp02 at 96% on 4L 02 with activity this visit.    Follow Up Recommendations  No OT follow up    Equipment Recommendations  None recommended by OT    Recommendations for Other Services       Precautions / Restrictions Precautions Precautions: Fall Restrictions Weight Bearing Restrictions: No      Mobility Bed Mobility Overal bed mobility: Independent                Transfers Overall transfer level: Needs assistance Equipment used: None Transfers: Sit to/from Stand Sit to Stand: Min guard              Balance Overall balance assessment: Needs assistance   Sitting balance-Leahy Scale: Good       Standing balance-Leahy Scale: Fair                             ADL either performed or assessed with clinical judgement   ADL Overall ADL's : Needs assistance/impaired Eating/Feeding: Independent   Grooming: Min guard;Standing;Wash/dry hands   Upper Body Bathing: Set up;Sitting   Lower Body Bathing: Minimal assistance;Sit to/from stand   Upper Body Dressing : Set up;Sitting   Lower Body Dressing: Minimal assistance;Sit to/from stand   Toilet Transfer: Min  guard;Ambulation   Toileting- Clothing Manipulation and Hygiene: Min guard;Sit to/from stand       Functional mobility during ADLs: Min guard       Vision Baseline Vision/History: Wears glasses Patient Visual Report: No change from baseline       Perception     Praxis      Pertinent Vitals/Pain Pain Assessment: No/denies pain     Hand Dominance Right   Extremity/Trunk Assessment Upper Extremity Assessment Upper Extremity Assessment: Overall WFL for tasks assessed(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)   Lower Extremity Assessment Lower Extremity Assessment: Defer to PT evaluation       Communication Communication Communication: No difficulties   Cognition Arousal/Alertness: Awake/alert Behavior During Therapy: WFL for tasks assessed/performed Overall Cognitive Status: Within Functional Limits for tasks assessed                                     General Comments       Exercises     Shoulder Instructions      Home Living Family/patient expects to be discharged to:: Private residence Living Arrangements: Alone Available Help at Discharge: Friend(s);Available PRN/intermittently Type of Home: Apartment Home Access: Elevator     Home Layout: One level     Bathroom Shower/Tub: Teacher, early years/pre: Standard  Home Equipment: [name] - single point;Tub bench;Other (comment)(02)   Additional Comments: pt lives in an old school turned into an apartment building in [place], he is on disability, formerly worked in Architect      Prior Functioning/Environment Level of Independence: Needs Product/process development scientist / Transfers Assistance Needed: sometimes uses a cane ADL's / Homemaking Assistance Needed: assisted for getting groceries, heavy meal prep and heavy housekeeping            OT Problem List: Decreased activity tolerance;Impaired balance (sitting and/or standing);Decreased knowledge of use of DME or AE;Obesity;Cardiopulmonary  status limiting activity      OT Treatment/Interventions: Self-care/ADL training;DME and/or AE instruction;Energy conservation;Patient/family education    OT Goals(Current goals can be found in the care plan section) Acute Rehab OT Goals Patient Stated Goal: to go home OT Goal Formulation: With patient Time For Goal Achievement: 02/07/20 Potential to Achieve Goals: Good ADL Goals Pt Will Perform Grooming: with modified independence;standing Pt Will Perform Lower Body Bathing: with modified independence;with adaptive equipment;sit to/from stand Pt Will Perform Lower Body Dressing: with modified independence;with adaptive equipment;sit to/from stand Pt Will Transfer to Toilet: with modified independence;ambulating;regular height toilet Pt Will Perform Toileting - Clothing Manipulation and hygiene: with modified independence;sit to/from stand Additional ADL Goal #1: Pt will state at least 3 energy conservation strategies and demonstrate understanding of pursed lip breathing technique.  OT Frequency: Min 2X/week   Barriers to D/C:            Co-evaluation              AM-PAC OT "6 Clicks" Daily Activity     Outcome Measure Help from another person eating meals?: None Help from another person taking care of personal grooming?: A Little Help from another person toileting, which includes using toliet, bedpan, or urinal?: A Little Help from another person bathing (including washing, rinsing, drying)?: A Little Help from another person to put on and taking off regular upper body clothing?: None Help from another person to put on and taking off regular lower body clothing?: A Little 6 Click Score: 20   End of Session Equipment Utilized During Treatment: Oxygen(4L) Nurse Communication: Mobility status  Activity Tolerance: Patient tolerated treatment well Patient left: in chair;with call bell/phone within reach;with nursing/sitter in room  OT Visit Diagnosis: Unsteadiness on feet  (R26.81);Muscle weakness (generalized) (M62.81)                Time: 6237-6283 OT Time Calculation (min): 17 min Charges:  OT General Charges $OT Visit: 1 Visit OT Evaluation $OT Eval Moderate Complexity: 1 Mod  {Naevia Unterreiner, Haze Boyden 01/24/2020, 10:25 AM  Nestor Lewandowsky, OTR/L Acute Rehabilitation Services Pager: [phone] Office: [phone]

## 2020-01-25 LAB — CULTURE, RESPIRATORY W GRAM STAIN: Culture: NORMAL

## 2020-01-25 MED ORDER — SODIUM CHLORIDE 0.9% FLUSH: 10.0000 mL | INTRAVENOUS | Status: DC | PRN

## 2020-01-25 NOTE — Progress Notes (Signed)
Occupational Therapy Treatment Patient Details Name: Miguel Marsh MRN: [id number] DOB: 1961-02-15 Today's Date: 01/25/2020    History of present illness Pt is a 59 year old man admitted 01/22/20 with hypercarbaric respiratory failure requiring intubation, extubated 01/23/20, pt also requiring vasopressor support. PMH: lung ca with mets, COPD on 2L home 02, HTN.    OT comments  Pt making progress in therapy. Provided pt with sock aide and educated pt on use of AE. Pt able to don socks with sock aide while seated EOB requiring min cues on technique. SpO2 dropped to 84% during task with pt requiring ~5 min rest break to return to 90s on 3L [place]. Educated/instructed pt on pursed lip breathing with good understanding and follow through. Provided pt with handout regarding energy conservation, however pt unable to read as he didn't have his glasses with him. Reviewed all energy conservation strategies for self-care and transfer tasks. Educated pt on safety strategies and fall prevention techniques during ADLs and mobility with good understanding. OT will continue to follow acutely.   Follow Up Recommendations  No OT follow up    Equipment Recommendations  None recommended by OT    Recommendations for Other Services      Precautions / Restrictions Precautions Precautions: Other (comment) Precaution Comments: monitor vitals/DOE Restrictions Weight Bearing Restrictions: No       Mobility Bed Mobility Overal bed mobility: Modified Independent             General bed mobility comments: HOB elevated, use of bed rail  Transfers                      Balance Overall balance assessment: Needs assistance Sitting-balance support: Feet supported Sitting balance-Leahy Scale: Good                                     ADL either performed or assessed with clinical judgement   ADL Overall ADL's : Needs assistance/impaired                     Lower Body  Dressing: Supervision/safety;Set up;Sitting/lateral leans Lower Body Dressing Details (indicate cue type and reason): Educated/instructed pt on use of sockaide while seated EOB. Pt demo good understanding requiring min cues on technique.                General ADL Comments: Pt tolerated sitting EOB 15+ min while engaging in LB dressing task and EC education.      Vision   Additional Comments: Pt reports that he wears reading glasses and does not have them present with him in the hospital.    Perception     Praxis      Cognition Arousal/Alertness: Awake/alert Behavior During Therapy: [facility] for tasks assessed/performed Overall Cognitive Status: Within Functional Limits for tasks assessed                                 General Comments: Pt pleasant and willing to participate in therapy        Exercises     Shoulder Instructions       General Comments Pt on 3L [place] with SpO2 dropping to 84% following LB dressing task. Pt required ~5 min seated recovery for SpO2 to return to 90s. 3/4 DOE.     Pertinent Vitals/ Pain  Pain Assessment: No/denies pain  Home Living                                          Prior Functioning/Environment              Frequency           Progress Toward Goals  OT Goals(current goals can now be found in the care plan section)  Progress towards OT goals: Progressing toward goals  ADL Goals Pt Will Perform Grooming: with modified independence;standing Pt Will Perform Lower Body Bathing: with modified independence;with adaptive equipment;sit to/from stand Pt Will Perform Lower Body Dressing: with modified independence;with adaptive equipment;sit to/from stand Pt Will Transfer to Toilet: with modified independence;ambulating;regular height toilet Pt Will Perform Toileting - Clothing Manipulation and hygiene: with modified independence;sit to/from stand Additional ADL Goal #1: Pt will state at least 3  energy conservation strategies and demonstrate understanding of pursed lip breathing technique.  Plan Discharge plan remains appropriate    Co-evaluation                 AM-PAC OT "6 Clicks" Daily Activity     Outcome Measure   Help from another person eating meals?: None Help from another person taking care of personal grooming?: A Little Help from another person toileting, which includes using toliet, bedpan, or urinal?: A Little Help from another person bathing (including washing, rinsing, drying)?: A Little Help from another person to put on and taking off regular upper body clothing?: None Help from another person to put on and taking off regular lower body clothing?: A Little 6 Click Score: 20    End of Session Equipment Utilized During Treatment: Oxygen  OT Visit Diagnosis: Unsteadiness on feet (R26.81);Muscle weakness (generalized) (M62.81)   Activity Tolerance Other (comment)(Limited by SOB)   Patient Left in bed;with call bell/phone within reach   Nurse Communication Mobility status        Time: 1351-1410 OT Time Calculation (min): 19 min  Charges: OT General Charges $OT Visit: 1 Visit OT Treatments $Self Care/Home Management : 8-22 mins  Mauri Brooklyn OTR/L [phone]   Lyman Bishop 01/25/2020, 2:54 PM

## 2020-01-25 NOTE — Progress Notes (Signed)
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 Lives alone and denied any known COVID exposures. Patient stated his symptoms felt like his prior COPD exacerbations. Unclear if change in cough. In ER, patient very somnolent but non focal. Temp 95.4, with normotensive, sinus rhythm and saturations 92% on his 2L nasal cannula. Labs noted for WBC 11.5, Cl 87, CO2 44, BUN 28, trop HS 88- >82, lactic acid 0.8, UA negative, CXR showed mild atelectasis of left lung base. CT head was normal. ABG showed severe respiratory acidosis with PH 7.17/ CO2 >120. After COVID test was negative, patient placed on BiPAP however, poor improvement requiring intubation for airway protection. Post intubation, patient hypotensive requiring peripheral vasopressor support. Patient to be transferred to [facility] for higher level of care, PCCM to accept.   Patient admitted to the ICU on 01/22/2020, successfully extubated on 01/23/2020 -3 L nasal cannula, transition to p.o. steroids and antibiotics, subsequently discharged to the medical floor, pending resolution of hypoxia and symptoms likely disposition home in the next 24 to 48 hours.  Assessment & Plan:   Principal Problem:   Acute on chronic respiratory failure with hypoxia and hypercapnia (HCC) Active Problems:   COPD with acute exacerbation (HCC)   Tobacco use disorder   Essential hypertension   Non-small cell cancer of right lung (HCC)   Respiratory failure (HCC)  Acute on chronic hypoxic hypercapnic respiratory failure in the setting of COPD exacerbation,  POA, resolving Concurrent tobacco abuse, ongoing -Patient intubated 01/22/2020, successfully extubated 01/23/2020 placed on p.o. antibiotics and steroids -Continue supplemental oxygen above baseline, his current baseline at home is 2 L nasal cannula around-the-clock, pending ambulatory oxygen screening likely discharge patient home in the next 24 to 48 hours pending clinical improvement -At this time at bedside patient appears to be clinically improving but not yet back to baseline remarks on marked dyspnea with minimal exertion but does feel generally improved from admission. -PT following, unlikely to need ongoing physical therapy at discharge  Non-small cell lung cancer, metastatic -Status post 2 years of immunotherapy at [facility], follow-up with oncology as scheduled, most recent evaluation in December of last year with CT reported to be unremarkable per patient for any ongoing active disease -Continue supportive care as above  Transient hypotension, without shock -Likely in the setting of sedation medication, procalcitonin negative, no acute infectious process, blood pressure now well controlled off sedation -Follow clinically   DVT prophylaxis: Lovenox Code Status: Full Family Communication: None available Disposition Plan: Inpatient, continues to require close monitoring given acute respiratory failure as above, requiring oxygen well above baseline and ongoing symptoms.  Pending clinical course likely disposition home without home health given PT recommendations.   Consultants:   PCCM  Procedures:   Intubation 01/22/2020, extubation 01/23/2020  Antimicrobials:  Not currently indicated  Subjective: No acute issues or events overnight, patient indicates his respiratory status is markedly improved but not yet back to baseline.  Declines chest pain, nausea, vomiting, diarrhea, constipation, headache, fevers, chills.  Objective: Vitals:   01/24/20 1109 01/24/20 1512 01/24/20  1516 01/24/20 2034  BP:  137/82 (!) 143/76  Pulse:   81 75  Resp:      Temp: 98.6 F (37 C) 98.5 F (36.9 C)  99 F (37.2 C)  TempSrc: Oral Oral  Oral  SpO2:   96% 98%  Weight:      Height:        Intake/Output Summary (Last 24 hours) at 01/25/2020 0716 Last data filed at 01/24/2020 1100 Gross per 24 hour  Intake 744.97 ml  Output 225 ml  Net 519.97 ml   Filed Weights   01/22/20 1036 01/23/20 0443  Weight: 111 kg 110.9 kg    Examination: General:  Pleasantly resting in bed, No acute distress. HEENT:  Normocephalic atraumatic.  Sclerae nonicteric, noninjected.  Extraocular movements intact bilaterally. Neck:  Without mass or deformity.  Trachea is midline. Lungs: Inspiratory and expiratory phase wheezing, diminished breath sounds globally without overt rhonchi or rales Heart:  Regular rate and rhythm.  Without murmurs, rubs, or gallops. Abdomen:  Soft, nontender, nondistended.  Without guarding or rebound. Extremities: Without cyanosis, clubbing, edema, or obvious deformity. Vascular:  Dorsalis pedis and posterior tibial pulses palpable bilaterally. Skin:  Warm and dry, no erythema, no ulcerations.  Data Reviewed: I have personally reviewed following labs and imaging studies  CBC: Recent Labs  Lab 01/22/20 1209 01/23/20 0233 01/23/20 0519 01/24/20 0337  WBC 11.5* 10.5  --  10.2  NEUTROABS 7.8*  --   --   --   HGB 16.2 15.2 16.3 15.7  HCT 56.0* 50.9 48.0 52.6*  MCV 98.6 93.2  --  94.1  PLT 224 229  --  814   Basic Metabolic Panel: Recent Labs  Lab 01/22/20 1209 01/23/20 0233 01/23/20 0519 01/24/20 0337  NA 139 140 140 142  K 4.3 4.2 3.8 4.6  CL 87* 94*  --  99  CO2 44* 33*  --  34*  GLUCOSE 100* 123*  --  181*  BUN 28* 18  --  19  CREATININE 0.90 0.83  --  0.94  CALCIUM 8.9 8.7*  --  8.8*  MG  --  1.7  --   --   PHOS  --  2.1*  --  4.1   GFR: Estimated Creatinine Clearance: 105.2 mL/min (by C-G formula based on SCr of 0.94 mg/dL). Liver Function  Tests: Recent Labs  Lab 01/22/20 1209  AST 29  ALT 49*  ALKPHOS 65  BILITOT 0.8  PROT 7.1  ALBUMIN 3.7   No results for input(s): LIPASE, AMYLASE in the last 168 hours. Recent Labs  Lab 01/22/20 1209  AMMONIA 35   Coagulation Profile: No results for input(s): INR, PROTIME in the last 168 hours. Cardiac Enzymes: No results for input(s): CKTOTAL, CKMB, CKMBINDEX, TROPONINI in the last 168 hours. BNP (last 3 results) No results for input(s): PROBNP in the last 8760 hours. HbA1C: No results for input(s): HGBA1C in the last 72 hours. CBG: Recent Labs  Lab 01/23/20 1803 01/23/20 1925 01/23/20 2300 01/24/20 0337 01/24/20 0715  GLUCAP 142* 109* 133* 175* 183*   Lipid Profile: Recent Labs    01/22/20 1733  TRIG 149   Thyroid Function Tests: No results for input(s): TSH, T4TOTAL, FREET4, T3FREE, THYROIDAB in the last 72 hours. Anemia Panel: No results for input(s): VITAMINB12, FOLATE, FERRITIN, TIBC, IRON, RETICCTPCT in the last 72 hours. Sepsis Labs: Recent Labs  Lab 01/22/20 1432 01/22/20 1733 01/24/20 0337  PROCALCITON  --   --  <0.10  LATICACIDVEN 0.8 1.3  --  Recent Results (from the past 240 hour(s))  Respiratory Panel by RT PCR (Flu A&B, Covid) - Nasopharyngeal Swab     Status: None   Collection Time: 01/22/20 11:58 AM   Specimen: Nasopharyngeal Swab  Result Value Ref Range Status   SARS Coronavirus 2 by RT PCR NEGATIVE NEGATIVE Final    Comment: (NOTE) SARS-CoV-2 target nucleic acids are NOT DETECTED. The SARS-CoV-2 RNA is generally detectable in upper respiratoy specimens during the acute phase of infection. The lowest concentration of SARS-CoV-2 viral copies this assay can detect is 131 copies/mL. A negative result does not preclude SARS-Cov-2 infection and should not be used as the sole basis for treatment or other patient management decisions. A negative result may occur with  improper specimen collection/handling, submission of specimen other  than nasopharyngeal swab, presence of viral mutation(s) within the areas targeted by this assay, and inadequate number of viral copies (<131 copies/mL). A negative result must be combined with clinical observations, patient history, and epidemiological information. The expected result is Negative. Fact Sheet for Patients:  PinkCheek.be Fact Sheet for Healthcare Providers:  GravelBags.it This test is not yet ap proved or cleared by the [place] FDA and  has been authorized for detection and/or diagnosis of SARS-CoV-2 by FDA under an Emergency Use Authorization (EUA). This EUA will remain  in effect (meaning this test can be used) for the duration of the COVID-19 declaration under Section 564(b)(1) of the Act, 21 U.S.C. section 360bbb-3(b)(1), unless the authorization is terminated or revoked sooner.    Influenza A by PCR NEGATIVE NEGATIVE Final   Influenza B by PCR NEGATIVE NEGATIVE Final    Comment: (NOTE) The Xpert Xpress SARS-CoV-2/FLU/RSV assay is intended as an aid in  the diagnosis of influenza from Nasopharyngeal swab specimens and  should not be used as a sole basis for treatment. Nasal washings and  aspirates are unacceptable for Xpert Xpress SARS-CoV-2/FLU/RSV  testing. Fact Sheet for Patients: PinkCheek.be Fact Sheet for Healthcare Providers: GravelBags.it This test is not yet approved or cleared by the [place] FDA and  has been authorized for detection and/or diagnosis of SARS-CoV-2 by  FDA under an Emergency Use Authorization (EUA). This EUA will remain  in effect (meaning this test can be used) for the duration of the  Covid-19 declaration under Section 564(b)(1) of the Act, 21  U.S.C. section 360bbb-3(b)(1), unless the authorization is  terminated or revoked. Performed at [facility], [address] [place], [place] [address]   Blood  culture (routine x 2)     Status: None (Preliminary result)   Collection Time: 01/22/20  2:32 PM   Specimen: Right Antecubital; Blood  Result Value Ref Range Status   Specimen Description RIGHT ANTECUBITAL  Final   Special Requests   Final    BOTTLES DRAWN AEROBIC AND ANAEROBIC Blood Culture adequate volume   Culture   Final    NO GROWTH < 24 HOURS Performed at [facility], [address], [place], [place] [address]    Report Status PENDING  Incomplete  Blood culture (routine x 2)     Status: None (Preliminary result)   Collection Time: 01/22/20  3:46 PM   Specimen: Right Antecubital; Blood  Result Value Ref Range Status   Specimen Description RIGHT ANTECUBITAL  Final   Special Requests   Final    BOTTLES DRAWN AEROBIC ONLY Blood Culture results may not be optimal due to an inadequate volume of blood received in culture bottles   Culture   Final  NO GROWTH < 24 HOURS Performed at [facility], [address], [place], [place] [address]    Report Status PENDING  Incomplete  Urine culture     Status: Abnormal   Collection Time: 01/22/20  4:33 PM   Specimen: In/Out Cath Urine  Result Value Ref Range Status   Specimen Description   Final    IN/OUT CATH URINE Performed at [facility], [address] [place], [place] [address]    Special Requests   Final    NONE Performed at [facility], [address], [place], [place] [address]    Culture (A)  Final    20,000 COLONIES/mL STREPTOCOCCUS GALLOLYTICUS Standardized susceptibility testing for this organism is not available. Performed at [facility], [place] [address] [place], [place] [address]    Report Status 01/23/2020 FINAL  Final  Culture, respiratory (non-expectorated)     Status: None (Preliminary result)   Collection Time: 01/23/20  3:35 AM   Specimen: Tracheal Aspirate; Respiratory  Result Value Ref Range Status   Specimen Description TRACHEAL ASPIRATE  Final   Special Requests NONE  Final   Gram Stain   Final     MODERATE WBC PRESENT, PREDOMINANTLY PMN RARE GRAM POSITIVE COCCI    Culture   Final    CULTURE REINCUBATED FOR BETTER GROWTH Performed at [facility], [place] [address], [place], [place] [address]    Report Status PENDING  Incomplete         Radiology Studies: DG Chest Port 1 View  Result Date: 01/24/2020 CLINICAL DATA:  Acute respiratory failure EXAM: PORTABLE CHEST 1 VIEW COMPARISON:  01/23/2020 FINDINGS: There is mild right basilar scarring. There is no focal consolidation. There is no pleural effusion or pneumothorax. The heart and mediastinal contours are unremarkable. There is no acute osseous abnormality. IMPRESSION: No active disease. Electronically Signed   By: Kathreen Devoid   On: 01/24/2020 07:49        Scheduled Meds: . Chlorhexidine Gluconate Cloth  6 each Topical Daily  . enoxaparin (LOVENOX) injection  55 mg Subcutaneous Daily  . mouth rinse  15 mL Mouth Rinse BID  . predniSONE  40 mg Oral Q breakfast   Continuous Infusions:   LOS: 3 days    Time spent: 1min  Cristina Ceniceros C Mykal Kirchman, DO [facility]  If 7PM-7AM, please contact night-coverage [email or web address]  01/25/2020, 7:16 AM

## 2020-01-26 LAB — BASIC METABOLIC PANEL
Anion gap: 6 (ref 5–15)
BUN: 11 mg/dL (ref 6–20)
CO2: 43 mmol/L — ABNORMAL HIGH (ref 22–32)
Calcium: 8.6 mg/dL — ABNORMAL LOW (ref 8.9–10.3)
Chloride: 92 mmol/L — ABNORMAL LOW (ref 98–111)
Creatinine, Ser: 0.7 mg/dL (ref 0.61–1.24)
GFR calc Af Amer: >60 mL/min (ref >60)
GFR calc non Af Amer: >60 mL/min (ref >60)
Glucose, Bld: 115 mg/dL — ABNORMAL HIGH (ref 70–99)
Potassium: 4.8 mmol/L (ref 3.5–5.1)
Sodium: 141 mmol/L (ref 135–145)

## 2020-01-26 LAB — CBC
HCT: 52.5 % — ABNORMAL HIGH (ref 39.0–52.0)
Hemoglobin: 14.8 g/dL (ref 13.0–17.0)
MCH: 27.7 pg (ref 26.0–34.0)
MCHC: 28.2 g/dL — ABNORMAL LOW (ref 30.0–36.0)
MCV: 98.3 fL (ref 80.0–100.0)
Platelets: 175 10*3/uL (ref 150–400)
RBC: 5.34 MIL/uL (ref 4.22–5.81)
RDW: 14.2 % (ref 11.5–15.5)
WBC: 9.1 10*3/uL (ref 4.0–10.5)
nRBC: 0.2 % (ref 0.0–0.2)

## 2020-01-26 MED ORDER — ALUM & MAG HYDROXIDE-SIMETH 200-200-20 MG/5ML PO SUSP
30.0000 mL | Freq: Once | ORAL | Status: AC
  Administered 2020-01-26: 30 mL via ORAL
  Filled 2020-01-26: qty 30

## 2020-01-26 MED ORDER — LIDOCAINE VISCOUS HCL 2 % MT SOLN
15.0000 mL | Freq: Once | OROMUCOSAL | Status: AC
  Administered 2020-01-26: 15 mL via ORAL
  Filled 2020-01-26: qty 15

## 2020-01-26 NOTE — Progress Notes (Signed)
  SATURATION QUALIFICATIONS:  Patient Saturations on Room Air at Rest = 97%  Patient Saturations on Room Air while Ambulating = 84%  Patient Saturations on 2 Liters of oxygen while Ambulating = 92%

## 2020-01-26 NOTE — Progress Notes (Signed)
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 Lives alone and denied any known COVID exposures. Patient stated his symptoms felt like his prior COPD exacerbations. Unclear if change in cough. In ER, patient very somnolent but non focal. Temp 95.4, with normotensive, sinus rhythm and saturations 92% on his 2L nasal cannula. Labs noted for WBC 11.5, Cl 87, CO2 44, BUN 28, trop HS 88- >82, lactic acid 0.8, UA negative, CXR showed mild atelectasis of left lung base. CT head was normal. ABG showed severe respiratory acidosis with PH 7.17/ CO2 >120. After COVID test was negative, patient placed on BiPAP however, poor improvement requiring intubation for airway protection. Post intubation, patient hypotensive requiring peripheral vasopressor support. Patient to be transferred to [facility] for higher level of care, PCCM to accept.   Patient admitted to the ICU on 01/22/2020, successfully extubated on 01/23/2020 -3 L nasal cannula, transition to p.o. steroids and antibiotics, subsequently discharged to the medical floor, pending resolution of hypoxia and symptoms likely disposition home in the next 24 to 48 hours.  Assessment & Plan:   Principal Problem:   Acute on chronic respiratory failure with hypoxia and hypercapnia (HCC) Active Problems:   COPD with acute exacerbation (HCC)   Tobacco use disorder   Essential hypertension   Non-small cell cancer of right lung (HCC)   Respiratory failure (HCC)   Acute on chronic hypoxic hypercapnic respiratory failure in the setting of COPD exacerbation,  POA, resolving Concurrent tobacco abuse, ongoing -Patient intubated 01/22/2020, successfully extubated 01/23/2020 placed on p.o. antibiotics and steroids -Continue supplemental oxygen above baseline, his current baseline at home is 2 L nasal cannula around-the-clock, pending ambulatory oxygen screening likely discharge patient home in the next 24 to 48 hours pending clinical improvement SpO2: 97 % O2 Flow Rate (L/min): 2 L/min FiO2 (%): 50 % -Patient continues to desat into the low 80s with marked dyspnea with minimal exertion today, continue to follow clinically likely disposition pending clinical improvement and improvement in hypoxia. -PT following, unlikely to need ongoing physical therapy at discharge  Non-small cell lung cancer, metastatic -Status post 2 years of immunotherapy at [facility], follow-up with oncology as scheduled, most recent evaluation in December of last year with CT reported to be unremarkable per patient for any ongoing active disease -Continue supportive care as above  Transient hypoxia overnight concerning for sleep apnea, previously undiagnosed  -Encourage patient to seek outpatient evaluation and treatment, given body habitus, baseline hypoxia patient is at high risk for desaturating overnight even with minimal sleep apnea  Transient hypotension, without shock -Likely in the setting of sedation medication, procalcitonin negative, no acute infectious process, blood pressure now well controlled off sedation -Follow clinically   DVT prophylaxis: Lovenox Code Status: Full Family Communication: None available Disposition Plan: Inpatient, continues to require close monitoring given acute respiratory failure as above, requiring oxygen well above baseline and ongoing symptoms.  Pending clinical course likely disposition home without home health given PT recommendations.  Consultants:   PCCM  Procedures:   Intubation 01/22/2020, extubation  01/23/2020  Antimicrobials:  Not currently indicated  Subjective: No  acute issues or events overnight, patient feels markedly better at rest, transient episode of hypoxia overnight per nursing staff, likely previously undiagnosed sleep apnea given patient's body habitus.  He denies any nausea, vomiting, diarrhea, constipation, headache, fevers, chills.  Still remarks on somewhat moderate dyspnea with minimal exertion ongoing but somewhat improving.  Objective: Vitals:   01/25/20 0815 01/25/20 1654 01/26/20 0611 01/26/20 0618  BP: (!) 146/87 (!) 153/72 133/76   Pulse: 88 81 78 84  Resp: 16 16 (!) 21   Temp: 98.2 F (36.8 C) 98.2 F (36.8 C) 98.1 F (36.7 C)   TempSrc:   Oral   SpO2: 90% 100% (!) 57% 100%  Weight:      Height:       No intake or output data in the 24 hours ending 01/26/20 0712 Filed Weights   01/22/20 1036 01/23/20 0443  Weight: 111 kg 110.9 kg    Examination: General:  Pleasantly resting in bed, No acute distress. HEENT:  Normocephalic atraumatic.  Sclerae nonicteric, noninjected.  Extraocular movements intact bilaterally. Neck:  Without mass or deformity.  Trachea is midline. Lungs: End expiratory phase wheezing, diminished breath sounds globally without overt rhonchi or rales Heart:  Regular rate and rhythm.  Without murmurs, rubs, or gallops. Abdomen:  Soft, nontender, nondistended.  Without guarding or rebound. Extremities: Without cyanosis, clubbing, edema, or obvious deformity. Vascular:  Dorsalis pedis and posterior tibial pulses palpable bilaterally. Skin:  Warm and dry, no erythema, no ulcerations.  Data Reviewed: I have personally reviewed following labs and imaging studies  CBC: Recent Labs  Lab 01/22/20 1209 01/23/20 0233 01/23/20 0519 01/24/20 0337  WBC 11.5* 10.5  --  10.2  NEUTROABS 7.8*  --   --   --   HGB 16.2 15.2 16.3 15.7  HCT 56.0* 50.9 48.0 52.6*  MCV 98.6 93.2  --  94.1  PLT 224 229  --  338   Basic Metabolic Panel: Recent  Labs  Lab 01/22/20 1209 01/23/20 0233 01/23/20 0519 01/24/20 0337  NA 139 140 140 142  K 4.3 4.2 3.8 4.6  CL 87* 94*  --  99  CO2 44* 33*  --  34*  GLUCOSE 100* 123*  --  181*  BUN 28* 18  --  19  CREATININE 0.90 0.83  --  0.94  CALCIUM 8.9 8.7*  --  8.8*  MG  --  1.7  --   --   PHOS  --  2.1*  --  4.1   GFR: Estimated Creatinine Clearance: 105.2 mL/min (by C-G formula based on SCr of 0.94 mg/dL). Liver Function Tests: Recent Labs  Lab 01/22/20 1209  AST 29  ALT 49*  ALKPHOS 65  BILITOT 0.8  PROT 7.1  ALBUMIN 3.7   No results for input(s): LIPASE, AMYLASE in the last 168 hours. Recent Labs  Lab 01/22/20 1209  AMMONIA 35   Coagulation Profile: No results for input(s): INR, PROTIME in the last 168 hours. Cardiac Enzymes: No results for input(s): CKTOTAL, CKMB, CKMBINDEX, TROPONINI in the last 168 hours. BNP (last 3 results) No results for input(s): PROBNP in the last 8760 hours. HbA1C: No results for input(s): HGBA1C in the last 72 hours. CBG: Recent Labs  Lab 01/23/20 1803 01/23/20 1925 01/23/20 2300 01/24/20 0337 01/24/20 0715  GLUCAP 142* 109* 133* 175* 183*   Lipid Profile: No results for input(s): CHOL, HDL, LDLCALC, TRIG, CHOLHDL, LDLDIRECT in the last 72 hours. Thyroid Function Tests: No results for input(s): TSH, T4TOTAL, FREET4,  T3FREE, THYROIDAB in the last 72 hours. Anemia Panel: No results for input(s): VITAMINB12, FOLATE, FERRITIN, TIBC, IRON, RETICCTPCT in the last 72 hours. Sepsis Labs: Recent Labs  Lab 01/22/20 1432 01/22/20 1733 01/24/20 0337  PROCALCITON  --   --  <0.10  LATICACIDVEN 0.8 1.3  --     Recent Results (from the past 240 hour(s))  Respiratory Panel by RT PCR (Flu A&B, Covid) - Nasopharyngeal Swab     Status: None   Collection Time: 01/22/20 11:58 AM   Specimen: Nasopharyngeal Swab  Result Value Ref Range Status   SARS Coronavirus 2 by RT PCR NEGATIVE NEGATIVE Final    Comment: (NOTE) SARS-CoV-2 target nucleic  acids are NOT DETECTED. The SARS-CoV-2 RNA is generally detectable in upper respiratoy specimens during the acute phase of infection. The lowest concentration of SARS-CoV-2 viral copies this assay can detect is 131 copies/mL. A negative result does not preclude SARS-Cov-2 infection and should not be used as the sole basis for treatment or other patient management decisions. A negative result may occur with  improper specimen collection/handling, submission of specimen other than nasopharyngeal swab, presence of viral mutation(s) within the areas targeted by this assay, and inadequate number of viral copies (<131 copies/mL). A negative result must be combined with clinical observations, patient history, and epidemiological information. The expected result is Negative. Fact Sheet for Patients:  PinkCheek.be Fact Sheet for Healthcare Providers:  GravelBags.it This test is not yet ap proved or cleared by the [place] FDA and  has been authorized for detection and/or diagnosis of SARS-CoV-2 by FDA under an Emergency Use Authorization (EUA). This EUA will remain  in effect (meaning this test can be used) for the duration of the COVID-19 declaration under Section 564(b)(1) of the Act, 21 U.S.C. section 360bbb-3(b)(1), unless the authorization is terminated or revoked sooner.    Influenza A by PCR NEGATIVE NEGATIVE Final   Influenza B by PCR NEGATIVE NEGATIVE Final    Comment: (NOTE) The Xpert Xpress SARS-CoV-2/FLU/RSV assay is intended as an aid in  the diagnosis of influenza from Nasopharyngeal swab specimens and  should not be used as a sole basis for treatment. Nasal washings and  aspirates are unacceptable for Xpert Xpress SARS-CoV-2/FLU/RSV  testing. Fact Sheet for Patients: PinkCheek.be Fact Sheet for Healthcare Providers: GravelBags.it This test is not yet  approved or cleared by the [place] FDA and  has been authorized for detection and/or diagnosis of SARS-CoV-2 by  FDA under an Emergency Use Authorization (EUA). This EUA will remain  in effect (meaning this test can be used) for the duration of the  Covid-19 declaration under Section 564(b)(1) of the Act, 21  U.S.C. section 360bbb-3(b)(1), unless the authorization is  terminated or revoked. Performed at [facility], [address] [place], [place] [address]   Blood culture (routine x 2)     Status: None (Preliminary result)   Collection Time: 01/22/20  2:32 PM   Specimen: Right Antecubital; Blood  Result Value Ref Range Status   Specimen Description RIGHT ANTECUBITAL  Final   Special Requests   Final    BOTTLES DRAWN AEROBIC AND ANAEROBIC Blood Culture adequate volume   Culture   Final    NO GROWTH 3 DAYS Performed at [facility], [address] [place], [place] [address]    Report Status PENDING  Incomplete  Blood culture (routine x 2)     Status: None (Preliminary result)   Collection Time: 01/22/20  3:46 PM   Specimen: Right  Antecubital; Blood  Result Value Ref Range Status   Specimen Description RIGHT ANTECUBITAL  Final   Special Requests   Final    BOTTLES DRAWN AEROBIC ONLY Blood Culture results may not be optimal due to an inadequate volume of blood received in culture bottles   Culture   Final    NO GROWTH 3 DAYS Performed at [facility], [address] [place], [place] [address]    Report Status PENDING  Incomplete  Urine culture     Status: Abnormal   Collection Time: 01/22/20  4:33 PM   Specimen: In/Out Cath Urine  Result Value Ref Range Status   Specimen Description   Final    IN/OUT CATH URINE Performed at [facility], [address], [place], [place] [address]    Special Requests   Final    NONE Performed at [facility], [address], [place], [place] [address]    Culture (A)  Final    20,000 COLONIES/mL STREPTOCOCCUS GALLOLYTICUS Standardized  susceptibility testing for this organism is not available. Performed at [facility], [place] [address] [place], [place] [address]    Report Status 01/23/2020 FINAL  Final  Culture, respiratory (non-expectorated)     Status: None   Collection Time: 01/23/20  3:35 AM   Specimen: Tracheal Aspirate; Respiratory  Result Value Ref Range Status   Specimen Description TRACHEAL ASPIRATE  Final   Special Requests NONE  Final   Gram Stain   Final    MODERATE WBC PRESENT, PREDOMINANTLY PMN RARE GRAM POSITIVE COCCI    Culture   Final    Consistent with normal respiratory flora. Performed at [facility], [place] [address], [place], [place] [address]    Report Status 01/25/2020 FINAL  Final    Radiology Studies: No results found.  Scheduled Meds: . Chlorhexidine Gluconate Cloth  6 each Topical Daily  . enoxaparin (LOVENOX) injection  55 mg Subcutaneous Daily  . mouth rinse  15 mL Mouth Rinse BID  . predniSONE  40 mg Oral Q breakfast   Continuous Infusions:   LOS: 4 days   Time spent: 42min  Glen Blatchley C Zakariyya Helfman, DO [facility]  If 7PM-7AM, please contact night-coverage [email or web address]  01/26/2020, 7:12 AM

## 2020-01-27 LAB — BASIC METABOLIC PANEL
Anion gap: 7 (ref 5–15)
BUN: 10 mg/dL (ref 6–20)
CO2: 41 mmol/L — ABNORMAL HIGH (ref 22–32)
Calcium: 8.5 mg/dL — ABNORMAL LOW (ref 8.9–10.3)
Chloride: 93 mmol/L — ABNORMAL LOW (ref 98–111)
Creatinine, Ser: 0.68 mg/dL (ref 0.61–1.24)
GFR calc Af Amer: >60 mL/min (ref >60)
GFR calc non Af Amer: >60 mL/min (ref >60)
Glucose, Bld: 115 mg/dL — ABNORMAL HIGH (ref 70–99)
Potassium: 4.5 mmol/L (ref 3.5–5.1)
Sodium: 141 mmol/L (ref 135–145)

## 2020-01-27 LAB — CULTURE, BLOOD (ROUTINE X 2)
Culture: NO GROWTH
Culture: NO GROWTH
Special Requests: ADEQUATE

## 2020-01-27 LAB — CBC
HCT: 48.9 % (ref 39.0–52.0)
Hemoglobin: 14.2 g/dL (ref 13.0–17.0)
MCH: 27.9 pg (ref 26.0–34.0)
MCHC: 29 g/dL — ABNORMAL LOW (ref 30.0–36.0)
MCV: 96.1 fL (ref 80.0–100.0)
Platelets: 163 10*3/uL (ref 150–400)
RBC: 5.09 MIL/uL (ref 4.22–5.81)
RDW: 13.6 % (ref 11.5–15.5)
WBC: 9.6 10*3/uL (ref 4.0–10.5)
nRBC: 0 % (ref 0.0–0.2)

## 2020-01-27 MED ORDER — FUROSEMIDE 10 MG/ML IJ SOLN
40.0000 mg | Freq: Once | INTRAMUSCULAR | Status: AC
  Administered 2020-01-27: 40 mg via INTRAVENOUS
  Filled 2020-01-27: qty 4

## 2020-01-27 MED ORDER — ADULT MULTIVITAMIN W/MINERALS CH
1.0000 | ORAL_TABLET | Freq: Every day | ORAL | Status: DC
  Administered 2020-01-27 – 2020-01-28 (×2): 1 via ORAL
  Filled 2020-01-27 (×2): qty 1

## 2020-01-27 MED ORDER — ENSURE ENLIVE PO LIQD: 237.0000 mL | Freq: Two times a day (BID) | ORAL | Status: DC

## 2020-01-27 NOTE — Progress Notes (Signed)
Nutrition Follow-up  DOCUMENTATION CODES:   Obesity unspecified  INTERVENTION:   -Ensure Enlive po BID, each supplement provides 350 kcal and 20 grams of protein -MVI with minerals daily  NUTRITION DIAGNOSIS:   Inadequate oral intake related to inability to eat as evidenced by NPO status.  Progressing; extubated and advanced to heart healthy diet  GOAL:   Patient will meet greater than or equal to 90% of their needs  Progressing  MONITOR:   PO intake, Supplement acceptance, Labs, Weight trends, Skin  REASON FOR ASSESSMENT:   Ventilator    ASSESSMENT:   25 yoM presenting with shortness of breath found to be in acute hypercarbic respiratory failure.  Failed BiPAP requiring intubation.  Post intubation, hypotensive requiring vasopressor support.  3/26- extubated  Spoke with pt at bedside, who was pleasant and in good spirits today. Observed meal tray on tray table- pt consumed 100% of breakfast tray. Pt shares that he was experiencing a decreased appetite at home approximately one week PTA due to weakness. Prior to this, pt shares he is appetite is at baseline. He typically consumes 2 meals per day and grazes on snacks, such as fruit and crackers throughout the day. Pt reports that he has been receiving 2 meals per day (consisting of a meat, starch, and vegetable), which are provided by his insurance company. Pt is very grateful for this service.   Pt denies any weight loss. He denies any further nutrition-related questions, but expressed appreciation for visits. Discussed importance of good meal and supplement intake to promote healing.   Medications reviewed and include prednisone.   Labs reviewed: CBGS: 175-183.   Diet Order:   Diet Order            Diet Heart Room service appropriate? Yes; Fluid consistency: Thin  Diet effective now              EDUCATION NEEDS:   Education needs have been addressed  Skin:  Skin Assessment: Reviewed RN Assessment  Last  BM:  01/27/20  Height:   Ht Readings from Last 1 Encounters:  01/22/20 5\' 9"  (1.753 m)    Weight:   Wt Readings from Last 1 Encounters:  01/23/20 110.9 kg    Ideal Body Weight:  72.7 kg  BMI:  Body mass index is 36.1 kg/m.  Estimated Nutritional Needs:   Kcal:  2200-2400  Protein:  110-125 grams  Fluid:  > 2.2 L    Loistine Chance, RD, LDN, [place] Registered Dietitian II Certified Diabetes Care and Education Specialist Please refer to [facility] for RD and/or RD on-call/weekend/after hours pager

## 2020-01-27 NOTE — Plan of Care (Signed)

## 2020-01-27 NOTE — Progress Notes (Addendum)
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 Lives alone and denied any known COVID exposures. Patient stated his symptoms felt like his prior COPD exacerbations. Unclear if change in cough. In ER, patient very somnolent but non focal. Temp 95.4, with normotensive, sinus rhythm and saturations 92% on his 2L nasal cannula. Labs noted for WBC 11.5, Cl 87, CO2 44, BUN 28, trop HS 88- >82, lactic acid 0.8, UA negative, CXR showed mild atelectasis of left lung base. CT head was normal. ABG showed severe respiratory acidosis with PH 7.17/ CO2 >120. After COVID test was negative, patient placed on BiPAP however, poor improvement requiring intubation for airway protection. Post intubation, patient hypotensive requiring peripheral vasopressor support. Patient to be transferred to [facility] for higher level of care, PCCM to accept.   Patient admitted to the ICU on 01/22/2020, successfully extubated on 01/23/2020 -3 L nasal cannula, transition to p.o. steroids and antibiotics, subsequently discharged to the medical floor, pending resolution of hypoxia and symptoms likely disposition home in the next 24 to 48 hours.  Assessment & Plan:   Principal Problem:   Acute on chronic respiratory failure with hypoxia and hypercapnia (HCC) Active Problems:   COPD with acute exacerbation (HCC)   Tobacco use disorder   Essential hypertension   Non-small cell cancer of right lung (HCC)   Respiratory failure (HCC)  Acute on chronic hypoxic hypercapnic respiratory failure in the setting of COPD exacerbation,  POA, resolving Concurrent tobacco abuse, ongoing -Patient intubated 01/22/2020, successfully extubated 01/23/2020 placed on p.o. antibiotics (now completed) and steroids -Continue supplemental oxygen above baseline, his current baseline at home is 2 L nasal cannula around-the-clock, pending ambulatory oxygen screening likely discharge patient home in the next 24 to 48 hours pending clinical improvement SpO2: 94 % O2 Flow Rate (L/min): 3 L/min FiO2 (%): 50 % -Patient continues to desat requiring upwards of 4L [place] while ambulating to maintain sats, still complaining of moderate dyspnea with exertion today despite being on more oxygen than his typical baseline. -Mild volume overload - likely from previous IVF burden - IV lasix 40 x1. Follow I/Os -PT following, unlikely to need ongoing physical therapy at discharge  Non-small cell lung cancer, metastatic -Status post 2 years of immunotherapy at [facility], follow-up with oncology as scheduled, most recent evaluation in December of last year with CT reported to be unremarkable per patient for any ongoing active disease -Continue supportive care as above  Transient hypoxia overnight concerning for sleep apnea, previously undiagnosed  -Encourage patient to seek outpatient evaluation and treatment, given body habitus, baseline hypoxia patient is at high risk for desaturating overnight even with minimal sleep apnea  Transient hypotension, without shock -Likely in the setting of sedation medication, procalcitonin negative, no acute infectious process, blood pressure now well controlled off sedation -Follow clinically   DVT prophylaxis: Lovenox Code Status: Full Family Communication: None available Disposition Plan: Inpatient, continues to require close monitoring given acute respiratory failure as above, pending improvement in hypoxia and dyspnea with exertion will DC home, no home health/PT needs, in the next 24-48h.  Consultants:  PCCM  Procedures:   Intubation 01/22/2020, extubation 01/23/2020  Antimicrobials:  Not currently indicated  Subjective: No acute issues or events overnight, patient feels markedly better at rest, transient episode of hypoxia overnight previously per documentation.  He denies any nausea, vomiting, diarrhea, constipation, headache, fevers, chills.  Still remarks on somewhat moderate dyspnea with minimal exertion ongoing.  Objective: Vitals:   01/26/20 0618 01/26/20 0758 01/26/20 2349 01/27/20 0814  BP:  128/74 138/75 133/66  Pulse: 84 74 88 79  Resp:  20  16  Temp:  (!) 97.5 F (36.4 C) 98.6 F (37 C) 98.5 F (36.9 C)  TempSrc:  Oral Oral   SpO2: 100% 97%  94%  Weight:      Height:       No intake or output data in the 24 hours ending 01/27/20 1111 Filed Weights   01/22/20 1036 01/23/20 0443  Weight: 111 kg 110.9 kg    Examination: General:  Pleasantly resting in bed, No acute distress. HEENT:  Normocephalic atraumatic.  Sclerae nonicteric, noninjected.  Extraocular movements intact bilaterally. Neck:  Without mass or deformity.  Trachea is midline. Lungs: End expiratory phase wheezing bilaterally left greater than right, diminished breath sounds globally without overt rhonchi or rales Heart:  Regular rate and rhythm.  Without murmurs, rubs, or gallops. Abdomen:  Soft, nontender, nondistended.  Without guarding or rebound. Extremities: Without cyanosis, clubbing, edema, or obvious deformity. Vascular:  Dorsalis pedis and posterior tibial pulses palpable bilaterally. Skin:  Warm and dry, no erythema, no ulcerations.  Data Reviewed: I have personally reviewed following labs and imaging studies  CBC: Recent Labs  Lab 01/22/20 1209 01/22/20 1209 01/23/20 0233 01/23/20 0519 01/24/20 0337 01/26/20 0815 01/27/20 0455  WBC 11.5*  --  10.5  --  10.2 9.1 9.6  NEUTROABS 7.8*  --   --   --   --   --   --   HGB 16.2   < > 15.2 16.3 15.7 14.8 14.2  HCT 56.0*   < > 50.9  48.0 52.6* 52.5* 48.9  MCV 98.6  --  93.2  --  94.1 98.3 96.1  PLT 224  --  229  --  224 175 163   < > = values in this interval not displayed.   Basic Metabolic Panel: Recent Labs  Lab 01/22/20 1209 01/22/20 1209 01/23/20 0233 01/23/20 0519 01/24/20 0337 01/26/20 0815 01/27/20 0455  NA 139   < > 140 140 142 141 141  K 4.3   < > 4.2 3.8 4.6 4.8 4.5  CL 87*  --  94*  --  99 92* 93*  CO2 44*  --  33*  --  34* 43* 41*  GLUCOSE 100*  --  123*  --  181* 115* 115*  BUN 28*  --  18  --  19 11 10   CREATININE 0.90  --  0.83  --  0.94 0.70 0.68  CALCIUM 8.9  --  8.7*  --  8.8* 8.6* 8.5*  MG  --   --  1.7  --   --   --   --   PHOS  --   --  2.1*  --  4.1  --   --    < > = values in this interval not displayed.   GFR: Estimated Creatinine Clearance: 123.6 mL/min (by C-G formula based on SCr of 0.68 mg/dL). Liver Function Tests: Recent Labs  Lab 01/22/20 1209  AST 29  ALT 49*  ALKPHOS  65  BILITOT 0.8  PROT 7.1  ALBUMIN 3.7   No results for input(s): LIPASE, AMYLASE in the last 168 hours. Recent Labs  Lab 01/22/20 1209  AMMONIA 35   Coagulation Profile: No results for input(s): INR, PROTIME in the last 168 hours. Cardiac Enzymes: No results for input(s): CKTOTAL, CKMB, CKMBINDEX, TROPONINI in the last 168 hours. BNP (last 3 results) No results for input(s): PROBNP in the last 8760 hours. HbA1C: No results for input(s): HGBA1C in the last 72 hours. CBG: Recent Labs  Lab 01/23/20 1803 01/23/20 1925 01/23/20 2300 01/24/20 0337 01/24/20 0715  GLUCAP 142* 109* 133* 175* 183*   Lipid Profile: No results for input(s): CHOL, HDL, LDLCALC, TRIG, CHOLHDL, LDLDIRECT in the last 72 hours. Thyroid Function Tests: No results for input(s): TSH, T4TOTAL, FREET4, T3FREE, THYROIDAB in the last 72 hours. Anemia Panel: No results for input(s): VITAMINB12, FOLATE, FERRITIN, TIBC, IRON, RETICCTPCT in the last 72 hours. Sepsis Labs: Recent Labs  Lab 01/22/20 1432 01/22/20 1733  01/24/20 0337  PROCALCITON  --   --  <0.10  LATICACIDVEN 0.8 1.3  --     Recent Results (from the past 240 hour(s))  Respiratory Panel by RT PCR (Flu A&B, Covid) - Nasopharyngeal Swab     Status: None   Collection Time: 01/22/20 11:58 AM   Specimen: Nasopharyngeal Swab  Result Value Ref Range Status   SARS Coronavirus 2 by RT PCR NEGATIVE NEGATIVE Final    Comment: (NOTE) SARS-CoV-2 target nucleic acids are NOT DETECTED. The SARS-CoV-2 RNA is generally detectable in upper respiratoy specimens during the acute phase of infection. The lowest concentration of SARS-CoV-2 viral copies this assay can detect is 131 copies/mL. A negative result does not preclude SARS-Cov-2 infection and should not be used as the sole basis for treatment or other patient management decisions. A negative result may occur with  improper specimen collection/handling, submission of specimen other than nasopharyngeal swab, presence of viral mutation(s) within the areas targeted by this assay, and inadequate number of viral copies (<131 copies/mL). A negative result must be combined with clinical observations, patient history, and epidemiological information. The expected result is Negative. Fact Sheet for Patients:  PinkCheek.be Fact Sheet for Healthcare Providers:  GravelBags.it This test is not yet ap proved or cleared by the [place] FDA and  has been authorized for detection and/or diagnosis of SARS-CoV-2 by FDA under an Emergency Use Authorization (EUA). This EUA will remain  in effect (meaning this test can be used) for the duration of the COVID-19 declaration under Section 564(b)(1) of the Act, 21 U.S.C. section 360bbb-3(b)(1), unless the authorization is terminated or revoked sooner.    Influenza A by PCR NEGATIVE NEGATIVE Final   Influenza B by PCR NEGATIVE NEGATIVE Final    Comment: (NOTE) The Xpert Xpress SARS-CoV-2/FLU/RSV assay is  intended as an aid in  the diagnosis of influenza from Nasopharyngeal swab specimens and  should not be used as a sole basis for treatment. Nasal washings and  aspirates are unacceptable for Xpert Xpress SARS-CoV-2/FLU/RSV  testing. Fact Sheet for Patients: PinkCheek.be Fact Sheet for Healthcare Providers: GravelBags.it This test is not yet approved or cleared by the [place] FDA and  has been authorized for detection and/or diagnosis of SARS-CoV-2 by  FDA under an Emergency Use Authorization (EUA). This EUA will remain  in effect (meaning this test can be used) for the duration of the  Covid-19 declaration under Section 564(b)(1) of the Act, 21  U.S.C. section 360bbb-3(b)(1), unless  the authorization is  terminated or revoked. Performed at [facility], [address] [place], [place] [address]   Blood culture (routine x 2)     Status: None   Collection Time: 01/22/20  2:32 PM   Specimen: Right Antecubital; Blood  Result Value Ref Range Status   Specimen Description RIGHT ANTECUBITAL  Final   Special Requests   Final    BOTTLES DRAWN AEROBIC AND ANAEROBIC Blood Culture adequate volume   Culture   Final    NO GROWTH 5 DAYS Performed at [facility], [address], [place], [place] [address]    Report Status 01/27/2020 FINAL  Final  Blood culture (routine x 2)     Status: None   Collection Time: 01/22/20  3:46 PM   Specimen: Right Antecubital; Blood  Result Value Ref Range Status   Specimen Description RIGHT ANTECUBITAL  Final   Special Requests   Final    BOTTLES DRAWN AEROBIC ONLY Blood Culture results may not be optimal due to an inadequate volume of blood received in culture bottles   Culture   Final    NO GROWTH 5 DAYS Performed at [facility], [address] [place], [place] [address]    Report Status 01/27/2020 FINAL  Final  Urine culture     Status: Abnormal   Collection Time: 01/22/20  4:33 PM    Specimen: In/Out Cath Urine  Result Value Ref Range Status   Specimen Description   Final    IN/OUT CATH URINE Performed at [facility], [address] [place], [place] [address]    Special Requests   Final    NONE Performed at [facility], [address] [place], [place] [address]    Culture (A)  Final    20,000 COLONIES/mL STREPTOCOCCUS GALLOLYTICUS Standardized susceptibility testing for this organism is not available. Performed at [facility], [place] [address], [place], [place] [address]    Report Status 01/23/2020 FINAL  Final  Culture, respiratory (non-expectorated)     Status: None   Collection Time: 01/23/20  3:35 AM   Specimen: Tracheal Aspirate; Respiratory  Result Value Ref Range Status   Specimen Description TRACHEAL ASPIRATE  Final   Special Requests NONE  Final   Gram Stain   Final    MODERATE WBC PRESENT, PREDOMINANTLY PMN RARE GRAM POSITIVE COCCI    Culture   Final    Consistent with normal respiratory flora. Performed at [facility], [place] [address], [place], [place] [address]    Report Status 01/25/2020 FINAL  Final    Radiology Studies: No results found.  Scheduled Meds: . Chlorhexidine Gluconate Cloth  6 each Topical Daily  . enoxaparin (LOVENOX) injection  55 mg Subcutaneous Daily  . mouth rinse  15 mL Mouth Rinse BID  . predniSONE  40 mg Oral Q breakfast   Continuous Infusions:   LOS: 5 days   Time spent: 49min  Lora Glomski C Kamyah Wilhelmsen, DO [facility]  If 7PM-7AM, please contact night-coverage [email or web address]  01/27/2020, 11:11 AM

## 2020-01-27 NOTE — Progress Notes (Signed)
SATURATION QUALIFICATIONS: (This note is used to comply with regulatory documentation for home oxygen)  Patient Saturations on Room Air at Rest = 82%  Patient Saturations on 3L of oxygen while at rest = 95%  Patient Saturations on Room Air while Ambulating = 80% and below  Patient Saturations on 4 Liters of oxygen while Ambulating = 92%  Please briefly explain why patient needs home oxygen:  During ambulation, pt became short of breathe and light headed on 3L of O2 when pulse ox was 87%. RN increased O2 to 4L. Pulse ox made it back to 92% by time pt got back to room. Pt required a recovery period of rest after walk up hall and around half circle then back to room. Pt asked for a breathing tx. RN will administer and continue to monitor pt. RN will share findings with [place] Gully MD through page.

## 2020-01-27 NOTE — Progress Notes (Deleted)
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 Lives alone and denied any known COVID exposures. Patient stated his symptoms felt like his prior COPD exacerbations. Unclear if change in cough. In ER, patient very somnolent but non focal. Temp 95.4, with normotensive, sinus rhythm and saturations 92% on his 2L nasal cannula. Labs noted for WBC 11.5, Cl 87, CO2 44, BUN 28, trop HS 88- >82, lactic acid 0.8, UA negative, CXR showed mild atelectasis of left lung base. CT head was normal. ABG showed severe respiratory acidosis with PH 7.17/ CO2 >120. After COVID test was negative, patient placed on BiPAP however, poor improvement requiring intubation for airway protection. Post intubation, patient hypotensive requiring peripheral vasopressor support. Patient to be transferred to [facility] for higher level of care, PCCM to accept.   Patient admitted to the ICU on 01/22/2020, successfully extubated on 01/23/2020 -3 L nasal cannula, transition to p.o. steroids and antibiotics, subsequently discharged to the medical floor, pending resolution of hypoxia and symptoms likely disposition home in the next 24 to 48 hours.  Assessment & Plan:   Principal Problem:   Acute on chronic respiratory failure with hypoxia and hypercapnia (HCC) Active Problems:   COPD with acute exacerbation (HCC)   Tobacco use disorder   Essential hypertension   Non-small cell cancer of right lung (HCC)   Respiratory failure (HCC)  Acute on chronic hypoxic hypercapnic respiratory failure in the setting of COPD exacerbation,  POA, resolving Concurrent tobacco abuse, ongoing -Patient intubated 01/22/2020, successfully extubated 01/23/2020 placed on p.o. antibiotics (now completed) and steroids -Continue supplemental oxygen above baseline, his current baseline at home is 2 L nasal cannula around-the-clock, pending ambulatory oxygen screening likely discharge patient home in the next 24 to 48 hours pending clinical improvement SpO2: 94 % O2 Flow Rate (L/min): 3 L/min FiO2 (%): 50 % -Patient continues to desat requiring upwards of 4L [place] while ambulating to maintain sats, still complaining of moderate dyspnea with exertion despite being on more oxygen than his typical baseline. -PT following, unlikely to need ongoing physical therapy at discharge  Non-small cell lung cancer, metastatic -Status post 2 years of immunotherapy at [facility], follow-up with oncology as scheduled, most recent evaluation in December of last year with CT reported to be unremarkable per patient for any ongoing active disease -Continue supportive care as above  Transient hypoxia overnight concerning for sleep apnea, previously undiagnosed  -Encourage patient to seek outpatient evaluation and treatment, given body habitus, baseline hypoxia patient is at high risk for desaturating overnight even with minimal sleep apnea  Transient hypotension, without shock -Likely in the setting of sedation medication, procalcitonin negative, no acute infectious process, blood pressure now well controlled off sedation -Follow clinically   DVT prophylaxis: Lovenox Code Status: Full Family Communication: None available Disposition Plan: Inpatient, continues to require close monitoring given acute respiratory failure as above, pending improvement in hypoxia and dyspnea with exertion will DC home, no home health/PT needs, in the next 24-48h.  Consultants:   PCCM  Procedures:   Intubation 01/22/2020, extubation 01/23/2020  Antimicrobials:  Not currently  indicated  Subjective:  No acute issues or events overnight, patient feels moderately improved at rest.  He denies any nausea, vomiting, diarrhea, constipation, headache, fevers, chills.  Still reports moderate dyspnea with minimal exertion.  Objective: Vitals:   01/26/20 0618 01/26/20 0758 01/26/20 2349 01/27/20 0814  BP:  128/74 138/75 133/66  Pulse: 84 74 88 79  Resp:  20  16  Temp:  (!) 97.5 F (36.4 C) 98.6 F (37 C) 98.5 F (36.9 C)  TempSrc:  Oral Oral   SpO2: 100% 97%  94%  Weight:      Height:       No intake or output data in the 24 hours ending 01/27/20 1410 Filed Weights   01/22/20 1036 01/23/20 0443  Weight: 111 kg 110.9 kg    Examination: General:  Pleasantly resting in bed, No acute distress. HEENT:  Normocephalic atraumatic.  Sclerae nonicteric, noninjected.  Extraocular movements intact bilaterally. Neck:  Without mass or deformity.  Trachea is midline. Lungs: End expiratory phase wheezing bilaterally left greater than right, diminished breath sounds globally without overt rhonchi or rales Heart:  Regular rate and rhythm.  Without murmurs, rubs, or gallops. Abdomen:  Soft, nontender, nondistended.  Without guarding or rebound. Extremities: Without cyanosis, clubbing, edema, or obvious deformity. Vascular:  Dorsalis pedis and posterior tibial pulses palpable bilaterally. Skin:  Warm and dry, no erythema, no ulcerations.  Data Reviewed: I have personally reviewed following labs and imaging studies  CBC: Recent Labs  Lab 01/22/20 1209 01/22/20 1209 01/23/20 0233 01/23/20 0519 01/24/20 0337 01/26/20 0815 01/27/20 0455  WBC 11.5*  --  10.5  --  10.2 9.1 9.6  NEUTROABS 7.8*  --   --   --   --   --   --   HGB 16.2   < > 15.2 16.3 15.7 14.8 14.2  HCT 56.0*   < > 50.9 48.0 52.6* 52.5* 48.9  MCV 98.6  --  93.2  --  94.1 98.3 96.1  PLT 224  --  229  --  224 175 163   < > = values in this interval not displayed.   Basic Metabolic Panel: Recent Labs  Lab  01/22/20 1209 01/22/20 1209 01/23/20 0233 01/23/20 0519 01/24/20 0337 01/26/20 0815 01/27/20 0455  NA 139   < > 140 140 142 141 141  K 4.3   < > 4.2 3.8 4.6 4.8 4.5  CL 87*  --  94*  --  99 92* 93*  CO2 44*  --  33*  --  34* 43* 41*  GLUCOSE 100*  --  123*  --  181* 115* 115*  BUN 28*  --  18  --  19 11 10   CREATININE 0.90  --  0.83  --  0.94 0.70 0.68  CALCIUM 8.9  --  8.7*  --  8.8* 8.6* 8.5*  MG  --   --  1.7  --   --   --   --   PHOS  --   --  2.1*  --  4.1  --   --    < > = values in this interval not displayed.   GFR: Estimated Creatinine Clearance: 123.6 mL/min (by C-G formula based on SCr of 0.68 mg/dL). Liver Function Tests: Recent Labs  Lab 01/22/20 1209  AST 29  ALT 49*  ALKPHOS 65  BILITOT 0.8  PROT 7.1  ALBUMIN 3.7   No results for input(s): LIPASE, AMYLASE in the last 168 hours. Recent Labs  Lab 01/22/20  1209  AMMONIA 35   Coagulation Profile: No results for input(s): INR, PROTIME in the last 168 hours. Cardiac Enzymes: No results for input(s): CKTOTAL, CKMB, CKMBINDEX, TROPONINI in the last 168 hours. BNP (last 3 results) No results for input(s): PROBNP in the last 8760 hours. HbA1C: No results for input(s): HGBA1C in the last 72 hours. CBG: Recent Labs  Lab 01/23/20 1803 01/23/20 1925 01/23/20 2300 01/24/20 0337 01/24/20 0715  GLUCAP 142* 109* 133* 175* 183*   Lipid Profile: No results for input(s): CHOL, HDL, LDLCALC, TRIG, CHOLHDL, LDLDIRECT in the last 72 hours. Thyroid Function Tests: No results for input(s): TSH, T4TOTAL, FREET4, T3FREE, THYROIDAB in the last 72 hours. Anemia Panel: No results for input(s): VITAMINB12, FOLATE, FERRITIN, TIBC, IRON, RETICCTPCT in the last 72 hours. Sepsis Labs: Recent Labs  Lab 01/22/20 1432 01/22/20 1733 01/24/20 0337  PROCALCITON  --   --  <0.10  LATICACIDVEN 0.8 1.3  --     Recent Results (from the past 240 hour(s))  Respiratory Panel by RT PCR (Flu A&B, Covid) - Nasopharyngeal Swab      Status: None   Collection Time: 01/22/20 11:58 AM   Specimen: Nasopharyngeal Swab  Result Value Ref Range Status   SARS Coronavirus 2 by RT PCR NEGATIVE NEGATIVE Final    Comment: (NOTE) SARS-CoV-2 target nucleic acids are NOT DETECTED. The SARS-CoV-2 RNA is generally detectable in upper respiratoy specimens during the acute phase of infection. The lowest concentration of SARS-CoV-2 viral copies this assay can detect is 131 copies/mL. A negative result does not preclude SARS-Cov-2 infection and should not be used as the sole basis for treatment or other patient management decisions. A negative result may occur with  improper specimen collection/handling, submission of specimen other than nasopharyngeal swab, presence of viral mutation(s) within the areas targeted by this assay, and inadequate number of viral copies (<131 copies/mL). A negative result must be combined with clinical observations, patient history, and epidemiological information. The expected result is Negative. Fact Sheet for Patients:  PinkCheek.be Fact Sheet for Healthcare Providers:  GravelBags.it This test is not yet ap proved or cleared by the [place] FDA and  has been authorized for detection and/or diagnosis of SARS-CoV-2 by FDA under an Emergency Use Authorization (EUA). This EUA will remain  in effect (meaning this test can be used) for the duration of the COVID-19 declaration under Section 564(b)(1) of the Act, 21 U.S.C. section 360bbb-3(b)(1), unless the authorization is terminated or revoked sooner.    Influenza A by PCR NEGATIVE NEGATIVE Final   Influenza B by PCR NEGATIVE NEGATIVE Final    Comment: (NOTE) The Xpert Xpress SARS-CoV-2/FLU/RSV assay is intended as an aid in  the diagnosis of influenza from Nasopharyngeal swab specimens and  should not be used as a sole basis for treatment. Nasal washings and  aspirates are unacceptable for  Xpert Xpress SARS-CoV-2/FLU/RSV  testing. Fact Sheet for Patients: PinkCheek.be Fact Sheet for Healthcare Providers: GravelBags.it This test is not yet approved or cleared by the [place] FDA and  has been authorized for detection and/or diagnosis of SARS-CoV-2 by  FDA under an Emergency Use Authorization (EUA). This EUA will remain  in effect (meaning this test can be used) for the duration of the  Covid-19 declaration under Section 564(b)(1) of the Act, 21  U.S.C. section 360bbb-3(b)(1), unless the authorization is  terminated or revoked. Performed at [facility], [address], [place], [place] [address]   Blood culture (routine x 2)  Status: None   Collection Time: 01/22/20  2:32 PM   Specimen: Right Antecubital; Blood  Result Value Ref Range Status   Specimen Description RIGHT ANTECUBITAL  Final   Special Requests   Final    BOTTLES DRAWN AEROBIC AND ANAEROBIC Blood Culture adequate volume   Culture   Final    NO GROWTH 5 DAYS Performed at [facility], [address] [place], [place] [address]    Report Status 01/27/2020 FINAL  Final  Blood culture (routine x 2)     Status: None   Collection Time: 01/22/20  3:46 PM   Specimen: Right Antecubital; Blood  Result Value Ref Range Status   Specimen Description RIGHT ANTECUBITAL  Final   Special Requests   Final    BOTTLES DRAWN AEROBIC ONLY Blood Culture results may not be optimal due to an inadequate volume of blood received in culture bottles   Culture   Final    NO GROWTH 5 DAYS Performed at [facility], [address] [place], [place] [address]    Report Status 01/27/2020 FINAL  Final  Urine culture     Status: Abnormal   Collection Time: 01/22/20  4:33 PM   Specimen: In/Out Cath Urine  Result Value Ref Range Status   Specimen Description   Final    IN/OUT CATH URINE Performed at [facility], [address], [place], [place] [address]    Special  Requests   Final    NONE Performed at [facility], [address], [place], [place] [address]    Culture (A)  Final    20,000 COLONIES/mL STREPTOCOCCUS GALLOLYTICUS Standardized susceptibility testing for this organism is not available. Performed at [facility], [place] [address] [place], [place] [address]    Report Status 01/23/2020 FINAL  Final  Culture, respiratory (non-expectorated)     Status: None   Collection Time: 01/23/20  3:35 AM   Specimen: Tracheal Aspirate; Respiratory  Result Value Ref Range Status   Specimen Description TRACHEAL ASPIRATE  Final   Special Requests NONE  Final   Gram Stain   Final    MODERATE WBC PRESENT, PREDOMINANTLY PMN RARE GRAM POSITIVE COCCI    Culture   Final    Consistent with normal respiratory flora. Performed at [facility], [place] [address], [place], [place] [address]    Report Status 01/25/2020 FINAL  Final    Radiology Studies: No results found.  Scheduled Meds: . Chlorhexidine Gluconate Cloth  6 each Topical Daily  . enoxaparin (LOVENOX) injection  55 mg Subcutaneous Daily  . mouth rinse  15 mL Mouth Rinse BID  . predniSONE  40 mg Oral Q breakfast   Continuous Infusions:   LOS: 5 days   Time spent: 12min  Maryana Pittmon C Benny Deutschman, DO [facility]  If 7PM-7AM, please contact night-coverage [email or web address]  01/27/2020, 2:10 PM

## 2020-01-27 NOTE — Progress Notes (Signed)
Physical Therapy Treatment Patient Details Name: Miguel Marsh MRN: [id number] DOB: Jun 08, 1961 Today's Date: 01/27/2020    History of Present Illness Pt is a 59 year old man admitted 01/22/20 with hypercarbaric respiratory failure requiring intubation, extubated 01/23/20, pt also requiring vasopressor support. PMH: lung ca with mets, COPD on 2L home 02, HTN.     PT Comments    Pt admitted with above diagnosis. Pt was able to ambulate with supervision with 3LO2 to keep sats >90%.  Pt incr distance, needs standing rest breaks and cues for pursed lip breathing.  Pt currently with functional limitations due to balance and endurance deficits. Pt will benefit from skilled PT to increase their independence and safety with mobility to allow discharge to the venue listed below.     Follow Up Recommendations  No PT follow up     Equipment Recommendations  None recommended by PT    Recommendations for Other Services       Precautions / Restrictions Precautions Precautions: Other (comment) Precaution Comments: monitor vitals/DOE Restrictions Weight Bearing Restrictions: No    Mobility  Bed Mobility Overal bed mobility: Modified Independent             General bed mobility comments: HOB elevated, use of bed rail  Transfers Overall transfer level: Needs assistance Equipment used: None Transfers: Sit to/from Stand Sit to Stand: Supervision            Ambulation/Gait Ambulation/Gait assistance: Supervision Gait Distance (Feet): 450 Feet Assistive device: None Gait Pattern/deviations: Step-through pattern;Staggering left;Staggering right   Gait velocity interpretation: 1.31 - 2.62 ft/sec, indicative of limited community ambulator General Gait Details: No LOB. Pt pushed the O2 carrier. Cues for pursed lip breathing.  Pt was able to ambulate with standing rest breaks with 3LO2 wiht gait to keep sats >90%.    Stairs             Wheelchair Mobility    Modified  [place] (Stroke Patients Only)       Balance Overall balance assessment: Needs assistance Sitting-balance support: Feet supported Sitting balance-Leahy Scale: Good     Standing balance support: No upper extremity supported Standing balance-Leahy Scale: Good                              Cognition Arousal/Alertness: Awake/alert Behavior During Therapy: WFL for tasks assessed/performed Overall Cognitive Status: Within Functional Limits for tasks assessed                                 General Comments: Pt pleasant and willing to participate in therapy      Exercises      General Comments General comments (skin integrity, edema, etc.): Pt on 2LO2 at rest and 3L with activity.        Pertinent Vitals/Pain Pain Assessment: No/denies pain    Home Living                      Prior Function            PT Goals (current goals can now be found in the care plan section) Acute Rehab PT Goals Patient Stated Goal: to go home Progress towards PT goals: Progressing toward goals    Frequency    Min 3X/week      PT Plan Current plan remains appropriate    Co-evaluation  AM-PAC PT "6 Clicks" Mobility   Outcome Measure  Help needed turning from your back to your side while in a flat bed without using bedrails?: None Help needed moving from lying on your back to sitting on the side of a flat bed without using bedrails?: None Help needed moving to and from a bed to a chair (including a wheelchair)?: A Little Help needed standing up from a chair using your arms (e.g., wheelchair or bedside chair)?: A Little Help needed to walk in hospital room?: A Little Help needed climbing 3-5 steps with a railing? : A Little 6 Click Score: 20    End of Session Equipment Utilized During Treatment: Oxygen;Gait belt Activity Tolerance: Patient limited by fatigue;Other (comment)(limited by DOE) Patient left: in bed;with call bell/phone  within reach Nurse Communication: Mobility status PT Visit Diagnosis: Muscle weakness (generalized) (M62.81);Difficulty in walking, not elsewhere classified (R26.2)     Time: 8588-5027 PT Time Calculation (min) (ACUTE ONLY): 10 min  Charges:  $Gait Training: 8-22 mins                     Merrit Friesen W,PT [place] Pager:  [phone]  Office:  [place] 01/27/2020, 11:59 AM

## 2020-01-27 NOTE — Progress Notes (Signed)
Occupational Therapy Treatment Patient Details Name: Miguel Marsh MRN: [id number] DOB: 05/06/1961 Today's Date: 01/27/2020    History of present illness Pt is a 59 year old man admitted 01/22/20 with hypercarbaric respiratory failure requiring intubation, extubated 01/23/20, pt also requiring vasopressor support. PMH: lung ca with mets, COPD on 2L home 02, HTN.    OT comments  Pt making great progress in therapy, demonstrating improved activity tolerance as well as requiring less supplemental oxygen. Pt currently on 2L [place] as compared to 3L [place] previous session. Continued education with pt on energy conservation strategies, noting limited recall from previous session. Pt able to ambulate to/from bathroom with RW and supervision, noting 0 instances of LOB. Pt completed toileting task, able to stand 5 min at the sink to complete grooming/hygiene tasks. SpO2 dropped to 89% on 2L [place] during activity with pt reporting min SOB. All questions/concerns answered at this time. OT will continue to follow acutely.    Follow Up Recommendations  No OT follow up;Supervision - Intermittent    Equipment Recommendations  None recommended by OT    Recommendations for Other Services      Precautions / Restrictions Precautions Precautions: Other (comment) Precaution Comments: monitor vitals/DOE Restrictions Weight Bearing Restrictions: No       Mobility Bed Mobility Overal bed mobility: Modified Independent             General bed mobility comments: HOB elevated, use of bed rail  Transfers Overall transfer level: Needs assistance Equipment used: Rolling walker (2 wheeled) Transfers: Sit to/from Stand Sit to Stand: Supervision         General transfer comment: To ensure balance and safety with O2 line    Balance Overall balance assessment: Mild deficits observed, not formally tested Sitting-balance support: Feet supported Sitting balance-Leahy Scale: Good     Standing balance  support: No upper extremity supported Standing balance-Leahy Scale: Good                             ADL either performed or assessed with clinical judgement   ADL Overall ADL's : Needs assistance/impaired     Grooming: Oral care;Wash/dry hands;Wash/dry face;Set up;Supervision/safety;Standing Grooming Details (indicate cue type and reason): While standing at the sink. 0 instances of LOB.                  Toilet Transfer: Supervision/safety;Grab Haematologist;Ambulation   Toileting- Clothing Manipulation and Hygiene: Supervision/safety;Sit to/from stand     Tub/Shower Transfer Details (indicate cue type and reason): Discussed safety strategies with transfer task. Pt reports that he has a tub transfer bench at home that he uses and he never transfers by himself. Pt has an aide that comes 4hrs/day.  Functional mobility during ADLs: Supervision/safety;Rolling walker General ADL Comments: Pt able to ambulate to/from bathroom with RW and supervision. Pt tolerated standing at the sink 5 min to complete grooming/hygiene tasks with supervision. 0 instances of LOB.      Vision       Perception     Praxis      Cognition Arousal/Alertness: Awake/alert Behavior During Therapy: WFL for tasks assessed/performed Overall Cognitive Status: Within Functional Limits for tasks assessed                                 General Comments: Pt pleasant and willing to participate in therapy  Exercises     Shoulder Instructions       General Comments Pt on 2L [place] with SpO2 in 90s at rest. SpO2 dropped to 89% during activity with pt reporting min SOB.     Pertinent Vitals/ Pain       Pain Assessment: No/denies pain  Home Living                                          Prior Functioning/Environment              Frequency           Progress Toward Goals  OT Goals(current goals can now be found in the care plan  section)  Progress towards OT goals: Progressing toward goals  Acute Rehab OT Goals Patient Stated Goal: to go home ADL Goals Pt Will Perform Grooming: with modified independence;standing Pt Will Perform Lower Body Bathing: with modified independence;with adaptive equipment;sit to/from stand Pt Will Perform Lower Body Dressing: with modified independence;with adaptive equipment;sit to/from stand Pt Will Transfer to Toilet: with modified independence;ambulating;regular height toilet Pt Will Perform Toileting - Clothing Manipulation and hygiene: with modified independence;sit to/from stand Additional ADL Goal #1: Pt will state at least 3 energy conservation strategies and demonstrate understanding of pursed lip breathing technique.  Plan Discharge plan remains appropriate    Co-evaluation                 AM-PAC OT "6 Clicks" Daily Activity     Outcome Measure   Help from another person eating meals?: None Help from another person taking care of personal grooming?: A Little Help from another person toileting, which includes using toliet, bedpan, or urinal?: A Little Help from another person bathing (including washing, rinsing, drying)?: A Little Help from another person to put on and taking off regular upper body clothing?: None Help from another person to put on and taking off regular lower body clothing?: A Little 6 Click Score: 20    End of Session Equipment Utilized During Treatment: Gait belt;Rolling walker;Oxygen  OT Visit Diagnosis: Unsteadiness on feet (R26.81);Muscle weakness (generalized) (M62.81)   Activity Tolerance Patient tolerated treatment well   Patient Left in bed;with call bell/phone within reach   Nurse Communication Mobility status        Time: 9179-1505 OT Time Calculation (min): 22 min  Charges: OT General Charges $OT Visit: 1 Visit OT Treatments $Therapeutic Activity: 8-22 mins  Mauri [place] OTR/L [phone]   Mauri [place] 01/27/2020, 2:40 PM

## 2020-01-28 LAB — CBC
HCT: 49.6 % (ref 39.0–52.0)
Hemoglobin: 14.7 g/dL (ref 13.0–17.0)
MCH: 27.7 pg (ref 26.0–34.0)
MCHC: 29.6 g/dL — ABNORMAL LOW (ref 30.0–36.0)
MCV: 93.4 fL (ref 80.0–100.0)
Platelets: 184 10*3/uL (ref 150–400)
RBC: 5.31 MIL/uL (ref 4.22–5.81)
RDW: 13.8 % (ref 11.5–15.5)
WBC: 10.3 10*3/uL (ref 4.0–10.5)
nRBC: 0 % (ref 0.0–0.2)

## 2020-01-28 LAB — BASIC METABOLIC PANEL
Anion gap: 7 (ref 5–15)
BUN: 9 mg/dL (ref 6–20)
CO2: 43 mmol/L — ABNORMAL HIGH (ref 22–32)
Calcium: 8.9 mg/dL (ref 8.9–10.3)
Chloride: 90 mmol/L — ABNORMAL LOW (ref 98–111)
Creatinine, Ser: 0.67 mg/dL (ref 0.61–1.24)
GFR calc Af Amer: >60 mL/min (ref >60)
GFR calc non Af Amer: >60 mL/min (ref >60)
Glucose, Bld: 128 mg/dL — ABNORMAL HIGH (ref 70–99)
Potassium: 4.1 mmol/L (ref 3.5–5.1)
Sodium: 140 mmol/L (ref 135–145)

## 2020-01-28 NOTE — Plan of Care (Signed)
Pt is adequate for discharge per order.

## 2020-01-28 NOTE — Discharge Summary (Signed)
Discharge Summary  Miguel Marsh TSV:[id number] DOB: [birth year]  PCP: Scotty Court, DO  Admit date: 01/22/2020 Discharge date: 01/28/2020  Time spent: 40 mins  Recommendations for Outpatient Follow-up:  PCP in 1 week   Discharge Diagnoses:  Active Hospital Problems   Diagnosis Date Noted  . Acute on chronic respiratory failure with hypoxia and hypercapnia ([place]) 01/23/2018  . Respiratory failure ([place]) 01/22/2020  . Non-small cell cancer of right lung ([place]) 12/09/2015  . Essential hypertension 12/09/2015  . COPD with acute exacerbation ([place]) 12/17/2014  . Tobacco use disorder 12/17/2014    Resolved Hospital Problems  No resolved problems to display.    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 Lives alone and denied any known COVID exposures. Patient stated his symptoms felt like his prior COPD exacerbations. In ER, patient very somnolent but non focal. Temp 95.4, with normotensive, sinus rhythm and saturations 92% on his 2L nasal cannula. Labs noted for WBC 11.5, Cl 87, CO2 44, BUN 28, trop HS 88- >82, lactic acid 0.8, UA negative, CXR showed mild atelectasis of left lung base. CT head was normal. ABG showed severe respiratory acidosis with PH 7.17/ CO2 >120. After COVID test was negative, patient placed on BiPAP however, poor improvement requiring intubation for airway protection. Post intubation, patient hypotensive requiring peripheral vasopressor support.  Patient transferred to [facility] for higher level of care. Admitted to the ICU on 01/22/2020, successfully extubated on 01/23/2020 -3 L nasal cannula, transition to p.o. steroids and antibiotics, subsequently discharged to the medical floor.   Today, patient denies any new complaints.  Reports breathing is back to baseline, denies any worsening shortness of breath, chest pain, abdominal pain, nausea/vomiting, fever/chills.  Advised patient to quit smoking cigarettes, use inhalers/nebulizers as recommended, follow-up with PCP in 1 week.  Patient has oxygen at home, advised to increase to 3 L for now and check oxygen saturations as patient has a pulse oximeter at home.  Patient eager to be discharged   Hospital Course:  Principal Problem:   Acute on chronic respiratory failure with hypoxia and hypercapnia (HCC) Active Problems:   COPD with acute exacerbation (HCC)   Tobacco use disorder   Essential hypertension   Non-small cell cancer of right lung (HCC)   Respiratory failure (HCC)   Acute on chronic hypoxic hypercapnic respiratory failure in the setting of COPD exacerbation, POA, resolving Concurrent tobacco abuse, ongoing -Patient intubated 01/22/2020, successfully extubated 01/23/2020 -Completed antibiotics and steroids -Continue supplemental oxygen, home nebulizers, inhalers -Follow-up with PCP  Non-small cell lung cancer, metastatic -Status post 2 years of immunotherapy at [facility], follow-up with oncology as scheduled, most recent evaluation in December of last year with CT reported to be unremarkable per patient for any ongoing active disease -Continue supportive care as above  Transient overnight hypoxia concerning for sleep apnea, ??undiagnosed  -Encourage patient to seek outpatient evaluation and treatment, given body habitus, baseline hypoxia patient is at high risk for  desaturating overnight even with minimal sleep apnea  Transient hypotension, without shock History of  hypertension Resolved -Likely in the setting of sedation medication, procalcitonin negative, no acute infectious process, blood pressure now well controlled off sedation -Follow-up with PCP -Resume HCTZ  Obesity Lifestyle modification advised   Estimated body mass index is 36.1 kg/m as calculated from the following:   Height as of this encounter: 5\' 9"  (1.753 m).   Weight as of this encounter: 110.9 kg.    Procedures:  Mechanical ventilation  Consultations:  PCCM  Discharge Exam: BP (!) 143/71 (BP Location: Right Arm)   Pulse 82   Temp 98.1 F (36.7 C) (Oral)   Resp 19   Ht 5\' 9"  (1.753 m) Comment: measured x 3  Wt 110.9 kg   SpO2 92%   BMI 36.10 kg/m   General: NAD Cardiovascular: S1, S2 present Respiratory: Diminished breath sounds bilaterally  Discharge Instructions You were cared for by a hospitalist during your hospital stay. If you have any questions about your discharge medications or the care you received while you were in the hospital after you are discharged, you can call the unit and asked to speak with the hospitalist on call if the hospitalist that took care of you is not available. Once you are discharged, your primary care physician will handle any further medical issues. Please note that NO REFILLS for any discharge medications will be authorized once you are discharged, as it is imperative that you return to your primary care physician (or establish a relationship with a primary care physician if you do not have one) for your aftercare needs so that they can reassess your need for medications and monitor your lab values.  Discharge Instructions    Diet - low sodium heart healthy   Complete by: As directed    Increase activity slowly   Complete by: As directed      Allergies as of 01/28/2020   No Known Allergies     Medication List    STOP taking these medications   ibuprofen 600 MG tablet Commonly known as: ADVIL     TAKE these medications     albuterol 108 (90 Base) MCG/ACT inhaler Commonly known as: VENTOLIN HFA Inhale 2 puffs into the lungs every 6 (six) hours as needed for wheezing.   albuterol (2.5 MG/3ML) 0.083% nebulizer solution Commonly known as: PROVENTIL Inhale 2.5 mg into the lungs every 6 (six) hours as needed for wheezing or shortness of breath.   guaiFENesin 600 MG 12 hr tablet Commonly known as: MUCINEX Take 1 tablet (600 mg total) by mouth 2 (two) times daily. What changed:   when to take this  reasons to take this   hydrochlorothiazide 12.5 MG tablet Commonly known as: HYDRODIURIL Take 1 tablet (12.5 mg total) by mouth daily. What changed:   when to take this  reasons to take this   ipratropium-albuterol 0.5-2.5 (3) MG/3ML Soln Commonly known as: DUONEB Take 3 mLs by nebulization every 4 (four) hours as needed. What changed: reasons to take this   naproxen sodium 220 MG tablet Commonly known as: ALEVE Take 220 mg by mouth 2 (two) times daily as needed (pain/headache).   Oxycodone HCl 10 MG Tabs Take 10 mg by mouth every 6 (six) hours as needed (pain).   OXYGEN Inhale 2 L into the lungs continuous.   Trelegy Ellipta 100-62.5-25 MCG/INH Aepb Generic drug: Fluticasone-Umeclidin-Vilant Inhale 1 puff into the lungs daily.  Durable Medical Equipment  (From admission, onward)         Start     Ordered   01/27/20 1118  DME Oxygen  (Discharge Planning)  Once    Comments: Already on 2L around the clock - may need adjustment prior to DC  Question Answer Comment  Length of Need Lifetime   Mode or (Route) Nasal cannula   Oxygen delivery system Gas      01/27/20 1117         No Known Allergies Follow-up Information    Scotty Court, DO. Schedule an appointment as soon as possible for a visit in 1 week(s).   Contact information: [address] [place] [address] [phone]            The results of significant diagnostics from this hospitalization  (including imaging, microbiology, ancillary and laboratory) are listed below for reference.    Significant Diagnostic Studies: CT Head Wo Contrast  Result Date: 01/22/2020 CLINICAL DATA:  Generalized weakness with shortness of breath. Patient unable to give history. EXAM: CT HEAD WITHOUT CONTRAST TECHNIQUE: Contiguous axial images were obtained from the base of the skull through the vertex without intravenous contrast. COMPARISON:  None. FINDINGS: Brain: Ventricles, cisterns and other CSF spaces are within normal. There is no mass, mass effect, shift of midline structures or acute hemorrhage. No evidence of acute infarction. Vascular: No hyperdense vessel or unexpected calcification. Skull: Normal. Negative for fracture or focal lesion. Sinuses/Orbits: No acute finding. Other: None. IMPRESSION: No acute findings. Electronically Signed   By: Marin Olp M.D.   On: 01/22/2020 14:58   DG Chest Port 1 View  Result Date: 01/24/2020 CLINICAL DATA:  Acute respiratory failure EXAM: PORTABLE CHEST 1 VIEW COMPARISON:  01/23/2020 FINDINGS: There is mild right basilar scarring. There is no focal consolidation. There is no pleural effusion or pneumothorax. The heart and mediastinal contours are unremarkable. There is no acute osseous abnormality. IMPRESSION: No active disease. Electronically Signed   By: Kathreen Devoid   On: 01/24/2020 07:49   DG Chest Port 1 View  Result Date: 01/23/2020 CLINICAL DATA:  Respiratory failure EXAM: PORTABLE CHEST 1 VIEW COMPARISON:  Yesterday FINDINGS: Endotracheal tube tip is halfway between the clavicular heads and carina. The orogastric tube at least reaches the stomach. Artifact from EKG leads. Reticulation at the left base from scarring by CT. There is no edema, consolidation, effusion, or pneumothorax. IMPRESSION: 1. Stable hardware positioning. 2. Mild scarring at the left base by prior CT. Otherwise the lungs are symmetric and well inflated. Electronically Signed   By:  Monte Fantasia M.D.   On: 01/23/2020 05:20   DG Chest Portable 1 View  Result Date: 01/22/2020 CLINICAL DATA:  Tube placement.  Possible sepsis. EXAM: PORTABLE CHEST 1 VIEW COMPARISON:  Radiograph earlier this day. Chest CT 07/16/2018 FINDINGS: Endotracheal tube tip is age 73 cm from the carina. Enteric tube in place with tip below the diaphragm not included in the field of view. Unchanged heart size and mediastinal contours. The lungs are hyperinflated with diffuse bronchial thickening. Blunting of the costophrenic angles may be due to effusion versus hyperinflation. Surgical clips project over the left suprahilar region. Minor left basilar atelectasis/scarring. No confluent airspace disease, pneumothorax, or pulmonary edema. IMPRESSION: 1. Endotracheal tube tip 4 cm from the carina. Enteric tube in place with tip below the diaphragm not included in the field of view. 2. Hyperinflation and bronchial thickening. Atelectasis/scarring at the left lung base. Electronically Signed  By: Taner Rake M.D.   On: 01/22/2020 16:48   DG Chest Portable 1 View  Result Date: 01/22/2020 CLINICAL DATA:  Of shortness of breath EXAM: PORTABLE CHEST 1 VIEW COMPARISON:  December 11, 2019 FINDINGS: The mediastinal contour and cardiac silhouette are is normal. Mild atelectasis of left lung base is noted. There is no focal pneumonia, pulmonary edema, or pleural effusion. The bony structures are stable. IMPRESSION: Mild atelectasis of left lung base. Otherwise no acute abnormality. Mom Electronically Signed   By: Abelardo Diesel M.D.   On: 01/22/2020 11:28    Microbiology: Recent Results (from the past 240 hour(s))  Respiratory Panel by RT PCR (Flu A&B, Covid) - Nasopharyngeal Swab     Status: None   Collection Time: 01/22/20 11:58 AM   Specimen: Nasopharyngeal Swab  Result Value Ref Range Status   SARS Coronavirus 2 by RT PCR NEGATIVE NEGATIVE Final    Comment: (NOTE) SARS-CoV-2 target nucleic acids are NOT  DETECTED. The SARS-CoV-2 RNA is generally detectable in upper respiratoy specimens during the acute phase of infection. The lowest concentration of SARS-CoV-2 viral copies this assay can detect is 131 copies/mL. A negative result does not preclude SARS-Cov-2 infection and should not be used as the sole basis for treatment or other patient management decisions. A negative result may occur with  improper specimen collection/handling, submission of specimen other than nasopharyngeal swab, presence of viral mutation(s) within the areas targeted by this assay, and inadequate number of viral copies (<131 copies/mL). A negative result must be combined with clinical observations, patient history, and epidemiological information. The expected result is Negative. Fact Sheet for Patients:  PinkCheek.be Fact Sheet for Healthcare Providers:  GravelBags.it This test is not yet ap proved or cleared by the [place] FDA and  has been authorized for detection and/or diagnosis of SARS-CoV-2 by FDA under an Emergency Use Authorization (EUA). This EUA will remain  in effect (meaning this test can be used) for the duration of the COVID-19 declaration under Section 564(b)(1) of the Act, 21 U.S.C. section 360bbb-3(b)(1), unless the authorization is terminated or revoked sooner.    Influenza A by PCR NEGATIVE NEGATIVE Final   Influenza B by PCR NEGATIVE NEGATIVE Final    Comment: (NOTE) The Xpert Xpress SARS-CoV-2/FLU/RSV assay is intended as an aid in  the diagnosis of influenza from Nasopharyngeal swab specimens and  should not be used as a sole basis for treatment. Nasal washings and  aspirates are unacceptable for Xpert Xpress SARS-CoV-2/FLU/RSV  testing. Fact Sheet for Patients: PinkCheek.be Fact Sheet for Healthcare Providers: GravelBags.it This test is not yet approved or cleared  by the [place] FDA and  has been authorized for detection and/or diagnosis of SARS-CoV-2 by  FDA under an Emergency Use Authorization (EUA). This EUA will remain  in effect (meaning this test can be used) for the duration of the  Covid-19 declaration under Section 564(b)(1) of the Act, 21  U.S.C. section 360bbb-3(b)(1), unless the authorization is  terminated or revoked. Performed at [facility], [address] [place], [place] [address]   Blood culture (routine x 2)     Status: None   Collection Time: 01/22/20  2:32 PM   Specimen: Right Antecubital; Blood  Result Value Ref Range Status   Specimen Description RIGHT ANTECUBITAL  Final   Special Requests   Final    BOTTLES DRAWN AEROBIC AND ANAEROBIC Blood Culture adequate volume   Culture   Final    NO GROWTH 5 DAYS Performed  at [facility], [address], [place], [place] [address]    Report Status 01/27/2020 FINAL  Final  Blood culture (routine x 2)     Status: None   Collection Time: 01/22/20  3:46 PM   Specimen: Right Antecubital; Blood  Result Value Ref Range Status   Specimen Description RIGHT ANTECUBITAL  Final   Special Requests   Final    BOTTLES DRAWN AEROBIC ONLY Blood Culture results may not be optimal due to an inadequate volume of blood received in culture bottles   Culture   Final    NO GROWTH 5 DAYS Performed at [facility], [address] [place], [place] [address]    Report Status 01/27/2020 FINAL  Final  Urine culture     Status: Abnormal   Collection Time: 01/22/20  4:33 PM   Specimen: In/Out Cath Urine  Result Value Ref Range Status   Specimen Description   Final    IN/OUT CATH URINE Performed at [facility], [address], [place], [place] [address]    Special Requests   Final    NONE Performed at [facility], [address], [place], [place] [address]    Culture (A)  Final    20,000 COLONIES/mL STREPTOCOCCUS GALLOLYTICUS Standardized susceptibility testing for this organism is not  available. Performed at [facility], [place] [address] [place], [place] [address]    Report Status 01/23/2020 FINAL  Final  Culture, respiratory (non-expectorated)     Status: None   Collection Time: 01/23/20  3:35 AM   Specimen: Tracheal Aspirate; Respiratory  Result Value Ref Range Status   Specimen Description TRACHEAL ASPIRATE  Final   Special Requests NONE  Final   Gram Stain   Final    MODERATE WBC PRESENT, PREDOMINANTLY PMN RARE GRAM POSITIVE COCCI    Culture   Final    Consistent with normal respiratory flora. Performed at [facility], [place] [address] [place], [place] [address]    Report Status 01/25/2020 FINAL  Final     Labs: Basic Metabolic Panel: Recent Labs  Lab 01/23/20 0233 01/23/20 0233 01/23/20 0519 01/24/20 0337 01/26/20 0815 01/27/20 0455 01/28/20 0525  NA 140   < > 140 142 141 141 140  K 4.2   < > 3.8 4.6 4.8 4.5 4.1  CL 94*  --   --  99 92* 93* 90*  CO2 33*  --   --  34* 43* 41* 43*  GLUCOSE 123*  --   --  181* 115* 115* 128*  BUN 18  --   --  19 11 10 9   CREATININE 0.83  --   --  0.94 0.70 0.68 0.67  CALCIUM 8.7*  --   --  8.8* 8.6* 8.5* 8.9  MG 1.7  --   --   --   --   --   --   PHOS 2.1*  --   --  4.1  --   --   --    < > = values in this interval not displayed.   Liver Function Tests: Recent Labs  Lab 01/22/20 1209  AST 29  ALT 49*  ALKPHOS 65  BILITOT 0.8  PROT 7.1  ALBUMIN 3.7   No results for input(s): LIPASE, AMYLASE in the last 168 hours. Recent Labs  Lab 01/22/20 1209  AMMONIA 35   CBC: Recent Labs  Lab 01/22/20 1209 01/22/20 1209 01/23/20 8546 01/23/20 2703 01/23/20 5009 01/24/20 3818 01/26/20 0815 01/27/20 2993  01/28/20 0525  WBC 11.5*   < > 10.5  --   --  10.2 9.1 9.6 10.3  NEUTROABS 7.8*  --   --   --   --   --   --   --   --   HGB 16.2   < > 15.2   < > 16.3 15.7 14.8 14.2 14.7  HCT 56.0*   < > 50.9   < > 48.0 52.6* 52.5* 48.9 49.6  MCV 98.6   < > 93.2  --   --  94.1 98.3 96.1 93.4  PLT 224    < > 229  --   --  224 175 163 184   < > = values in this interval not displayed.   Cardiac Enzymes: No results for input(s): CKTOTAL, CKMB, CKMBINDEX, TROPONINI in the last 168 hours. BNP: BNP (last 3 results) Recent Labs    12/11/19 1613  BNP 32.0    ProBNP (last 3 results) No results for input(s): PROBNP in the last 8760 hours.  CBG: Recent Labs  Lab 01/23/20 1803 01/23/20 1925 01/23/20 2300 01/24/20 0337 01/24/20 0715  GLUCAP 142* 109* 133* 175* 183*       Signed:  Alma Friendly, MD [facility] 01/28/2020, 12:17 PM

## 2020-04-12 ENCOUNTER — Encounter (HOSPITAL_COMMUNITY): Payer: Self-pay | Admitting: Emergency Medicine

## 2020-04-12 ENCOUNTER — Emergency Department (HOSPITAL_COMMUNITY): Payer: Medicare HMO

## 2020-04-12 ENCOUNTER — Inpatient Hospital Stay (HOSPITAL_COMMUNITY)
Admission: EM | Admit: 2020-04-12 | Discharge: 2020-04-16 | DRG: 190 | Disposition: A | Discharge status: Home / Self Care | Payer: Medicare HMO | Attending: Internal Medicine | Admitting: Internal Medicine

## 2020-04-12 ENCOUNTER — Other Ambulatory Visit: Payer: Self-pay

## 2020-04-12 DIAGNOSIS — G893 Neoplasm related pain (acute) (chronic): Secondary | ICD-10-CM | POA: Diagnosis present

## 2020-04-12 DIAGNOSIS — I119 Hypertensive heart disease without heart failure: Secondary | ICD-10-CM | POA: Diagnosis present

## 2020-04-12 DIAGNOSIS — Z6836 Body mass index (BMI) 36.0-36.9, adult: Secondary | ICD-10-CM | POA: Diagnosis not present

## 2020-04-12 DIAGNOSIS — Z7189 Other specified counseling: Secondary | ICD-10-CM | POA: Diagnosis not present

## 2020-04-12 DIAGNOSIS — Z515 Encounter for palliative care: Secondary | ICD-10-CM

## 2020-04-12 DIAGNOSIS — Z66 Do not resuscitate: Secondary | ICD-10-CM | POA: Diagnosis not present

## 2020-04-12 DIAGNOSIS — Z20822 Contact with and (suspected) exposure to covid-19: Secondary | ICD-10-CM | POA: Diagnosis present

## 2020-04-12 DIAGNOSIS — I10 Essential (primary) hypertension: Secondary | ICD-10-CM | POA: Diagnosis not present

## 2020-04-12 DIAGNOSIS — Z79899 Other long term (current) drug therapy: Secondary | ICD-10-CM | POA: Diagnosis not present

## 2020-04-12 DIAGNOSIS — J9621 Acute and chronic respiratory failure with hypoxia: Secondary | ICD-10-CM | POA: Diagnosis present

## 2020-04-12 DIAGNOSIS — Z96649 Presence of unspecified artificial hip joint: Secondary | ICD-10-CM | POA: Diagnosis present

## 2020-04-12 DIAGNOSIS — E1165 Type 2 diabetes mellitus with hyperglycemia: Secondary | ICD-10-CM | POA: Diagnosis present

## 2020-04-12 DIAGNOSIS — T380X5A Adverse effect of glucocorticoids and synthetic analogues, initial encounter: Secondary | ICD-10-CM | POA: Diagnosis present

## 2020-04-12 DIAGNOSIS — J441 Chronic obstructive pulmonary disease with (acute) exacerbation: Secondary | ICD-10-CM | POA: Diagnosis present

## 2020-04-12 DIAGNOSIS — Z716 Tobacco abuse counseling: Secondary | ICD-10-CM

## 2020-04-12 DIAGNOSIS — J9622 Acute and chronic respiratory failure with hypercapnia: Secondary | ICD-10-CM | POA: Diagnosis present

## 2020-04-12 DIAGNOSIS — J9601 Acute respiratory failure with hypoxia: Secondary | ICD-10-CM

## 2020-04-12 DIAGNOSIS — Z85118 Personal history of other malignant neoplasm of bronchus and lung: Secondary | ICD-10-CM | POA: Diagnosis not present

## 2020-04-12 DIAGNOSIS — E669 Obesity, unspecified: Secondary | ICD-10-CM | POA: Diagnosis present

## 2020-04-12 DIAGNOSIS — X58XXXA Exposure to other specified factors, initial encounter: Secondary | ICD-10-CM | POA: Diagnosis present

## 2020-04-12 DIAGNOSIS — R0602 Shortness of breath: Secondary | ICD-10-CM

## 2020-04-12 DIAGNOSIS — C3491 Malignant neoplasm of unspecified part of right bronchus or lung: Secondary | ICD-10-CM | POA: Diagnosis not present

## 2020-04-12 DIAGNOSIS — K219 Gastro-esophageal reflux disease without esophagitis: Secondary | ICD-10-CM | POA: Diagnosis present

## 2020-04-12 DIAGNOSIS — F17210 Nicotine dependence, cigarettes, uncomplicated: Secondary | ICD-10-CM | POA: Diagnosis present

## 2020-04-12 DIAGNOSIS — Z9981 Dependence on supplemental oxygen: Secondary | ICD-10-CM | POA: Diagnosis not present

## 2020-04-12 DIAGNOSIS — R739 Hyperglycemia, unspecified: Secondary | ICD-10-CM | POA: Diagnosis present

## 2020-04-12 DIAGNOSIS — R0600 Dyspnea, unspecified: Secondary | ICD-10-CM | POA: Diagnosis present

## 2020-04-12 LAB — HEMOGLOBIN A1C
Hgb A1c MFr Bld: 6.6 % — ABNORMAL HIGH (ref 4.8–5.6)
Mean Plasma Glucose: 142.72 mg/dL

## 2020-04-12 LAB — CBC WITH DIFFERENTIAL/PLATELET
Abs Immature Granulocytes: 0.04 10*3/uL (ref 0.00–0.07)
Basophils Absolute: 0 10*3/uL (ref 0.0–0.1)
Basophils Relative: 0 %
Eosinophils Absolute: 0.1 10*3/uL (ref 0.0–0.5)
Eosinophils Relative: 1 %
HCT: 53.7 % — ABNORMAL HIGH (ref 39.0–52.0)
Hemoglobin: 15.9 g/dL (ref 13.0–17.0)
Immature Granulocytes: 0 %
Lymphocytes Relative: 19 %
Lymphs Abs: 1.8 10*3/uL (ref 0.7–4.0)
MCH: 28 pg (ref 26.0–34.0)
MCHC: 29.6 g/dL — ABNORMAL LOW (ref 30.0–36.0)
MCV: 94.5 fL (ref 80.0–100.0)
Monocytes Absolute: 1.1 10*3/uL — ABNORMAL HIGH (ref 0.1–1.0)
Monocytes Relative: 12 %
Neutro Abs: 6.5 10*3/uL (ref 1.7–7.7)
Neutrophils Relative %: 68 %
Platelets: 224 10*3/uL (ref 150–400)
RBC: 5.68 MIL/uL (ref 4.22–5.81)
RDW: 14.6 % (ref 11.5–15.5)
WBC: 9.5 10*3/uL (ref 4.0–10.5)
nRBC: 0 % (ref 0.0–0.2)

## 2020-04-12 LAB — BASIC METABOLIC PANEL
Anion gap: 7 (ref 5–15)
BUN: 11 mg/dL (ref 6–20)
CO2: 35 mmol/L — ABNORMAL HIGH (ref 22–32)
Calcium: 8.7 mg/dL — ABNORMAL LOW (ref 8.9–10.3)
Chloride: 96 mmol/L — ABNORMAL LOW (ref 98–111)
Creatinine, Ser: 0.94 mg/dL (ref 0.61–1.24)
GFR calc Af Amer: >60 mL/min (ref >60)
GFR calc non Af Amer: >60 mL/min (ref >60)
Glucose, Bld: 122 mg/dL — ABNORMAL HIGH (ref 70–99)
Potassium: 4.4 mmol/L (ref 3.5–5.1)
Sodium: 138 mmol/L (ref 135–145)

## 2020-04-12 LAB — SARS CORONAVIRUS 2 BY RT PCR (HOSPITAL ORDER, PERFORMED IN ~~LOC~~ HOSPITAL LAB): SARS Coronavirus 2: NEGATIVE

## 2020-04-12 LAB — BLOOD GAS, VENOUS
Acid-Base Excess: 12.6 mmol/L — ABNORMAL HIGH (ref 0.0–2.0)
Bicarbonate: 31.1 mmol/L — ABNORMAL HIGH (ref 20.0–28.0)
FIO2: 32
O2 Saturation: 50.9 %
Patient temperature: 37
pCO2, Ven: 82.4 mmHg (ref 44.0–60.0)
pH, Ven: 7.3 (ref 7.250–7.430)
pO2, Ven: <31 mmHg — CL (ref 32.0–45.0)

## 2020-04-12 LAB — MRSA PCR SCREENING: MRSA by PCR: NEGATIVE

## 2020-04-12 LAB — GLUCOSE, CAPILLARY
Glucose-Capillary: 243 mg/dL — ABNORMAL HIGH (ref 70–99)
Glucose-Capillary: 138 mg/dL — ABNORMAL HIGH (ref 70–99)

## 2020-04-12 LAB — BRAIN NATRIURETIC PEPTIDE: B Natriuretic Peptide: 33 pg/mL (ref 0.0–100.0)

## 2020-04-12 MED ORDER — METHYLPREDNISOLONE SODIUM SUCC 125 MG IJ SOLR
60.0000 mg | Freq: Four times a day (QID) | INTRAMUSCULAR | Status: DC
  Administered 2020-04-12 – 2020-04-14 (×6): 60 mg via INTRAVENOUS
  Filled 2020-04-12 (×7): qty 2

## 2020-04-12 MED ORDER — SODIUM CHLORIDE 0.9 % IV SOLN
100.0000 mg | Freq: Two times a day (BID) | INTRAVENOUS | Status: DC
  Administered 2020-04-12 – 2020-04-14 (×4): 100 mg via INTRAVENOUS
  Filled 2020-04-12 (×8): qty 100

## 2020-04-12 MED ORDER — ONDANSETRON HCL 4 MG/2ML IJ SOLN: 4.0000 mg | Freq: Four times a day (QID) | INTRAMUSCULAR | Status: DC | PRN

## 2020-04-12 MED ORDER — FLUTICASONE FUROATE-VILANTEROL 100-25 MCG/INH IN AEPB
1.0000 | INHALATION_SPRAY | Freq: Every day | RESPIRATORY_TRACT | Status: DC
  Administered 2020-04-13 – 2020-04-14 (×2): 1 via RESPIRATORY_TRACT
  Filled 2020-04-12: qty 28

## 2020-04-12 MED ORDER — ALBUTEROL SULFATE HFA 108 (90 BASE) MCG/ACT IN AERS
5.0000 | INHALATION_SPRAY | RESPIRATORY_TRACT | Status: DC | PRN
  Administered 2020-04-12: 5 via RESPIRATORY_TRACT
  Filled 2020-04-12: qty 6.7

## 2020-04-12 MED ORDER — ACETAMINOPHEN 325 MG PO TABS: 650.0000 mg | ORAL_TABLET | Freq: Four times a day (QID) | ORAL | Status: DC | PRN

## 2020-04-12 MED ORDER — ALBUTEROL SULFATE (2.5 MG/3ML) 0.083% IN NEBU
2.5000 mg | INHALATION_SOLUTION | Freq: Once | RESPIRATORY_TRACT | Status: AC
  Administered 2020-04-12: 2.5 mg via RESPIRATORY_TRACT
  Filled 2020-04-12: qty 3

## 2020-04-12 MED ORDER — CHLORHEXIDINE GLUCONATE CLOTH 2 % EX PADS
6.0000 | MEDICATED_PAD | Freq: Every day | CUTANEOUS | Status: DC
  Administered 2020-04-12 – 2020-04-14 (×3): 6 via TOPICAL

## 2020-04-12 MED ORDER — METHYLPREDNISOLONE SODIUM SUCC 125 MG IJ SOLR
125.0000 mg | Freq: Once | INTRAMUSCULAR | Status: AC
  Administered 2020-04-12: 125 mg via INTRAVENOUS
  Filled 2020-04-12: qty 2

## 2020-04-12 MED ORDER — ORAL CARE MOUTH RINSE
15.0000 mL | Freq: Two times a day (BID) | OROMUCOSAL | Status: DC
  Administered 2020-04-12 – 2020-04-14 (×5): 15 mL via OROMUCOSAL

## 2020-04-12 MED ORDER — ENOXAPARIN SODIUM 40 MG/0.4ML ~~LOC~~ SOLN
40.0000 mg | SUBCUTANEOUS | Status: DC
  Administered 2020-04-12 – 2020-04-15 (×4): 40 mg via SUBCUTANEOUS
  Filled 2020-04-12 (×4): qty 0.4

## 2020-04-12 MED ORDER — HYDRALAZINE HCL 20 MG/ML IJ SOLN: 5.0000 mg | INTRAMUSCULAR | Status: DC | PRN

## 2020-04-12 MED ORDER — NICOTINE 21 MG/24HR TD PT24
21.0000 mg | MEDICATED_PATCH | Freq: Every day | TRANSDERMAL | Status: DC
  Administered 2020-04-12 – 2020-04-13 (×2): 21 mg via TRANSDERMAL
  Filled 2020-04-12 (×3): qty 1

## 2020-04-12 MED ORDER — OXYCODONE HCL 5 MG PO TABS
5.0000 mg | ORAL_TABLET | Freq: Four times a day (QID) | ORAL | Status: DC | PRN
  Administered 2020-04-12 – 2020-04-16 (×6): 5 mg via ORAL
  Filled 2020-04-12 (×6): qty 1

## 2020-04-12 MED ORDER — INSULIN ASPART 100 UNIT/ML ~~LOC~~ SOLN
0.0000 [IU] | Freq: Every day | SUBCUTANEOUS | Status: DC
  Administered 2020-04-12 – 2020-04-14 (×2): 2 [IU] via SUBCUTANEOUS
  Administered 2020-04-15: 4 [IU] via SUBCUTANEOUS

## 2020-04-12 MED ORDER — INSULIN ASPART 100 UNIT/ML ~~LOC~~ SOLN
0.0000 [IU] | Freq: Three times a day (TID) | SUBCUTANEOUS | Status: DC
  Administered 2020-04-12: 5 [IU] via SUBCUTANEOUS
  Administered 2020-04-13: 3 [IU] via SUBCUTANEOUS
  Administered 2020-04-13: 2 [IU] via SUBCUTANEOUS
  Administered 2020-04-13 – 2020-04-14 (×2): 3 [IU] via SUBCUTANEOUS
  Administered 2020-04-14: 2 [IU] via SUBCUTANEOUS
  Administered 2020-04-14: 3 [IU] via SUBCUTANEOUS
  Administered 2020-04-15: 11 [IU] via SUBCUTANEOUS
  Administered 2020-04-15: 1 [IU] via SUBCUTANEOUS
  Administered 2020-04-15: 5 [IU] via SUBCUTANEOUS
  Administered 2020-04-16: 8 [IU] via SUBCUTANEOUS

## 2020-04-12 MED ORDER — ZOLPIDEM TARTRATE 5 MG PO TABS
5.0000 mg | ORAL_TABLET | Freq: Every evening | ORAL | Status: DC | PRN
  Administered 2020-04-12: 5 mg via ORAL
  Filled 2020-04-12: qty 1

## 2020-04-12 MED ORDER — FLUTICASONE-UMECLIDIN-VILANT 100-62.5-25 MCG/INH IN AEPB: 1.0000 | INHALATION_SPRAY | Freq: Every day | RESPIRATORY_TRACT | Status: DC

## 2020-04-12 MED ORDER — POLYETHYLENE GLYCOL 3350 17 G PO PACK
17.0000 g | PACK | Freq: Every day | ORAL | Status: DC | PRN
  Administered 2020-04-15: 17 g via ORAL
  Filled 2020-04-12: qty 1

## 2020-04-12 MED ORDER — IPRATROPIUM-ALBUTEROL 0.5-2.5 (3) MG/3ML IN SOLN
3.0000 mL | Freq: Four times a day (QID) | RESPIRATORY_TRACT | Status: DC
  Administered 2020-04-12 – 2020-04-13 (×5): 3 mL via RESPIRATORY_TRACT
  Filled 2020-04-12 (×5): qty 3

## 2020-04-12 MED ORDER — INSULIN ASPART 100 UNIT/ML ~~LOC~~ SOLN
4.0000 [IU] | Freq: Three times a day (TID) | SUBCUTANEOUS | Status: DC
  Administered 2020-04-12: 4 [IU] via SUBCUTANEOUS

## 2020-04-12 MED ORDER — IPRATROPIUM-ALBUTEROL 0.5-2.5 (3) MG/3ML IN SOLN
3.0000 mL | Freq: Once | RESPIRATORY_TRACT | Status: AC
  Administered 2020-04-12: 3 mL via RESPIRATORY_TRACT
  Filled 2020-04-12: qty 3

## 2020-04-12 MED ORDER — UMECLIDINIUM BROMIDE 62.5 MCG/INH IN AEPB
1.0000 | INHALATION_SPRAY | Freq: Every day | RESPIRATORY_TRACT | Status: DC
  Administered 2020-04-13 – 2020-04-14 (×2): 1 via RESPIRATORY_TRACT
  Filled 2020-04-12: qty 7

## 2020-04-12 MED ORDER — ONDANSETRON HCL 4 MG PO TABS: 4.0000 mg | ORAL_TABLET | Freq: Four times a day (QID) | ORAL | Status: DC | PRN

## 2020-04-12 MED ORDER — ACETAMINOPHEN 650 MG RE SUPP: 650.0000 mg | Freq: Four times a day (QID) | RECTAL | Status: DC | PRN

## 2020-04-12 MED ORDER — GUAIFENESIN ER 600 MG PO TB12
1200.0000 mg | ORAL_TABLET | Freq: Two times a day (BID) | ORAL | Status: DC
  Administered 2020-04-12 – 2020-04-16 (×8): 1200 mg via ORAL
  Filled 2020-04-12 (×9): qty 2

## 2020-04-12 MED ORDER — LORAZEPAM 0.5 MG PO TABS
0.5000 mg | ORAL_TABLET | ORAL | Status: DC | PRN
  Administered 2020-04-12 – 2020-04-14 (×5): 1 mg via ORAL
  Filled 2020-04-12 (×6): qty 2

## 2020-04-12 NOTE — ED Notes (Signed)
Respiratory called to attempt bipap after ativan given. Pt states he feels ready to try again.

## 2020-04-12 NOTE — Progress Notes (Signed)
[facility]  pre-treatment 100L/min     Post-treatment    150 L/min

## 2020-04-12 NOTE — ED Triage Notes (Signed)
Pt c/o of increased sob x 5 days. Wear 3 L [place] chronically.   Increased [place] to 8 L at this time. 93%

## 2020-04-12 NOTE — Progress Notes (Signed)
Patient declined use of BiPAP at this time. SpO2 on 4l/min [place] 93%.

## 2020-04-12 NOTE — H&P (Signed)
History and Physical  [facility]  Miguel Marsh UXL:[id number] DOB: 1961/07/20 DOA: 04/12/2020  PCP: Scotty Court, DO  Patient coming from: Home   I have personally briefly reviewed patient's old medical records in [place]  Chief Complaint: SOB   HPI: Miguel Marsh is a 59 y.o. male longtime smoker with severe COPD was hospitalized in March 2021 and failed BiPAP therapy and was subsequently intubated and transferred to [facility], ICU.  He was eventually extubated and then suffered from hypotension requiring pressor support.  He recovered and discharged home.  He is oxygen dependent 3 L/min.  He reports that he continues smoking cigarettes.  He has a history of metastatic non-small cell lung cancer treated for 2 years with immunotherapy and now considered to be in remission.  He has been monitored and managed by [facility] for his cancer.  He lives home alone.  He presented to the emergency department today complaining of shortness of breath progressive over the last 5 days.  Increased activity and ambulation seem to worsen his shortness of breath.  He was hypoxic on arrival.  His oxygen was increased to 8 L nasal cannula with improvement to 93%.  This was subsequently weaned down to 3 L.  He has been talking in short sentences.  He is unable to lie recumbent.  He is not sleeping well due to shortness of breath.  His ABG revealed a PCO2 of 82.4.  He was started on BiPAP in the emergency department and admission was requested.  He was given bronchodilator treatment and started on IV steroids.  His SARS 2 coronavirus test was negative.  Review of Systems: As per HPI otherwise 10 point review of systems negative.   Past Medical History:  Diagnosis Date  . Arthritis   . Bronchitis   . Cancer [facility])    metastatic NSCLC (followed by [facility])  . COPD (chronic obstructive pulmonary disease) ([place])   . HTN (hypertension)     Past Surgical History:  Procedure Laterality  Date  . LUNG BIOPSY    . TOTAL HIP ARTHROPLASTY    . TUMOR REMOVAL Right 12/04/2017     reports that he has quit smoking. His smoking use included cigarettes. He smoked 0.10 packs per day. He has never used smokeless tobacco. He reports current alcohol use. He reports that he does not use drugs.  No Known Allergies  History reviewed. No pertinent family history.   Prior to Admission medications   Medication Sig Start Date End Date Taking? Authorizing Provider  albuterol (PROVENTIL HFA;VENTOLIN HFA) 108 (90 Base) MCG/ACT inhaler Inhale 2 puffs into the lungs every 6 (six) hours as needed for wheezing. 07/17/18   Roxan Hockey, MD  albuterol (PROVENTIL) (2.5 MG/3ML) 0.083% nebulizer solution Inhale 2.5 mg into the lungs every 6 (six) hours as needed for wheezing or shortness of breath.  01/19/20   [provider]  Fluticasone-Umeclidin-Vilant (TRELEGY ELLIPTA) 100-62.5-25 MCG/INH AEPB Inhale 1 puff into the lungs daily.    [provider]  guaiFENesin (MUCINEX) 600 MG 12 hr tablet Take 1 tablet (600 mg total) by mouth 2 (two) times daily. Patient taking differently: Take 600 mg by mouth 2 (two) times daily as needed for cough or to loosen phlegm.  07/17/18   Roxan Hockey, MD  hydrochlorothiazide (HYDRODIURIL) 12.5 MG tablet Take 1 tablet (12.5 mg total) by mouth daily. Patient taking differently: Take 12.5 mg by mouth daily as needed (for fluid).  07/17/18  Roxan Hockey, MD  ipratropium-albuterol (DUONEB) 0.5-2.5 (3) MG/3ML SOLN Take 3 mLs by nebulization every 4 (four) hours as needed. Patient taking differently: Take 3 mLs by nebulization every 4 (four) hours as needed (shortness of breath/wheezing).  12/16/19   [name], MD  naproxen sodium (ALEVE) 220 MG tablet Take 220 mg by mouth 2 (two) times daily as needed (pain/headache).    [provider]  Oxycodone HCl 10 MG TABS Take 10 mg by mouth every 6 (six) hours as needed (pain).  01/19/20   [provider]  OXYGEN Inhale 2 L into the lungs continuous.    [provider]    Physical Exam: Vitals:   04/12/20 1100 04/12/20 1130 04/12/20 1158 04/12/20 1200  BP: 131/82 (!) 122/91  (!) 154/98  Pulse: 89 89  84  Resp: 11 19  14   SpO2: 92% 92% 92% 94%  Weight:      Height:       Constitutional: Chronically ill-appearing male lying in bed, speaking in short sentences, increased work of breathing noted, NAD, calm. Eyes: PERRL, lids and conjunctivae normal ENMT: Mucous membranes are moist. Posterior pharynx clear of any exudate or lesions.Normal dentition.  Neck: normal, supple, no masses, no thyromegaly Respiratory: Poor air movement noted bilaterally, bibasilar expiratory wheezing, no crackles.  Moderate increased work of breathing.  Mild accessory muscle use.  Cardiovascular: Regular rate and rhythm, no murmurs / rubs / gallops. No extremity edema. 2+ pedal pulses. No carotid bruits.  Abdomen: no tenderness, no masses palpated. No hepatosplenomegaly. Bowel sounds positive.  Musculoskeletal: no clubbing / cyanosis. No joint deformity upper and lower extremities. Good ROM, no contractures. Normal muscle tone.  Skin: no rashes, lesions, ulcers. No induration Neurologic: CN 2-12 grossly intact. Sensation intact, DTR normal. Strength 5/5 in all 4.  Psychiatric:  Alert and oriented x 3. Normal mood.   Labs on Admission: I have personally reviewed following labs and imaging studies  CBC: Recent Labs  Lab 04/12/20 0932  WBC 9.5  NEUTROABS 6.5  HGB 15.9  HCT 53.7*  MCV 94.5  PLT 631   Basic Metabolic Panel: Recent Labs  Lab 04/12/20 0932  NA 138  K 4.4  CL 96*  CO2 35*  GLUCOSE 122*  BUN 11  CREATININE 0.94  CALCIUM 8.7*   GFR: Estimated Creatinine Clearance: 103.1 mL/min (by C-G formula based on SCr of 0.94 mg/dL). Liver Function Tests: No results for input(s): AST, ALT, ALKPHOS, BILITOT, PROT, ALBUMIN in the last 168 hours. No results for input(s):  LIPASE, AMYLASE in the last 168 hours. No results for input(s): AMMONIA in the last 168 hours. Coagulation Profile: No results for input(s): INR, PROTIME in the last 168 hours. Cardiac Enzymes: No results for input(s): CKTOTAL, CKMB, CKMBINDEX, TROPONINI in the last 168 hours. BNP (last 3 results) No results for input(s): PROBNP in the last 8760 hours. HbA1C: No results for input(s): HGBA1C in the last 72 hours. CBG: No results for input(s): GLUCAP in the last 168 hours. Lipid Profile: No results for input(s): CHOL, HDL, LDLCALC, TRIG, CHOLHDL, LDLDIRECT in the last 72 hours. Thyroid Function Tests: No results for input(s): TSH, T4TOTAL, FREET4, T3FREE, THYROIDAB in the last 72 hours. Anemia Panel: No results for input(s): VITAMINB12, FOLATE, FERRITIN, TIBC, IRON, RETICCTPCT in the last 72 hours. Urine analysis:    Component Value Date/Time   COLORURINE YELLOW 01/22/2020 1251   APPEARANCEUR CLEAR 01/22/2020 1251   LABSPEC 1.017 01/22/2020 1251   PHURINE 6.0 01/22/2020 1251  GLUCOSEU NEGATIVE 01/22/2020 [place] 01/22/2020 1251   [place] 01/22/2020 1251   KETONESUR NEGATIVE 01/22/2020 1251   PROTEINUR NEGATIVE 01/22/2020 1251   UROBILINOGEN 0.2 10/21/2013 0200   NITRITE NEGATIVE 01/22/2020 1251   LEUKOCYTESUR NEGATIVE 01/22/2020 1251    Radiological Exams on Admission: DG Chest Port 1 View  Result Date: 04/12/2020 CLINICAL DATA:  Shortness of breath EXAM: PORTABLE CHEST 1 VIEW COMPARISON:  January 24, 2020 FINDINGS: There is mild bibasilar atelectasis. Lungs otherwise are clear. Heart is mildly enlarged with pulmonary venous hypertension. No adenopathy. No bone lesions. IMPRESSION: Slight bibasilar atelectasis. No edema or airspace opacity. There is cardiomegaly with a degree of pulmonary vascular congestion. No evident adenopathy. Electronically Signed   By: Lowella Grip III M.D.   On: 04/12/2020 09:58   EKG: Independently reviewed.  NSR  Assessment/Plan Principal Problem:   Acute on chronic respiratory failure with hypoxia and hypercapnia (HCC) Active Problems:   COPD exacerbation (HCC)   Dyspnea   Essential hypertension   Non-small cell cancer of right lung (in remission)   GERD (gastroesophageal reflux disease)   Hyperglycemia  1. Acute on chronic respiratory failure with hypoxia and hypercapnia-patient has a severe COPD exacerbation.  He has been started on BiPAP therapy but he has a history of not tolerating it well.  He has required intubation in the past.  He is being admitted to the stepdown ICU.  Continue IV steroids and supportive care supplemental oxygen, IV antibiotics and respiratory therapy.  Mucinex ordered.  Bronchodilators around-the-clock.  Unfortunately he remains HIGH RISK for intubation and mechanical ventilation. 2. GERD-Protonix ordered for GI protection. 3. Tobacco abuse-I counseled him extensively at the bedside and ordered a nicotine patch. 4. Essential hypertension-blood pressures stable monitor closely.  Hydralazine ordered as needed for elevated blood pressure readings. 5. Hyperglycemia-he has a history of prediabetes mellitus and likely will have steroid-induced hyperglycemia.  Ordered supplemental sliding scale coverage and CBG monitoring. 6. Metastatic non-small cell lung cancer-he has been fully treated with immunotherapy for 2 years and now is considered to be in remission. 7. CODE STATUS-if he continues to smoke would likely benefit from a palliative medicine consult for goals of care.  DVT prophylaxis: Lovenox Code Status: Full Family Communication: No one present at bedside Disposition Plan: Home when medically stable Consults called: Respiratory therapy Admission status: INP  Critical Care Procedure Note Authorized and Performed by: Murvin Natal MD  Total Critical Care time:  70 mins Due to a high probability of clinically significant, life threatening deterioration, the patient  required my highest level of preparedness to intervene emergently and I personally spent this critical care time directly and personally managing the patient.  This critical care time included obtaining a history; examining the patient, pulse oximetry; ordering and review of studies; arranging urgent treatment with development of a management plan; evaluation of patient's response of treatment; frequent reassessment; and discussions with other providers.  This critical care time was performed to assess and manage the high probability of imminent and life threatening deterioration that could result in multi-organ failure.  It was exclusive of separately billable procedures and treating other patients and teaching time.    Irwin Brakeman MD [facility] How to contact the [facility] Attending or Consulting provider [place] or covering provider during after hours [place], for this patient?  1. Check the care team in [facility] and look for a) attending/consulting [facility] provider listed and b) the [facility] team listed 2. Log into  [email or web address] and use [place]'s universal password to access. If you do not have the password, please contact the hospital operator. 3. Locate the [facility] provider you are looking for under [facility] and page to a number that you can be directly reached. 4. If you still have difficulty reaching the provider, please page the [facility] (Director on Call) for the Hospitalists listed on amion for assistance.   If 7PM-7AM, please contact night-coverage [email or web address] Password [facility]  04/12/2020, 1:07 PM

## 2020-04-12 NOTE — Progress Notes (Signed)
RT attempted to place patient on BiPAP 16/8 with 45% and the patient stated " I can't take it, I don't think I can do this."  I adjusted the pressures down to 10/5 with 45% and he still could not tolerate the BiPAP. Dr. Wynetta Emery was notified of the situation and will prescribe medication to help relax patient so hopefully he can use the BiPAP.

## 2020-04-12 NOTE — ED Notes (Signed)
CRITICAL VALUE ALERT  Critical Value:  PCO2 82.4, pO2 <31  Date & Time Notied:  04/12/20 0957  Provider Notified: Evalee Jefferson, PA  Orders Received/Actions taken: EDP notified

## 2020-04-12 NOTE — ED Notes (Signed)
Pt placed back on 3 L [place] at 92 %

## 2020-04-12 NOTE — ED Provider Notes (Signed)
[facility] EMERGENCY DEPARTMENT Provider Note   CSN: [id number] Arrival date & time: 04/12/20  7124     History Chief Complaint  Patient presents with  . Shortness of Breath    Miguel Marsh is a 59 y.o. male with a history of COPD, metastatic non-small cell lung cancer followed at [facility], hypertension, GERD presenting with a 1 week history of gradually worsening shortness of breath.  He is chronically on home oxygen at 3 L which has not been adequate to control his shortness of breath.  He denies fevers or chills.  He has had a nonproductive cough.  He denies orthopnea or peripheral edema.  He also denies chest pain.  He has had a nonproductive cough.  He has used multiple doses of his home nebulizer treatments without improvement in symptoms.  He denies any Covid exposures but has not been vaccinated for this disease.  HPI     Past Medical History:  Diagnosis Date  . Arthritis   . Bronchitis   . Cancer [facility])    metastatic NSCLC (followed by [facility])  . COPD (chronic obstructive pulmonary disease) ([place])   . HTN (hypertension)     Patient Active Problem List   Diagnosis Date Noted  . Respiratory failure ([place]) 01/22/2020  . Acute respiratory failure ([place]) 01/23/2018  . Acute on chronic respiratory failure with hypoxia and hypercapnia ([place]) 01/23/2018  . Acute on chronic respiratory failure with hypoxia ([place]) 01/01/2018  . Obesity, Class III, BMI 40-49.9 (morbid obesity) ([place]) 01/01/2018  . COPD with exacerbation ([place]) 01/01/2018  . Essential hypertension 12/09/2015  . Non-small cell cancer of right lung ([place]) 12/09/2015  . GERD (gastroesophageal reflux disease) 12/09/2015  . COPD with acute exacerbation ([place]) 12/17/2014  . Tobacco use disorder 12/17/2014  . COPD exacerbation ([place]) 12/17/2014  . Obesity 12/17/2014  . Dyspnea   . Difficulty in walking(719.7) 04/02/2013  . Hip weakness 04/02/2013    Past Surgical History:  Procedure Laterality Date  . LUNG BIOPSY     . TOTAL HIP ARTHROPLASTY    . TUMOR REMOVAL Right 12/04/2017       No family history on file.  Social History   Tobacco Use  . Smoking status: Former Smoker    Packs/day: 0.10    Types: Cigarettes  . Smokeless tobacco: Never Used  Substance Use Topics  . Alcohol use: Yes    Comment: occ  . Drug use: No    Types: Marijuana, Cocaine    Comment: former    Home Medications Prior to Admission medications   Medication Sig Start Date End Date Taking? Authorizing Provider  albuterol (PROVENTIL HFA;VENTOLIN HFA) 108 (90 Base) MCG/ACT inhaler Inhale 2 puffs into the lungs every 6 (six) hours as needed for wheezing. 07/17/18   Roxan Hockey, MD  albuterol (PROVENTIL) (2.5 MG/3ML) 0.083% nebulizer solution Inhale 2.5 mg into the lungs every 6 (six) hours as needed for wheezing or shortness of breath.  01/19/20   [provider]  Fluticasone-Umeclidin-Vilant (TRELEGY ELLIPTA) 100-62.5-25 MCG/INH AEPB Inhale 1 puff into the lungs daily.    [provider]  guaiFENesin (MUCINEX) 600 MG 12 hr tablet Take 1 tablet (600 mg total) by mouth 2 (two) times daily. Patient taking differently: Take 600 mg by mouth 2 (two) times daily as needed for cough or to loosen phlegm.  07/17/18   Roxan Hockey, MD  hydrochlorothiazide (HYDRODIURIL) 12.5 MG tablet Take 1 tablet (12.5 mg total) by mouth daily. Patient taking differently: Take 12.5 mg  by mouth daily as needed (for fluid).  07/17/18   Emokpae, Courage, MD  ipratropium-albuterol (DUONEB) 0.5-2.5 (3) MG/3ML SOLN Take 3 mLs by nebulization every 4 (four) hours as needed. Patient taking differently: Take 3 mLs by nebulization every 4 (four) hours as needed (shortness of breath/wheezing).  12/16/19   [name], MD  naproxen sodium (ALEVE) 220 MG tablet Take 220 mg by mouth 2 (two) times daily as needed (pain/headache).    [provider]  Oxycodone HCl 10 MG TABS Take 10 mg by mouth every 6 (six) hours as needed (pain).   01/19/20   [provider]  OXYGEN Inhale 2 L into the lungs continuous.    [provider]    Allergies    Patient has no known allergies.  Review of Systems   Review of Systems  Constitutional: Negative for chills and fever.  HENT: Negative for congestion and sore throat.   Eyes: Negative.   Respiratory: Positive for shortness of breath. Negative for chest tightness.   Cardiovascular: Negative for chest pain.  Gastrointestinal: Negative for abdominal pain, nausea and vomiting.  Genitourinary: Negative.   Musculoskeletal: Negative for arthralgias, joint swelling and neck pain.  Skin: Negative.  Negative for rash and wound.  Neurological: Negative for dizziness, weakness, light-headedness, numbness and headaches.  Psychiatric/Behavioral: Negative.     Physical Exam Updated Vital Signs BP (!) 154/98   Pulse 84   Resp 14   Ht 5\' 9"  (1.753 m)   Wt 106.6 kg   SpO2 94%   BMI 34.70 kg/m   Physical Exam Vitals and nursing note reviewed.  Constitutional:      Appearance: He is well-developed.  HENT:     Head: Normocephalic and atraumatic.  Eyes:     Conjunctiva/sclera: Conjunctivae normal.  Cardiovascular:     Rate and Rhythm: Normal rate and regular rhythm.     Heart sounds: Normal heart sounds.  Pulmonary:     Effort: Accessory muscle usage and respiratory distress present. No tachypnea.     Breath sounds: No stridor. Decreased breath sounds present. No wheezing, rhonchi or rales.  Abdominal:     General: Bowel sounds are normal.     Palpations: Abdomen is soft.     Tenderness: There is no abdominal tenderness.  Musculoskeletal:        General: Normal range of motion.     Cervical back: Normal range of motion.  Skin:    General: Skin is warm and dry.  Neurological:     Mental Status: He is alert.     ED Results / Procedures / Treatments   Labs (all labs ordered are listed, but only abnormal results are displayed) Labs Reviewed  BASIC  METABOLIC PANEL - Abnormal; Notable for the following components:      Result Value   Chloride 96 (*)    CO2 35 (*)    Glucose, Bld 122 (*)    Calcium 8.7 (*)    All other components within normal limits  CBC WITH DIFFERENTIAL/PLATELET - Abnormal; Notable for the following components:   HCT 53.7 (*)    MCHC 29.6 (*)    Monocytes Absolute 1.1 (*)    All other components within normal limits  BLOOD GAS, VENOUS - Abnormal; Notable for the following components:   pCO2, Ven 82.4 (*)    pO2, Ven <31.0 (*)    Bicarbonate 31.1 (*)    Acid-Base Excess 12.6 (*)    All other components within normal limits  SARS CORONAVIRUS 2 BY RT PCR [facility] ORDER, [place] LAB)  BRAIN NATRIURETIC PEPTIDE    EKG EKG Interpretation  Date/Time:  Monday April 12 2020 09:05:23 EDT Ventricular Rate:  92 PR Interval:    QRS Duration: 78 QT Interval:  348 QTC Calculation: 431 R Axis:   39 Text Interpretation: Sinus rhythm No acute changes No significant change since last tracing Confirmed by Varney Biles ([id number]) on 04/12/2020 10:30:41 AM   Radiology DG Chest Port 1 View  Result Date: 04/12/2020 CLINICAL DATA:  Shortness of breath EXAM: PORTABLE CHEST 1 VIEW COMPARISON:  January 24, 2020 FINDINGS: There is mild bibasilar atelectasis. Lungs otherwise are clear. Heart is mildly enlarged with pulmonary venous hypertension. No adenopathy. No bone lesions. IMPRESSION: Slight bibasilar atelectasis. No edema or airspace opacity. There is cardiomegaly with a degree of pulmonary vascular congestion. No evident adenopathy. Electronically Signed   By: Lowella Grip III M.D.   On: 04/12/2020 09:58    Procedures Procedures (including critical care time)  Medications Ordered in ED Medications  albuterol (VENTOLIN HFA) 108 (90 Base) MCG/ACT inhaler 5 puff (5 puffs Inhalation Given 04/12/20 0946)  methylPREDNISolone sodium succinate (SOLU-MEDROL) 125 mg/2 mL injection 125 mg (125 mg  Intravenous Given 04/12/20 1000)  ipratropium-albuterol (DUONEB) 0.5-2.5 (3) MG/3ML nebulizer solution 3 mL (3 mLs Nebulization Given 04/12/20 1158)  albuterol (PROVENTIL) (2.5 MG/3ML) 0.083% nebulizer solution 2.5 mg (2.5 mg Nebulization Given 04/12/20 1158)    ED Course  I have reviewed the triage vital signs and the nursing notes.  Pertinent labs & imaging results that were available during my care of the patient were reviewed by me and considered in my medical decision making (see chart for details).  CRITICAL CARE Performed by: Evalee Jefferson Total critical care time: 40 minutes Critical care time was exclusive of separately billable procedures and treating other patients. Critical care was necessary to treat or prevent imminent or life-threatening deterioration. Critical care was time spent personally by me on the following activities: development of treatment plan with patient and/or surrogate as well as nursing, discussions with consultants, evaluation of patient's response to treatment, examination of patient, obtaining history from patient or surrogate, ordering and performing treatments and interventions, ordering and review of laboratory studies, ordering and review of radiographic studies, pulse oximetry and re-evaluation of patient's condition.    MDM Rules/Calculators/A&P                          Patient with history of COPD presenting with acute respiratory distress.  At initial exam he had very poor aeration without expiratory wheeze.  On home O2 at baseline 2 L, increased to 3 L here, O2 saturations above 93% as long as he is on 3 L but with increased persistent work of breathing and accessory muscle use.  He was given albuterol MDI 5 puffs with a spacer pending his Covid result.  This was negative.  He was then given albuterol and Atrovent neb treatment with no significant improvement in breathing symptoms.  Attempt to ambulate to the restroom made him very short of breath with  oxygen desaturation to mid 70s.  He has continued work of breathing and reports is feeling tired, he has no confusion.  Labs reviewed, he does have hypercapnia.  He has been on BiPAP in the past which he states has been helpful, this has been ordered.  Patient will require admission for further supportive care.  He was also  given a dose of Solu-Medrol IV.   Call from Dr Wynetta Emery of [facility] service, accepts pt for admission. Final Clinical Impression(s) / ED Diagnoses Final diagnoses:  COPD exacerbation ([place])  Acute respiratory failure with hypoxia and hypercapnia [facility])    Rx / DC Orders ED Discharge Orders    None       Landis Martins 04/12/20 [place], Ankit, MD 04/13/20 0745

## 2020-04-12 NOTE — ED Notes (Signed)
Respiratory at bedside, bipap reapplied

## 2020-04-12 NOTE — ED Notes (Signed)
bipap off per pt request, 4 L nasal cannula on

## 2020-04-13 LAB — GLUCOSE, CAPILLARY
Glucose-Capillary: 273 mg/dL — ABNORMAL HIGH (ref 70–99)
Glucose-Capillary: 181 mg/dL — ABNORMAL HIGH (ref 70–99)
Glucose-Capillary: 189 mg/dL — ABNORMAL HIGH (ref 70–99)
Glucose-Capillary: 149 mg/dL — ABNORMAL HIGH (ref 70–99)
Glucose-Capillary: 180 mg/dL — ABNORMAL HIGH (ref 70–99)

## 2020-04-13 LAB — COMPREHENSIVE METABOLIC PANEL
ALT: 11 U/L (ref 0–44)
AST: 12 U/L — ABNORMAL LOW (ref 15–41)
Albumin: 3.4 g/dL — ABNORMAL LOW (ref 3.5–5.0)
Alkaline Phosphatase: 78 U/L (ref 38–126)
Anion gap: 7 (ref 5–15)
BUN: 10 mg/dL (ref 6–20)
CO2: 32 mmol/L (ref 22–32)
Calcium: 9.1 mg/dL (ref 8.9–10.3)
Chloride: 98 mmol/L (ref 98–111)
Creatinine, Ser: 0.89 mg/dL (ref 0.61–1.24)
GFR calc Af Amer: >60 mL/min (ref >60)
GFR calc non Af Amer: >60 mL/min (ref >60)
Glucose, Bld: 251 mg/dL — ABNORMAL HIGH (ref 70–99)
Potassium: 4.6 mmol/L (ref 3.5–5.1)
Sodium: 137 mmol/L (ref 135–145)
Total Bilirubin: 0.3 mg/dL (ref 0.3–1.2)
Total Protein: 7 g/dL (ref 6.5–8.1)

## 2020-04-13 LAB — MAGNESIUM: Magnesium: 2 mg/dL (ref 1.7–2.4)

## 2020-04-13 MED ORDER — INSULIN ASPART 100 UNIT/ML ~~LOC~~ SOLN
5.0000 [IU] | Freq: Three times a day (TID) | SUBCUTANEOUS | Status: DC
  Administered 2020-04-13 – 2020-04-16 (×9): 5 [IU] via SUBCUTANEOUS

## 2020-04-13 NOTE — Progress Notes (Signed)
PROGRESS NOTE   Miguel Marsh  IRS:[id number] DOB: 17-Sep-1961 DOA: 04/12/2020 PCP: Scotty Court, DO   Chief Complaint  Patient presents with  . Shortness of Breath    Brief Admission History:  59 y.o. male longtime smoker with severe COPD was hospitalized in March 2021 and failed BiPAP therapy and was subsequently intubated and transferred to [facility], ICU.  He was eventually extubated and then suffered from hypotension requiring pressor support.  He recovered and discharged home.  He is oxygen dependent 3 L/min.  He reports that he continues smoking cigarettes.  He has a history of metastatic non-small cell lung cancer treated for 2 years with immunotherapy and now considered to be in remission.  He has been monitored and managed by [facility] for his cancer.  He lives home alone.  He presented to the emergency department today complaining of shortness of breath progressive over the last 5 days.  Increased activity and ambulation seem to worsen his shortness of breath.  He was hypoxic on arrival.  His oxygen was increased to 8 L nasal cannula with improvement to 93%.  This was subsequently weaned down to 3 L.  He has been talking in short sentences.  He is unable to lie recumbent.  He is not sleeping well due to shortness of breath.  His ABG revealed a PCO2 of 82.4.  He was started on BiPAP in the emergency department and admission was requested.  He was given bronchodilator treatment and started on IV steroids.  His SARS 2 coronavirus test was negative.  Assessment & Plan:   Principal Problem:   Acute on chronic respiratory failure with hypoxia and hypercapnia (HCC) Active Problems:   COPD exacerbation (HCC)   Dyspnea   Essential hypertension   Non-small cell cancer of right lung (in remission)   GERD (gastroesophageal reflux disease)   Hyperglycemia   1. Acute on chronic respiratory failure with hypoxia and hypercapnia-patient has a severe COPD exacerbation.   Unfortunately he is having a difficult time tolerating BiPAP therapy and remains HIGH RISK for intubation and mechanical ventilation.  He required intubation during recent hospitalization March 2021.  He was admitted to the stepdown ICU.  Continue IV steroids and supportive care supplemental oxygen, IV antibiotics and respiratory therapy.  Mucinex ordered.  Bronchodilators around-the-clock.   2. GERD-Protonix ordered for GI protection. 3. Tobacco abuse-I counseled him extensively at the bedside to stop all tobacco use and ordered a nicotine patch. 4. Essential hypertension-blood pressures stable monitor closely.  Hydralazine ordered as needed for elevated blood pressure readings. 5. Type 2 DM - as evidenced by a1c of 6.6%.  He now has steroid induced Hyperglycemia-continue prandial coverage and supplemental sliding scale coverage and frequent CBG monitoring. 6. Metastatic non-small cell lung cancer-he has been fully treated with immunotherapy for 2 years and now is considered to be in remission.  He is followed at [facility] for ongoing surveillance.  7. CODE STATUS-if he continues to smoke would likely benefit from a palliative medicine consult for goals of care.  DVT prophylaxis: Lovenox Code Status: Full Family Communication: pt declined when asked if he wanted MD to call family Disposition Plan: Home when medically stable Consults called: Respiratory therapy Admission status: INP  Status is: Inpatient  Remains inpatient appropriate because:IV treatments appropriate due to intensity of illness or inability to take PO and Inpatient level of care appropriate due to severity of illness   Dispo: The patient is from: Home  Anticipated d/c is to: Home              Anticipated d/c date is: > 3 days              Patient currently is not medically stable to d/c.   Consultants:     Procedures:     Antimicrobials:  Doxycycline 6/14>>  Subjective: Pt says he becomes  severely SOB with ambulation, SOB lying recumbent, difficult time tolerating bipap but did have for several hours overnight.    Objective: Vitals:   04/13/20 0500 04/13/20 0600 04/13/20 0809 04/13/20 0851  BP: (!) 149/77 (!) 152/79    Pulse:  79    Resp: (!) 24 (!) 24    Temp:   98 F (36.7 C)   TempSrc:   Oral   SpO2:  (!) 53%  93%  Weight: 102 kg     Height:        Intake/Output Summary (Last 24 hours) at 04/13/2020 0906 Last data filed at 04/13/2020 0813 Gross per 24 hour  Intake 492.6 ml  Output 1100 ml  Net -607.4 ml   Filed Weights   04/12/20 0904 04/12/20 1734 04/13/20 0500  Weight: 106.6 kg 102.2 kg 102 kg    Examination:  General exam: chronically ill appearing male, appears older than stated age, sitting up on side of bed, hunched over. Mildly distressed. Speaking short sentences.  Respiratory system: poor air movement bilateral, anteriorly heard fine exp wheezes. Moderate increased WOB.  Cardiovascular system: S1 & S2 heard, RRR. trace pedal edema. Gastrointestinal system: Abdomen is nondistended, soft and nontender. No organomegaly or masses felt. Normal bowel sounds heard. Central nervous system: Alert and oriented. No focal neurological deficits. Extremities: Symmetric 5 x 5 power. Skin: No rashes, lesions or ulcers Psychiatry: Judgement and insight appear normal. Mood & affect appropriate.   Data Reviewed: I have personally reviewed following labs and imaging studies  CBC: Recent Labs  Lab 04/12/20 0932  WBC 9.5  NEUTROABS 6.5  HGB 15.9  HCT 53.7*  MCV 94.5  PLT 814    Basic Metabolic Panel: Recent Labs  Lab 04/12/20 0932 04/13/20 0414  NA 138 137  K 4.4 4.6  CL 96* 98  CO2 35* 32  GLUCOSE 122* 251*  BUN 11 10  CREATININE 0.94 0.89  CALCIUM 8.7* 9.1  MG  --  2.0    GFR: Estimated Creatinine Clearance: 101.2 mL/min (by C-G formula based on SCr of 0.89 mg/dL).  Liver Function Tests: Recent Labs  Lab 04/13/20 0414  AST 12*  ALT 11   ALKPHOS 78  BILITOT 0.3  PROT 7.0  ALBUMIN 3.4*    CBG: Recent Labs  Lab 04/12/20 1757 04/12/20 2115 04/13/20 0302 04/13/20 0807  GLUCAP 243* 138* 273* 181*    Recent Results (from the past 240 hour(s))  SARS Coronavirus 2 by RT PCR (hospital order, performed in [facility] hospital lab) Nasopharyngeal Nasopharyngeal Swab     Status: None   Collection Time: 04/12/20 10:07 AM   Specimen: Nasopharyngeal Swab  Result Value Ref Range Status   SARS Coronavirus 2 NEGATIVE NEGATIVE Final    Comment: (NOTE) SARS-CoV-2 target nucleic acids are NOT DETECTED.  The SARS-CoV-2 RNA is generally detectable in upper and lower respiratory specimens during the acute phase of infection. The lowest concentration of SARS-CoV-2 viral copies this assay can detect is 250 copies / mL. A negative result does not preclude SARS-CoV-2 infection and should not be used as the sole basis  for treatment or other patient management decisions.  A negative result may occur with improper specimen collection / handling, submission of specimen other than nasopharyngeal swab, presence of viral mutation(s) within the areas targeted by this assay, and inadequate number of viral copies (<250 copies / mL). A negative result must be combined with clinical observations, patient history, and epidemiological information.  Fact Sheet for Patients:   StrictlyIdeas.no  Fact Sheet for Healthcare Providers: BankingDealers.co.za  This test is not yet approved or  cleared by the [place] FDA and has been authorized for detection and/or diagnosis of SARS-CoV-2 by FDA under an Emergency Use Authorization (EUA).  This EUA will remain in effect (meaning this test can be used) for the duration of the COVID-19 declaration under Section 564(b)(1) of the Act, 21 U.S.C. section 360bbb-3(b)(1), unless the authorization is terminated or revoked sooner.  Performed at [facility], [address] [place], [place] [address]   MRSA PCR Screening     Status: None   Collection Time: 04/12/20  7:30 PM   Specimen: Nasal Mucosa; Nasopharyngeal  Result Value Ref Range Status   MRSA by PCR NEGATIVE NEGATIVE Final    Comment:        The GeneXpert MRSA Assay (FDA approved for NASAL specimens only), is one component of a comprehensive MRSA colonization surveillance program. It is not intended to diagnose MRSA infection nor to guide or monitor treatment for MRSA infections. Performed at [facility], [address] [place], [place] [address]      Radiology Studies: [facility] Chest [facility] 1 View  Result Date: 04/12/2020 CLINICAL DATA:  Shortness of breath EXAM: PORTABLE CHEST 1 VIEW COMPARISON:  January 24, 2020 FINDINGS: There is mild bibasilar atelectasis. Lungs otherwise are clear. Heart is mildly enlarged with pulmonary venous hypertension. No adenopathy. No bone lesions. IMPRESSION: Slight bibasilar atelectasis. No edema or airspace opacity. There is cardiomegaly with a degree of pulmonary vascular congestion. No evident adenopathy. Electronically Signed   By: Lowella Grip III M.D.   On: 04/12/2020 09:58     Scheduled Meds: . Chlorhexidine Gluconate Cloth  6 each Topical Daily  . enoxaparin (LOVENOX) injection  40 mg Subcutaneous Q24H  . fluticasone furoate-vilanterol  1 puff Inhalation Daily   Or  . umeclidinium bromide  1 puff Inhalation Daily  . guaiFENesin  1,200 mg Oral BID  . insulin aspart  0-15 Units Subcutaneous TID WC  . insulin aspart  0-5 Units Subcutaneous QHS  . insulin aspart  5 Units Subcutaneous TID WC  . ipratropium-albuterol  3 mL Nebulization Q6H  . mouth rinse  15 mL Mouth Rinse BID  . methylPREDNISolone (SOLU-MEDROL) injection  60 mg Intravenous Q6H  . nicotine  21 mg Transdermal Daily   Continuous Infusions: . doxycycline (VIBRAMYCIN) IV 125 mL/hr at 04/13/20 0635     LOS: 1 day   Critical Care Procedure Note Authorized and Performed  by: Murvin Natal MD  Total Critical Care time:  32 mins  Due to a high probability of clinically significant, life threatening deterioration, the patient required my highest level of preparedness to intervene emergently and I personally spent this critical care time directly and personally managing the patient.  This critical care time included obtaining a history; examining the patient, pulse oximetry; ordering and review of studies; arranging urgent treatment with development of a management plan; evaluation of patient's response of treatment; frequent reassessment; and discussions with other providers.  This critical care time was performed to assess and manage  the high probability of imminent and life threatening deterioration that could result in multi-organ failure.  It was exclusive of separately billable procedures and treating other patients and teaching time.    Irwin Brakeman, MD How to contact the [facility] Attending or Consulting provider [place] or covering provider during after hours [place], for this patient?  1. Check the care team in [facility] and look for a) attending/consulting TR[facility] provider listed and b) the [facility] team listed 2. Log into [email or web address] and use [place]'s universal password to access. If you do not have the password, please contact the hospital operator. 3. Locate the [facility] provider you are looking for under [facility] and page to a number that you can be directly reached. 4. If you still have difficulty reaching the provider, please page the [facility] (Director on Call) for the Hospitalists listed on amion for assistance.  04/13/2020, 9:06 AM

## 2020-04-13 NOTE — Progress Notes (Signed)
Patient is resting comfortably on 3LNC and vitals are stable. BIPAP is in room on standby but not needed at this time. Will continue to monitor as needed.

## 2020-04-14 ENCOUNTER — Encounter (HOSPITAL_COMMUNITY): Payer: Self-pay | Admitting: Family Medicine

## 2020-04-14 DIAGNOSIS — Z716 Tobacco abuse counseling: Secondary | ICD-10-CM

## 2020-04-14 DIAGNOSIS — Z515 Encounter for palliative care: Secondary | ICD-10-CM

## 2020-04-14 DIAGNOSIS — Z7189 Other specified counseling: Secondary | ICD-10-CM

## 2020-04-14 DIAGNOSIS — J9621 Acute and chronic respiratory failure with hypoxia: Secondary | ICD-10-CM

## 2020-04-14 DIAGNOSIS — C3491 Malignant neoplasm of unspecified part of right bronchus or lung: Secondary | ICD-10-CM

## 2020-04-14 DIAGNOSIS — J9622 Acute and chronic respiratory failure with hypercapnia: Secondary | ICD-10-CM

## 2020-04-14 LAB — COMPREHENSIVE METABOLIC PANEL WITH GFR
ALT: 10 U/L (ref 0–44)
AST: 10 U/L — ABNORMAL LOW (ref 15–41)
Albumin: 3.3 g/dL — ABNORMAL LOW (ref 3.5–5.0)
Alkaline Phosphatase: 70 U/L (ref 38–126)
Anion gap: 9 (ref 5–15)
BUN: 13 mg/dL (ref 6–20)
CO2: 33 mmol/L — ABNORMAL HIGH (ref 22–32)
Calcium: 9.1 mg/dL (ref 8.9–10.3)
Chloride: 97 mmol/L — ABNORMAL LOW (ref 98–111)
Creatinine, Ser: 0.74 mg/dL (ref 0.61–1.24)
GFR calc Af Amer: >60 mL/min (ref >60)
GFR calc non Af Amer: >60 mL/min (ref >60)
Glucose, Bld: 178 mg/dL — ABNORMAL HIGH (ref 70–99)
Potassium: 4.9 mmol/L (ref 3.5–5.1)
Sodium: 139 mmol/L (ref 135–145)
Total Bilirubin: 0.4 mg/dL (ref 0.3–1.2)
Total Protein: 6.6 g/dL (ref 6.5–8.1)

## 2020-04-14 LAB — GLUCOSE, CAPILLARY
Glucose-Capillary: 167 mg/dL — ABNORMAL HIGH (ref 70–99)
Glucose-Capillary: 173 mg/dL — ABNORMAL HIGH (ref 70–99)
Glucose-Capillary: 182 mg/dL — ABNORMAL HIGH (ref 70–99)
Glucose-Capillary: 127 mg/dL — ABNORMAL HIGH (ref 70–99)
Glucose-Capillary: 229 mg/dL — ABNORMAL HIGH (ref 70–99)

## 2020-04-14 LAB — MAGNESIUM: Magnesium: 2.1 mg/dL (ref 1.7–2.4)

## 2020-04-14 MED ORDER — NICOTINE 14 MG/24HR TD PT24
14.0000 mg | MEDICATED_PATCH | Freq: Every day | TRANSDERMAL | Status: DC
  Administered 2020-04-14: 14 mg via TRANSDERMAL
  Filled 2020-04-14 (×3): qty 1

## 2020-04-14 MED ORDER — BUDESONIDE 0.5 MG/2ML IN SUSP
0.5000 mg | Freq: Two times a day (BID) | RESPIRATORY_TRACT | Status: DC
  Administered 2020-04-14 – 2020-04-16 (×4): 0.5 mg via RESPIRATORY_TRACT
  Filled 2020-04-14 (×4): qty 2

## 2020-04-14 MED ORDER — IPRATROPIUM-ALBUTEROL 0.5-2.5 (3) MG/3ML IN SOLN: 3.0000 mL | Freq: Four times a day (QID) | RESPIRATORY_TRACT | Status: DC | PRN

## 2020-04-14 MED ORDER — ARFORMOTEROL TARTRATE 15 MCG/2ML IN NEBU
15.0000 ug | INHALATION_SOLUTION | Freq: Two times a day (BID) | RESPIRATORY_TRACT | Status: DC
  Administered 2020-04-14 – 2020-04-16 (×4): 15 ug via RESPIRATORY_TRACT
  Filled 2020-04-14 (×4): qty 2

## 2020-04-14 MED ORDER — IPRATROPIUM-ALBUTEROL 0.5-2.5 (3) MG/3ML IN SOLN
3.0000 mL | Freq: Three times a day (TID) | RESPIRATORY_TRACT | Status: DC
  Administered 2020-04-14: 3 mL via RESPIRATORY_TRACT
  Filled 2020-04-14: qty 3

## 2020-04-14 MED ORDER — METHYLPREDNISOLONE SODIUM SUCC 125 MG IJ SOLR
60.0000 mg | Freq: Three times a day (TID) | INTRAMUSCULAR | Status: DC
  Administered 2020-04-14 – 2020-04-16 (×6): 60 mg via INTRAVENOUS
  Filled 2020-04-14 (×5): qty 2

## 2020-04-14 MED ORDER — IPRATROPIUM-ALBUTEROL 0.5-2.5 (3) MG/3ML IN SOLN
3.0000 mL | Freq: Four times a day (QID) | RESPIRATORY_TRACT | Status: DC
  Administered 2020-04-14 – 2020-04-16 (×6): 3 mL via RESPIRATORY_TRACT
  Filled 2020-04-14 (×6): qty 3

## 2020-04-14 NOTE — Progress Notes (Signed)
Patient is refusing the use of BIPAP at this time. All vitals are stable and patient is on 3LNC. BIPAP at bedside if needed. RN aware.

## 2020-04-14 NOTE — Consult Note (Signed)
Consultation Note Date: 04/14/2020   Patient Name: Miguel Marsh  DOB: [birth year]  MRN: [id number]  Age / Sex: 59 y.o., male  PCP: Miguel Court, DO Referring Physician: [name], MD  Reason for Consultation: Establishing goals of care and Psychosocial/spiritual support  HPI/Patient Profile: 59 y.o. male  with past medical history of metastatic non-small cell lung cancer followed by [facility] in remission, COPD continues to smoke 2 cigarettes/day with a almost 80-year pack history, HTN, arthritis, total hip arthroplasty admitted on 04/12/2020 with acute on chronic respiratory failure with hypoxia and hypercapnia/severe COPD exacerbation.   Clinical Assessment and Goals of Care:  I have reviewed medical records including EPIC notes, labs and imaging, received report from attending, examined the patient and met at bedside to discuss diagnosis prognosis, GOC, EOL wishes, disposition and options.  Miguel Marsh greets me making and mostly keeping eye contact.  He is calm and cooperative, oriented, able to make his basic needs known.  I introduced Palliative Medicine as specialized medical care for people living with serious illness. It focuses on providing relief from the symptoms and stress of a serious illness.   We discussed a brief life review of the patient.  Miguel Marsh, Miguel Marsh has been disabled since 2014 after he had a hip replacement.  He lives independently in his own home, but has a CAP aid 7 days/week for around 3 hours/day.  We discussed current illness and what it means in the larger context of on-going co-morbidities.  Natural disease trajectory and expectations at EOL were discussed.  Miguel Marsh tells me that he follows up with [facility] oncology every 3 months.  I attempted to elicit values and goals of care important to the patient.  We talked about physical therapy consult, inpatient  rehab versus at home rehab.  Miguel Marsh tells me that he was not want back to SNF rehab, but would accept home health physical therapy if he is qualified.  Advanced directives, concepts specific to code status, were considered and discussed.  He endorses "treat the treatable but allowing natural death".  Orders adjusted for DNR  Hospice and Palliative Care services outpatient were explained and offered.  We talked about outpatient palliative services for symptom management and ongoing goals of care.  At this point Miguel Marsh tells me that he will talk with his primary care doctor before deciding if he will accept outpatient palliative  Questions and concerns were addressed.  The patient was encouraged to call with questions or concerns.   Conference with attending, bedside nursing staff and transition of care team related to patient condition, needs, goals of care, CODE STATUS.   HCPOA     NEXT OF KIN -Miguel Marsh tells me that he would want his brother, Miguel Marsh to be his Ambulance person.  Miguel Marsh tells me that he is not married, his parents are deceased.  He has 1 son, Miguel Marsh.  Request help for power of attorney paperwork.    SUMMARY OF RECOMMENDATIONS   Continue to treat the treatable  but no CPR or intubation.  Code Status/Advance Care Planning:  DNR -we talked about "treat the treatable but allowing natural death".  Miguel Marsh tells me that he has been intubated a few times and "it was rough".  He tells me that he would not want to go through that again.  I asked if his brother Miguel Marsh knows his wishes.  He tells me that he does not but Miguel Marsh will reach out to him.  Symptom Management:   Per hospitalist, no additional needs at this time.  Palliative Prophylaxis:   Frequent Pain Assessment and Oral Care  Additional Recommendations (Limitations, Scope, Preferences):  Full Scope Treatment  Psycho-social/Spiritual:   Desire for further Chaplaincy support:no  Additional  Recommendations: Caregiving  Support/Resources and Education on Hospice  Prognosis:   Unable to determine, 6 months or less would not be surprising based on chronic disease burden, respiratory status and continues to smoke, 3 hospital visits in the last 6 months.  Discharge Planning: To be determined, based on outcomes and patient choice.      Primary Diagnoses: Present on Admission: . Acute on chronic respiratory failure with hypoxia and hypercapnia (HCC) . COPD exacerbation ([place]) . Dyspnea . Essential hypertension . GERD (gastroesophageal reflux disease) . Non-small cell cancer of right lung (in remission) . Hyperglycemia   I have reviewed the medical record, interviewed the patient and family, and examined the patient. The following aspects are pertinent.  Past Medical History:  Diagnosis Date  . Arthritis   . Bronchitis   . Cancer [facility])    metastatic NSCLC (followed by [facility])  . COPD (chronic obstructive pulmonary disease) ([place])   . HTN (hypertension)    Social History   Socioeconomic History  . Marital status: Single    Spouse name: Not on file  . Number of children: Not on file  . Years of education: Not on file  . Highest education level: Not on file  Occupational History  . Not on file  Tobacco Use  . Smoking status: Former Smoker    Packs/day: 0.10    Types: Cigarettes  . Smokeless tobacco: Never Used  Substance and Sexual Activity  . Alcohol use: Yes    Comment: occ  . Drug use: No    Types: Marijuana, Cocaine    Comment: former  . Sexual activity: Not on file  Other Topics Concern  . Not on file  Social History Narrative  . Not on file   Social Determinants of Health   Financial Resource Strain:   . Difficulty of Paying Living Expenses:   Food Insecurity:   . Worried About Charity fundraiser in the Last Year:   . Arboriculturist in the Last Year:   Transportation Needs:   . Film/video editor (Medical):   Marland Kitchen Lack of Transportation  (Non-Medical):   Physical Activity:   . Days of Exercise per Week:   . Minutes of Exercise per Session:   Stress:   . Feeling of Stress :   Social Connections:   . Frequency of Communication with Friends and Family:   . Frequency of Social Gatherings with Friends and Family:   . Attends Religious Services:   . Active Member of Clubs or Organizations:   . Attends Archivist Meetings:   Marland Kitchen Marital Status:    History reviewed. No pertinent family history. Scheduled Meds: . arformoterol  15 mcg Nebulization BID  . budesonide (PULMICORT) nebulizer solution  0.5 mg Nebulization BID  .  Chlorhexidine Gluconate Cloth  6 each Topical Daily  . enoxaparin (LOVENOX) injection  40 mg Subcutaneous Q24H  . guaiFENesin  1,200 mg Oral BID  . insulin aspart  0-15 Units Subcutaneous TID WC  . insulin aspart  0-5 Units Subcutaneous QHS  . insulin aspart  5 Units Subcutaneous TID WC  . ipratropium-albuterol  3 mL Nebulization Q6H  . mouth rinse  15 mL Mouth Rinse BID  . methylPREDNISolone (SOLU-MEDROL) injection  60 mg Intravenous Q8H  . nicotine  14 mg Transdermal Daily   Continuous Infusions: PRN Meds:.acetaminophen **OR** acetaminophen, hydrALAZINE, ipratropium-albuterol, LORazepam, ondansetron **OR** ondansetron (ZOFRAN) IV, oxyCODONE, polyethylene glycol, zolpidem Medications Prior to Admission:  Prior to Admission medications   Medication Sig Start Date End Date Taking? Authorizing Provider  albuterol (PROVENTIL HFA;VENTOLIN HFA) 108 (90 Base) MCG/ACT inhaler Inhale 2 puffs into the lungs every 6 (six) hours as needed for wheezing. 07/17/18  Yes Roxan Hockey, MD  albuterol (PROVENTIL) (2.5 MG/3ML) 0.083% nebulizer solution Inhale 2.5 mg into the lungs every 6 (six) hours as needed for wheezing or shortness of breath.  01/19/20  Yes [provider]  Fluticasone-Umeclidin-Vilant (TRELEGY ELLIPTA) 100-62.5-25 MCG/INH AEPB Inhale 1 puff into the lungs daily.   Yes [provider]  guaiFENesin (MUCINEX) 600 MG 12 hr tablet Take 1 tablet (600 mg total) by mouth 2 (two) times daily. Patient taking differently: Take 600 mg by mouth 2 (two) times daily as needed for cough or to loosen phlegm.  07/17/18  Yes Emokpae, Courage, MD  hydrochlorothiazide (HYDRODIURIL) 12.5 MG tablet Take 1 tablet (12.5 mg total) by mouth daily. Patient taking differently: Take 12.5 mg by mouth daily as needed (for fluid).  07/17/18  Yes Emokpae, Courage, MD  ipratropium-albuterol (DUONEB) 0.5-2.5 (3) MG/3ML SOLN Take 3 mLs by nebulization every 4 (four) hours as needed. Patient taking differently: Take 3 mLs by nebulization every 4 (four) hours as needed (shortness of breath/wheezing).  12/16/19  Yes Tat, Shanon Brow, MD  Oxycodone HCl 10 MG TABS Take 10 mg by mouth every 6 (six) hours as needed (pain).  01/19/20  Yes [provider]  OXYGEN Inhale 3 L into the lungs continuous.    Yes [provider]   No Known Allergies Review of Systems  Unable to perform ROS: Acuity of condition    Physical Exam Vitals and nursing note reviewed.  HENT:     Head: Normocephalic and atraumatic.  Cardiovascular:     Rate and Rhythm: Normal rate.  Pulmonary:     Effort: Pulmonary effort is normal. No tachypnea.  Abdominal:     Palpations: Abdomen is soft.  Musculoskeletal:     Right lower leg: No edema.     Left lower leg: No edema.  Skin:    General: Skin is warm and dry.  Neurological:     Mental Status: He is oriented to person, place, and time.  Psychiatric:        Mood and Affect: Mood is not anxious.        Behavior: Behavior is not agitated.     Vital Signs: BP (!) 145/77   Pulse 95   Temp 97.6 F (36.4 C) (Oral)   Resp 20   Ht '5\' 6"'$  (1.676 m)   Wt 102 kg   SpO2 95%   BMI 36.29 kg/m  Pain Scale: 0-10   Pain Score: 4    SpO2: SpO2: 95 % O2 Device:SpO2: 95 % O2 Flow Rate: .O2 Flow Rate (L/min): 3 L/min  IO: Intake/output summary:   Intake/Output  Summary (Last 24 hours) at 04/14/2020 1410 Last data filed at 04/14/2020 0916 Gross per 24 hour  Intake --  Output 750 ml  Net -750 ml    LBM: Last BM Date: 04/12/20 Baseline Weight: Weight: 106.6 kg Most recent weight: Weight: 102 kg     Palliative Assessment/Data:   Flowsheet Rows     Most Recent Value  Intake Tab  Referral Department Hospitalist  Unit at Time of Referral Cardiac/Telemetry Unit  Palliative Care Primary Diagnosis Pulmonary  Date Notified 04/14/20  Palliative Care Type New Palliative care  Reason for referral Clarify Goals of Care  Date of Admission 04/12/20  Date first seen by Palliative Care 04/14/20  # of days Palliative referral response time 0 Day(s)  # of days IP prior to Palliative referral 2  Clinical Assessment  Palliative Performance Scale Score 40%  Pain Max last 24 hours Not able to report  Pain Min Last 24 hours Not able to report  Dyspnea Max Last 24 Hours Not able to report  Dyspnea Min Last 24 hours Not able to report  Psychosocial & Spiritual Assessment  Palliative Care Outcomes      Time In: 1340 Time Out: 1430  Time Total: 50 minutes  Greater than 50%  of this time was spent counseling and coordinating care related to the above assessment and plan.  Signed by: Drue Novel, NP   Please contact Palliative Medicine Team phone at [phone] for questions and concerns.  For individual provider: See Shea Evans

## 2020-04-14 NOTE — Progress Notes (Signed)
Stated he wanted to wait and talk to his brother tomorrow about becoming DNR.  Secure chatted this to [facility] and Dr. Carles Collet

## 2020-04-14 NOTE — Progress Notes (Signed)
PROGRESS NOTE  Miguel Marsh UDJ:[id number] DOB: 1961-04-29 DOA: 04/12/2020 PCP: Scotty Court, DO  Brief History:  59 y.o.malewith medical history ofmetastatic non-small cell lung cancer in remission, hypertension, chronic respiratory failure on 3L, GERD, COPD, tobacco abuse presented with sob x 5 days.  He continues to smoke 2 cigarettes per day. He has a nearly 80 pack year history.He denied any fevers, chills, nausea, vomiting, diarrhea, abdominal pain, dysuria, hematuria. He complains of his usual right lower chest pain.  Assessment/Plan: Acute on chronic respiratory failure with hypoxiaand hypercarbia -Initially on 8 Lnasal cannula>>3.5L -Normallyon 3 L,24/7 -Wean oxygen for saturation88-92%  COPD exacerbation -StartPulmicort -continue IV solumedrol -Continue duo nebsduring hospitalization -startbrovana  Malignancy related pain -PMP Aware queried--receives monthly percocet 10/325 #120 from K. Georgina Quint, DNP -EKG--neg for ischemic changes -pt has had chronic Right lower chest, RUQ pain since his surgery 12/04/17 -10/20/19 CT chest--Scattered small pulmonary cysts. Diffuse bronchial wall thickening suggests airway predominant COPD. Fusiform bronchiectasis within both lower lobes, with areas of mucus plugging again noted in the periphery of the right lower lobe. 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, mediastinal mass excision and lymph node dissection with nerve sheath tumor resection 12/04/2017 at [facility] -follow up cardiothoracic at [facility]  Essential hypertension -BP remains controlled -continue HCTZ  Medication inducedHyperglycemia/Impaired glucose tolerance -2/12/21hemoglobin A1c--6.4 -04/12/20 A1C--6.6 -start novolog  ISS -elevated CBG partly due to steroids -carb modified diet  Class 2 obesity -HYI50.27 -Lifestyle modification  Tobacco abuse -cessation discussed     Status is: Inpatient  Remains inpatient appropriate because:IV treatments appropriate due to intensity of illness or inability to take PO   Dispo: The patient is from: Home              Anticipated d/c is to: Home              Anticipated d/c date is: 2 days              Patient currently is not medically stable to d/c.        Family Communication:   Family at bedside  Consultants:  none  Code Status:  FULL  DVT Prophylaxis: [place] Lovenox   Procedures: As Listed in Progress Note Above  Antibiotics: None       Subjective: Patient continues to have shortness of breath.  He has had some dyspnea with mild exertion.  He continues to complain of nonproductive cough.  Denies any hemoptysis.  There is no nausea, vomiting, diarrhea, abdominal pain.  There is no chest pain.  Objective: Vitals:   04/14/20 0500 04/14/20 0800 04/14/20 0807 04/14/20 0808  BP:      Pulse: 95     Resp:      Temp:      TempSrc:    (P) Oral  SpO2: 96% 97% 95%   Weight:      Height:       No intake or output data in the 24 hours ending 04/14/20 0849 Weight change:  Exam:   General:  Pt is alert, follows commands appropriately, not in acute distress  HEENT: No icterus, No thrush, No neck mass, [place]/AT  Cardiovascular: RRR, S1/S2, no rubs, no gallops  Respiratory: Bilateral scattered rales.  Bilateral wheezing.  Good air movement.  Abdomen: Soft/+BS, non tender,  non distended, no guarding  Extremities: No edema, No lymphangitis, No petechiae, No rashes, no synovitis   Data Reviewed: I have personally reviewed following labs and imaging studies Basic Metabolic Panel: Recent Labs  Lab 04/12/20 0932 04/13/20 0414 04/14/20 0433  NA 138 137 139  K 4.4 4.6 4.9  CL 96* 98 97*  CO2 35* 32 33*  GLUCOSE 122* 251* 178*   BUN 11 10 13   CREATININE 0.94 0.89 0.74  CALCIUM 8.7* 9.1 9.1  MG  --  2.0 2.1   Liver Function Tests: Recent Labs  Lab 04/13/20 0414 04/14/20 0433  AST 12* 10*  ALT 11 10  ALKPHOS 78 70  BILITOT 0.3 0.4  PROT 7.0 6.6  ALBUMIN 3.4* 3.3*   No results for input(s): LIPASE, AMYLASE in the last 168 hours. No results for input(s): AMMONIA in the last 168 hours. Coagulation Profile: No results for input(s): INR, PROTIME in the last 168 hours. CBC: Recent Labs  Lab 04/12/20 0932  WBC 9.5  NEUTROABS 6.5  HGB 15.9  HCT 53.7*  MCV 94.5  PLT 224   Cardiac Enzymes: No results for input(s): CKTOTAL, CKMB, CKMBINDEX, TROPONINI in the last 168 hours. BNP: Invalid input(s): POCBNP CBG: Recent Labs  Lab 04/13/20 0807 04/13/20 1132 04/13/20 1609 04/13/20 2210 04/14/20 0529  GLUCAP 181* 189* 149* 180* 167*   HbA1C: Recent Labs    04/12/20 0932  HGBA1C 6.6*   Urine analysis:    Component Value Date/Time   COLORURINE YELLOW 01/22/2020 1251   APPEARANCEUR CLEAR 01/22/2020 1251   LABSPEC 1.017 01/22/2020 1251   PHURINE 6.0 01/22/2020 1251   GLUCOSEU NEGATIVE 01/22/2020 1251   HGBUR NEGATIVE 01/22/2020 1251   BILIRUBINUR NEGATIVE 01/22/2020 1251   KETONESUR NEGATIVE 01/22/2020 1251   PROTEINUR NEGATIVE 01/22/2020 1251   UROBILINOGEN 0.2 10/21/2013 0200   NITRITE NEGATIVE 01/22/2020 1251   LEUKOCYTESUR NEGATIVE 01/22/2020 1251   Sepsis Labs: @LABRCNTIP (procalcitonin:4,lacticidven:4) ) Recent Results (from the past 240 hour(s))  SARS Coronavirus 2 by RT PCR (hospital order, performed in [place] hospital lab) Nasopharyngeal Nasopharyngeal Swab     Status: None   Collection Time: 04/12/20 10:07 AM   Specimen: Nasopharyngeal Swab  Result Value Ref Range Status   SARS Coronavirus 2 NEGATIVE NEGATIVE Final    Comment: (NOTE) SARS-CoV-2 target nucleic acids are NOT DETECTED.  The SARS-CoV-2 RNA is generally detectable in upper and lower respiratory specimens  during the acute phase of infection. The lowest concentration of SARS-CoV-2 viral copies this assay can detect is 250 copies / mL. A negative result does not preclude SARS-CoV-2 infection and should not be used as the sole basis for treatment or other patient management decisions.  A negative result may occur with improper specimen collection / handling, submission of specimen other than nasopharyngeal swab, presence of viral mutation(s) within the areas targeted by this assay, and inadequate number of viral copies (<250 copies / mL). A negative result must be combined with clinical observations, patient history, and epidemiological information.  Fact Sheet for Patients:   StrictlyIdeas.no  Fact Sheet for Healthcare Providers: BankingDealers.co.za  This test is not yet approved or  cleared by the [place] FDA and has been authorized for detection and/or diagnosis of SARS-CoV-2 by FDA under an Emergency Use Authorization (EUA).  This EUA will remain in effect (meaning this test can be used) for the duration of the COVID-19 declaration under Section 564(b)(1) of the Act, 21 U.S.C. section 360bbb-3(b)(1), unless the authorization is terminated or revoked  sooner.  Performed at [facility], [address], [place], [place] [address]   MRSA PCR Screening     Status: None   Collection Time: 04/12/20  7:30 PM   Specimen: Nasal Mucosa; Nasopharyngeal  Result Value Ref Range Status   MRSA by PCR NEGATIVE NEGATIVE Final    Comment:        The GeneXpert MRSA Assay (FDA approved for NASAL specimens only), is one component of a comprehensive MRSA colonization surveillance program. It is not intended to diagnose MRSA infection nor to guide or monitor treatment for MRSA infections. Performed at [facility], [address], [place], [place] [address]      Scheduled Meds: . Chlorhexidine Gluconate Cloth  6 each Topical Daily  .  enoxaparin (LOVENOX) injection  40 mg Subcutaneous Q24H  . fluticasone furoate-vilanterol  1 puff Inhalation Daily   Or  . umeclidinium bromide  1 puff Inhalation Daily  . guaiFENesin  1,200 mg Oral BID  . insulin aspart  0-15 Units Subcutaneous TID WC  . insulin aspart  0-5 Units Subcutaneous QHS  . insulin aspart  5 Units Subcutaneous TID WC  . ipratropium-albuterol  3 mL Nebulization TID  . mouth rinse  15 mL Mouth Rinse BID  . methylPREDNISolone (SOLU-MEDROL) injection  60 mg Intravenous Q6H  . nicotine  21 mg Transdermal Daily   Continuous Infusions: . doxycycline (VIBRAMYCIN) IV 100 mg (04/14/20 0537)    Procedures/Studies: DG Chest Port 1 View  Result Date: 04/12/2020 CLINICAL DATA:  Shortness of breath EXAM: PORTABLE CHEST 1 VIEW COMPARISON:  January 24, 2020 FINDINGS: There is mild bibasilar atelectasis. Lungs otherwise are clear. Heart is mildly enlarged with pulmonary venous hypertension. No adenopathy. No bone lesions. IMPRESSION: Slight bibasilar atelectasis. No edema or airspace opacity. There is cardiomegaly with a degree of pulmonary vascular congestion. No evident adenopathy. Electronically Signed   By: Lowella Grip III M.D.   On: 04/12/2020 09:58    [name], DO  [facility]  If 7PM-7AM, please contact night-coverage [email or web address] Password [facility] 04/14/2020, 8:49 AM   LOS: 2 days

## 2020-04-14 NOTE — Progress Notes (Signed)
CRITICAL VALUE ALERT  Critical Value:  Positive blood cultures for gram + cocci in chains  Date & Time Notied:  04/14/20 0520  Provider Notified: Olevia Bowens  Orders Received/Actions taken: no new orders

## 2020-04-14 NOTE — Plan of Care (Signed)

## 2020-04-15 LAB — COMPREHENSIVE METABOLIC PANEL
ALT: 10 U/L (ref 0–44)
AST: 9 U/L — ABNORMAL LOW (ref 15–41)
Albumin: 3.2 g/dL — ABNORMAL LOW (ref 3.5–5.0)
Alkaline Phosphatase: 62 U/L (ref 38–126)
Anion gap: 4 — ABNORMAL LOW (ref 5–15)
BUN: 13 mg/dL (ref 6–20)
CO2: 37 mmol/L — ABNORMAL HIGH (ref 22–32)
Calcium: 8.8 mg/dL — ABNORMAL LOW (ref 8.9–10.3)
Chloride: 98 mmol/L (ref 98–111)
Creatinine, Ser: 0.72 mg/dL (ref 0.61–1.24)
GFR calc Af Amer: >60 mL/min (ref >60)
GFR calc non Af Amer: >60 mL/min (ref >60)
Glucose, Bld: 135 mg/dL — ABNORMAL HIGH (ref 70–99)
Potassium: 4.5 mmol/L (ref 3.5–5.1)
Sodium: 139 mmol/L (ref 135–145)
Total Bilirubin: 0.5 mg/dL (ref 0.3–1.2)
Total Protein: 6.3 g/dL — ABNORMAL LOW (ref 6.5–8.1)

## 2020-04-15 LAB — MAGNESIUM: Magnesium: 1.9 mg/dL (ref 1.7–2.4)

## 2020-04-15 LAB — GLUCOSE, CAPILLARY
Glucose-Capillary: 149 mg/dL — ABNORMAL HIGH (ref 70–99)
Glucose-Capillary: 128 mg/dL — ABNORMAL HIGH (ref 70–99)
Glucose-Capillary: 208 mg/dL — ABNORMAL HIGH (ref 70–99)
Glucose-Capillary: 305 mg/dL — ABNORMAL HIGH (ref 70–99)
Glucose-Capillary: 319 mg/dL — ABNORMAL HIGH (ref 70–99)

## 2020-04-15 NOTE — Progress Notes (Signed)
PROGRESS NOTE  Miguel Marsh IHW:[id number] DOB: May 27, 1961 DOA: 04/12/2020 PCP: Miguel Court, DO   Brief History:  59 y.o.malewith medical history ofmetastatic non-small cell lung cancer in remission, hypertension, chronic respiratory failure on 3L, GERD, COPD, tobacco abuse presented with sob x 5 days.  He continues to smoke 2 cigarettes per day. He has a nearly 80 pack year history.He denied any fevers, chills, nausea, vomiting, diarrhea, abdominal pain, dysuria, hematuria. He complains of his usual right lower chest pain.  Assessment/Plan: Acute on chronic respiratory failure with hypoxiaand hypercarbia -Initially on 8 Lnasal cannula>>3.5L -Normallyon 3 L,24/7 -Wean oxygen for saturation88-92%  COPD exacerbation -StartPulmicort -continue IV solumedrol -Continue duo nebsduring hospitalization -continue brovana  Malignancy related pain -PMP Aware queried--receives monthly percocet 10/325 #120 from K. Georgina Quint, DNP -EKG--neg for ischemic changes -pt has had chronic Right lower chest, RUQ pain since his surgery 12/04/17 -10/20/19 CT chest--Scattered small pulmonary cysts. Diffuse bronchial wall thickening suggests airway predominant COPD. Fusiform bronchiectasis within both lower lobes, with areas of mucus plugging again noted in the periphery of the right lower lobe. 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, mediastinal mass excision and lymph node dissection with nerve sheath tumor resection 12/04/2017 at [facility] -follow up cardiothoracic at [facility]  Essential hypertension -BP remains controlled -continue HCTZ  Medication inducedHyperglycemia/Impaired glucose tolerance -2/12/21hemoglobin A1c--6.4 -04/12/20 A1C--6.6 -start novolog  ISS -elevated CBG partly due to steroids -carb modified diet  Class 2 obesity -KJZ79.15 -Lifestyle modification  Tobacco abuse -cessation discussed  Goals of Care -palliative medicine following -had GOC discussion-->now DNR     Status is: Inpatient  Remains inpatient appropriate because:IV treatments appropriate due to intensity of illness or inability to take PO   Dispo: The patient is from: Home  Anticipated d/c is to: Home  Anticipated d/c date is: 1 days  Patient currently is not medically stable to d/c.        Family Communication:   no Family at bedside  Consultants:  none  Code Status:  DNR  DVT Prophylaxis: [place] Lovenox   Procedures: As Listed in Progress Note Above  Antibiotics: None     Subjective: Patient denies fevers, chills, headache, chest pain,  nausea, vomiting, diarrhea, abdominal pain, dysuria, hematuria, hematochezia, and melena.   Objective: Vitals:   04/15/20 0808 04/15/20 0811 04/15/20 1510 04/15/20 1541  BP:   (!) 154/76   Pulse:   85   Resp:   20   Temp:   98.7 F (37.1 C)   TempSrc:   Oral   SpO2: 94% 94% 96% 94%  Weight:      Height:        Intake/Output Summary (Last 24 hours) at 04/15/2020 1727 Last data filed at 04/15/2020 0540 Gross per 24 hour  Intake 300 ml  Output --  Net 300 ml   Weight change:  Exam:   General:  Pt is alert, follows commands appropriately, not in acute distress  HEENT: No icterus, No thrush, No neck mass, [place]/AT  Cardiovascular: RRR, S1/S2, no rubs, no gallops  Respiratory: CTA bilaterally, no wheezing, no crackles, no rhonchi  Abdomen: Soft/+BS, non tender, non distended, no guarding  Extremities: No edema, No lymphangitis, No petechiae, No rashes, no synovitis   Data Reviewed: I have personally reviewed following labs and  imaging studies Basic Metabolic Panel: Recent Labs  Lab 04/12/20 0932 04/13/20 0414  04/14/20 0433 04/15/20 0613  NA 138 137 139 139  K 4.4 4.6 4.9 4.5  CL 96* 98 97* 98  CO2 35* 32 33* 37*  GLUCOSE 122* 251* 178* 135*  BUN 11 10 13 13   CREATININE 0.94 0.89 0.74 0.72  CALCIUM 8.7* 9.1 9.1 8.8*  MG  --  2.0 2.1 1.9   Liver Function Tests: Recent Labs  Lab 04/13/20 0414 04/14/20 0433 04/15/20 0613  AST 12* 10* 9*  ALT 11 10 10   ALKPHOS 78 70 62  BILITOT 0.3 0.4 0.5  PROT 7.0 6.6 6.3*  ALBUMIN 3.4* 3.3* 3.2*   No results for input(s): LIPASE, AMYLASE in the last 168 hours. No results for input(s): AMMONIA in the last 168 hours. Coagulation Profile: No results for input(s): INR, PROTIME in the last 168 hours. CBC: Recent Labs  Lab 04/12/20 0932  WBC 9.5  NEUTROABS 6.5  HGB 15.9  HCT 53.7*  MCV 94.5  PLT 224   Cardiac Enzymes: No results for input(s): CKTOTAL, CKMB, CKMBINDEX, TROPONINI in the last 168 hours. BNP: Invalid input(s): POCBNP CBG: Recent Labs  Lab 04/14/20 2041 04/15/20 0418 04/15/20 0751 04/15/20 1107 04/15/20 1725  GLUCAP 229* 149* 128* 208* 305*   HbA1C: No results for input(s): HGBA1C in the last 72 hours. Urine analysis:    Component Value Date/Time   COLORURINE YELLOW 01/22/2020 1251   APPEARANCEUR CLEAR 01/22/2020 1251   LABSPEC 1.017 01/22/2020 1251   PHURINE 6.0 01/22/2020 1251   GLUCOSEU NEGATIVE 01/22/2020 1251   HGBUR NEGATIVE 01/22/2020 1251   BILIRUBINUR NEGATIVE 01/22/2020 1251   KETONESUR NEGATIVE 01/22/2020 1251   PROTEINUR NEGATIVE 01/22/2020 1251   UROBILINOGEN 0.2 10/21/2013 0200   NITRITE NEGATIVE 01/22/2020 1251   LEUKOCYTESUR NEGATIVE 01/22/2020 1251   Sepsis Labs: @LABRCNTIP (procalcitonin:4,lacticidven:4) ) Recent Results (from the past 240 hour(s))  SARS Coronavirus 2 by RT PCR (hospital order, performed in [place] hospital lab) Nasopharyngeal Nasopharyngeal Swab     Status: None   Collection Time: 04/12/20 10:07 AM   Specimen: Nasopharyngeal Swab  Result Value Ref Range Status    SARS Coronavirus 2 NEGATIVE NEGATIVE Final    Comment: (NOTE) SARS-CoV-2 target nucleic acids are NOT DETECTED.  The SARS-CoV-2 RNA is generally detectable in upper and lower respiratory specimens during the acute phase of infection. The lowest concentration of SARS-CoV-2 viral copies this assay can detect is 250 copies / mL. A negative result does not preclude SARS-CoV-2 infection and should not be used as the sole basis for treatment or other patient management decisions.  A negative result may occur with improper specimen collection / handling, submission of specimen other than nasopharyngeal swab, presence of viral mutation(s) within the areas targeted by this assay, and inadequate number of viral copies (<250 copies / mL). A negative result must be combined with clinical observations, patient history, and epidemiological information.  Fact Sheet for Patients:   StrictlyIdeas.no  Fact Sheet for Healthcare Providers: BankingDealers.co.za  This test is not yet approved or  cleared by the [place] FDA and has been authorized for detection and/or diagnosis of SARS-CoV-2 by FDA under an Emergency Use Authorization (EUA).  This EUA will remain in effect (meaning this test can be used) for the duration of the COVID-19 declaration under Section 564(b)(1) of the Act, 21 U.S.C. section 360bbb-3(b)(1), unless the authorization is terminated or revoked sooner.  Performed at [facility], [address]  [place], [place] [address]   MRSA PCR Screening     Status: None   Collection Time: 04/12/20  7:30 PM   Specimen: Nasal Mucosa; Nasopharyngeal  Result Value Ref Range Status   MRSA by PCR NEGATIVE NEGATIVE Final    Comment:        The GeneXpert MRSA Assay (FDA approved for NASAL specimens only), is one component of a comprehensive MRSA colonization surveillance program. It is not intended to diagnose MRSA infection nor to guide  or monitor treatment for MRSA infections. Performed at [facility], [address], [place], [place] [address]      Scheduled Meds: . arformoterol  15 mcg Nebulization BID  . budesonide (PULMICORT) nebulizer solution  0.5 mg Nebulization BID  . Chlorhexidine Gluconate Cloth  6 each Topical Daily  . enoxaparin (LOVENOX) injection  40 mg Subcutaneous Q24H  . guaiFENesin  1,200 mg Oral BID  . insulin aspart  0-15 Units Subcutaneous TID WC  . insulin aspart  0-5 Units Subcutaneous QHS  . insulin aspart  5 Units Subcutaneous TID WC  . ipratropium-albuterol  3 mL Nebulization Q6H  . mouth rinse  15 mL Mouth Rinse BID  . methylPREDNISolone (SOLU-MEDROL) injection  60 mg Intravenous Q8H  . nicotine  14 mg Transdermal Daily   Continuous Infusions:  Procedures/Studies: DG Chest Port 1 View  Result Date: 04/12/2020 CLINICAL DATA:  Shortness of breath EXAM: PORTABLE CHEST 1 VIEW COMPARISON:  January 24, 2020 FINDINGS: There is mild bibasilar atelectasis. Lungs otherwise are clear. Heart is mildly enlarged with pulmonary venous hypertension. No adenopathy. No bone lesions. IMPRESSION: Slight bibasilar atelectasis. No edema or airspace opacity. There is cardiomegaly with a degree of pulmonary vascular congestion. No evident adenopathy. Electronically Signed   By: Lowella Grip III M.D.   On: 04/12/2020 09:58    [name], DO  [facility]  If 7PM-7AM, please contact night-coverage [email or web address] Password [email or web address] 04/15/2020, 5:27 PM   LOS: 3 days

## 2020-04-15 NOTE — Progress Notes (Addendum)
Palliative: Miguel Marsh, Miguel Marsh, is sitting up on the edge of the bed in his room.  He greets me making and keeping eye contact.  He is alert and oriented, calm and cooperative, able to make his needs known.  He appears chronically ill, but no work of breathing noted.  Miguel Marsh states that he is feeling better, he used the BiPAP some yesterday, but did not feel that he needed it overnight.  He has no complaints at this time.  We talked about the treatment plan in detail including, but not limited to, medical treatments being offered, outpatient follow-ups, services at home, [facility] POA and [place].  Miguel Marsh tells me that he has the contact information for his CAP aid and Antavius will contact her to let her know about resuming services.  Miguel Marsh tells me that when his heart naturally stops he would not want to attempted resuscitation, but he wants to continue these discussions with his brother who he would like to name is healthcare power of attorney.  We talked about some "numbers" about survival rates after CPR.  He does want to remain DNR at this time.  We talked about healthcare power of attorney.  Miguel Marsh tells me he would want to speak with his brother Miguel Marsh before he completes power of attorney paperwork.  Paperwork packet given.  [name] seems knowledgeable stating that all he needs is a Patent examiner.  I confirm this.  Conference with attending, bedside nursing staff, transition of care team related to goals of care, disposition, CODE STATUS discussions.  Plan: Continue to treat the treatable.  Return home with  CAP aid services already in place.  Follow-up with [facility] every 3 months for CT scan. CODE STATUS/HC POA: Miguel Marsh tells me clearly that when his heart naturally stops, he does not want attempted resuscitation.  Miguel Marsh is considering further advanced directives/healthcare power of attorney.  33 minutes Quinn Axe, NP Palliative Medicine Team Team Phone #  [phone] Greater than 50% of this time was spent counseling and coordinating care related to the above assessment and plan.

## 2020-04-16 LAB — MAGNESIUM: Magnesium: 2.1 mg/dL (ref 1.7–2.4)

## 2020-04-16 LAB — GLUCOSE, CAPILLARY
Glucose-Capillary: 270 mg/dL — ABNORMAL HIGH (ref 70–99)
Glucose-Capillary: 251 mg/dL — ABNORMAL HIGH (ref 70–99)

## 2020-04-16 LAB — COMPREHENSIVE METABOLIC PANEL
ALT: 14 U/L (ref 0–44)
AST: 9 U/L — ABNORMAL LOW (ref 15–41)
Albumin: 3.3 g/dL — ABNORMAL LOW (ref 3.5–5.0)
Alkaline Phosphatase: 111 U/L (ref 38–126)
Anion gap: 8 (ref 5–15)
BUN: 14 mg/dL (ref 6–20)
CO2: 36 mmol/L — ABNORMAL HIGH (ref 22–32)
Calcium: 8.9 mg/dL (ref 8.9–10.3)
Chloride: 95 mmol/L — ABNORMAL LOW (ref 98–111)
Creatinine, Ser: 0.85 mg/dL (ref 0.61–1.24)
GFR calc Af Amer: >60 mL/min (ref >60)
GFR calc non Af Amer: >60 mL/min (ref >60)
Glucose, Bld: 267 mg/dL — ABNORMAL HIGH (ref 70–99)
Potassium: 4.6 mmol/L (ref 3.5–5.1)
Sodium: 139 mmol/L (ref 135–145)
Total Bilirubin: 0.5 mg/dL (ref 0.3–1.2)
Total Protein: 6.3 g/dL — ABNORMAL LOW (ref 6.5–8.1)

## 2020-04-16 MED ORDER — INSULIN ASPART 100 UNIT/ML ~~LOC~~ SOLN: 0.0000 [IU] | Freq: Every day | SUBCUTANEOUS | Status: DC

## 2020-04-16 MED ORDER — INSULIN ASPART 100 UNIT/ML ~~LOC~~ SOLN: 0.0000 [IU] | Freq: Three times a day (TID) | SUBCUTANEOUS | Status: DC

## 2020-04-16 MED ORDER — LORAZEPAM 0.5 MG PO TABS: 0.5000 mg | ORAL_TABLET | Freq: Four times a day (QID) | ORAL | 0 refills | Status: DC | PRN

## 2020-04-16 MED ORDER — PREDNISONE 10 MG PO TABS: 60.0000 mg | ORAL_TABLET | Freq: Every day | ORAL | 0 refills | Status: DC

## 2020-04-16 MED ORDER — INSULIN ASPART 100 UNIT/ML ~~LOC~~ SOLN: 8.0000 [IU] | Freq: Three times a day (TID) | SUBCUTANEOUS | Status: DC

## 2020-04-16 MED ORDER — PREDNISONE 20 MG PO TABS: 60.0000 mg | ORAL_TABLET | Freq: Every day | ORAL | Status: DC

## 2020-04-16 NOTE — Progress Notes (Signed)
Inpatient Diabetes Program Recommendations  AACE/ADA: New Consensus Statement on Inpatient Glycemic Control (2015)  Target Ranges:  Prepandial:   less than 140 mg/dL      Peak postprandial:   less than 180 mg/dL (1-2 hours)      Critically ill patients:  140 - 180 mg/dL   Lab Results  Component Value Date   GLUCAP 251 (H) 04/16/2020   HGBA1C 6.6 (H) 04/12/2020    Review of Glycemic Control Results for Miguel Marsh, Miguel Marsh (MRN [id number]) as of 04/16/2020 09:11  Ref. Range 04/15/2020 11:07 04/15/2020 17:25 04/15/2020 20:46 04/16/2020 04:35 04/16/2020 07:41  Glucose-Capillary Latest Ref Range: 70 - 99 mg/dL 208 (H) 305 (H) 319 (H) 270 (H) 251 (H)   Inpatient Diabetes Program Recommendations:   -Increase Novolog correction to resistant -Increase Novolog meal coverage to 6-7 units tid if eats 50%  Thank you, Bethena Roys E. Nikiya Starn, RN, MSN, CDE  Diabetes Coordinator Inpatient Glycemic Control Team Team Pager [phone] (8am-5pm) 04/16/2020 9:12 AM

## 2020-04-16 NOTE — Progress Notes (Signed)
Nsg Discharge Note  Admit Date:  04/12/2020 Discharge date: 04/16/2020   Sharon Seller to be D/C'd Home per MD order.  AVS completed.  Copy for chart, and copy for patient signed, and dated. Patient/caregiver able to verbalize understanding.  Discharge Medication: Allergies as of 04/16/2020   No Known Allergies     Medication List    TAKE these medications   albuterol 108 (90 Base) MCG/ACT inhaler Commonly known as: VENTOLIN HFA Inhale 2 puffs into the lungs every 6 (six) hours as needed for wheezing.   albuterol (2.5 MG/3ML) 0.083% nebulizer solution Commonly known as: PROVENTIL Inhale 2.5 mg into the lungs every 6 (six) hours as needed for wheezing or shortness of breath.   guaiFENesin 600 MG 12 hr tablet Commonly known as: MUCINEX Take 1 tablet (600 mg total) by mouth 2 (two) times daily. What changed:   when to take this  reasons to take this   hydrochlorothiazide 12.5 MG tablet Commonly known as: HYDRODIURIL Take 1 tablet (12.5 mg total) by mouth daily. What changed:   when to take this  reasons to take this   ipratropium-albuterol 0.5-2.5 (3) MG/3ML Soln Commonly known as: DUONEB Take 3 mLs by nebulization every 4 (four) hours as needed. What changed: reasons to take this   LORazepam 0.5 MG tablet Commonly known as: ATIVAN Take 1 tablet (0.5 mg total) by mouth every 6 (six) hours as needed for anxiety.   Oxycodone HCl 10 MG Tabs Take 10 mg by mouth every 6 (six) hours as needed (pain).   OXYGEN Inhale 3 L into the lungs continuous.   predniSONE 10 MG tablet Commonly known as: DELTASONE Take 6 tablets (60 mg total) by mouth daily with breakfast. And decrease by one tablet daily Start taking on: April 17, 2020   Trelegy Ellipta 100-62.5-25 MCG/INH Aepb Generic drug: Fluticasone-Umeclidin-Vilant Inhale 1 puff into the lungs daily.       Discharge Assessment: Vitals:   04/16/20 0732 04/16/20 0737  BP:    Pulse:    Resp:    Temp:    SpO2:  96% 96%   Skin clean, dry and intact without evidence of skin break down, no evidence of skin tears noted. IV catheter discontinued intact. Site without signs and symptoms of complications - no redness or edema noted at insertion site, patient denies c/o pain - only slight tenderness at site.  Dressing with slight pressure applied.  D/c Instructions-Education: Discharge instructions given to patient/family with verbalized understanding. D/c education completed with patient/family including follow up instructions, medication list, d/c activities limitations if indicated, with other d/c instructions as indicated by MD - patient able to verbalize understanding, all questions fully answered. Patient instructed to return to ED, call 911, or call MD for any changes in condition.  Patient escorted via [place], and D/C home via private auto.  Zenaida Deed, RN 04/16/2020 12:05 PM

## 2020-04-16 NOTE — Progress Notes (Signed)
Nsg Discharge Note  Admit Date:  04/12/2020 Discharge date: 04/16/2020   Miguel Marsh to be D/C'd Home per MD order.  AVS completed.  Patient/caregiver able to verbalize understanding.  Discharge Medication: Allergies as of 04/16/2020   No Known Allergies     Medication List    TAKE these medications   albuterol 108 (90 Base) MCG/ACT inhaler Commonly known as: VENTOLIN HFA Inhale 2 puffs into the lungs every 6 (six) hours as needed for wheezing.   albuterol (2.5 MG/3ML) 0.083% nebulizer solution Commonly known as: PROVENTIL Inhale 2.5 mg into the lungs every 6 (six) hours as needed for wheezing or shortness of breath.   guaiFENesin 600 MG 12 hr tablet Commonly known as: MUCINEX Take 1 tablet (600 mg total) by mouth 2 (two) times daily. What changed:   when to take this  reasons to take this   hydrochlorothiazide 12.5 MG tablet Commonly known as: HYDRODIURIL Take 1 tablet (12.5 mg total) by mouth daily. What changed:   when to take this  reasons to take this   ipratropium-albuterol 0.5-2.5 (3) MG/3ML Soln Commonly known as: DUONEB Take 3 mLs by nebulization every 4 (four) hours as needed. What changed: reasons to take this   LORazepam 0.5 MG tablet Commonly known as: ATIVAN Take 1 tablet (0.5 mg total) by mouth every 6 (six) hours as needed for anxiety.   Oxycodone HCl 10 MG Tabs Take 10 mg by mouth every 6 (six) hours as needed (pain).   OXYGEN Inhale 3 L into the lungs continuous.   predniSONE 10 MG tablet Commonly known as: DELTASONE Take 6 tablets (60 mg total) by mouth daily with breakfast. And decrease by one tablet daily Start taking on: April 17, 2020   Trelegy Ellipta 100-62.5-25 MCG/INH Aepb Generic drug: Fluticasone-Umeclidin-Vilant Inhale 1 puff into the lungs daily.       Discharge Assessment: Vitals:   04/16/20 0732 04/16/20 0737  BP:    Pulse:    Resp:    Temp:    SpO2: 96% 96%   Skin clean, dry and intact without evidence of  skin break down, no evidence of skin tears noted. IV catheter discontinued intact. Site without signs and symptoms of complications - no redness or edema noted at insertion site, patient denies c/o pain - only slight tenderness at site.  Dressing with slight pressure applied.  D/c Instructions-Education: Discharge instructions given to patient/family with verbalized understanding. D/c education completed with patient/family including follow up instructions, medication list, d/c activities limitations if indicated, with other d/c instructions as indicated by MD - patient able to verbalize understanding, all questions fully answered. Patient instructed to return to ED, call 911, or call MD for any changes in condition.  Patient escorted via [place], and D/C home via private auto.  Ireland Chagnon C, RN 04/16/2020 11:10 AM

## 2020-04-16 NOTE — Discharge Summary (Signed)
Physician Discharge Summary  Miguel Marsh SEG:[id number] DOB: 03-28-1961 DOA: 04/12/2020  PCP: Scotty Court, DO  Admit date: 04/12/2020 Discharge date: 04/16/2020  Admitted From: Home Disposition:  Home   Recommendations for Outpatient Follow-up:  1. Follow up with PCP in 1-2 weeks 2. Please obtain BMP/CBC in one week    Discharge Condition: Stable CODE STATUS: DNR Diet recommendation: Heart Healthy / Carb Modified    Brief/Interim Summary: 59 y.o.malewith medical history ofmetastatic non-small cell lung cancer in remission, hypertension, chronic respiratory failure on3L, GERD, COPD, tobacco abusepresented with sob x 5 days. He continues to smoke 2 cigarettes per day. He has a nearly 80 pack year history.He denied any fevers, chills, nausea, vomiting, diarrhea, abdominal pain, dysuria, hematuria. He complains of his usual right lower chest pain.  Discharge Diagnoses:  Acute on chronic respiratory failure with hypoxiaand hypercarbia -Initially on8Lnasal cannula>>3.5L>>3L -Normallyon3L,24/7 -Wean oxygen for saturation88-92%  COPD exacerbation -StartPulmicort -continue IV solumedrol>>>d/c home with prednisone taper -Continue duo nebsduring hospitalization -continue brovana  Malignancy related pain -PMP Aware queried--receives monthly percocet 10/325 #120 from K. Georgina Quint, DNP -EKG--neg for ischemic changes -pt has had chronic Right lower chest, RUQ pain since his surgery 12/04/17 -10/20/19 CT chest--Scattered small pulmonary cysts. Diffuse bronchial wall thickening suggests airway predominant COPD. Fusiform bronchiectasis within both lower lobes, with areas of mucus plugging again noted in the periphery of the right lower lobe. 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, mediastinal mass excision and lymph node dissection with nerve sheath tumor resection 12/04/2017 at [facility] -follow up cardiothoracic at [facility]  Essential hypertension -BP remains controlled -continue HCTZ  Medication inducedHyperglycemia/Impaired glucose tolerance -2/12/21hemoglobin A1c--6.4 -04/12/20 A1C--6.6 -start novolog ISS -elevated CBGpartlydue to steroids -carb modified diet  Class 2obesity -BMI36.29 -Lifestyle modification  Tobacco abuse -cessation discussed  Goals of Care -palliative medicine following -had GOC discussion-->now DNR -ativan 0.5 mg, #10 prescribed at time of d/c, no RF    Discharge Instructions   Allergies as of 04/16/2020   No Known Allergies     Medication List    TAKE these medications   albuterol 108 (90 Base) MCG/ACT inhaler Commonly known as: VENTOLIN HFA Inhale 2 puffs into the lungs every 6 (six) hours as needed for wheezing.   albuterol (2.5 MG/3ML) 0.083% nebulizer solution Commonly known as: PROVENTIL Inhale 2.5 mg into the lungs every 6 (six) hours as needed for wheezing or shortness of breath.   guaiFENesin 600 MG 12 hr tablet Commonly known as: MUCINEX Take 1 tablet (600 mg total) by mouth 2 (two) times daily. What changed:   when to take this  reasons to take this   hydrochlorothiazide 12.5 MG tablet Commonly known as: HYDRODIURIL Take 1 tablet (12.5 mg total) by mouth daily. What changed:   when to take this  reasons to take this   ipratropium-albuterol 0.5-2.5 (3) MG/3ML Soln Commonly known as: DUONEB Take 3 mLs by nebulization every 4 (four) hours as needed. What changed: reasons to take this   LORazepam 0.5 MG tablet Commonly known as: ATIVAN Take 1 tablet (0.5 mg total) by mouth every 6 (six) hours as needed for anxiety.   Oxycodone HCl 10 MG Tabs Take 10 mg by mouth every 6 (six) hours as needed (pain).   OXYGEN Inhale 3 L into the lungs  continuous.   predniSONE 10 MG tablet  Commonly known as: DELTASONE Take 6 tablets (60 mg total) by mouth daily with breakfast. And decrease by one tablet daily Start taking on: April 17, 2020   Trelegy Ellipta 100-62.5-25 MCG/INH Aepb Generic drug: Fluticasone-Umeclidin-Vilant Inhale 1 puff into the lungs daily.       No Known Allergies  Consultations:  Palliative medicine   Procedures/Studies: DG Chest Port 1 View  Result Date: 04/12/2020 CLINICAL DATA:  Shortness of breath EXAM: PORTABLE CHEST 1 VIEW COMPARISON:  January 24, 2020 FINDINGS: There is mild bibasilar atelectasis. Lungs otherwise are clear. Heart is mildly enlarged with pulmonary venous hypertension. No adenopathy. No bone lesions. IMPRESSION: Slight bibasilar atelectasis. No edema or airspace opacity. There is cardiomegaly with a degree of pulmonary vascular congestion. No evident adenopathy. Electronically Signed   By: Lowella Grip III M.D.   On: 04/12/2020 09:58         Discharge Exam: Vitals:   04/16/20 0732 04/16/20 0737  BP:    Pulse:    Resp:    Temp:    SpO2: 96% 96%   Vitals:   04/16/20 0440 04/16/20 0727 04/16/20 0732 04/16/20 0737  BP: (!) 143/74     Pulse: 85     Resp: (!) 22     Temp: 98.2 F (36.8 C)     TempSrc: Oral     SpO2: 99% 96% 96% 96%  Weight:      Height:        General: Pt is alert, awake, not in acute distress Cardiovascular: RRR, S1/S2 +, no rubs, no gallops Respiratory: bibasilar rales. No wheeze Abdominal: Soft, NT, ND, bowel sounds + Extremities: trace LE edema, no cyanosis   The results of significant diagnostics from this hospitalization (including imaging, microbiology, ancillary and laboratory) are listed below for reference.    Significant Diagnostic Studies: DG Chest Port 1 View  Result Date: 04/12/2020 CLINICAL DATA:  Shortness of breath EXAM: PORTABLE CHEST 1 VIEW COMPARISON:  January 24, 2020 FINDINGS: There is mild bibasilar atelectasis. Lungs  otherwise are clear. Heart is mildly enlarged with pulmonary venous hypertension. No adenopathy. No bone lesions. IMPRESSION: Slight bibasilar atelectasis. No edema or airspace opacity. There is cardiomegaly with a degree of pulmonary vascular congestion. No evident adenopathy. Electronically Signed   By: Lowella Grip III M.D.   On: 04/12/2020 09:58     Microbiology: Recent Results (from the past 240 hour(s))  SARS Coronavirus 2 by RT PCR (hospital order, performed in [facility] hospital lab) Nasopharyngeal Nasopharyngeal Swab     Status: None   Collection Time: 04/12/20 10:07 AM   Specimen: Nasopharyngeal Swab  Result Value Ref Range Status   SARS Coronavirus 2 NEGATIVE NEGATIVE Final    Comment: (NOTE) SARS-CoV-2 target nucleic acids are NOT DETECTED.  The SARS-CoV-2 RNA is generally detectable in upper and lower respiratory specimens during the acute phase of infection. The lowest concentration of SARS-CoV-2 viral copies this assay can detect is 250 copies / mL. A negative result does not preclude SARS-CoV-2 infection and should not be used as the sole basis for treatment or other patient management decisions.  A negative result may occur with improper specimen collection / handling, submission of specimen other than nasopharyngeal swab, presence of viral mutation(s) within the areas targeted by this assay, and inadequate number of viral copies (<250 copies / mL). A negative result must be combined with clinical observations, patient history, and epidemiological information.  Fact Sheet for Patients:   StrictlyIdeas.no  Fact Sheet for Healthcare  Providers: BankingDealers.co.za  This test is not yet approved or  cleared by the [place] and has been authorized for detection and/or diagnosis of SARS-CoV-2 by FDA under an Emergency Use Authorization (EUA).  This EUA will remain in effect (meaning this test can be used) for  the duration of the COVID-19 declaration under Section 564(b)(1) of the Act, 21 U.S.C. section 360bbb-3(b)(1), unless the authorization is terminated or revoked sooner.  Performed at [facility], [address] [place], [place] [address]   MRSA PCR Screening     Status: None   Collection Time: 04/12/20  7:30 PM   Specimen: Nasal Mucosa; Nasopharyngeal  Result Value Ref Range Status   MRSA by PCR NEGATIVE NEGATIVE Final    Comment:        The GeneXpert MRSA Assay (FDA approved for NASAL specimens only), is one component of a comprehensive MRSA colonization surveillance program. It is not intended to diagnose MRSA infection nor to guide or monitor treatment for MRSA infections. Performed at [facility] [address], [place], [place] [address]      Labs: Basic Metabolic Panel: Recent Labs  Lab 04/12/20 0932 04/12/20 0932 04/13/20 0414 04/13/20 0414 04/14/20 0433 04/14/20 0433 04/15/20 0613 04/16/20 0616  NA 138  --  137  --  139  --  139 139  K 4.4   < > 4.6   < > 4.9   < > 4.5 4.6  CL 96*  --  98  --  97*  --  98 95*  CO2 35*  --  32  --  33*  --  37* 36*  GLUCOSE 122*  --  251*  --  178*  --  135* 267*  BUN 11  --  10  --  13  --  13 14  CREATININE 0.94  --  0.89  --  0.74  --  0.72 0.85  CALCIUM 8.7*  --  9.1  --  9.1  --  8.8* 8.9  MG  --   --  2.0  --  2.1  --  1.9 2.1   < > = values in this interval not displayed.   Liver Function Tests: Recent Labs  Lab 04/13/20 0414 04/14/20 0433 04/15/20 0613 04/16/20 0616  AST 12* 10* 9* 9*  ALT 11 10 10 14   ALKPHOS 78 70 62 111  BILITOT 0.3 0.4 0.5 0.5  PROT 7.0 6.6 6.3* 6.3*  ALBUMIN 3.4* 3.3* 3.2* 3.3*   No results for input(s): LIPASE, AMYLASE in the last 168 hours. No results for input(s): AMMONIA in the last 168 hours. CBC: Recent Labs  Lab 04/12/20 0932  WBC 9.5  NEUTROABS 6.5  HGB 15.9  HCT 53.7*  MCV 94.5  PLT 224   Cardiac Enzymes: No results for input(s): CKTOTAL, CKMB, CKMBINDEX,  TROPONINI in the last 168 hours. BNP: Invalid input(s): POCBNP CBG: Recent Labs  Lab 04/15/20 1107 04/15/20 1725 04/15/20 2046 04/16/20 0435 04/16/20 0741  GLUCAP 208* 305* 319* 270* 251*    Time coordinating discharge:  36 minutes  Signed:  [name], DO [facility] Pager: [phone] 04/16/2020, 10:58 AM

## 2020-08-29 ENCOUNTER — Other Ambulatory Visit: Payer: Self-pay

## 2020-08-29 ENCOUNTER — Emergency Department (HOSPITAL_COMMUNITY): Payer: Medicare HMO

## 2020-08-29 ENCOUNTER — Inpatient Hospital Stay (HOSPITAL_COMMUNITY)
Admission: EM | Admit: 2020-08-29 | Discharge: 2020-09-02 | DRG: 190 | Disposition: A | Discharge status: Home / Self Care | Payer: Medicare HMO | Attending: Internal Medicine | Admitting: Internal Medicine

## 2020-08-29 ENCOUNTER — Encounter (HOSPITAL_COMMUNITY): Payer: Self-pay | Admitting: Emergency Medicine

## 2020-08-29 DIAGNOSIS — I10 Essential (primary) hypertension: Secondary | ICD-10-CM | POA: Diagnosis not present

## 2020-08-29 DIAGNOSIS — Z20822 Contact with and (suspected) exposure to covid-19: Secondary | ICD-10-CM | POA: Diagnosis present

## 2020-08-29 DIAGNOSIS — R739 Hyperglycemia, unspecified: Secondary | ICD-10-CM | POA: Diagnosis present

## 2020-08-29 DIAGNOSIS — Z79899 Other long term (current) drug therapy: Secondary | ICD-10-CM

## 2020-08-29 DIAGNOSIS — Z85118 Personal history of other malignant neoplasm of bronchus and lung: Secondary | ICD-10-CM | POA: Diagnosis not present

## 2020-08-29 DIAGNOSIS — Z96649 Presence of unspecified artificial hip joint: Secondary | ICD-10-CM | POA: Diagnosis present

## 2020-08-29 DIAGNOSIS — J441 Chronic obstructive pulmonary disease with (acute) exacerbation: Secondary | ICD-10-CM | POA: Diagnosis present

## 2020-08-29 DIAGNOSIS — Z720 Tobacco use: Secondary | ICD-10-CM | POA: Diagnosis not present

## 2020-08-29 DIAGNOSIS — Z7951 Long term (current) use of inhaled steroids: Secondary | ICD-10-CM | POA: Diagnosis not present

## 2020-08-29 DIAGNOSIS — F419 Anxiety disorder, unspecified: Secondary | ICD-10-CM | POA: Diagnosis present

## 2020-08-29 DIAGNOSIS — Z9981 Dependence on supplemental oxygen: Secondary | ICD-10-CM | POA: Diagnosis not present

## 2020-08-29 DIAGNOSIS — F17210 Nicotine dependence, cigarettes, uncomplicated: Secondary | ICD-10-CM | POA: Diagnosis present

## 2020-08-29 DIAGNOSIS — J9621 Acute and chronic respiratory failure with hypoxia: Secondary | ICD-10-CM

## 2020-08-29 DIAGNOSIS — K219 Gastro-esophageal reflux disease without esophagitis: Secondary | ICD-10-CM | POA: Diagnosis present

## 2020-08-29 DIAGNOSIS — G894 Chronic pain syndrome: Secondary | ICD-10-CM | POA: Diagnosis present

## 2020-08-29 DIAGNOSIS — Z8249 Family history of ischemic heart disease and other diseases of the circulatory system: Secondary | ICD-10-CM | POA: Diagnosis not present

## 2020-08-29 DIAGNOSIS — M1990 Unspecified osteoarthritis, unspecified site: Secondary | ICD-10-CM | POA: Diagnosis present

## 2020-08-29 DIAGNOSIS — R0602 Shortness of breath: Secondary | ICD-10-CM | POA: Diagnosis not present

## 2020-08-29 LAB — CBC WITH DIFFERENTIAL/PLATELET
Abs Immature Granulocytes: 0.01 10*3/uL (ref 0.00–0.07)
Basophils Absolute: 0 10*3/uL (ref 0.0–0.1)
Basophils Relative: 1 %
Eosinophils Absolute: 0.1 10*3/uL (ref 0.0–0.5)
Eosinophils Relative: 1 %
HCT: 48.9 % (ref 39.0–52.0)
Hemoglobin: 15.5 g/dL (ref 13.0–17.0)
Immature Granulocytes: 0 %
Lymphocytes Relative: 31 %
Lymphs Abs: 1.9 10*3/uL (ref 0.7–4.0)
MCH: 29.9 pg (ref 26.0–34.0)
MCHC: 31.7 g/dL (ref 30.0–36.0)
MCV: 94.2 fL (ref 80.0–100.0)
Monocytes Absolute: 0.6 10*3/uL (ref 0.1–1.0)
Monocytes Relative: 10 %
Neutro Abs: 3.6 10*3/uL (ref 1.7–7.7)
Neutrophils Relative %: 57 %
Platelets: 239 10*3/uL (ref 150–400)
RBC: 5.19 MIL/uL (ref 4.22–5.81)
RDW: 12 % (ref 11.5–15.5)
WBC: 6.3 10*3/uL (ref 4.0–10.5)
nRBC: 0 % (ref 0.0–0.2)

## 2020-08-29 LAB — BASIC METABOLIC PANEL
Anion gap: 8 (ref 5–15)
BUN: 8 mg/dL (ref 6–20)
CO2: 32 mmol/L (ref 22–32)
Calcium: 9.1 mg/dL (ref 8.9–10.3)
Chloride: 97 mmol/L — ABNORMAL LOW (ref 98–111)
Creatinine, Ser: 0.8 mg/dL (ref 0.61–1.24)
GFR, Estimated: >60 mL/min (ref >60)
Glucose, Bld: 122 mg/dL — ABNORMAL HIGH (ref 70–99)
Potassium: 4.1 mmol/L (ref 3.5–5.1)
Sodium: 137 mmol/L (ref 135–145)

## 2020-08-29 LAB — RESPIRATORY PANEL BY RT PCR (FLU A&B, COVID)
Influenza A by PCR: NEGATIVE
Influenza B by PCR: NEGATIVE
SARS Coronavirus 2 by RT PCR: NEGATIVE

## 2020-08-29 LAB — HEMOGLOBIN A1C
Hgb A1c MFr Bld: 6 % — ABNORMAL HIGH (ref 4.8–5.6)
Mean Plasma Glucose: 125.5 mg/dL

## 2020-08-29 LAB — GLUCOSE, CAPILLARY: Glucose-Capillary: 214 mg/dL — ABNORMAL HIGH (ref 70–99)

## 2020-08-29 LAB — CBG MONITORING, ED: Glucose-Capillary: 206 mg/dL — ABNORMAL HIGH (ref 70–99)

## 2020-08-29 MED ORDER — DOXYCYCLINE HYCLATE 100 MG PO TABS
100.0000 mg | ORAL_TABLET | Freq: Two times a day (BID) | ORAL | Status: DC
  Administered 2020-08-29 – 2020-09-02 (×9): 100 mg via ORAL
  Filled 2020-08-29 (×9): qty 1

## 2020-08-29 MED ORDER — METHYLPREDNISOLONE SODIUM SUCC 125 MG IJ SOLR
125.0000 mg | Freq: Once | INTRAMUSCULAR | Status: AC
  Administered 2020-08-29: 125 mg via INTRAVENOUS
  Filled 2020-08-29: qty 2

## 2020-08-29 MED ORDER — NICOTINE 14 MG/24HR TD PT24
14.0000 mg | MEDICATED_PATCH | Freq: Every day | TRANSDERMAL | Status: DC
  Filled 2020-08-29 (×3): qty 1

## 2020-08-29 MED ORDER — METHYLPREDNISOLONE SODIUM SUCC 125 MG IJ SOLR
60.0000 mg | Freq: Three times a day (TID) | INTRAMUSCULAR | Status: AC
  Administered 2020-08-30 (×2): 60 mg via INTRAVENOUS
  Filled 2020-08-29 (×2): qty 2

## 2020-08-29 MED ORDER — LORAZEPAM 0.5 MG PO TABS: 0.5000 mg | ORAL_TABLET | Freq: Three times a day (TID) | ORAL | Status: DC | PRN

## 2020-08-29 MED ORDER — INSULIN ASPART 100 UNIT/ML ~~LOC~~ SOLN
0.0000 [IU] | Freq: Three times a day (TID) | SUBCUTANEOUS | Status: DC
  Administered 2020-08-29: 5 [IU] via SUBCUTANEOUS
  Administered 2020-08-30: 2 [IU] via SUBCUTANEOUS
  Administered 2020-08-30 – 2020-09-01 (×6): 3 [IU] via SUBCUTANEOUS
  Administered 2020-09-02: 5 [IU] via SUBCUTANEOUS
  Filled 2020-08-29: qty 1

## 2020-08-29 MED ORDER — IPRATROPIUM-ALBUTEROL 0.5-2.5 (3) MG/3ML IN SOLN
3.0000 mL | Freq: Once | RESPIRATORY_TRACT | Status: DC
  Filled 2020-08-29: qty 3

## 2020-08-29 MED ORDER — ALBUTEROL (5 MG/ML) CONTINUOUS INHALATION SOLN
10.0000 mg/h | INHALATION_SOLUTION | Freq: Once | RESPIRATORY_TRACT | Status: AC
  Administered 2020-08-29: 10 mg/h via RESPIRATORY_TRACT
  Filled 2020-08-29: qty 20

## 2020-08-29 MED ORDER — MAGNESIUM SULFATE 2 GM/50ML IV SOLN
2.0000 g | Freq: Once | INTRAVENOUS | Status: AC
  Administered 2020-08-29: 2 g via INTRAVENOUS
  Filled 2020-08-29: qty 50

## 2020-08-29 MED ORDER — OXYCODONE HCL 5 MG PO TABS
10.0000 mg | ORAL_TABLET | Freq: Three times a day (TID) | ORAL | Status: DC | PRN
  Administered 2020-08-29 – 2020-09-02 (×7): 10 mg via ORAL
  Filled 2020-08-29 (×7): qty 2

## 2020-08-29 MED ORDER — ALBUTEROL SULFATE HFA 108 (90 BASE) MCG/ACT IN AERS
8.0000 | INHALATION_SPRAY | Freq: Once | RESPIRATORY_TRACT | Status: AC
  Administered 2020-08-29: 8 via RESPIRATORY_TRACT
  Filled 2020-08-29: qty 6.7

## 2020-08-29 MED ORDER — SODIUM CHLORIDE 0.9 % IV SOLN
INTRAVENOUS | Status: DC
  Administered 2020-08-29: 50 mL/h via INTRAVENOUS

## 2020-08-29 MED ORDER — ENOXAPARIN SODIUM 40 MG/0.4ML ~~LOC~~ SOLN
40.0000 mg | SUBCUTANEOUS | Status: DC
  Administered 2020-08-29 – 2020-09-01 (×4): 40 mg via SUBCUTANEOUS
  Filled 2020-08-29 (×4): qty 0.4

## 2020-08-29 MED ORDER — INSULIN ASPART 100 UNIT/ML ~~LOC~~ SOLN
0.0000 [IU] | Freq: Every day | SUBCUTANEOUS | Status: DC
  Administered 2020-08-29: 2 [IU] via SUBCUTANEOUS
  Administered 2020-09-01: 3 [IU] via SUBCUTANEOUS

## 2020-08-29 MED ORDER — IPRATROPIUM-ALBUTEROL 0.5-2.5 (3) MG/3ML IN SOLN
3.0000 mL | Freq: Four times a day (QID) | RESPIRATORY_TRACT | Status: DC
  Administered 2020-08-29 – 2020-09-02 (×15): 3 mL via RESPIRATORY_TRACT
  Filled 2020-08-29 (×15): qty 3

## 2020-08-29 MED ORDER — AEROCHAMBER PLUS FLO-VU MISC
1.0000 | Freq: Once | Status: AC
  Administered 2020-08-29: 1

## 2020-08-29 MED ORDER — INSULIN DETEMIR 100 UNIT/ML ~~LOC~~ SOLN
5.0000 [IU] | Freq: Every day | SUBCUTANEOUS | Status: DC
  Administered 2020-08-29 – 2020-09-01 (×4): 5 [IU] via SUBCUTANEOUS
  Filled 2020-08-29 (×5): qty 0.05

## 2020-08-29 MED ORDER — ALBUTEROL SULFATE (2.5 MG/3ML) 0.083% IN NEBU: 2.5000 mg | INHALATION_SOLUTION | RESPIRATORY_TRACT | Status: DC | PRN

## 2020-08-29 MED ORDER — IPRATROPIUM-ALBUTEROL 0.5-2.5 (3) MG/3ML IN SOLN: 3.0000 mL | RESPIRATORY_TRACT | Status: DC

## 2020-08-29 MED ORDER — METHYLPREDNISOLONE SODIUM SUCC 125 MG IJ SOLR: 60.0000 mg | Freq: Three times a day (TID) | INTRAMUSCULAR | Status: DC

## 2020-08-29 MED ORDER — BUDESONIDE 0.5 MG/2ML IN SUSP
0.5000 mg | Freq: Two times a day (BID) | RESPIRATORY_TRACT | Status: DC
  Administered 2020-08-29 – 2020-09-02 (×8): 0.5 mg via RESPIRATORY_TRACT
  Filled 2020-08-29 (×8): qty 2

## 2020-08-29 MED ORDER — IPRATROPIUM BROMIDE 0.02 % IN SOLN
0.5000 mg | Freq: Once | RESPIRATORY_TRACT | Status: AC
  Administered 2020-08-29: 0.5 mg via RESPIRATORY_TRACT
  Filled 2020-08-29: qty 2.5

## 2020-08-29 MED ORDER — ARFORMOTEROL TARTRATE 15 MCG/2ML IN NEBU
15.0000 ug | INHALATION_SOLUTION | Freq: Two times a day (BID) | RESPIRATORY_TRACT | Status: DC
  Administered 2020-08-29 – 2020-09-01 (×7): 15 ug via RESPIRATORY_TRACT
  Filled 2020-08-29 (×7): qty 2

## 2020-08-29 MED ORDER — DM-GUAIFENESIN ER 30-600 MG PO TB12
1.0000 | ORAL_TABLET | Freq: Two times a day (BID) | ORAL | Status: DC
  Administered 2020-08-29 – 2020-09-02 (×9): 1 via ORAL
  Filled 2020-08-29 (×9): qty 1

## 2020-08-29 NOTE — ED Notes (Signed)
Report given to det 300 RN.

## 2020-08-29 NOTE — H&P (Signed)
History and Physical    Miguel Marsh QQP:[id number] DOB: [birth year] DOA: 08/29/2020  PCP: Adaline Sill, NP   Patient coming from: home  I have personally briefly reviewed patient's old medical records in [place]  Chief Complaint: Shortness of breath  HPI: Miguel Marsh is a 59 y.o. male with medical history significant of chronic bronchitis, metastatic non-small cell lung cancer (followed [facility] and currently in remission), hypertension, chronic pain syndrome, anxiety and COPD with chronic respiratory failure (3 L oxygen supplementation at baseline); who presented to the hospital secondary to worsening his shortness of breath.  He reports that for the last 4 days he has been experiencing difficulty breathing and the symptom has continued worsening.  He expressed associated intermittent productive coughing spells and no improvement in his symptoms by the use of inhalers/nebulizer at home.  No fever, no chills, no nausea, no vomiting, no focal weakness, no abdominal pain, no dysuria, no hematuria, no melena, no hematochezia or any other complaints.  Of note, patient continues to smoke 1 to 2 cigarettes/day and expressed to be vaccinated against Covid.  Respiratory panel negative for influenza and COVID-19.  ED Course: Chest x-ray without frank infiltrates, poor air movement appreciated on physical examination and increased wheezing.  Patient requiring higher level of oxygen supplementation than his baseline.  TRH has been called to me patient for further evaluation and management of acute on chronic respiratory failure with hypoxia due to COPD exacerbation.  Review of Systems: As per HPI otherwise all other systems reviewed and are negative.   Past Medical History:  Diagnosis Date  . Arthritis   . Bronchitis   . Cancer [facility])    metastatic NSCLC (followed by [facility])  . COPD (chronic obstructive pulmonary disease) ([place])   . HTN (hypertension)     Past Surgical  History:  Procedure Laterality Date  . LUNG BIOPSY    . TOTAL HIP ARTHROPLASTY    . TUMOR REMOVAL Right 12/04/2017    Social History  reports that he has quit smoking. His smoking use included cigarettes. He smoked 0.10 packs per day. He has never used smokeless tobacco. He reports current alcohol use. He reports that he does not use drugs.  No Known Allergies  Family history: -Patient reports history of hypertension  Prior to Admission medications   Medication Sig Start Date End Date Taking? Authorizing Provider  albuterol (PROVENTIL HFA;VENTOLIN HFA) 108 (90 Base) MCG/ACT inhaler Inhale 2 puffs into the lungs every 6 (six) hours as needed for wheezing. 07/17/18  Yes Roxan Hockey, MD  albuterol (PROVENTIL) (2.5 MG/3ML) 0.083% nebulizer solution Inhale 2.5 mg into the lungs every 6 (six) hours as needed for wheezing or shortness of breath.  01/19/20  Yes [provider]  Fluticasone-Umeclidin-Vilant (TRELEGY ELLIPTA) 100-62.5-25 MCG/INH AEPB Inhale 1 puff into the lungs daily.   Yes [provider]  hydrochlorothiazide (HYDRODIURIL) 25 MG tablet Take 25 mg by mouth every morning. 07/02/20  Yes [provider]  ipratropium-albuterol (DUONEB) 0.5-2.5 (3) MG/3ML SOLN Take 3 mLs by nebulization every 4 (four) hours as needed. Patient taking differently: Take 3 mLs by nebulization every 4 (four) hours as needed (shortness of breath/wheezing).  12/16/19  Yes Tat, Shanon Brow, MD  oxyCODONE (ROXICODONE) 15 MG immediate release tablet Take 15 mg by mouth 4 (four) times daily as needed for pain.  08/13/20  Yes [provider]  OXYGEN Inhale 3 L into the lungs continuous.    Yes [provider]  Physical Exam: Vitals:   08/29/20 1340 08/29/20 1500 08/29/20 1530 08/29/20 1618  BP: 125/81 131/80 129/71   Pulse: (!) 105 (!) 103 99   Resp: 13 18 (!) 25   Temp:      SpO2: 92% 96% 96% 95%    Constitutional: A mild distress secondary to shortness of breath  and tachypnea with exertion.  Having difficulty speaking in full sentences. Vitals:   08/29/20 1340 08/29/20 1500 08/29/20 1530 08/29/20 1618  BP: 125/81 131/80 129/71   Pulse: (!) 105 (!) 103 99   Resp: 13 18 (!) 25   Temp:      SpO2: 92% 96% 96% 95%   Eyes: PERRL, lids and conjunctivae normal ENMT: Mucous membranes are moist. Posterior pharynx clear of any exudate or lesions. Neck: normal, supple, no masses, no thyromegaly, no JVD. Respiratory: Poor air movement bilaterally, positive rhonchi and diffuse expiratory wheezing.  No using accessory muscle.  Positive appreciated tachypnea. Cardiovascular: Regular rate and rhythm, no murmurs / rubs / gallops. No extremity edema.  Abdomen: no tenderness, no masses palpated. No hepatosplenomegaly. Bowel sounds positive.  Musculoskeletal: no clubbing / cyanosis. No joint deformity upper and lower extremities. Good ROM, no contractures. Normal muscle tone.  Skin: no rashes, lesions, ulcers. No induration Neurologic: CN 2-12 grossly intact. Sensation intact, DTR normal. Strength 4/5 in all 4.  Psychiatric: Normal judgment and insight. Alert and oriented x 3. Normal mood.    Labs on Admission: I have personally reviewed following labs and imaging studies  CBC: Recent Labs  Lab 08/29/20 0929  WBC 6.3  NEUTROABS 3.6  HGB 15.5  HCT 48.9  MCV 94.2  PLT 419    Basic Metabolic Panel: Recent Labs  Lab 08/29/20 0929  NA 137  K 4.1  CL 97*  CO2 32  GLUCOSE 122*  BUN 8  CREATININE 0.80  CALCIUM 9.1    GFR: CrCl cannot be calculated (Miguel ideal weight.).  Liver Function Tests: No results for input(s): AST, ALT, ALKPHOS, BILITOT, PROT, ALBUMIN in the last 168 hours.  Urine analysis:    Component Value Date/Time   COLORURINE YELLOW 01/22/2020 1251   APPEARANCEUR CLEAR 01/22/2020 1251   LABSPEC 1.017 01/22/2020 1251   PHURINE 6.0 01/22/2020 1251   GLUCOSEU NEGATIVE 01/22/2020 1251   HGBUR NEGATIVE 01/22/2020 1251    BILIRUBINUR NEGATIVE 01/22/2020 1251   KETONESUR NEGATIVE 01/22/2020 1251   PROTEINUR NEGATIVE 01/22/2020 1251   UROBILINOGEN 0.2 10/21/2013 0200   NITRITE NEGATIVE 01/22/2020 1251   LEUKOCYTESUR NEGATIVE 01/22/2020 1251    Radiological Exams on Admission: DG Chest Port 1 View  Result Date: 08/29/2020 CLINICAL DATA:  Nonproductive cough for 3 days. EXAM: PORTABLE CHEST 1 VIEW COMPARISON:  04/12/2020 FINDINGS: Cardiac silhouette normal in size. Normal mediastinal and hilar contours. Lungs are hyperexpanded. There is interstitial prominence at the lung bases in addition to linear opacities, the latter finding consistent with atelectasis and/or scarring. No lung consolidation to suggest pneumonia and no evidence of pulmonary edema. No pleural effusion or pneumothorax. Skeletal structures are grossly intact. IMPRESSION: 1. No acute cardiopulmonary disease. 2. Lung hyperexpansion, mild interstitial thickening and mild lower lung zone linear atelectasis or scarring. Suspect COPD. Electronically Signed   By: Lajean Manes M.D.   On: 08/29/2020 09:52    EKG: Independently reviewed.  No acute ischemic changes appreciated.  Assessment/Plan 1-acute on chronic respiratory failure with hypoxia in the setting of COPD exacerbation and bronchiectasis -Patient chest x-ray demonstrating bronchitic changes; and patient describing increased productive  coughing spells along with shortness of breath. -Will admit in to MedSurg bed; start IV steroids, Pulmicort/Brovana nebulizer, as needed bronchodilators, mucolytic's/antitussive medications and oral doxycycline -Flutter valve/incentive respirometer-requested -Will follow clinical response and wean off oxygen supplementation to his baseline (3 L).  2-tobacco abuse -Cessation counseling has been provided -Nicotine patch has been ordered.  3-essential hypertension -Heart healthy diet has been ordered -Will follow vital signs -Determine the need for  antihypertensive agents.  4-hyperglycemia: Patient with history of prediabetes -Will check hemoglobin A1c -Modified carbohydrate diet and sliding scale insulin ordered -Anticipating elevated CBGs with the use of his steroids.  5-metastatic non-small cell lung cancer -Considered to be in remission -Continue patient follow-up with oncology service.  6-chronic pain and anxiety -Continue as needed anxiolytic and analgesics medications as per home regimen.  7-gastroesophageal reflux disease -Continue PPI.  DVT prophylaxis: Lovenox. Code Status:   Full code. Family Communication:  No family at bedside. Disposition Plan:   Patient is from:  Home  Anticipated DC to:  Home  Anticipated DC date:  08/31/20--09/01/20  Anticipated DC barriers: Stabilization of respiratory status. Consults called:  None. Admission status:  Inpatient, MedSurg, length of stay more than 2 midnights.  Severity of Illness: Moderate to severe illness; patient with acute on chronic respiratory failure with hypoxia in the setting of COPD exacerbation and bronchiectasis.  Chronically uses 3 L of oxygen supplementation and is requiring 4-5 L at this time to maintain O2 sat above 90%.  Based on patient's prior history and similar hospitalization I reexamined the requiring the use of BiPAP and developing hypercapnia without proper intervention.  Will admit for IV steroids, nebulizer management, antitussive medications, oral antibiotics and close monitoring of his oxygenation status.    Barton Dubois MD [facility]  How to contact the [facility] Attending or Consulting provider [place] or covering provider during after hours [place], for this patient?   1. Check the care team in [facility] and look for a) attending/consulting [facility] provider listed and b) the [facility] team listed 2. Log into [email or web address] and use [place]'s universal password to access. If you do not have the password, please contact the hospital operator. 3. Locate  the [facility] provider you are looking for under [facility] and page to a number that you can be directly reached. 4. If you still have difficulty reaching the provider, please page the [facility] (Director on Call) for the Hospitalists listed on amion for assistance.  08/29/2020, 4:30 PM

## 2020-08-29 NOTE — ED Triage Notes (Signed)
Pt reports SHOB x 3 days. Pt 89% on 3L [place]. Pt on 3L chronically. Denis pain.

## 2020-08-29 NOTE — Plan of Care (Signed)
  Problem: Clinical Measurements: Goal: Ability to maintain clinical measurements within normal limits will improve Outcome: Progressing   Problem: Activity: Goal: Risk for activity intolerance will decrease Outcome: Progressing   Problem: Safety: Goal: Ability to remain free from injury will improve Outcome: Progressing   

## 2020-08-29 NOTE — ED Provider Notes (Signed)
[facility] EMERGENCY DEPARTMENT Provider Note   CSN: [id number] Arrival date & time: 08/29/20  9937     History Chief Complaint  Patient presents with  . Shortness of Breath    Miguel Marsh is a 59 y.o. male.  Miguel Marsh is a 59 y.o. male with a history of COPD, chronic respiratory failure requiring 3 L nasal cannula, metastatic non-small cell lung cancer, hypertension, GERD, arthritis, who presents to the emergency department for evaluation of shortness of breath.  He states that he feels like his breathing has been getting worse over the last 3 days but this morning it seemed to get significantly worse prompting him to call the ambulance.  He has been wearing his 3 L of oxygen at all times as per usual, but reports he has been working harder to breathe.  He has been using his inhalers at home but does not feel like they have been helping.  He states this feels like his usual COPD exacerbations, and that he often has to be hospitalized with these.  Shortness of breath is present at rest and with exertion.  Denies orthopnea.  He denies any associated chest pain.  He states he has had an occasional nonproductive cough that he feels is at his baseline.  He denies any fevers or chills.  No recent sick contacts.  Has had his Covid vaccines.  Denies any lower extremity swelling or pain.  No lightheadedness or syncope. Patient also has stage IV non-small cell lung cancer which she is followed with [facility] oncology for, underwent robotic resection in 2019 and has been on Keytruda with good response and is currently on surveillance for his lung cancer.        Past Medical History:  Diagnosis Date  . Arthritis   . Bronchitis   . Cancer [facility])    metastatic NSCLC (followed by [facility])  . COPD (chronic obstructive pulmonary disease) ([place])   . HTN (hypertension)     Patient Active Problem List   Diagnosis Date Noted  . Goals of care, counseling/discussion   . Palliative care by  specialist   . DNR (do not resuscitate) discussion   . Hyperglycemia 04/12/2020  . Respiratory failure ([place]) 01/22/2020  . Acute respiratory failure ([place]) 01/23/2018  . Acute on chronic respiratory failure with hypoxia and hypercapnia ([place]) 01/23/2018  . Acute on chronic respiratory failure with hypoxia ([place]) 01/01/2018  . Obesity, Class III, BMI 40-49.9 (morbid obesity) ([place]) 01/01/2018  . COPD with exacerbation ([place]) 01/01/2018  . Essential hypertension 12/09/2015  . Non-small cell cancer of right lung (in remission) 12/09/2015  . GERD (gastroesophageal reflux disease) 12/09/2015  . COPD with acute exacerbation ([place]) 12/17/2014  . Tobacco use disorder 12/17/2014  . COPD exacerbation ([place]) 12/17/2014  . Obesity 12/17/2014  . Dyspnea   . Difficulty in walking(719.7) 04/02/2013  . Hip weakness 04/02/2013    Past Surgical History:  Procedure Laterality Date  . LUNG BIOPSY    . TOTAL HIP ARTHROPLASTY    . TUMOR REMOVAL Right 12/04/2017       No family history on file.  Social History   Tobacco Use  . Smoking status: Former Smoker    Packs/day: 0.10    Types: Cigarettes  . Smokeless tobacco: Never Used  Substance Use Topics  . Alcohol use: Yes    Comment: occ  . Drug use: No    Types: Marijuana, Cocaine    Comment: former    Home Medications Prior to  Admission medications   Medication Sig Start Date End Date Taking? Authorizing Provider  albuterol (PROVENTIL HFA;VENTOLIN HFA) 108 (90 Base) MCG/ACT inhaler Inhale 2 puffs into the lungs every 6 (six) hours as needed for wheezing. 07/17/18   Roxan Hockey, MD  albuterol (PROVENTIL) (2.5 MG/3ML) 0.083% nebulizer solution Inhale 2.5 mg into the lungs every 6 (six) hours as needed for wheezing or shortness of breath.  01/19/20   [provider]  Fluticasone-Umeclidin-Vilant (TRELEGY ELLIPTA) 100-62.5-25 MCG/INH AEPB Inhale 1 puff into the lungs daily.    [provider]  guaiFENesin (MUCINEX) 600 MG 12 hr  tablet Take 1 tablet (600 mg total) by mouth 2 (two) times daily. Patient taking differently: Take 600 mg by mouth 2 (two) times daily as needed for cough or to loosen phlegm.  07/17/18   Roxan Hockey, MD  hydrochlorothiazide (HYDRODIURIL) 12.5 MG tablet Take 1 tablet (12.5 mg total) by mouth daily. Patient taking differently: Take 12.5 mg by mouth daily as needed (for fluid).  07/17/18   Emokpae, Courage, MD  ipratropium-albuterol (DUONEB) 0.5-2.5 (3) MG/3ML SOLN Take 3 mLs by nebulization every 4 (four) hours as needed. Patient taking differently: Take 3 mLs by nebulization every 4 (four) hours as needed (shortness of breath/wheezing).  12/16/19   [name], MD  LORazepam (ATIVAN) 0.5 MG tablet Take 1 tablet (0.5 mg total) by mouth every 6 (six) hours as needed for anxiety. 04/16/20   [name], MD  Oxycodone HCl 10 MG TABS Take 10 mg by mouth every 6 (six) hours as needed (pain).  01/19/20   [provider]  OXYGEN Inhale 3 L into the lungs continuous.     [provider]  predniSONE (DELTASONE) 10 MG tablet Take 6 tablets (60 mg total) by mouth daily with breakfast. And decrease by one tablet daily 04/17/20   Tat, Shanon Brow, MD    Allergies    Patient has no known allergies.  Review of Systems   Review of Systems  Constitutional: Negative for chills and fever.  HENT: Negative.   Respiratory: Positive for cough and shortness of breath.   Cardiovascular: Negative for chest pain, palpitations and leg swelling.  Gastrointestinal: Negative for abdominal pain, nausea and vomiting.  Genitourinary: Negative for dysuria.  Musculoskeletal: Negative for arthralgias and myalgias.  Skin: Negative for color change and rash.  Neurological: Negative for dizziness, syncope and light-headedness.  All other systems reviewed and are negative.   Physical Exam Updated Vital Signs BP (!) 147/107   Pulse 93   Temp 98.5 F (36.9 C)   Resp (!) 33   SpO2 92%   Physical Exam Vitals and  nursing note reviewed.  Constitutional:      Appearance: He is well-developed. He is ill-appearing. He is not diaphoretic.     Comments: Patient is alert, ill-appearing and in some distress with increased work of breathing  HENT:     Head: Normocephalic and atraumatic.  Eyes:     General:        Right eye: No discharge.        Left eye: No discharge.     Pupils: Pupils are equal, round, and reactive to light.  Cardiovascular:     Rate and Rhythm: Normal rate and regular rhythm.     Pulses: Normal pulses.     Heart sounds: Normal heart sounds.  Pulmonary:     Effort: Tachypnea present.     Breath sounds: Decreased breath sounds and wheezing present. No rhonchi or rales.  Comments: Patient arrives tachypneic with increased respiratory effort on his home 3 L nasal cannula, initially satting at 89% on room air, improved up to the low 90s, he is able to speak in short sentences, on auscultation breath sounds are extremely diminished bilaterally patient sounds very tight with some faint expiratory wheezing noted, no rales or rhonchi. Chest:     Chest wall: No tenderness.  Abdominal:     General: Bowel sounds are normal. There is no distension.     Palpations: Abdomen is soft. There is no mass.     Tenderness: There is no abdominal tenderness. There is no guarding.     Comments: Abdomen soft, nondistended, nontender to palpation in all quadrants without guarding or peritoneal signs  Musculoskeletal:        General: No deformity.     Cervical back: Neck supple.     Right lower leg: No tenderness. No edema.     Left lower leg: No tenderness. No edema.  Skin:    General: Skin is warm and dry.     Capillary Refill: Capillary refill takes less than 2 seconds.  Neurological:     Mental Status: He is alert.     Coordination: Coordination normal.     Comments: Speech is clear, able to follow commands Moves extremities without ataxia, coordination intact  Psychiatric:        Mood and  Affect: Mood normal.        Behavior: Behavior normal.     ED Results / Procedures / Treatments   Labs (all labs ordered are listed, but only abnormal results are displayed) Labs Reviewed  BASIC METABOLIC PANEL - Abnormal; Notable for the following components:      Result Value   Chloride 97 (*)    Glucose, Bld 122 (*)    All other components within normal limits  RESPIRATORY PANEL BY RT PCR (FLU A&B, COVID)  CBC WITH DIFFERENTIAL/PLATELET  HEMOGLOBIN A1C    EKG EKG Interpretation  Date/Time:  Sunday August 29 2020 09:23:09 EDT Ventricular Rate:  92 PR Interval:    QRS Duration: 83 QT Interval:  330 QTC Calculation: 409 R Axis:   0 Text Interpretation: Sinus rhythm Markedly posterior QRS axis Low voltage, extremity leads Minimal ST depression, inferior leads No significant change since last tracing Confirmed by Isla Pence [id number]) on 08/29/2020 9:37:27 AM   Radiology DG Chest Port 1 View  Result Date: 08/29/2020 CLINICAL DATA:  Nonproductive cough for 3 days. EXAM: PORTABLE CHEST 1 VIEW COMPARISON:  04/12/2020 FINDINGS: Cardiac silhouette normal in size. Normal mediastinal and hilar contours. Lungs are hyperexpanded. There is interstitial prominence at the lung bases in addition to linear opacities, the latter finding consistent with atelectasis and/or scarring. No lung consolidation to suggest pneumonia and no evidence of pulmonary edema. No pleural effusion or pneumothorax. Skeletal structures are grossly intact. IMPRESSION: 1. No acute cardiopulmonary disease. 2. Lung hyperexpansion, mild interstitial thickening and mild lower lung zone linear atelectasis or scarring. Suspect COPD. Electronically Signed   By: Lajean Manes M.D.   On: 08/29/2020 09:52    Procedures .Critical Care Performed by: Jacqlyn Larsen, PA-C Authorized by: Jacqlyn Larsen, PA-C   Critical care provider statement:    Critical care time (minutes):  45   Critical care was necessary to treat or  prevent imminent or life-threatening deterioration of the following conditions:  Respiratory failure   Critical care was time spent personally by me on the following activities:  Discussions with  consultants, evaluation of patient's response to treatment, examination of patient, ordering and performing treatments and interventions, ordering and review of laboratory studies, ordering and review of radiographic studies, pulse oximetry, re-evaluation of patient's condition, obtaining history from patient or surrogate and review of old charts   (including critical care time)  Medications Ordered in ED Medications  magnesium sulfate IVPB 2 g 50 mL (has no administration in time range)  nicotine (NICODERM CQ - dosed in mg/24 hours) patch 14 mg (has no administration in time range)  enoxaparin (LOVENOX) injection 40 mg (has no administration in time range)  insulin aspart (novoLOG) injection 0-15 Units (has no administration in time range)  insulin aspart (novoLOG) injection 0-5 Units (has no administration in time range)  insulin detemir (LEVEMIR) injection 5 Units (has no administration in time range)  methylPREDNISolone sodium succinate (SOLU-MEDROL) 125 mg/2 mL injection 125 mg (125 mg Intravenous Given 08/29/20 0945)  albuterol (VENTOLIN HFA) 108 (90 Base) MCG/ACT inhaler 8 puff (8 puffs Inhalation Given 08/29/20 0945)  aerochamber plus with mask device 1 each (1 each Other Given 08/29/20 0948)  albuterol (PROVENTIL,VENTOLIN) solution continuous neb (10 mg/hr Nebulization Given 08/29/20 1151)  ipratropium (ATROVENT) nebulizer solution 0.5 mg (0.5 mg Nebulization Given 08/29/20 1151)    ED Course  I have reviewed the triage vital signs and the nursing notes.  Pertinent labs & imaging results that were available during my care of the patient were reviewed by me and considered in my medical decision making (see chart for details).    MDM Rules/Calculators/A&P                          59 year old male arrives with 3 days of shortness of breath, feels similar to previous COPD exacerbations, on chart review patient is here somewhat frequently with these and has to be admitted almost every time.  He is tachypneic with some increased respiratory effort on his home 3 L satting between 89-92%.  On exam his lungs are very tight with diminished breath sounds and some faint wheezing.  He has a chronic dry cough that has not become productive he denies any fevers, has had his Covid vaccine.  Will get basic labs, chest x-ray, EKG and Covid swab.  Will give 8 puffs of albuterol and Solu-Medrol.  Patient will likely need nebulizer treatments if Covid test is negative.  He has no associated chest pain, no history of CHF, no leg swelling, no rales on exam.  This seems most consistent with COPD exacerbation.  Low suspicion for PE or cardiac etiology.  I have independently ordered, reviewed and interpreted all labs and imaging:  EKG with sinus rhythm with no significant changes when compared to prior  Chest x-ray looks like COPD, no evidence of pneumonia or other acute cardiopulmonary disease, chronic lung hyperexpansion with mild interstitial thickening and atelectasis.  Labs show no leukocytosis, normal hemoglobin, no significant electrolyte derangements.  Covid and flu tests are negative.  Patient with no improvement after initial albuterol, now that Covid has returned negative will order 1 hour continuous nebulizer treatment.  Afterwards patient has slightly increased air movement but overall still sounds quite diminished with some wheezing, has had some sats of 87 to 88% on his home 3 L and was increased to 4-1/2 L.  Will require hospital admission.  Case discussed with Dr. Dyann Kief with [facility] hospitalist who will see and admit the patient.   Final Clinical Impression(s) / ED Diagnoses Final diagnoses:  COPD exacerbation [facility])    Rx / DC Orders ED Discharge Orders    None        Janet Berlin 08/29/20 1404    Isla Pence, MD 08/29/20 1441

## 2020-08-30 DIAGNOSIS — R739 Hyperglycemia, unspecified: Secondary | ICD-10-CM

## 2020-08-30 LAB — GLUCOSE, CAPILLARY
Glucose-Capillary: 170 mg/dL — ABNORMAL HIGH (ref 70–99)
Glucose-Capillary: 154 mg/dL — ABNORMAL HIGH (ref 70–99)
Glucose-Capillary: 144 mg/dL — ABNORMAL HIGH (ref 70–99)
Glucose-Capillary: 134 mg/dL — ABNORMAL HIGH (ref 70–99)

## 2020-08-30 LAB — HIV ANTIBODY (ROUTINE TESTING W REFLEX): HIV Screen 4th Generation wRfx: NONREACTIVE

## 2020-08-30 MED ORDER — PROSOURCE PLUS PO LIQD
30.0000 mL | Freq: Two times a day (BID) | ORAL | Status: DC
  Administered 2020-08-30 – 2020-09-02 (×6): 30 mL via ORAL
  Filled 2020-08-30 (×6): qty 30

## 2020-08-30 NOTE — Discharge Instructions (Signed)
Eating Plan for Chronic Obstructive Pulmonary Disease Chronic obstructive pulmonary disease (COPD) causes symptoms such as shortness of breath, coughing, and chest discomfort. These symptoms can make it difficult to eat enough to maintain a healthy weight. Generally, people with COPD should eat a diet that is high in calories, protein, and other nutrients to maintain body weight and to keep the lungs as healthy as possible. Depending on the medicines you take and other health conditions you may have, your health care provider may give you additional recommendations on what to eat or avoid. Talk with your health care provider about your goals for body weight, and work with a dietitian to develop an eating plan that is right for you. What are tips for following this plan? Reading food labels   Avoid foods with more than 300 milligrams (mg) of salt (sodium) per serving.  Choose foods that contain at least 4 grams (g) of fiber per serving. Try to eat 20-30 g of fiber each day.  Choose foods that are high in calories and protein, such as nuts, beans, yogurt, and cheese. Shopping  Do not buy foods labeled as diet, low-calorie, or low-fat.  If you are able to eat dairy products: ? Avoid low-fat or skim milk. ? Buy dairy products that have at least 2% fat.  Buy nutritional supplement drinks.  Buy grains and prepared foods labeled as enriched or fortified.  Consider buying low-sodium, pre-made foods to conserve energy for eating. Cooking  Add dry milk or protein powder to smoothies.  Cook with healthy fats, such as olive oil, canola oil, sunflower oil, and grapeseed oil.  Add oil, butter, cream cheese, or nut butters to foods to increase fat and calories.  To make foods easier to chew and swallow: ? Cook vegetables, pasta, and rice until soft. ? Cut or grind meat into very small pieces. ? Dip breads in liquid. Meal planning   Eat when you feel hungry.  Eat 5-6 small meals throughout  the day.  Drink 6-8 glasses of water each day.  Do not drink liquids with meals. Drink liquids at the end of the meal to avoid feeling full too quickly.  Eat a variety of fruits and vegetables every day.  Ask for assistance from family or friends with planning and preparing meals as needed.  Avoid foods that cause you to feel bloated, such as carbonated drinks, fried foods, beans, broccoli, cabbage, and apples.  For older adults, ask your local agency on aging whether you are eligible for meal assistance programs, such as Meals on Wheels. Lifestyle   Do not smoke.  Eat slowly. Take small bites and chew food well before swallowing.  Do not overeat. This may make it more difficult to breathe after eating.  Sit up while eating.  If needed, continue to use supplemental oxygen while eating.  Rest or relax for 30 minutes before and after eating.  Monitor your weight as told by your health care provider.  Exercise as told by your health care provider. What foods can I eat? Fruits All fresh, dried, canned, or frozen fruits that do not cause gas. Vegetables All fresh, canned (no salt added), or frozen vegetables that do not cause gas. Grains Whole grain bread. Enriched whole grain pasta. Fortified whole grain cereals. Fortified rice. Quinoa. Meats and other proteins Lean meat. Poultry. Fish. Dried beans. Unsalted nuts. Tofu. Eggs. Nut butters. Dairy Whole or 2% milk. Cheese. Yogurt. Fats and oils Olive oil. Canola oil. Butter. Margarine. Beverages Water. Vegetable   juice (no salt added). Decaffeinated coffee. Decaffeinated or herbal tea. Seasonings and condiments Fresh or dried herbs. Low-salt or salt-free seasonings. Low-sodium soy sauce. The items listed above may not be a complete list of foods and beverages you can eat. Contact a dietitian for more information. What foods are not recommended? Fruits Fruits that cause gas, such as apples or melon. Vegetables Vegetables  that cause gas, such as broccoli, Brussels sprouts, cabbage, cauliflower, and onions. Canned vegetables with added salt. Meats and other proteins Fried meat. Salt-cured meat. Processed meat. Dairy Fat-free or low-fat milk, yogurt, or cheese. Processed cheese. Beverages Carbonated drinks. Caffeinated drinks, such as coffee, tea, and soft drinks. Juice. Alcohol. Vegetable juice with added salt. Seasonings and condiments Salt. Seasoning mixes with salt. Soy sauce. Pickles. Other foods Clear soup or broth. Fried foods. Prepared frozen meals. The items listed above may not be a complete list of foods and beverages you should avoid. Contact a dietitian for more information. Summary  COPD symptoms can make it difficult to eat enough to maintain a healthy weight.  A COPD eating plan can help you maintain your body weight and keep your lungs as healthy as possible.  Eat a diet that is high in calories, protein, and other nutrients. Read labels to make sure that you are getting the right nutrients. Cook foods to make them easy to chew and swallow.  Eat 5-6 small meals throughout the day, and avoid foods that cause gas or make you feel bloated. This information is not intended to replace advice given to you by your health care provider. Make sure you discuss any questions you have with your health care provider. Document Revised: 02/06/2019 Document Reviewed: 01/01/2018 [employer] Patient Education  2020 [employer]  

## 2020-08-30 NOTE — Evaluation (Signed)
Physical Therapy Evaluation Patient Details Name: Miguel Marsh MRN: [id number] DOB: [birth year] Today's Date: 08/30/2020   History of Present Illness  Miguel Marsh is a 59 y.o. male with medical history significant of chronic bronchitis, metastatic non-small cell lung cancer (followed [facility] and currently in remission), hypertension, chronic pain syndrome, anxiety and COPD with chronic respiratory failure (3 L oxygen supplementation at baseline); who presented to the hospital secondary to worsening his shortness of breath.  He reports that for the last 4 days he has been experiencing difficulty breathing and the symptom has continued worsening.    Clinical Impression  Patient functioning at baseline for functional mobility and gait.  Plan:  Patient discharged from physical therapy to care of nursing for ambulation daily as tolerated for length of stay.     Follow Up Recommendations No PT follow up    Equipment Recommendations  None recommended by PT    Recommendations for Other Services       Precautions / Restrictions Precautions Precautions: None Restrictions Weight Bearing Restrictions: No      Mobility  Bed Mobility Overal bed mobility: Independent                  Transfers Overall transfer level: Independent Equipment used: None             General transfer comment: see PT evaluation  Ambulation/Gait Ambulation/Gait assistance: Modified independent (Device/Increase time) Gait Distance (Feet): 75 Feet Assistive device: None Gait Pattern/deviations: Decreased step length - right;Decreased step length - left;Decreased stride length Gait velocity: decreased   General Gait Details: slightly labored cadence without loss of balance, on 3 LPM O2 with SpO2 at 97-100%  Stairs            Wheelchair Mobility    Modified Rankin (Stroke Patients Only)       Balance Overall balance assessment: No apparent balance deficits (not formally  assessed)                                           Pertinent Vitals/Pain Pain Assessment: 0-10 Pain Score: 3  Pain Location: right side of ribs.chronic from previous surgical procedure for tumor removal. Pain Descriptors / Indicators: Aching Pain Intervention(s): Limited activity within patient's tolerance;Monitored during session    [place] expects to be discharged to:: Private residence Living Arrangements: Alone Available Help at Discharge: Friend(s);Available PRN/intermittently Type of Home: Apartment Home Access: Elevator     Home Layout: One level Home Equipment: Cane - single point;Walker - 4 wheels;Grab bars - tub/shower      Prior Function Level of Independence: Needs assistance   Gait / Transfers Assistance Needed: household ambulator without AD  ADL's / Homemaking Assistance Needed: Has an Aide that assists for 3 hours every day.  Comments: Pt reports that his friends assist him with driving as he does not drive. He utilizes the store scooter when grocery shopping.     Hand Dominance   Dominant Hand: Right    Extremity/Trunk Assessment   Upper Extremity Assessment Upper Extremity Assessment: Defer to OT evaluation    Lower Extremity Assessment Lower Extremity Assessment: Overall WFL for tasks assessed    Cervical / Trunk Assessment Cervical / Trunk Assessment: Normal  Communication   Communication: No difficulties  Cognition Arousal/Alertness: Awake/alert Behavior During Therapy: WFL for tasks assessed/performed Overall Cognitive Status: Within Functional Limits for tasks  assessed                                        General Comments      Exercises     Assessment/Plan    PT Assessment Patent does not need any further PT services  PT Problem List         PT Treatment Interventions      PT Goals (Current goals can be found in the Care Plan section)  Acute Rehab PT Goals Patient  Stated Goal: return home with family and home aides to assist PT Goal Formulation: With patient Time For Goal Achievement: 08/30/20 Potential to Achieve Goals: Good    Frequency     Barriers to discharge        Co-evaluation PT/OT/SLP Co-Evaluation/Treatment: Yes Reason for Co-Treatment: Complexity of the patient's impairments (multi-system involvement) PT goals addressed during session: Mobility/safety with mobility;Balance OT goals addressed during session: ADL's and self-care;Proper use of Adaptive equipment and DME;Strengthening/ROM       AM-PAC PT "6 Clicks" Mobility  Outcome Measure Help needed turning from your back to your side while in a flat bed without using bedrails?: None Help needed moving from lying on your back to sitting on the side of a flat bed without using bedrails?: None Help needed moving to and from a bed to a chair (including a wheelchair)?: None Help needed standing up from a chair using your arms (e.g., wheelchair or bedside chair)?: None Help needed to walk in hospital room?: None Help needed climbing 3-5 steps with a railing? : A Little 6 Click Score: 23    End of Session Equipment Utilized During Treatment: Oxygen Activity Tolerance: Patient tolerated treatment well;Patient limited by fatigue Patient left: in chair;with call bell/phone within reach Nurse Communication: Mobility status PT Visit Diagnosis: Unsteadiness on feet (R26.81);Other abnormalities of gait and mobility (R26.89);Muscle weakness (generalized) (M62.81)    Time: 2992-4268 PT Time Calculation (min) (ACUTE ONLY): 16 min   Charges:   PT Evaluation $PT Eval Low Complexity: 1 Low PT Treatments $Gait Training: 8-22 mins        11:57 AM, 08/30/20 Lonell Grandchild, MPT Physical Therapist with [facility] [phone] office [phone] mobile phone

## 2020-08-30 NOTE — Plan of Care (Signed)

## 2020-08-30 NOTE — Evaluation (Signed)
Occupational Therapy Evaluation Patient Details Name: Miguel Marsh MRN: [id number] DOB: July 17, 1961 Today's Date: 08/30/2020    History of Present Illness Miguel Marsh is a 59 y.o. male with medical history significant of chronic bronchitis, metastatic non-small cell lung cancer (followed [facility] and currently in remission), hypertension, chronic pain syndrome, anxiety and COPD with chronic respiratory failure (3 L oxygen supplementation at baseline); who presented to the hospital secondary to worsening his shortness of breath.  He reports that for the last 4 days he has been experiencing difficulty breathing and the symptom has continued worsening.   Clinical Impression   Pt demonstrated good safety awareness and awareness of deficits related to COPD. Able to take rest breaks and verbalize limitations appropriately. Patient is able to complete all ADL tasks at his baseline of Modified independence. He reports having an aide assist him as needed for 3 hours every day. No concerns regarding returning home. Recommended purchasing a shower seat for bathing to allow him to conserve his energy while bathing and increase his safety in the shower. No follow up OT needs at this time.     Follow Up Recommendations  No OT follow up    Equipment Recommendations  Tub/shower seat       Precautions / Restrictions Precautions Precautions: None Restrictions Weight Bearing Restrictions: No      Mobility Bed Mobility Overal bed mobility: Independent     Transfers Overall transfer level: Independent Equipment used: None     General transfer comment: see PT evaluation    Balance Overall balance assessment: No apparent balance deficits (not formally assessed)       ADL either performed or assessed with clinical judgement   ADL Overall ADL's : Modified independent;At baseline     General ADL Comments: required increased time for all ADL completion due to breathing and exertion.  Able to demonstrate awareness of deficits and when to take rest breaks.     Vision Baseline Vision/History: Wears glasses Wears Glasses: Reading only Patient Visual Report: No change from baseline              Pertinent Vitals/Pain Pain Assessment: 0-10 Pain Score: 3  Pain Location: right side of ribs.chronic from previous surgical procedure for tumor removal. Pain Descriptors / Indicators: Aching Pain Intervention(s): Monitored during session     Hand Dominance Right   Extremity/Trunk Assessment Upper Extremity Assessment Upper Extremity Assessment: Overall WFL for tasks assessed   Lower Extremity Assessment Lower Extremity Assessment: Defer to PT evaluation       Communication Communication Communication: No difficulties   Cognition Arousal/Alertness: Awake/alert Behavior During Therapy: WFL for tasks assessed/performed Overall Cognitive Status: Within Functional Limits for tasks assessed                  Home Living Family/patient expects to be discharged to:: Private residence Living Arrangements: Alone Available Help at Discharge: Friend(s);Available PRN/intermittently Type of Home: Apartment Home Access: Elevator     Home Layout: One level     Bathroom Shower/Tub: Teacher, early years/pre: Standard     Home Equipment: Cane - single point;Walker - 4 wheels;Grab bars - tub/shower          Prior Functioning/Environment Level of Independence: Needs assistance    ADL's / Homemaking Assistance Needed: Has an Aide that assists for 3 hours every day.   Comments: Pt reports that his friends assist him with driving as he does not drive. He utilizes the store scooter when grocery  shopping.        OT Problem List: Decreased activity tolerance                       Co-evaluation PT/OT/SLP Co-Evaluation/Treatment: Yes Reason for Co-Treatment: To address functional/ADL transfers   OT goals addressed during session: ADL's and  self-care;Proper use of Adaptive equipment and DME;Strengthening/ROM      AM-PAC OT "6 Clicks" Daily Activity     Outcome Measure Help from another person eating meals?: None Help from another person taking care of personal grooming?: None Help from another person toileting, which includes using toliet, bedpan, or urinal?: None Help from another person bathing (including washing, rinsing, drying)?: None Help from another person to put on and taking off regular upper body clothing?: None Help from another person to put on and taking off regular lower body clothing?: None 6 Click Score: 24   End of Session Equipment Utilized During Treatment: Oxygen  Activity Tolerance: Patient tolerated treatment well Patient left: in chair;with call bell/phone within reach                   Time: 0855-0910 OT Time Calculation (min): 15 min Charges:  OT General Charges $OT Visit: 1 Visit OT Evaluation $OT Eval Low Complexity: 1 Low  Ailene Ravel, OTR/L,CBIS  [phone]   Azrael Huss, Clarene Duke 08/30/2020, 10:37 AM

## 2020-08-30 NOTE — Progress Notes (Addendum)
Initial Nutrition Assessment  DOCUMENTATION CODES:   Obesity unspecified  INTERVENTION:  Education- Obesity   ProSource Plus BID   Agree with Heart Healthy / CHO modified diet   NUTRITION DIAGNOSIS:   Increased nutrient needs related to catabolic illness (COPD) as evidenced by estimated needs.  GOAL:  Patient will meet greater than or equal to 90% of their needs   MONITOR:  PO intake, Labs, Weight trends    REASON FOR ASSESSMENT:   Consult Assessment of nutrition requirement/status  ASSESSMENT: Patient is a 59 yo male with hx of COPD (chronic bronchitis) O2 @ 3 liters, HTN, Cancer (NSCLC). He presents with shortness of breath.  Patient reports po at 100% of meals. Independent with feeding and meal preparation. He usually eats main meal around lunch time and has sandwich for breakfast and snacks through the day as desired. Pepsi to drink and regular diet at home. Patient denies change in appetite prior to admission.  Weight loss considered beneficial. Eliminate sugary beverages and portion control advised. Attached handout with discharge instructions.    Patient thinks he has lost a few pounds. Reports usual around 235-240 lb. Hospital records 100.3 kg (220.6 lb) currently.    Medications reviewed and include: Novolog, Levimir, Doxycycline.    Labs: BMP Latest Ref Rng & Units 08/29/2020 04/16/2020 04/15/2020  Glucose 70 - 99 mg/dL 122(H) 267(H) 135(H)  BUN 6 - 20 mg/dL 8 14 13   Creatinine 0.61 - 1.24 mg/dL 0.80 0.85 0.72  Sodium 135 - 145 mmol/L 137 139 139  Potassium 3.5 - 5.1 mmol/L 4.1 4.6 4.5  Chloride 98 - 111 mmol/L 97(L) 95(L) 98  CO2 22 - 32 mmol/L 32 36(H) 37(H)  Calcium 8.9 - 10.3 mg/dL 9.1 8.9 8.8(L)     NUTRITION - FOCUSED PHYSICAL EXAM:    Most Recent Value  Orbital Region No depletion  Upper Arm Region No depletion  Thoracic and Lumbar Region No depletion  Buccal Region No depletion  Temple Region No depletion  Clavicle Bone Region No depletion   Clavicle and Acromion Bone Region No depletion  Scapular Bone Region No depletion  Dorsal Hand No depletion  Edema (RD Assessment) None  Hair Reviewed  Eyes Reviewed  Mouth Reviewed  Skin Reviewed  Nails Reviewed     Diet Order:   Diet Order            Diet heart healthy/carb modified Room service appropriate? Yes; Fluid consistency: Thin  Diet effective now                 EDUCATION NEEDS:  Education needs have been addressed Skin:  Skin Assessment: Reviewed RN Assessment  Last BM:  10/30  Height:   Ht Readings from Last 1 Encounters:  08/29/20 5\' 6"  (1.676 m)    Weight:   Wt Readings from Last 1 Encounters:  08/29/20 100.3 kg    Ideal Body Weight:   65 kg   BMI:  Body mass index is 35.69 kg/m.  Estimated Nutritional Needs:   Kcal:  2100-2275 (Adj BW)  Protein:  119-133 gr  Fluid:  > 2liters daily  Colman Cater MS,RD,CSG,LDN Pager: Shea Evans

## 2020-08-30 NOTE — Progress Notes (Signed)
PROGRESS NOTE    Miguel MATSUO  TMA:[id number] DOB: 02-May-1961 DOA: 08/29/2020 PCP: Adaline Sill, NP   Chief Complaint  Patient presents with  . Shortness of Breath    Brief Narrative:  Miguel Marsh is a 59 y.o. male with medical history significant of chronic bronchitis, metastatic non-small cell lung cancer (followed [facility] and currently in remission), hypertension, chronic pain syndrome, anxiety and COPD with chronic respiratory failure (3 L oxygen supplementation at baseline); who presented to the hospital secondary to worsening his shortness of breath.  He reports that for the last 4 days he has been experiencing difficulty breathing and the symptom has continued worsening.  He expressed associated intermittent productive coughing spells and no improvement in his symptoms by the use of inhalers/nebulizer at home.  No fever, no chills, no nausea, no vomiting, no focal weakness, no abdominal pain, no dysuria, no hematuria, no melena, no hematochezia or any other complaints.  Of note, patient continues to smoke 1 to 2 cigarettes/day and expressed to be vaccinated against Covid.  Respiratory panel negative for influenza and COVID-19.  ED Course: Chest x-ray without frank infiltrates, poor air movement appreciated on physical examination and increased wheezing.  Patient requiring higher level of oxygen supplementation than his baseline.  TRH has been called to me patient for further evaluation and management of acute on chronic respiratory failure with hypoxia due to COPD exacerbation.  Assessment & Plan: 1-acute on chronic respiratory failure with hypoxia -On presentation patient required up to 5 L of oxygen supplementation to keep O2 sat above 90% -Some improvement appreciated we have been able to wean him down to 3-3.5 L nasal cannula (he uses 3 L at baseline). -Continue to have difficulty speaking in full sentences, poor air movement appreciated on auscultation and  very short winded/short of breath with activity. -Will continue IV steroids, nebulizer management, oral antibiotics, antitussive medications and the use of flutter valve/incentive spirometry. -Continue supportive care and follow clinical response.  2-tobacco abuse -Extensive cessation counseling has been provided -Continue the use of nicotine patch.  3-essential hypertension -Blood pressure has remained stable -Will continue monitoring vital signs -Heart healthy diet has been encouraged. -No using antihypertensive agents prior to admission.  4-hyperglycemia with prior history of prediabetes -A1c 6.0 -Elevated CBGs most likely associated with the use of a steroid -Will continue the use of sliding scale insulin.  5-gastroesophageal flux disease -Continue PPI  6-chronic pain and anxiety -Continue as needed anxiolytic and analgesic medication as per home regimen -Mood is a stable and expressed overall pain is well controlled.  7-metastatic non-small cell lung cancer -Continue outpatient follow-up with oncology service -Per records appears to be in remission.   DVT prophylaxis: Lovenox. Code Status: Full code. Family Communication: No family at bedside. Disposition:   Status is: Inpatient  Dispo: The patient is from: Home              Anticipated d/c is to: Home              Anticipated d/c date is: 09/01/2020              Patient currently no medically ready for discharge; still having difficulty speaking in full sentences, with poor air movement appreciated on examination, short of breath and very short winded with activity; will continue IV steroids, nebulizer management, oral antibiotics, antitussive medications and supportive care.    Consultants:   None   Procedures:  See below for x-ray reports.   Antimicrobials:  Doxycycline   Subjective: Some improvement in his oxygen saturation and currently down to 3-3.5 L nasal cannula supplementation.  Still having  difficulty speaking in full sentences and very short winded while engaging into activity.  Patient reports no nausea, no vomiting, no dysuria, no hematuria, no chest pain currently.  Objective: Vitals:   08/30/20 0436 08/30/20 0749 08/30/20 1056 08/30/20 1537  BP: (!) 142/77     Pulse: 85     Resp: 16     Temp: 98.4 F (36.9 C)     TempSrc: Oral     SpO2: 95% 93% 93% 95%  Weight:      Height:        Intake/Output Summary (Last 24 hours) at 08/30/2020 1557 Last data filed at 08/30/2020 0300 Gross per 24 hour  Intake 968.57 ml  Output --  Net 968.57 ml   Filed Weights   08/29/20 2036  Weight: 100.3 kg    Examination:  General exam: Afebrile, having difficulty speaking in full sentences and very short winded while ambulating.  Oxygen saturation has been able to come down to 3-3.5 L through nasal cannula.  Patient expressed to be so short of breath by just walking from the bed to the bathroom. Respiratory system: Poor air movement bilaterally; pulse respiratory wheezing.  Positive rhonchi.  No using accessory muscles while resting. Cardiovascular system: S1 & S2 heard, RRR. No JVD, murmurs, rubs, gallops or clicks. No pedal edema. Gastrointestinal system: Abdomen is nondistended, soft and nontender. No organomegaly or masses felt. Normal bowel sounds heard. Central nervous system: Alert and oriented. No focal neurological deficits. Extremities: No cyanosis or clubbing. Skin: No petechiae. Psychiatry: Judgement and insight appear normal. Mood & affect appropriate.     Data Reviewed: I have personally reviewed following labs and imaging studies  CBC: Recent Labs  Lab 08/29/20 0929  WBC 6.3  NEUTROABS 3.6  HGB 15.5  HCT 48.9  MCV 94.2  PLT 315    Basic Metabolic Panel: Recent Labs  Lab 08/29/20 0929  NA 137  K 4.1  CL 97*  CO2 32  GLUCOSE 122*  BUN 8  CREATININE 0.80  CALCIUM 9.1    GFR: Estimated Creatinine Clearance: 110.3 mL/min (by C-G formula based on  SCr of 0.8 mg/dL).  CBG: Recent Labs  Lab 08/29/20 1746 08/29/20 2055 08/30/20 0816 08/30/20 1153  GLUCAP 206* 214* 170* 154*     Recent Results (from the past 240 hour(s))  Respiratory Panel by RT PCR (Flu A&B, Covid) - Nasopharyngeal Swab     Status: None   Collection Time: 08/29/20  9:30 AM   Specimen: Nasopharyngeal Swab  Result Value Ref Range Status   SARS Coronavirus 2 by RT PCR NEGATIVE NEGATIVE Final    Comment: (NOTE) SARS-CoV-2 target nucleic acids are NOT DETECTED.  The SARS-CoV-2 RNA is generally detectable in upper respiratoy specimens during the acute phase of infection. The lowest concentration of SARS-CoV-2 viral copies this assay can detect is 131 copies/mL. A negative result does not preclude SARS-Cov-2 infection and should not be used as the sole basis for treatment or other patient management decisions. A negative result may occur with  improper specimen collection/handling, submission of specimen other than nasopharyngeal swab, presence of viral mutation(s) within the areas targeted by this assay, and inadequate number of viral copies (<131 copies/mL). A negative result must be combined with clinical observations, patient history, and epidemiological information. The expected result is Negative.  Fact Sheet for Patients:  PinkCheek.be  Fact  Sheet for Healthcare Providers:  GravelBags.it  This test is no t yet approved or cleared by the [place] FDA and  has been authorized for detection and/or diagnosis of SARS-CoV-2 by FDA under an Emergency Use Authorization (EUA). This EUA will remain  in effect (meaning this test can be used) for the duration of the COVID-19 declaration under Section 564(b)(1) of the Act, 21 U.S.C. section 360bbb-3(b)(1), unless the authorization is terminated or revoked sooner.     Influenza A by PCR NEGATIVE NEGATIVE Final   Influenza B by PCR NEGATIVE NEGATIVE  Final    Comment: (NOTE) The Xpert Xpress SARS-CoV-2/FLU/RSV assay is intended as an aid in  the diagnosis of influenza from Nasopharyngeal swab specimens and  should not be used as a sole basis for treatment. Nasal washings and  aspirates are unacceptable for Xpert Xpress SARS-CoV-2/FLU/RSV  testing.  Fact Sheet for Patients: PinkCheek.be  Fact Sheet for Healthcare Providers: GravelBags.it  This test is not yet approved or cleared by the [place] FDA and  has been authorized for detection and/or diagnosis of SARS-CoV-2 by  FDA under an Emergency Use Authorization (EUA). This EUA will remain  in effect (meaning this test can be used) for the duration of the  Covid-19 declaration under Section 564(b)(1) of the Act, 21  U.S.C. section 360bbb-3(b)(1), unless the authorization is  terminated or revoked. Performed at [facility], [address], [place], [place] [address]      Radiology Studies: [facility] 1 View  Result Date: 08/29/2020 CLINICAL DATA:  Nonproductive cough for 3 days. EXAM: PORTABLE CHEST 1 VIEW COMPARISON:  04/12/2020 FINDINGS: Cardiac silhouette normal in size. Normal mediastinal and hilar contours. Lungs are hyperexpanded. There is interstitial prominence at the lung bases in addition to linear opacities, the latter finding consistent with atelectasis and/or scarring. No lung consolidation to suggest pneumonia and no evidence of pulmonary edema. No pleural effusion or pneumothorax. Skeletal structures are grossly intact. IMPRESSION: 1. No acute cardiopulmonary disease. 2. Lung hyperexpansion, mild interstitial thickening and mild lower lung zone linear atelectasis or scarring. Suspect COPD. Electronically Signed   By: Lajean Manes M.D.   On: 08/29/2020 09:52    Scheduled Meds: . (feeding supplement) PROSource Plus  30 mL Oral BID BM  . arformoterol  15 mcg Nebulization BID  . budesonide (PULMICORT)  nebulizer solution  0.5 mg Nebulization BID  . dextromethorphan-guaiFENesin  1 tablet Oral BID  . doxycycline  100 mg Oral Q12H  . enoxaparin (LOVENOX) injection  40 mg Subcutaneous Q24H  . insulin aspart  0-15 Units Subcutaneous TID WC  . insulin aspart  0-5 Units Subcutaneous QHS  . insulin detemir  5 Units Subcutaneous QHS  . ipratropium-albuterol  3 mL Nebulization QID  . nicotine  14 mg Transdermal Daily   Continuous Infusions:   LOS: 1 day    Time spent: 30 minutes    Barton Dubois, MD [facility]   To contact the attending provider between 7A-7P or the covering provider during after hours 7P-7A, please log into the web site [email or web address] and access using universal [place] password for that web site. If you do not have the password, please call the hospital operator.  08/30/2020, 3:57 PM

## 2020-08-31 LAB — GLUCOSE, CAPILLARY
Glucose-Capillary: 85 mg/dL (ref 70–99)
Glucose-Capillary: 89 mg/dL (ref 70–99)
Glucose-Capillary: 153 mg/dL — ABNORMAL HIGH (ref 70–99)
Glucose-Capillary: 174 mg/dL — ABNORMAL HIGH (ref 70–99)

## 2020-08-31 MED ORDER — METHYLPREDNISOLONE SODIUM SUCC 125 MG IJ SOLR
60.0000 mg | Freq: Two times a day (BID) | INTRAMUSCULAR | Status: DC
  Administered 2020-08-31 – 2020-09-02 (×4): 60 mg via INTRAVENOUS
  Filled 2020-08-31 (×4): qty 2

## 2020-08-31 NOTE — Progress Notes (Signed)
PROGRESS NOTE    Miguel Marsh  URK:[id number] DOB: [birth year] DOA: 08/29/2020 PCP: Adaline Sill, NP   Chief Complaint  Patient presents with   Shortness of Breath    Brief Narrative:  Miguel Marsh is a 59 y.o. male with medical history significant of chronic bronchitis, metastatic non-small cell lung cancer (followed [facility] and currently in remission), hypertension, chronic pain syndrome, anxiety and COPD with chronic respiratory failure (3 L oxygen supplementation at baseline); who presented to the hospital secondary to worsening his shortness of breath.  He reports that for the last 4 days he has been experiencing difficulty breathing and the symptom has continued worsening.  He expressed associated intermittent productive coughing spells and no improvement in his symptoms by the use of inhalers/nebulizer at home.  No fever, no chills, no nausea, no vomiting, no focal weakness, no abdominal pain, no dysuria, no hematuria, no melena, no hematochezia or any other complaints.  Of note, patient continues to smoke 1 to 2 cigarettes/day and expressed to be vaccinated against Covid.  Respiratory panel negative for influenza and COVID-19.  ED Course: Chest x-ray without frank infiltrates, poor air movement appreciated on physical examination and increased wheezing.  Patient requiring higher level of oxygen supplementation than his baseline.  TRH has been called to me patient for further evaluation and management of acute on chronic respiratory failure with hypoxia due to COPD exacerbation.  Assessment & Plan: 1-acute on chronic respiratory failure with hypoxia -On presentation patient required up to 5 L of oxygen supplementation to keep O2 sat above 90% -Some improvement appreciated we have been able to wean him down to 3-3.5 L nasal cannula (he uses 3 L at baseline). -Continue to have difficulty speaking in full sentences, poor air movement appreciated on auscultation and  very short winded/short of breath with activity. -Will continue IV steroids, nebulizer management, oral antibiotics, antitussive medications and the use of flutter valve/incentive spirometry. -Continue supportive care and follow clinical response.  2-tobacco abuse -Extensive cessation counseling has been provided -Continue the use of nicotine patch.  3-essential hypertension -Blood pressure has remained stable -Will continue monitoring vital signs -Heart healthy diet has been encouraged. -No using antihypertensive agents prior to admission.  4-hyperglycemia with prior history of prediabetes -A1c 6.0 -Elevated CBGs most likely associated with the use of a steroid -Will continue the use of sliding scale insulin.  5-gastroesophageal flux disease -Continue PPI  6-chronic pain and anxiety -Continue as needed anxiolytic and analgesic medication as per home regimen -Mood is a stable and expressed overall pain is well controlled.  7-metastatic non-small cell lung cancer -Continue outpatient follow-up with oncology service -Per records appears to be in remission.   DVT prophylaxis: Lovenox. Code Status: Full code. Family Communication: No family at bedside. Disposition:   Status is: Inpatient  Dispo: The patient is from: Home              Anticipated d/c is to: Home              Anticipated d/c date is: 09/01/2020              Patient currently no medically ready for discharge; still having difficulty speaking in full sentences, with poor air movement appreciated on examination, short of breath and very short winded with activity; will continue IV steroids, nebulizer management, oral antibiotics, antitussive medications and supportive care.    Consultants:   None   Procedures:  See below for x-ray reports.   Antimicrobials:  Doxycycline   Subjective: No fever, no nausea, no vomiting.  3 L nasal cannula supplementation in place.  Continues to have difficulty speaking in  full sentences, short winded with activity/exertion.  Patient expressed not feeling safe going home at this moment due to ongoing difficulty breathing.  Objective: Vitals:   08/31/20 1123 08/31/20 1349 08/31/20 1443 08/31/20 1501  BP:  123/81  122/71  Pulse:  87  94  Resp:  18  20  Temp:  98 F (36.7 C)  98.7 F (37.1 C)  TempSrc:  Oral  Oral  SpO2: 96% 96% 96% 98%  Weight:      Height:        Intake/Output Summary (Last 24 hours) at 08/31/2020 1647 Last data filed at 08/30/2020 1900 Gross per 24 hour  Intake 240 ml  Output --  Net 240 ml   Filed Weights   08/29/20 2036  Weight: 100.3 kg    Examination: General exam: Alert, awake, oriented x 3; no fever, no chest pain, no nausea or vomiting.  Continues to have difficulty speaking in full sentences and expressed feeling short winded with activity.  He does not feel ready or safe to go home at this moment.  Oxygen supplementation 3 L nasal cannula.  Expressed intermittent nonproductive coughing spells. Respiratory system: Poor air movement bilaterally; positive rhonchi.  No using accessory muscles.  Shallow breath/tachypnea while attempting to speak in full sentences. Cardiovascular system:RRR. No murmurs, rubs, gallops.  No JVD. Gastrointestinal system: Abdomen is nondistended, soft and nontender. No organomegaly or masses felt. Normal bowel sounds heard. Central nervous system: Alert and oriented. No focal neurological deficits. Extremities: No cyanosis or clubbing. Skin: No petechiae. Psychiatry: Judgement and insight appear normal. Mood & affect appropriate.    Data Reviewed: I have personally reviewed following labs and imaging studies  CBC: Recent Labs  Lab 08/29/20 0929  WBC 6.3  NEUTROABS 3.6  HGB 15.5  HCT 48.9  MCV 94.2  PLT 902    Basic Metabolic Panel: Recent Labs  Lab 08/29/20 0929  NA 137  K 4.1  CL 97*  CO2 32  GLUCOSE 122*  BUN 8  CREATININE 0.80  CALCIUM 9.1    GFR: Estimated  Creatinine Clearance: 110.3 mL/min (by C-G formula based on SCr of 0.8 mg/dL).  CBG: Recent Labs  Lab 08/30/20 1153 08/30/20 1646 08/30/20 2115 08/31/20 0747 08/31/20 1133  GLUCAP 154* 144* 134* 85 89     Recent Results (from the past 240 hour(s))  Respiratory Panel by RT PCR (Flu A&B, Covid) - Nasopharyngeal Swab     Status: None   Collection Time: 08/29/20  9:30 AM   Specimen: Nasopharyngeal Swab  Result Value Ref Range Status   SARS Coronavirus 2 by RT PCR NEGATIVE NEGATIVE Final    Comment: (NOTE) SARS-CoV-2 target nucleic acids are NOT DETECTED.  The SARS-CoV-2 RNA is generally detectable in upper respiratoy specimens during the acute phase of infection. The lowest concentration of SARS-CoV-2 viral copies this assay can detect is 131 copies/mL. A negative result does not preclude SARS-Cov-2 infection and should not be used as the sole basis for treatment or other patient management decisions. A negative result may occur with  improper specimen collection/handling, submission of specimen other than nasopharyngeal swab, presence of viral mutation(s) within the areas targeted by this assay, and inadequate number of viral copies (<131 copies/mL). A negative result must be combined with clinical observations, patient history, and epidemiological information. The expected result is Negative.  Fact  Sheet for Patients:  PinkCheek.be  Fact Sheet for Healthcare Providers:  GravelBags.it  This test is no t yet approved or cleared by the [place] FDA and  has been authorized for detection and/or diagnosis of SARS-CoV-2 by FDA under an Emergency Use Authorization (EUA). This EUA will remain  in effect (meaning this test can be used) for the duration of the COVID-19 declaration under Section 564(b)(1) of the Act, 21 U.S.C. section 360bbb-3(b)(1), unless the authorization is terminated or revoked sooner.      Influenza A by PCR NEGATIVE NEGATIVE Final   Influenza B by PCR NEGATIVE NEGATIVE Final    Comment: (NOTE) The Xpert Xpress SARS-CoV-2/FLU/RSV assay is intended as an aid in  the diagnosis of influenza from Nasopharyngeal swab specimens and  should not be used as a sole basis for treatment. Nasal washings and  aspirates are unacceptable for Xpert Xpress SARS-CoV-2/FLU/RSV  testing.  Fact Sheet for Patients: PinkCheek.be  Fact Sheet for Healthcare Providers: GravelBags.it  This test is not yet approved or cleared by the [place] FDA and  has been authorized for detection and/or diagnosis of SARS-CoV-2 by  FDA under an Emergency Use Authorization (EUA). This EUA will remain  in effect (meaning this test can be used) for the duration of the  Covid-19 declaration under Section 564(b)(1) of the Act, 21  U.S.C. section 360bbb-3(b)(1), unless the authorization is  terminated or revoked. Performed at [facility], [address] [place], [place] [address]      Radiology Studies: No results found.  Scheduled Meds:  (feeding supplement) PROSource Plus  30 mL Oral BID BM   arformoterol  15 mcg Nebulization BID   budesonide (PULMICORT) nebulizer solution  0.5 mg Nebulization BID   dextromethorphan-guaiFENesin  1 tablet Oral BID   doxycycline  100 mg Oral Q12H   enoxaparin (LOVENOX) injection  40 mg Subcutaneous Q24H   insulin aspart  0-15 Units Subcutaneous TID WC   insulin aspart  0-5 Units Subcutaneous QHS   insulin detemir  5 Units Subcutaneous QHS   ipratropium-albuterol  3 mL Nebulization QID   methylPREDNISolone (SOLU-MEDROL) injection  60 mg Intravenous Q12H   nicotine  14 mg Transdermal Daily   Continuous Infusions:   LOS: 2 days    Time spent: 30 minutes    Barton Dubois, MD [facility]   To contact the attending provider between 7A-7P or the covering provider during after hours  7P-7A, please log into the web site [email or web address] and access using universal [place] password for that web site. If you do not have the password, please call the hospital operator.  08/31/2020, 4:47 PM

## 2020-08-31 NOTE — Plan of Care (Signed)
  Problem: Education: Goal: Knowledge of General Education information will improve Description: Including pain rating scale, medication(s)/side effects and non-pharmacologic comfort measures 08/31/2020 1019 by Luciana Axe, RN Outcome: Progressing 08/31/2020 1018 by Luciana Axe, RN Outcome: Progressing   Problem: Health Behavior/Discharge Planning: Goal: Ability to manage health-related needs will improve 08/31/2020 1019 by Luciana Axe, RN Outcome: Progressing 08/31/2020 1018 by Luciana Axe, RN Outcome: Progressing   Problem: Clinical Measurements: Goal: Ability to maintain clinical measurements within normal limits will improve 08/31/2020 1019 by Luciana Axe, RN Outcome: Progressing 08/31/2020 1018 by Luciana Axe, RN Outcome: Progressing Goal: Will remain free from infection 08/31/2020 1019 by Luciana Axe, RN Outcome: Progressing 08/31/2020 1018 by Luciana Axe, RN Outcome: Progressing Goal: Diagnostic test results will improve 08/31/2020 1019 by Luciana Axe, RN Outcome: Progressing 08/31/2020 1018 by Luciana Axe, RN Outcome: Progressing Goal: Respiratory complications will improve 08/31/2020 1019 by Luciana Axe, RN Outcome: Progressing 08/31/2020 1018 by Luciana Axe, RN Outcome: Progressing Goal: Cardiovascular complication will be avoided 08/31/2020 1019 by Luciana Axe, RN Outcome: Progressing 08/31/2020 1018 by Luciana Axe, RN Outcome: Progressing   Problem: Activity: Goal: Risk for activity intolerance will decrease 08/31/2020 1019 by Luciana Axe, RN Outcome: Progressing 08/31/2020 1018 by Luciana Axe, RN Outcome: Progressing   Problem: Nutrition: Goal: Adequate nutrition will be maintained 08/31/2020 1019 by Luciana Axe, RN Outcome: Progressing 08/31/2020 1018 by Luciana Axe, RN Outcome: Progressing   Problem: Coping: Goal: Level of anxiety will decrease 08/31/2020 1019 by Luciana Axe, RN Outcome: Progressing 08/31/2020 1018 by Luciana Axe,  RN Outcome: Progressing   Problem: Elimination: Goal: Will not experience complications related to bowel motility 08/31/2020 1019 by Luciana Axe, RN Outcome: Progressing 08/31/2020 1018 by Luciana Axe, RN Outcome: Progressing Goal: Will not experience complications related to urinary retention 08/31/2020 1019 by Luciana Axe, RN Outcome: Progressing 08/31/2020 1018 by Luciana Axe, RN Outcome: Progressing   Problem: Pain Managment: Goal: General experience of comfort will improve 08/31/2020 1019 by Luciana Axe, RN Outcome: Progressing 08/31/2020 1018 by Luciana Axe, RN Outcome: Progressing   Problem: Safety: Goal: Ability to remain free from injury will improve 08/31/2020 1019 by Luciana Axe, RN Outcome: Progressing 08/31/2020 1018 by Luciana Axe, RN Outcome: Progressing   Problem: Skin Integrity: Goal: Risk for impaired skin integrity will decrease 08/31/2020 1019 by Luciana Axe, RN Outcome: Progressing 08/31/2020 1018 by Luciana Axe, RN Outcome: Progressing

## 2020-09-01 DIAGNOSIS — J441 Chronic obstructive pulmonary disease with (acute) exacerbation: Principal | ICD-10-CM

## 2020-09-01 LAB — GLUCOSE, CAPILLARY
Glucose-Capillary: 155 mg/dL — ABNORMAL HIGH (ref 70–99)
Glucose-Capillary: 151 mg/dL — ABNORMAL HIGH (ref 70–99)
Glucose-Capillary: 200 mg/dL — ABNORMAL HIGH (ref 70–99)
Glucose-Capillary: 267 mg/dL — ABNORMAL HIGH (ref 70–99)

## 2020-09-01 MED ORDER — ACETYLCYSTEINE 20 % IN SOLN
2.0000 mL | Freq: Three times a day (TID) | RESPIRATORY_TRACT | Status: DC
  Administered 2020-09-01 (×2): 2 mL via RESPIRATORY_TRACT
  Filled 2020-09-01 (×2): qty 4

## 2020-09-01 NOTE — Progress Notes (Signed)
Pt was ambulated on 3L of 02. Prior to walking 02 sat's were 94%, walking with 3L 02 sat's were 93%. Pt states he feels SOB when ambulating despite only a slight decrease in sat's.

## 2020-09-01 NOTE — Progress Notes (Addendum)
PROGRESS NOTE    Miguel Marsh  WPY:[id number] DOB: 08-11-1961 DOA: 08/29/2020 PCP: Miguel Sill, NP   Brief Narrative:  Miguel Breech Hairstonis a 59 y.o.malewith medical history significant ofchronic bronchitis, metastatic non-small cell lung cancer (followed [facility] and currently in remission), hypertension, chronic pain syndrome, anxiety and COPD with chronic respiratory failure (3 L oxygen supplementation at baseline); who presented to the hospital secondary to worsening his shortness of breath. He reports that for the last 4 days he has been experiencing difficulty breathing and the symptom has continued worsening. He expressed associated intermittent productive coughing spells and no improvement in his symptoms by the use of inhalers/nebulizer at home. No fever, no chills, no nausea, no vomiting, no focal weakness, no abdominal pain, no dysuria, no hematuria, no melena, no hematochezia or any other complaints.  Of note, patient continues to smoke 1 to 2 cigarettes/day and expressed to be vaccinated against Covid.  Respiratory panel negative for influenza and COVID-19.  ED Course:Chest x-ray without frank infiltrates, poor air movement appreciated on physical examination and increased wheezing. Patient requiring higher level of oxygen supplementation than his baseline. TRH has been called to me patient for further evaluation and management of acute on chronic respiratory failure with hypoxia due to COPD exacerbation.  11/3: Patient has been admitted with acute on chronic hypoxemic respiratory failure in the setting of COPD exacerbation.  He continues to have ongoing shortness of breath and wheezing as well as cough that is persistent.  He is back to baseline 3 L nasal cannula oxygen, but does not have any significant breath sounds that can be auscultated at the lung bases.  Plan will be to ambulate patient to check for any significant hypoxemia from his baseline as well as  tachypnea.  PT with no further recommendations.  Assessment & Plan:   Active Problems:   COPD exacerbation ([place])   1-acute on chronic respiratory failure with hypoxia-improving -On presentation patient required up to 5 L of oxygen supplementation to keep O2 sat above 90% -Some improvement appreciated we have been able to wean him down to 3-3.5 L nasal cannula (he uses 3 L at baseline). -Continue to have difficulty speaking in full sentences, poor air movement appreciated on auscultation and very short winded/short of breath with activity. -Will continue IV steroids, nebulizer management, oral antibiotics, antitussive medications and the use of flutter valve/incentive spirometry. -Continue supportive care and follow clinical response -Plan to add Mucomyst today as well as chest PT to assist with mucolysis and improve respiratory distress -Ambulatory O2 study.  2-tobacco abuse -Extensive cessation counseling has been provided -Continue the use of nicotine patch.  3-essential hypertension -Blood pressure has remained stable -Will continue monitoring vital signs -Heart healthy diet has been encouraged. -No using antihypertensive agents prior to admission.  4-hyperglycemia with prior history of prediabetes-stable -A1c 6.0 -Elevated CBGs most likely associated with the use of a steroid -Will continue the use of sliding scale insulin.  5-gastroesophageal flux disease -Continue PPI  6-chronic pain and anxiety -Continue as needed anxiolytic and analgesic medication as per home regimen -Mood is a stable and expressed overall pain is well controlled.  7-metastatic non-small cell lung cancer -Continue outpatient follow-up with oncology service -Per records appears to be in remission.   DVT prophylaxis:Lovenox Code Status: Full code Family Communication: None at bedside; pt will call Disposition Plan:  Status is: Inpatient  Remains inpatient appropriate because:IV  treatments appropriate due to intensity of illness or inability to take PO and  Inpatient level of care appropriate due to severity of illness   Dispo: The patient is from: Home              Anticipated d/c is to: Home              Anticipated d/c date is: 1 day              Patient currently is not medically stable to d/c.  He has very diminished lung sounds and becomes short of breath with communication as well as brief ambulation to the commode.  He is still quite below baseline level of functioning and requires ongoing IV steroids and breathing treatments.  Consultants:   None  Procedures:   See below  Antimicrobials:  Anti-infectives (From admission, onward)   Start     Dose/Rate Route Frequency Ordered Stop   08/29/20 1415  doxycycline (VIBRA-TABS) tablet 100 mg        100 mg Oral Every 12 hours 08/29/20 1407 09/03/20 0959       Subjective: Patient seen and evaluated today with ongoing shortness of breath particularly with conversation as well as any movement.  He has an ongoing cough with poor mucus production.  Objective: Vitals:   08/31/20 2136 08/31/20 2151 09/01/20 0724 09/01/20 1105  BP:  (!) 141/78    Pulse:  93    Resp:  20    Temp:  97.6 F (36.4 C)    TempSrc:  Oral    SpO2: 94% 96% 97% 98%  Weight:      Height:       No intake or output data in the 24 hours ending 09/01/20 1243 Filed Weights   08/29/20 2036  Weight: 100.3 kg    Examination:  General exam: Appears calm and comfortable  Respiratory system: Significantly diminished to auscultation bilaterally.  Currently on 3 L nasal cannula oxygen Cardiovascular system: S1 & S2 heard, RRR.  Gastrointestinal system: Abdomen is nondistended, soft and nontender. Central nervous system: Alert and awake. Extremities: No edema Skin: No rashes, lesions or ulcers Psychiatry: Judgement and insight appear normal. Mood & affect appropriate.     Data Reviewed: I have personally reviewed following labs and  imaging studies  CBC: Recent Labs  Lab 08/29/20 0929  WBC 6.3  NEUTROABS 3.6  HGB 15.5  HCT 48.9  MCV 94.2  PLT 347   Basic Metabolic Panel: Recent Labs  Lab 08/29/20 0929  NA 137  K 4.1  CL 97*  CO2 32  GLUCOSE 122*  BUN 8  CREATININE 0.80  CALCIUM 9.1   GFR: Estimated Creatinine Clearance: 110.3 mL/min (by C-G formula based on SCr of 0.8 mg/dL). Liver Function Tests: No results for input(s): AST, ALT, ALKPHOS, BILITOT, PROT, ALBUMIN in the last 168 hours. No results for input(s): LIPASE, AMYLASE in the last 168 hours. No results for input(s): AMMONIA in the last 168 hours. Coagulation Profile: No results for input(s): INR, PROTIME in the last 168 hours. Cardiac Enzymes: No results for input(s): CKTOTAL, CKMB, CKMBINDEX, TROPONINI in the last 168 hours. BNP (last 3 results) No results for input(s): PROBNP in the last 8760 hours. HbA1C: No results for input(s): HGBA1C in the last 72 hours. CBG: Recent Labs  Lab 08/31/20 1133 08/31/20 1707 08/31/20 2152 09/01/20 0740 09/01/20 1126  GLUCAP 89 153* 174* 155* 151*   Lipid Profile: No results for input(s): CHOL, HDL, LDLCALC, TRIG, CHOLHDL, LDLDIRECT in the last 72 hours. Thyroid Function Tests: No results for input(s): TSH,  T4TOTAL, FREET4, T3FREE, THYROIDAB in the last 72 hours. Anemia Panel: No results for input(s): VITAMINB12, FOLATE, FERRITIN, TIBC, IRON, RETICCTPCT in the last 72 hours. Sepsis Labs: No results for input(s): PROCALCITON, LATICACIDVEN in the last 168 hours.  Recent Results (from the past 240 hour(s))  Respiratory Panel by RT PCR (Flu A&B, Covid) - Nasopharyngeal Swab     Status: None   Collection Time: 08/29/20  9:30 AM   Specimen: Nasopharyngeal Swab  Result Value Ref Range Status   SARS Coronavirus 2 by RT PCR NEGATIVE NEGATIVE Final    Comment: (NOTE) SARS-CoV-2 target nucleic acids are NOT DETECTED.  The SARS-CoV-2 RNA is generally detectable in upper respiratoy specimens during  the acute phase of infection. The lowest concentration of SARS-CoV-2 viral copies this assay can detect is 131 copies/mL. A negative result does not preclude SARS-Cov-2 infection and should not be used as the sole basis for treatment or other patient management decisions. A negative result may occur with  improper specimen collection/handling, submission of specimen other than nasopharyngeal swab, presence of viral mutation(s) within the areas targeted by this assay, and inadequate number of viral copies (<131 copies/mL). A negative result must be combined with clinical observations, patient history, and epidemiological information. The expected result is Negative.  Fact Sheet for Patients:  PinkCheek.be  Fact Sheet for Healthcare Providers:  GravelBags.it  This test is no t yet approved or cleared by the [place] FDA and  has been authorized for detection and/or diagnosis of SARS-CoV-2 by FDA under an Emergency Use Authorization (EUA). This EUA will remain  in effect (meaning this test can be used) for the duration of the COVID-19 declaration under Section 564(b)(1) of the Act, 21 U.S.C. section 360bbb-3(b)(1), unless the authorization is terminated or revoked sooner.     Influenza A by PCR NEGATIVE NEGATIVE Final   Influenza B by PCR NEGATIVE NEGATIVE Final    Comment: (NOTE) The Xpert Xpress SARS-CoV-2/FLU/RSV assay is intended as an aid in  the diagnosis of influenza from Nasopharyngeal swab specimens and  should not be used as a sole basis for treatment. Nasal washings and  aspirates are unacceptable for Xpert Xpress SARS-CoV-2/FLU/RSV  testing.  Fact Sheet for Patients: PinkCheek.be  Fact Sheet for Healthcare Providers: GravelBags.it  This test is not yet approved or cleared by the [place] FDA and  has been authorized for detection and/or  diagnosis of SARS-CoV-2 by  FDA under an Emergency Use Authorization (EUA). This EUA will remain  in effect (meaning this test can be used) for the duration of the  Covid-19 declaration under Section 564(b)(1) of the Act, 21  U.S.C. section 360bbb-3(b)(1), unless the authorization is  terminated or revoked. Performed at [facility], [address] [place], [place] [address]          Radiology Studies: No results found.      Scheduled Meds: . (feeding supplement) PROSource Plus  30 mL Oral BID BM  . acetylcysteine  4 mL Nebulization TID  . arformoterol  15 mcg Nebulization BID  . budesonide (PULMICORT) nebulizer solution  0.5 mg Nebulization BID  . dextromethorphan-guaiFENesin  1 tablet Oral BID  . doxycycline  100 mg Oral Q12H  . enoxaparin (LOVENOX) injection  40 mg Subcutaneous Q24H  . insulin aspart  0-15 Units Subcutaneous TID WC  . insulin aspart  0-5 Units Subcutaneous QHS  . insulin detemir  5 Units Subcutaneous QHS  . ipratropium-albuterol  3 mL Nebulization QID  . methylPREDNISolone (SOLU-MEDROL) injection  60 mg Intravenous Q12H  . nicotine  14 mg Transdermal Daily    LOS: 3 days    Time spent: 35 minutes    Reanne Nellums Darleen Crocker, DO [facility]  If 7PM-7AM, please contact night-coverage [email or web address] 09/01/2020, 12:43 PM

## 2020-09-02 LAB — BASIC METABOLIC PANEL
Anion gap: 8 (ref 5–15)
BUN: 17 mg/dL (ref 6–20)
CO2: 29 mmol/L (ref 22–32)
Calcium: 9.3 mg/dL (ref 8.9–10.3)
Chloride: 97 mmol/L — ABNORMAL LOW (ref 98–111)
Creatinine, Ser: 0.82 mg/dL (ref 0.61–1.24)
GFR, Estimated: >60 mL/min (ref >60)
Glucose, Bld: 174 mg/dL — ABNORMAL HIGH (ref 70–99)
Potassium: 4.6 mmol/L (ref 3.5–5.1)
Sodium: 134 mmol/L — ABNORMAL LOW (ref 135–145)

## 2020-09-02 LAB — CBC
HCT: 46.5 % (ref 39.0–52.0)
Hemoglobin: 14.7 g/dL (ref 13.0–17.0)
MCH: 29.7 pg (ref 26.0–34.0)
MCHC: 31.6 g/dL (ref 30.0–36.0)
MCV: 93.9 fL (ref 80.0–100.0)
Platelets: 253 10*3/uL (ref 150–400)
RBC: 4.95 MIL/uL (ref 4.22–5.81)
RDW: 12.3 % (ref 11.5–15.5)
WBC: 13.6 10*3/uL — ABNORMAL HIGH (ref 4.0–10.5)
nRBC: 0 % (ref 0.0–0.2)

## 2020-09-02 LAB — MAGNESIUM: Magnesium: 2.1 mg/dL (ref 1.7–2.4)

## 2020-09-02 LAB — GLUCOSE, CAPILLARY: Glucose-Capillary: 202 mg/dL — ABNORMAL HIGH (ref 70–99)

## 2020-09-02 MED ORDER — PREDNISONE 20 MG PO TABS: 40.0000 mg | ORAL_TABLET | Freq: Every day | ORAL | 0 refills | Status: AC

## 2020-09-02 MED ORDER — NICOTINE 14 MG/24HR TD PT24: 14.0000 mg | MEDICATED_PATCH | Freq: Every day | TRANSDERMAL | 0 refills | Status: DC

## 2020-09-02 MED ORDER — DM-GUAIFENESIN ER 30-600 MG PO TB12: 1.0000 | ORAL_TABLET | Freq: Two times a day (BID) | ORAL | 0 refills | Status: AC

## 2020-09-02 NOTE — Discharge Summary (Signed)
Physician Discharge Summary  Miguel Marsh FBP:[id number] DOB: 04-May-1961 DOA: 08/29/2020  PCP: Adaline Sill, NP  Admit date: 08/29/2020  Discharge date: 09/02/2020  Admitted From:Home  Disposition:  Home  Recommendations for Outpatient Follow-up:  1. Follow up with PCP in 1-2 weeks 2. Continue on prednisone as prescribed for 5 more days and use nebulizer treatments at home as needed for any shortness of breath or wheezing 3. Recommended follow-up to [facility] pulmonology clinic in the next 1-2 weeks with request for follow-up sent. 4. Continue home nasal cannula oxygen as prior, 3 L.  Home Health: None  Equipment/Devices: Chronic 3 L home nasal cannula oxygen  Discharge Condition: Stable  CODE STATUS: Full  Diet recommendation: Heart Healthy  Brief/Interim Summary: Per HPI: Miguel Fukushima Hairstonis a 59 y.o.malewith medical history significant ofchronic bronchitis, metastatic non-small cell lung cancer (followed [place] and currently in remission), hypertension, chronic pain syndrome, anxiety and COPD with chronic respiratory failure (3 L oxygen supplementation at baseline); who presented to the hospital secondary to worsening his shortness of breath. He reports that for the last 4 days he has been experiencing difficulty breathing and the symptom has continued worsening. He expressed associated intermittent productive coughing spells and no improvement in his symptoms by the use of inhalers/nebulizer at home. No fever, no chills, no nausea, no vomiting, no focal weakness, no abdominal pain, no dysuria, no hematuria, no melena, no hematochezia or any other complaints.  Of note, patient continues to smoke 1 to 2 cigarettes/day and expressed to be vaccinated against Covid.  Respiratory panel negative for influenza and COVID-19.  ED Course:Chest x-ray without frank infiltrates, poor air movement appreciated on physical examination and increased wheezing. Patient  requiring higher level of oxygen supplementation than his baseline. TRH has been called to me patient for further evaluation and management of acute on chronic respiratory failure with hypoxia due to COPD exacerbation.  -Patient was admitted for acute on chronic hypoxemic respiratory failure in the setting of COPD exacerbation and had remained on IV steroids as well as breathing treatments and some doxycycline as well.  He has shown slow improvement and is now back to his baseline level of oxygen requirement at 3 L nasal cannula oxygen.  He unfortunately struggled with ongoing symptomatology to include dyspnea on exertion as well as some mild tachypnea with associated cough and congestion.  He states that he continues to have mild symptoms, but is otherwise near his baseline this morning and feels stable enough for discharge.  He does not currently see a pulmonologist, and I have recommended close follow-up in the next 1-2 weeks.  Discharge Diagnoses:  Active Problems:   COPD exacerbation ([place])  Principal discharge diagnosis: Acute on chronic hypoxemic respiratory failure in the setting of COPD exacerbation.  Discharge Instructions  Discharge Instructions    Ambulatory referral to Pulmonology   Complete by: As directed    Hospital follow up needed in 1-2 weeks.   Reason for referral: Asthma/COPD   Diet - low sodium heart healthy   Complete by: As directed    Increase activity slowly   Complete by: As directed      Allergies as of 09/02/2020   No Known Allergies     Medication List    TAKE these medications   albuterol 108 (90 Base) MCG/ACT inhaler Commonly known as: VENTOLIN HFA Inhale 2 puffs into the lungs every 6 (six) hours as needed for wheezing.   albuterol (2.5 MG/3ML) 0.083% nebulizer solution Commonly known as:  PROVENTIL Inhale 2.5 mg into the lungs every 6 (six) hours as needed for wheezing or shortness of breath.   dextromethorphan-guaiFENesin 30-600 MG 12hr  tablet Commonly known as: MUCINEX DM Take 1 tablet by mouth 2 (two) times daily for 10 days.   hydrochlorothiazide 25 MG tablet Commonly known as: HYDRODIURIL Take 25 mg by mouth every morning.   ipratropium-albuterol 0.5-2.5 (3) MG/3ML Soln Commonly known as: DUONEB Take 3 mLs by nebulization every 4 (four) hours as needed. What changed: reasons to take this   nicotine 14 mg/24hr patch Commonly known as: NICODERM CQ - dosed in mg/24 hours Place 1 patch (14 mg total) onto the skin daily. Start taking on: September 03, 2020   oxyCODONE 15 MG immediate release tablet Commonly known as: ROXICODONE Take 15 mg by mouth 4 (four) times daily as needed for pain.   OXYGEN Inhale 3 L into the lungs continuous.   predniSONE 20 MG tablet Commonly known as: Deltasone Take 2 tablets (40 mg total) by mouth daily for 5 days.   Trelegy Ellipta 100-62.5-25 MCG/INH Aepb Generic drug: Fluticasone-Umeclidin-Vilant Inhale 1 puff into the lungs daily.       Follow-up Information    Adaline Sill, NP Follow up in 1 week(s).   Specialty: Internal Medicine Contact information: [address] [place] [address] [phone]        [facility] Follow up in 2 week(s).   Specialty: Pulmonology Contact information: [address] [place] [address] [phone]             No Known Allergies  Consultations:  None   Procedures/Studies: DG Chest Port 1 View  Result Date: 08/29/2020 CLINICAL DATA:  Nonproductive cough for 3 days. EXAM: PORTABLE CHEST 1 VIEW COMPARISON:  04/12/2020 FINDINGS: Cardiac silhouette normal in size. Normal mediastinal and hilar contours. Lungs are hyperexpanded. There is interstitial prominence at the lung bases in addition to linear opacities, the latter finding consistent with atelectasis and/or scarring. No lung consolidation to suggest pneumonia and no evidence of pulmonary edema. No pleural effusion or  pneumothorax. Skeletal structures are grossly intact. IMPRESSION: 1. No acute cardiopulmonary disease. 2. Lung hyperexpansion, mild interstitial thickening and mild lower lung zone linear atelectasis or scarring. Suspect COPD. Electronically Signed   By: Lajean Manes M.D.   On: 08/29/2020 09:52    Discharge Exam: Vitals:   09/02/20 0506 09/02/20 0825  BP: 130/67   Pulse: 73   Resp: 20   Temp: 97.9 F (36.6 C)   SpO2: 99% 98%   Vitals:   09/01/20 1958 09/01/20 2027 09/02/20 0506 09/02/20 0825  BP:  (!) 152/80 130/67   Pulse: 80 88 73   Resp: (!) 22 20 20    Temp:  (!) 97.5 F (36.4 C) 97.9 F (36.6 C)   TempSrc:  Oral Oral   SpO2: 94% 94% 99% 98%  Weight:      Height:        General: Pt is alert, awake, not in acute distress Cardiovascular: RRR, S1/S2 +, no rubs, no gallops Respiratory: CTA bilaterally, no wheezing, no rhonchi, 3 L nasal cannula oxygen Abdominal: Soft, NT, ND, bowel sounds + Extremities: no edema, no cyanosis    The results of significant diagnostics from this hospitalization (including imaging, microbiology, ancillary and laboratory) are listed below for reference.     Microbiology: Recent Results (from the past 240 hour(s))  Respiratory Panel by RT PCR (Flu A&B, Covid) - Nasopharyngeal Swab  Status: None   Collection Time: 08/29/20  9:30 AM   Specimen: Nasopharyngeal Swab  Result Value Ref Range Status   SARS Coronavirus 2 by RT PCR NEGATIVE NEGATIVE Final    Comment: (NOTE) SARS-CoV-2 target nucleic acids are NOT DETECTED.  The SARS-CoV-2 RNA is generally detectable in upper respiratoy specimens during the acute phase of infection. The lowest concentration of SARS-CoV-2 viral copies this assay can detect is 131 copies/mL. A negative result does not preclude SARS-Cov-2 infection and should not be used as the sole basis for treatment or other patient management decisions. A negative result may occur with  improper specimen collection/handling,  submission of specimen other than nasopharyngeal swab, presence of viral mutation(s) within the areas targeted by this assay, and inadequate number of viral copies (<131 copies/mL). A negative result must be combined with clinical observations, patient history, and epidemiological information. The expected result is Negative.  Fact Sheet for Patients:  PinkCheek.be  Fact Sheet for Healthcare Providers:  GravelBags.it  This test is no t yet approved or cleared by the [place] FDA and  has been authorized for detection and/or diagnosis of SARS-CoV-2 by FDA under an Emergency Use Authorization (EUA). This EUA will remain  in effect (meaning this test can be used) for the duration of the COVID-19 declaration under Section 564(b)(1) of the Act, 21 U.S.C. section 360bbb-3(b)(1), unless the authorization is terminated or revoked sooner.     Influenza A by PCR NEGATIVE NEGATIVE Final   Influenza B by PCR NEGATIVE NEGATIVE Final    Comment: (NOTE) The Xpert Xpress SARS-CoV-2/FLU/RSV assay is intended as an aid in  the diagnosis of influenza from Nasopharyngeal swab specimens and  should not be used as a sole basis for treatment. Nasal washings and  aspirates are unacceptable for Xpert Xpress SARS-CoV-2/FLU/RSV  testing.  Fact Sheet for Patients: PinkCheek.be  Fact Sheet for Healthcare Providers: GravelBags.it  This test is not yet approved or cleared by the [place] FDA and  has been authorized for detection and/or diagnosis of SARS-CoV-2 by  FDA under an Emergency Use Authorization (EUA). This EUA will remain  in effect (meaning this test can be used) for the duration of the  Covid-19 declaration under Section 564(b)(1) of the Act, 21  U.S.C. section 360bbb-3(b)(1), unless the authorization is  terminated or revoked. Performed at [facility], [address], [place], [place] [address]      Labs: BNP (last 3 results) Recent Labs    12/11/19 1613 04/12/20 0932  BNP 32.0 74.2   Basic Metabolic Panel: Recent Labs  Lab 08/29/20 0929 09/02/20 0529  NA 137 134*  K 4.1 4.6  CL 97* 97*  CO2 32 29  GLUCOSE 122* 174*  BUN 8 17  CREATININE 0.80 0.82  CALCIUM 9.1 9.3  MG  --  2.1   Liver Function Tests: No results for input(s): AST, ALT, ALKPHOS, BILITOT, PROT, ALBUMIN in the last 168 hours. No results for input(s): LIPASE, AMYLASE in the last 168 hours. No results for input(s): AMMONIA in the last 168 hours. CBC: Recent Labs  Lab 08/29/20 0929 09/02/20 0529  WBC 6.3 13.6*  NEUTROABS 3.6  --   HGB 15.5 14.7  HCT 48.9 46.5  MCV 94.2 93.9  PLT 239 253   Cardiac Enzymes: No results for input(s): CKTOTAL, CKMB, CKMBINDEX, TROPONINI in the last 168 hours. BNP: Invalid input(s): POCBNP CBG: Recent Labs  Lab 09/01/20 0740 09/01/20 1126 09/01/20 1705 09/01/20 2050 09/02/20 0747  GLUCAP 155*  151* 200* 267* 202*   D-Dimer No results for input(s): DDIMER in the last 72 hours. Hgb A1c No results for input(s): HGBA1C in the last 72 hours. Lipid Profile No results for input(s): CHOL, HDL, LDLCALC, TRIG, CHOLHDL, LDLDIRECT in the last 72 hours. Thyroid function studies No results for input(s): TSH, T4TOTAL, T3FREE, THYROIDAB in the last 72 hours.  Invalid input(s): FREET3 Anemia work up No results for input(s): VITAMINB12, FOLATE, FERRITIN, TIBC, IRON, RETICCTPCT in the last 72 hours. Urinalysis    Component Value Date/Time   COLORURINE YELLOW 01/22/2020 1251   APPEARANCEUR CLEAR 01/22/2020 1251   LABSPEC 1.017 01/22/2020 1251   PHURINE 6.0 01/22/2020 1251   GLUCOSEU NEGATIVE 01/22/2020 1251   HGBUR NEGATIVE 01/22/2020 1251   BILIRUBINUR NEGATIVE 01/22/2020 1251   KETONESUR NEGATIVE 01/22/2020 1251   PROTEINUR NEGATIVE 01/22/2020 1251   UROBILINOGEN 0.2 10/21/2013 0200   NITRITE NEGATIVE 01/22/2020 1251    LEUKOCYTESUR NEGATIVE 01/22/2020 1251   Sepsis Labs Invalid input(s): PROCALCITONIN,  WBC,  LACTICIDVEN Microbiology Recent Results (from the past 240 hour(s))  Respiratory Panel by RT PCR (Flu A&B, Covid) - Nasopharyngeal Swab     Status: None   Collection Time: 08/29/20  9:30 AM   Specimen: Nasopharyngeal Swab  Result Value Ref Range Status   SARS Coronavirus 2 by RT PCR NEGATIVE NEGATIVE Final    Comment: (NOTE) SARS-CoV-2 target nucleic acids are NOT DETECTED.  The SARS-CoV-2 RNA is generally detectable in upper respiratoy specimens during the acute phase of infection. The lowest concentration of SARS-CoV-2 viral copies this assay can detect is 131 copies/mL. A negative result does not preclude SARS-Cov-2 infection and should not be used as the sole basis for treatment or other patient management decisions. A negative result may occur with  improper specimen collection/handling, submission of specimen other than nasopharyngeal swab, presence of viral mutation(s) within the areas targeted by this assay, and inadequate number of viral copies (<131 copies/mL). A negative result must be combined with clinical observations, patient history, and epidemiological information. The expected result is Negative.  Fact Sheet for Patients:  PinkCheek.be  Fact Sheet for Healthcare Providers:  GravelBags.it  This test is no t yet approved or cleared by the [place] FDA and  has been authorized for detection and/or diagnosis of SARS-CoV-2 by FDA under an Emergency Use Authorization (EUA). This EUA will remain  in effect (meaning this test can be used) for the duration of the COVID-19 declaration under Section 564(b)(1) of the Act, 21 U.S.C. section 360bbb-3(b)(1), unless the authorization is terminated or revoked sooner.     Influenza A by PCR NEGATIVE NEGATIVE Final   Influenza B by PCR NEGATIVE NEGATIVE Final    Comment:  (NOTE) The Xpert Xpress SARS-CoV-2/FLU/RSV assay is intended as an aid in  the diagnosis of influenza from Nasopharyngeal swab specimens and  should not be used as a sole basis for treatment. Nasal washings and  aspirates are unacceptable for Xpert Xpress SARS-CoV-2/FLU/RSV  testing.  Fact Sheet for Patients: PinkCheek.be  Fact Sheet for Healthcare Providers: GravelBags.it  This test is not yet approved or cleared by the [place] FDA and  has been authorized for detection and/or diagnosis of SARS-CoV-2 by  FDA under an Emergency Use Authorization (EUA). This EUA will remain  in effect (meaning this test can be used) for the duration of the  Covid-19 declaration under Section 564(b)(1) of the Act, 21  U.S.C. section 360bbb-3(b)(1), unless the authorization is  terminated or revoked. Performed at  [address] [place], [place] [address]      Time coordinating discharge: 35 minutes  SIGNED:   Rodena Goldmann, DO [facility] 09/02/2020, 9:04 AM  If 7PM-7AM, please contact night-coverage [email or web address]

## 2020-09-02 NOTE — Progress Notes (Signed)
Patient discharge teaching given, including activity, diet, follow-up appoints, and medications. Patient verbalized understanding of all discharge instructions. IV access was d/c'd. Vitals are stable. Skin is intact except as charted in most recent assessments. Pt to be escorted out by NT, to be driven home by family. 

## 2020-09-02 NOTE — Care Management Important Message (Signed)
Important Message  Patient Details  Name: Miguel Marsh MRN: [id number] Date of Birth: [birth year]   Medicare Important Message Given:  Yes (mailed to address on file)     Tommy Medal 09/02/2020, 12:12 PM

## 2020-10-04 ENCOUNTER — Emergency Department (HOSPITAL_COMMUNITY)
Admission: EM | Admit: 2020-10-04 | Discharge: 2020-10-04 | Disposition: A | Discharge status: Home / Self Care | Payer: Medicare HMO | Source: Home / Self Care | Attending: Emergency Medicine | Admitting: Emergency Medicine

## 2020-10-04 ENCOUNTER — Other Ambulatory Visit: Payer: Self-pay

## 2020-10-04 ENCOUNTER — Emergency Department (HOSPITAL_COMMUNITY): Payer: Medicare HMO

## 2020-10-04 ENCOUNTER — Encounter (HOSPITAL_COMMUNITY): Payer: Self-pay | Admitting: Emergency Medicine

## 2020-10-04 DIAGNOSIS — Z96641 Presence of right artificial hip joint: Secondary | ICD-10-CM | POA: Insufficient documentation

## 2020-10-04 DIAGNOSIS — I10 Essential (primary) hypertension: Secondary | ICD-10-CM | POA: Insufficient documentation

## 2020-10-04 DIAGNOSIS — Z87891 Personal history of nicotine dependence: Secondary | ICD-10-CM | POA: Insufficient documentation

## 2020-10-04 DIAGNOSIS — Z79899 Other long term (current) drug therapy: Secondary | ICD-10-CM | POA: Insufficient documentation

## 2020-10-04 DIAGNOSIS — Z20822 Contact with and (suspected) exposure to covid-19: Secondary | ICD-10-CM | POA: Insufficient documentation

## 2020-10-04 DIAGNOSIS — Z85118 Personal history of other malignant neoplasm of bronchus and lung: Secondary | ICD-10-CM | POA: Insufficient documentation

## 2020-10-04 DIAGNOSIS — J441 Chronic obstructive pulmonary disease with (acute) exacerbation: Secondary | ICD-10-CM

## 2020-10-04 LAB — CBC WITH DIFFERENTIAL/PLATELET
Abs Immature Granulocytes: 0.04 10*3/uL (ref 0.00–0.07)
Basophils Absolute: 0 10*3/uL (ref 0.0–0.1)
Basophils Relative: 0 %
Eosinophils Absolute: 0 10*3/uL (ref 0.0–0.5)
Eosinophils Relative: 1 %
HCT: 48.9 % (ref 39.0–52.0)
Hemoglobin: 15.3 g/dL (ref 13.0–17.0)
Immature Granulocytes: 1 %
Lymphocytes Relative: 22 %
Lymphs Abs: 1.9 10*3/uL (ref 0.7–4.0)
MCH: 29.9 pg (ref 26.0–34.0)
MCHC: 31.3 g/dL (ref 30.0–36.0)
MCV: 95.5 fL (ref 80.0–100.0)
Monocytes Absolute: 0.7 10*3/uL (ref 0.1–1.0)
Monocytes Relative: 9 %
Neutro Abs: 5.7 10*3/uL (ref 1.7–7.7)
Neutrophils Relative %: 67 %
Platelets: 246 10*3/uL (ref 150–400)
RBC: 5.12 MIL/uL (ref 4.22–5.81)
RDW: 12 % (ref 11.5–15.5)
WBC: 8.5 10*3/uL (ref 4.0–10.5)
nRBC: 0 % (ref 0.0–0.2)

## 2020-10-04 LAB — BASIC METABOLIC PANEL
Anion gap: 9 (ref 5–15)
BUN: 6 mg/dL (ref 6–20)
CO2: 31 mmol/L (ref 22–32)
Calcium: 8.7 mg/dL — ABNORMAL LOW (ref 8.9–10.3)
Chloride: 98 mmol/L (ref 98–111)
Creatinine, Ser: 0.96 mg/dL (ref 0.61–1.24)
GFR, Estimated: >60 mL/min (ref >60)
Glucose, Bld: 119 mg/dL — ABNORMAL HIGH (ref 70–99)
Potassium: 4.5 mmol/L (ref 3.5–5.1)
Sodium: 138 mmol/L (ref 135–145)

## 2020-10-04 LAB — RESP PANEL BY RT-PCR (FLU A&B, COVID) ARPGX2
Influenza A by PCR: NEGATIVE
Influenza B by PCR: NEGATIVE
SARS Coronavirus 2 by RT PCR: NEGATIVE

## 2020-10-04 LAB — BRAIN NATRIURETIC PEPTIDE: B Natriuretic Peptide: 212 pg/mL — ABNORMAL HIGH (ref 0.0–100.0)

## 2020-10-04 LAB — TROPONIN I (HIGH SENSITIVITY): Troponin I (High Sensitivity): 5 ng/L (ref <18)

## 2020-10-04 MED ORDER — ALBUTEROL SULFATE HFA 108 (90 BASE) MCG/ACT IN AERS
2.0000 | INHALATION_SPRAY | Freq: Once | RESPIRATORY_TRACT | Status: AC
  Administered 2020-10-04: 2 via RESPIRATORY_TRACT
  Filled 2020-10-04: qty 6.7

## 2020-10-04 MED ORDER — METHYLPREDNISOLONE SODIUM SUCC 40 MG IJ SOLR
60.0000 mg | Freq: Once | INTRAMUSCULAR | Status: AC
  Administered 2020-10-04: 60 mg via INTRAVENOUS
  Filled 2020-10-04: qty 2

## 2020-10-04 MED ORDER — PREDNISONE 10 MG PO TABS: 20.0000 mg | ORAL_TABLET | Freq: Every day | ORAL | 0 refills | Status: DC

## 2020-10-04 MED ORDER — IPRATROPIUM-ALBUTEROL 0.5-2.5 (3) MG/3ML IN SOLN
3.0000 mL | Freq: Once | RESPIRATORY_TRACT | Status: AC
  Administered 2020-10-04: 3 mL via RESPIRATORY_TRACT
  Filled 2020-10-04: qty 3

## 2020-10-04 NOTE — ED Notes (Signed)
Respiratory notified of neb treatment needs

## 2020-10-04 NOTE — ED Provider Notes (Signed)
[facility] EMERGENCY DEPARTMENT Provider Note   CSN: [id number] Arrival date & time: 10/04/20  0741     History Chief Complaint  Patient presents with  . Shortness of Breath    Miguel Marsh is a 59 y.o. male.  Patient presents with chief complaint shortness of breath.  Patient states symptoms started again last night.  He typically gets episodes of shortness of breath multiple times over the course of the year.  He denies any chest pain or fevers or cough or vomiting or diarrhea.  He uses an inhaler at home without improvement so presents to the ER for further evaluation.        Past Medical History:  Diagnosis Date  . Arthritis   . Bronchitis   . Cancer [facility])    metastatic NSCLC (followed by [facility])  . COPD (chronic obstructive pulmonary disease) ([place])   . HTN (hypertension)     Patient Active Problem List   Diagnosis Date Noted  . Goals of care, counseling/discussion   . Palliative care by specialist   . DNR (do not resuscitate) discussion   . Hyperglycemia 04/12/2020  . Respiratory failure ([place]) 01/22/2020  . Acute respiratory failure ([place]) 01/23/2018  . Acute on chronic respiratory failure with hypoxia and hypercapnia ([place]) 01/23/2018  . Acute on chronic respiratory failure with hypoxia ([place]) 01/01/2018  . Obesity, Class III, BMI 40-49.9 (morbid obesity) ([place]) 01/01/2018  . COPD with exacerbation ([place]) 01/01/2018  . Essential hypertension 12/09/2015  . Non-small cell cancer of right lung (in remission) 12/09/2015  . GERD (gastroesophageal reflux disease) 12/09/2015  . COPD with acute exacerbation ([place]) 12/17/2014  . Tobacco use disorder 12/17/2014  . COPD exacerbation ([place]) 12/17/2014  . Obesity 12/17/2014  . Dyspnea   . Difficulty in walking(719.7) 04/02/2013  . Hip weakness 04/02/2013    Past Surgical History:  Procedure Laterality Date  . LUNG BIOPSY    . TOTAL HIP ARTHROPLASTY    . TUMOR REMOVAL Right 12/04/2017       No family history on  file.  Social History   Tobacco Use  . Smoking status: Former Smoker    Packs/day: 0.10    Types: Cigarettes  . Smokeless tobacco: Never Used  Substance Use Topics  . Alcohol use: Yes    Comment: occ  . Drug use: No    Types: Marijuana, Cocaine    Comment: former    Home Medications Prior to Admission medications   Medication Sig Start Date End Date Taking? Authorizing Provider  albuterol (PROVENTIL HFA;VENTOLIN HFA) 108 (90 Base) MCG/ACT inhaler Inhale 2 puffs into the lungs every 6 (six) hours as needed for wheezing. 07/17/18   Roxan Hockey, MD  albuterol (PROVENTIL) (2.5 MG/3ML) 0.083% nebulizer solution Inhale 2.5 mg into the lungs every 6 (six) hours as needed for wheezing or shortness of breath.  01/19/20   [provider]  Fluticasone-Umeclidin-Vilant (TRELEGY ELLIPTA) 100-62.5-25 MCG/INH AEPB Inhale 1 puff into the lungs daily.    [provider]  hydrochlorothiazide (HYDRODIURIL) 25 MG tablet Take 25 mg by mouth every morning. 07/02/20   [provider]  ipratropium-albuterol (DUONEB) 0.5-2.5 (3) MG/3ML SOLN Take 3 mLs by nebulization every 4 (four) hours as needed. Patient taking differently: Take 3 mLs by nebulization every 4 (four) hours as needed (shortness of breath/wheezing).  12/16/19   [name], MD  nicotine (NICODERM CQ - DOSED IN MG/24 HOURS) 14 mg/24hr patch Place 1 patch (14 mg total) onto the skin daily. 09/03/20  Manuella Ghazi, Pratik D, DO  oxyCODONE (ROXICODONE) 15 MG immediate release tablet Take 15 mg by mouth 4 (four) times daily as needed for pain.  08/13/20   [provider]  OXYGEN Inhale 3 L into the lungs continuous.     [provider]  predniSONE (DELTASONE) 10 MG tablet Take 2 tablets (20 mg total) by mouth daily for 5 days. 10/04/20 10/09/20  Luna Fuse, MD    Allergies    Patient has no known allergies.  Review of Systems   Review of Systems  Constitutional: Negative for fever.  HENT: Negative for ear  pain and sore throat.   Eyes: Negative for pain.  Respiratory: Positive for shortness of breath. Negative for cough.   Cardiovascular: Negative for chest pain.  Gastrointestinal: Negative for abdominal pain.  Genitourinary: Negative for flank pain.  Musculoskeletal: Negative for back pain.  Skin: Negative for color change and rash.  Neurological: Negative for syncope.  All other systems reviewed and are negative.   Physical Exam Updated Vital Signs BP 136/89   Pulse 84   Temp 98.4 F (36.9 C) (Oral)   Resp 17   Ht 5\' 6"  (1.676 m)   Wt 100.3 kg   SpO2 97%   BMI 35.69 kg/m   Physical Exam Constitutional:      General: He is not in acute distress.    Appearance: He is well-developed.  HENT:     Head: Normocephalic.     Mouth/Throat:     Mouth: Mucous membranes are moist.  Cardiovascular:     Rate and Rhythm: Normal rate.  Pulmonary:     Effort: Pulmonary effort is normal.     Breath sounds: Wheezing present.  Abdominal:     Palpations: Abdomen is soft.  Musculoskeletal:     Right lower leg: No edema.     Left lower leg: No edema.  Skin:    General: Skin is warm.     Capillary Refill: Capillary refill takes less than 2 seconds.  Neurological:     General: No focal deficit present.     Mental Status: He is alert.     ED Results / Procedures / Treatments   Labs (all labs ordered are listed, but only abnormal results are displayed) Labs Reviewed  BASIC METABOLIC PANEL - Abnormal; Notable for the following components:      Result Value   Glucose, Bld 119 (*)    Calcium 8.7 (*)    All other components within normal limits  BRAIN NATRIURETIC PEPTIDE - Abnormal; Notable for the following components:   B Natriuretic Peptide 212.0 (*)    All other components within normal limits  RESP PANEL BY RT-PCR (FLU A&B, COVID) ARPGX2  CBC WITH DIFFERENTIAL/PLATELET  TROPONIN I (HIGH SENSITIVITY)    EKG EKG Interpretation  Date/Time:  Monday October 04 2020 07:56:33  EST Ventricular Rate:  95 PR Interval:    QRS Duration: 80 QT Interval:  333 QTC Calculation: 419 R Axis:   9 Text Interpretation: Sinus rhythm Probable anteroseptal infarct, old No STE, no ST depressions, normal rate Confirmed by Thamas Jaegers ([id number]) on 10/04/2020 8:22:45 AM   Radiology DG Chest Portable 1 View  Result Date: 10/04/2020 CLINICAL DATA:  Shortness of breath. EXAM: PORTABLE CHEST 1 VIEW COMPARISON:  08/29/2020 FINDINGS: Stable top-normal heart size. Stable chronic lung disease with interstitial and bronchial prominence as well as probable emphysematous component. Stable scattered pulmonary scarring bilaterally. There is no evidence of pulmonary edema, consolidation, pneumothorax or  pleural fluid. Visualized bony structures are unremarkable. IMPRESSION: No active disease. Electronically Signed   By: Aletta Edouard M.D.   On: 10/04/2020 08:19    Procedures Procedures (including critical care time)  Medications Ordered in ED Medications  ipratropium-albuterol (DUONEB) 0.5-2.5 (3) MG/3ML nebulizer solution 3 mL (has no administration in time range)  methylPREDNISolone sodium succinate (SOLU-MEDROL) 40 mg/mL injection 60 mg (60 mg Intravenous Given 10/04/20 0841)  albuterol (VENTOLIN HFA) 108 (90 Base) MCG/ACT inhaler 2 puff (2 puffs Inhalation Given 10/04/20 8032)    ED Course  I have reviewed the triage vital signs and the nursing notes.  Pertinent labs & imaging results that were available during my care of the patient were reviewed by me and considered in my medical decision making (see chart for details).    MDM Rules/Calculators/A&P                          Baseline patient is on 3 L nasal cannula, vital signs upon arrival appear within his normal limits.  Chest x-ray is unremarkable labs unremarkable troponin is normal range.  Patient given steroids and breathing treatment with subsequent improvement.  Will be given his prescription of prednisone to go home with.   Advised follow-up with his primary care doctor in 2 or 3 days.  Advised immediate return for worsening symptoms trouble breathing or any additional concerns.   Final Clinical Impression(s) / ED Diagnoses Final diagnoses:  COPD exacerbation ([place])    Rx / DC Orders ED Discharge Orders         Ordered    predniSONE (DELTASONE) 10 MG tablet  Daily        10/04/20 1028           Luna Fuse, MD 10/04/20 1028

## 2020-10-04 NOTE — ED Triage Notes (Signed)
Pt reports SHOB that started last night. Denies pain. Pt on 3L [place] chronically.

## 2020-10-04 NOTE — Discharge Instructions (Signed)
Call your primary care doctor or specialist as discussed in the next 2-3 days.   Return immediately back to the ER if:  Your symptoms worsen within the next 12-24 hours. You develop new symptoms such as new fevers, persistent vomiting, new pain, shortness of breath, or new weakness or numbness, or if you have any other concerns.  

## 2020-10-04 NOTE — ED Notes (Signed)
Handoff received from [place], Therapist, sports.

## 2020-10-06 ENCOUNTER — Emergency Department (HOSPITAL_COMMUNITY): Payer: Medicare HMO

## 2020-10-06 ENCOUNTER — Encounter (HOSPITAL_COMMUNITY): Payer: Self-pay | Admitting: Emergency Medicine

## 2020-10-06 ENCOUNTER — Other Ambulatory Visit: Payer: Self-pay

## 2020-10-06 ENCOUNTER — Inpatient Hospital Stay (HOSPITAL_COMMUNITY)
Admission: EM | Admit: 2020-10-06 | Discharge: 2020-10-10 | DRG: 190 | Disposition: A | Discharge status: Home / Self Care | Payer: Medicare HMO | Attending: Internal Medicine | Admitting: Internal Medicine

## 2020-10-06 ENCOUNTER — Observation Stay (HOSPITAL_COMMUNITY): Payer: Medicare HMO

## 2020-10-06 DIAGNOSIS — I10 Essential (primary) hypertension: Secondary | ICD-10-CM | POA: Diagnosis present

## 2020-10-06 DIAGNOSIS — J9621 Acute and chronic respiratory failure with hypoxia: Secondary | ICD-10-CM | POA: Diagnosis not present

## 2020-10-06 DIAGNOSIS — Z9981 Dependence on supplemental oxygen: Secondary | ICD-10-CM

## 2020-10-06 DIAGNOSIS — J441 Chronic obstructive pulmonary disease with (acute) exacerbation: Secondary | ICD-10-CM | POA: Diagnosis not present

## 2020-10-06 DIAGNOSIS — D72829 Elevated white blood cell count, unspecified: Secondary | ICD-10-CM

## 2020-10-06 DIAGNOSIS — G894 Chronic pain syndrome: Secondary | ICD-10-CM | POA: Diagnosis present

## 2020-10-06 DIAGNOSIS — Z79899 Other long term (current) drug therapy: Secondary | ICD-10-CM

## 2020-10-06 DIAGNOSIS — F17200 Nicotine dependence, unspecified, uncomplicated: Secondary | ICD-10-CM | POA: Diagnosis present

## 2020-10-06 DIAGNOSIS — K219 Gastro-esophageal reflux disease without esophagitis: Secondary | ICD-10-CM | POA: Diagnosis not present

## 2020-10-06 DIAGNOSIS — Z20822 Contact with and (suspected) exposure to covid-19: Secondary | ICD-10-CM | POA: Diagnosis present

## 2020-10-06 DIAGNOSIS — Z7952 Long term (current) use of systemic steroids: Secondary | ICD-10-CM

## 2020-10-06 DIAGNOSIS — Z87891 Personal history of nicotine dependence: Secondary | ICD-10-CM

## 2020-10-06 DIAGNOSIS — I119 Hypertensive heart disease without heart failure: Secondary | ICD-10-CM | POA: Diagnosis present

## 2020-10-06 DIAGNOSIS — Z85118 Personal history of other malignant neoplasm of bronchus and lung: Secondary | ICD-10-CM

## 2020-10-06 DIAGNOSIS — Z6835 Body mass index (BMI) 35.0-35.9, adult: Secondary | ICD-10-CM

## 2020-10-06 DIAGNOSIS — E669 Obesity, unspecified: Secondary | ICD-10-CM | POA: Diagnosis present

## 2020-10-06 DIAGNOSIS — Z7951 Long term (current) use of inhaled steroids: Secondary | ICD-10-CM

## 2020-10-06 DIAGNOSIS — Z716 Tobacco abuse counseling: Secondary | ICD-10-CM

## 2020-10-06 DIAGNOSIS — F411 Generalized anxiety disorder: Secondary | ICD-10-CM | POA: Diagnosis present

## 2020-10-06 DIAGNOSIS — C3491 Malignant neoplasm of unspecified part of right bronchus or lung: Secondary | ICD-10-CM | POA: Diagnosis present

## 2020-10-06 LAB — MAGNESIUM: Magnesium: 1.9 mg/dL (ref 1.7–2.4)

## 2020-10-06 LAB — CBC WITH DIFFERENTIAL/PLATELET
Abs Immature Granulocytes: 0.07 10*3/uL (ref 0.00–0.07)
Basophils Absolute: 0.1 10*3/uL (ref 0.0–0.1)
Basophils Relative: 0 %
Eosinophils Absolute: 0 10*3/uL (ref 0.0–0.5)
Eosinophils Relative: 0 %
HCT: 46.6 % (ref 39.0–52.0)
Hemoglobin: 14.4 g/dL (ref 13.0–17.0)
Immature Granulocytes: 1 %
Lymphocytes Relative: 21 %
Lymphs Abs: 3 10*3/uL (ref 0.7–4.0)
MCH: 29.9 pg (ref 26.0–34.0)
MCHC: 30.9 g/dL (ref 30.0–36.0)
MCV: 96.7 fL (ref 80.0–100.0)
Monocytes Absolute: 1.9 10*3/uL — ABNORMAL HIGH (ref 0.1–1.0)
Monocytes Relative: 13 %
Neutro Abs: 9.5 10*3/uL — ABNORMAL HIGH (ref 1.7–7.7)
Neutrophils Relative %: 65 %
Platelets: 254 10*3/uL (ref 150–400)
RBC: 4.82 MIL/uL (ref 4.22–5.81)
RDW: 12.6 % (ref 11.5–15.5)
WBC: 14.6 10*3/uL — ABNORMAL HIGH (ref 4.0–10.5)
nRBC: 0 % (ref 0.0–0.2)

## 2020-10-06 LAB — GLUCOSE, CAPILLARY: Glucose-Capillary: 115 mg/dL — ABNORMAL HIGH (ref 70–99)

## 2020-10-06 LAB — BASIC METABOLIC PANEL
Anion gap: 6 (ref 5–15)
BUN: 12 mg/dL (ref 6–20)
CO2: 37 mmol/L — ABNORMAL HIGH (ref 22–32)
Calcium: 8.9 mg/dL (ref 8.9–10.3)
Chloride: 98 mmol/L (ref 98–111)
Creatinine, Ser: 1.28 mg/dL — ABNORMAL HIGH (ref 0.61–1.24)
GFR, Estimated: >60 mL/min (ref >60)
Glucose, Bld: 119 mg/dL — ABNORMAL HIGH (ref 70–99)
Potassium: 3.9 mmol/L (ref 3.5–5.1)
Sodium: 141 mmol/L (ref 135–145)

## 2020-10-06 LAB — PHOSPHORUS: Phosphorus: 2.5 mg/dL (ref 2.5–4.6)

## 2020-10-06 MED ORDER — IOHEXOL 350 MG/ML SOLN
100.0000 mL | Freq: Once | INTRAVENOUS | Status: AC | PRN
  Administered 2020-10-06: 100 mL via INTRAVENOUS

## 2020-10-06 MED ORDER — METHYLPREDNISOLONE SODIUM SUCC 40 MG IJ SOLR
40.0000 mg | Freq: Two times a day (BID) | INTRAMUSCULAR | Status: DC
  Administered 2020-10-06 – 2020-10-10 (×9): 40 mg via INTRAVENOUS
  Filled 2020-10-06 (×9): qty 1

## 2020-10-06 MED ORDER — DOXYCYCLINE HYCLATE 100 MG PO TABS
100.0000 mg | ORAL_TABLET | Freq: Two times a day (BID) | ORAL | Status: DC
  Administered 2020-10-06 – 2020-10-10 (×9): 100 mg via ORAL
  Filled 2020-10-06 (×9): qty 1

## 2020-10-06 MED ORDER — OXYCODONE HCL 5 MG PO TABS
15.0000 mg | ORAL_TABLET | Freq: Four times a day (QID) | ORAL | Status: DC | PRN
  Administered 2020-10-06 – 2020-10-10 (×8): 15 mg via ORAL
  Filled 2020-10-06 (×9): qty 3

## 2020-10-06 MED ORDER — IPRATROPIUM-ALBUTEROL 0.5-2.5 (3) MG/3ML IN SOLN: 3.0000 mL | RESPIRATORY_TRACT | Status: DC | PRN

## 2020-10-06 MED ORDER — IPRATROPIUM BROMIDE 0.02 % IN SOLN
RESPIRATORY_TRACT | Status: AC
  Administered 2020-10-06: 1 mg
  Filled 2020-10-06: qty 5

## 2020-10-06 MED ORDER — PANTOPRAZOLE SODIUM 40 MG PO TBEC
40.0000 mg | DELAYED_RELEASE_TABLET | Freq: Every day | ORAL | Status: DC
  Administered 2020-10-06 – 2020-10-10 (×5): 40 mg via ORAL
  Filled 2020-10-06 (×5): qty 1

## 2020-10-06 MED ORDER — IPRATROPIUM-ALBUTEROL 0.5-2.5 (3) MG/3ML IN SOLN
3.0000 mL | Freq: Four times a day (QID) | RESPIRATORY_TRACT | Status: DC
  Administered 2020-10-06 (×3): 3 mL via RESPIRATORY_TRACT
  Filled 2020-10-06 (×3): qty 3

## 2020-10-06 MED ORDER — ENOXAPARIN SODIUM 40 MG/0.4ML ~~LOC~~ SOLN
40.0000 mg | SUBCUTANEOUS | Status: DC
  Administered 2020-10-06 – 2020-10-10 (×5): 40 mg via SUBCUTANEOUS
  Filled 2020-10-06 (×5): qty 0.4

## 2020-10-06 MED ORDER — ALBUTEROL (5 MG/ML) CONTINUOUS INHALATION SOLN
INHALATION_SOLUTION | RESPIRATORY_TRACT | Status: AC
  Filled 2020-10-06: qty 20

## 2020-10-06 MED ORDER — ACETAMINOPHEN 325 MG PO TABS: 650.0000 mg | ORAL_TABLET | Freq: Four times a day (QID) | ORAL | Status: DC | PRN

## 2020-10-06 MED ORDER — DM-GUAIFENESIN ER 30-600 MG PO TB12
1.0000 | ORAL_TABLET | Freq: Two times a day (BID) | ORAL | Status: DC
  Administered 2020-10-06 – 2020-10-10 (×9): 1 via ORAL
  Filled 2020-10-06 (×9): qty 1

## 2020-10-06 MED ORDER — NICOTINE 14 MG/24HR TD PT24
14.0000 mg | MEDICATED_PATCH | Freq: Every day | TRANSDERMAL | Status: DC
  Administered 2020-10-06 – 2020-10-10 (×4): 14 mg via TRANSDERMAL
  Filled 2020-10-06 (×5): qty 1

## 2020-10-06 MED ORDER — IPRATROPIUM-ALBUTEROL 0.5-2.5 (3) MG/3ML IN SOLN
3.0000 mL | Freq: Once | RESPIRATORY_TRACT | Status: DC
  Filled 2020-10-06: qty 3

## 2020-10-06 MED ORDER — IPRATROPIUM-ALBUTEROL 0.5-2.5 (3) MG/3ML IN SOLN
3.0000 mL | Freq: Three times a day (TID) | RESPIRATORY_TRACT | Status: DC
  Administered 2020-10-07 – 2020-10-10 (×10): 3 mL via RESPIRATORY_TRACT
  Filled 2020-10-06 (×9): qty 3

## 2020-10-06 MED ORDER — DEXAMETHASONE SODIUM PHOSPHATE 10 MG/ML IJ SOLN
10.0000 mg | Freq: Once | INTRAMUSCULAR | Status: AC
  Administered 2020-10-06: 10 mg via INTRAVENOUS
  Filled 2020-10-06: qty 1

## 2020-10-06 NOTE — H&P (Signed)
History and Physical  Miguel Marsh AST:[id number] DOB: [birth year] DOA: 10/06/2020  Referring physician: Merryl Hacker, MD PCP: Adaline Sill, NP  Patient coming from:   Chief Complaint: Shortness of breath  HPI: Miguel Marsh is a 59 y.o. male with medical history significant for chronic bronchitis, metastatic non-small cell lung cancer (followed [facility] and currently in remission), hypertension, chronic pain syndrome, anxiety and COPD with chronic respiratory failure (3 L oxygen supplementation at baseline) who presented to the emergency department due to worsening shortness of breath that has been ongoing for about 3 days.  He was seen in the ED on 12/6 due to same symptoms, breathing treatment and Solu-Medrol were given in the ED and patient was discharged with prednisone.  Patient was also admitted from 10/31-11/4 due to same symptoms.  Patient states that he has been using home nebulizer and prednisone at home without improvement, he denies fever, chills, cough, sick contacts.  Patient is fully vaccinated against COVID-19.  EMS was activated and patient was placed on NRB in route to the ED.  ED Course:  In the emergency department, he was intermittently tachypneic, tachycardic and O2 sat was 94-100% on NRB.  Work-up in the ED showed normal CBC and BMP except for leukocytosis. Chest x-ray showed no active disease. Breathing treatment was provided and IV Decadron 10 Mg x1 was given.  Hospitalist was asked to admit patient for further evaluation and management.  Review of Systems: Constitutional: Negative for chills and fever.  HENT: Negative for ear pain and sore throat.   Eyes: Negative for pain and visual disturbance.  Respiratory: Positive for wheezing and shortness of breath.   Cardiovascular: Negative for chest pain and palpitations.  Gastrointestinal: Negative for abdominal pain and vomiting.  Endocrine: Negative for polyphagia and polyuria.  Genitourinary:  Negative for decreased urine volume, dysuria, enuresis Musculoskeletal: Negative for arthralgias and back pain.  Skin: Negative for color change and rash.  Allergic/Immunologic: Negative for immunocompromised state.  Neurological: Negative for tremors, syncope, speech difficulty, weakness, light-headedness and headaches.  Hematological: Does not bruise/bleed easily.  All other systems reviewed and are negative   Past Medical History:  Diagnosis Date  . Arthritis   . Bronchitis   . Cancer [facility])    metastatic NSCLC (followed by [facility])  . COPD (chronic obstructive pulmonary disease) ([place])   . HTN (hypertension)    Past Surgical History:  Procedure Laterality Date  . LUNG BIOPSY    . TOTAL HIP ARTHROPLASTY    . TUMOR REMOVAL Right 12/04/2017    Social History:  reports that he has quit smoking. His smoking use included cigarettes. He smoked 0.10 packs per day. He has never used smokeless tobacco. He reports current alcohol use. He reports that he does not use drugs.   No Known Allergies  History reviewed. No pertinent family history.    Prior to Admission medications   Medication Sig Start Date End Date Taking? Authorizing Provider  albuterol (PROVENTIL HFA;VENTOLIN HFA) 108 (90 Base) MCG/ACT inhaler Inhale 2 puffs into the lungs every 6 (six) hours as needed for wheezing. 07/17/18   Roxan Hockey, MD  albuterol (PROVENTIL) (2.5 MG/3ML) 0.083% nebulizer solution Inhale 2.5 mg into the lungs every 6 (six) hours as needed for wheezing or shortness of breath.  01/19/20   [provider]  Fluticasone-Umeclidin-Vilant (TRELEGY ELLIPTA) 100-62.5-25 MCG/INH AEPB Inhale 1 puff into the lungs daily.    [provider]  hydrochlorothiazide (HYDRODIURIL) 25 MG tablet Take 25 mg  by mouth every morning. 07/02/20   [provider]  ipratropium-albuterol (DUONEB) 0.5-2.5 (3) MG/3ML SOLN Take 3 mLs by nebulization every 4 (four) hours as needed. Patient taking differently:  Take 3 mLs by nebulization every 4 (four) hours as needed (shortness of breath/wheezing).  12/16/19   [name], MD  nicotine (NICODERM CQ - DOSED IN MG/24 HOURS) 14 mg/24hr patch Place 1 patch (14 mg total) onto the skin daily. 09/03/20   Manuella Ghazi, Pratik D, DO  oxyCODONE (ROXICODONE) 15 MG immediate release tablet Take 15 mg by mouth 4 (four) times daily as needed for pain.  08/13/20   [provider]  OXYGEN Inhale 3 L into the lungs continuous.     [provider]  predniSONE (DELTASONE) 10 MG tablet Take 2 tablets (20 mg total) by mouth daily for 5 days. 10/04/20 10/09/20  Luna Fuse, MD    Physical Exam: BP (!) 141/74   Pulse (!) 103   Resp (!) 21   Ht 5\' 6"  (1.676 m)   Wt 100.3 kg   SpO2 96%   BMI 35.69 kg/m   . General: 59 y.o. year-old male well developed well nourished in no acute distress.  Alert and oriented x3. . Cardiovascular: Tachycardia.  Regular rate and rhythm with no rubs or gallops.  No thyromegaly or JVD noted.  No lower extremity edema. 2/4 pulses in all 4 extremities. Marland Kitchen Respiratory: Diffuse mild expiratory wheezing on auscultation.  No rales.  . Abdomen: Soft nontender nondistended with normal bowel sounds x4 quadrants. . Muskuloskeletal: No cyanosis, clubbing or edema noted bilaterally . Neuro: CN II-XII intact, strength, sensation, reflexes . Skin: No ulcerative lesions noted or rashes . Psychiatry: Judgement and insight appear normal. Mood is appropriate for condition and setting          Labs on Admission:  Basic Metabolic Panel: Recent Labs  Lab 10/04/20 0816 10/06/20 0236  NA 138 141  K 4.5 3.9  CL 98 98  CO2 31 37*  GLUCOSE 119* 119*  BUN 6 12  CREATININE 0.96 1.28*  CALCIUM 8.7* 8.9   Liver Function Tests: No results for input(s): AST, ALT, ALKPHOS, BILITOT, PROT, ALBUMIN in the last 168 hours. No results for input(s): LIPASE, AMYLASE in the last 168 hours. No results for input(s): AMMONIA in the last 168  hours. CBC: Recent Labs  Lab 10/04/20 0816 10/06/20 0236  WBC 8.5 14.6*  NEUTROABS 5.7 9.5*  HGB 15.3 14.4  HCT 48.9 46.6  MCV 95.5 96.7  PLT 246 254   Cardiac Enzymes: No results for input(s): CKTOTAL, CKMB, CKMBINDEX, TROPONINI in the last 168 hours.  BNP (last 3 results) Recent Labs    12/11/19 1613 04/12/20 0932 10/04/20 0859  BNP 32.0 33.0 212.0*    ProBNP (last 3 results) No results for input(s): PROBNP in the last 8760 hours.  CBG: No results for input(s): GLUCAP in the last 168 hours.  Radiological Exams on Admission: DG Chest Portable 1 View  Result Date: 10/06/2020 CLINICAL DATA:  Shortness of breath EXAM: PORTABLE CHEST 1 VIEW COMPARISON:  October 04, 2020 FINDINGS: The heart size and mediastinal contours are within normal limits. Again noted are chronic mildly increased interstitial markings and bronchial wall thickening throughout both lungs. No large airspace consolidation or pleural effusion. The visualized skeletal structures are unremarkable. IMPRESSION: No active disease. Electronically Signed   By: Prudencio Pair M.D.   On: 10/06/2020 03:02   DG Chest Portable 1 View  Result Date: 10/04/2020 CLINICAL  DATA:  Shortness of breath. EXAM: PORTABLE CHEST 1 VIEW COMPARISON:  08/29/2020 FINDINGS: Stable top-normal heart size. Stable chronic lung disease with interstitial and bronchial prominence as well as probable emphysematous component. Stable scattered pulmonary scarring bilaterally. There is no evidence of pulmonary edema, consolidation, pneumothorax or pleural fluid. Visualized bony structures are unremarkable. IMPRESSION: No active disease. Electronically Signed   By: Aletta Edouard M.D.   On: 10/04/2020 08:19    EKG: I independently viewed the EKG done and my findings are as followed: Sinus tachycardia at a rate of 102 bpm  Assessment/Plan Present on Admission: . COPD with acute exacerbation ([place]) . Acute on chronic respiratory failure with hypoxia  ([place]) . Tobacco use disorder . Essential hypertension . GERD (gastroesophageal reflux disease) . Non-small cell cancer of right lung (in remission)  Principal Problem:   COPD with acute exacerbation (HCC) Active Problems:   Tobacco use disorder   Essential hypertension   Non-small cell cancer of right lung (in remission)   GERD (gastroesophageal reflux disease)   Acute on chronic respiratory failure with hypoxia (HCC)   Leukocytosis   Acute on chronic respiratory failure with hypoxia secondary to COPD exacerbation  Continue duo nebs, Mucinex, Solu-Medrol, azithromycin. Continue Protonix to prevent steroid-induced ulcer Continue incentive spirometry and flutter valve Continue supplemental oxygen to maintain O2 sat > 92% with plan to wean patient off oxygen as tolerated  Leukocytosis possibly secondary to steroid use WBC 14.6; patient denies fever and no obvious acute infectious process noted Continue to monitor WBC with morning labs  GERD Continue Protonix  Essential hypertension (controlled) No antihypertensive medication noted on med rec We shall await updated med rec.  Tobacco use disorder Patient counseled on tobacco abuse cessation, Continue nicotine patch  History of metastatic non-small cell lung cancer (in remission) Patient will continue outpatient follow-up with oncology service   DVT prophylaxis: Lovenox  Code Status: Full code  Family Communication: None at bedside  Disposition Plan:  Patient is from:                        home Anticipated DC to:                  home Anticipated DC date:             1 day Anticipated DC barriers:         Patient is unstable to be discharged at this time due to acute expiration of COPD requiring further treatment   Consults called: None  Admission status: Observation    Bernadette Hoit MD [facility]  10/06/2020, 5:56 AM

## 2020-10-06 NOTE — ED Triage Notes (Signed)
Pt arrives via [facility] for shortness of breath. Pt recently discharged for the same. Pt arrives on NRB. Pt chronically on 3L  at home.

## 2020-10-06 NOTE — Care Management Obs Status (Signed)
[place] NOTIFICATION   Patient Details  Name: Miguel Marsh MRN: [id number] Date of Birth: July 18, 1961   Medicare Observation Status Notification Given:  Yes    Tommy Medal 10/06/2020, 4:15 PM

## 2020-10-06 NOTE — ED Notes (Signed)
Pt was given breakfast tray 

## 2020-10-06 NOTE — Progress Notes (Signed)
Patient seen and examined.  Admitted early morning hours by nighttime hospitalist.  59 year old gentleman with history of non-small cell lung cancer currently in remission and under surveillance at [facility], chronic pain syndrome, anxiety, COPD on 3 L of oxygen at home who is a still smoking presents back to the ER with worsening shortness of breath and congestion after recently seen in the hospital.  In the emergency department he was intermittently tachypneic and tachycardic.  Initially started on nonrebreather.  Chest x-ray with no active disease.  Given severity of symptoms admitted to hospital.  COPD with acute exacerbation: Agree with admission to monitored unit because of severity of symptoms. Aggressive bronchodilator therapy, IV steroids, inhalational steroids, scheduled and as needed bronchodilators, deep breathing exercises, incentive spirometry, chest physiotherapy and respiratory therapy consult. Antibiotics due to severity of symptoms.  Will start patient on doxycycline for 7 days. Supplemental oxygen to keep saturations more than 92%. Resume his home medications. Due to significant symptoms and recurrent shortness of breath with history of cancer, a CT scan of the chest was done that was negative for pulmonary embolism, it does show some nodularity in chronic scaring.  Negative for consolidation.  Patient with severe symptoms needing inpatient treatment.  He will stay in the hospital until clinical stabilization.  Anticipate hospital stay more than 2 midnights.

## 2020-10-06 NOTE — ED Provider Notes (Signed)
[facility] EMERGENCY DEPARTMENT Provider Note   CSN: [id number] Arrival date & time: 10/06/20  0203     History Chief Complaint  Patient presents with  . Shortness of Breath    Miguel Marsh is a 59 y.o. male.  HPI     This a 59 year old male with a history of COPD, hypertension who presents with shortness of breath.  Patient was seen and evaluated for the same 2 days ago.  He improved with duo nebs in the ER.  He was discharged home with prednisone.  He reports that he has been using his nebulizer and prednisone at home.  However just prior to arrival had acute onset of worsening shortness of breath.  No recent fevers or cough.  No sick contacts.  He is fully vaccinated against COVID-19.  He has not noted any lower extremity swelling.  No chest pain.  He is on 3 L of home oxygen.  He was placed on a nonrebreather by EMS.  Past Medical History:  Diagnosis Date  . Arthritis   . Bronchitis   . Cancer [facility])    metastatic NSCLC (followed by [facility])  . COPD (chronic obstructive pulmonary disease) ([place])   . HTN (hypertension)     Patient Active Problem List   Diagnosis Date Noted  . Goals of care, counseling/discussion   . Palliative care by specialist   . DNR (do not resuscitate) discussion   . Hyperglycemia 04/12/2020  . Respiratory failure ([place]) 01/22/2020  . Acute respiratory failure ([place]) 01/23/2018  . Acute on chronic respiratory failure with hypoxia and hypercapnia ([place]) 01/23/2018  . Acute on chronic respiratory failure with hypoxia ([place]) 01/01/2018  . Obesity, Class III, BMI 40-49.9 (morbid obesity) ([place]) 01/01/2018  . COPD with exacerbation ([place]) 01/01/2018  . Essential hypertension 12/09/2015  . Non-small cell cancer of right lung (in remission) 12/09/2015  . GERD (gastroesophageal reflux disease) 12/09/2015  . COPD with acute exacerbation ([place]) 12/17/2014  . Tobacco use disorder 12/17/2014  . COPD exacerbation ([place]) 12/17/2014  . Obesity 12/17/2014  . Dyspnea    . Difficulty in walking(719.7) 04/02/2013  . Hip weakness 04/02/2013    Past Surgical History:  Procedure Laterality Date  . LUNG BIOPSY    . TOTAL HIP ARTHROPLASTY    . TUMOR REMOVAL Right 12/04/2017       History reviewed. No pertinent family history.  Social History   Tobacco Use  . Smoking status: Former Smoker    Packs/day: 0.10    Types: Cigarettes  . Smokeless tobacco: Never Used  Substance Use Topics  . Alcohol use: Yes    Comment: occ  . Drug use: No    Types: Marijuana, Cocaine    Comment: former    Home Medications Prior to Admission medications   Medication Sig Start Date End Date Taking? Authorizing Provider  albuterol (PROVENTIL HFA;VENTOLIN HFA) 108 (90 Base) MCG/ACT inhaler Inhale 2 puffs into the lungs every 6 (six) hours as needed for wheezing. 07/17/18   Roxan Hockey, MD  albuterol (PROVENTIL) (2.5 MG/3ML) 0.083% nebulizer solution Inhale 2.5 mg into the lungs every 6 (six) hours as needed for wheezing or shortness of breath.  01/19/20   [provider]  Fluticasone-Umeclidin-Vilant (TRELEGY ELLIPTA) 100-62.5-25 MCG/INH AEPB Inhale 1 puff into the lungs daily.    [provider]  hydrochlorothiazide (HYDRODIURIL) 25 MG tablet Take 25 mg by mouth every morning. 07/02/20   [provider]  ipratropium-albuterol (DUONEB) 0.5-2.5 (3) MG/3ML SOLN Take 3 mLs by  nebulization every 4 (four) hours as needed. Patient taking differently: Take 3 mLs by nebulization every 4 (four) hours as needed (shortness of breath/wheezing).  12/16/19   [name], MD  nicotine (NICODERM CQ - DOSED IN MG/24 HOURS) 14 mg/24hr patch Place 1 patch (14 mg total) onto the skin daily. 09/03/20   Manuella Ghazi, Pratik D, DO  oxyCODONE (ROXICODONE) 15 MG immediate release tablet Take 15 mg by mouth 4 (four) times daily as needed for pain.  08/13/20   [provider]  OXYGEN Inhale 3 L into the lungs continuous.     [provider]  predniSONE (DELTASONE)  10 MG tablet Take 2 tablets (20 mg total) by mouth daily for 5 days. 10/04/20 10/09/20  Luna Fuse, MD    Allergies    Patient has no known allergies.  Review of Systems   Review of Systems  Constitutional: Negative for fever.  Respiratory: Positive for shortness of breath and wheezing. Negative for cough.   Cardiovascular: Negative for chest pain and leg swelling.  Gastrointestinal: Negative for abdominal pain, nausea and vomiting.  All other systems reviewed and are negative.   Physical Exam Updated Vital Signs BP (!) 136/112   Pulse (!) 102   Resp 16   Ht 1.676 m (5\' 6" )   Wt 100.3 kg   SpO2 94%   BMI 35.69 kg/m   Physical Exam Vitals and nursing note reviewed.  Constitutional:      Appearance: He is well-developed.  HENT:     Head: Normocephalic and atraumatic.  Eyes:     Pupils: Pupils are equal, round, and reactive to light.  Cardiovascular:     Rate and Rhythm: Regular rhythm. Tachycardia present.     Heart sounds: Normal heart sounds. No murmur heard.   Pulmonary:     Effort: Pulmonary effort is normal. No respiratory distress.     Breath sounds: Wheezing present.     Comments: Fair air movement in all lung fields, diffuse expiratory wheezing noted, slight tachypnea, no accessory muscle use Abdominal:     General: Bowel sounds are normal.     Palpations: Abdomen is soft.     Tenderness: There is no abdominal tenderness. There is no rebound.  Musculoskeletal:     Cervical back: Neck supple.     Right lower leg: No tenderness. No edema.     Left lower leg: No tenderness. No edema.  Skin:    General: Skin is warm and dry.  Neurological:     Mental Status: He is alert and oriented to person, place, and time.  Psychiatric:        Mood and Affect: Mood normal.     ED Results / Procedures / Treatments   Labs (all labs ordered are listed, but only abnormal results are displayed) Labs Reviewed  CBC WITH DIFFERENTIAL/PLATELET - Abnormal; Notable for the  following components:      Result Value   WBC 14.6 (*)    Neutro Abs 9.5 (*)    Monocytes Absolute 1.9 (*)    All other components within normal limits  BASIC METABOLIC PANEL - Abnormal; Notable for the following components:   CO2 37 (*)    Glucose, Bld 119 (*)    Creatinine, Ser 1.28 (*)    All other components within normal limits    EKG EKG Interpretation  Date/Time:  Wednesday October 06 2020 02:20:55 EST Ventricular Rate:  102 PR Interval:    QRS Duration: 80 QT Interval:  320  QTC Calculation: 409 R Axis:   66 Text Interpretation: Sinus tachycardia Multiform ventricular premature complexes Confirmed by Thayer Jew [phone]) on 10/06/2020 3:23:47 AM   Radiology DG Chest Portable 1 View  Result Date: 10/06/2020 CLINICAL DATA:  Shortness of breath EXAM: PORTABLE CHEST 1 VIEW COMPARISON:  October 04, 2020 FINDINGS: The heart size and mediastinal contours are within normal limits. Again noted are chronic mildly increased interstitial markings and bronchial wall thickening throughout both lungs. No large airspace consolidation or pleural effusion. The visualized skeletal structures are unremarkable. IMPRESSION: No active disease. Electronically Signed   By: Prudencio Pair M.D.   On: 10/06/2020 03:02   DG Chest Portable 1 View  Result Date: 10/04/2020 CLINICAL DATA:  Shortness of breath. EXAM: PORTABLE CHEST 1 VIEW COMPARISON:  08/29/2020 FINDINGS: Stable top-normal heart size. Stable chronic lung disease with interstitial and bronchial prominence as well as probable emphysematous component. Stable scattered pulmonary scarring bilaterally. There is no evidence of pulmonary edema, consolidation, pneumothorax or pleural fluid. Visualized bony structures are unremarkable. IMPRESSION: No active disease. Electronically Signed   By: Aletta Edouard M.D.   On: 10/04/2020 08:19    Procedures .Critical Care Performed by: Merryl Hacker, MD Authorized by: Merryl Hacker, MD    Critical care provider statement:    Critical care time (minutes):  31   Critical care was necessary to treat or prevent imminent or life-threatening deterioration of the following conditions:  Respiratory failure   Critical care was time spent personally by me on the following activities:  Discussions with consultants, evaluation of patient's response to treatment, examination of patient, ordering and performing treatments and interventions, ordering and review of laboratory studies, ordering and review of radiographic studies, pulse oximetry, re-evaluation of patient's condition, obtaining history from patient or surrogate and review of old charts   (including critical care time)  Medications Ordered in ED Medications  ipratropium-albuterol (DUONEB) 0.5-2.5 (3) MG/3ML nebulizer solution 3 mL (has no administration in time range)  ipratropium (ATROVENT) 0.02 % nebulizer solution (1 mg  Given 10/06/20 0230)  albuterol (VENTOLIN) (5 MG/ML) 0.5% continuous inhalation solution (  Given 10/06/20 0230)  dexamethasone (DECADRON) injection 10 mg (10 mg Intravenous Given 10/06/20 8563)    ED Course  I have reviewed the triage vital signs and the nursing notes.  Pertinent labs & imaging results that were available during my care of the patient were reviewed by me and considered in my medical decision making (see chart for details).  Clinical Course as of Oct 07 447  Wed Oct 06, 2020  1497 On recheck, patient reports improvement after continuous DuoNeb although he states he still feels "a little tight."  He remains mildly tachypneic.  He is satting 89 to 90% on his home oxygen requirement.  He continues to have some poor air movement with expiratory wheezing noted.  Will repeat DuoNeb.  He will need admission for frequent duo nebs for presumed COPD exacerbation.   [CH]    Clinical Course User Index [CH] Helga Asbury, Barbette Hair, MD   MDM Rules/Calculators/A&P                          Patient  presents with shortness of breath.  Tight on initial evaluation with tachypnea and hypoxia.  He is wheezing.  He has chronic hypoxic respiratory failure and frequent COPD exacerbations.  He clinically does not appear volume overloaded and doubt CHF.  He is not having any chest pain and  EKG is without acute ischemic or arrhythmic changes.  Chest x-ray obtained and shows no evidence of pneumothorax or pneumonia.  Covid testing from 12/6 was negative.  Patient was initially given a continuous DuoNeb.  See clinical course above.  He remains mildly tachypneic but improved.  Will order repeat DuoNeb.  Feel he would benefit from admission for frequent duo nebs for COPD exacerbation.  He was given Decadron.  He may require a longer taper of outpatient steroids on discharge.  Final Clinical Impression(s) / ED Diagnoses Final diagnoses:  COPD exacerbation ([place])  Acute on chronic respiratory failure with hypoxia [facility])    Rx / DC Orders ED Discharge Orders    None       Davis Vannatter, Barbette Hair, MD 10/06/20 [phone]

## 2020-10-07 DIAGNOSIS — F411 Generalized anxiety disorder: Secondary | ICD-10-CM | POA: Diagnosis present

## 2020-10-07 DIAGNOSIS — Z7952 Long term (current) use of systemic steroids: Secondary | ICD-10-CM | POA: Diagnosis not present

## 2020-10-07 DIAGNOSIS — Z9981 Dependence on supplemental oxygen: Secondary | ICD-10-CM | POA: Diagnosis not present

## 2020-10-07 DIAGNOSIS — K219 Gastro-esophageal reflux disease without esophagitis: Secondary | ICD-10-CM | POA: Diagnosis present

## 2020-10-07 DIAGNOSIS — E669 Obesity, unspecified: Secondary | ICD-10-CM | POA: Diagnosis present

## 2020-10-07 DIAGNOSIS — J9621 Acute and chronic respiratory failure with hypoxia: Secondary | ICD-10-CM | POA: Diagnosis present

## 2020-10-07 DIAGNOSIS — Z716 Tobacco abuse counseling: Secondary | ICD-10-CM | POA: Diagnosis not present

## 2020-10-07 DIAGNOSIS — I119 Hypertensive heart disease without heart failure: Secondary | ICD-10-CM | POA: Diagnosis present

## 2020-10-07 DIAGNOSIS — Z79899 Other long term (current) drug therapy: Secondary | ICD-10-CM | POA: Diagnosis not present

## 2020-10-07 DIAGNOSIS — Z87891 Personal history of nicotine dependence: Secondary | ICD-10-CM | POA: Diagnosis not present

## 2020-10-07 DIAGNOSIS — Z85118 Personal history of other malignant neoplasm of bronchus and lung: Secondary | ICD-10-CM | POA: Diagnosis not present

## 2020-10-07 DIAGNOSIS — J441 Chronic obstructive pulmonary disease with (acute) exacerbation: Secondary | ICD-10-CM | POA: Diagnosis present

## 2020-10-07 DIAGNOSIS — Z20822 Contact with and (suspected) exposure to covid-19: Secondary | ICD-10-CM | POA: Diagnosis present

## 2020-10-07 DIAGNOSIS — G894 Chronic pain syndrome: Secondary | ICD-10-CM | POA: Diagnosis present

## 2020-10-07 DIAGNOSIS — Z7951 Long term (current) use of inhaled steroids: Secondary | ICD-10-CM | POA: Diagnosis not present

## 2020-10-07 DIAGNOSIS — Z6835 Body mass index (BMI) 35.0-35.9, adult: Secondary | ICD-10-CM | POA: Diagnosis not present

## 2020-10-07 LAB — COMPREHENSIVE METABOLIC PANEL
ALT: 13 U/L (ref 0–44)
AST: 13 U/L — ABNORMAL LOW (ref 15–41)
Albumin: 3.3 g/dL — ABNORMAL LOW (ref 3.5–5.0)
Alkaline Phosphatase: 84 U/L (ref 38–126)
Anion gap: 7 (ref 5–15)
BUN: 11 mg/dL (ref 6–20)
CO2: 34 mmol/L — ABNORMAL HIGH (ref 22–32)
Calcium: 9.1 mg/dL (ref 8.9–10.3)
Chloride: 96 mmol/L — ABNORMAL LOW (ref 98–111)
Creatinine, Ser: 0.89 mg/dL (ref 0.61–1.24)
GFR, Estimated: >60 mL/min (ref >60)
Glucose, Bld: 221 mg/dL — ABNORMAL HIGH (ref 70–99)
Potassium: 4.5 mmol/L (ref 3.5–5.1)
Sodium: 137 mmol/L (ref 135–145)
Total Bilirubin: 0.5 mg/dL (ref 0.3–1.2)
Total Protein: 6.1 g/dL — ABNORMAL LOW (ref 6.5–8.1)

## 2020-10-07 LAB — CBC
HCT: 44.7 % (ref 39.0–52.0)
Hemoglobin: 13.8 g/dL (ref 13.0–17.0)
MCH: 29.7 pg (ref 26.0–34.0)
MCHC: 30.9 g/dL (ref 30.0–36.0)
MCV: 96.1 fL (ref 80.0–100.0)
Platelets: 249 10*3/uL (ref 150–400)
RBC: 4.65 MIL/uL (ref 4.22–5.81)
RDW: 12.3 % (ref 11.5–15.5)
WBC: 14.3 10*3/uL — ABNORMAL HIGH (ref 4.0–10.5)
nRBC: 0 % (ref 0.0–0.2)

## 2020-10-07 LAB — GLUCOSE, CAPILLARY
Glucose-Capillary: 118 mg/dL — ABNORMAL HIGH (ref 70–99)
Glucose-Capillary: 139 mg/dL — ABNORMAL HIGH (ref 70–99)
Glucose-Capillary: 132 mg/dL — ABNORMAL HIGH (ref 70–99)

## 2020-10-07 LAB — PROTIME-INR
INR: 1 (ref 0.8–1.2)
Prothrombin Time: 12.5 seconds (ref 11.4–15.2)

## 2020-10-07 LAB — APTT: aPTT: 24 seconds (ref 24–36)

## 2020-10-07 NOTE — Progress Notes (Signed)
RT called for PRN neb tx due to pt c/o sob. Expiratory wheezing noted when lung sounds assessed.

## 2020-10-07 NOTE — Plan of Care (Signed)

## 2020-10-07 NOTE — Progress Notes (Signed)
PROGRESS NOTE    Miguel Marsh  EFE:[id number] DOB: Jul 06, 1961 DOA: 10/06/2020 PCP: Adaline Sill, NP    Brief Narrative:  59 year old gentleman with history of non-small cell lung cancer currently in remission treated with Keytruda, chronic pain syndrome, anxiety, COPD on chronic hypoxic respiratory failure on 3 L of oxygen at home, still smoking presented back to the ER with ongoing shortness of breath and wheezing.  In the emergency room hemodynamically stable.  Intermittently tachypneic and tachycardic.  Initially treated with nonrebreather.  Chest x-ray with no active disease.  COVID-19 negative.   Assessment & Plan:   Principal Problem:   COPD with acute exacerbation ([place]) Active Problems:   Tobacco use disorder   Essential hypertension   Non-small cell cancer of right lung (in remission)   GERD (gastroesophageal reflux disease)   Acute on chronic respiratory failure with hypoxia (HCC)   Leukocytosis  COPD with acute exacerbation: Acute on chronic hypoxic respiratory failure. Agree with monitoring in the hospital due to significant symptoms.  Still has significant wheezing and shortness of breath.   Aggressive bronchodilator therapy, IV steroids, inhalational steroids on hold.  Scheduled and as needed bronchodilators, deep breathing exercises, incentive spirometry, chest physiotherapy and respiratory therapy consult. Antibiotics due to severity of symptoms.  Doxycycline for 7 days. Supplemental oxygen to keep saturations more than 92%.  Smoker: Counseled to quit.  On nicotine patch.  Hypertension: Blood pressure stable.  Resume home medication including hydrochlorothiazide.  Lung cancer: Follows at [facility].  Currently in remission.  A CTA was negative for PE.   DVT prophylaxis: enoxaparin (LOVENOX) injection 40 mg Start: 10/06/20 0800 SCDs Start: 10/06/20 0615   Code Status: Full code Family Communication: None Disposition Plan: Status is:  Observation  The patient will require care spanning > 2 midnights and should be moved to inpatient because: Inpatient level of care appropriate due to severity of illness  Dispo: The patient is from: Home              Anticipated d/c is to: Home              Anticipated d/c date is: 2 days              Patient currently is not medically stable to d/c.  Patient continues to have wheezing and shortness of breath.  He needs IV steroids and breathing treatments and needs to remain in the hospital.       Consultants:   None  Procedures:   None  Antimicrobials:   Doxycycline, 12/8---   Subjective: Patient was seen and examined.  No other overnight events.  Pain is well controlled.  Patient is audibly wheezing.  Has some cough.  Afebrile.  Objective: Vitals:   10/06/20 2311 10/07/20 0327 10/07/20 0615 10/07/20 0728  BP: 128/76 137/88 131/78   Pulse: 80 79 80   Resp: 20 20 20    Temp: 98 F (36.7 C) 97.9 F (36.6 C) 98.5 F (36.9 C)   TempSrc: Oral Oral Oral   SpO2: 96% 98% 100% 97%  Weight:      Height:        Intake/Output Summary (Last 24 hours) at 10/07/2020 1243 Last data filed at 10/06/2020 2300 Gross per 24 hour  Intake 480 ml  Output --  Net 480 ml   Filed Weights   10/06/20 0214 10/06/20 1457 10/06/20 1530  Weight: 100.3 kg 100.3 kg 100.3 kg    Examination:  General exam: Appears calm and comfortable  In mild respiratory distress, on 3 L oxygen. Respiratory system: Scattered expiratory wheezes.  Mild increased work of breathing. Cardiovascular system: S1 & S2 heard, RRR. No JVD, murmurs, rubs, gallops or clicks. No pedal edema. Gastrointestinal system: Abdomen is nondistended, soft and nontender. No organomegaly or masses felt. Normal bowel sounds heard. Central nervous system: Alert and oriented. No focal neurological deficits. Extremities: Symmetric 5 x 5 power. Skin: No rashes, lesions or ulcers Psychiatry: Judgement and insight appear normal.  Mood & affect appropriate.     Data Reviewed: I have personally reviewed following labs and imaging studies  CBC: Recent Labs  Lab 10/04/20 0816 10/06/20 0236 10/07/20 0503  WBC 8.5 14.6* 14.3*  NEUTROABS 5.7 9.5*  --   HGB 15.3 14.4 13.8  HCT 48.9 46.6 44.7  MCV 95.5 96.7 96.1  PLT 246 254 037   Basic Metabolic Panel: Recent Labs  Lab 10/04/20 0816 10/06/20 0236 10/06/20 0623 10/07/20 0503  NA 138 141  --  137  K 4.5 3.9  --  4.5  CL 98 98  --  96*  CO2 31 37*  --  34*  GLUCOSE 119* 119*  --  221*  BUN 6 12  --  11  CREATININE 0.96 1.28*  --  0.89  CALCIUM 8.7* 8.9  --  9.1  MG  --   --  1.9  --   PHOS  --   --  2.5  --    GFR: Estimated Creatinine Clearance: 99.1 mL/min (by C-G formula based on SCr of 0.89 mg/dL). Liver Function Tests: Recent Labs  Lab 10/07/20 0503  AST 13*  ALT 13  ALKPHOS 84  BILITOT 0.5  PROT 6.1*  ALBUMIN 3.3*   No results for input(s): LIPASE, AMYLASE in the last 168 hours. No results for input(s): AMMONIA in the last 168 hours. Coagulation Profile: Recent Labs  Lab 10/07/20 0503  INR 1.0   Cardiac Enzymes: No results for input(s): CKTOTAL, CKMB, CKMBINDEX, TROPONINI in the last 168 hours. BNP (last 3 results) No results for input(s): PROBNP in the last 8760 hours. HbA1C: No results for input(s): HGBA1C in the last 72 hours. CBG: Recent Labs  Lab 10/06/20 2152 10/07/20 0759 10/07/20 1130  GLUCAP 115* 118* 139*   Lipid Profile: No results for input(s): CHOL, HDL, LDLCALC, TRIG, CHOLHDL, LDLDIRECT in the last 72 hours. Thyroid Function Tests: No results for input(s): TSH, T4TOTAL, FREET4, T3FREE, THYROIDAB in the last 72 hours. Anemia Panel: No results for input(s): VITAMINB12, FOLATE, FERRITIN, TIBC, IRON, RETICCTPCT in the last 72 hours. Sepsis Labs: No results for input(s): PROCALCITON, LATICACIDVEN in the last 168 hours.  Recent Results (from the past 240 hour(s))  Resp Panel by RT-PCR (Flu A&B, Covid)  Nasopharyngeal Swab     Status: None   Collection Time: 10/04/20  8:00 AM   Specimen: Nasopharyngeal Swab; Nasopharyngeal(NP) swabs in vial transport medium  Result Value Ref Range Status   SARS Coronavirus 2 by RT PCR NEGATIVE NEGATIVE Final    Comment: (NOTE) SARS-CoV-2 target nucleic acids are NOT DETECTED.  The SARS-CoV-2 RNA is generally detectable in upper respiratory specimens during the acute phase of infection. The lowest concentration of SARS-CoV-2 viral copies this assay can detect is 138 copies/mL. A negative result does not preclude SARS-Cov-2 infection and should not be used as the sole basis for treatment or other patient management decisions. A negative result may occur with  improper specimen collection/handling, submission of specimen other than nasopharyngeal swab, presence of viral  mutation(s) within the areas targeted by this assay, and inadequate number of viral copies(<138 copies/mL). A negative result must be combined with clinical observations, patient history, and epidemiological information. The expected result is Negative.  Fact Sheet for Patients:  EntrepreneurPulse.com.au  Fact Sheet for Healthcare Providers:  IncredibleEmployment.be  This test is no t yet approved or cleared by the [place] FDA and  has been authorized for detection and/or diagnosis of SARS-CoV-2 by FDA under an Emergency Use Authorization (EUA). This EUA will remain  in effect (meaning this test can be used) for the duration of the COVID-19 declaration under Section 564(b)(1) of the Act, 21 U.S.C.section 360bbb-3(b)(1), unless the authorization is terminated  or revoked sooner.       Influenza A by PCR NEGATIVE NEGATIVE Final   Influenza B by PCR NEGATIVE NEGATIVE Final    Comment: (NOTE) The Xpert Xpress SARS-CoV-2/FLU/RSV plus assay is intended as an aid in the diagnosis of influenza from Nasopharyngeal swab specimens and should not be  used as a sole basis for treatment. Nasal washings and aspirates are unacceptable for Xpert Xpress SARS-CoV-2/FLU/RSV testing.  Fact Sheet for Patients: EntrepreneurPulse.com.au  Fact Sheet for Healthcare Providers: IncredibleEmployment.be  This test is not yet approved or cleared by the [place] FDA and has been authorized for detection and/or diagnosis of SARS-CoV-2 by FDA under an Emergency Use Authorization (EUA). This EUA will remain in effect (meaning this test can be used) for the duration of the COVID-19 declaration under Section 564(b)(1) of the Act, 21 U.S.C. section 360bbb-3(b)(1), unless the authorization is terminated or revoked.  Performed at [facility], [address], [place], [place] [address]          Radiology Studies: CT ANGIO CHEST PE W OR WO CONTRAST  Result Date: 10/06/2020 CLINICAL DATA:  Lung cancer suspected. Recurrent shortness of breath. EXAM: CT ANGIOGRAPHY CHEST WITH CONTRAST TECHNIQUE: Multidetector CT imaging of the chest was performed using the standard protocol during bolus administration of intravenous contrast. Multiplanar CT image reconstructions and MIPs were obtained to evaluate the vascular anatomy. CONTRAST:  182mL OMNIPAQUE IOHEXOL 350 MG/ML SOLN COMPARISON:  07/16/2018 FINDINGS: Cardiovascular: Satisfactory opacification of the pulmonary arteries to the segmental level. No evidence of pulmonary embolism when accounting for levels of streak artifact. Normal heart size. No pericardial effusion. Coronary atherosclerotic calcifications. Mediastinum/Nodes: Negative for adenopathy or mass Lungs/Pleura: Chronic airway thickening with clustered nodularity at the right base where a dominant lobulated nodule measures up to 1 cm. Mild scarring at the lingula. Upper Abdomen: Partially covered gallbladder and cholelithiasis. The stomach is distended by a recent meal. Left upper pole cystic density. Musculoskeletal: No  acute or aggressive finding Review of the MIP images confirms the above findings. IMPRESSION: 1. Negative for pulmonary embolism or other acute finding. 2. Chronic airway thickening and right lower lobe nodularity, the latter mildly progressed from 2019. The patient had a surveillance chest CT at [facility] 07/15/2020. 3. Coronary atherosclerosis. Electronically Signed   By: Monte Fantasia M.D.   On: 10/06/2020 10:05   DG Chest Portable 1 View  Result Date: 10/06/2020 CLINICAL DATA:  Shortness of breath EXAM: PORTABLE CHEST 1 VIEW COMPARISON:  October 04, 2020 FINDINGS: The heart size and mediastinal contours are within normal limits. Again noted are chronic mildly increased interstitial markings and bronchial wall thickening throughout both lungs. No large airspace consolidation or pleural effusion. The visualized skeletal structures are unremarkable. IMPRESSION: No active disease. Electronically Signed   By: Ebony Cargo.D.  On: 10/06/2020 03:02        Scheduled Meds: . dextromethorphan-guaiFENesin  1 tablet Oral BID  . doxycycline  100 mg Oral Q12H  . enoxaparin (LOVENOX) injection  40 mg Subcutaneous Q24H  . ipratropium-albuterol  3 mL Nebulization Once  . ipratropium-albuterol  3 mL Nebulization TID  . methylPREDNISolone (SOLU-MEDROL) injection  40 mg Intravenous Q12H  . nicotine  14 mg Transdermal Daily  . pantoprazole  40 mg Oral Daily   Continuous Infusions:   LOS: 0 days    Time spent: 30 minutes    Barb Merino, MD [facility] Pager [phone]

## 2020-10-08 LAB — GLUCOSE, CAPILLARY
Glucose-Capillary: 162 mg/dL — ABNORMAL HIGH (ref 70–99)
Glucose-Capillary: 204 mg/dL — ABNORMAL HIGH (ref 70–99)

## 2020-10-08 NOTE — Progress Notes (Signed)
PROGRESS NOTE    Miguel Marsh  POE:[id number] DOB: Dec 14, 1960 DOA: 10/06/2020 PCP: Adaline Sill, NP    Brief Narrative:  59 year old gentleman with history of non-small cell lung cancer currently in remission treated with Keytruda, chronic pain syndrome, anxiety, COPD on chronic hypoxic respiratory failure on 3 L of oxygen at home, still smoking presented back to the ER with ongoing shortness of breath and wheezing.  In the emergency room hemodynamically stable.  Intermittently tachypneic and tachycardic.  Initially treated with nonrebreather.  Chest x-ray with no active disease.  COVID-19 negative.   Assessment & Plan:   Principal Problem:   COPD with acute exacerbation ([place]) Active Problems:   Tobacco use disorder   Essential hypertension   Non-small cell cancer of right lung (in remission)   GERD (gastroesophageal reflux disease)   Acute on chronic respiratory failure with hypoxia (HCC)   Leukocytosis  COPD with acute exacerbation: Acute on chronic hypoxic respiratory failure. Patient still has wheezing and congestion. Still using frequent bronchodilators.    Aggressive bronchodilator therapy, IV steroids, inhalational steroids on hold.  Scheduled and as needed bronchodilators, deep breathing exercises, incentive spirometry, chest physiotherapy and respiratory therapy consult. Antibiotics due to severity of symptoms.  Doxycycline for 3/7 days. Supplemental oxygen to keep saturations more than 92%.  Smoker: Counseled to quit.  On nicotine patch.  Hypertension: Blood pressure stable.  Resume home medication including hydrochlorothiazide.  Lung cancer: Follows at [facility].  Currently in remission.  A CTA was negative for PE.   DVT prophylaxis: enoxaparin (LOVENOX) injection 40 mg Start: 10/06/20 0800 SCDs Start: 10/06/20 0615   Code Status: Full code Family Communication: None Disposition Plan: Status is: Inpatient.  Dispo: The patient is from: Home               Anticipated d/c is to: Home              Anticipated d/c date is: 1 day.              Patient currently is not medically stable to d/c.  Patient continues to have wheezing and shortness of breath.  He needs IV steroids and breathing treatments and needs to remain in the hospital.       Consultants:   None  Procedures:   None  Antimicrobials:   Doxycycline, 12/8---   Subjective: Patient seen and examined. Still feels short of breath however much improved than before.  Objective: Vitals:   10/07/20 1813 10/07/20 2048 10/08/20 0442 10/08/20 0815  BP:  126/75 (!) 155/92   Pulse:  71 91   Resp:  19 20   Temp:  98.1 F (36.7 C) 98.6 F (37 C)   TempSrc:   Oral   SpO2: 96% 98% 98% 98%  Weight:      Height:       No intake or output data in the 24 hours ending 10/08/20 1103 Filed Weights   10/06/20 0214 10/06/20 1457 10/06/20 1530  Weight: 100.3 kg 100.3 kg 100.3 kg    Examination:  General exam: Appears calm and comfortable  On 3 L of oxygen. Has some audible wheezing. Respiratory system: Scattered expiratory wheezes.  Mild increased work of breathing. Cardiovascular system: S1 & S2 heard, RRR. No JVD, murmurs, rubs, gallops or clicks. No pedal edema. Gastrointestinal system: Abdomen is nondistended, soft and nontender. No organomegaly or masses felt. Normal bowel sounds heard. Central nervous system: Alert and oriented. No focal neurological deficits. Extremities: Symmetric 5 x  5 power. Skin: No rashes, lesions or ulcers Psychiatry: Judgement and insight appear normal. Mood & affect appropriate.     Data Reviewed: I have personally reviewed following labs and imaging studies  CBC: Recent Labs  Lab 10/04/20 0816 10/06/20 0236 10/07/20 0503  WBC 8.5 14.6* 14.3*  NEUTROABS 5.7 9.5*  --   HGB 15.3 14.4 13.8  HCT 48.9 46.6 44.7  MCV 95.5 96.7 96.1  PLT 246 254 008   Basic Metabolic Panel: Recent Labs  Lab 10/04/20 0816 10/06/20 0236  10/06/20 0623 10/07/20 0503  NA 138 141  --  137  K 4.5 3.9  --  4.5  CL 98 98  --  96*  CO2 31 37*  --  34*  GLUCOSE 119* 119*  --  221*  BUN 6 12  --  11  CREATININE 0.96 1.28*  --  0.89  CALCIUM 8.7* 8.9  --  9.1  MG  --   --  1.9  --   PHOS  --   --  2.5  --    GFR: Estimated Creatinine Clearance: 99.1 mL/min (by C-G formula based on SCr of 0.89 mg/dL). Liver Function Tests: Recent Labs  Lab 10/07/20 0503  AST 13*  ALT 13  ALKPHOS 84  BILITOT 0.5  PROT 6.1*  ALBUMIN 3.3*   No results for input(s): LIPASE, AMYLASE in the last 168 hours. No results for input(s): AMMONIA in the last 168 hours. Coagulation Profile: Recent Labs  Lab 10/07/20 0503  INR 1.0   Cardiac Enzymes: No results for input(s): CKTOTAL, CKMB, CKMBINDEX, TROPONINI in the last 168 hours. BNP (last 3 results) No results for input(s): PROBNP in the last 8760 hours. HbA1C: No results for input(s): HGBA1C in the last 72 hours. CBG: Recent Labs  Lab 10/06/20 2152 10/07/20 0759 10/07/20 1130 10/07/20 2049  GLUCAP 115* 118* 139* 132*   Lipid Profile: No results for input(s): CHOL, HDL, LDLCALC, TRIG, CHOLHDL, LDLDIRECT in the last 72 hours. Thyroid Function Tests: No results for input(s): TSH, T4TOTAL, FREET4, T3FREE, THYROIDAB in the last 72 hours. Anemia Panel: No results for input(s): VITAMINB12, FOLATE, FERRITIN, TIBC, IRON, RETICCTPCT in the last 72 hours. Sepsis Labs: No results for input(s): PROCALCITON, LATICACIDVEN in the last 168 hours.  Recent Results (from the past 240 hour(s))  Resp Panel by RT-PCR (Flu A&B, Covid) Nasopharyngeal Swab     Status: None   Collection Time: 10/04/20  8:00 AM   Specimen: Nasopharyngeal Swab; Nasopharyngeal(NP) swabs in vial transport medium  Result Value Ref Range Status   SARS Coronavirus 2 by RT PCR NEGATIVE NEGATIVE Final    Comment: (NOTE) SARS-CoV-2 target nucleic acids are NOT DETECTED.  The SARS-CoV-2 RNA is generally detectable in upper  respiratory specimens during the acute phase of infection. The lowest concentration of SARS-CoV-2 viral copies this assay can detect is 138 copies/mL. A negative result does not preclude SARS-Cov-2 infection and should not be used as the sole basis for treatment or other patient management decisions. A negative result may occur with  improper specimen collection/handling, submission of specimen other than nasopharyngeal swab, presence of viral mutation(s) within the areas targeted by this assay, and inadequate number of viral copies(<138 copies/mL). A negative result must be combined with clinical observations, patient history, and epidemiological information. The expected result is Negative.  Fact Sheet for Patients:  EntrepreneurPulse.com.au  Fact Sheet for Healthcare Providers:  IncredibleEmployment.be  This test is no t yet approved or cleared by the [place] FDA  and  has been authorized for detection and/or diagnosis of SARS-CoV-2 by FDA under an Emergency Use Authorization (EUA). This EUA will remain  in effect (meaning this test can be used) for the duration of the COVID-19 declaration under Section 564(b)(1) of the Act, 21 U.S.C.section 360bbb-3(b)(1), unless the authorization is terminated  or revoked sooner.       Influenza A by PCR NEGATIVE NEGATIVE Final   Influenza B by PCR NEGATIVE NEGATIVE Final    Comment: (NOTE) The Xpert Xpress SARS-CoV-2/FLU/RSV plus assay is intended as an aid in the diagnosis of influenza from Nasopharyngeal swab specimens and should not be used as a sole basis for treatment. Nasal washings and aspirates are unacceptable for Xpert Xpress SARS-CoV-2/FLU/RSV testing.  Fact Sheet for Patients: EntrepreneurPulse.com.au  Fact Sheet for Healthcare Providers: IncredibleEmployment.be  This test is not yet approved or cleared by the [place] FDA and has been  authorized for detection and/or diagnosis of SARS-CoV-2 by FDA under an Emergency Use Authorization (EUA). This EUA will remain in effect (meaning this test can be used) for the duration of the COVID-19 declaration under Section 564(b)(1) of the Act, 21 U.S.C. section 360bbb-3(b)(1), unless the authorization is terminated or revoked.  Performed at [facility], [address] [place], [place] [address]          Radiology Studies: No results found.      Scheduled Meds: . dextromethorphan-guaiFENesin  1 tablet Oral BID  . doxycycline  100 mg Oral Q12H  . enoxaparin (LOVENOX) injection  40 mg Subcutaneous Q24H  . ipratropium-albuterol  3 mL Nebulization Once  . ipratropium-albuterol  3 mL Nebulization TID  . methylPREDNISolone (SOLU-MEDROL) injection  40 mg Intravenous Q12H  . nicotine  14 mg Transdermal Daily  . pantoprazole  40 mg Oral Daily   Continuous Infusions:   LOS: 1 day    Time spent: 30 minutes    Barb Merino, MD [facility] Pager [phone]

## 2020-10-08 NOTE — Plan of Care (Signed)

## 2020-10-08 NOTE — Care Management Important Message (Signed)
Important Message  Patient Details  Name: Miguel Marsh MRN: [id number] Date of Birth: 1961-04-16   Medicare Important Message Given:  Yes     Tommy Medal 10/08/2020, 4:01 PM

## 2020-10-09 NOTE — Progress Notes (Signed)
PROGRESS NOTE    Miguel Marsh  MEQ:[id number] DOB: [birth year] DOA: 10/06/2020 PCP: Adaline Sill, NP    Brief Narrative:  59 year old gentleman with history of non-small cell lung cancer currently in remission treated with Keytruda, chronic pain syndrome, anxiety, COPD on chronic hypoxic respiratory failure on 3 L of oxygen at home, still smoking presented back to the ER with ongoing shortness of breath and wheezing.  In the emergency room hemodynamically stable.  Intermittently tachypneic and tachycardic.  Initially treated with nonrebreather.  Chest x-ray with no active disease.  COVID-19 negative.   Assessment & Plan:   Principal Problem:   COPD with acute exacerbation ([place]) Active Problems:   Tobacco use disorder   Essential hypertension   Non-small cell cancer of right lung (in remission)   GERD (gastroesophageal reflux disease)   Acute on chronic respiratory failure with hypoxia ([facility])   Leukocytosis  COPD with acute exacerbation: Acute on chronic hypoxic respiratory failure. Patient still has wheezing and congestion. Still using frequent bronchodilators.    Continue with bronchodilator therapy and IV steroids.  Scheduled and as needed bronchodilators, deep breathing exercises, incentive spirometry, chest physiotherapy and respiratory therapy consult. Antibiotics due to severity of symptoms.  Doxycycline for 4/7 days. Supplemental oxygen to keep saturations more than 92%.  Smoker: Counseled to quit.  On nicotine patch.  Hypertension: Blood pressure stable.  Resume home medication including hydrochlorothiazide.  Lung cancer: Follows at [facility]  Currently in remission.  CTA was negative for PE.  Patient continues to have wheezing.  Continue treatment in the hospital.   DVT prophylaxis: enoxaparin (LOVENOX) injection 40 mg Start: 10/06/20 0800 SCDs Start: 10/06/20 0615   Code Status: Full code Family Communication: None Disposition Plan: Status is:  Inpatient.  Dispo: The patient is from: Home              Anticipated d/c is to: Home              Anticipated d/c date is: 1 day.  Anticipate tomorrow.              Patient currently is not medically stable to d/c.  Patient continues to have wheezing and shortness of breath.  He needs IV steroids and breathing treatments and needs to remain in the hospital.       Consultants:   None  Procedures:   None  Antimicrobials:   Doxycycline, 12/8---   Subjective: Seen and examined.  No overnight events.  Breathing slightly better.  On mobility, became anxious and started wheezing and feels very nervous about getting out of the hospital and wants to avoid coming back to the hospital.  Objective: Vitals:   10/08/20 2022 10/08/20 2035 10/09/20 0445 10/09/20 0713  BP: 136/74  (!) 143/88   Pulse: 97  66   Resp: 18  18   Temp: 98 F (36.7 C)  98 F (36.7 C)   TempSrc: Oral  Oral   SpO2: 95% 97% 98% 99%  Weight:      Height:        Intake/Output Summary (Last 24 hours) at 10/09/2020 1113 Last data filed at 10/09/2020 0446 Gross per 24 hour  Intake 930 ml  Output --  Net 930 ml   Filed Weights   10/06/20 0214 10/06/20 1457 10/06/20 1530  Weight: 100.3 kg 100.3 kg 100.3 kg    Examination:  General exam: Appears calm and comfortable at rest. On 3 L of oxygen on mobility. Respiratory system: Bilateral clear.  Occasional expiratory wheezes.  Tachypneic on mobility. Cardiovascular system: S1 & S2 heard, RRR. No JVD, murmurs, rubs, gallops or clicks. No pedal edema. Gastrointestinal system: Abdomen is nondistended, soft and nontender. No organomegaly or masses felt. Normal bowel sounds heard. Central nervous system: Alert and oriented. No focal neurological deficits. Extremities: Symmetric 5 x 5 power. Skin: No rashes, lesions or ulcers Psychiatry: Judgement and insight appear normal. Mood & affect appropriate.     Data Reviewed: I have personally reviewed following  labs and imaging studies  CBC: Recent Labs  Lab 10/04/20 0816 10/06/20 0236 10/07/20 0503  WBC 8.5 14.6* 14.3*  NEUTROABS 5.7 9.5*  --   HGB 15.3 14.4 13.8  HCT 48.9 46.6 44.7  MCV 95.5 96.7 96.1  PLT 246 254 161   Basic Metabolic Panel: Recent Labs  Lab 10/04/20 0816 10/06/20 0236 10/06/20 0623 10/07/20 0503  NA 138 141  --  137  K 4.5 3.9  --  4.5  CL 98 98  --  96*  CO2 31 37*  --  34*  GLUCOSE 119* 119*  --  221*  BUN 6 12  --  11  CREATININE 0.96 1.28*  --  0.89  CALCIUM 8.7* 8.9  --  9.1  MG  --   --  1.9  --   PHOS  --   --  2.5  --    GFR: Estimated Creatinine Clearance: 99.1 mL/min (by C-G formula based on SCr of 0.89 mg/dL). Liver Function Tests: Recent Labs  Lab 10/07/20 0503  AST 13*  ALT 13  ALKPHOS 84  BILITOT 0.5  PROT 6.1*  ALBUMIN 3.3*   No results for input(s): LIPASE, AMYLASE in the last 168 hours. No results for input(s): AMMONIA in the last 168 hours. Coagulation Profile: Recent Labs  Lab 10/07/20 0503  INR 1.0   Cardiac Enzymes: No results for input(s): CKTOTAL, CKMB, CKMBINDEX, TROPONINI in the last 168 hours. BNP (last 3 results) No results for input(s): PROBNP in the last 8760 hours. HbA1C: No results for input(s): HGBA1C in the last 72 hours. CBG: Recent Labs  Lab 10/07/20 0759 10/07/20 1130 10/07/20 2049 10/08/20 1153 10/08/20 2051  GLUCAP 118* 139* 132* 162* 204*   Lipid Profile: No results for input(s): CHOL, HDL, LDLCALC, TRIG, CHOLHDL, LDLDIRECT in the last 72 hours. Thyroid Function Tests: No results for input(s): TSH, T4TOTAL, FREET4, T3FREE, THYROIDAB in the last 72 hours. Anemia Panel: No results for input(s): VITAMINB12, FOLATE, FERRITIN, TIBC, IRON, RETICCTPCT in the last 72 hours. Sepsis Labs: No results for input(s): PROCALCITON, LATICACIDVEN in the last 168 hours.  Recent Results (from the past 240 hour(s))  Resp Panel by RT-PCR (Flu A&B, Covid) Nasopharyngeal Swab     Status: None   Collection  Time: 10/04/20  8:00 AM   Specimen: Nasopharyngeal Swab; Nasopharyngeal(NP) swabs in vial transport medium  Result Value Ref Range Status   SARS Coronavirus 2 by RT PCR NEGATIVE NEGATIVE Final    Comment: (NOTE) SARS-CoV-2 target nucleic acids are NOT DETECTED.  The SARS-CoV-2 RNA is generally detectable in upper respiratory specimens during the acute phase of infection. The lowest concentration of SARS-CoV-2 viral copies this assay can detect is 138 copies/mL. A negative result does not preclude SARS-Cov-2 infection and should not be used as the sole basis for treatment or other patient management decisions. A negative result may occur with  improper specimen collection/handling, submission of specimen other than nasopharyngeal swab, presence of viral mutation(s) within the areas targeted by  this assay, and inadequate number of viral copies(<138 copies/mL). A negative result must be combined with clinical observations, patient history, and epidemiological information. The expected result is Negative.  Fact Sheet for Patients:  EntrepreneurPulse.com.au  Fact Sheet for Healthcare Providers:  IncredibleEmployment.be  This test is no t yet approved or cleared by the [place] FDA and  has been authorized for detection and/or diagnosis of SARS-CoV-2 by FDA under an Emergency Use Authorization (EUA). This EUA will remain  in effect (meaning this test can be used) for the duration of the COVID-19 declaration under Section 564(b)(1) of the Act, 21 U.S.C.section 360bbb-3(b)(1), unless the authorization is terminated  or revoked sooner.       Influenza A by PCR NEGATIVE NEGATIVE Final   Influenza B by PCR NEGATIVE NEGATIVE Final    Comment: (NOTE) The Xpert Xpress SARS-CoV-2/FLU/RSV plus assay is intended as an aid in the diagnosis of influenza from Nasopharyngeal swab specimens and should not be used as a sole basis for treatment. Nasal washings  and aspirates are unacceptable for Xpert Xpress SARS-CoV-2/FLU/RSV testing.  Fact Sheet for Patients: EntrepreneurPulse.com.au  Fact Sheet for Healthcare Providers: IncredibleEmployment.be  This test is not yet approved or cleared by the [place] FDA and has been authorized for detection and/or diagnosis of SARS-CoV-2 by FDA under an Emergency Use Authorization (EUA). This EUA will remain in effect (meaning this test can be used) for the duration of the COVID-19 declaration under Section 564(b)(1) of the Act, 21 U.S.C. section 360bbb-3(b)(1), unless the authorization is terminated or revoked.  Performed at [facility], [address] [place], [place] [address]          Radiology Studies: No results found.      Scheduled Meds: . dextromethorphan-guaiFENesin  1 tablet Oral BID  . doxycycline  100 mg Oral Q12H  . enoxaparin (LOVENOX) injection  40 mg Subcutaneous Q24H  . ipratropium-albuterol  3 mL Nebulization Once  . ipratropium-albuterol  3 mL Nebulization TID  . methylPREDNISolone (SOLU-MEDROL) injection  40 mg Intravenous Q12H  . nicotine  14 mg Transdermal Daily  . pantoprazole  40 mg Oral Daily   Continuous Infusions:   LOS: 2 days    Time spent: 30 minutes    Barb Merino, MD [facility] Pager [phone]

## 2020-10-09 NOTE — Plan of Care (Signed)

## 2020-10-10 MED ORDER — PREDNISONE 10 MG PO TABS
ORAL_TABLET | ORAL | 0 refills | Status: DC
Start: 2020-10-10 — End: 2021-05-13

## 2020-10-10 NOTE — Plan of Care (Signed)

## 2020-10-10 NOTE — Progress Notes (Signed)
Patient is chronically on 3 liters of O2 at home. Patient does not have portable O2 with him, friend is suppose to pick pt up. Pt stated he does not know if he can take the ride home wo O2, I educated pt on safety of getting O2, and we may need to have a tank delivered to the hospital before DC pt. Pt does not remember name or number of oxygen company. Nunzio Cory made aware.

## 2020-10-10 NOTE — Discharge Summary (Signed)
Physician Discharge Summary  MITHRAN STRIKE DXI:[id number] DOB: 16-Aug-1961 DOA: 10/06/2020  PCP: Adaline Sill, NP  Admit date: 10/06/2020 Discharge date: 10/10/2020  Admitted From: home  Disposition:  Home   Recommendations for Outpatient Follow-up:  1. Follow up with PCP in 1-2 weeks 2. Schedule follow up with your lung doctor   Home Health:NA  Equipment/Devices:NA   Discharge Condition:stable   CODE STATUS:full code  Diet recommendation: low salt diet, quit smoking   Discharge Summary: 59 year old gentleman with history of non-small cell lung cancer currently in remission treated with Keytruda, chronic pain syndrome, anxiety, COPD on chronic hypoxic respiratory failure on 3 L of oxygen at home, still smoking presented back to the ER with ongoing shortness of breath and wheezing.  In the emergency room hemodynamically stable.  Intermittently tachypneic and tachycardic.  Initially treated with nonrebreather.  Chest x-ray with no active disease.  COVID-19 negative. CTA negative for PE or consolidation.   COPD exacerbation: treated with aggressive therapy with bronchodilators / iv steroids and antibiotics for 5 days with good clinical response. Still has SOB on movement however at his base line .  - dc home with prolonged prednisone taper  - resume home doses of Trellegy and nebs - smoking cessation  - follow up with his pulmonary clinic as scheduled. - resume all long term medication  - has oxygen at home   Stable to go home today    Discharge Diagnoses:  Principal Problem:   COPD with acute exacerbation ([place]) Active Problems:   Tobacco use disorder   Essential hypertension   Non-small cell cancer of right lung (in remission)   GERD (gastroesophageal reflux disease)   Acute on chronic respiratory failure with hypoxia (HCC)   Leukocytosis    Discharge Instructions  Discharge Instructions    Call MD for:  difficulty breathing, headache or visual disturbances    Complete by: As directed    Diet - low sodium heart healthy   Complete by: As directed    Discharge instructions   Complete by: As directed    Try your best not to smoke or expose yourself to smoke  Use nicotine supplements   Increase activity slowly   Complete by: As directed      Allergies as of 10/10/2020   No Known Allergies     Medication List    TAKE these medications   albuterol 108 (90 Base) MCG/ACT inhaler Commonly known as: VENTOLIN HFA Inhale 2 puffs into the lungs every 6 (six) hours as needed for wheezing.   albuterol (2.5 MG/3ML) 0.083% nebulizer solution Commonly known as: PROVENTIL Inhale 2.5 mg into the lungs every 6 (six) hours as needed for wheezing or shortness of breath.   hydrochlorothiazide 25 MG tablet Commonly known as: HYDRODIURIL Take 25 mg by mouth every morning.   ipratropium-albuterol 0.5-2.5 (3) MG/3ML Soln Commonly known as: DUONEB Take 3 mLs by nebulization every 4 (four) hours as needed. What changed: reasons to take this   nicotine 14 mg/24hr patch Commonly known as: NICODERM CQ - dosed in mg/24 hours Place 1 patch (14 mg total) onto the skin daily.   oxyCODONE 15 MG immediate release tablet Commonly known as: ROXICODONE Take 15 mg by mouth 4 (four) times daily as needed for pain.   OXYGEN Inhale 3 L into the lungs continuous.   predniSONE 10 MG tablet Commonly known as: DELTASONE 4 tablet for 3 days 3 tablet for 3 days 2 tablet for 3 days 1 tablet for 3  days What changed:   how much to take  how to take this  when to take this  additional instructions   Trelegy Ellipta 100-62.5-25 MCG/INH Aepb Generic drug: Fluticasone-Umeclidin-Vilant Inhale 1 puff into the lungs daily.       No Known Allergies  Consultations:  None    Procedures/Studies: CT ANGIO CHEST PE W OR WO CONTRAST  Result Date: 10/06/2020 CLINICAL DATA:  Lung cancer suspected. Recurrent shortness of breath. EXAM: CT ANGIOGRAPHY CHEST WITH  CONTRAST TECHNIQUE: Multidetector CT imaging of the chest was performed using the standard protocol during bolus administration of intravenous contrast. Multiplanar CT image reconstructions and MIPs were obtained to evaluate the vascular anatomy. CONTRAST:  127mL OMNIPAQUE IOHEXOL 350 MG/ML SOLN COMPARISON:  07/16/2018 FINDINGS: Cardiovascular: Satisfactory opacification of the pulmonary arteries to the segmental level. No evidence of pulmonary embolism when accounting for levels of streak artifact. Normal heart size. No pericardial effusion. Coronary atherosclerotic calcifications. Mediastinum/Nodes: Negative for adenopathy or mass Lungs/Pleura: Chronic airway thickening with clustered nodularity at the right base where a dominant lobulated nodule measures up to 1 cm. Mild scarring at the lingula. Upper Abdomen: Partially covered gallbladder and cholelithiasis. The stomach is distended by a recent meal. Left upper pole cystic density. Musculoskeletal: No acute or aggressive finding Review of the MIP images confirms the above findings. IMPRESSION: 1. Negative for pulmonary embolism or other acute finding. 2. Chronic airway thickening and right lower lobe nodularity, the latter mildly progressed from 2019. The patient had a surveillance chest CT at [facility] 07/15/2020. 3. Coronary atherosclerosis. Electronically Signed   By: Monte Fantasia M.D.   On: 10/06/2020 10:05   DG Chest Portable 1 View  Result Date: 10/06/2020 CLINICAL DATA:  Shortness of breath EXAM: PORTABLE CHEST 1 VIEW COMPARISON:  October 04, 2020 FINDINGS: The heart size and mediastinal contours are within normal limits. Again noted are chronic mildly increased interstitial markings and bronchial wall thickening throughout both lungs. No large airspace consolidation or pleural effusion. The visualized skeletal structures are unremarkable. IMPRESSION: No active disease. Electronically Signed   By: Prudencio Pair M.D.   On: 10/06/2020 03:02   DG  Chest Portable 1 View  Result Date: 10/04/2020 CLINICAL DATA:  Shortness of breath. EXAM: PORTABLE CHEST 1 VIEW COMPARISON:  08/29/2020 FINDINGS: Stable top-normal heart size. Stable chronic lung disease with interstitial and bronchial prominence as well as probable emphysematous component. Stable scattered pulmonary scarring bilaterally. There is no evidence of pulmonary edema, consolidation, pneumothorax or pleural fluid. Visualized bony structures are unremarkable. IMPRESSION: No active disease. Electronically Signed   By: Aletta Edouard M.D.   On: 10/04/2020 08:19    (Echo, Carotid, EGD, Colonoscopy, ERCP)    Subjective: seen and examined. No overnight events . Occasionally gets anxiety attacks. Ready to go home    Discharge Exam: Vitals:   10/10/20 0509 10/10/20 0741  BP: 127/81   Pulse: 69   Resp: 18   Temp:    SpO2: 98% 100%   Vitals:   10/09/20 2008 10/09/20 2148 10/10/20 0509 10/10/20 0741  BP:  122/75 127/81   Pulse:  72 69   Resp:  18 18   Temp:  98.2 F (36.8 C)    TempSrc:  Oral    SpO2: 96% 99% 98% 100%  Weight:      Height:        General: Pt is alert, awake, not in acute distress Cardiovascular: RRR, S1/S2 +, no rubs, no gallops Respiratory: CTA bilaterally, no wheezing, no  rhonchi Mild increased respiratory distress on walking  Abdominal: Soft, NT, ND, bowel sounds + Extremities: no edema, no cyanosis    The results of significant diagnostics from this hospitalization (including imaging, microbiology, ancillary and laboratory) are listed below for reference.     Microbiology: Recent Results (from the past 240 hour(s))  Resp Panel by RT-PCR (Flu A&B, Covid) Nasopharyngeal Swab     Status: None   Collection Time: 10/04/20  8:00 AM   Specimen: Nasopharyngeal Swab; Nasopharyngeal(NP) swabs in vial transport medium  Result Value Ref Range Status   SARS Coronavirus 2 by RT PCR NEGATIVE NEGATIVE Final    Comment: (NOTE) SARS-CoV-2 target nucleic acids  are NOT DETECTED.  The SARS-CoV-2 RNA is generally detectable in upper respiratory specimens during the acute phase of infection. The lowest concentration of SARS-CoV-2 viral copies this assay can detect is 138 copies/mL. A negative result does not preclude SARS-Cov-2 infection and should not be used as the sole basis for treatment or other patient management decisions. A negative result may occur with  improper specimen collection/handling, submission of specimen other than nasopharyngeal swab, presence of viral mutation(s) within the areas targeted by this assay, and inadequate number of viral copies(<138 copies/mL). A negative result must be combined with clinical observations, patient history, and epidemiological information. The expected result is Negative.  Fact Sheet for Patients:  EntrepreneurPulse.com.au  Fact Sheet for Healthcare Providers:  IncredibleEmployment.be  This test is no t yet approved or cleared by the [place] FDA and  has been authorized for detection and/or diagnosis of SARS-CoV-2 by FDA under an Emergency Use Authorization (EUA). This EUA will remain  in effect (meaning this test can be used) for the duration of the COVID-19 declaration under Section 564(b)(1) of the Act, 21 U.S.C.section 360bbb-3(b)(1), unless the authorization is terminated  or revoked sooner.       Influenza A by PCR NEGATIVE NEGATIVE Final   Influenza B by PCR NEGATIVE NEGATIVE Final    Comment: (NOTE) The Xpert Xpress SARS-CoV-2/FLU/RSV plus assay is intended as an aid in the diagnosis of influenza from Nasopharyngeal swab specimens and should not be used as a sole basis for treatment. Nasal washings and aspirates are unacceptable for Xpert Xpress SARS-CoV-2/FLU/RSV testing.  Fact Sheet for Patients: EntrepreneurPulse.com.au  Fact Sheet for Healthcare Providers: IncredibleEmployment.be  This test is  not yet approved or cleared by the [place] FDA and has been authorized for detection and/or diagnosis of SARS-CoV-2 by FDA under an Emergency Use Authorization (EUA). This EUA will remain in effect (meaning this test can be used) for the duration of the COVID-19 declaration under Section 564(b)(1) of the Act, 21 U.S.C. section 360bbb-3(b)(1), unless the authorization is terminated or revoked.  Performed at [facility], [address], [place], [place] [address]      Labs: BNP (last 3 results) Recent Labs    12/11/19 1613 04/12/20 0932 10/04/20 0859  BNP 32.0 33.0 185.6*   Basic Metabolic Panel: Recent Labs  Lab 10/04/20 0816 10/06/20 0236 10/06/20 0623 10/07/20 0503  NA 138 141  --  137  K 4.5 3.9  --  4.5  CL 98 98  --  96*  CO2 31 37*  --  34*  GLUCOSE 119* 119*  --  221*  BUN 6 12  --  11  CREATININE 0.96 1.28*  --  0.89  CALCIUM 8.7* 8.9  --  9.1  MG  --   --  1.9  --   PHOS  --   --  2.5  --    Liver Function Tests: Recent Labs  Lab 10/07/20 0503  AST 13*  ALT 13  ALKPHOS 84  BILITOT 0.5  PROT 6.1*  ALBUMIN 3.3*   No results for input(s): LIPASE, AMYLASE in the last 168 hours. No results for input(s): AMMONIA in the last 168 hours. CBC: Recent Labs  Lab 10/04/20 0816 10/06/20 0236 10/07/20 0503  WBC 8.5 14.6* 14.3*  NEUTROABS 5.7 9.5*  --   HGB 15.3 14.4 13.8  HCT 48.9 46.6 44.7  MCV 95.5 96.7 96.1  PLT 246 254 249   Cardiac Enzymes: No results for input(s): CKTOTAL, CKMB, CKMBINDEX, TROPONINI in the last 168 hours. BNP: Invalid input(s): POCBNP CBG: Recent Labs  Lab 10/07/20 0759 10/07/20 1130 10/07/20 2049 10/08/20 1153 10/08/20 2051  GLUCAP 118* 139* 132* 162* 204*   D-Dimer No results for input(s): DDIMER in the last 72 hours. Hgb A1c No results for input(s): HGBA1C in the last 72 hours. Lipid Profile No results for input(s): CHOL, HDL, LDLCALC, TRIG, CHOLHDL, LDLDIRECT in the last 72 hours. Thyroid function  studies No results for input(s): TSH, T4TOTAL, T3FREE, THYROIDAB in the last 72 hours.  Invalid input(s): FREET3 Anemia work up No results for input(s): VITAMINB12, FOLATE, FERRITIN, TIBC, IRON, RETICCTPCT in the last 72 hours. Urinalysis    Component Value Date/Time   COLORURINE YELLOW 01/22/2020 1251   APPEARANCEUR CLEAR 01/22/2020 1251   LABSPEC 1.017 01/22/2020 1251   PHURINE 6.0 01/22/2020 1251   GLUCOSEU NEGATIVE 01/22/2020 1251   HGBUR NEGATIVE 01/22/2020 1251   BILIRUBINUR NEGATIVE 01/22/2020 1251   KETONESUR NEGATIVE 01/22/2020 1251   PROTEINUR NEGATIVE 01/22/2020 1251   UROBILINOGEN 0.2 10/21/2013 0200   NITRITE NEGATIVE 01/22/2020 1251   LEUKOCYTESUR NEGATIVE 01/22/2020 1251   Sepsis Labs Invalid input(s): PROCALCITONIN,  WBC,  LACTICIDVEN Microbiology Recent Results (from the past 240 hour(s))  Resp Panel by RT-PCR (Flu A&B, Covid) Nasopharyngeal Swab     Status: None   Collection Time: 10/04/20  8:00 AM   Specimen: Nasopharyngeal Swab; Nasopharyngeal(NP) swabs in vial transport medium  Result Value Ref Range Status   SARS Coronavirus 2 by RT PCR NEGATIVE NEGATIVE Final    Comment: (NOTE) SARS-CoV-2 target nucleic acids are NOT DETECTED.  The SARS-CoV-2 RNA is generally detectable in upper respiratory specimens during the acute phase of infection. The lowest concentration of SARS-CoV-2 viral copies this assay can detect is 138 copies/mL. A negative result does not preclude SARS-Cov-2 infection and should not be used as the sole basis for treatment or other patient management decisions. A negative result may occur with  improper specimen collection/handling, submission of specimen other than nasopharyngeal swab, presence of viral mutation(s) within the areas targeted by this assay, and inadequate number of viral copies(<138 copies/mL). A negative result must be combined with clinical observations, patient history, and epidemiological information. The expected  result is Negative.  Fact Sheet for Patients:  EntrepreneurPulse.com.au  Fact Sheet for Healthcare Providers:  IncredibleEmployment.be  This test is no t yet approved or cleared by the [place] FDA and  has been authorized for detection and/or diagnosis of SARS-CoV-2 by FDA under an Emergency Use Authorization (EUA). This EUA will remain  in effect (meaning this test can be used) for the duration of the COVID-19 declaration under Section 564(b)(1) of the Act, 21 U.S.C.section 360bbb-3(b)(1), unless the authorization is terminated  or revoked sooner.       Influenza A by PCR NEGATIVE NEGATIVE Final   Influenza B by  PCR NEGATIVE NEGATIVE Final    Comment: (NOTE) The Xpert Xpress SARS-CoV-2/FLU/RSV plus assay is intended as an aid in the diagnosis of influenza from Nasopharyngeal swab specimens and should not be used as a sole basis for treatment. Nasal washings and aspirates are unacceptable for Xpert Xpress SARS-CoV-2/FLU/RSV testing.  Fact Sheet for Patients: EntrepreneurPulse.com.au  Fact Sheet for Healthcare Providers: IncredibleEmployment.be  This test is not yet approved or cleared by the [place] FDA and has been authorized for detection and/or diagnosis of SARS-CoV-2 by FDA under an Emergency Use Authorization (EUA). This EUA will remain in effect (meaning this test can be used) for the duration of the COVID-19 declaration under Section 564(b)(1) of the Act, 21 U.S.C. section 360bbb-3(b)(1), unless the authorization is terminated or revoked.  Performed at [facility], [address], [place], [place] [address]      Time coordinating discharge:  32 minutes  SIGNED:   Barb Merino, MD  [facility] 10/10/2020, 8:34 AM

## 2020-10-10 NOTE — TOC Progression Note (Signed)
TOC notified of pt's need for transport home with O2. Ultimately, EMS transport was arranged. There were no other TOC needs.

## 2020-10-10 NOTE — Progress Notes (Signed)
[facility] transportation set up.

## 2020-10-10 NOTE — Plan of Care (Signed)
  Problem: Education: Goal: Knowledge of General Education information will improve Description: Including pain rating scale, medication(s)/side effects and non-pharmacologic comfort measures 10/10/2020 1011 by Cameron Ali, RN Outcome: Completed/Met 10/10/2020 0746 by Cameron Ali, RN Outcome: Progressing   Problem: Health Behavior/Discharge Planning: Goal: Ability to manage health-related needs will improve 10/10/2020 1011 by Cameron Ali, RN Outcome: Completed/Met 10/10/2020 0746 by Cameron Ali, RN Outcome: Progressing   Problem: Clinical Measurements: Goal: Ability to maintain clinical measurements within normal limits will improve 10/10/2020 1011 by Cameron Ali, RN Outcome: Completed/Met 10/10/2020 0746 by Cameron Ali, RN Outcome: Progressing Goal: Will remain free from infection 10/10/2020 1011 by Cameron Ali, RN Outcome: Completed/Met 10/10/2020 0746 by Cameron Ali, RN Outcome: Progressing Goal: Diagnostic test results will improve 10/10/2020 1011 by Cameron Ali, RN Outcome: Completed/Met 10/10/2020 0746 by Cameron Ali, RN Outcome: Progressing Goal: Respiratory complications will improve 10/10/2020 1011 by Cameron Ali, RN Outcome: Completed/Met 10/10/2020 0746 by Cameron Ali, RN Outcome: Progressing Goal: Cardiovascular complication will be avoided 10/10/2020 1011 by Cameron Ali, RN Outcome: Completed/Met 10/10/2020 0746 by Cameron Ali, RN Outcome: Progressing   Problem: Activity: Goal: Risk for activity intolerance will decrease 10/10/2020 1011 by Cameron Ali, RN Outcome: Completed/Met 10/10/2020 0746 by Cameron Ali, RN Outcome: Progressing   Problem: Nutrition: Goal: Adequate nutrition will be maintained 10/10/2020 1011 by Cameron Ali, RN Outcome: Completed/Met 10/10/2020 0746 by Cameron Ali, RN Outcome: Progressing   Problem: Coping: Goal: Level  of anxiety will decrease 10/10/2020 1011 by Cameron Ali, RN Outcome: Completed/Met 10/10/2020 0746 by Cameron Ali, RN Outcome: Progressing   Problem: Elimination: Goal: Will not experience complications related to bowel motility 10/10/2020 1011 by Cameron Ali, RN Outcome: Completed/Met 10/10/2020 0746 by Cameron Ali, RN Outcome: Progressing Goal: Will not experience complications related to urinary retention 10/10/2020 1011 by Cameron Ali, RN Outcome: Completed/Met 10/10/2020 0746 by Cameron Ali, RN Outcome: Progressing   Problem: Pain Managment: Goal: General experience of comfort will improve 10/10/2020 1011 by Cameron Ali, RN Outcome: Completed/Met 10/10/2020 0746 by Cameron Ali, RN Outcome: Progressing   Problem: Safety: Goal: Ability to remain free from injury will improve 10/10/2020 1011 by Cameron Ali, RN Outcome: Completed/Met 10/10/2020 0746 by Cameron Ali, RN Outcome: Progressing   Problem: Skin Integrity: Goal: Risk for impaired skin integrity will decrease 10/10/2020 1011 by Cameron Ali, RN Outcome: Completed/Met 10/10/2020 0746 by Cameron Ali, RN Outcome: Progressing

## 2020-10-25 ENCOUNTER — Institutional Professional Consult (permissible substitution): Payer: Medicare HMO | Admitting: Internal Medicine

## 2020-10-25 NOTE — Progress Notes (Deleted)
Miguel Marsh, male    DOB: [birth year], 59 y.o.   MRN: [id number]   Brief patient profile:     Admit date: 10/06/2020 Discharge date: 10/10/2020  Admitted From: home  Disposition:  Home   Recommendations for Outpatient Follow-up:  1. Follow up with PCP in 1-2 weeks 2. Schedule follow up with your lung doctor   Home Health:NA  Equipment/Devices:NA   Discharge Condition:stable   CODE STATUS:full code  Diet recommendation: low salt diet, quit smoking   Discharge Summary: 59 year old gentleman with history of non-small cell lung cancer currently in remission treated with Keytruda, chronic pain syndrome, anxiety, COPD on chronic hypoxic respiratory failure on 3 L of oxygen at home, still smoking presented back to the ER with ongoing shortness of breath and wheezing. In the emergency room hemodynamically stable. Intermittently tachypneic and tachycardic. Initially treated with nonrebreather. Chest x-ray with no active disease. COVID-19 negative. CTA negative for PE or consolidation.   COPD exacerbation: treated with aggressive therapy with bronchodilators / iv steroids and antibiotics for 5 days with good clinical response. Still has SOB on movement however at his base line .  - dc home with prolonged prednisone taper  - resume home doses of Trellegy and nebs - smoking cessation  - follow up with his pulmonary clinic as scheduled. - resume all long term medication  - has oxygen at home   Stable to go home today    Discharge Diagnoses:  Principal Problem:   COPD with acute exacerbation ([place]) Active Problems:   Tobacco use disorder   Essential hypertension   Non-small cell cancer of right lung (in remission)   GERD (gastroesophageal reflux disease)   Acute on chronic respiratory failure with hypoxia ([facility])   Leukocytosis    Discharge Instructions      Discharge Instructions    Call MD for:  difficulty breathing, headache or visual disturbances    Complete by: As directed    Diet - low sodium heart healthy   Complete by: As directed    Discharge instructions   Complete by: As directed    Try your best not to smoke or expose yourself to smoke  Use nicotine supplements   Increase activity slowly   Complete by: As directed      Allergies as of 10/10/2020   No Known Allergies        Medication List    TAKE these medications   albuterol 108 (90 Base) MCG/ACT inhaler Commonly known as: VENTOLIN HFA Inhale 2 puffs into the lungs every 6 (six) hours as needed for wheezing.   albuterol (2.5 MG/3ML) 0.083% nebulizer solution Commonly known as: PROVENTIL Inhale 2.5 mg into the lungs every 6 (six) hours as needed for wheezing or shortness of breath.   hydrochlorothiazide 25 MG tablet Commonly known as: HYDRODIURIL Take 25 mg by mouth every morning.   ipratropium-albuterol 0.5-2.5 (3) MG/3ML Soln Commonly known as: DUONEB Take 3 mLs by nebulization every 4 (four) hours as needed. What changed: reasons to take this   nicotine 14 mg/24hr patch Commonly known as: NICODERM CQ - dosed in mg/24 hours Place 1 patch (14 mg total) onto the skin daily.   oxyCODONE 15 MG immediate release tablet Commonly known as: ROXICODONE Take 15 mg by mouth 4 (four) times daily as needed for pain.   OXYGEN Inhale 3 L into the lungs continuous.   predniSONE 10 MG tablet Commonly known as: DELTASONE 4 tablet for 3 days 3 tablet for 3 days 2  tablet for 3 days 1 tablet for 3 days What changed:   how much to take  how to take this  when to take this  additional instructions   Trelegy Ellipta 100-62.5-25 MCG/INH Aepb Generic drug: Fluticasone-Umeclidin-Vilant Inhale 1 puff into the lungs daily.      No Known Allergies  Consultations:  None       History of Present Illness  10/25/2020  Pulmonary/ 1st office eval/ Natale Barba / [place] [facility]  No chief complaint on file.    Dyspnea:  *** Cough:  *** Sleep: *** SABA use:   Past Medical History:  Diagnosis Date  . Arthritis   . Bronchitis   . Cancer [facility])    metastatic NSCLC (followed by [facility])  . COPD (chronic obstructive pulmonary disease) ([place])   . HTN (hypertension)     Outpatient Medications Prior to Visit  Medication Sig Dispense Refill  . albuterol (PROVENTIL HFA;VENTOLIN HFA) 108 (90 Base) MCG/ACT inhaler Inhale 2 puffs into the lungs every 6 (six) hours as needed for wheezing. 1 Inhaler 1  . albuterol (PROVENTIL) (2.5 MG/3ML) 0.083% nebulizer solution Inhale 2.5 mg into the lungs every 6 (six) hours as needed for wheezing or shortness of breath.     . Fluticasone-Umeclidin-Vilant (TRELEGY ELLIPTA) 100-62.5-25 MCG/INH AEPB Inhale 1 puff into the lungs daily.    . hydrochlorothiazide (HYDRODIURIL) 25 MG tablet Take 25 mg by mouth every morning.    Marland Kitchen ipratropium-albuterol (DUONEB) 0.5-2.5 (3) MG/3ML SOLN Take 3 mLs by nebulization every 4 (four) hours as needed. (Patient taking differently: Take 3 mLs by nebulization every 4 (four) hours as needed (shortness of breath/wheezing). ) 360 mL 1  . nicotine (NICODERM CQ - DOSED IN MG/24 HOURS) 14 mg/24hr patch Place 1 patch (14 mg total) onto the skin daily. 28 patch 0  . oxyCODONE (ROXICODONE) 15 MG immediate release tablet Take 15 mg by mouth 4 (four) times daily as needed for pain.     . OXYGEN Inhale 3 L into the lungs continuous.     . predniSONE (DELTASONE) 10 MG tablet 4 tablet for 3 days 3 tablet for 3 days 2 tablet for 3 days 1 tablet for 3 days 30 tablet 0   No facility-administered medications prior to visit.     Objective:     There were no vitals taken for this visit.         Assessment   No problem-specific Assessment & Plan notes found for this encounter.     Christinia Gully, MD 10/25/2020

## 2020-11-11 ENCOUNTER — Institutional Professional Consult (permissible substitution): Payer: Medicare HMO | Admitting: Internal Medicine

## 2021-05-12 ENCOUNTER — Emergency Department (HOSPITAL_COMMUNITY): Payer: Medicare Other

## 2021-05-12 ENCOUNTER — Inpatient Hospital Stay (HOSPITAL_COMMUNITY)
Admission: EM | Admit: 2021-05-12 | Discharge: 2021-05-15 | DRG: 190 | Disposition: A | Discharge status: Home / Self Care | Payer: Medicare Other | Attending: Internal Medicine | Admitting: Internal Medicine

## 2021-05-12 ENCOUNTER — Encounter (HOSPITAL_COMMUNITY): Payer: Self-pay

## 2021-05-12 ENCOUNTER — Other Ambulatory Visit: Payer: Self-pay

## 2021-05-12 DIAGNOSIS — I10 Essential (primary) hypertension: Secondary | ICD-10-CM | POA: Diagnosis present

## 2021-05-12 DIAGNOSIS — F419 Anxiety disorder, unspecified: Secondary | ICD-10-CM | POA: Diagnosis present

## 2021-05-12 DIAGNOSIS — Z7951 Long term (current) use of inhaled steroids: Secondary | ICD-10-CM

## 2021-05-12 DIAGNOSIS — J441 Chronic obstructive pulmonary disease with (acute) exacerbation: Principal | ICD-10-CM | POA: Diagnosis present

## 2021-05-12 DIAGNOSIS — F17200 Nicotine dependence, unspecified, uncomplicated: Secondary | ICD-10-CM | POA: Diagnosis present

## 2021-05-12 DIAGNOSIS — Z23 Encounter for immunization: Secondary | ICD-10-CM

## 2021-05-12 DIAGNOSIS — M1990 Unspecified osteoarthritis, unspecified site: Secondary | ICD-10-CM | POA: Diagnosis present

## 2021-05-12 DIAGNOSIS — Z85118 Personal history of other malignant neoplasm of bronchus and lung: Secondary | ICD-10-CM

## 2021-05-12 DIAGNOSIS — Z713 Dietary counseling and surveillance: Secondary | ICD-10-CM

## 2021-05-12 DIAGNOSIS — R7303 Prediabetes: Secondary | ICD-10-CM | POA: Diagnosis present

## 2021-05-12 DIAGNOSIS — R0902 Hypoxemia: Secondary | ICD-10-CM

## 2021-05-12 DIAGNOSIS — J9621 Acute and chronic respiratory failure with hypoxia: Secondary | ICD-10-CM | POA: Diagnosis present

## 2021-05-12 DIAGNOSIS — K219 Gastro-esophageal reflux disease without esophagitis: Secondary | ICD-10-CM | POA: Diagnosis present

## 2021-05-12 DIAGNOSIS — R739 Hyperglycemia, unspecified: Secondary | ICD-10-CM | POA: Diagnosis present

## 2021-05-12 DIAGNOSIS — E669 Obesity, unspecified: Secondary | ICD-10-CM | POA: Diagnosis present

## 2021-05-12 DIAGNOSIS — Z20822 Contact with and (suspected) exposure to covid-19: Secondary | ICD-10-CM | POA: Diagnosis present

## 2021-05-12 DIAGNOSIS — Z720 Tobacco use: Secondary | ICD-10-CM

## 2021-05-12 DIAGNOSIS — Z716 Tobacco abuse counseling: Secondary | ICD-10-CM

## 2021-05-12 DIAGNOSIS — Z79899 Other long term (current) drug therapy: Secondary | ICD-10-CM

## 2021-05-12 DIAGNOSIS — Z6835 Body mass index (BMI) 35.0-35.9, adult: Secondary | ICD-10-CM

## 2021-05-12 DIAGNOSIS — Z9981 Dependence on supplemental oxygen: Secondary | ICD-10-CM

## 2021-05-12 LAB — CBC WITH DIFFERENTIAL/PLATELET
Abs Immature Granulocytes: 0.01 10*3/uL (ref 0.00–0.07)
Basophils Absolute: 0 10*3/uL (ref 0.0–0.1)
Basophils Relative: 0 %
Eosinophils Absolute: 0.1 10*3/uL (ref 0.0–0.5)
Eosinophils Relative: 1 %
HCT: 49.7 % (ref 39.0–52.0)
Hemoglobin: 15.1 g/dL (ref 13.0–17.0)
Immature Granulocytes: 0 %
Lymphocytes Relative: 32 %
Lymphs Abs: 2.8 10*3/uL (ref 0.7–4.0)
MCH: 29.6 pg (ref 26.0–34.0)
MCHC: 30.4 g/dL (ref 30.0–36.0)
MCV: 97.5 fL (ref 80.0–100.0)
Monocytes Absolute: 0.9 10*3/uL (ref 0.1–1.0)
Monocytes Relative: 11 %
Neutro Abs: 4.9 10*3/uL (ref 1.7–7.7)
Neutrophils Relative %: 56 %
Platelets: 201 10*3/uL (ref 150–400)
RBC: 5.1 MIL/uL (ref 4.22–5.81)
RDW: 11.9 % (ref 11.5–15.5)
WBC: 8.7 10*3/uL (ref 4.0–10.5)
nRBC: 0 % (ref 0.0–0.2)

## 2021-05-12 LAB — BASIC METABOLIC PANEL
Anion gap: 6 (ref 5–15)
BUN: 10 mg/dL (ref 6–20)
CO2: 39 mmol/L — ABNORMAL HIGH (ref 22–32)
Calcium: 8.4 mg/dL — ABNORMAL LOW (ref 8.9–10.3)
Chloride: 96 mmol/L — ABNORMAL LOW (ref 98–111)
Creatinine, Ser: 0.77 mg/dL (ref 0.61–1.24)
GFR, Estimated: >60 mL/min (ref >60)
Glucose, Bld: 189 mg/dL — ABNORMAL HIGH (ref 70–99)
Potassium: 3.9 mmol/L (ref 3.5–5.1)
Sodium: 141 mmol/L (ref 135–145)

## 2021-05-12 LAB — RESP PANEL BY RT-PCR (FLU A&B, COVID) ARPGX2
Influenza A by PCR: NEGATIVE
Influenza B by PCR: NEGATIVE
SARS Coronavirus 2 by RT PCR: NEGATIVE

## 2021-05-12 MED ORDER — IPRATROPIUM-ALBUTEROL 0.5-2.5 (3) MG/3ML IN SOLN: 3.0000 mL | RESPIRATORY_TRACT | Status: DC

## 2021-05-12 MED ORDER — ALBUTEROL SULFATE (2.5 MG/3ML) 0.083% IN NEBU
2.5000 mg | INHALATION_SOLUTION | RESPIRATORY_TRACT | Status: AC
  Administered 2021-05-12: 2.5 mg via RESPIRATORY_TRACT
  Filled 2021-05-12: qty 3

## 2021-05-12 MED ORDER — ALBUTEROL (5 MG/ML) CONTINUOUS INHALATION SOLN: 10.0000 mg/h | INHALATION_SOLUTION | Freq: Once | RESPIRATORY_TRACT | Status: DC

## 2021-05-12 MED ORDER — METHYLPREDNISOLONE SODIUM SUCC 125 MG IJ SOLR
60.0000 mg | Freq: Once | INTRAMUSCULAR | Status: AC
  Administered 2021-05-12: 60 mg via INTRAVENOUS
  Filled 2021-05-12: qty 2

## 2021-05-12 NOTE — ED Triage Notes (Signed)
BIB [facility] c/o SOB x 1 day, hx of COPD. 125  solu medrol, 2.5 albuterol given PTA.

## 2021-05-12 NOTE — ED Provider Notes (Signed)
Care assumed from Dr. Almyra Free, patient with COPD and negative chest x-ray negative labs and negative COVID screen pending response to albuterol nebulizer treatments.  Following nebulizer treatment, patient has noted no change.  Oxygen saturation is 91-93% on 5 L of oxygen (his home oxygen is at 3 L).  On reexam, there is still decreased air movement and faint expiratory wheezes.  He will be given additional albuterol with ipratropium.  There was no change with second nebulizer treatment.  A third nebulizer treatment has been ordered.  There was no change with a third nebulizer treatment.  He will need to be admitted.  He continues to have oxygen saturations of 91% in spite of supplemental oxygen.  Case is discussed with Dr. Josephine Cables of [facility], who agrees to admit the patient.   Delora Fuel, MD 24/11/46 [phone]

## 2021-05-12 NOTE — ED Provider Notes (Signed)
[facility] EMERGENCY DEPARTMENT Provider Note   CSN: [id number] Arrival date & time: 05/12/21  2055     History Chief Complaint  Patient presents with   Shortness of Breath    Miguel Marsh is a 60 y.o. male.  Patient presents with chief complaint of shortness of breath.  Symptoms ongoing for 3 to 4 days worse in the last 24 hours.  He has a history of COPD and states it feels very similar to his COPD exacerbations in the past.  He had persistent cough unchanged.  Denies fevers. denies chest pain denies headache denies vomiting or diarrhea.  At baseline he is on 2 to 3 L nasal cannula continuously at home.  He has had to bump it up to 4-5 recently.      Past Medical History:  Diagnosis Date   Arthritis    Bronchitis    Cancer ([place])    metastatic NSCLC (followed by [facility])   COPD (chronic obstructive pulmonary disease) ([place])    HTN (hypertension)     Patient Active Problem List   Diagnosis Date Noted   Leukocytosis 10/06/2020   Goals of care, counseling/discussion    Palliative care by specialist    DNR (do not resuscitate) discussion    Hyperglycemia 04/12/2020   Respiratory failure ([place]) 01/22/2020   Acute respiratory failure ([place]) 01/23/2018   Acute on chronic respiratory failure with hypoxia and hypercapnia ([place]) 01/23/2018   Acute on chronic respiratory failure with hypoxia ([place]) 01/01/2018   Obesity, Class III, BMI 40-49.9 (morbid obesity) ([place]) 01/01/2018   COPD with exacerbation ([place]) 01/01/2018   Essential hypertension 12/09/2015   Non-small cell cancer of right lung (in remission) 12/09/2015   GERD (gastroesophageal reflux disease) 12/09/2015   COPD with acute exacerbation ([place]) 12/17/2014   Tobacco use disorder 12/17/2014   COPD exacerbation ([place]) 12/17/2014   Obesity 12/17/2014   Dyspnea    Difficulty in walking(719.7) 04/02/2013   Hip weakness 04/02/2013    Past Surgical History:  Procedure Laterality Date   LUNG BIOPSY     TOTAL HIP ARTHROPLASTY      TUMOR REMOVAL Right 12/04/2017       History reviewed. No pertinent family history.  Social History   Tobacco Use   Smoking status: Former    Packs/day: 0.10    Types: Cigarettes   Smokeless tobacco: Never  Substance Use Topics   Alcohol use: Yes    Comment: occ   Drug use: No    Types: Marijuana, Cocaine    Comment: former    Home Medications Prior to Admission medications   Medication Sig Start Date End Date Taking? Authorizing Provider  albuterol (PROVENTIL HFA;VENTOLIN HFA) 108 (90 Base) MCG/ACT inhaler Inhale 2 puffs into the lungs every 6 (six) hours as needed for wheezing. 07/17/18  Yes Roxan Hockey, MD  albuterol (PROVENTIL) (2.5 MG/3ML) 0.083% nebulizer solution Inhale 2.5 mg into the lungs every 6 (six) hours as needed for wheezing or shortness of breath.  01/19/20  Yes [provider]  ALPRAZolam (XANAX) 0.5 MG tablet Take 0.5 mg by mouth 3 (three) times daily as needed. 04/25/21  Yes [provider]  Fluticasone-Umeclidin-Vilant (TRELEGY ELLIPTA) 100-62.5-25 MCG/INH AEPB Inhale 1 puff into the lungs daily.   Yes [provider]  hydrochlorothiazide (HYDRODIURIL) 25 MG tablet Take 25 mg by mouth every morning. 07/02/20  Yes [provider]  ipratropium-albuterol (DUONEB) 0.5-2.5 (3) MG/3ML SOLN Take 3 mLs by nebulization every 4 (four) hours as needed. Patient taking  differently: Take 3 mLs by nebulization every 4 (four) hours as needed (shortness of breath/wheezing). 12/16/19  Yes Tat, Shanon Brow, MD  omeprazole (PRILOSEC) 40 MG capsule Take 1 capsule by mouth daily. 11/06/16  Yes [provider]  oxyCODONE (ROXICODONE) 15 MG immediate release tablet Take 15 mg by mouth 4 (four) times daily as needed for pain.  08/13/20  Yes [provider]  OXYGEN Inhale 3 L into the lungs continuous.    Yes [provider]  nicotine (NICODERM CQ - DOSED IN MG/24 HOURS) 14 mg/24hr patch Place 1 patch (14 mg total) onto the skin  daily. Patient not taking: Reported on 05/12/2021 09/03/20   Heath Lark D, DO  predniSONE (DELTASONE) 10 MG tablet 4 tablet for 3 days 3 tablet for 3 days 2 tablet for 3 days 1 tablet for 3 days Patient not taking: No sig reported 10/10/20   Barb Merino, MD    Allergies    Patient has no known allergies.  Review of Systems   Review of Systems  Constitutional:  Negative for fever.  HENT:  Negative for ear pain and sore throat.   Eyes:  Negative for pain.  Respiratory:  Positive for shortness of breath.   Cardiovascular:  Negative for chest pain.  Gastrointestinal:  Negative for abdominal pain.  Genitourinary:  Negative for flank pain.  Musculoskeletal:  Negative for back pain.  Skin:  Negative for color change and rash.  Neurological:  Negative for syncope.  All other systems reviewed and are negative.  Physical Exam Updated Vital Signs BP 125/78   Pulse 82   Temp 98.1 F (36.7 C) (Oral)   Resp (!) 29   Ht 5\' 6"  (1.676 m)   Wt 100.3 kg   SpO2 92%   BMI 35.69 kg/m   Physical Exam Constitutional:      General: He is not in acute distress.    Appearance: He is well-developed.  HENT:     Head: Normocephalic.     Nose: Nose normal.  Eyes:     Extraocular Movements: Extraocular movements intact.  Cardiovascular:     Rate and Rhythm: Normal rate.  Pulmonary:     Effort: Respiratory distress present.     Breath sounds: Wheezing present.  Skin:    Coloration: Skin is not jaundiced.  Neurological:     Mental Status: He is alert. Mental status is at baseline.    ED Results / Procedures / Treatments   Labs (all labs ordered are listed, but only abnormal results are displayed) Labs Reviewed  BASIC METABOLIC PANEL - Abnormal; Notable for the following components:      Result Value   Chloride 96 (*)    CO2 39 (*)    Glucose, Bld 189 (*)    Calcium 8.4 (*)    All other components within normal limits  RESP PANEL BY RT-PCR (FLU A&B, COVID) ARPGX2  CBC WITH  DIFFERENTIAL/PLATELET    EKG None  Radiology DG Chest Port 1 View  Result Date: 05/12/2021 CLINICAL DATA:  Shortness of breath EXAM: PORTABLE CHEST 1 VIEW COMPARISON:  10/06/2020 FINDINGS: Cardiac shadow is within normal limits. No focal infiltrate or sizable effusion is seen. Some minimal scarring in the left base is noted. No bony abnormality is seen. IMPRESSION: No acute abnormality noted. Electronically Signed   By: Inez Catalina M.D.   On: 05/12/2021 22:25    Procedures .Critical Care  Date/Time: 05/12/2021 10:55 PM Performed by: Luna Fuse, MD Authorized by: Almyra Free,  Greggory Brandy, MD   Critical care provider statement:    Critical care time (minutes):  40   Critical care time was exclusive of:  Separately billable procedures and treating other patients   Critical care was necessary to treat or prevent imminent or life-threatening deterioration of the following conditions:  Respiratory failure Comments:     Severe COPD exacerbation resulting in hypoxemia requiring continuous albuterol treatment.   Medications Ordered in ED Medications  albuterol (PROVENTIL) (2.5 MG/3ML) 0.083% nebulizer solution 2.5 mg (2.5 mg Nebulization New Bag/Given 05/12/21 2250)  methylPREDNISolone sodium succinate (SOLU-MEDROL) 125 mg/2 mL injection 60 mg (60 mg Intravenous Given 05/12/21 2228)    ED Course  I have reviewed the triage vital signs and the nursing notes.  Pertinent labs & imaging results that were available during my care of the patient were reviewed by me and considered in my medical decision making (see chart for details).    MDM Rules/Calculators/A&P                          Patient presents tachypneic and diffuse wheezes with diminished air movement bilaterally.  Labs and chest x-ray are unremarkable.  Patient given continuous albuterol, will require reassessment.   Final Clinical Impression(s) / ED Diagnoses Final diagnoses:  COPD exacerbation [facility])    Rx / DC Orders ED  Discharge Orders     None        Luna Fuse, MD 05/12/21 2256

## 2021-05-13 DIAGNOSIS — Z85118 Personal history of other malignant neoplasm of bronchus and lung: Secondary | ICD-10-CM | POA: Diagnosis not present

## 2021-05-13 DIAGNOSIS — J9621 Acute and chronic respiratory failure with hypoxia: Secondary | ICD-10-CM

## 2021-05-13 DIAGNOSIS — Z79899 Other long term (current) drug therapy: Secondary | ICD-10-CM | POA: Diagnosis not present

## 2021-05-13 DIAGNOSIS — Z20822 Contact with and (suspected) exposure to covid-19: Secondary | ICD-10-CM | POA: Diagnosis present

## 2021-05-13 DIAGNOSIS — F17200 Nicotine dependence, unspecified, uncomplicated: Secondary | ICD-10-CM

## 2021-05-13 DIAGNOSIS — Z7951 Long term (current) use of inhaled steroids: Secondary | ICD-10-CM | POA: Diagnosis not present

## 2021-05-13 DIAGNOSIS — I10 Essential (primary) hypertension: Secondary | ICD-10-CM | POA: Diagnosis present

## 2021-05-13 DIAGNOSIS — Z9981 Dependence on supplemental oxygen: Secondary | ICD-10-CM | POA: Diagnosis not present

## 2021-05-13 DIAGNOSIS — Z23 Encounter for immunization: Secondary | ICD-10-CM | POA: Diagnosis not present

## 2021-05-13 DIAGNOSIS — J441 Chronic obstructive pulmonary disease with (acute) exacerbation: Principal | ICD-10-CM

## 2021-05-13 DIAGNOSIS — R7303 Prediabetes: Secondary | ICD-10-CM | POA: Diagnosis present

## 2021-05-13 DIAGNOSIS — R0902 Hypoxemia: Secondary | ICD-10-CM | POA: Diagnosis present

## 2021-05-13 DIAGNOSIS — Z6835 Body mass index (BMI) 35.0-35.9, adult: Secondary | ICD-10-CM | POA: Diagnosis not present

## 2021-05-13 DIAGNOSIS — E669 Obesity, unspecified: Secondary | ICD-10-CM | POA: Diagnosis present

## 2021-05-13 DIAGNOSIS — R739 Hyperglycemia, unspecified: Secondary | ICD-10-CM | POA: Diagnosis present

## 2021-05-13 DIAGNOSIS — K219 Gastro-esophageal reflux disease without esophagitis: Secondary | ICD-10-CM | POA: Diagnosis present

## 2021-05-13 DIAGNOSIS — M1990 Unspecified osteoarthritis, unspecified site: Secondary | ICD-10-CM | POA: Diagnosis present

## 2021-05-13 DIAGNOSIS — Z713 Dietary counseling and surveillance: Secondary | ICD-10-CM | POA: Diagnosis not present

## 2021-05-13 DIAGNOSIS — Z716 Tobacco abuse counseling: Secondary | ICD-10-CM | POA: Diagnosis not present

## 2021-05-13 DIAGNOSIS — F419 Anxiety disorder, unspecified: Secondary | ICD-10-CM | POA: Diagnosis present

## 2021-05-13 DIAGNOSIS — Z720 Tobacco use: Secondary | ICD-10-CM | POA: Diagnosis not present

## 2021-05-13 LAB — GLUCOSE, CAPILLARY
Glucose-Capillary: 103 mg/dL — ABNORMAL HIGH (ref 70–99)
Glucose-Capillary: 163 mg/dL — ABNORMAL HIGH (ref 70–99)

## 2021-05-13 LAB — CBC
HCT: 48.6 % (ref 39.0–52.0)
Hemoglobin: 14.5 g/dL (ref 13.0–17.0)
MCH: 29.2 pg (ref 26.0–34.0)
MCHC: 29.8 g/dL — ABNORMAL LOW (ref 30.0–36.0)
MCV: 98 fL (ref 80.0–100.0)
Platelets: 194 10*3/uL (ref 150–400)
RBC: 4.96 MIL/uL (ref 4.22–5.81)
RDW: 11.9 % (ref 11.5–15.5)
WBC: 8.1 10*3/uL (ref 4.0–10.5)
nRBC: 0 % (ref 0.0–0.2)

## 2021-05-13 LAB — APTT: aPTT: 25 seconds (ref 24–36)

## 2021-05-13 LAB — HEMOGLOBIN A1C
Hgb A1c MFr Bld: 6 % — ABNORMAL HIGH (ref 4.8–5.6)
Mean Plasma Glucose: 125.5 mg/dL

## 2021-05-13 LAB — COMPREHENSIVE METABOLIC PANEL
ALT: 14 U/L (ref 0–44)
AST: 16 U/L (ref 15–41)
Albumin: 3.5 g/dL (ref 3.5–5.0)
Alkaline Phosphatase: 80 U/L (ref 38–126)
Anion gap: 8 (ref 5–15)
BUN: 12 mg/dL (ref 6–20)
CO2: 37 mmol/L — ABNORMAL HIGH (ref 22–32)
Calcium: 9 mg/dL (ref 8.9–10.3)
Chloride: 95 mmol/L — ABNORMAL LOW (ref 98–111)
Creatinine, Ser: 0.9 mg/dL (ref 0.61–1.24)
GFR, Estimated: >60 mL/min (ref >60)
Glucose, Bld: 199 mg/dL — ABNORMAL HIGH (ref 70–99)
Potassium: 4.6 mmol/L (ref 3.5–5.1)
Sodium: 140 mmol/L (ref 135–145)
Total Bilirubin: 0.4 mg/dL (ref 0.3–1.2)
Total Protein: 7 g/dL (ref 6.5–8.1)

## 2021-05-13 LAB — PROTIME-INR
INR: 1.1 (ref 0.8–1.2)
Prothrombin Time: 13.7 seconds (ref 11.4–15.2)

## 2021-05-13 LAB — CBG MONITORING, ED
Glucose-Capillary: 165 mg/dL — ABNORMAL HIGH (ref 70–99)
Glucose-Capillary: 134 mg/dL — ABNORMAL HIGH (ref 70–99)

## 2021-05-13 LAB — MAGNESIUM: Magnesium: 1.9 mg/dL (ref 1.7–2.4)

## 2021-05-13 LAB — PHOSPHORUS: Phosphorus: 2.3 mg/dL — ABNORMAL LOW (ref 2.5–4.6)

## 2021-05-13 MED ORDER — ALPRAZOLAM 0.5 MG PO TABS
0.5000 mg | ORAL_TABLET | Freq: Three times a day (TID) | ORAL | Status: DC | PRN
  Administered 2021-05-13 – 2021-05-14 (×2): 0.5 mg via ORAL
  Filled 2021-05-13 (×2): qty 1

## 2021-05-13 MED ORDER — METHYLPREDNISOLONE SODIUM SUCC 40 MG IJ SOLR
40.0000 mg | Freq: Two times a day (BID) | INTRAMUSCULAR | Status: DC
  Administered 2021-05-13 – 2021-05-15 (×5): 40 mg via INTRAVENOUS
  Filled 2021-05-13 (×5): qty 1

## 2021-05-13 MED ORDER — IPRATROPIUM-ALBUTEROL 0.5-2.5 (3) MG/3ML IN SOLN
3.0000 mL | Freq: Once | RESPIRATORY_TRACT | Status: AC
  Administered 2021-05-13: 3 mL via RESPIRATORY_TRACT
  Filled 2021-05-13: qty 3

## 2021-05-13 MED ORDER — OXYCODONE HCL 5 MG PO TABS
15.0000 mg | ORAL_TABLET | Freq: Four times a day (QID) | ORAL | Status: DC | PRN
  Administered 2021-05-13 – 2021-05-15 (×5): 15 mg via ORAL
  Filled 2021-05-13 (×5): qty 3

## 2021-05-13 MED ORDER — INSULIN ASPART 100 UNIT/ML IJ SOLN: 0.0000 [IU] | Freq: Every day | INTRAMUSCULAR | Status: DC

## 2021-05-13 MED ORDER — FLUTICASONE FUROATE-VILANTEROL 100-25 MCG/INH IN AEPB
1.0000 | INHALATION_SPRAY | Freq: Every day | RESPIRATORY_TRACT | Status: DC
  Administered 2021-05-14 – 2021-05-15 (×2): 1 via RESPIRATORY_TRACT
  Filled 2021-05-13: qty 28

## 2021-05-13 MED ORDER — PANTOPRAZOLE SODIUM 40 MG PO TBEC: 40.0000 mg | DELAYED_RELEASE_TABLET | Freq: Every day | ORAL | Status: DC

## 2021-05-13 MED ORDER — FLUTICASONE-UMECLIDIN-VILANT 100-62.5-25 MCG/INH IN AEPB: 1.0000 | INHALATION_SPRAY | Freq: Every day | RESPIRATORY_TRACT | Status: DC

## 2021-05-13 MED ORDER — ENOXAPARIN SODIUM 40 MG/0.4ML IJ SOSY
40.0000 mg | PREFILLED_SYRINGE | INTRAMUSCULAR | Status: DC
  Administered 2021-05-13 – 2021-05-15 (×3): 40 mg via SUBCUTANEOUS
  Filled 2021-05-13 (×3): qty 0.4

## 2021-05-13 MED ORDER — UMECLIDINIUM BROMIDE 62.5 MCG/INH IN AEPB
1.0000 | INHALATION_SPRAY | Freq: Every day | RESPIRATORY_TRACT | Status: DC
  Administered 2021-05-14 – 2021-05-15 (×2): 1 via RESPIRATORY_TRACT
  Filled 2021-05-13: qty 7

## 2021-05-13 MED ORDER — INSULIN ASPART 100 UNIT/ML IJ SOLN
0.0000 [IU] | Freq: Three times a day (TID) | INTRAMUSCULAR | Status: DC
  Administered 2021-05-13: 3 [IU] via SUBCUTANEOUS
  Administered 2021-05-14: 2 [IU] via SUBCUTANEOUS
  Administered 2021-05-14: 3 [IU] via SUBCUTANEOUS
  Filled 2021-05-13: qty 1

## 2021-05-13 MED ORDER — COVID-19 MRNA VACC (MODERNA) 50 MCG/0.25ML IM SUSP
0.2500 mL | Freq: Once | INTRAMUSCULAR | Status: AC
  Administered 2021-05-14: 0.25 mL via INTRAMUSCULAR
  Filled 2021-05-13: qty 0.25

## 2021-05-13 MED ORDER — AZITHROMYCIN 250 MG PO TABS
500.0000 mg | ORAL_TABLET | Freq: Every day | ORAL | Status: AC
  Administered 2021-05-13: 500 mg via ORAL
  Filled 2021-05-13: qty 2

## 2021-05-13 MED ORDER — IPRATROPIUM-ALBUTEROL 0.5-2.5 (3) MG/3ML IN SOLN
3.0000 mL | Freq: Four times a day (QID) | RESPIRATORY_TRACT | Status: DC
  Administered 2021-05-13 – 2021-05-15 (×8): 3 mL via RESPIRATORY_TRACT
  Filled 2021-05-13 (×6): qty 3

## 2021-05-13 MED ORDER — DM-GUAIFENESIN ER 30-600 MG PO TB12
1.0000 | ORAL_TABLET | Freq: Two times a day (BID) | ORAL | Status: DC
  Administered 2021-05-13 – 2021-05-15 (×5): 1 via ORAL
  Filled 2021-05-13 (×5): qty 1

## 2021-05-13 MED ORDER — AZITHROMYCIN 250 MG PO TABS
250.0000 mg | ORAL_TABLET | Freq: Every day | ORAL | Status: DC
  Administered 2021-05-14 – 2021-05-15 (×2): 250 mg via ORAL
  Filled 2021-05-13 (×2): qty 1

## 2021-05-13 MED ORDER — NICOTINE 14 MG/24HR TD PT24
14.0000 mg | MEDICATED_PATCH | Freq: Every day | TRANSDERMAL | Status: DC
  Filled 2021-05-13 (×2): qty 1

## 2021-05-13 MED ORDER — HYDROCHLOROTHIAZIDE 25 MG PO TABS
25.0000 mg | ORAL_TABLET | Freq: Every morning | ORAL | Status: DC
  Administered 2021-05-14 – 2021-05-15 (×2): 25 mg via ORAL
  Filled 2021-05-13 (×2): qty 1

## 2021-05-13 MED ORDER — PANTOPRAZOLE SODIUM 40 MG PO TBEC
80.0000 mg | DELAYED_RELEASE_TABLET | Freq: Every day | ORAL | Status: DC
  Administered 2021-05-13 – 2021-05-15 (×3): 80 mg via ORAL
  Filled 2021-05-13 (×3): qty 2

## 2021-05-13 MED ORDER — IPRATROPIUM-ALBUTEROL 0.5-2.5 (3) MG/3ML IN SOLN: 3.0000 mL | RESPIRATORY_TRACT | Status: DC | PRN

## 2021-05-13 NOTE — H&P (Signed)
History and Physical  Miguel Marsh QQP:[id number] DOB: Mar 25, 1961 DOA: 05/12/2021  Referring physician: Delora Fuel, MD PCP: Adaline Sill, NP  Patient coming from: Home  Chief Complaint: Shortness of breath  HPI: Miguel Marsh is a 60 y.o. male with medical history significant for COPD on 3 LPM of oxygen at home, GERD, hypertension who presents to the emergency department due to 2-3-day onset of worsening shortness of breath which rapidly got worse yesterday.  He endorsed chronic cough which has not changed in nature.  Patient states that he used home medication without improvement and he has since increased his oxygen supplementation to 5 LPM.  Patient decided to go to the ED for further evaluation and management.  He denies fever, chills, chest pain, nausea or vomiting.  ED Course:  In the emergency department, he was intermittently tachypneic, other vital signs were within normal range.  O2 sat ranged from 91% to 100% via [place] at 5 LPM.  Work-up in the ED showed normal CBC and BMP except for hyperglycemia.  Influenza A, B and SARS coronavirus 2 was negative.  Chest x-ray showed no acute abnormality.  Breathing treatment was provided, Solu-Medrol was given.  Hospitalist was asked to admit patient for further evaluation and management.  Review of Systems: Constitutional: Negative for chills and fever.  HENT: Negative for ear pain and sore throat.   Eyes: Negative for pain and visual disturbance.  Respiratory: Positive for cough and shortness of breath.   Cardiovascular: Negative for chest pain and palpitations.  Gastrointestinal: Negative for abdominal pain and vomiting.  Endocrine: Negative for polyphagia and polyuria.  Genitourinary: Negative for decreased urine volume, dysuria, enuresis Musculoskeletal: Negative for arthralgias and back pain.  Skin: Negative for color change and rash.  Allergic/Immunologic: Negative for immunocompromised state.  Neurological: Negative for  tremors, syncope, speech difficulty, weakness, light-headedness and headaches.  Hematological: Does not bruise/bleed easily.  All other systems reviewed and are negative   Past Medical History:  Diagnosis Date   Arthritis    Bronchitis    Cancer ([place])    metastatic NSCLC (followed by [facility])   COPD (chronic obstructive pulmonary disease) ([place])    HTN (hypertension)    Past Surgical History:  Procedure Laterality Date   LUNG BIOPSY     TOTAL HIP ARTHROPLASTY     TUMOR REMOVAL Right 12/04/2017    Social History:  reports that he has quit smoking. His smoking use included cigarettes. He smoked an average of .1 packs per day. He has never used smokeless tobacco. He reports current alcohol use. He reports that he does not use drugs.   No Known Allergies  History reviewed. No pertinent family history.   Prior to Admission medications   Medication Sig Start Date End Date Taking? Authorizing Provider  albuterol (PROVENTIL HFA;VENTOLIN HFA) 108 (90 Base) MCG/ACT inhaler Inhale 2 puffs into the lungs every 6 (six) hours as needed for wheezing. 07/17/18  Yes Roxan Hockey, MD  albuterol (PROVENTIL) (2.5 MG/3ML) 0.083% nebulizer solution Inhale 2.5 mg into the lungs every 6 (six) hours as needed for wheezing or shortness of breath.  01/19/20  Yes [provider]  ALPRAZolam (XANAX) 0.5 MG tablet Take 0.5 mg by mouth 3 (three) times daily as needed. 04/25/21  Yes [provider]  Fluticasone-Umeclidin-Vilant (TRELEGY ELLIPTA) 100-62.5-25 MCG/INH AEPB Inhale 1 puff into the lungs daily.   Yes [provider]  hydrochlorothiazide (HYDRODIURIL) 25 MG tablet Take 25 mg by mouth every morning. 07/02/20  Yes  [provider]  ipratropium-albuterol (DUONEB) 0.5-2.5 (3) MG/3ML SOLN Take 3 mLs by nebulization every 4 (four) hours as needed. Patient taking differently: Take 3 mLs by nebulization every 4 (four) hours as needed (shortness of breath/wheezing). 12/16/19  Yes Tat,  Shanon Brow, MD  omeprazole (PRILOSEC) 40 MG capsule Take 1 capsule by mouth daily. 11/06/16  Yes [provider]  oxyCODONE (ROXICODONE) 15 MG immediate release tablet Take 15 mg by mouth 4 (four) times daily as needed for pain.  08/13/20  Yes [provider]  OXYGEN Inhale 3 L into the lungs continuous.    Yes [provider]  nicotine (NICODERM CQ - DOSED IN MG/24 HOURS) 14 mg/24hr patch Place 1 patch (14 mg total) onto the skin daily. Patient not taking: Reported on 05/12/2021 09/03/20   Heath Lark D, DO  predniSONE (DELTASONE) 10 MG tablet 4 tablet for 3 days 3 tablet for 3 days 2 tablet for 3 days 1 tablet for 3 days Patient not taking: No sig reported 10/10/20   Barb Merino, MD    Physical Exam: BP (!) 114/57   Pulse 80   Temp 98.1 F (36.7 C) (Oral)   Resp 20   Ht 5\' 6"  (1.676 m)   Wt 100.3 kg   SpO2 92%   BMI 35.69 kg/m   General: 60 y.o. year-old male well developed well nourished in no acute distress.  Alert and oriented x3. HEENT: NCAT, EOMI Neck: Supple, trachea medial Cardiovascular: Regular rate and rhythm with no rubs or gallops.  No thyromegaly or JVD noted.  No lower extremity edema. 2/4 pulses in all 4 extremities. Respiratory: Decreased breath sounds in lower lobes and diffuse expiratory wheezing (upper lobes  > lower lobes) on  auscultation. Abdomen: Soft, nontender nondistended with normal bowel sounds x4 quadrants. Muskuloskeletal: No cyanosis, clubbing or edema noted bilaterally Neuro: CN II-XII intact, strength 5/5 x 4, sensation, reflexes intact Skin: No ulcerative lesions noted or rashes Psychiatry: Judgement and insight appear normal. Mood is appropriate for condition and setting          Labs on Admission:  Basic Metabolic Panel: Recent Labs  Lab 05/12/21 2104  NA 141  K 3.9  CL 96*  CO2 39*  GLUCOSE 189*  BUN 10  CREATININE 0.77  CALCIUM 8.4*   Liver Function Tests: No results for input(s): AST, ALT, ALKPHOS,  BILITOT, PROT, ALBUMIN in the last 168 hours. No results for input(s): LIPASE, AMYLASE in the last 168 hours. No results for input(s): AMMONIA in the last 168 hours. CBC: Recent Labs  Lab 05/12/21 2104  WBC 8.7  NEUTROABS 4.9  HGB 15.1  HCT 49.7  MCV 97.5  PLT 201   Cardiac Enzymes: No results for input(s): CKTOTAL, CKMB, CKMBINDEX, TROPONINI in the last 168 hours.  BNP (last 3 results) Recent Labs    10/04/20 0859  BNP 212.0*    ProBNP (last 3 results) No results for input(s): PROBNP in the last 8760 hours.  CBG: No results for input(s): GLUCAP in the last 168 hours.  Radiological Exams on Admission: DG Chest Port 1 View  Result Date: 05/12/2021 CLINICAL DATA:  Shortness of breath EXAM: PORTABLE CHEST 1 VIEW COMPARISON:  10/06/2020 FINDINGS: Cardiac shadow is within normal limits. No focal infiltrate or sizable effusion is seen. Some minimal scarring in the left base is noted. No bony abnormality is seen. IMPRESSION: No acute abnormality noted. Electronically Signed   By: Inez Catalina M.D.   On: 05/12/2021 22:25  EKG: I independently viewed the EKG done and my findings are as followed: EKG was not done in the ED.  Assessment/Plan Present on Admission:  COPD with acute exacerbation (HCC)  Acute on chronic respiratory failure with hypoxia (HCC)  GERD (gastroesophageal reflux disease)  Hyperglycemia  Essential hypertension  Obesity  Tobacco use disorder  Principal Problem:   COPD with acute exacerbation (HCC) Active Problems:   Tobacco use disorder   Obesity   Essential hypertension   GERD (gastroesophageal reflux disease)   Acute on chronic respiratory failure with hypoxia (HCC)   Hyperglycemia  Acute on chronic respiratory failure with hypoxia secondary to COPD with acute exacerbation Continue duo nebs, Mucinex, Solu-Medrol, azithromycin. Continue Protonix to prevent steroid-induced ulcer Continue incentive spirometry and flutter valve Continue  supplemental oxygen to maintain O2 sat > 92% with plan to wean patient down to home oxygen level as tolerated  Hyperglycemia (chronic) Hemoglobin A1c done in December 2021 was 6.4, no antidiabetic medication will was noted in patient's med rec  Continue ISS and hypoglycemic protocol  GERD Continue Protonix  Essential hypertension Continue HCTZ per home regimen  Tobacco use Patient was counseled on tobacco abuse cessation Patient states that he only smokes occasionally and last tobacco use was several days ago. Continue nicotine patch  Obesity (BMI 35.69) Patient was counseled on diet and lifestyle modification  DVT prophylaxis: Lovenox  Code Status: Full code  Family Communication: None at bedside  Disposition Plan:  Patient is from:                        home Anticipated DC to:                   SNF or family members home Anticipated DC date:               2-3 days Anticipated DC barriers:          Patient requires inpatient management due to persistent shortness of breath due to COPD exacerbation requiring further inpatient treatment    Consults called: None  Admission status: Observation    Bernadette Hoit MD [facility]  05/13/2021, 5:13 AM

## 2021-05-13 NOTE — Progress Notes (Signed)
  PROGRESS NOTE  Patient admitted earlier this morning. See H&P.   Patient with history of COPD with chronic respiratory failure on 3 L oxygen at baseline who presented with 2 to 3-day onset of worsening shortness of breath.  On examination this morning, patient remains on 5 L oxygen satting 86 to 92% with diminished breath sounds.  Breathing has improved but not back to baseline.  Chest x-ray was negative for acute process.  Continue azithromycin Continue Solu-Medrol Continue breathing treatments  Status is: Observation  The patient will require care spanning > 2 midnights and should be moved to inpatient because: Inpatient level of care appropriate due to severity of illness  Dispo: The patient is from: Home              Anticipated d/c is to: Home              Patient currently is not medically stable to d/c.   Difficult to place patient No       Dessa Phi, DO [facility] 05/13/2021, 9:21 AM  Available via Epic secure chat 7am-7pm After these hours, please refer to coverage provider listed on [email or web address]

## 2021-05-13 NOTE — ED Notes (Signed)
Just attempted to call report for the third time without success.

## 2021-05-14 LAB — GLUCOSE, CAPILLARY
Glucose-Capillary: 124 mg/dL — ABNORMAL HIGH (ref 70–99)
Glucose-Capillary: 97 mg/dL (ref 70–99)
Glucose-Capillary: 173 mg/dL — ABNORMAL HIGH (ref 70–99)
Glucose-Capillary: 111 mg/dL — ABNORMAL HIGH (ref 70–99)

## 2021-05-14 NOTE — Progress Notes (Signed)
PROGRESS NOTE    [name]  YIF:[id number] DOB: Jun 13, 1961 DOA: 05/12/2021 PCP: Adaline Sill, NP     Brief Narrative:  Miguel Marsh is a 60 y.o. male with medical history significant for COPD on 3 LPM of oxygen at home, GERD, hypertension who presents to the emergency department due to 2-3-day onset of worsening shortness of breath which rapidly got worse yesterday.  He endorsed chronic cough which has not changed in nature.  Patient states that he used home medication without improvement and he has since increased his oxygen supplementation to 5 LPM.  Patient decided to go to the ED for further evaluation and management.  He denies fever, chills, chest pain, nausea or vomiting.   In the emergency department, he was intermittently tachypneic, other vital signs were within normal range.  O2 sat ranged from 91% to 100% via [place] at 5 LPM.  Work-up in the ED showed normal CBC and BMP except for hyperglycemia.  Influenza A, B and SARS coronavirus 2 was negative.  Chest x-ray showed no acute abnormality.  Breathing treatment was provided, Solu-Medrol was given.  Hospitalist was asked to admit patient for further evaluation and management.  New events last 24 hours / Subjective: Patient remains on 5 L nasal cannula O2 this morning.  States that he has improved with his breathing at rest.  Assessment & Plan:   Principal Problem:   COPD with acute exacerbation ([place]) Active Problems:   Tobacco use disorder   COPD exacerbation (HCC)   Obesity   Essential hypertension   GERD (gastroesophageal reflux disease)   Acute on chronic respiratory failure with hypoxia (HCC)   Hyperglycemia   COPD exacerbation -Continue Solu-Medrol, azithromycin, breathing treatments  Acute on chronic hypoxemic respiratory failure -Requires 3 L nasal cannula at baseline, currently on 5 L this morning.  Continue to wean back to baseline level as able  Tobacco abuse -Cessation counseling, nicotine  patch  Prediabetes -Hemoglobin A1c 6 -Sliding scale insulin  Hypertension -Continue HCTZ  Anxiety -Continue Xanax  Obesity -Estimated body mass index is 35.69 kg/m as calculated from the following:   Height as of this encounter: 5\' 6"  (1.676 m).   Weight as of this encounter: 100.3 kg.   DVT prophylaxis:  enoxaparin (LOVENOX) injection 40 mg Start: 05/13/21 0800 SCDs Start: 05/13/21 0523  Code Status:     Code Status Orders  (From admission, onward)           Start     Ordered   05/13/21 0523  Full code  Continuous        05/13/21 0522           Code Status History     Date Active Date Inactive Code Status Order ID Comments User Context   10/06/2020 0614 10/10/2020 1802 Full Code [id number]  Bernadette Hoit, DO ED   08/29/2020 1630 09/02/2020 1606 Full Code [id number]  Barton Dubois, MD ED   08/29/2020 1407 08/29/2020 1630 DNR [id number]  Barton Dubois, [place] [facility]   04/14/2020 1654 04/16/2020 1658 DNR [id number]  Drue Novel, NP Inpatient   04/12/2020 1733 04/14/2020 1654 Full Code [id number]  Murlean Iba, MD Inpatient   01/23/2020 0846 01/28/2020 2025 Full Code [id number]  Bethena Roys, MD Inpatient   01/22/2020 2133 01/23/2020 0845 Full Code [id number]  Germain Osgood, PA-C Inpatient   12/11/2019 2207 12/16/2019 1650 Full Code [id number]  Bethena Roys, MD Inpatient   07/16/2018 1504 07/17/2018 1230 Full Code [id number]  Roxan Hockey, MD ED   03/18/2018 1541 03/22/2018 1619 Full Code [id number]  Phillips Grout, MD ED   03/06/2018 0631 03/08/2018 1335 Full Code [id number]  Oswald Hillock, MD Inpatient   01/23/2018 0859 01/28/2018 1943 Full Code [id number]  [name], MD ED   01/23/2018 0618 01/23/2018 0859 Full Code [id number]  Reubin Milan, MD ED   12/31/2017 1550 01/02/2018 1618 Full Code [id number]  Damita Lack, MD ED   12/09/2015 1754 12/12/2015 1703 Full Code [id number]  Waldemar Dickens, MD ED   12/17/2014 0115 12/18/2014 1601 Full Code [id number]   Theressa Millard, MD Inpatient      Family Communication: No family at bedside Disposition Plan:  Status is: Inpatient  Remains inpatient appropriate because:Inpatient level of care appropriate due to severity of illness  Dispo: The patient is from: Home              Anticipated d/c is to: Home              Patient currently is not medically stable to d/c.   Difficult to place patient No      Antimicrobials:  Anti-infectives (From admission, onward)    Start     Dose/Rate Route Frequency Ordered Stop   05/14/21 1000  azithromycin (ZITHROMAX) tablet 250 mg       See Hyperspace for full Linked Orders Report.   250 mg Oral Daily 05/13/21 0515 05/18/21 0959   05/13/21 1000  azithromycin (ZITHROMAX) tablet 500 mg       See Hyperspace for full Linked Orders Report.   500 mg Oral Daily 05/13/21 0515 05/13/21 1005        Objective: Vitals:   05/14/21 0123 05/14/21 0601 05/14/21 0741 05/14/21 0836  BP: (!) 145/77 137/80    Pulse: 82 85    Resp: 20 20    Temp: 98 F (36.7 C) 97.8 F (36.6 C)    TempSrc: Oral Oral    SpO2: 98% 94% 94% 94%  Weight:      Height:       No intake or output data in the 24 hours ending 05/14/21 1125 Filed Weights   05/12/21 2058  Weight: 100.3 kg    Examination:  General exam: Appears calm and comfortable  Respiratory system: Diminished breath sounds but improved from previous examination.  No wheeze. Respiratory effort normal. No respiratory distress. No conversational dyspnea.  On 5 L oxygen Cardiovascular system: S1 & S2 heard, RRR. No murmurs. No pedal edema. Gastrointestinal system: Abdomen is nondistended, soft and nontender. Normal bowel sounds heard. Central nervous system: Alert and oriented. No focal neurological deficits. Speech clear.  Extremities: Symmetric in appearance  Skin: No rashes, lesions or ulcers on exposed skin  Psychiatry: Judgement and insight appear normal. Mood & affect appropriate.   Data Reviewed: I have  personally reviewed following labs and imaging studies  CBC: Recent Labs  Lab 05/12/21 2104 05/13/21 0608  WBC 8.7 8.1  NEUTROABS 4.9  --   HGB 15.1 14.5  HCT 49.7 48.6  MCV 97.5 98.0  PLT 201 053   Basic Metabolic Panel: Recent Labs  Lab 05/12/21 2104 05/13/21 0608  NA 141 140  K 3.9 4.6  CL 96* 95*  CO2 39* 37*  GLUCOSE 189* 199*  BUN 10 12  CREATININE 0.77 0.90  CALCIUM 8.4* 9.0  MG  --  1.9  PHOS  --  2.3*   GFR: Estimated Creatinine Clearance: 98 mL/min (by C-G formula  based on SCr of 0.9 mg/dL). Liver Function Tests: Recent Labs  Lab 05/13/21 0608  AST 16  ALT 14  ALKPHOS 80  BILITOT 0.4  PROT 7.0  ALBUMIN 3.5   No results for input(s): LIPASE, AMYLASE in the last 168 hours. No results for input(s): AMMONIA in the last 168 hours. Coagulation Profile: Recent Labs  Lab 05/13/21 0608  INR 1.1   Cardiac Enzymes: No results for input(s): CKTOTAL, CKMB, CKMBINDEX, TROPONINI in the last 168 hours. BNP (last 3 results) No results for input(s): PROBNP in the last 8760 hours. HbA1C: Recent Labs    05/12/21 2104  HGBA1C 6.0*   CBG: Recent Labs  Lab 05/13/21 0752 05/13/21 1235 05/13/21 1720 05/13/21 2201 05/14/21 0739  GLUCAP 165* 134* 103* 163* 124*   Lipid Profile: No results for input(s): CHOL, HDL, LDLCALC, TRIG, CHOLHDL, LDLDIRECT in the last 72 hours. Thyroid Function Tests: No results for input(s): TSH, T4TOTAL, FREET4, T3FREE, THYROIDAB in the last 72 hours. Anemia Panel: No results for input(s): VITAMINB12, FOLATE, FERRITIN, TIBC, IRON, RETICCTPCT in the last 72 hours. Sepsis Labs: No results for input(s): PROCALCITON, LATICACIDVEN in the last 168 hours.  Recent Results (from the past 240 hour(s))  Resp Panel by RT-PCR (Flu A&B, Covid) Nasopharyngeal Swab     Status: None   Collection Time: 05/12/21  9:06 PM   Specimen: Nasopharyngeal Swab; Nasopharyngeal(NP) swabs in vial transport medium  Result Value Ref Range Status   SARS  Coronavirus 2 by RT PCR NEGATIVE NEGATIVE Final    Comment: (NOTE) SARS-CoV-2 target nucleic acids are NOT DETECTED.  The SARS-CoV-2 RNA is generally detectable in upper respiratory specimens during the acute phase of infection. The lowest concentration of SARS-CoV-2 viral copies this assay can detect is 138 copies/mL. A negative result does not preclude SARS-Cov-2 infection and should not be used as the sole basis for treatment or other patient management decisions. A negative result may occur with  improper specimen collection/handling, submission of specimen other than nasopharyngeal swab, presence of viral mutation(s) within the areas targeted by this assay, and inadequate number of viral copies(<138 copies/mL). A negative result must be combined with clinical observations, patient history, and epidemiological information. The expected result is Negative.  Fact Sheet for Patients:  EntrepreneurPulse.com.au  Fact Sheet for Healthcare Providers:  IncredibleEmployment.be  This test is no t yet approved or cleared by the [place] FDA and  has been authorized for detection and/or diagnosis of SARS-CoV-2 by FDA under an Emergency Use Authorization (EUA). This EUA will remain  in effect (meaning this test can be used) for the duration of the COVID-19 declaration under Section 564(b)(1) of the Act, 21 U.S.C.section 360bbb-3(b)(1), unless the authorization is terminated  or revoked sooner.       Influenza A by PCR NEGATIVE NEGATIVE Final   Influenza B by PCR NEGATIVE NEGATIVE Final    Comment: (NOTE) The Xpert Xpress SARS-CoV-2/FLU/RSV plus assay is intended as an aid in the diagnosis of influenza from Nasopharyngeal swab specimens and should not be used as a sole basis for treatment. Nasal washings and aspirates are unacceptable for Xpert Xpress SARS-CoV-2/FLU/RSV testing.  Fact Sheet for  Patients: EntrepreneurPulse.com.au  Fact Sheet for Healthcare Providers: IncredibleEmployment.be  This test is not yet approved or cleared by the [place] FDA and has been authorized for detection and/or diagnosis of SARS-CoV-2 by FDA under an Emergency Use Authorization (EUA). This EUA will remain in effect (meaning this test can be used) for the duration of the  COVID-19 declaration under Section 564(b)(1) of the Act, 21 U.S.C. section 360bbb-3(b)(1), unless the authorization is terminated or revoked.  Performed at [facility], [address], [place], [place] [address]       Radiology Studies: [facility] Chest [facility] 1 View  Result Date: 05/12/2021 CLINICAL DATA:  Shortness of breath EXAM: PORTABLE CHEST 1 VIEW COMPARISON:  10/06/2020 FINDINGS: Cardiac shadow is within normal limits. No focal infiltrate or sizable effusion is seen. Some minimal scarring in the left base is noted. No bony abnormality is seen. IMPRESSION: No acute abnormality noted. Electronically Signed   By: Inez Catalina M.D.   On: 05/12/2021 22:25      Scheduled Meds:  azithromycin  250 mg Oral Daily   COVID-19 mRNA vaccine (Moderna)  0.25 mL Intramuscular ONCE-1600   dextromethorphan-guaiFENesin  1 tablet Oral BID   enoxaparin (LOVENOX) injection  40 mg Subcutaneous Q24H   fluticasone furoate-vilanterol  1 puff Inhalation Daily   And   umeclidinium bromide  1 puff Inhalation Daily   hydrochlorothiazide  25 mg Oral q morning   insulin aspart  0-15 Units Subcutaneous TID WC   insulin aspart  0-5 Units Subcutaneous QHS   ipratropium-albuterol  3 mL Nebulization Q6H   methylPREDNISolone (SOLU-MEDROL) injection  40 mg Intravenous Q12H   nicotine  14 mg Transdermal Daily   pantoprazole  80 mg Oral Daily   Continuous Infusions:   LOS: 1 day      Time spent: 20 minutes   Dessa Phi, DO [facility] 05/14/2021, 11:25 AM   Available via Epic secure chat  7am-7pm After these hours, please refer to coverage provider listed on [email or web address]

## 2021-05-15 LAB — GLUCOSE, CAPILLARY
Glucose-Capillary: 101 mg/dL — ABNORMAL HIGH (ref 70–99)
Glucose-Capillary: 102 mg/dL — ABNORMAL HIGH (ref 70–99)

## 2021-05-15 MED ORDER — AZITHROMYCIN 250 MG PO TABS: 250.0000 mg | ORAL_TABLET | Freq: Every day | ORAL | 0 refills | Status: AC

## 2021-05-15 MED ORDER — PREDNISONE 10 MG PO TABS: ORAL_TABLET | ORAL | 0 refills | Status: DC

## 2021-05-15 NOTE — Care Plan (Signed)
Pt requested PRN xanax to help relax to sleep . Pt slept through the night. Hal Neer RN 5:03 AM 05/15/21

## 2021-05-15 NOTE — TOC Transition Note (Addendum)
Transition of Care [facility]) - CM/SW Discharge Note   Patient Details  Name: ELIGIO ANGERT MRN: [id number] Date of Birth: 1961/01/15  Transition of Care [facility]) CM/SW Contact:  Natasha Bence, LCSW Phone Number: 05/15/2021, 1:04 PM   Clinical Narrative:    CSW notified of patient's readiness for discharge. Patient on continuous O2. Patient reported that he has O2 at home, but is not able to get portable brought to hospital. Patient reported that he does not know which company his O2 is with. CSW contacted [employer], [employer] and [employer]. Neither agency reported that patient is a client of theirs. CSW not able to identify O2 company for portable O2 delivery. CSW contacted EMS for transport and completed med necessity. TOC signing off.  Addendum [facility] able to provide O2 tank to be returned. CSW scheduled [facility] transportation. [facility] transportation agreeable to take patient. TOC signing off.   Final next level of care: Home/Self Care Barriers to Discharge: Barriers Resolved   Patient Goals and CMS Choice Patient states their goals for this hospitalization and ongoing recovery are:: Return home CMS Medicare.gov Compare Post Acute Care list provided to:: Patient Choice offered to / list presented to : Patient  Discharge Placement                Patient to be transferred to facility by: [facility]   Patient and family notified of of transfer: 05/15/21  Discharge Plan and Services                                     Social Determinants of Health (SDOH) Interventions     Readmission Risk Interventions No flowsheet data found.

## 2021-05-15 NOTE — Discharge Summary (Signed)
Physician Discharge Summary  Miguel Marsh FIE:[id number] DOB: 1961/08/04 DOA: 05/12/2021  PCP: Adaline Sill, NP  Admit date: 05/12/2021 Discharge date: 05/15/2021  Admitted From: Home Disposition:  Home   Recommendations for Outpatient Follow-up:  Follow up with PCP in 1 week  Discharge Condition: Stable CODE STATUS: Full  Diet recommendation: Heart healthy   Brief/Interim Summary: Miguel Marsh is a 60 y.o. male with medical history significant for COPD on 3 LPM of oxygen at home, GERD, hypertension who presents to the emergency department due to 2-3-day onset of worsening shortness of breath which rapidly got worse yesterday.  He endorsed chronic cough which has not changed in nature.  Patient states that he used home medication without improvement and he has since increased his oxygen supplementation to 5 LPM.  Patient decided to go to the ED for further evaluation and management.  He denies fever, chills, chest pain, nausea or vomiting.   In the emergency department, he was intermittently tachypneic, other vital signs were within normal range.  O2 sat ranged from 91% to 100% via [place] at 5 LPM.  Work-up in the ED showed normal CBC and BMP except for hyperglycemia.  Influenza A, B and SARS coronavirus 2 was negative.  Chest x-ray showed no acute abnormality.  Breathing treatment was provided, Solu-Medrol was given.  Hospitalist was asked to admit patient for further evaluation and management.  Subjective on day of discharge: Patient was weaned to his home 3 L nasal cannula O2.  Breathing continue to improve with Solu-Medrol, azithromycin, breathing treatments.  Discharge Diagnoses:  Principal Problem:   COPD with acute exacerbation ([place]) Active Problems:   Tobacco use disorder   COPD exacerbation ([facility])   Obesity   Essential hypertension   GERD (gastroesophageal reflux disease)   Acute on chronic respiratory failure with hypoxia ([facility])   Hyperglycemia   COPD  exacerbation -Continue prednisone taper, azithromycin, breathing treatments  Acute on chronic hypoxemic respiratory failure -Requires 3 L nasal cannula at baseline, required 5 L oxygen and now weaned back to his baseline level   Tobacco abuse -Cessation counseling, nicotine patch   Prediabetes -Hemoglobin A1c 6  Hypertension -Continue HCTZ   Anxiety -Continue Xanax   Obesity -Estimated body mass index is 35.69 kg/m as calculated from the following:   Height as of this encounter: 5\' 6"  (1.676 m).   Weight as of this encounter: 100.3 kg.    Discharge Instructions  Discharge Instructions     Call MD for:  difficulty breathing, headache or visual disturbances   Complete by: As directed    Call MD for:  extreme fatigue   Complete by: As directed    Call MD for:  persistant dizziness or light-headedness   Complete by: As directed    Call MD for:  persistant nausea and vomiting   Complete by: As directed    Call MD for:  severe uncontrolled pain   Complete by: As directed    Call MD for:  temperature >100.4   Complete by: As directed    Diet - low sodium heart healthy   Complete by: As directed    Discharge instructions   Complete by: As directed    You were cared for by a hospitalist during your hospital stay. If you have any questions about your discharge medications or the care you received while you were in the hospital after you are discharged, you can call the unit and ask to speak with the hospitalist on call if  the hospitalist that took care of you is not available. Once you are discharged, your primary care physician will handle any further medical issues. Please note that NO REFILLS for any discharge medications will be authorized once you are discharged, as it is imperative that you return to your primary care physician (or establish a relationship with a primary care physician if you do not have one) for your aftercare needs so that they can reassess your need for  medications and monitor your lab values.   Increase activity slowly   Complete by: As directed       Allergies as of 05/15/2021   No Known Allergies      Medication List     STOP taking these medications    nicotine 14 mg/24hr patch Commonly known as: NICODERM CQ - dosed in mg/24 hours       TAKE these medications    albuterol 108 (90 Base) MCG/ACT inhaler Commonly known as: VENTOLIN HFA Inhale 2 puffs into the lungs every 6 (six) hours as needed for wheezing.   albuterol (2.5 MG/3ML) 0.083% nebulizer solution Commonly known as: PROVENTIL Inhale 2.5 mg into the lungs every 6 (six) hours as needed for wheezing or shortness of breath.   ALPRAZolam 0.5 MG tablet Commonly known as: XANAX Take 0.5 mg by mouth 3 (three) times daily as needed.   azithromycin 250 MG tablet Commonly known as: ZITHROMAX Take 1 tablet (250 mg total) by mouth daily for 2 days.   hydrochlorothiazide 25 MG tablet Commonly known as: HYDRODIURIL Take 25 mg by mouth every morning.   ipratropium-albuterol 0.5-2.5 (3) MG/3ML Soln Commonly known as: DUONEB Take 3 mLs by nebulization every 4 (four) hours as needed. What changed: reasons to take this   omeprazole 40 MG capsule Commonly known as: PRILOSEC Take 1 capsule by mouth daily.   oxyCODONE 15 MG immediate release tablet Commonly known as: ROXICODONE Take 15 mg by mouth 4 (four) times daily as needed for pain.   OXYGEN Inhale 3 L into the lungs continuous.   predniSONE 10 MG tablet Commonly known as: DELTASONE Take 4 tabs for 3 days, then 3 tabs for 3 days, then 2 tabs for 3 days, then 1 tab for 3 days, then 1/2 tab for 4 days.   Trelegy Ellipta 100-62.5-25 MCG/INH Aepb Generic drug: Fluticasone-Umeclidin-Vilant Inhale 1 puff into the lungs daily.        Follow-up Information     Adaline Sill, NP. Schedule an appointment as soon as possible for a visit in 1 week(s).   Specialty: Internal Medicine Contact  information: [address] [place] [address] [phone]                No Known Allergies  Consultations: None    Procedures/Studies: DG Chest Port 1 View  Result Date: 05/12/2021 CLINICAL DATA:  Shortness of breath EXAM: PORTABLE CHEST 1 VIEW COMPARISON:  10/06/2020 FINDINGS: Cardiac shadow is within normal limits. No focal infiltrate or sizable effusion is seen. Some minimal scarring in the left base is noted. No bony abnormality is seen. IMPRESSION: No acute abnormality noted. Electronically Signed   By: Inez Catalina M.D.   On: 05/12/2021 22:25       Discharge Exam: Vitals:   05/15/21 0737 05/15/21 0742  BP:    Pulse:    Resp:    Temp:    SpO2: 94% 94%    General: Pt is alert, awake, not in acute distress Cardiovascular: RRR,  S1/S2 +, no edema Respiratory: CTA bilaterally, no wheezing, no rhonchi, no respiratory distress, no conversational dyspnea, on 3L O2  Abdominal: Soft, NT, ND, bowel sounds + Extremities: no edema, no cyanosis Psych: Normal mood and affect, stable judgement and insight     The results of significant diagnostics from this hospitalization (including imaging, microbiology, ancillary and laboratory) are listed below for reference.     Microbiology: Recent Results (from the past 240 hour(s))  Resp Panel by RT-PCR (Flu A&B, Covid) Nasopharyngeal Swab     Status: None   Collection Time: 05/12/21  9:06 PM   Specimen: Nasopharyngeal Swab; Nasopharyngeal(NP) swabs in vial transport medium  Result Value Ref Range Status   SARS Coronavirus 2 by RT PCR NEGATIVE NEGATIVE Final    Comment: (NOTE) SARS-CoV-2 target nucleic acids are NOT DETECTED.  The SARS-CoV-2 RNA is generally detectable in upper respiratory specimens during the acute phase of infection. The lowest concentration of SARS-CoV-2 viral copies this assay can detect is 138 copies/mL. A negative result does not preclude SARS-Cov-2 infection and should not be used as the sole  basis for treatment or other patient management decisions. A negative result may occur with  improper specimen collection/handling, submission of specimen other than nasopharyngeal swab, presence of viral mutation(s) within the areas targeted by this assay, and inadequate number of viral copies(<138 copies/mL). A negative result must be combined with clinical observations, patient history, and epidemiological information. The expected result is Negative.  Fact Sheet for Patients:  EntrepreneurPulse.com.au  Fact Sheet for Healthcare Providers:  IncredibleEmployment.be  This test is no t yet approved or cleared by the [place] FDA and  has been authorized for detection and/or diagnosis of SARS-CoV-2 by FDA under an Emergency Use Authorization (EUA). This EUA will remain  in effect (meaning this test can be used) for the duration of the COVID-19 declaration under Section 564(b)(1) of the Act, 21 U.S.C.section 360bbb-3(b)(1), unless the authorization is terminated  or revoked sooner.       Influenza A by PCR NEGATIVE NEGATIVE Final   Influenza B by PCR NEGATIVE NEGATIVE Final    Comment: (NOTE) The Xpert Xpress SARS-CoV-2/FLU/RSV plus assay is intended as an aid in the diagnosis of influenza from Nasopharyngeal swab specimens and should not be used as a sole basis for treatment. Nasal washings and aspirates are unacceptable for Xpert Xpress SARS-CoV-2/FLU/RSV testing.  Fact Sheet for Patients: EntrepreneurPulse.com.au  Fact Sheet for Healthcare Providers: IncredibleEmployment.be  This test is not yet approved or cleared by the [place] FDA and has been authorized for detection and/or diagnosis of SARS-CoV-2 by FDA under an Emergency Use Authorization (EUA). This EUA will remain in effect (meaning this test can be used) for the duration of the COVID-19 declaration under Section 564(b)(1) of the Act,  21 U.S.C. section 360bbb-3(b)(1), unless the authorization is terminated or revoked.  Performed at [facility], [address], [place], [place] [address]      Labs: BNP (last 3 results) Recent Labs    10/04/20 0859  BNP 270.3*   Basic Metabolic Panel: Recent Labs  Lab 05/12/21 2104 05/13/21 0608  NA 141 140  K 3.9 4.6  CL 96* 95*  CO2 39* 37*  GLUCOSE 189* 199*  BUN 10 12  CREATININE 0.77 0.90  CALCIUM 8.4* 9.0  MG  --  1.9  PHOS  --  2.3*   Liver Function Tests: Recent Labs  Lab 05/13/21 0608  AST 16  ALT 14  ALKPHOS 80  BILITOT  0.4  PROT 7.0  ALBUMIN 3.5   No results for input(s): LIPASE, AMYLASE in the last 168 hours. No results for input(s): AMMONIA in the last 168 hours. CBC: Recent Labs  Lab 05/12/21 2104 05/13/21 0608  WBC 8.7 8.1  NEUTROABS 4.9  --   HGB 15.1 14.5  HCT 49.7 48.6  MCV 97.5 98.0  PLT 201 194   Cardiac Enzymes: No results for input(s): CKTOTAL, CKMB, CKMBINDEX, TROPONINI in the last 168 hours. BNP: Invalid input(s): POCBNP CBG: Recent Labs  Lab 05/14/21 0739 05/14/21 1124 05/14/21 1636 05/14/21 2132 05/15/21 0743  GLUCAP 124* 97 173* 111* 101*   D-Dimer No results for input(s): DDIMER in the last 72 hours. Hgb A1c Recent Labs    05/12/21 2104  HGBA1C 6.0*   Lipid Profile No results for input(s): CHOL, HDL, LDLCALC, TRIG, CHOLHDL, LDLDIRECT in the last 72 hours. Thyroid function studies No results for input(s): TSH, T4TOTAL, T3FREE, THYROIDAB in the last 72 hours.  Invalid input(s): FREET3 Anemia work up No results for input(s): VITAMINB12, FOLATE, FERRITIN, TIBC, IRON, RETICCTPCT in the last 72 hours. Urinalysis    Component Value Date/Time   COLORURINE YELLOW 01/22/2020 1251   APPEARANCEUR CLEAR 01/22/2020 1251   LABSPEC 1.017 01/22/2020 1251   PHURINE 6.0 01/22/2020 1251   GLUCOSEU NEGATIVE 01/22/2020 1251   HGBUR NEGATIVE 01/22/2020 1251   BILIRUBINUR NEGATIVE 01/22/2020 1251   KETONESUR NEGATIVE  01/22/2020 1251   PROTEINUR NEGATIVE 01/22/2020 1251   UROBILINOGEN 0.2 10/21/2013 0200   NITRITE NEGATIVE 01/22/2020 1251   LEUKOCYTESUR NEGATIVE 01/22/2020 1251   Sepsis Labs Invalid input(s): PROCALCITONIN,  WBC,  LACTICIDVEN Microbiology Recent Results (from the past 240 hour(s))  Resp Panel by RT-PCR (Flu A&B, Covid) Nasopharyngeal Swab     Status: None   Collection Time: 05/12/21  9:06 PM   Specimen: Nasopharyngeal Swab; Nasopharyngeal(NP) swabs in vial transport medium  Result Value Ref Range Status   SARS Coronavirus 2 by RT PCR NEGATIVE NEGATIVE Final    Comment: (NOTE) SARS-CoV-2 target nucleic acids are NOT DETECTED.  The SARS-CoV-2 RNA is generally detectable in upper respiratory specimens during the acute phase of infection. The lowest concentration of SARS-CoV-2 viral copies this assay can detect is 138 copies/mL. A negative result does not preclude SARS-Cov-2 infection and should not be used as the sole basis for treatment or other patient management decisions. A negative result may occur with  improper specimen collection/handling, submission of specimen other than nasopharyngeal swab, presence of viral mutation(s) within the areas targeted by this assay, and inadequate number of viral copies(<138 copies/mL). A negative result must be combined with clinical observations, patient history, and epidemiological information. The expected result is Negative.  Fact Sheet for Patients:  EntrepreneurPulse.com.au  Fact Sheet for Healthcare Providers:  IncredibleEmployment.be  This test is no t yet approved or cleared by the [place] FDA and  has been authorized for detection and/or diagnosis of SARS-CoV-2 by FDA under an Emergency Use Authorization (EUA). This EUA will remain  in effect (meaning this test can be used) for the duration of the COVID-19 declaration under Section 564(b)(1) of the Act, 21 U.S.C.section 360bbb-3(b)(1),  unless the authorization is terminated  or revoked sooner.       Influenza A by PCR NEGATIVE NEGATIVE Final   Influenza B by PCR NEGATIVE NEGATIVE Final    Comment: (NOTE) The Xpert Xpress SARS-CoV-2/FLU/RSV plus assay is intended as an aid in the diagnosis of influenza from Nasopharyngeal swab specimens and should not  be used as a sole basis for treatment. Nasal washings and aspirates are unacceptable for Xpert Xpress SARS-CoV-2/FLU/RSV testing.  Fact Sheet for Patients: EntrepreneurPulse.com.au  Fact Sheet for Healthcare Providers: IncredibleEmployment.be  This test is not yet approved or cleared by the [place] FDA and has been authorized for detection and/or diagnosis of SARS-CoV-2 by FDA under an Emergency Use Authorization (EUA). This EUA will remain in effect (meaning this test can be used) for the duration of the COVID-19 declaration under Section 564(b)(1) of the Act, 21 U.S.C. section 360bbb-3(b)(1), unless the authorization is terminated or revoked.  Performed at [facility], [address] [place], [place] [address]      Patient was seen and examined on the day of discharge and was found to be in stable condition. Time coordinating discharge: 20 minutes including assessment and coordination of care, as well as examination of the patient.   SIGNED:  Dessa Phi, DO [facility] 05/15/2021, 8:51 AM

## 2022-04-28 IMAGING — DX DG CHEST 1V PORT
1 series · 1 of 1 positions shown · non-contrast
Comparison: 10/06/2020

CLINICAL DATA: Shortness of breath

EXAM:
PORTABLE CHEST 1 VIEW

[chest ap]
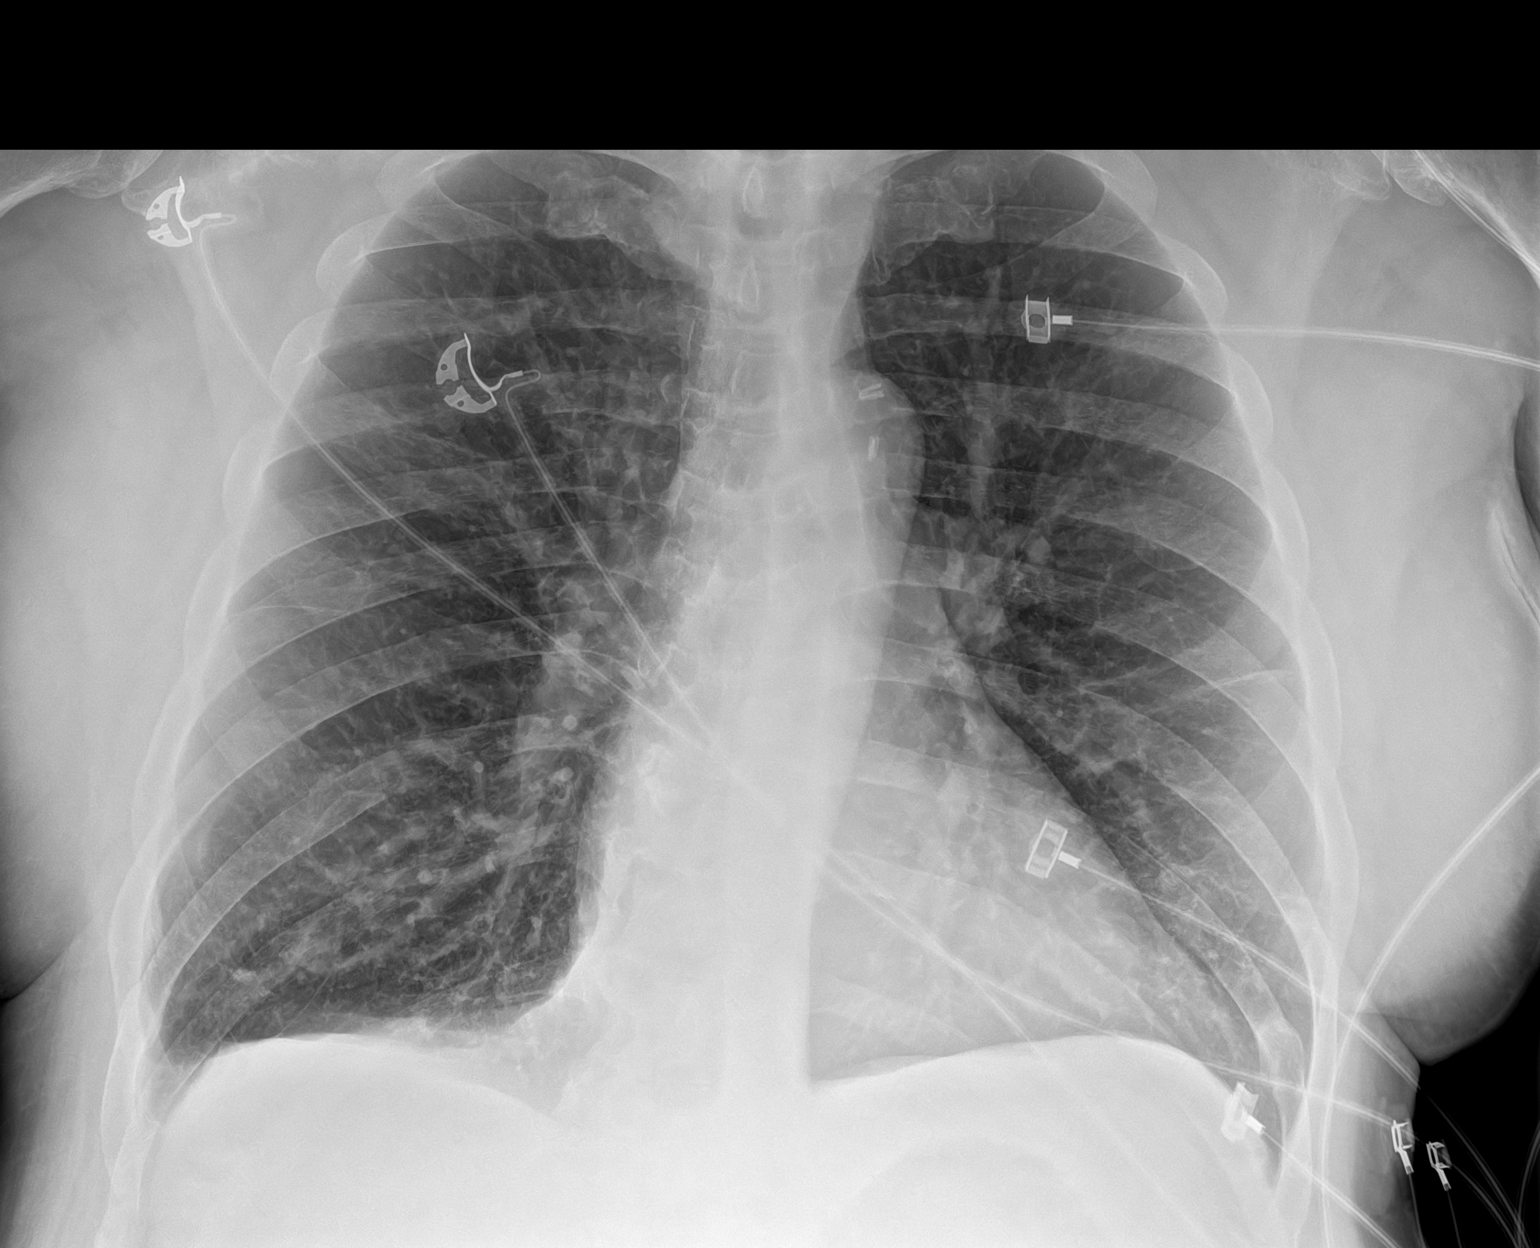

[1 of 1 positions shown; findings below may reference images not displayed]

FINDINGS: Cardiac shadow is within normal limits. No focal infiltrate or
sizable effusion is seen. Some minimal scarring in the left base is
noted. No bony abnormality is seen.
IMPRESSION: No acute abnormality noted.

## 2022-06-08 ENCOUNTER — Emergency Department (HOSPITAL_COMMUNITY): Payer: 59

## 2022-06-08 ENCOUNTER — Encounter (HOSPITAL_COMMUNITY): Payer: Self-pay

## 2022-06-08 ENCOUNTER — Other Ambulatory Visit: Payer: Self-pay

## 2022-06-08 ENCOUNTER — Inpatient Hospital Stay (HOSPITAL_COMMUNITY)
Admission: EM | Admit: 2022-06-08 | Discharge: 2022-06-11 | DRG: 190 | Disposition: A | Discharge status: Home / Self Care | Payer: 59 | Attending: Internal Medicine | Admitting: Internal Medicine

## 2022-06-08 DIAGNOSIS — F17200 Nicotine dependence, unspecified, uncomplicated: Secondary | ICD-10-CM | POA: Diagnosis present

## 2022-06-08 DIAGNOSIS — J9611 Chronic respiratory failure with hypoxia: Secondary | ICD-10-CM | POA: Diagnosis not present

## 2022-06-08 DIAGNOSIS — C799 Secondary malignant neoplasm of unspecified site: Secondary | ICD-10-CM | POA: Diagnosis present

## 2022-06-08 DIAGNOSIS — F419 Anxiety disorder, unspecified: Secondary | ICD-10-CM | POA: Diagnosis present

## 2022-06-08 DIAGNOSIS — C3491 Malignant neoplasm of unspecified part of right bronchus or lung: Secondary | ICD-10-CM | POA: Diagnosis present

## 2022-06-08 DIAGNOSIS — M1990 Unspecified osteoarthritis, unspecified site: Secondary | ICD-10-CM | POA: Diagnosis present

## 2022-06-08 DIAGNOSIS — Z902 Acquired absence of lung [part of]: Secondary | ICD-10-CM | POA: Diagnosis not present

## 2022-06-08 DIAGNOSIS — R7302 Impaired glucose tolerance (oral): Secondary | ICD-10-CM | POA: Diagnosis not present

## 2022-06-08 DIAGNOSIS — Z9981 Dependence on supplemental oxygen: Secondary | ICD-10-CM | POA: Diagnosis not present

## 2022-06-08 DIAGNOSIS — K219 Gastro-esophageal reflux disease without esophagitis: Secondary | ICD-10-CM | POA: Diagnosis present

## 2022-06-08 DIAGNOSIS — J9612 Chronic respiratory failure with hypercapnia: Secondary | ICD-10-CM | POA: Diagnosis not present

## 2022-06-08 DIAGNOSIS — R739 Hyperglycemia, unspecified: Secondary | ICD-10-CM | POA: Diagnosis not present

## 2022-06-08 DIAGNOSIS — J9621 Acute and chronic respiratory failure with hypoxia: Secondary | ICD-10-CM | POA: Diagnosis present

## 2022-06-08 DIAGNOSIS — Z85118 Personal history of other malignant neoplasm of bronchus and lung: Secondary | ICD-10-CM

## 2022-06-08 DIAGNOSIS — Z79899 Other long term (current) drug therapy: Secondary | ICD-10-CM

## 2022-06-08 DIAGNOSIS — I444 Left anterior fascicular block: Secondary | ICD-10-CM | POA: Diagnosis present

## 2022-06-08 DIAGNOSIS — J9811 Atelectasis: Secondary | ICD-10-CM | POA: Diagnosis present

## 2022-06-08 DIAGNOSIS — J441 Chronic obstructive pulmonary disease with (acute) exacerbation: Principal | ICD-10-CM | POA: Diagnosis present

## 2022-06-08 DIAGNOSIS — G893 Neoplasm related pain (acute) (chronic): Secondary | ICD-10-CM | POA: Diagnosis present

## 2022-06-08 DIAGNOSIS — F17210 Nicotine dependence, cigarettes, uncomplicated: Secondary | ICD-10-CM | POA: Diagnosis present

## 2022-06-08 DIAGNOSIS — J9622 Acute and chronic respiratory failure with hypercapnia: Secondary | ICD-10-CM | POA: Diagnosis present

## 2022-06-08 DIAGNOSIS — I10 Essential (primary) hypertension: Secondary | ICD-10-CM | POA: Diagnosis present

## 2022-06-08 DIAGNOSIS — T380X5A Adverse effect of glucocorticoids and synthetic analogues, initial encounter: Secondary | ICD-10-CM | POA: Diagnosis not present

## 2022-06-08 DIAGNOSIS — Z20822 Contact with and (suspected) exposure to covid-19: Secondary | ICD-10-CM | POA: Diagnosis present

## 2022-06-08 DIAGNOSIS — Y92239 Unspecified place in hospital as the place of occurrence of the external cause: Secondary | ICD-10-CM | POA: Diagnosis present

## 2022-06-08 LAB — CBC WITH DIFFERENTIAL/PLATELET
Abs Immature Granulocytes: 0.02 10*3/uL (ref 0.00–0.07)
Basophils Absolute: 0 10*3/uL (ref 0.0–0.1)
Basophils Relative: 1 %
Eosinophils Absolute: 0.1 10*3/uL (ref 0.0–0.5)
Eosinophils Relative: 2 %
HCT: 43.7 % (ref 39.0–52.0)
Hemoglobin: 13.6 g/dL (ref 13.0–17.0)
Immature Granulocytes: 0 %
Lymphocytes Relative: 24 %
Lymphs Abs: 1.6 10*3/uL (ref 0.7–4.0)
MCH: 29.5 pg (ref 26.0–34.0)
MCHC: 31.1 g/dL (ref 30.0–36.0)
MCV: 94.8 fL (ref 80.0–100.0)
Monocytes Absolute: 0.6 10*3/uL (ref 0.1–1.0)
Monocytes Relative: 9 %
Neutro Abs: 4.4 10*3/uL (ref 1.7–7.7)
Neutrophils Relative %: 64 %
Platelets: 202 10*3/uL (ref 150–400)
RBC: 4.61 MIL/uL (ref 4.22–5.81)
RDW: 11.9 % (ref 11.5–15.5)
WBC: 6.8 10*3/uL (ref 4.0–10.5)
nRBC: 0 % (ref 0.0–0.2)

## 2022-06-08 LAB — BASIC METABOLIC PANEL
Anion gap: 6 (ref 5–15)
BUN: 7 mg/dL — ABNORMAL LOW (ref 8–23)
CO2: 39 mmol/L — ABNORMAL HIGH (ref 22–32)
Calcium: 9 mg/dL (ref 8.9–10.3)
Chloride: 96 mmol/L — ABNORMAL LOW (ref 98–111)
Creatinine, Ser: 0.68 mg/dL (ref 0.61–1.24)
GFR, Estimated: >60 mL/min (ref >60)
Glucose, Bld: 109 mg/dL — ABNORMAL HIGH (ref 70–99)
Potassium: 3.8 mmol/L (ref 3.5–5.1)
Sodium: 141 mmol/L (ref 135–145)

## 2022-06-08 LAB — HIV ANTIBODY (ROUTINE TESTING W REFLEX): HIV Screen 4th Generation wRfx: NONREACTIVE

## 2022-06-08 LAB — RESP PANEL BY RT-PCR (FLU A&B, COVID) ARPGX2
Influenza A by PCR: NEGATIVE
Influenza B by PCR: NEGATIVE
SARS Coronavirus 2 by RT PCR: NEGATIVE

## 2022-06-08 MED ORDER — IPRATROPIUM-ALBUTEROL 0.5-2.5 (3) MG/3ML IN SOLN
6.0000 mL | Freq: Once | RESPIRATORY_TRACT | Status: AC
  Administered 2022-06-08: 6 mL via RESPIRATORY_TRACT
  Filled 2022-06-08: qty 6

## 2022-06-08 MED ORDER — OXYCODONE HCL 5 MG PO TABS
15.0000 mg | ORAL_TABLET | Freq: Four times a day (QID) | ORAL | Status: DC | PRN
  Administered 2022-06-08 – 2022-06-11 (×5): 15 mg via ORAL
  Filled 2022-06-08 (×5): qty 3

## 2022-06-08 MED ORDER — CLONAZEPAM 0.5 MG PO TABS
1.0000 mg | ORAL_TABLET | Freq: Two times a day (BID) | ORAL | Status: DC
  Administered 2022-06-08 – 2022-06-11 (×6): 1 mg via ORAL
  Filled 2022-06-08 (×7): qty 2

## 2022-06-08 MED ORDER — METHYLPREDNISOLONE SODIUM SUCC 125 MG IJ SOLR: 125.0000 mg | Freq: Once | INTRAMUSCULAR | Status: DC

## 2022-06-08 MED ORDER — ENOXAPARIN SODIUM 40 MG/0.4ML IJ SOSY
40.0000 mg | PREFILLED_SYRINGE | INTRAMUSCULAR | Status: DC
  Administered 2022-06-08 – 2022-06-10 (×3): 40 mg via SUBCUTANEOUS
  Filled 2022-06-08 (×2): qty 0.4

## 2022-06-08 MED ORDER — ACETAMINOPHEN 650 MG RE SUPP: 650.0000 mg | Freq: Four times a day (QID) | RECTAL | Status: DC | PRN

## 2022-06-08 MED ORDER — HYDROCHLOROTHIAZIDE 25 MG PO TABS
25.0000 mg | ORAL_TABLET | Freq: Every morning | ORAL | Status: DC
  Administered 2022-06-08 – 2022-06-11 (×4): 25 mg via ORAL
  Filled 2022-06-08 (×4): qty 1

## 2022-06-08 MED ORDER — METHYLPREDNISOLONE SODIUM SUCC 125 MG IJ SOLR
60.0000 mg | Freq: Once | INTRAMUSCULAR | Status: AC
  Administered 2022-06-08: 60 mg via INTRAVENOUS

## 2022-06-08 MED ORDER — IPRATROPIUM-ALBUTEROL 0.5-2.5 (3) MG/3ML IN SOLN
3.0000 mL | Freq: Once | RESPIRATORY_TRACT | Status: AC
  Administered 2022-06-08: 3 mL via RESPIRATORY_TRACT
  Filled 2022-06-08: qty 3

## 2022-06-08 MED ORDER — PANTOPRAZOLE SODIUM 40 MG PO TBEC
40.0000 mg | DELAYED_RELEASE_TABLET | Freq: Every day | ORAL | Status: DC
  Administered 2022-06-08 – 2022-06-11 (×4): 40 mg via ORAL
  Filled 2022-06-08 (×4): qty 1

## 2022-06-08 MED ORDER — ONDANSETRON HCL 4 MG/2ML IJ SOLN: 4.0000 mg | Freq: Four times a day (QID) | INTRAMUSCULAR | Status: DC | PRN

## 2022-06-08 MED ORDER — METHYLPREDNISOLONE SODIUM SUCC 125 MG IJ SOLR
60.0000 mg | Freq: Once | INTRAMUSCULAR | Status: DC
  Filled 2022-06-08: qty 2

## 2022-06-08 MED ORDER — ACETAMINOPHEN 325 MG PO TABS: 650.0000 mg | ORAL_TABLET | Freq: Four times a day (QID) | ORAL | Status: DC | PRN

## 2022-06-08 MED ORDER — ONDANSETRON HCL 4 MG PO TABS: 4.0000 mg | ORAL_TABLET | Freq: Four times a day (QID) | ORAL | Status: DC | PRN

## 2022-06-08 MED ORDER — METHYLPREDNISOLONE SODIUM SUCC 125 MG IJ SOLR
60.0000 mg | Freq: Two times a day (BID) | INTRAMUSCULAR | Status: DC
  Administered 2022-06-08 – 2022-06-10 (×4): 60 mg via INTRAVENOUS
  Filled 2022-06-08 (×4): qty 2

## 2022-06-08 MED ORDER — BUDESONIDE 0.5 MG/2ML IN SUSP
0.5000 mg | Freq: Two times a day (BID) | RESPIRATORY_TRACT | Status: DC
  Administered 2022-06-08 – 2022-06-11 (×6): 0.5 mg via RESPIRATORY_TRACT
  Filled 2022-06-08 (×6): qty 2

## 2022-06-08 NOTE — ED Triage Notes (Signed)
BIB EMS with complaints of worsening SHOB over the past week related to COPD. States that he used inhaler this am with no relief. Wears 3L oxygen at home. Denies pain. Current smoker.

## 2022-06-08 NOTE — ED Provider Notes (Signed)
[facility] EMERGENCY DEPARTMENT Provider Note   CSN: [id number] Arrival date & time: 06/08/22  7322     History  Chief Complaint  Patient presents with   Shortness of Breath    Miguel Marsh is a 61 y.o. male.  61 year old male with past medical history significant for COPD presents today for evaluation of shortness of breath that has been progressively worsening over the past 2 to 3 days.  Patient wears 3 L supplemental O2 at baseline.  Per my interview patient has increased work of breathing with pursed lip breathing.  Denies peripheral edema, orthopnea, PND.  Denies chest pain, or worsening in his cough.  Reports chronic productive cough.  States he is unable to ambulate any significant distance since his shortness of breath has been worsening the past 3 days.  He states at baseline he does not have any difficulty ambulating.  The history is provided by the patient. No language interpreter was used.       Home Medications Prior to Admission medications   Medication Sig Start Date End Date Taking? Authorizing Provider  albuterol (PROVENTIL HFA;VENTOLIN HFA) 108 (90 Base) MCG/ACT inhaler Inhale 2 puffs into the lungs every 6 (six) hours as needed for wheezing. 07/17/18   Roxan Hockey, MD  albuterol (PROVENTIL) (2.5 MG/3ML) 0.083% nebulizer solution Inhale 2.5 mg into the lungs every 6 (six) hours as needed for wheezing or shortness of breath.  01/19/20   [provider]  ALPRAZolam Duanne Moron) 0.5 MG tablet Take 0.5 mg by mouth 3 (three) times daily as needed. 04/25/21   [provider]  Fluticasone-Umeclidin-Vilant (TRELEGY ELLIPTA) 100-62.5-25 MCG/INH AEPB Inhale 1 puff into the lungs daily.    [provider]  hydrochlorothiazide (HYDRODIURIL) 25 MG tablet Take 25 mg by mouth every morning. 07/02/20   [provider]  ipratropium-albuterol (DUONEB) 0.5-2.5 (3) MG/3ML SOLN Take 3 mLs by nebulization every 4 (four) hours as needed. 12/16/19   [name], MD  omeprazole (PRILOSEC) 40 MG capsule Take 1 capsule by mouth daily. 11/06/16   [provider]  oxyCODONE (ROXICODONE) 15 MG immediate release tablet Take 15 mg by mouth 4 (four) times daily as needed for pain.  08/13/20   [provider]  OXYGEN Inhale 3 L into the lungs continuous.     [provider]  predniSONE (DELTASONE) 10 MG tablet Take 4 tabs for 3 days, then 3 tabs for 3 days, then 2 tabs for 3 days, then 1 tab for 3 days, then 1/2 tab for 4 days. 05/15/21   Dessa Phi, DO      Allergies    Patient has no known allergies.    Review of Systems   Review of Systems  Constitutional:  Negative for chills and fever.  Respiratory:  Positive for cough and shortness of breath.   Cardiovascular:  Negative for chest pain, palpitations and leg swelling.  Neurological:  Negative for weakness.  All other systems reviewed and are negative.   Physical Exam Updated Vital Signs BP (!) 144/90 (BP Location: Left Arm)   Pulse 84   Temp 98.8 F (37.1 C) (Oral)   Resp 12   Ht 5\' 6"  (1.676 m)   Wt 81.6 kg   SpO2 91%   BMI 29.05 kg/m  Physical Exam Vitals and nursing note reviewed.  Constitutional:      General: He is not in acute distress.    Appearance: Normal appearance. He is not ill-appearing.  HENT:  Head: Normocephalic and atraumatic.     Nose: Nose normal.  Eyes:     General: No scleral icterus.    Extraocular Movements: Extraocular movements intact.     Conjunctiva/sclera: Conjunctivae normal.  Cardiovascular:     Rate and Rhythm: Normal rate and regular rhythm.     Pulses: Normal pulses.  Pulmonary:     Effort: Pulmonary effort is normal. No respiratory distress.     Breath sounds: No wheezing or rales.     Comments: Diminished lung sounds throughout Abdominal:     General: There is no distension.     Tenderness: There is no abdominal tenderness.  Musculoskeletal:        General: Normal range of motion.     Cervical back:  Normal range of motion.     Right lower leg: No edema.     Left lower leg: No edema.  Skin:    General: Skin is warm and dry.  Neurological:     General: No focal deficit present.     Mental Status: He is alert. Mental status is at baseline.     ED Results / Procedures / Treatments   Labs (all labs ordered are listed, but only abnormal results are displayed) Labs Reviewed  RESP PANEL BY RT-PCR (FLU A&B, COVID) ARPGX2  CBC WITH DIFFERENTIAL/PLATELET  BASIC METABOLIC PANEL    EKG None  Radiology No results found.  Procedures Procedures    Medications Ordered in ED Medications  ipratropium-albuterol (DUONEB) 0.5-2.5 (3) MG/3ML nebulizer solution 3 mL (has no administration in time range)  methylPREDNISolone sodium succinate (SOLU-MEDROL) 125 mg/2 mL injection 60 mg (has no administration in time range)    ED Course/ Medical Decision Making/ A&P                           Medical Decision Making Amount and/or Complexity of Data Reviewed Labs: ordered. Radiology: ordered.  Risk Prescription drug management. Decision regarding hospitalization.   Medical Decision Making / ED Course   This patient presents to the ED for concern of shortness of breath, this involves an extensive number of treatment options, and is a complaint that carries with it a high risk of complications and morbidity.  The differential diagnosis includes ACS, PE, COPD exacerbation, CHF  MDM: 61 year old male with past medical history of COPD presents today with 3-day duration of progressively worsening dyspnea on exertion.  Increased work of breathing noted on my exam.  Diminished lung sounds throughout.  I ambulated patient within the room.  He was not able to ambulate much secondary to becoming lightheaded.  He maintained his sats around 89% on 3 L supplemental O2.  His work of breathing did get worse following ambulating.  Will obtain lab work, chest x-ray.  Will provide dose of Solu-Medrol, and  DuoNeb.  Will reevaluate.  Without signs of volume overload on exam. After initial dose of DuoNeb and Solu-Medrol patient with mild improvement.  I ambulated patient within the room and he desatted to 86% on 3 L supplemental O2.  Patient given additional 2 back-to-back nebulizer treatments.  Patient remains without improvement.  Significant dyspnea on exertion, and increased work of breathing after ambulating within the room.  Will discuss with hospitalist for evaluation.  Patient low risk for PE, PERC negative.  Without chest pain or other anginal symptoms.  Low suspicion for ACS.  KG without acute ischemic changes.    Lab Tests: -I ordered, reviewed, and interpreted  labs.   The pertinent results include:   Labs Reviewed  RESP PANEL BY RT-PCR (FLU A&B, COVID) ARPGX2  CBC WITH DIFFERENTIAL/PLATELET  BASIC METABOLIC PANEL      EKG  EKG Interpretation  Date/Time:    Ventricular Rate:    PR Interval:    QRS Duration:   QT Interval:    QTC Calculation:   R Axis:     Text Interpretation:           Imaging Studies ordered: I ordered imaging studies including chest x-ray I independently visualized and interpreted imaging. I agree with the radiologist interpretation   Medicines ordered and prescription drug management: Meds ordered this encounter  Medications   ipratropium-albuterol (DUONEB) 0.5-2.5 (3) MG/3ML nebulizer solution 3 mL   DISCONTD: methylPREDNISolone sodium succinate (SOLU-MEDROL) 125 mg/2 mL injection 125 mg   methylPREDNISolone sodium succinate (SOLU-MEDROL) 125 mg/2 mL injection 60 mg    -I have reviewed the patients home medicines and have made adjustments as needed  Cardiac Monitoring: The patient was maintained on a cardiac monitor.  I personally viewed and interpreted the cardiac monitored which showed an underlying rhythm of: Normal sinus rhythm  Reevaluation: After the interventions noted above, I reevaluated the patient and found that they have  :stayed the same  Co morbidities that complicate the patient evaluation  Past Medical History:  Diagnosis Date   Arthritis    Bronchitis    Cancer ([employer])    metastatic NSCLC (followed by [facility])   COPD (chronic obstructive pulmonary disease) ([facility]CC)    HTN (hypertension)       Dispostion: Patient discussed with hospitalist will evaluate patient for admission.  Final Clinical Impression(s) / ED Diagnoses Final diagnoses:  COPD exacerbation [facility])    Rx / DC Orders ED Discharge Orders     None         Evlyn Courier, PA-C 06/08/22 1445    Elgie Congo, MD 06/08/22 [phone]

## 2022-06-08 NOTE — H&P (Signed)
History and Physical    Patient: Miguel Marsh:[id number] DOB: [birth year] DOA: 06/08/2022 DOS: the patient was seen and examined on 06/08/2022 PCP: Miguel Sill, NP  Patient coming from: Home  Chief Complaint:  Chief Complaint  Patient presents with   Shortness of Breath   HPI: Miguel Marsh is 61 year old male with a history of metastatic non-small cell lung carcinoma, chronic respiratory failure on 3 L, COPD, GERD, tobacco abuse, and hypertension presenting with 1 week history of shortness of breath that has significantly worsened over the past 24 hours.  The patient reports a chronic essentially nonproductive cough.  He denies any fevers, chills, chest pain, hemoptysis, nausea or vomiting or diarrhea, abdominal pain, dysuria, hematuria.  He continues to smoke about 5 cigarettes/week.  He denies any illicit drug use.  He has been using his nebulizer machine without much improvement. In the ED, the patient was afebrile and hemodynamically stable with oxygen saturation 94-98% on 3 L.  BMP showed sodium 141, potassium 3.8, bicarbonate 39, serum creatinine 0.68.  WBC 6.8, hemoglobin 13.6, platelets 202,000.  EKG shows sinus rhythm and nonspecific T wave changes.  Chest x-ray showed chronic interstitial prominence.  There is bibasilar atelectasis.  The patient was given Solu-Medrol and bronchodilators but continued to have shortness of breath.  He was admitted for further evaluation and treatment.  Review of Systems: As mentioned in the history of present illness. All other systems reviewed and are negative. Past Medical History:  Diagnosis Date   Arthritis    Bronchitis    Cancer ([place])    metastatic NSCLC (followed by [facility])   COPD (chronic obstructive pulmonary disease) ([place])    HTN (hypertension)    Past Surgical History:  Procedure Laterality Date   LUNG BIOPSY     TOTAL HIP ARTHROPLASTY     TUMOR REMOVAL Right 12/04/2017   Social History:  reports that he has been  smoking cigarettes. He has been smoking an average of .1 packs per day. He has never used smokeless tobacco. He reports current alcohol use. He reports that he does not use drugs.  No Known Allergies  History reviewed. No pertinent family history.  Prior to Admission medications   Medication Sig Start Date End Date Taking? Authorizing Provider  albuterol (PROVENTIL HFA;VENTOLIN HFA) 108 (90 Base) MCG/ACT inhaler Inhale 2 puffs into the lungs every 6 (six) hours as needed for wheezing. 07/17/18  Yes Miguel Hockey, MD  albuterol (PROVENTIL) (2.5 MG/3ML) 0.083% nebulizer solution Inhale 2.5 mg into the lungs every 6 (six) hours as needed for wheezing or shortness of breath.  01/19/20  Yes [provider]  BREZTRI AEROSPHERE 160-9-4.8 MCG/ACT AERO Inhale 2 puffs into the lungs 2 (two) times daily. 05/17/22  Yes [provider]  clonazePAM (KLONOPIN) 1 MG tablet Take 1 mg by mouth 2 (two) times daily. 05/18/22  Yes [provider]  hydrochlorothiazide (HYDRODIURIL) 25 MG tablet Take 25 mg by mouth every morning. 07/02/20  Yes [provider]  ipratropium-albuterol (DUONEB) 0.5-2.5 (3) MG/3ML SOLN Take 3 mLs by nebulization every 4 (four) hours as needed. 12/16/19  Yes Miguel Marsh, Miguel Brow, MD  omeprazole (PRILOSEC) 40 MG capsule Take 1 capsule by mouth daily. 11/06/16  Yes [provider]  oxyCODONE (ROXICODONE) 15 MG immediate release tablet Take 15 mg by mouth 4 (four) times daily as needed for pain.  08/13/20  Yes [provider]  OXYGEN Inhale 3 L into the lungs continuous.    Yes [provider]  predniSONE (DELTASONE) 10 MG tablet Take 4 tabs for 3 days, then 3 tabs for 3 days, then 2 tabs for 3 days, then 1 tab for 3 days, then 1/2 tab for 4 days. Patient not taking: Reported on 06/08/2022 05/15/21   Miguel Phi, DO    Physical Exam: Vitals:   06/08/22 1208 06/08/22 1315 06/08/22 1330 06/08/22 1400  BP:  112/75 136/77 127/75  Pulse:  (!) 106  95 91  Resp:  (!) 43 (!) 22 17  Temp:      TempSrc:      SpO2: 93% 94% 100% 100%  Weight:      Height:       GENERAL:  A&O x 3, NAD, well developed, cooperative, follows commands HEENT: [place]/AT, No thrush, No icterus, No oral ulcers Neck:  No neck mass, No meningismus, soft, supple CV: RRR, no S3, no S4, no rub, no JVD Lungs:  diminished BS bilateral.  Scattered bilateral rales. Abd: soft/NT +BS, nondistended Ext: No edema, no lymphangitis, no cyanosis, no rashes Neuro:  CN II-XII intact, strength 4/5 in RUE, RLE, strength 4/5 LUE, LLE; sensation intact bilateral; no dysmetria; babinski equivocal  Data Reviewed: Data reviewed above in history  Assessment and Plan: chronic respiratory failure with hypoxia and hypercarbia -Initially on 8 L nasal cannula>>3.5L>>3L -Normally on 3 L , 24/7   COPD exacerbation -Start Pulmicort  -continue IV solumedrol -Continue duo nebs during hospitalization   Malignancy related pain -Miguel Marsh--receives monthly oxycodone 15 mg  #150 from Miguel Marsh -also receives clonazepam 1 mg, #60, last refill 05/18/22 -EKG--neg for ischemic changes -pt has had chronic Right lower chest, RUQ pain since his surgery 12/04/17 -10/20/19 CT chest--Scattered small pulmonary cysts. Diffuse bronchial wall thickening suggests airway predominant COPD. Fusiform bronchiectasis within both lower lobes, with areas of mucus plugging again noted in the periphery of the right lower lobe. 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, mediastinal mass excision and lymph node dissection with nerve sheath tumor resection 12/04/2017 at [facility] -follow up cardiothoracic at [facility]    Essential hypertension -BP remains controlled -continue HCTZ   Medication induced Hyperglycemia/Impaired glucose tolerance -12/12/19 hemoglobin A1c--6.4 -04/12/20 A1C--6.6 -05/02/21 A1C 6.0 -start novolog ISS -elevated CBG partly due to steroids -carb modified diet  Tobacco abuse -cessation discussed      Advance Care Planning: FULL  Consults: none  Family Communication: none  Severity of Illness: The appropriate patient status for this patient is INPATIENT. Inpatient status is judged to be reasonable and necessary in order to provide the required intensity of service to ensure the patient's safety. The patient's presenting symptoms, physical exam findings, and initial radiographic and laboratory data in the context of their chronic comorbidities is felt to place them at high risk for further clinical deterioration. Furthermore, it is not anticipated that the patient will be medically stable for discharge from the hospital within 2 midnights of admission.   * I certify that at the point of admission it is my clinical judgment that the patient will require inpatient hospital care spanning beyond 2 midnights from the point of admission due to high intensity of service, high risk for further deterioration and high frequency of surveillance required.*  Author: [name], MD 06/08/2022 2:39  PM  For on call review [email or web address].

## 2022-06-08 NOTE — Hospital Course (Signed)
61 year old male with a history of metastatic non-small cell lung carcinoma, chronic respiratory failure on 3 L, COPD, GERD, tobacco abuse, and hypertension presenting with 1 week history of shortness of breath that has significantly worsened over the past 24 hours.  The patient reports a chronic essentially nonproductive cough.  He denies any fevers, chills, chest pain, hemoptysis, nausea or vomiting or diarrhea, abdominal pain, dysuria, hematuria.  He continues to smoke about 5 cigarettes/week.  He denies any illicit drug use.  He has been using his nebulizer machine without much improvement. In the ED, the patient was afebrile and hemodynamically stable with oxygen saturation 94-98% on 3 L.  BMP showed sodium 141, potassium 3.8, bicarbonate 39, serum creatinine 0.68.  WBC 6.8, hemoglobin 13.6, platelets 202,000.  EKG shows sinus rhythm and nonspecific T wave changes.  Chest x-ray showed chronic interstitial prominence.  There is bibasilar atelectasis.  The patient was given Solu-Medrol and bronchodilators but continued to have shortness of breath.  He was admitted for further evaluation and treatment.

## 2022-06-08 NOTE — ED Notes (Signed)
Crackers, peanut butter and soda given to pt

## 2022-06-09 DIAGNOSIS — C3491 Malignant neoplasm of unspecified part of right bronchus or lung: Secondary | ICD-10-CM | POA: Diagnosis not present

## 2022-06-09 DIAGNOSIS — J441 Chronic obstructive pulmonary disease with (acute) exacerbation: Secondary | ICD-10-CM | POA: Diagnosis not present

## 2022-06-09 DIAGNOSIS — I10 Essential (primary) hypertension: Secondary | ICD-10-CM | POA: Diagnosis not present

## 2022-06-09 DIAGNOSIS — J9611 Chronic respiratory failure with hypoxia: Secondary | ICD-10-CM | POA: Diagnosis not present

## 2022-06-09 LAB — BASIC METABOLIC PANEL
Anion gap: 9 (ref 5–15)
BUN: 11 mg/dL (ref 8–23)
CO2: 38 mmol/L — ABNORMAL HIGH (ref 22–32)
Calcium: 9.7 mg/dL (ref 8.9–10.3)
Chloride: 93 mmol/L — ABNORMAL LOW (ref 98–111)
Creatinine, Ser: 0.68 mg/dL (ref 0.61–1.24)
GFR, Estimated: >60 mL/min (ref >60)
Glucose, Bld: 115 mg/dL — ABNORMAL HIGH (ref 70–99)
Potassium: 4.8 mmol/L (ref 3.5–5.1)
Sodium: 140 mmol/L (ref 135–145)

## 2022-06-09 LAB — MAGNESIUM: Magnesium: 2.2 mg/dL (ref 1.7–2.4)

## 2022-06-09 LAB — HEMOGLOBIN A1C
Hgb A1c MFr Bld: 5.2 % (ref 4.8–5.6)
Mean Plasma Glucose: 102.54 mg/dL

## 2022-06-09 LAB — PHOSPHORUS: Phosphorus: 4.6 mg/dL (ref 2.5–4.6)

## 2022-06-09 NOTE — Progress Notes (Signed)
  Transition of Care [facility]) Screening Note   Patient Details  Name:  WENZLICK Date of Birth: 12/11/1960   Transition of Care [facility]) CM/SW Contact:    Iona Beard, [place] Phone Number: 06/09/2022, 11:36 AM    Transition of Care Department [facility]) has reviewed patient and no TOC needs have been identified at this time. We will continue to monitor patient advancement through interdisciplinary progression rounds. If new patient transition needs arise, please place a TOC consult.

## 2022-06-09 NOTE — Progress Notes (Signed)
PROGRESS NOTE  ACEA YAGI OZH:[id number] DOB: February 17, 1961 DOA: 06/08/2022 PCP: Adaline Sill, NP  Brief History:  61 year old male with a history of metastatic non-small cell lung carcinoma, chronic respiratory failure on 3 L, COPD, GERD, tobacco abuse, and hypertension presenting with 1 week history of shortness of breath that has significantly worsened over the past 24 hours.  The patient reports a chronic essentially nonproductive cough.  He denies any fevers, chills, chest pain, hemoptysis, nausea or vomiting or diarrhea, abdominal pain, dysuria, hematuria.  He continues to smoke about 5 cigarettes/week.  He denies any illicit drug use.  He has been using his nebulizer machine without much improvement. In the ED, the patient was afebrile and hemodynamically stable with oxygen saturation 94-98% on 3 L.  BMP showed sodium 141, potassium 3.8, bicarbonate 39, serum creatinine 0.68.  WBC 6.8, hemoglobin 13.6, platelets 202,000.  EKG shows sinus rhythm and nonspecific T wave changes.  Chest x-ray showed chronic interstitial prominence.  There is bibasilar atelectasis.  The patient was given Solu-Medrol and bronchodilators but continued to have shortness of breath.  He was admitted for further evaluation and treatment.   Assessment/Plan: chronic respiratory failure with hypoxia and hypercarbia -Normally on 3 L , 24/7   COPD exacerbation -Conitnue Pulmicort  -continue IV solumedrol -Continue duo nebs during hospitalization   Malignancy related pain -PMP Aware queried--receives monthly oxycodone 15 mg  #150 from K. Georgina Quint, DNP -also receives clonazepam 1 mg, #60, last refill 05/18/22 -EKG--neg for ischemic changes -pt has had chronic Right lower chest, RUQ pain since his surgery 12/04/17 -10/20/19 CT chest--Scattered small pulmonary cysts. Diffuse bronchial wall thickening suggests airway predominant COPD. Fusiform bronchiectasis within both lower lobes, with areas of mucus  plugging again noted in the periphery of the right lower lobe. 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, mediastinal mass excision and lymph node dissection with nerve sheath tumor resection 12/04/2017 at [facility] -follow up cardiothoracic at [facility]   Essential hypertension -BP remains controlled -continue HCTZ   Medication induced Hyperglycemia/Impaired glucose tolerance -12/12/19 hemoglobin A1c--6.4 -04/12/20 A1C--6.6 -05/02/21 A1C 6.0 -start novolog ISS -elevated CBG partly due to steroids -carb modified diet   Tobacco abuse -cessation discussed     Family Communication:   no Family at bedside  Consultants:  none  Code Status:  FULL   DVT Prophylaxis:  [place] Lovenox   Procedures: As Listed in Progress Note Above  Antibiotics: None     Subjective: He states he is breathing better.  Denies f/c, cp, n/v/d, abd pain  Objective: Vitals:   06/09/22 0204 06/09/22 0608 06/09/22 0933 06/09/22 1424  BP: 118/77 (!) 145/85  136/88  Pulse: 73 81  93  Resp: 19 20  20   Temp: 97.8 F (36.6 C) 97.7 F (36.5 C)  98.3 F (36.8 C)  TempSrc: Oral Oral  Oral  SpO2: 96% 99% (!) 88% 94%  Weight:      Height:        Intake/Output Summary (Last 24 hours) at 06/09/2022 1656 Last data filed at 06/09/2022 1016 Gross per 24 hour  Intake 960 ml  Output 950 ml  Net 10 ml   Weight change:  Exam:  General:  Pt is alert, follows commands appropriately, not in acute distress HEENT: No icterus, No thrush, No neck mass, [place]/AT Cardiovascular: RRR, S1/S2, no rubs, no gallops Respiratory: diminished BS bilateral.  Bibasilar crackles with minimal wheeze Abdomen: Soft/+BS, non tender, non  distended, no guarding Extremities: No edema, No lymphangitis, No petechiae, No rashes, no synovitis   Data Reviewed: I have personally reviewed following labs and imaging studies Basic Metabolic Panel: Recent Labs  Lab 06/08/22 1022 06/09/22 0615  NA 141 140  K 3.8 4.8  CL 96* 93*  CO2 39* 38*  GLUCOSE 109* 115*  BUN 7* 11  CREATININE 0.68 0.68  CALCIUM 9.0 9.7  MG  --  2.2  PHOS  --  4.6   Liver Function Tests: No results for input(s): "AST", "ALT", "ALKPHOS", "BILITOT", "PROT", "ALBUMIN" in the last 168 hours. No results for input(s): "LIPASE", "AMYLASE" in the last 168 hours. No results for input(s): "AMMONIA" in the last 168 hours. Coagulation Profile: No results for input(s): "INR", "PROTIME" in the last 168 hours. CBC: Recent Labs  Lab 06/08/22 1022  WBC 6.8  NEUTROABS 4.4  HGB 13.6  HCT 43.7  MCV 94.8  PLT 202   Cardiac Enzymes: No results for input(s): "CKTOTAL", "CKMB", "CKMBINDEX", "TROPONINI" in the last 168 hours. BNP: Invalid input(s): "POCBNP" CBG: No results for input(s): "GLUCAP" in the last 168 hours. HbA1C: Recent Labs    06/09/22 0615  HGBA1C 5.2   Urine analysis:    Component Value Date/Time   COLORURINE YELLOW 01/22/2020 1251   APPEARANCEUR CLEAR 01/22/2020 1251   LABSPEC 1.017 01/22/2020 1251   PHURINE 6.0 01/22/2020 1251   GLUCOSEU NEGATIVE 01/22/2020 1251   HGBUR NEGATIVE 01/22/2020 1251   BILIRUBINUR NEGATIVE 01/22/2020 1251   KETONESUR NEGATIVE 01/22/2020 1251   PROTEINUR NEGATIVE 01/22/2020 1251   UROBILINOGEN 0.2 10/21/2013 0200   NITRITE NEGATIVE 01/22/2020 1251   LEUKOCYTESUR NEGATIVE 01/22/2020 1251   Sepsis Labs: @LABRCNTIP (procalcitonin:4,lacticidven:4) ) Recent Results (from the past 240 hour(s))  Resp Panel by RT-PCR (Flu A&B, Covid) Anterior Nasal Swab     Status: None   Collection Time: 06/08/22 10:52 AM   Specimen: Anterior Nasal Swab  Result Value Ref Range Status   SARS Coronavirus 2 by RT PCR NEGATIVE  NEGATIVE Final    Comment: (NOTE) SARS-CoV-2 target nucleic acids are NOT DETECTED.  The SARS-CoV-2 RNA is generally detectable in upper respiratory specimens during the acute phase of infection. The lowest concentration of SARS-CoV-2 viral copies this assay can detect is 138 copies/mL. A negative result does not preclude SARS-Cov-2 infection and should not be used as the sole basis for treatment or other patient management decisions. A negative result may occur with  improper specimen collection/handling, submission of specimen other than nasopharyngeal swab, presence of viral mutation(s) within the areas targeted by this assay, and inadequate number of viral copies(<138 copies/mL). A negative result must be combined with clinical observations, patient history, and epidemiological information. The expected result is Negative.  Fact Sheet for Patients:  EntrepreneurPulse.com.au  Fact Sheet for Healthcare Providers:  IncredibleEmployment.be  This test is no t yet approved or cleared by the [place] FDA and  has been authorized for detection and/or diagnosis of SARS-CoV-2 by FDA under an Emergency Use Authorization (EUA). This EUA will remain  in effect (meaning this test can be used) for the duration of the COVID-19 declaration under Section 564(b)(1) of the Act, 21 U.S.C.section 360bbb-3(b)(1), unless the authorization is terminated  or revoked sooner.  Influenza A by PCR NEGATIVE NEGATIVE Final   Influenza B by PCR NEGATIVE NEGATIVE Final    Comment: (NOTE) The Xpert Xpress SARS-CoV-2/FLU/RSV plus assay is intended as an aid in the diagnosis of influenza from Nasopharyngeal swab specimens and should not be used as a sole basis for treatment. Nasal washings and aspirates are unacceptable for Xpert Xpress SARS-CoV-2/FLU/RSV testing.  Fact Sheet for Patients: EntrepreneurPulse.com.au  Fact Sheet for Healthcare  Providers: IncredibleEmployment.be  This test is not yet approved or cleared by the [place] FDA and has been authorized for detection and/or diagnosis of SARS-CoV-2 by FDA under an Emergency Use Authorization (EUA). This EUA will remain in effect (meaning this test can be used) for the duration of the COVID-19 declaration under Section 564(b)(1) of the Act, 21 U.S.C. section 360bbb-3(b)(1), unless the authorization is terminated or revoked.  Performed at [facility], [address], [place], [place] [address]      Scheduled Meds:  budesonide (PULMICORT) nebulizer solution  0.5 mg Nebulization BID   clonazePAM  1 mg Oral BID   enoxaparin (LOVENOX) injection  40 mg Subcutaneous Q24H   hydrochlorothiazide  25 mg Oral q morning   methylPREDNISolone (SOLU-MEDROL) injection  60 mg Intravenous Q12H   pantoprazole  40 mg Oral Daily   Continuous Infusions:  Procedures/Studies: DG Chest Portable 1 View  Result Date: 06/08/2022 CLINICAL DATA:  Dyspnea. EXAM: PORTABLE CHEST 1 VIEW COMPARISON:  Chest x-ray 05/12/2021. FINDINGS: The heart size and mediastinal contours are within normal limits. No consolidation. Similar mild scarring. No visible pleural effusions or pneumothorax. No acute osseous abnormality. IMPRESSION: No active disease. Electronically Signed   By: Margaretha Sheffield M.D.   On: 06/08/2022 10:36    [name], DO  [facility]  If 7PM-7AM, please contact night-coverage [email or web address] Password [facility] 06/09/2022, 4:56 PM   LOS: 1 day

## 2022-06-09 NOTE — Assessment & Plan Note (Signed)
-  Normallyon3L,24/7

## 2022-06-09 NOTE — Assessment & Plan Note (Signed)
Cessation discussed

## 2022-06-09 NOTE — Assessment & Plan Note (Signed)
-  StartPulmicort -continue IV solumedrol -Continue duo nebsduring hospitalization

## 2022-06-09 NOTE — Care Management Important Message (Signed)
Important Message  Patient Details  Name: Miguel Marsh MRN: [id number] Date of Birth: [birth year]   Medicare Important Message Given:  N/A - LOS <3 / Initial given by admissions     Tommy Medal 06/09/2022, 12:16 PM

## 2022-06-10 DIAGNOSIS — J9612 Chronic respiratory failure with hypercapnia: Secondary | ICD-10-CM | POA: Diagnosis not present

## 2022-06-10 DIAGNOSIS — J9611 Chronic respiratory failure with hypoxia: Secondary | ICD-10-CM | POA: Diagnosis not present

## 2022-06-10 DIAGNOSIS — J441 Chronic obstructive pulmonary disease with (acute) exacerbation: Secondary | ICD-10-CM | POA: Diagnosis not present

## 2022-06-10 MED ORDER — IPRATROPIUM-ALBUTEROL 0.5-2.5 (3) MG/3ML IN SOLN
3.0000 mL | RESPIRATORY_TRACT | Status: DC | PRN
Start: 2022-06-10 — End: 2022-06-11

## 2022-06-10 MED ORDER — PREDNISONE 20 MG PO TABS
60.0000 mg | ORAL_TABLET | Freq: Every day | ORAL | Status: DC
  Administered 2022-06-10 – 2022-06-11 (×2): 60 mg via ORAL
  Filled 2022-06-10 (×2): qty 3

## 2022-06-10 NOTE — Plan of Care (Signed)
  Problem: Education: Goal: Knowledge of General Education information will improve Description: Including pain rating scale, medication(s)/side effects and non-pharmacologic comfort measures Outcome: Progressing   Problem: Clinical Measurements: Goal: Respiratory complications will improve Outcome: Progressing   Problem: Nutrition: Goal: Adequate nutrition will be maintained Outcome: Progressing   Problem: Coping: Goal: Level of anxiety will decrease Outcome: Progressing   Problem: Pain Managment: Goal: General experience of comfort will improve Outcome: Progressing   Problem: Safety: Goal: Ability to remain free from injury will improve Outcome: Progressing

## 2022-06-10 NOTE — Progress Notes (Signed)
Progress Note   Patient: Miguel Marsh OVF:[id number] DOB: 1961-01-22 DOA: 06/08/2022     2 DOS: the patient was seen and examined on 06/10/2022   Brief hospital course: 61 year old male with a history of metastatic non-small cell lung carcinoma, chronic respiratory failure on 3 L, COPD, GERD, tobacco abuse, and hypertension presenting with 1 week history of shortness of breath that has significantly worsened over the past 24 hours.  The patient reports a chronic essentially nonproductive cough.  He denies any fevers, chills, chest pain, hemoptysis, nausea or vomiting or diarrhea, abdominal pain, dysuria, hematuria.  He continues to smoke about 5 cigarettes/week.  He denies any illicit drug use.  He has been using his nebulizer machine without much improvement. In the ED, the patient was afebrile and hemodynamically stable with oxygen saturation 94-98% on 3 L.  BMP showed sodium 141, potassium 3.8, bicarbonate 39, serum creatinine 0.68.  WBC 6.8, hemoglobin 13.6, platelets 202,000.  EKG shows sinus rhythm and nonspecific T wave changes.  Chest x-ray showed chronic interstitial prominence.  There is bibasilar atelectasis.  The patient was given Solu-Medrol and bronchodilators but continued to have shortness of breath.  He was admitted for further evaluation and treatment.  Assessment and Plan: Chronic respiratory failure with hypoxia and hypercarbia -Normally on 3 L , 24/7   COPD exacerbation -Conitnue Pulmicort  -continue IV solumedrol -> prednisone 60mg  po qday (8/12) -Continue duo nebs during hospitalization   Malignancy related pain -PMP Aware queried--receives monthly oxycodone 15 mg  #150 from K. Georgina Quint, DNP -also receives clonazepam 1 mg, #60, last refill 05/18/22 -EKG--neg for ischemic changes -pt has had chronic Right lower chest, RUQ pain since his surgery 12/04/17 -10/20/19 CT chest--Scattered small pulmonary cysts. Diffuse bronchial wall thickening suggests airway predominant COPD.  Fusiform bronchiectasis within both lower lobes, with areas of mucus plugging again noted in the periphery of the right lower lobe. 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   Essential hypertension -BP remains controlled -continue HCTZ   Medication induced Hyperglycemia/Impaired glucose tolerance -12/12/19 hemoglobin A1c--6.4 -04/12/20 A1C--6.6 -05/02/21 A1C 6.0 -start novolog ISS -elevated CBG partly due to steroids -carb modified diet   Tobacco abuse -cessation discussed        Subjective: pt states still dyspneic.  Pt states about 30% better.  Pt denies fever, chills, cp, palp, n/v, abd pain, diarrhea, brbpr.    Physical Exam: Vitals:   06/09/22 1424 06/09/22 1956 06/10/22 0515 06/10/22 1435  BP: 136/88  117/64 122/63  Pulse: 93 71 77 87  Resp: 20 18 18 18   Temp: 98.3 F (36.8 C)  98.7 F (37.1 C) 97.7 F (36.5 C)  TempSrc: Oral   Oral  SpO2: 94% 92% 100% 93%  Weight:      Height:       Heent: anicteric Neck: no jvd, no bruit Heart: rrr s1, s2, no m/g/r Lung: tight, prolonged exp phase, + bilateral mild exp wheezing, no crackles Abd: soft, nt, nd, +bs Ext: no c/c/e Skin: no rash Neuro: nonfocal  Data Reviewed:  Family Communication: w family (brother) 8/12 in room  Consultants:  none  Code Status:  FULL    DVT Prophylaxis:  [place] Lovenox  Disposition: Status is: Inpatient  Planned Discharge Destination:  Home   Time spent: 30 minutes  Author: Jani Gravel, MD 06/10/2022 2:44 PM  For on call review [email or web address].

## 2022-06-11 DIAGNOSIS — C3491 Malignant neoplasm of unspecified part of right bronchus or lung: Secondary | ICD-10-CM | POA: Diagnosis not present

## 2022-06-11 DIAGNOSIS — J9611 Chronic respiratory failure with hypoxia: Secondary | ICD-10-CM | POA: Diagnosis not present

## 2022-06-11 DIAGNOSIS — J441 Chronic obstructive pulmonary disease with (acute) exacerbation: Secondary | ICD-10-CM | POA: Diagnosis not present

## 2022-06-11 DIAGNOSIS — I10 Essential (primary) hypertension: Secondary | ICD-10-CM | POA: Diagnosis not present

## 2022-06-11 MED ORDER — PREDNISONE 10 MG PO TABS: ORAL_TABLET | ORAL | 0 refills | Status: DC

## 2022-06-11 NOTE — Discharge Instructions (Addendum)
Please call your Primary Care Physician to follow up regarding your Copd in 1 week

## 2022-06-11 NOTE — Discharge Summary (Signed)
Physician Discharge Summary   Patient: Miguel Marsh MRN: [id number] DOB: [birth year]  Admit date:     06/08/2022  Discharge date: 06/11/22  Discharge Physician: Jani Gravel   PCP: Adaline Sill, NP   Recommendations at discharge:   Copd Exacerbation Acute on Chronic Respiratory Failure with hypoxia and hypercapnea Copd on home o2 3L [place] Start prednisone taper 60mg  po qday x2 days then 50mg  po qday x 2 days then 40mg  po qday x 2 days then 30mg  po qday x 2 days then 20mg  po qday x 2 days then 10mg  po qday x 2 days.  Cont Breztri Aerosphere 2puff bid Continue Albuterol HFA 2puff q6h prn / Albuterol neb 1 po q6h prn  Please f/u with your PCP in 1 week  Tobacco use Pt counselled on smoking cessation and avoiding 2nd hand smoke  Hypertension Cont Hydrochlorothiazide 25mg  po qday Check bmp in 1 week with pcp  Anxiety Cont Clonazepam 1mg  po bid  Non-small cell Lung Cancer, metastatic Please f/u with oncology  Chronic pain , Nerve Sheath Tumor Cont Oxycodone    Discharge Diagnoses: Principal Problem:   COPD exacerbation ([place]) Active Problems:   Tobacco use disorder   Essential hypertension   Non-small cell cancer of right lung (in remission)   Chronic respiratory failure with hypoxia and hypercapnia [facility])   Hospital Course: 61 year old male with a history of metastatic non-small cell lung carcinoma, chronic respiratory failure on 3 L, COPD, GERD, tobacco abuse, and hypertension presenting with 1 week history of shortness of breath that has significantly worsened over the past 24 hours.  The patient reports a chronic essentially nonproductive cough.  He denies any fevers, chills, chest pain, hemoptysis, nausea or vomiting or diarrhea, abdominal pain, dysuria, hematuria.  He continues to smoke about 5 cigarettes/week.  He denies any illicit drug use.  He has been using his nebulizer machine without much improvement. In the ED, the patient was afebrile and hemodynamically  stable with oxygen saturation 94-98% on 3 L.  BMP showed sodium 141, potassium 3.8, bicarbonate 39, serum creatinine 0.68.  WBC 6.8, hemoglobin 13.6, platelets 202,000.  EKG shows sinus rhythm and nonspecific T wave changes.  Chest x-ray showed chronic interstitial prominence.  There is bibasilar atelectasis.  The patient was given Solu-Medrol and bronchodilators but continued to have shortness of breath.  He was admitted for further evaluation and treatment.  Assessment and Plan:  Chronic respiratory failure with hypoxia and hypercarbia -Normally on 3 L , 24/7   Copd Exacerbation Acute on Chronic Respiratory Failure with hypoxia and hypercapnea Copd on home o2 3L [place] Start prednisone taper 60mg  po qday x2 days then 50mg  po qday x 2 days then 40mg  po qday x 2 days then 30mg  po qday x 2 days then 20mg  po qday x 2 days then 10mg  po qday x 2 days.  Cont Breztri Aerosphere 2puff bid Continue Albuterol HFA 2puff q6h prn / Albuterol neb 1 po q6h prn  Please f/u with your PCP in 1 week   Malignancy related pain -PMP Aware queried--receives monthly oxycodone 15 mg  #150 from K. Georgina Quint, DNP -also receives clonazepam 1 mg, #60, last refill 05/18/22 -EKG--neg for ischemic changes -pt has had chronic Right lower chest, RUQ pain since his surgery 12/04/17 -10/20/19 CT chest--Scattered small pulmonary cysts. Diffuse bronchial wall thickening suggests airway predominant COPD. Fusiform bronchiectasis within both lower lobes, with areas of mucus plugging again noted in the periphery of the right lower lobe. 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, mediastinal mass excision and lymph node dissection with nerve  sheath tumor resection 12/04/2017 at [facility] -follow up cardiothoracic at [facility]   Essential hypertension -BP remains controlled -continue HCTZ   Medication induced Hyperglycemia/Impaired glucose tolerance -12/12/19 hemoglobin A1c--6.4 -04/12/20 A1C--6.6 -05/02/21 A1C 6.0 -start novolog ISS -elevated CBG partly due to steroids -carb modified diet   Tobacco abuse -cessation discussed   Consultants: None Procedures performed: None Disposition: Home Diet recommendation:  Carb Modified  DISCHARGE MEDICATION: Allergies as of 06/11/2022   No Known Allergies      Medication List     STOP taking these medications    ipratropium-albuterol 0.5-2.5 (3) MG/3ML Soln Commonly known as: DUONEB       TAKE these medications    albuterol 108 (90 Base) MCG/ACT inhaler Commonly known as: VENTOLIN HFA Inhale 2 puffs into the lungs every 6 (six) hours as needed for wheezing.   albuterol (2.5 MG/3ML) 0.083% nebulizer solution Commonly known as: PROVENTIL Inhale 2.5 mg into the lungs every 6 (six) hours as needed for wheezing or shortness of breath.   Breztri Aerosphere 160-9-4.8 MCG/ACT Aero Generic drug: Budeson-Glycopyrrol-Formoterol Inhale 2 puffs into the lungs 2 (two) times daily.   clonazePAM 1 MG tablet Commonly known as: KLONOPIN Take 1 mg by mouth 2 (two) times daily.   hydrochlorothiazide 25 MG tablet Commonly known as: HYDRODIURIL Take 25 mg by mouth every morning.   omeprazole 40 MG capsule Commonly known as: PRILOSEC Take 1 capsule by mouth daily.   oxyCODONE 15 MG immediate release tablet Commonly known as: ROXICODONE Take 15 mg by mouth 4 (four) times daily as needed for pain.   OXYGEN Inhale 3 L into the lungs continuous.   predniSONE 10 MG tablet Commonly known as: DELTASONE 60mg  po qday x 2 days, then 50mg  po qday x 2 days, then 40mg  po qday x 2 days then 30mg  po qday x 2 days then 20mg  po qday x 2 days then 10mg  po qday x 2 days Start taking on: June 12, 2022 What changed: additional instructions        Follow-up Information     Adaline Sill, NP Follow up in 1 week(s).   Specialty: Internal Medicine Contact information: [address] [place] [address] [phone]                Discharge Exam: Danley Danker Weights   06/08/22 0945 06/08/22 1700  Weight: 81.6 kg 81.3 kg   Heent: anicteric Neck: no jvd Heart: rrr s1, s2, no m/g/r Lung: slight prolonged exp phase, good air movement, slight exp wheeze, no crackles Abd: soft, nt, nd, +bs Ext: no c/c/e Neuro: nonfocal Msk: pt able to ambulate up and down hall  Condition at discharge: Improved, stable  The results of significant diagnostics from this hospitalization (including imaging, microbiology, ancillary and laboratory) are listed below for reference.   Imaging Studies: DG Chest Portable 1 View  Result Date: 06/08/2022 CLINICAL DATA:  Dyspnea. EXAM: PORTABLE CHEST 1 VIEW COMPARISON:  Chest x-ray 05/12/2021. FINDINGS: The heart size and mediastinal contours are within normal limits. No consolidation. Similar mild scarring. No visible pleural effusions or pneumothorax. No acute osseous abnormality. IMPRESSION: No active disease. Electronically Signed  By: Margaretha Sheffield M.D.   On: 06/08/2022 10:36    Microbiology: Results for orders placed or performed during the hospital encounter of 06/08/22  Resp Panel by RT-PCR (Flu A&B, Covid) Anterior Nasal Swab     Status: None   Collection Time: 06/08/22 10:52 AM   Specimen: Anterior Nasal Swab  Result Value Ref Range Status   SARS Coronavirus 2 by RT PCR NEGATIVE NEGATIVE Final    Comment: (NOTE) SARS-CoV-2 target nucleic acids are NOT DETECTED.  The SARS-CoV-2 RNA is generally detectable in upper respiratory specimens during the acute phase of infection. The lowest concentration of SARS-CoV-2 viral copies this assay can detect is 138 copies/mL. A negative result does not preclude SARS-Cov-2 infection and  should not be used as the sole basis for treatment or other patient management decisions. A negative result may occur with  improper specimen collection/handling, submission of specimen other than nasopharyngeal swab, presence of viral mutation(s) within the areas targeted by this assay, and inadequate number of viral copies(<138 copies/mL). A negative result must be combined with clinical observations, patient history, and epidemiological information. The expected result is Negative.  Fact Sheet for Patients:  EntrepreneurPulse.com.au  Fact Sheet for Healthcare Providers:  IncredibleEmployment.be  This test is no t yet approved or cleared by the [place] FDA and  has been authorized for detection and/or diagnosis of SARS-CoV-2 by FDA under an Emergency Use Authorization (EUA). This EUA will remain  in effect (meaning this test can be used) for the duration of the COVID-19 declaration under Section 564(b)(1) of the Act, 21 U.S.C.section 360bbb-3(b)(1), unless the authorization is terminated  or revoked sooner.       Influenza A by PCR NEGATIVE NEGATIVE Final   Influenza B by PCR NEGATIVE NEGATIVE Final    Comment: (NOTE) The Xpert Xpress SARS-CoV-2/FLU/RSV plus assay is intended as an aid in the diagnosis of influenza from Nasopharyngeal swab specimens and should not be used as a sole basis for treatment. Nasal washings and aspirates are unacceptable for Xpert Xpress SARS-CoV-2/FLU/RSV testing.  Fact Sheet for Patients: EntrepreneurPulse.com.au  Fact Sheet for Healthcare Providers: IncredibleEmployment.be  This test is not yet approved or cleared by the [place] FDA and has been authorized for detection and/or diagnosis of SARS-CoV-2 by FDA under an Emergency Use Authorization (EUA). This EUA will remain in effect (meaning this test can be used) for the duration of the COVID-19 declaration  under Section 564(b)(1) of the Act, 21 U.S.C. section 360bbb-3(b)(1), unless the authorization is terminated or revoked.  Performed at [facility], [address], [place], [place] [address]     Labs: CBC: Recent Labs  Lab 06/08/22 1022  WBC 6.8  NEUTROABS 4.4  HGB 13.6  HCT 43.7  MCV 94.8  PLT 892   Basic Metabolic Panel: Recent Labs  Lab 06/08/22 1022 06/09/22 0615  NA 141 140  K 3.8 4.8  CL 96* 93*  CO2 39* 38*  GLUCOSE 109* 115*  BUN 7* 11  CREATININE 0.68 0.68  CALCIUM 9.0 9.7  MG  --  2.2  PHOS  --  4.6   Liver Function Tests: No results for input(s): "AST", "ALT", "ALKPHOS", "BILITOT", "PROT", "ALBUMIN" in the last 168 hours. CBG: No results for input(s): "GLUCAP" in the last 168 hours.  Discharge time spent: 40 minutes.  Signed: Jani Gravel, MD [facility] 06/11/2022

## 2022-06-11 NOTE — Progress Notes (Signed)
Nsg Discharge Note  Admit Date:  06/08/2022 Discharge date: 06/11/2022   Sharon Seller to be D/C'd Home per MD order.  AVS completed.  Copy for chart, and copy for patient signed, and dated. Patient/caregiver able to verbalize understanding. IV removed and discharge paper work given and reviewed with patient   Discharge Medication: Allergies as of 06/11/2022   No Known Allergies      Medication List     STOP taking these medications    ipratropium-albuterol 0.5-2.5 (3) MG/3ML Soln Commonly known as: DUONEB       TAKE these medications    albuterol 108 (90 Base) MCG/ACT inhaler Commonly known as: VENTOLIN HFA Inhale 2 puffs into the lungs every 6 (six) hours as needed for wheezing.   albuterol (2.5 MG/3ML) 0.083% nebulizer solution Commonly known as: PROVENTIL Inhale 2.5 mg into the lungs every 6 (six) hours as needed for wheezing or shortness of breath.   Breztri Aerosphere 160-9-4.8 MCG/ACT Aero Generic drug: Budeson-Glycopyrrol-Formoterol Inhale 2 puffs into the lungs 2 (two) times daily.   clonazePAM 1 MG tablet Commonly known as: KLONOPIN Take 1 mg by mouth 2 (two) times daily.   hydrochlorothiazide 25 MG tablet Commonly known as: HYDRODIURIL Take 25 mg by mouth every morning.   omeprazole 40 MG capsule Commonly known as: PRILOSEC Take 1 capsule by mouth daily.   oxyCODONE 15 MG immediate release tablet Commonly known as: ROXICODONE Take 15 mg by mouth 4 (four) times daily as needed for pain.   OXYGEN Inhale 3 L into the lungs continuous.   predniSONE 10 MG tablet Commonly known as: DELTASONE 60mg  po qday x 2 days, then 50mg  po qday x 2 days, then 40mg  po qday x 2 days then 30mg  po qday x 2 days then 20mg  po qday x 2 days then 10mg  po qday x 2 days Start taking on: June 12, 2022 What changed: additional instructions        Discharge Assessment: Vitals:   06/11/22 0421 06/11/22 0953  BP: 135/84   Pulse: 75   Resp: 19   Temp: 97.7 F (36.5  C)   SpO2: 97% 91%   Skin clean, dry and intact without evidence of skin break down, no evidence of skin tears noted. IV catheter discontinued intact. Site without signs and symptoms of complications - no redness or edema noted at insertion site, patient denies c/o pain - only slight tenderness at site.  Dressing with slight pressure applied.  D/c Instructions-Education: Discharge instructions given to patient/family with verbalized understanding. D/c education completed with patient/family including follow up instructions, medication list, d/c activities limitations if indicated, with other d/c instructions as indicated by MD - patient able to verbalize understanding, all questions fully answered. Patient instructed to return to ED, call 911, or call MD for any changes in condition.  Patient escorted via [place], and D/C home via private auto.  Zenaida Deed, RN 06/11/2022 1:33 PM

## 2023-02-01 ENCOUNTER — Other Ambulatory Visit: Payer: Self-pay

## 2023-02-01 ENCOUNTER — Emergency Department (HOSPITAL_COMMUNITY): Payer: 59

## 2023-02-01 ENCOUNTER — Encounter (HOSPITAL_COMMUNITY): Payer: Self-pay

## 2023-02-01 ENCOUNTER — Inpatient Hospital Stay (HOSPITAL_COMMUNITY)
Admission: EM | Admit: 2023-02-01 | Discharge: 2023-02-08 | DRG: 190 | Disposition: A | Discharge status: Home / Self Care | Payer: 59 | Attending: Family Medicine | Admitting: Family Medicine

## 2023-02-01 DIAGNOSIS — H538 Other visual disturbances: Secondary | ICD-10-CM | POA: Diagnosis present

## 2023-02-01 DIAGNOSIS — E440 Moderate protein-calorie malnutrition: Secondary | ICD-10-CM | POA: Diagnosis present

## 2023-02-01 DIAGNOSIS — J441 Chronic obstructive pulmonary disease with (acute) exacerbation: Secondary | ICD-10-CM | POA: Diagnosis not present

## 2023-02-01 DIAGNOSIS — I10 Essential (primary) hypertension: Secondary | ICD-10-CM | POA: Diagnosis not present

## 2023-02-01 DIAGNOSIS — Z85118 Personal history of other malignant neoplasm of bronchus and lung: Secondary | ICD-10-CM | POA: Diagnosis not present

## 2023-02-01 DIAGNOSIS — E873 Alkalosis: Secondary | ICD-10-CM | POA: Diagnosis present

## 2023-02-01 DIAGNOSIS — Z87891 Personal history of nicotine dependence: Secondary | ICD-10-CM | POA: Diagnosis not present

## 2023-02-01 DIAGNOSIS — Z6825 Body mass index (BMI) 25.0-25.9, adult: Secondary | ICD-10-CM

## 2023-02-01 DIAGNOSIS — Z1152 Encounter for screening for COVID-19: Secondary | ICD-10-CM

## 2023-02-01 DIAGNOSIS — K219 Gastro-esophageal reflux disease without esophagitis: Secondary | ICD-10-CM | POA: Diagnosis present

## 2023-02-01 DIAGNOSIS — F17200 Nicotine dependence, unspecified, uncomplicated: Secondary | ICD-10-CM | POA: Diagnosis present

## 2023-02-01 DIAGNOSIS — G9341 Metabolic encephalopathy: Secondary | ICD-10-CM | POA: Diagnosis not present

## 2023-02-01 DIAGNOSIS — Z96649 Presence of unspecified artificial hip joint: Secondary | ICD-10-CM | POA: Diagnosis present

## 2023-02-01 DIAGNOSIS — J9621 Acute and chronic respiratory failure with hypoxia: Secondary | ICD-10-CM | POA: Diagnosis present

## 2023-02-01 DIAGNOSIS — J9622 Acute and chronic respiratory failure with hypercapnia: Secondary | ICD-10-CM

## 2023-02-01 DIAGNOSIS — Z79899 Other long term (current) drug therapy: Secondary | ICD-10-CM | POA: Diagnosis not present

## 2023-02-01 DIAGNOSIS — Z9981 Dependence on supplemental oxygen: Secondary | ICD-10-CM

## 2023-02-01 LAB — BLOOD GAS, VENOUS
Drawn by: 4237
O2 Saturation: 96.4 %
Patient temperature: 36.7
pH, Ven: 7.19 — CL (ref 7.25–7.43)
pO2, Ven: 68 mmHg — ABNORMAL HIGH (ref 32–45)

## 2023-02-01 LAB — BLOOD GAS, ARTERIAL
Acid-Base Excess: 28.8 mmol/L — ABNORMAL HIGH (ref 0.0–2.0)
Acid-Base Excess: 27.1 mmol/L — ABNORMAL HIGH (ref 0.0–2.0)
Bicarbonate: 64.2 mmol/L — ABNORMAL HIGH (ref 20.0–28.0)
Bicarbonate: 61 mmol/L — ABNORMAL HIGH (ref 20.0–28.0)
Drawn by: 22766
Drawn by: 22766
O2 Saturation: 94.3 %
O2 Saturation: 85 %
Patient temperature: 36.7
Patient temperature: 36.7
pCO2 arterial: >123 mmHg (ref 32–48)
pCO2 arterial: 123 mmHg (ref 32–48)
pH, Arterial: 7.26 — ABNORMAL LOW (ref 7.35–7.45)
pH, Arterial: 7.3 — ABNORMAL LOW (ref 7.35–7.45)
pO2, Arterial: 59 mmHg — ABNORMAL LOW (ref 83–108)
pO2, Arterial: 44 mmHg — ABNORMAL LOW (ref 83–108)

## 2023-02-01 LAB — BASIC METABOLIC PANEL
BUN: 7 mg/dL — ABNORMAL LOW (ref 8–23)
CO2: >45 mmol/L — ABNORMAL HIGH (ref 22–32)
Calcium: 8.4 mg/dL — ABNORMAL LOW (ref 8.9–10.3)
Chloride: 84 mmol/L — ABNORMAL LOW (ref 98–111)
Creatinine, Ser: 0.57 mg/dL — ABNORMAL LOW (ref 0.61–1.24)
GFR, Estimated: >60 mL/min (ref >60)
Glucose, Bld: 130 mg/dL — ABNORMAL HIGH (ref 70–99)
Potassium: 4.1 mmol/L (ref 3.5–5.1)
Sodium: 138 mmol/L (ref 135–145)

## 2023-02-01 LAB — RESP PANEL BY RT-PCR (RSV, FLU A&B, COVID)  RVPGX2
Influenza A by PCR: NEGATIVE
Influenza B by PCR: NEGATIVE
Resp Syncytial Virus by PCR: NEGATIVE
SARS Coronavirus 2 by RT PCR: NEGATIVE

## 2023-02-01 LAB — CBC
HCT: 44.1 % (ref 39.0–52.0)
Hemoglobin: 12.6 g/dL — ABNORMAL LOW (ref 13.0–17.0)
MCH: 30.4 pg (ref 26.0–34.0)
MCHC: 28.6 g/dL — ABNORMAL LOW (ref 30.0–36.0)
MCV: 106.3 fL — ABNORMAL HIGH (ref 80.0–100.0)
Platelets: 173 10*3/uL (ref 150–400)
RBC: 4.15 MIL/uL — ABNORMAL LOW (ref 4.22–5.81)
RDW: 12.5 % (ref 11.5–15.5)
WBC: 6 10*3/uL (ref 4.0–10.5)
nRBC: 0 % (ref 0.0–0.2)

## 2023-02-01 MED ORDER — LACTATED RINGERS IV BOLUS
500.0000 mL | Freq: Once | INTRAVENOUS | Status: AC
  Administered 2023-02-01: 500 mL via INTRAVENOUS

## 2023-02-01 MED ORDER — ACETAMINOPHEN 325 MG PO TABS: 650.0000 mg | ORAL_TABLET | Freq: Four times a day (QID) | ORAL | Status: DC | PRN

## 2023-02-01 MED ORDER — CHLORHEXIDINE GLUCONATE CLOTH 2 % EX PADS
6.0000 | MEDICATED_PAD | Freq: Every day | CUTANEOUS | Status: DC
  Administered 2023-02-01 (×2): 6 via TOPICAL

## 2023-02-01 MED ORDER — ONDANSETRON HCL 4 MG/2ML IJ SOLN
4.0000 mg | Freq: Four times a day (QID) | INTRAMUSCULAR | Status: DC | PRN
  Administered 2023-02-04: 4 mg via INTRAVENOUS
  Filled 2023-02-01: qty 2

## 2023-02-01 MED ORDER — ALBUTEROL SULFATE (2.5 MG/3ML) 0.083% IN NEBU
10.0000 mg/h | INHALATION_SOLUTION | Freq: Once | RESPIRATORY_TRACT | Status: AC
  Administered 2023-02-01: 10 mg/h via RESPIRATORY_TRACT
  Filled 2023-02-01 (×2): qty 3

## 2023-02-01 MED ORDER — IPRATROPIUM-ALBUTEROL 0.5-2.5 (3) MG/3ML IN SOLN: 3.0000 mL | Freq: Four times a day (QID) | RESPIRATORY_TRACT | Status: DC

## 2023-02-01 MED ORDER — PREDNISONE 20 MG PO TABS: 40.0000 mg | ORAL_TABLET | Freq: Every day | ORAL | Status: DC

## 2023-02-01 MED ORDER — SODIUM CHLORIDE 0.9 % IV SOLN: INTRAVENOUS | Status: DC

## 2023-02-01 MED ORDER — IPRATROPIUM BROMIDE 0.02 % IN SOLN
1.0000 mg | Freq: Once | RESPIRATORY_TRACT | Status: AC
  Administered 2023-02-01: 1 mg via RESPIRATORY_TRACT
  Filled 2023-02-01: qty 5

## 2023-02-01 MED ORDER — BUDESON-GLYCOPYRROL-FORMOTEROL 160-9-4.8 MCG/ACT IN AERO: 2.0000 | INHALATION_SPRAY | Freq: Two times a day (BID) | RESPIRATORY_TRACT | Status: DC

## 2023-02-01 MED ORDER — ALBUTEROL SULFATE (2.5 MG/3ML) 0.083% IN NEBU
INHALATION_SOLUTION | RESPIRATORY_TRACT | Status: AC
  Administered 2023-02-01: 15 mg
  Filled 2023-02-01: qty 18

## 2023-02-01 MED ORDER — ONDANSETRON HCL 4 MG PO TABS: 4.0000 mg | ORAL_TABLET | Freq: Four times a day (QID) | ORAL | Status: DC | PRN

## 2023-02-01 MED ORDER — POLYETHYLENE GLYCOL 3350 17 G PO PACK: 17.0000 g | PACK | Freq: Every day | ORAL | Status: DC | PRN

## 2023-02-01 MED ORDER — SODIUM CHLORIDE 0.9 % IV SOLN
500.0000 mg | Freq: Every day | INTRAVENOUS | Status: DC
  Administered 2023-02-01 – 2023-02-06 (×6): 500 mg via INTRAVENOUS
  Filled 2023-02-01 (×6): qty 5

## 2023-02-01 MED ORDER — METHYLPREDNISOLONE SODIUM SUCC 125 MG IJ SOLR
60.0000 mg | Freq: Two times a day (BID) | INTRAMUSCULAR | Status: AC
  Administered 2023-02-02 (×2): 60 mg via INTRAVENOUS
  Filled 2023-02-01 (×2): qty 2

## 2023-02-01 MED ORDER — MOMETASONE FURO-FORMOTEROL FUM 100-5 MCG/ACT IN AERO
2.0000 | INHALATION_SPRAY | Freq: Two times a day (BID) | RESPIRATORY_TRACT | Status: DC
  Administered 2023-02-02 – 2023-02-03 (×3): 2 via RESPIRATORY_TRACT
  Filled 2023-02-01 (×2): qty 8.8

## 2023-02-01 MED ORDER — LACTATED RINGERS IV BOLUS: 1000.0000 mL | Freq: Once | INTRAVENOUS | Status: DC

## 2023-02-01 MED ORDER — ALBUTEROL SULFATE (2.5 MG/3ML) 0.083% IN NEBU
2.5000 mg | INHALATION_SOLUTION | Freq: Once | RESPIRATORY_TRACT | Status: AC
  Administered 2023-02-01: 2.5 mg via RESPIRATORY_TRACT
  Filled 2023-02-01: qty 3

## 2023-02-01 MED ORDER — ALBUTEROL SULFATE (2.5 MG/3ML) 0.083% IN NEBU
15.0000 mg/h | INHALATION_SOLUTION | RESPIRATORY_TRACT | Status: DC
  Administered 2023-02-01: 15 mg/h via RESPIRATORY_TRACT

## 2023-02-01 MED ORDER — EPINEPHRINE 0.3 MG/0.3ML IJ SOAJ
0.3000 mg | Freq: Once | INTRAMUSCULAR | Status: AC
  Administered 2023-02-01: 0.3 mg via INTRAMUSCULAR
  Filled 2023-02-01: qty 0.3

## 2023-02-01 MED ORDER — IPRATROPIUM-ALBUTEROL 0.5-2.5 (3) MG/3ML IN SOLN
3.0000 mL | Freq: Four times a day (QID) | RESPIRATORY_TRACT | Status: DC
  Administered 2023-02-02 (×3): 3 mL via RESPIRATORY_TRACT
  Filled 2023-02-01 (×3): qty 3

## 2023-02-01 MED ORDER — IPRATROPIUM-ALBUTEROL 0.5-2.5 (3) MG/3ML IN SOLN: 3.0000 mL | RESPIRATORY_TRACT | Status: DC | PRN

## 2023-02-01 MED ORDER — ENOXAPARIN SODIUM 40 MG/0.4ML IJ SOSY
40.0000 mg | PREFILLED_SYRINGE | Freq: Every day | INTRAMUSCULAR | Status: DC
  Administered 2023-02-01 – 2023-02-07 (×7): 40 mg via SUBCUTANEOUS
  Filled 2023-02-01 (×7): qty 0.4

## 2023-02-01 MED ORDER — MAGNESIUM SULFATE 2 GM/50ML IV SOLN
2.0000 g | Freq: Once | INTRAVENOUS | Status: AC
  Administered 2023-02-01: 2 g via INTRAVENOUS
  Filled 2023-02-01: qty 50

## 2023-02-01 MED ORDER — ACETAMINOPHEN 650 MG RE SUPP: 650.0000 mg | Freq: Four times a day (QID) | RECTAL | Status: DC | PRN

## 2023-02-01 MED ORDER — GUAIFENESIN-DM 100-10 MG/5ML PO SYRP
15.0000 mL | ORAL_SOLUTION | Freq: Three times a day (TID) | ORAL | Status: AC
  Administered 2023-02-01 – 2023-02-02 (×3): 15 mL via ORAL
  Filled 2023-02-01 (×3): qty 15

## 2023-02-01 MED ORDER — UMECLIDINIUM BROMIDE 62.5 MCG/ACT IN AEPB
1.0000 | INHALATION_SPRAY | Freq: Every day | RESPIRATORY_TRACT | Status: DC
  Administered 2023-02-02 – 2023-02-06 (×5): 1 via RESPIRATORY_TRACT
  Filled 2023-02-01: qty 7

## 2023-02-01 NOTE — Progress Notes (Signed)
Patient agreed to wear BIPAP and as soon as it was placed on his face ( not even 43mins ), he  began to complain " Its not working and he couldn't do it". Patient had been spoken to by his nurse and she reassured him that he needed to wear mask. Rt agreed and told patient th need to wear the mask to ensure that he continued to get better. Rt stepped out of room to check on another patient and once returning patient continued to complain that he couldn't wear the mask. Rt tried to reassure him everything was fine. Patient was placed back on 2lpm [place] and RT/RN will continue to monitor him.

## 2023-02-01 NOTE — H&P (Signed)
History and Physical    Miguel Marsh [id number] DOB: [birth year] DOA: 02/01/2023  PCP: Adaline Sill, NP   Patient coming from: Home  I have personally briefly reviewed patient's old medical records in [place]  Chief Complaint: Difficulty breathing  HPI: Miguel Marsh is a 62 y.o. male with medical history significant for COPD with chronic respiratory failure on 3 L, hypertension, right lung cancer. Patient was brought to the ED via EMS with complaints of difficulty breathing over the past 3 days.  He also reports a dry cough with generalized bodyaches.  No leg swelling.  No chest pain. Per EMS O2 sats were 85% on 3 L.    ED Course: Temperature 98.1.  Heart rate 77-104, respiratory rate 13-32, blood pressure systolic 89 to [id number], O2 sats initially ranging from 86 to 98%, on nasal cannula and nonrebreather, he was subsequently switched to BiPAP with VBG results showing pH of 7.1, pCO2 was not calculated.  Subsequent ABG shows pH of 7.26, pCO2 of greater than 123, after 2 hours on BiPAP, repeat ABG showed improved pH to 7.3, pCO2 of 123. Chest x-ray negative for acute abnormality. LR 500 mL given, 3.3 mg epinephrine, 2 g magnesium, continuous albuterol neb given. Patient was initially drowsy/somnolent, later improved with BiPAP. Hospitalist to admit for acute on chronic hypoxic and hypercapnic respiratory failure.  Review of Systems: As per HPI all other systems reviewed and negative.  Past Medical History:  Diagnosis Date   Arthritis    Bronchitis    Cancer    metastatic NSCLC (followed by [facility])   COPD (chronic obstructive pulmonary disease)    HTN (hypertension)     Past Surgical History:  Procedure Laterality Date   LUNG BIOPSY     TOTAL HIP ARTHROPLASTY     TUMOR REMOVAL Right 12/04/2017     reports that he has quit smoking. His smoking use included cigarettes. He smoked an average of .1 packs per day. He has never used smokeless tobacco. He reports  current alcohol use. He reports that he does not use drugs.  No Known Allergies  History reviewed. No pertinent family history.   Prior to Admission medications   Medication Sig Start Date End Date Taking? Authorizing Provider  albuterol (PROVENTIL HFA;VENTOLIN HFA) 108 (90 Base) MCG/ACT inhaler Inhale 2 puffs into the lungs every 6 (six) hours as needed for wheezing. 07/17/18   Roxan Hockey, MD  albuterol (PROVENTIL) (2.5 MG/3ML) 0.083% nebulizer solution Inhale 2.5 mg into the lungs every 6 (six) hours as needed for wheezing or shortness of breath.  01/19/20   [provider]  BREZTRI AEROSPHERE 160-9-4.8 MCG/ACT AERO Inhale 2 puffs into the lungs 2 (two) times daily. 05/17/22   [provider]  clonazePAM (KLONOPIN) 1 MG tablet Take 1 mg by mouth 2 (two) times daily. 05/18/22   [provider]  hydrochlorothiazide (HYDRODIURIL) 25 MG tablet Take 25 mg by mouth every morning. 07/02/20   [provider]  omeprazole (PRILOSEC) 40 MG capsule Take 1 capsule by mouth daily. 11/06/16   [provider]  oxyCODONE (ROXICODONE) 15 MG immediate release tablet Take 15 mg by mouth 4 (four) times daily as needed for pain.  08/13/20   [provider]  OXYGEN Inhale 3 L into the lungs continuous.     [provider]  predniSONE (DELTASONE) 10 MG tablet 60mg  po qday x 2 days, then 50mg  po qday x 2 days, then 40mg  po qday x 2 days  then 30mg  po qday x 2 days then 20mg  po qday x 2 days then 10mg  po qday x 2 days 06/12/22   Jani Gravel, MD    Physical Exam: Vitals:   02/01/23 1900 02/01/23 1930 02/01/23 1947 02/01/23 2000  BP: 130/63 (!) 103/41  (!) 89/25  Pulse: (!) 102 (!) 104  (!) 103  Resp: (!) 22 16  (!) 21  Temp:   98 F (36.7 C)   TempSrc:   Oral   SpO2: 100% 97%  100%  Weight:      Height:        Constitutional: Appears disheveled, calm Vitals:   02/01/23 1900 02/01/23 1930 02/01/23 1947 02/01/23 2000  BP: 130/63 (!) 103/41  (!)  89/25  Pulse: (!) 102 (!) 104  (!) 103  Resp: (!) 22 16  (!) 21  Temp:   98 F (36.7 C)   TempSrc:   Oral   SpO2: 100% 97%  100%  Weight:      Height:       Eyes: PERRL, lids and conjunctivae normal ENMT: Mucous membranes are moist.  Neck: normal, supple, no masses, no thyromegaly Respiratory: Diffuse expiratory wheezing Cardiovascular: Regular rate and rhythm, no murmurs / rubs / gallops. No extremity edema. 2+ pedal pulses. No carotid bruits.  Abdomen: no tenderness, no masses palpated. No hepatosplenomegaly. Bowel sounds positive.  Musculoskeletal: no clubbing / cyanosis. No joint deformity upper and lower extremities Skin: no rashes, lesions, ulcers. No induration Neurologic: Moving all extremities spontaneously, no facial asymmetry, speech clear without aphasia.  Psychiatric: Normal judgment and insight. Alert and oriented x 3. Normal mood.   Labs on Admission: I have personally reviewed following labs and imaging studies  CBC: Recent Labs  Lab 02/01/23 1350  WBC 6.0  HGB 12.6*  HCT 44.1  MCV 106.3*  PLT [id number]   Basic Metabolic Panel: Recent Labs  Lab 02/01/23 1350  NA 138  K 4.1  CL 84*  CO2 >45*  GLUCOSE 130*  BUN 7*  CREATININE 0.57*  CALCIUM 8.4*    Radiological Exams on Admission: DG Chest Port 1 View  Result Date: 02/01/2023 CLINICAL DATA:  Shortness of breath EXAM: PORTABLE CHEST 1 VIEW COMPARISON:  Radiograph 06/08/2022 FINDINGS: Unchanged cardiomediastinal silhouette. No focal airspace consolidation. Nodular opacity in the right lung base could correspond to a nodule seen on prior chest CT in December 2021. No pleural effusion or evidence of pneumothorax. IMPRESSION: No evidence of acute cardiopulmonary disease. Nodular right basilar opacity could correspond to nodularity seen on prior chest CT in December 2021. Recommend correlation with any more recent chest CT, if available. Otherwise, a chest CT is recommended for direct comparison. Electronically  Signed   By: Maurine Simmering M.D.   On: 02/01/2023 14:14    EKG: Independently reviewed.  Sinus rhythm, rate 88, QTc 462.  No significant abnormalities.  Baseline wander.  Assessment/Plan Principal Problem:   Acute on chronic respiratory failure with hypoxia and hypercapnia Active Problems:   COPD exacerbation   Acute metabolic encephalopathy   Essential hypertension   Tobacco use disorder  Assessment and Plan: * Acute on chronic respiratory failure with hypoxia and hypercapnia Initial ABG showing pH of 7.2, pCO2 of 123, pO2 of 59.  BMP shows bicarb of greater than 45, baseline high 30s, suggesting chronic retention.  Initial ED lethargic/somnolent in the ED, this improved.  Likely due to COPD exacerbation. -Continue BiPAP -N.p.o. -Will repeat ABG -Respiratory protocol  Acute metabolic encephalopathy Likely due to  marked CO2 narcosis, with ABG showing pH of greater than 123.  Lethargic, somnolent now improved on my evaluation awake alert and oriented x 3. -Continue BiPAP. -N.p.o. till improvement in pCO2  COPD exacerbation With acute hypoxic and hypercapnic respiratory failure.  Presenting with diffuse expiratory wheezing, dry cough.  Has required intubation in the past for respiratory failure.  Chest x-ray clear.  COVID influenza RSV negative. -Solu-Medrol 125 mg given by EMS, continue Solu-Medrol 60 twice daily -IV azithromycin -DuoNebs as needed and scheduled -Mucolytics as needed -Repeat ABG -Mucolytic's -CBGs twice daily while on steroids  Essential hypertension Systolic [id number], now soft. -500 mill LR bolus given, continue N/s 100cc/hr x 15hrs -Hold HCTZ    DVT prophylaxis: Lovenox Code Status: Full code-confirmed with patient at bedside. Family Communication: None at bedside. Disposition Plan: ~ 2 days Consults called: None  Admission status: Inpt Stepdown I certify that at the point of admission it is my clinical judgment that the patient will require inpatient  hospital care spanning beyond 2 midnights from the point of admission due to high intensity of service, high risk for further deterioration and high frequency of surveillance required.    Author: Bethena Roys, MD 02/01/2023 9:53 PM  For on call review [email or web address].

## 2023-02-01 NOTE — ED Notes (Signed)
Dr Denton Brick has spoken with pt and pt has agreed to wear this

## 2023-02-01 NOTE — ED Notes (Signed)
Dr Denton Brick notified that pt refused bipap- Dr says she will come and talk with pt-primary nurse and Tawnya Crook, [facility] both aware.

## 2023-02-01 NOTE — ED Provider Notes (Signed)
[place] [facility] AT [facility] Provider Note   CSN: [id number] Arrival date & time: 02/01/23  1257     History  Chief Complaint  Patient presents with   Shortness of Breath   Generalized Body Aches    Miguel CoolerKeith B Marsh is a 62 y.o. male.  62 year old male with a history of COPD on 3 L nasal cannula, tobacco abuse, hypertension, and non-small cell lung cancer who presents to the emergency department with generalized bodyaches, cough, shortness of breath for several days.  Reports that his cough has been dry.  Shortness of breath worsened today so he came into the emergency department.  Reports that he does not live with any other individuals who can provide additional history.  Denies any chest pain or lower extremity swelling with this.  EMS gave the patient 125 of Solu-Medrol and 7.5 mg of albuterol in route.  Was initially hypoxic on his 3 L nasal cannula baseline to 85%.       Home Medications Prior to Admission medications   Medication Sig Start Date End Date Taking? Authorizing Provider  albuterol (PROVENTIL HFA;VENTOLIN HFA) 108 (90 Base) MCG/ACT inhaler Inhale 2 puffs into the lungs every 6 (six) hours as needed for wheezing. 07/17/18   Shon HaleEmokpae, Courage, MD  albuterol (PROVENTIL) (2.5 MG/3ML) 0.083% nebulizer solution Inhale 2.5 mg into the lungs every 6 (six) hours as needed for wheezing or shortness of breath.  01/19/20   [provider]  BREZTRI AEROSPHERE 160-9-4.8 MCG/ACT AERO Inhale 2 puffs into the lungs 2 (two) times daily. 05/17/22   [provider]  clonazePAM (KLONOPIN) 1 MG tablet Take 1 mg by mouth 2 (two) times daily. 05/18/22   [provider]  hydrochlorothiazide (HYDRODIURIL) 25 MG tablet Take 25 mg by mouth every morning. 07/02/20   [provider]  omeprazole (PRILOSEC) 40 MG capsule Take 1 capsule by mouth daily. 11/06/16   [provider]  oxyCODONE (ROXICODONE) 15 MG immediate release tablet  Take 15 mg by mouth 4 (four) times daily as needed for pain.  08/13/20   [provider]  OXYGEN Inhale 3 L into the lungs continuous.     [provider]  predniSONE (DELTASONE) 10 MG tablet 60mg  po qday x 2 days, then 50mg  po qday x 2 days, then 40mg  po qday x 2 days then 30mg  po qday x 2 days then 20mg  po qday x 2 days then 10mg  po qday x 2 days 06/12/22   Pearson GrippeKim, James, MD      Allergies    Patient has no known allergies.    Review of Systems   Review of Systems  Physical Exam Updated Vital Signs BP 126/63 (BP Location: Right Arm)   Pulse (!) 103   Temp 98.4 F (36.9 C) (Oral)   Resp 19   Ht 5\' 6"  (1.676 m)   Wt 72.6 kg   SpO2 94%   BMI 25.83 kg/m  Physical Exam Vitals and nursing note reviewed.  Constitutional:      General: He is not in acute distress.    Appearance: He is well-developed.     Comments: Able to speak in short sentences.  Did desaturate while talking to 85%.  HENT:     Head: Normocephalic and atraumatic.     Right Ear: External ear normal.     Left Ear: External ear normal.     Nose: Nose normal.  Eyes:     Extraocular Movements: Extraocular movements intact.  Conjunctiva/sclera: Conjunctivae normal.     Pupils: Pupils are equal, round, and reactive to light.  Cardiovascular:     Rate and Rhythm: Normal rate and regular rhythm.     Heart sounds: Normal heart sounds.  Pulmonary:     Effort: Pulmonary effort is normal. No respiratory distress.     Breath sounds: Wheezing (Diffuse) present.  Musculoskeletal:     Cervical back: Normal range of motion and neck supple.     Right lower leg: Edema (1+) present.     Left lower leg: Edema (1+) present.  Skin:    General: Skin is warm and dry.  Neurological:     Mental Status: He is alert. Mental status is at baseline.  Psychiatric:        Mood and Affect: Mood normal.        Behavior: Behavior normal.     ED Results / Procedures / Treatments   Labs (all labs ordered are listed,  but only abnormal results are displayed) Labs Reviewed  BASIC METABOLIC PANEL - Abnormal; Notable for the following components:      Result Value   Chloride 84 (*)    CO2 >45 (*)    Glucose, Bld 130 (*)    BUN 7 (*)    Creatinine, Ser 0.57 (*)    Calcium 8.4 (*)    All other components within normal limits  CBC - Abnormal; Notable for the following components:   RBC 4.15 (*)    Hemoglobin 12.6 (*)    MCV 106.3 (*)    MCHC 28.6 (*)    All other components within normal limits  BLOOD GAS, VENOUS - Abnormal; Notable for the following components:   pH, Ven 7.19 (*)    pO2, Ven 68 (*)    All other components within normal limits  BLOOD GAS, ARTERIAL - Abnormal; Notable for the following components:   pH, Arterial 7.26 (*)    pCO2 arterial >123 (*)    pO2, Arterial 59 (*)    Bicarbonate 64.2 (*)    Acid-Base Excess 28.8 (*)    All other components within normal limits  BLOOD GAS, ARTERIAL - Abnormal; Notable for the following components:   pH, Arterial 7.3 (*)    pCO2 arterial 123 (*)    pO2, Arterial 44 (*)    Bicarbonate 61.0 (*)    Acid-Base Excess 27.1 (*)    All other components within normal limits  RESP PANEL BY RT-PCR (RSV, FLU A&B, COVID)  RVPGX2  MRSA NEXT GEN BY PCR, NASAL  GLUCOSE, CAPILLARY  BASIC METABOLIC PANEL  CBC  BLOOD GAS, ARTERIAL    EKG EKG Interpretation  Date/Time:  Thursday February 01 2023 13:48:46 EDT Ventricular Rate:  88 PR Interval:  137 QRS Duration: 80 QT Interval:  384 QTC Calculation: 462 R Axis:   141 Text Interpretation: Baseline wander Sinus rhythm Right axis deviation Nonspecific T abnrm, anterolateral leads Confirmed by Vonita Moss [phone]) on 02/01/2023 3:51:33 PM  Radiology DG Chest Port 1 View  Result Date: 02/01/2023 CLINICAL DATA:  Shortness of breath EXAM: PORTABLE CHEST 1 VIEW COMPARISON:  Radiograph 06/08/2022 FINDINGS: Unchanged cardiomediastinal silhouette. No focal airspace consolidation. Nodular opacity in the right  lung base could correspond to a nodule seen on prior chest CT in December 2021. No pleural effusion or evidence of pneumothorax. IMPRESSION: No evidence of acute cardiopulmonary disease. Nodular right basilar opacity could correspond to nodularity seen on prior chest CT in December 2021. Recommend correlation with any  more recent chest CT, if available. Otherwise, a chest CT is recommended for direct comparison. Electronically Signed   By: Caprice Renshaw M.D.   On: 02/01/2023 14:14    Procedures Procedures   Medications Ordered in ED Medications  azithromycin (ZITHROMAX) 500 mg in sodium chloride 0.9 % 250 mL IVPB (500 mg Intravenous New Bag/Given 02/01/23 2127)  enoxaparin (LOVENOX) injection 40 mg (40 mg Subcutaneous Given 02/01/23 2203)  0.9 %  sodium chloride infusion ( Intravenous New Bag/Given 02/01/23 2228)  acetaminophen (TYLENOL) tablet 650 mg (has no administration in time range)    Or  acetaminophen (TYLENOL) suppository 650 mg (has no administration in time range)  ondansetron (ZOFRAN) tablet 4 mg (has no administration in time range)    Or  ondansetron (ZOFRAN) injection 4 mg (has no administration in time range)  polyethylene glycol (MIRALAX / GLYCOLAX) packet 17 g (has no administration in time range)  methylPREDNISolone sodium succinate (SOLU-MEDROL) 125 mg/2 mL injection 60 mg (has no administration in time range)    Followed by  predniSONE (DELTASONE) tablet 40 mg (has no administration in time range)  ipratropium-albuterol (DUONEB) 0.5-2.5 (3) MG/3ML nebulizer solution 3 mL (has no administration in time range)  guaiFENesin-dextromethorphan (ROBITUSSIN DM) 100-10 MG/5ML syrup 15 mL (15 mLs Oral Given 02/01/23 2203)  Chlorhexidine Gluconate Cloth 2 % PADS 6 each (6 each Topical Given 02/01/23 2203)  ipratropium-albuterol (DUONEB) 0.5-2.5 (3) MG/3ML nebulizer solution 3 mL (has no administration in time range)  mometasone-formoterol (DULERA) 100-5 MCG/ACT inhaler 2 puff (2 puffs  Inhalation Not Given 02/01/23 2239)    And  umeclidinium bromide (INCRUSE ELLIPTA) 62.5 MCG/ACT 1 puff (has no administration in time range)  albuterol (PROVENTIL) (2.5 MG/3ML) 0.083% nebulizer solution (10 mg/hr Nebulization Given 02/01/23 1539)  ipratropium (ATROVENT) nebulizer solution 1 mg (1 mg Nebulization Given 02/01/23 1539)  albuterol (PROVENTIL) (2.5 MG/3ML) 0.083% nebulizer solution 2.5 mg (2.5 mg Nebulization Given 02/01/23 1715)  magnesium sulfate IVPB 2 g 50 mL (0 g Intravenous Stopped 02/01/23 1742)  EPINEPHrine (EPI-PEN) injection 0.3 mg (0.3 mg Intramuscular Given 02/01/23 1655)  albuterol (PROVENTIL) (2.5 MG/3ML) 0.083% nebulizer solution (15 mg  Given 02/01/23 1856)  lactated ringers bolus 500 mL (500 mLs Intravenous New Bag/Given 02/01/23 2123)    ED Course/ Medical Decision Making/ A&P Clinical Course as of 02/02/23 0119  Thu Feb 01, 2023  1703 pCO2 arterial(!!): >123 [RP]  1703 pH, Arterial(!): 7.26 [RP]  1906 Dr Mariea Clonts from hospitalist will evaluate for admission.  [RP]    Clinical Course User Index [RP] Rondel Baton, MD                             Medical Decision Making Amount and/or Complexity of Data Reviewed Labs: ordered. Decision-making details documented in ED Course.  Risk Prescription drug management. Decision regarding hospitalization.   Miguel Marsh is a 62 y.o. male with comorbidities that complicate the patient evaluation including COPD on 3 L nasal cannula, tobacco abuse, hypertension, and non-small cell lung cancer who presents to the emergency department with generalized bodyaches, cough, shortness of breath for several days.   Initial Ddx:  COPD exacerbation, hypercapnic respiratory failure, pneumonia, heart failure exacerbation, URI  MDM:  The patient may be having a COPD exacerbation precipitated by either URI or pneumonia.  Does appear somewhat drowsy so we will check a blood gas on him at this time.  Does appear to be in respiratory  distress so  we will start him on BiPAP at this time.  Steroids already given by EMS.  Plan:  Labs VBG COVID/flu Chest x-ray EKG Nebulizer BiPAP  ED Summary/Re-evaluation:  Patient underwent the above workup.  Did start becoming persistently more drowsy.  Had a VBG with a undetectably high pCO2 with ABG also showing undetectably high CO2.  Was given intramuscular epinephrine and continuous albuterol on the BiPAP and had improved mentation.  His ABG did also show some improvement in his pH and pCO2 became detectable at 123.  Does have a bicarb of approximately 60 so suspect much of his CO2 retention is chronic.  He was alert and oriented x 3 and was started on continuous albuterol.  Then admitted to medicine for further management.  This patient presents to the ED for concern of complaints listed in HPI, this involves an extensive number of treatment options, and is a complaint that carries with it a high risk of complications and morbidity. Disposition including potential need for admission considered.   Dispo: Admit to Step Down  Additional history obtained from EMS Records reviewed Outpatient Clinic Notes The following labs were independently interpreted: Chemistry and show  hypercarbia concerning for compensation for chronic respiratory alkalosis I independently reviewed the following imaging with scope of interpretation limited to determining acute life threatening conditions related to emergency care: Chest x-ray and agree with the radiologist interpretation with the following exceptions: none I personally reviewed and interpreted cardiac monitoring: normal sinus rhythm  I personally reviewed and interpreted the pt's EKG: see above for interpretation  I have reviewed the patients home medications and made adjustments as needed Consults: Hospitalist  Final Clinical Impression(s) / ED Diagnoses Final diagnoses:  COPD exacerbation  Acute on chronic respiratory failure with hypercapnia     Rx / DC Orders ED Discharge Orders     None      CRITICAL CARE Performed by: Rondel Baton   Total critical care time: 60 minutes  Critical care time was exclusive of separately billable procedures and treating other patients.  Critical care was necessary to treat or prevent imminent or life-threatening deterioration.  Critical care was time spent personally by me on the following activities: development of treatment plan with patient and/or surrogate as well as nursing, discussions with consultants, evaluation of patient's response to treatment, examination of patient, obtaining history from patient or surrogate, ordering and performing treatments and interventions, ordering and review of laboratory studies, ordering and review of radiographic studies, pulse oximetry and re-evaluation of patient's condition.    Rondel Baton, MD 02/02/23 [id number]

## 2023-02-01 NOTE — ED Triage Notes (Addendum)
Per EMS, Pt, from home, c/o SOB and generalized body aches x1 day.  Hx of COPD and Lung CA.  EMS reports Pt was 85% on 3L [place], which is Pt's normal amount.  After breathing treatments, Pt was 100% on Neb mask.     Given en route: Albuterol 7.5mg  Solu-Medrol 125mg 

## 2023-02-01 NOTE — ED Notes (Signed)
Pt had to be placed on 8L NRB prior to being placed in room d/t EMS having Pt on room air upon arrival.  Pt has been placed back on his normal 3L  and is maintaining at 98%.

## 2023-02-01 NOTE — Assessment & Plan Note (Addendum)
Likely due to marked CO2 narcosis, with ABG showing pH of greater than 123.  Lethargic, somnolent now improved on my evaluation awake alert and oriented x 3. -Continue BiPAP. -N.p.o. till improvement in pCO2

## 2023-02-01 NOTE — ED Notes (Signed)
Pt receiving nebulizer treatment. C/O being hungry.

## 2023-02-01 NOTE — Assessment & Plan Note (Addendum)
Initial ABG showing pH of 7.2, pCO2 of 123, pO2 of 59.  BMP shows bicarb of greater than 45, baseline high 30s, suggesting chronic retention.  Initial ED lethargic/somnolent in the ED, this improved.  Likely due to COPD exacerbation. -Continue BiPAP -N.p.o. -Will repeat ABG -Respiratory protocol

## 2023-02-01 NOTE — Assessment & Plan Note (Addendum)
Systolic [id number], now soft. -500 mill LR bolus given, continue N/s 100cc/hr x 15hrs -Hold HCTZ

## 2023-02-01 NOTE — Assessment & Plan Note (Addendum)
With acute hypoxic and hypercapnic respiratory failure.  Presenting with diffuse expiratory wheezing, dry cough.  Has required intubation in the past for respiratory failure.  Chest x-ray clear.  COVID influenza RSV negative. -Solu-Medrol 125 mg given by EMS, continue Solu-Medrol 60 twice daily -IV azithromycin -DuoNebs as needed and scheduled -Mucolytics as needed -Repeat ABG -Mucolytic's -CBGs twice daily while on steroids

## 2023-02-02 DIAGNOSIS — J9622 Acute and chronic respiratory failure with hypercapnia: Secondary | ICD-10-CM | POA: Diagnosis not present

## 2023-02-02 DIAGNOSIS — J9621 Acute and chronic respiratory failure with hypoxia: Secondary | ICD-10-CM | POA: Diagnosis not present

## 2023-02-02 LAB — GLUCOSE, CAPILLARY
Glucose-Capillary: 271 mg/dL — ABNORMAL HIGH (ref 70–99)
Glucose-Capillary: 93 mg/dL (ref 70–99)
Glucose-Capillary: 99 mg/dL (ref 70–99)

## 2023-02-02 LAB — MRSA NEXT GEN BY PCR, NASAL: MRSA by PCR Next Gen: NOT DETECTED

## 2023-02-02 LAB — CBC
HCT: 37.3 % — ABNORMAL LOW (ref 39.0–52.0)
Hemoglobin: 10.7 g/dL — ABNORMAL LOW (ref 13.0–17.0)
MCH: 30 pg (ref 26.0–34.0)
MCHC: 28.7 g/dL — ABNORMAL LOW (ref 30.0–36.0)
MCV: 104.5 fL — ABNORMAL HIGH (ref 80.0–100.0)
Platelets: 162 10*3/uL (ref 150–400)
RBC: 3.57 MIL/uL — ABNORMAL LOW (ref 4.22–5.81)
RDW: 12.3 % (ref 11.5–15.5)
WBC: 5.8 10*3/uL (ref 4.0–10.5)
nRBC: 0 % (ref 0.0–0.2)

## 2023-02-02 LAB — BLOOD GAS, ARTERIAL
Acid-Base Excess: 23.1 mmol/L — ABNORMAL HIGH (ref 0.0–2.0)
Bicarbonate: 52.3 mmol/L — ABNORMAL HIGH (ref 20.0–28.0)
Drawn by: 22179
O2 Saturation: 87.3 %
Patient temperature: 36.8
pCO2 arterial: 76 mmHg (ref 32–48)
pH, Arterial: 7.44 (ref 7.35–7.45)
pO2, Arterial: 45 mmHg — ABNORMAL LOW (ref 83–108)

## 2023-02-02 LAB — BASIC METABOLIC PANEL
Anion gap: 11 (ref 5–15)
BUN: 8 mg/dL (ref 8–23)
CO2: 41 mmol/L — ABNORMAL HIGH (ref 22–32)
Calcium: 8.6 mg/dL — ABNORMAL LOW (ref 8.9–10.3)
Chloride: 88 mmol/L — ABNORMAL LOW (ref 98–111)
Creatinine, Ser: 0.43 mg/dL — ABNORMAL LOW (ref 0.61–1.24)
GFR, Estimated: >60 mL/min (ref >60)
Glucose, Bld: 77 mg/dL (ref 70–99)
Potassium: 4 mmol/L (ref 3.5–5.1)
Sodium: 140 mmol/L (ref 135–145)

## 2023-02-02 MED ORDER — HYDROCHLOROTHIAZIDE 25 MG PO TABS
25.0000 mg | ORAL_TABLET | Freq: Every morning | ORAL | Status: DC
  Administered 2023-02-02 – 2023-02-05 (×4): 25 mg via ORAL
  Filled 2023-02-02 (×7): qty 1

## 2023-02-02 MED ORDER — CLONAZEPAM 0.5 MG PO TABS
1.0000 mg | ORAL_TABLET | Freq: Two times a day (BID) | ORAL | Status: DC | PRN
  Administered 2023-02-02 (×2): 1 mg via ORAL
  Filled 2023-02-02 (×3): qty 2

## 2023-02-02 MED ORDER — OXYCODONE HCL 5 MG PO TABS
15.0000 mg | ORAL_TABLET | Freq: Four times a day (QID) | ORAL | Status: DC | PRN
  Administered 2023-02-02 – 2023-02-07 (×8): 15 mg via ORAL
  Filled 2023-02-02 (×8): qty 3

## 2023-02-02 MED ORDER — IPRATROPIUM-ALBUTEROL 0.5-2.5 (3) MG/3ML IN SOLN
3.0000 mL | Freq: Three times a day (TID) | RESPIRATORY_TRACT | Status: DC
  Administered 2023-02-02 – 2023-02-06 (×11): 3 mL via RESPIRATORY_TRACT
  Filled 2023-02-02 (×12): qty 3

## 2023-02-02 MED ORDER — CLONAZEPAM 0.5 MG PO TABS: 1.0000 mg | ORAL_TABLET | Freq: Two times a day (BID) | ORAL | Status: DC

## 2023-02-02 NOTE — Progress Notes (Signed)
Date and time results received: 02/02/23 0940 (use smartphrase ".now" to insert current time)  Test: PCO2   Critical Value: 43  Name of Provider Notified: Dr Sherryll Burger  Orders Received? Or Actions Taken?:  none given at this time

## 2023-02-02 NOTE — Progress Notes (Signed)
MD made aware of patient refusing to wear BIPAP.

## 2023-02-02 NOTE — Progress Notes (Signed)
  Transition of Care [facility]) Screening Note   Patient Details  Name: Miguel Marsh Date of Birth: 12-Sep-1961   Transition of Care [facility]) CM/SW Contact:    Villa Herb, LCSWA Phone Number: 02/02/2023, 11:03 AM    Transition of Care Department [facility]) has reviewed patient and no TOC needs have been identified at this time. We will continue to monitor patient advancement through interdisciplinary progression rounds. If new patient transition needs arise, please place a TOC consult.

## 2023-02-02 NOTE — Progress Notes (Signed)
PROGRESS NOTE    Miguel Marsh  LFY:[id number] DOB: 06/15/1961 DOA: 02/01/2023 PCP: [name], NP   Brief Narrative:    Miguel Marsh is a 62 y.o. male with medical history significant for COPD with chronic respiratory failure on 3 L, hypertension, right lung cancer. Patient was brought to the ED via EMS with complaints of difficulty breathing over the past 3 days.  Patient was admitted with acute on chronic hypoxemic and hypercapnic respiratory failure and started on BiPAP.  He is also noted to have hypercapnic encephalopathy in the setting of COPD exacerbation.  Assessment & Plan:   Principal Problem:   Acute on chronic respiratory failure with hypoxia and hypercapnia Active Problems:   COPD exacerbation   Acute metabolic encephalopathy   Essential hypertension   Tobacco use disorder  Assessment and Plan:   Acute on chronic respiratory failure with hypoxia and hypercapnia Initial ABG showing pH of 7.2, pCO2 of 123, pO2 of 59.  BMP shows bicarb of greater than 45, baseline high 30s, suggesting chronic retention.  Initial ED lethargic/somnolent in the ED, this improved.  Likely due to COPD exacerbation. -ABG improving and patient weaned off BiPAP, okay to transfer to telemetry -Continue scheduled breathing treatments and Solu-Medrol -Repeat ABG in a.m.   Acute metabolic encephalopathy-resolved Likely due to marked CO2 narcosis, with ABG showing pH of greater than 123.  Lethargic, somnolent now improved on my evaluation awake alert and oriented x 3. -pCO2 levels improved   COPD exacerbation-improving With acute hypoxic and hypercapnic respiratory failure.  Presenting with diffuse expiratory wheezing, dry cough.  Has required intubation in the past for respiratory failure.  Chest x-ray clear.  COVID influenza RSV negative. -Solu-Medrol 125 mg given by EMS, continue Solu-Medrol 60 twice daily -IV azithromycin -DuoNebs as needed and scheduled -Mucolytics as  needed -Repeat ABG -Mucolytic's -CBGs twice daily while on steroids   Essential hypertension Blood pressure proved, hold further IV fluid Resume HCTZ    DVT prophylaxis: Lovenox Code Status: Full Family Communication: None at bedside Disposition Plan:  Status is: Inpatient Remains inpatient appropriate because: Need for IV medications.   Consultants:  None  Procedures:  None  Antimicrobials:  Anti-infectives (From admission, onward)    Start     Dose/Rate Route Frequency Ordered Stop   02/01/23 2100  azithromycin (ZITHROMAX) 500 mg in sodium chloride 0.9 % 250 mL IVPB        500 mg 250 mL/hr over 60 Minutes Intravenous Daily at bedtime 02/01/23 2054         Subjective: Patient seen and evaluated today with no new acute complaints or concerns. No acute concerns or events noted overnight.  He appears to be more alert and awake and is currently on nasal cannula.  Objective: Vitals:   02/02/23 0400 02/02/23 0434 02/02/23 0500 02/02/23 0600  BP: (!) 147/70  (!) 142/74 (!) 113/51  Pulse: 90  81 74  Resp: 16  13 19   Temp: 98.4 F (36.9 C)     TempSrc: Oral     SpO2: 90%  (!) 88% (!) 87%  Weight:  72.6 kg    Height:        Intake/Output Summary (Last 24 hours) at 02/02/2023 0714 Last data filed at 02/02/2023 0600 Gross per 24 hour  Intake 1272.52 ml  Output 200 ml  Net 1072.52 ml   Filed Weights   02/01/23 1335 02/01/23 2214 02/02/23 0434  Weight: 81.2 kg 72.6 kg 72.6 kg    Examination:  General exam: Appears calm and comfortable  Respiratory system: Clear to auscultation. Respiratory effort normal.  2.5 L nasal cannula oxygen Cardiovascular system: S1 & S2 heard, RRR.  Gastrointestinal system: Abdomen is soft Central nervous system: Alert and awake Extremities: No edema Skin: No significant lesions noted Psychiatry: Flat affect.    Data Reviewed: I have personally reviewed following labs and imaging studies  CBC: Recent Labs  Lab 02/01/23 1350  02/02/23 0625  WBC 6.0 5.8  HGB 12.6* 10.7*  HCT 44.1 37.3*  MCV 106.3* 104.5*  PLT 173 162   Basic Metabolic Panel: Recent Labs  Lab 02/01/23 1350 02/02/23 0503  NA 138 140  K 4.1 4.0  CL 84* 88*  CO2 >45* 41*  GLUCOSE 130* 77  BUN 7* 8  CREATININE 0.57* 0.43*  CALCIUM 8.4* 8.6*   GFR: Estimated Creatinine Clearance: 87.5 mL/min (A) (by C-G formula based on SCr of 0.43 mg/dL (L)). Liver Function Tests: No results for input(s): "AST", "ALT", "ALKPHOS", "BILITOT", "PROT", "ALBUMIN" in the last 168 hours. No results for input(s): "LIPASE", "AMYLASE" in the last 168 hours. No results for input(s): "AMMONIA" in the last 168 hours. Coagulation Profile: No results for input(s): "INR", "PROTIME" in the last 168 hours. Cardiac Enzymes: No results for input(s): "CKTOTAL", "CKMB", "CKMBINDEX", "TROPONINI" in the last 168 hours. BNP (last 3 results) No results for input(s): "PROBNP" in the last 8760 hours. HbA1C: No results for input(s): "HGBA1C" in the last 72 hours. CBG: Recent Labs  Lab 02/02/23 0020  GLUCAP 93   Lipid Profile: No results for input(s): "CHOL", "HDL", "LDLCALC", "TRIG", "CHOLHDL", "LDLDIRECT" in the last 72 hours. Thyroid Function Tests: No results for input(s): "TSH", "T4TOTAL", "FREET4", "T3FREE", "THYROIDAB" in the last 72 hours. Anemia Panel: No results for input(s): "VITAMINB12", "FOLATE", "FERRITIN", "TIBC", "IRON", "RETICCTPCT" in the last 72 hours. Sepsis Labs: No results for input(s): "PROCALCITON", "LATICACIDVEN" in the last 168 hours.  Recent Results (from the past 240 hour(s))  Resp panel by RT-PCR (RSV, Flu A&B, Covid) Anterior Nasal Swab     Status: None   Collection Time: 02/01/23  2:14 PM   Specimen: Anterior Nasal Swab  Result Value Ref Range Status   SARS Coronavirus 2 by RT PCR NEGATIVE NEGATIVE Final    Comment: (NOTE) SARS-CoV-2 target nucleic acids are NOT DETECTED.  The SARS-CoV-2 RNA is generally detectable in upper  respiratory specimens during the acute phase of infection. The lowest concentration of SARS-CoV-2 viral copies this assay can detect is 138 copies/mL. A negative result does not preclude SARS-Cov-2 infection and should not be used as the sole basis for treatment or other patient management decisions. A negative result may occur with  improper specimen collection/handling, submission of specimen other than nasopharyngeal swab, presence of viral mutation(s) within the areas targeted by this assay, and inadequate number of viral copies(<138 copies/mL). A negative result must be combined with clinical observations, patient history, and epidemiological information. The expected result is Negative.  Fact Sheet for Patients:  BloggerCourse.com  Fact Sheet for Healthcare Providers:  SeriousBroker.it  This test is no t yet approved or cleared by the [place] FDA and  has been authorized for detection and/or diagnosis of SARS-CoV-2 by FDA under an Emergency Use Authorization (EUA). This EUA will remain  in effect (meaning this test can be used) for the duration of the COVID-19 declaration under Section 564(b)(1) of the Act, 21 U.S.C.section 360bbb-3(b)(1), unless the authorization is terminated  or revoked sooner.  Influenza A by PCR NEGATIVE NEGATIVE Final   Influenza B by PCR NEGATIVE NEGATIVE Final    Comment: (NOTE) The Xpert Xpress SARS-CoV-2/FLU/RSV plus assay is intended as an aid in the diagnosis of influenza from Nasopharyngeal swab specimens and should not be used as a sole basis for treatment. Nasal washings and aspirates are unacceptable for Xpert Xpress SARS-CoV-2/FLU/RSV testing.  Fact Sheet for Patients: BloggerCourse.comhttps://www.fda.gov/media/152166/download  Fact Sheet for Healthcare Providers: SeriousBroker.ithttps://www.fda.gov/media/152162/download  This test is not yet approved or cleared by the [place] FDA and has been  authorized for detection and/or diagnosis of SARS-CoV-2 by FDA under an Emergency Use Authorization (EUA). This EUA will remain in effect (meaning this test can be used) for the duration of the COVID-19 declaration under Section 564(b)(1) of the Act, 21 U.S.C. section 360bbb-3(b)(1), unless the authorization is terminated or revoked.     Resp Syncytial Virus by PCR NEGATIVE NEGATIVE Final    Comment: (NOTE) Fact Sheet for Patients: BloggerCourse.comhttps://www.fda.gov/media/152166/download  Fact Sheet for Healthcare Providers: SeriousBroker.ithttps://www.fda.gov/media/152162/download  This test is not yet approved or cleared by the [place] FDA and has been authorized for detection and/or diagnosis of SARS-CoV-2 by FDA under an Emergency Use Authorization (EUA). This EUA will remain in effect (meaning this test can be used) for the duration of the COVID-19 declaration under Section 564(b)(1) of the Act, 21 U.S.C. section 360bbb-3(b)(1), unless the authorization is terminated or revoked.  Performed at [facility], [address] [place], [place] [id number]          [facility]  Result Date: 02/01/2023 CLINICAL DATA:  Shortness of breath EXAM: PORTABLE CHEST 1 VIEW COMPARISON:  Radiograph 06/08/2022 FINDINGS: Unchanged cardiomediastinal silhouette. No focal airspace consolidation. Nodular opacity in the right lung base could correspond to a nodule seen on prior chest CT in December 2021. No pleural effusion or evidence of pneumothorax. IMPRESSION: No evidence of acute cardiopulmonary disease. Nodular right basilar opacity could correspond to nodularity seen on prior chest CT in December 2021. Recommend correlation with any more recent chest CT, if available. Otherwise, a chest CT is recommended for direct comparison. Electronically Signed   By: Caprice RenshawJacob  Kahn M.D.   On: 02/01/2023 14:14        Scheduled Meds:  Chlorhexidine Gluconate Cloth  6 each Topical Q0600   enoxaparin  (LOVENOX) injection  40 mg Subcutaneous QHS   guaiFENesin-dextromethorphan  15 mL Oral Q8H   ipratropium-albuterol  3 mL Nebulization Q6H   methylPREDNISolone (SOLU-MEDROL) injection  60 mg Intravenous Q12H   Followed by   Melene Muller[START ON 02/03/2023] predniSONE  40 mg Oral Q breakfast   mometasone-formoterol  2 puff Inhalation BID   And   umeclidinium bromide  1 puff Inhalation Daily   Continuous Infusions:  sodium chloride 100 mL/hr at 02/02/23 0319   azithromycin Stopped (02/01/23 2251)     LOS: 1 day    Time spent: 35 minutes    Deiontae Rabel Hoover Brunette Peggy Loge, DO [facility]  If 7PM-7AM, please contact night-coverage [email or web address] 02/02/2023, 7:14 AM

## 2023-02-02 NOTE — Plan of Care (Signed)
  Problem: Education: Goal: Knowledge of General Education information will improve Description: Including pain rating scale, medication(s)/side effects and non-pharmacologic comfort measures Outcome: Not Progressing   Problem: Health Behavior/Discharge Planning: Goal: Ability to manage health-related needs will improve Outcome: Not Progressing   Problem: Clinical Measurements: Goal: Ability to maintain clinical measurements within normal limits will improve Outcome: Not Progressing Goal: Will remain free from infection Outcome: Not Progressing Goal: Diagnostic test results will improve Outcome: Not Progressing Goal: Respiratory complications will improve Outcome: Not Progressing Goal: Cardiovascular complication will be avoided Outcome: Not Progressing   Problem: Activity: Goal: Risk for activity intolerance will decrease Outcome: Not Progressing   Problem: Nutrition: Goal: Adequate nutrition will be maintained Outcome: Not Progressing   Problem: Coping: Goal: Level of anxiety will decrease Outcome: Not Progressing   Problem: Elimination: Goal: Will not experience complications related to bowel motility Outcome: Not Progressing Goal: Will not experience complications related to urinary retention Outcome: Not Progressing   Problem: Pain Managment: Goal: General experience of comfort will improve Outcome: Not Progressing   Problem: Safety: Goal: Ability to remain free from injury will improve Outcome: Not Progressing   Problem: Skin Integrity: Goal: Risk for impaired skin integrity will decrease Outcome: Not Progressing   Problem: Education: Goal: Knowledge of disease or condition will improve Outcome: Not Progressing Goal: Knowledge of the prescribed therapeutic regimen will improve Outcome: Not Progressing Goal: Individualized Educational Video(s) Outcome: Not Progressing   Problem: Activity: Goal: Ability to tolerate increased activity will  improve Outcome: Not Progressing Goal: Will verbalize the importance of balancing activity with adequate rest periods Outcome: Not Progressing   Problem: Respiratory: Goal: Ability to maintain a clear airway will improve Outcome: Not Progressing Goal: Levels of oxygenation will improve Outcome: Not Progressing Goal: Ability to maintain adequate ventilation will improve Outcome: Not Progressing   

## 2023-02-03 DIAGNOSIS — J9621 Acute and chronic respiratory failure with hypoxia: Secondary | ICD-10-CM | POA: Diagnosis not present

## 2023-02-03 DIAGNOSIS — J9622 Acute and chronic respiratory failure with hypercapnia: Secondary | ICD-10-CM | POA: Diagnosis not present

## 2023-02-03 LAB — BASIC METABOLIC PANEL
Anion gap: 6 (ref 5–15)
BUN: 10 mg/dL (ref 8–23)
CO2: 44 mmol/L — ABNORMAL HIGH (ref 22–32)
Calcium: 8.4 mg/dL — ABNORMAL LOW (ref 8.9–10.3)
Chloride: 89 mmol/L — ABNORMAL LOW (ref 98–111)
Creatinine, Ser: 0.54 mg/dL — ABNORMAL LOW (ref 0.61–1.24)
GFR, Estimated: >60 mL/min (ref >60)
Glucose, Bld: 114 mg/dL — ABNORMAL HIGH (ref 70–99)
Potassium: 4.3 mmol/L (ref 3.5–5.1)
Sodium: 139 mmol/L (ref 135–145)

## 2023-02-03 LAB — BLOOD GAS, VENOUS
Acid-Base Excess: 27 mmol/L — ABNORMAL HIGH (ref 0.0–2.0)
Bicarbonate: 59 mmol/L — ABNORMAL HIGH (ref 20.0–28.0)
Drawn by: 66460
O2 Saturation: 97 %
Patient temperature: 36.5
pCO2, Ven: 100 mmHg (ref 44–60)
pH, Ven: 7.38 (ref 7.25–7.43)
pO2, Ven: 74 mmHg — ABNORMAL HIGH (ref 32–45)

## 2023-02-03 LAB — CBC
HCT: 36.1 % — ABNORMAL LOW (ref 39.0–52.0)
Hemoglobin: 10.7 g/dL — ABNORMAL LOW (ref 13.0–17.0)
MCH: 30.3 pg (ref 26.0–34.0)
MCHC: 29.6 g/dL — ABNORMAL LOW (ref 30.0–36.0)
MCV: 102.3 fL — ABNORMAL HIGH (ref 80.0–100.0)
Platelets: 201 10*3/uL (ref 150–400)
RBC: 3.53 MIL/uL — ABNORMAL LOW (ref 4.22–5.81)
RDW: 12.6 % (ref 11.5–15.5)
WBC: 6 10*3/uL (ref 4.0–10.5)
nRBC: 0 % (ref 0.0–0.2)

## 2023-02-03 LAB — MAGNESIUM: Magnesium: 1.9 mg/dL (ref 1.7–2.4)

## 2023-02-03 LAB — GLUCOSE, CAPILLARY
Glucose-Capillary: 84 mg/dL (ref 70–99)
Glucose-Capillary: 119 mg/dL — ABNORMAL HIGH (ref 70–99)

## 2023-02-03 MED ORDER — BUDESONIDE 0.25 MG/2ML IN SUSP
0.2500 mg | Freq: Two times a day (BID) | RESPIRATORY_TRACT | Status: DC
  Administered 2023-02-03 – 2023-02-08 (×11): 0.25 mg via RESPIRATORY_TRACT
  Filled 2023-02-03 (×11): qty 2

## 2023-02-03 MED ORDER — METHYLPREDNISOLONE SODIUM SUCC 125 MG IJ SOLR
60.0000 mg | Freq: Two times a day (BID) | INTRAMUSCULAR | Status: DC
  Administered 2023-02-03 – 2023-02-07 (×9): 60 mg via INTRAVENOUS
  Filled 2023-02-03 (×9): qty 2

## 2023-02-03 NOTE — Progress Notes (Signed)
Patient O2 sat 73% on 3L O2 while sleeping. Increased nasal canula O2 to 5L, repositioned patient and notified respiratory. Sats increased to 92% on 5L. Patient refusing Bipap. Dr. Lenoria Farrier notified through secure chat. Will continue to monitor.

## 2023-02-03 NOTE — Progress Notes (Signed)
Reduced O2 to 4lpm [place]

## 2023-02-03 NOTE — Progress Notes (Signed)
PROGRESS NOTE    Miguel Marsh  ZOX:[id number] DOB: 1961/05/29 DOA: 02/01/2023 PCP: Rebekah Chesterfieldickey, [place] M, NP   Brief Narrative:    Miguel CoolerKeith B Marsh is a 62 y.o. male with medical history significant for COPD with chronic respiratory failure on 3 L, hypertension, right lung cancer. Patient was brought to the ED via EMS with complaints of difficulty breathing over the past 3 days.  Patient was admitted with acute on chronic hypoxemic and hypercapnic respiratory failure and started on BiPAP.  He is also noted to have hypercapnic encephalopathy in the setting of COPD exacerbation.  He continues to have high oxygen requirement as well as elevated pCO2 levels.  Assessment & Plan:   Principal Problem:   Acute on chronic respiratory failure with hypoxia and hypercapnia Active Problems:   COPD exacerbation   Acute metabolic encephalopathy   Essential hypertension   Tobacco use disorder  Assessment and Plan:   Acute on chronic respiratory failure with hypoxia and hypercapnia Initial ABG showing pH of 7.2, pCO2 of 123, pO2 of 59.  BMP shows bicarb of greater than 45, baseline high 30s, suggesting chronic retention.  Initial ED lethargic/somnolent in the ED, this improved.  Likely due to COPD exacerbation. -Venous blood gas performed with elevated pCO2 levels.  If he remains somnolent, will plan to transfer to stepdown with BiPAP. -Continue scheduled breathing treatments and Solu-Medrol -Repeat ABG in a.m.   Acute metabolic encephalopathy-resolved Likely due to marked CO2 narcosis, with ABG showing pH of greater than 123.  Lethargic, somnolent now improved on my evaluation awake alert and oriented x 3. -pCO2 levels remain increased on venous blood gas, continue to follow   COPD exacerbation-improving With acute hypoxic and hypercapnic respiratory failure.  Presenting with diffuse expiratory wheezing, dry cough.  Has required intubation in the past for respiratory failure.  Chest x-ray clear.   COVID influenza RSV negative. -Solu-Medrol 125 mg given by EMS, continue Solu-Medrol 60 twice daily -IV azithromycin -DuoNebs as needed and scheduled -Mucolytics as needed -Repeat ABG in a.m. -Mucolytic's -CBGs twice daily while on steroids   Essential hypertension Blood pressure proved, hold further IV fluid Resume HCTZ    DVT prophylaxis: Lovenox Code Status: Full Family Communication: None at bedside Disposition Plan:  Status is: Inpatient Remains inpatient appropriate because: Need for IV medications.   Consultants:  None  Procedures:  None  Antimicrobials:  Anti-infectives (From admission, onward)    Start     Dose/Rate Route Frequency Ordered Stop   02/01/23 2100  azithromycin (ZITHROMAX) 500 mg in sodium chloride 0.9 % 250 mL IVPB        500 mg 250 mL/hr over 60 Minutes Intravenous Daily at bedtime 02/01/23 2054         Subjective: Patient seen and evaluated today with no new acute complaints or concerns. No acute concerns or events noted overnight.  He appears to be more alert and awake and is currently on nasal cannula.  Objective: Vitals:   02/03/23 0600 02/03/23 0751 02/03/23 0810 02/03/23 0818  BP:  109/61    Pulse:  64    Resp:  14    Temp:  97.7 F (36.5 C)    TempSrc:  Oral    SpO2: 100% 100% 100% 100%  Weight:      Height:        Intake/Output Summary (Last 24 hours) at 02/03/2023 1103 Last data filed at 02/02/2023 1842 Gross per 24 hour  Intake 240 ml  Output 700 ml  Net -460 ml   Filed Weights   02/01/23 1335 02/01/23 2214 02/02/23 0434  Weight: 81.2 kg 72.6 kg 72.6 kg    Examination:  General exam: Appears calm and comfortable  Respiratory system: Clear to auscultation. Respiratory effort normal.  8 L nasal cannula oxygen Cardiovascular system: S1 & S2 heard, RRR.  Gastrointestinal system: Abdomen is soft Central nervous system: Arousable and answers questions Extremities: No edema Skin: No significant lesions  noted Psychiatry: Flat affect.    Data Reviewed: I have personally reviewed following labs and imaging studies  CBC: Recent Labs  Lab 02/01/23 1350 02/02/23 0625 02/03/23 0513  WBC 6.0 5.8 6.0  HGB 12.6* 10.7* 10.7*  HCT 44.1 37.3* 36.1*  MCV 106.3* 104.5* 102.3*  PLT 173 162 201   Basic Metabolic Panel: Recent Labs  Lab 02/01/23 1350 02/02/23 0503 02/03/23 0513  NA 138 140 139  K 4.1 4.0 4.3  CL 84* 88* 89*  CO2 >45* 41* 44*  GLUCOSE 130* 77 114*  BUN 7* 8 10  CREATININE 0.57* 0.43* 0.54*  CALCIUM 8.4* 8.6* 8.4*  MG  --   --  1.9   GFR: Estimated Creatinine Clearance: 87.5 mL/min (A) (by C-G formula based on SCr of 0.54 mg/dL (L)). Liver Function Tests: No results for input(s): "AST", "ALT", "ALKPHOS", "BILITOT", "PROT", "ALBUMIN" in the last 168 hours. No results for input(s): "LIPASE", "AMYLASE" in the last 168 hours. No results for input(s): "AMMONIA" in the last 168 hours. Coagulation Profile: No results for input(s): "INR", "PROTIME" in the last 168 hours. Cardiac Enzymes: No results for input(s): "CKTOTAL", "CKMB", "CKMBINDEX", "TROPONINI" in the last 168 hours. BNP (last 3 results) No results for input(s): "PROBNP" in the last 8760 hours. HbA1C: No results for input(s): "HGBA1C" in the last 72 hours. CBG: Recent Labs  Lab 02/02/23 0020 02/02/23 0810 02/02/23 1950 02/03/23 0808  GLUCAP 93 99 271* 84   Lipid Profile: No results for input(s): "CHOL", "HDL", "LDLCALC", "TRIG", "CHOLHDL", "LDLDIRECT" in the last 72 hours. Thyroid Function Tests: No results for input(s): "TSH", "T4TOTAL", "FREET4", "T3FREE", "THYROIDAB" in the last 72 hours. Anemia Panel: No results for input(s): "VITAMINB12", "FOLATE", "FERRITIN", "TIBC", "IRON", "RETICCTPCT" in the last 72 hours. Sepsis Labs: No results for input(s): "PROCALCITON", "LATICACIDVEN" in the last 168 hours.  Recent Results (from the past 240 hour(s))  Resp panel by RT-PCR (RSV, Flu A&B, Covid)  Anterior Nasal Swab     Status: None   Collection Time: 02/01/23  2:14 PM   Specimen: Anterior Nasal Swab  Result Value Ref Range Status   SARS Coronavirus 2 by RT PCR NEGATIVE NEGATIVE Final    Comment: (NOTE) SARS-CoV-2 target nucleic acids are NOT DETECTED.  The SARS-CoV-2 RNA is generally detectable in upper respiratory specimens during the acute phase of infection. The lowest concentration of SARS-CoV-2 viral copies this assay can detect is 138 copies/mL. A negative result does not preclude SARS-Cov-2 infection and should not be used as the sole basis for treatment or other patient management decisions. A negative result may occur with  improper specimen collection/handling, submission of specimen other than nasopharyngeal swab, presence of viral mutation(s) within the areas targeted by this assay, and inadequate number of viral copies(<138 copies/mL). A negative result must be combined with clinical observations, patient history, and epidemiological information. The expected result is Negative.  Fact Sheet for Patients:  BloggerCourse.com  Fact Sheet for Healthcare Providers:  SeriousBroker.it  This test is no t yet approved or cleared by the [place]  FDA and  has been authorized for detection and/or diagnosis of SARS-CoV-2 by FDA under an Emergency Use Authorization (EUA). This EUA will remain  in effect (meaning this test can be used) for the duration of the COVID-19 declaration under Section 564(b)(1) of the Act, 21 U.S.C.section 360bbb-3(b)(1), unless the authorization is terminated  or revoked sooner.       Influenza A by PCR NEGATIVE NEGATIVE Final   Influenza B by PCR NEGATIVE NEGATIVE Final    Comment: (NOTE) The Xpert Xpress SARS-CoV-2/FLU/RSV plus assay is intended as an aid in the diagnosis of influenza from Nasopharyngeal swab specimens and should not be used as a sole basis for treatment. Nasal washings  and aspirates are unacceptable for Xpert Xpress SARS-CoV-2/FLU/RSV testing.  Fact Sheet for Patients: BloggerCourse.com  Fact Sheet for Healthcare Providers: SeriousBroker.it  This test is not yet approved or cleared by the [place] FDA and has been authorized for detection and/or diagnosis of SARS-CoV-2 by FDA under an Emergency Use Authorization (EUA). This EUA will remain in effect (meaning this test can be used) for the duration of the COVID-19 declaration under Section 564(b)(1) of the Act, 21 U.S.C. section 360bbb-3(b)(1), unless the authorization is terminated or revoked.     Resp Syncytial Virus by PCR NEGATIVE NEGATIVE Final    Comment: (NOTE) Fact Sheet for Patients: BloggerCourse.com  Fact Sheet for Healthcare Providers: SeriousBroker.it  This test is not yet approved or cleared by the [place] FDA and has been authorized for detection and/or diagnosis of SARS-CoV-2 by FDA under an Emergency Use Authorization (EUA). This EUA will remain in effect (meaning this test can be used) for the duration of the COVID-19 declaration under Section 564(b)(1) of the Act, 21 U.S.C. section 360bbb-3(b)(1), unless the authorization is terminated or revoked.  Performed at [facility], [address], [place], [place] [address]   MRSA Next Gen by PCR, Nasal     Status: None   Collection Time: 02/01/23  9:50 PM   Specimen: Nasal Mucosa; Nasal Swab  Result Value Ref Range Status   MRSA by PCR Next Gen NOT DETECTED NOT DETECTED Final    Comment: (NOTE) The GeneXpert MRSA Assay (FDA approved for NASAL specimens only), is one component of a comprehensive MRSA colonization surveillance program. It is not intended to diagnose MRSA infection nor to guide or monitor treatment for MRSA infections. Test performance is not FDA approved in patients less than 15 years old. Performed  at [facility], [address] [place], [place] [address]          Radiology Studies: [facility] Chest [facility] 1 View  Result Date: 02/01/2023 CLINICAL DATA:  Shortness of breath EXAM: PORTABLE CHEST 1 VIEW COMPARISON:  Radiograph 06/08/2022 FINDINGS: Unchanged cardiomediastinal silhouette. No focal airspace consolidation. Nodular opacity in the right lung base could correspond to a nodule seen on prior chest CT in December 2021. No pleural effusion or evidence of pneumothorax. IMPRESSION: No evidence of acute cardiopulmonary disease. Nodular right basilar opacity could correspond to nodularity seen on prior chest CT in December 2021. Recommend correlation with any more recent chest CT, if available. Otherwise, a chest CT is recommended for direct comparison. Electronically Signed   By: Caprice Renshaw M.D.   On: 02/01/2023 14:14        Scheduled Meds:  budesonide (PULMICORT) nebulizer solution  0.25 mg Nebulization BID   enoxaparin (LOVENOX) injection  40 mg Subcutaneous QHS   hydrochlorothiazide  25 mg Oral q morning   ipratropium-albuterol  3 mL Nebulization TID   methylPREDNISolone (SOLU-MEDROL) injection  60 mg Intravenous Q12H   umeclidinium bromide  1 puff Inhalation Daily   Continuous Infusions:  azithromycin Stopped (02/02/23 2338)     LOS: 2 days    Time spent: 35 minutes    Chelsia Serres Hoover Brunette, DO [facility]  If 7PM-7AM, please contact night-coverage [email or web address] 02/03/2023, 11:03 AM

## 2023-02-04 DIAGNOSIS — J9621 Acute and chronic respiratory failure with hypoxia: Secondary | ICD-10-CM | POA: Diagnosis not present

## 2023-02-04 DIAGNOSIS — J9622 Acute and chronic respiratory failure with hypercapnia: Secondary | ICD-10-CM | POA: Diagnosis not present

## 2023-02-04 LAB — BASIC METABOLIC PANEL
Anion gap: 6 (ref 5–15)
BUN: 13 mg/dL (ref 8–23)
CO2: 45 mmol/L — ABNORMAL HIGH (ref 22–32)
Calcium: 8.6 mg/dL — ABNORMAL LOW (ref 8.9–10.3)
Chloride: 86 mmol/L — ABNORMAL LOW (ref 98–111)
Creatinine, Ser: 0.45 mg/dL — ABNORMAL LOW (ref 0.61–1.24)
GFR, Estimated: >60 mL/min (ref >60)
Glucose, Bld: 117 mg/dL — ABNORMAL HIGH (ref 70–99)
Potassium: 4.1 mmol/L (ref 3.5–5.1)
Sodium: 137 mmol/L (ref 135–145)

## 2023-02-04 LAB — CBC
HCT: 37.9 % — ABNORMAL LOW (ref 39.0–52.0)
Hemoglobin: 11.4 g/dL — ABNORMAL LOW (ref 13.0–17.0)
MCH: 30.1 pg (ref 26.0–34.0)
MCHC: 30.1 g/dL (ref 30.0–36.0)
MCV: 100 fL (ref 80.0–100.0)
Platelets: 209 10*3/uL (ref 150–400)
RBC: 3.79 MIL/uL — ABNORMAL LOW (ref 4.22–5.81)
RDW: 12.3 % (ref 11.5–15.5)
WBC: 4.9 10*3/uL (ref 4.0–10.5)
nRBC: 0 % (ref 0.0–0.2)

## 2023-02-04 LAB — BLOOD GAS, VENOUS
Acid-Base Excess: 30.2 mmol/L — ABNORMAL HIGH (ref 0.0–2.0)
Bicarbonate: 61.6 mmol/L — ABNORMAL HIGH (ref 20.0–28.0)
Drawn by: 27160
O2 Saturation: 93.3 %
Patient temperature: 36.7
pCO2, Ven: 94 mmHg (ref 44–60)
pH, Ven: 7.42 (ref 7.25–7.43)
pO2, Ven: 61 mmHg — ABNORMAL HIGH (ref 32–45)

## 2023-02-04 LAB — MAGNESIUM: Magnesium: 1.8 mg/dL (ref 1.7–2.4)

## 2023-02-04 NOTE — Progress Notes (Signed)
PROGRESS NOTE    Miguel Marsh  ZOX:[id number]:[id number] DOB: [birth year] DOA: 02/01/2023 PCP: Rebekah Chesterfieldickey, [place] M, NP   Brief Narrative:    Miguel Marsh is a 62 y.o. male with medical history significant for COPD with chronic respiratory failure on 3 L, hypertension, right lung cancer. Patient was brought to the ED via EMS with complaints of difficulty breathing over the past 3 days.  Patient was admitted with acute on chronic hypoxemic and hypercapnic respiratory failure and started on BiPAP.  He is also noted to have hypercapnic encephalopathy in the setting of COPD exacerbation.  He continues to have high oxygen requirement as well as elevated pCO2 levels.  Plan to continue monitoring with pulmonology evaluation in a.m.  Assessment & Plan:   Principal Problem:   Acute on chronic respiratory failure with hypoxia and hypercapnia Active Problems:   COPD exacerbation   Acute metabolic encephalopathy   Essential hypertension   Tobacco use disorder  Assessment and Plan:   Acute on chronic respiratory failure with hypoxia and hypercapnia Initial ABG showing pH of 7.2, pCO2 of 123, pO2 of 59.  BMP shows bicarb of greater than 45, baseline high 30s, suggesting chronic retention.  Initial ED lethargic/somnolent in the ED, this improved.  Likely due to COPD exacerbation. -Venous blood gas performed with elevated pCO2 levels.  If he remains somnolent, will plan to transfer to stepdown with BiPAP. -Continue scheduled breathing treatments and Solu-Medrol -Pulmonology evaluation in a.m.   Acute metabolic encephalopathy-resolved Likely due to marked CO2 narcosis, with ABG showing pH of greater than 123.  Lethargic, somnolent now improved on my evaluation awake alert and oriented x 3. -pCO2 levels remain increased on venous blood gas, continue to follow   COPD exacerbation-improving With acute hypoxic and hypercapnic respiratory failure.  Presenting with diffuse expiratory wheezing, dry cough.  Has  required intubation in the past for respiratory failure.  Chest x-ray clear.  COVID influenza RSV negative. -Solu-Medrol 125 mg given by EMS, continue Solu-Medrol 60 twice daily -IV azithromycin -DuoNebs as needed and scheduled -Mucolytics as needed -Repeat ABG in a.m. -Mucolytic's -CBGs twice daily while on steroids   Essential hypertension Blood pressure proved, hold further IV fluid Resume HCTZ    DVT prophylaxis: Lovenox Code Status: Full Family Communication: None at bedside Disposition Plan:  Status is: Inpatient Remains inpatient appropriate because: Need for IV medications.   Consultants:  None  Procedures:  None  Antimicrobials:  Anti-infectives (From admission, onward)    Start     Dose/Rate Route Frequency Ordered Stop   02/01/23 2100  azithromycin (ZITHROMAX) 500 mg in sodium chloride 0.9 % 250 mL IVPB        500 mg 250 mL/hr over 60 Minutes Intravenous Daily at bedtime 02/01/23 2054         Subjective: Patient seen and evaluated today with no new acute complaints or concerns. No acute concerns or events noted overnight.  He appears to be more alert and awake and is currently on nasal cannula.  He states that he was quite sleepy throughout the day yesterday.  Objective: Vitals:   02/03/23 1924 02/03/23 1949 02/04/23 0352 02/04/23 0829  BP:  126/60 132/77   Pulse:  84 69   Resp:  16 14   Temp:  98.7 F (37.1 C) 98.1 F (36.7 C)   TempSrc:  Oral Oral   SpO2: 93% 96% 97% 92%  Weight:      Height:        Intake/Output Summary (  Last 24 hours) at 02/04/2023 1016 Last data filed at 02/04/2023 0500 Gross per 24 hour  Intake 600 ml  Output 1900 ml  Net -1300 ml   Filed Weights   02/01/23 1335 02/01/23 2214 02/02/23 0434  Weight: 81.2 kg 72.6 kg 72.6 kg    Examination:  General exam: Appears calm and comfortable  Respiratory system: Clear to auscultation. Respiratory effort normal.  3 L nasal cannula oxygen Cardiovascular system: S1 & S2 heard,  RRR.  Gastrointestinal system: Abdomen is soft Central nervous system: Arousable and answers questions Extremities: No edema Skin: No significant lesions noted Psychiatry: Flat affect.    Data Reviewed: I have personally reviewed following labs and imaging studies  CBC: Recent Labs  Lab 02/01/23 1350 02/02/23 0625 02/03/23 0513 02/04/23 0447  WBC 6.0 5.8 6.0 4.9  HGB 12.6* 10.7* 10.7* 11.4*  HCT 44.1 37.3* 36.1* 37.9*  MCV 106.3* 104.5* 102.3* 100.0  PLT 173 162 201 209   Basic Metabolic Panel: Recent Labs  Lab 02/01/23 1350 02/02/23 0503 02/03/23 0513 02/04/23 0447  NA 138 140 139 137  K 4.1 4.0 4.3 4.1  CL 84* 88* 89* 86*  CO2 >45* 41* 44* 45*  GLUCOSE 130* 77 114* 117*  BUN 7* 8 10 13   CREATININE 0.57* 0.43* 0.54* 0.45*  CALCIUM 8.4* 8.6* 8.4* 8.6*  MG  --   --  1.9 1.8   GFR: Estimated Creatinine Clearance: 87.5 mL/min (A) (by C-G formula based on SCr of 0.45 mg/dL (L)). Liver Function Tests: No results for input(s): "AST", "ALT", "ALKPHOS", "BILITOT", "PROT", "ALBUMIN" in the last 168 hours. No results for input(s): "LIPASE", "AMYLASE" in the last 168 hours. No results for input(s): "AMMONIA" in the last 168 hours. Coagulation Profile: No results for input(s): "INR", "PROTIME" in the last 168 hours. Cardiac Enzymes: No results for input(s): "CKTOTAL", "CKMB", "CKMBINDEX", "TROPONINI" in the last 168 hours. BNP (last 3 results) No results for input(s): "PROBNP" in the last 8760 hours. HbA1C: No results for input(s): "HGBA1C" in the last 72 hours. CBG: Recent Labs  Lab 02/02/23 0020 02/02/23 0810 02/02/23 1950 02/03/23 0808 02/03/23 1951  GLUCAP 93 99 271* 84 119*   Lipid Profile: No results for input(s): "CHOL", "HDL", "LDLCALC", "TRIG", "CHOLHDL", "LDLDIRECT" in the last 72 hours. Thyroid Function Tests: No results for input(s): "TSH", "T4TOTAL", "FREET4", "T3FREE", "THYROIDAB" in the last 72 hours. Anemia Panel: No results for input(s):  "VITAMINB12", "FOLATE", "FERRITIN", "TIBC", "IRON", "RETICCTPCT" in the last 72 hours. Sepsis Labs: No results for input(s): "PROCALCITON", "LATICACIDVEN" in the last 168 hours.  Recent Results (from the past 240 hour(s))  Resp panel by RT-PCR (RSV, Flu A&B, Covid) Anterior Nasal Swab     Status: None   Collection Time: 02/01/23  2:14 PM   Specimen: Anterior Nasal Swab  Result Value Ref Range Status   SARS Coronavirus 2 by RT PCR NEGATIVE NEGATIVE Final    Comment: (NOTE) SARS-CoV-2 target nucleic acids are NOT DETECTED.  The SARS-CoV-2 RNA is generally detectable in upper respiratory specimens during the acute phase of infection. The lowest concentration of SARS-CoV-2 viral copies this assay can detect is 138 copies/mL. A negative result does not preclude SARS-Cov-2 infection and should not be used as the sole basis for treatment or other patient management decisions. A negative result may occur with  improper specimen collection/handling, submission of specimen other than nasopharyngeal swab, presence of viral mutation(s) within the areas targeted by this assay, and inadequate number of viral copies(<138 copies/mL). A negative  result must be combined with clinical observations, patient history, and epidemiological information. The expected result is Negative.  Fact Sheet for Patients:  BloggerCourse.com  Fact Sheet for Healthcare Providers:  SeriousBroker.it  This test is no t yet approved or cleared by the [place] FDA and  has been authorized for detection and/or diagnosis of SARS-CoV-2 by FDA under an Emergency Use Authorization (EUA). This EUA will remain  in effect (meaning this test can be used) for the duration of the COVID-19 declaration under Section 564(b)(1) of the Act, 21 U.S.C.section 360bbb-3(b)(1), unless the authorization is terminated  or revoked sooner.       Influenza A by PCR NEGATIVE NEGATIVE Final    Influenza B by PCR NEGATIVE NEGATIVE Final    Comment: (NOTE) The Xpert Xpress SARS-CoV-2/FLU/RSV plus assay is intended as an aid in the diagnosis of influenza from Nasopharyngeal swab specimens and should not be used as a sole basis for treatment. Nasal washings and aspirates are unacceptable for Xpert Xpress SARS-CoV-2/FLU/RSV testing.  Fact Sheet for Patients: BloggerCourse.com  Fact Sheet for Healthcare Providers: SeriousBroker.it  This test is not yet approved or cleared by the [place] FDA and has been authorized for detection and/or diagnosis of SARS-CoV-2 by FDA under an Emergency Use Authorization (EUA). This EUA will remain in effect (meaning this test can be used) for the duration of the COVID-19 declaration under Section 564(b)(1) of the Act, 21 U.S.C. section 360bbb-3(b)(1), unless the authorization is terminated or revoked.     Resp Syncytial Virus by PCR NEGATIVE NEGATIVE Final    Comment: (NOTE) Fact Sheet for Patients: BloggerCourse.com  Fact Sheet for Healthcare Providers: SeriousBroker.it  This test is not yet approved or cleared by the [place] FDA and has been authorized for detection and/or diagnosis of SARS-CoV-2 by FDA under an Emergency Use Authorization (EUA). This EUA will remain in effect (meaning this test can be used) for the duration of the COVID-19 declaration under Section 564(b)(1) of the Act, 21 U.S.C. section 360bbb-3(b)(1), unless the authorization is terminated or revoked.  Performed at [facility], [address], [place], [place] [address]   MRSA Next Gen by PCR, Nasal     Status: None   Collection Time: 02/01/23  9:50 PM   Specimen: Nasal Mucosa; Nasal Swab  Result Value Ref Range Status   MRSA by PCR Next Gen NOT DETECTED NOT DETECTED Final    Comment: (NOTE) The GeneXpert MRSA Assay (FDA approved for NASAL specimens  only), is one component of a comprehensive MRSA colonization surveillance program. It is not intended to diagnose MRSA infection nor to guide or monitor treatment for MRSA infections. Test performance is not FDA approved in patients less than 16 years old. Performed at [facility], [address], [place], [place] [address]          Radiology Studies: No results found.      Scheduled Meds:  budesonide (PULMICORT) nebulizer solution  0.25 mg Nebulization BID   enoxaparin (LOVENOX) injection  40 mg Subcutaneous QHS   hydrochlorothiazide  25 mg Oral q morning   ipratropium-albuterol  3 mL Nebulization TID   methylPREDNISolone (SOLU-MEDROL) injection  60 mg Intravenous Q12H   umeclidinium bromide  1 puff Inhalation Daily   Continuous Infusions:  azithromycin 500 mg (02/03/23 2028)     LOS: 3 days    Time spent: 35 minutes    Barbarita Hutmacher Hoover Brunette, DO [facility]  If 7PM-7AM, please contact night-coverage [email or web address] 02/04/2023, 10:16 AM

## 2023-02-05 DIAGNOSIS — J9621 Acute and chronic respiratory failure with hypoxia: Secondary | ICD-10-CM | POA: Diagnosis not present

## 2023-02-05 DIAGNOSIS — J441 Chronic obstructive pulmonary disease with (acute) exacerbation: Secondary | ICD-10-CM | POA: Diagnosis not present

## 2023-02-05 DIAGNOSIS — J9622 Acute and chronic respiratory failure with hypercapnia: Secondary | ICD-10-CM | POA: Diagnosis not present

## 2023-02-05 LAB — CBC
HCT: 38.2 % — ABNORMAL LOW (ref 39.0–52.0)
Hemoglobin: 11.5 g/dL — ABNORMAL LOW (ref 13.0–17.0)
MCH: 29.9 pg (ref 26.0–34.0)
MCHC: 30.1 g/dL (ref 30.0–36.0)
MCV: 99.2 fL (ref 80.0–100.0)
Platelets: 223 10*3/uL (ref 150–400)
RBC: 3.85 MIL/uL — ABNORMAL LOW (ref 4.22–5.81)
RDW: 12.4 % (ref 11.5–15.5)
WBC: 5.4 10*3/uL (ref 4.0–10.5)
nRBC: 0 % (ref 0.0–0.2)

## 2023-02-05 LAB — BLOOD GAS, VENOUS
Acid-Base Excess: 31.4 mmol/L — ABNORMAL HIGH (ref 0.0–2.0)
Bicarbonate: 62.5 mmol/L — ABNORMAL HIGH (ref 20.0–28.0)
Drawn by: 5114
O2 Saturation: 88.1 %
Patient temperature: 37
pCO2, Ven: 92 mmHg (ref 44–60)
pH, Ven: 7.44 — ABNORMAL HIGH (ref 7.25–7.43)
pO2, Ven: 56 mmHg — ABNORMAL HIGH (ref 32–45)

## 2023-02-05 LAB — BASIC METABOLIC PANEL
Anion gap: 5 (ref 5–15)
BUN: 12 mg/dL (ref 8–23)
CO2: 45 mmol/L — ABNORMAL HIGH (ref 22–32)
Calcium: 8.5 mg/dL — ABNORMAL LOW (ref 8.9–10.3)
Chloride: 86 mmol/L — ABNORMAL LOW (ref 98–111)
Creatinine, Ser: 0.63 mg/dL (ref 0.61–1.24)
GFR, Estimated: >60 mL/min (ref >60)
Glucose, Bld: 225 mg/dL — ABNORMAL HIGH (ref 70–99)
Potassium: 3.9 mmol/L (ref 3.5–5.1)
Sodium: 136 mmol/L (ref 135–145)

## 2023-02-05 LAB — GLUCOSE, CAPILLARY: Glucose-Capillary: 161 mg/dL — ABNORMAL HIGH (ref 70–99)

## 2023-02-05 LAB — MAGNESIUM: Magnesium: 1.7 mg/dL (ref 1.7–2.4)

## 2023-02-05 LAB — TSH: TSH: 0.613 u[IU]/mL (ref 0.350–4.500)

## 2023-02-05 NOTE — Progress Notes (Signed)
PROGRESS NOTE    Miguel Marsh  ZOX:[id number] DOB: Apr 25, 1961 DOA: 02/01/2023 PCP: [name], NP   Brief Narrative:    Miguel Marsh is a 62 y.o. male with medical history significant for COPD with chronic respiratory failure on 3 L, hypertension, right lung cancer. Patient was brought to the ED via EMS with complaints of difficulty breathing over the past 3 days.  Patient was admitted with acute on chronic hypoxemic and hypercapnic respiratory failure and started on BiPAP.  Miguel Marsh is also noted to have hypercapnic encephalopathy in the setting of COPD exacerbation.  Miguel Marsh continues to have high oxygen requirement as well as elevated pCO2 levels.  Plan to continue monitoring with pulmonology evaluation in a.m.  Assessment & Plan:   Principal Problem:   Acute on chronic respiratory failure with hypoxia and hypercapnia Active Problems:   COPD exacerbation   Acute metabolic encephalopathy   Essential hypertension   Tobacco use disorder  Assessment and Plan:   Acute on chronic respiratory failure with hypoxia and hypercapnia Initial ABG showing pH of 7.2, pCO2 of 123, pO2 of 59.  BMP shows bicarb of greater than 45, baseline high 30s, suggesting chronic retention.  Initial ED lethargic/somnolent in the ED, this improved.  Likely due to COPD exacerbation. -Venous blood gas performed with elevated pCO2 levels.  If Miguel Marsh remains somnolent, will plan to transfer to stepdown with BiPAP. -Continue scheduled breathing treatments and Solu-Medrol -Pulmonology evaluation pending today; may require NIV on discharge -TSH ordered and pending   Acute metabolic encephalopathy-resolved Likely due to marked CO2 narcosis, with ABG showing pH of greater than 123.  Lethargic, somnolent now improved on my evaluation awake alert and oriented x 3. -pCO2 levels remain increased on venous blood gas, continue to follow   COPD exacerbation-improving With acute hypoxic and hypercapnic respiratory failure.   Presenting with diffuse expiratory wheezing, dry cough.  Has required intubation in the past for respiratory failure.  Chest x-ray clear.  COVID influenza RSV negative. -Solu-Medrol 125 mg given by EMS, continue Solu-Medrol 60 twice daily -IV azithromycin -DuoNebs as needed and scheduled -Mucolytics as needed -Repeat ABG in a.m. -Mucolytic's -CBGs twice daily while on steroids   Essential hypertension Blood pressure proved, hold further IV fluid Resume HCTZ    DVT prophylaxis: Lovenox Code Status: Full Family Communication: None at bedside Disposition Plan:  Status is: Inpatient Remains inpatient appropriate because: Need for IV medications.   Consultants:  Pulmonology  Procedures:  None  Antimicrobials:  Anti-infectives (From admission, onward)    Start     Dose/Rate Route Frequency Ordered Stop   02/01/23 2100  azithromycin (ZITHROMAX) 500 mg in sodium chloride 0.9 % 250 mL IVPB        500 mg 250 mL/hr over 60 Minutes Intravenous Daily at bedtime 02/01/23 2054         Subjective: Patient seen and evaluated today with no new acute complaints or concerns. No acute concerns or events noted overnight.  Miguel Marsh appears to be more alert and awake and is currently on nasal cannula.  Miguel Marsh is slowly starting to feel better.  Objective: Vitals:   02/05/23 0500 02/05/23 0803 02/05/23 0806 02/05/23 0808  BP: 118/75     Pulse: 84     Resp: 18     Temp: 98.5 F (36.9 C)     TempSrc: Oral     SpO2: 92% 94% 97% 100%  Weight:      Height:  Intake/Output Summary (Last 24 hours) at 02/05/2023 0908 Last data filed at 02/05/2023 0758 Gross per 24 hour  Intake --  Output 2075 ml  Net -2075 ml   Filed Weights   02/01/23 1335 02/01/23 2214 02/02/23 0434  Weight: 81.2 kg 72.6 kg 72.6 kg    Examination:  General exam: Appears calm and comfortable  Respiratory system: Clear to auscultation. Respiratory effort normal.  3 L nasal cannula oxygen Cardiovascular system: S1 & S2  heard, RRR.  Gastrointestinal system: Abdomen is soft Central nervous system: Arousable and answers questions Extremities: No edema Skin: No significant lesions noted Psychiatry: Flat affect.    Data Reviewed: I have personally reviewed following labs and imaging studies  CBC: Recent Labs  Lab 02/01/23 1350 02/02/23 0625 02/03/23 0513 02/04/23 0447 02/05/23 0406  WBC 6.0 5.8 6.0 4.9 5.4  HGB 12.6* 10.7* 10.7* 11.4* 11.5*  HCT 44.1 37.3* 36.1* 37.9* 38.2*  MCV 106.3* 104.5* 102.3* 100.0 99.2  PLT 173 162 201 209 223   Basic Metabolic Panel: Recent Labs  Lab 02/01/23 1350 02/02/23 0503 02/03/23 0513 02/04/23 0447 02/05/23 0406  NA 138 140 139 137 136  K 4.1 4.0 4.3 4.1 3.9  CL 84* 88* 89* 86* 86*  CO2 >45* 41* 44* 45* 45*  GLUCOSE 130* 77 114* 117* 225*  BUN 7* 8 10 13 12   CREATININE 0.57* 0.43* 0.54* 0.45* 0.63  CALCIUM 8.4* 8.6* 8.4* 8.6* 8.5*  MG  --   --  1.9 1.8 1.7   GFR: Estimated Creatinine Clearance: 87.5 mL/min (by C-G formula based on SCr of 0.63 mg/dL). Liver Function Tests: No results for input(s): "AST", "ALT", "ALKPHOS", "BILITOT", "PROT", "ALBUMIN" in the last 168 hours. No results for input(s): "LIPASE", "AMYLASE" in the last 168 hours. No results for input(s): "AMMONIA" in the last 168 hours. Coagulation Profile: No results for input(s): "INR", "PROTIME" in the last 168 hours. Cardiac Enzymes: No results for input(s): "CKTOTAL", "CKMB", "CKMBINDEX", "TROPONINI" in the last 168 hours. BNP (last 3 results) No results for input(s): "PROBNP" in the last 8760 hours. HbA1C: No results for input(s): "HGBA1C" in the last 72 hours. CBG: Recent Labs  Lab 02/02/23 0020 02/02/23 0810 02/02/23 1950 02/03/23 0808 02/03/23 1951  GLUCAP 93 99 271* 84 119*   Lipid Profile: No results for input(s): "CHOL", "HDL", "LDLCALC", "TRIG", "CHOLHDL", "LDLDIRECT" in the last 72 hours. Thyroid Function Tests: No results for input(s): "TSH", "T4TOTAL",  "FREET4", "T3FREE", "THYROIDAB" in the last 72 hours. Anemia Panel: No results for input(s): "VITAMINB12", "FOLATE", "FERRITIN", "TIBC", "IRON", "RETICCTPCT" in the last 72 hours. Sepsis Labs: No results for input(s): "PROCALCITON", "LATICACIDVEN" in the last 168 hours.  Recent Results (from the past 240 hour(s))  Resp panel by RT-PCR (RSV, Flu A&B, Covid) Anterior Nasal Swab     Status: None   Collection Time: 02/01/23  2:14 PM   Specimen: Anterior Nasal Swab  Result Value Ref Range Status   SARS Coronavirus 2 by RT PCR NEGATIVE NEGATIVE Final    Comment: (NOTE) SARS-CoV-2 target nucleic acids are NOT DETECTED.  The SARS-CoV-2 RNA is generally detectable in upper respiratory specimens during the acute phase of infection. The lowest concentration of SARS-CoV-2 viral copies this assay can detect is 138 copies/mL. A negative result does not preclude SARS-Cov-2 infection and should not be used as the sole basis for treatment or other patient management decisions. A negative result may occur with  improper specimen collection/handling, submission of specimen other than nasopharyngeal swab, presence of viral  mutation(s) within the areas targeted by this assay, and inadequate number of viral copies(<138 copies/mL). A negative result must be combined with clinical observations, patient history, and epidemiological information. The expected result is Negative.  Fact Sheet for Patients:  BloggerCourse.comhttps://www.fda.gov/media/152166/download  Fact Sheet for Healthcare Providers:  SeriousBroker.ithttps://www.fda.gov/media/152162/download  This test is no t yet approved or cleared by the [place] FDA and  has been authorized for detection and/or diagnosis of SARS-CoV-2 by FDA under an Emergency Use Authorization (EUA). This EUA will remain  in effect (meaning this test can be used) for the duration of the COVID-19 declaration under Section 564(b)(1) of the Act, 21 U.S.C.section 360bbb-3(b)(1), unless the  authorization is terminated  or revoked sooner.       Influenza A by PCR NEGATIVE NEGATIVE Final   Influenza B by PCR NEGATIVE NEGATIVE Final    Comment: (NOTE) The Xpert Xpress SARS-CoV-2/FLU/RSV plus assay is intended as an aid in the diagnosis of influenza from Nasopharyngeal swab specimens and should not be used as a sole basis for treatment. Nasal washings and aspirates are unacceptable for Xpert Xpress SARS-CoV-2/FLU/RSV testing.  Fact Sheet for Patients: BloggerCourse.comhttps://www.fda.gov/media/152166/download  Fact Sheet for Healthcare Providers: SeriousBroker.ithttps://www.fda.gov/media/152162/download  This test is not yet approved or cleared by the [place] FDA and has been authorized for detection and/or diagnosis of SARS-CoV-2 by FDA under an Emergency Use Authorization (EUA). This EUA will remain in effect (meaning this test can be used) for the duration of the COVID-19 declaration under Section 564(b)(1) of the Act, 21 U.S.C. section 360bbb-3(b)(1), unless the authorization is terminated or revoked.     Resp Syncytial Virus by PCR NEGATIVE NEGATIVE Final    Comment: (NOTE) Fact Sheet for Patients: BloggerCourse.comhttps://www.fda.gov/media/152166/download  Fact Sheet for Healthcare Providers: SeriousBroker.ithttps://www.fda.gov/media/152162/download  This test is not yet approved or cleared by the [place] FDA and has been authorized for detection and/or diagnosis of SARS-CoV-2 by FDA under an Emergency Use Authorization (EUA). This EUA will remain in effect (meaning this test can be used) for the duration of the COVID-19 declaration under Section 564(b)(1) of the Act, 21 U.S.C. section 360bbb-3(b)(1), unless the authorization is terminated or revoked.  Performed at [facility], [address] [place], [place] [address]   MRSA Next Gen by PCR, Nasal     Status: None   Collection Time: 02/01/23  9:50 PM   Specimen: Nasal Mucosa; Nasal Swab  Result Value Ref Range Status   MRSA by PCR Next Gen NOT  DETECTED NOT DETECTED Final    Comment: (NOTE) The GeneXpert MRSA Assay (FDA approved for NASAL specimens only), is one component of a comprehensive MRSA colonization surveillance program. It is not intended to diagnose MRSA infection nor to guide or monitor treatment for MRSA infections. Test performance is not FDA approved in patients less than 62 years old. Performed at [facility], [address] [place], [place] [address]          Radiology Studies: No results found.      Scheduled Meds:  budesonide (PULMICORT) nebulizer solution  0.25 mg Nebulization BID   enoxaparin (LOVENOX) injection  40 mg Subcutaneous QHS   hydrochlorothiazide  25 mg Oral q morning   ipratropium-albuterol  3 mL Nebulization TID   methylPREDNISolone (SOLU-MEDROL) injection  60 mg Intravenous Q12H   umeclidinium bromide  1 puff Inhalation Daily   Continuous Infusions:  azithromycin 500 mg (02/04/23 2057)     LOS: 4 days    Time spent: 35 minutes    Kaiyu Mirabal  Hoover Brunette, DO [facility]  If 7PM-7AM, please contact night-coverage [email or web address] 02/05/2023, 9:08 AM

## 2023-02-05 NOTE — Progress Notes (Signed)
Date and time results received: 02/05/23 0810 (use smartphrase ".now" to insert current time)  Test: PCO2 Critical Value:   Name of Provider Notified: 31 Orders Received? Or Actions Taken?: No new orders

## 2023-02-05 NOTE — Progress Notes (Addendum)
Mobility Specialist Progress Note:    02/05/23 0953  Mobility  Activity Ambulated with assistance in hallway  Level of Assistance Standby assist, set-up cues, supervision of patient - no hands on  Assistive Device Front wheel walker  Distance Ambulated (ft) 140 ft  Activity Response Tolerated well  Mobility Referral Yes  $Mobility charge 1 Mobility   Pt agreeable to mobility session. Tolerated well, c/o dizziness and SOB throughout. Took 2 standing rest breaks d/t fatigue and SOB. Pt required RW for this session for safety, reports "feeling shaky." SpO2 93% on 3L during ambulation. Returned pt to room, sitting EOB, all needs met.   Feliciana Rossetti Mobility Specialist Please contact via Special educational needs teacher or  Rehab office at [phone]

## 2023-02-05 NOTE — Consult Note (Addendum)
NAME:  Miguel Marsh, MRN:  [id number], DOB:  22-Aug-1961, LOS: 4 ADMISSION DATE:  02/01/2023, CONSULTATION DATE:  02/05/23  REFERRING MD:  Sherryll BurgerShah, CHIEF COMPLAINT:  resp distress   History of Present Illness:  [age] yobm with h/o ? 3B lung ca quit smoking 2 weeks PTA with very severe copd at baseline = last PFT 2019 GOLD IV with relatively preserved DLCO per [facility] where has had all his prev f/u and maint on breztri / 3lpm 24/7 with doe x 50 ft graduall worse since 1 month PTA assoc with worsening cough and less response to saba in hfa and neb forms with finding of severe hypercarbic resp failure on initial eval and PCCM consulted am 02/05/23 prior to d/c / rx for AECOPD back ? Near baseline ?   Pertinent  Medical History  62 y.o. male with medical history significant for COPD with chronic respiratory failure on 3 L, hypertension, right lung cancer. Patient was brought to the ED via EMS with complaints of difficulty breathing over the past 3 days.  He also reports a dry cough with generalized bodyaches.  No leg swelling.  No chest pain. Per EMS O2 sats were 85% on 3 L.     ED Course: Temperature 98.1.  Heart rate 77-104, respiratory rate 13-32, blood pressure systolic 89 to 150s, O2 sats initially ranging from 86 to 98%, on nasal cannula and nonrebreather, he was subsequently switched to BiPAP with VBG results showing pH of 7.1, pCO2 was not calculated.  Subsequent ABG shows pH of 7.26, pCO2 of greater than 123, after 2 hours on BiPAP, repeat ABG showed improved pH to 7.3, pCO2 of 123. Chest x-ray negative for acute abnormality. LR 500 mL given, 3.3 mg epinephrine, 2 g magnesium, continuous albuterol neb given. Patient was initially drowsy/somnolent, later improved with BiPAP. Hospitalist to admit for acute on chronic hypoxic and hypercapnic respiratory failure.  Significant Hospital Events: Including procedures, antibiotic start and stop dates in addition to other pertinent events     Scheduled Meds:   budesonide (PULMICORT) nebulizer solution  0.25 mg Nebulization BID   enoxaparin (LOVENOX) injection  40 mg Subcutaneous QHS   hydrochlorothiazide  25 mg Oral q morning   ipratropium-albuterol  3 mL Nebulization TID   methylPREDNISolone (SOLU-MEDROL) injection  60 mg Intravenous Q12H   umeclidinium bromide  1 puff Inhalation Daily   Continuous Infusions:  azithromycin 500 mg (02/04/23 2057)   PRN Meds:.acetaminophen **OR** acetaminophen, clonazePAM, ipratropium-albuterol, ondansetron **OR** ondansetron (ZOFRAN) IV, oxyCODONE, polyethylene glycol    Interim History / Subjective:  Says back near baseline but has persistent severe dry cough and throat clearing, no cp or resting sob or dysphagia/ sinus ccs  Objective   Blood pressure 118/75, pulse 84, temperature 98.5 F (36.9 C), temperature source Oral, resp. rate 18, height 5\' 6"  (1.676 m), weight 72.6 kg, SpO2 100 %.        Intake/Output Summary (Last 24 hours) at 02/05/2023 1328 Last data filed at 02/05/2023 0758 Gross per 24 hour  Intake --  Output 2075 ml  Net -2075 ml   Filed Weights   02/01/23 1335 02/01/23 2214 02/02/23 0434  Weight: 81.2 kg 72.6 kg 72.6 kg    Examination: Tmax:  98.5 General appearance:    elderly bm > state age alert sitting up eating lunch from bedside table   At Rest 02 sats  100% on 3lpm   No jvd Oropharynx clear,  mucosa nl Neck supple Lungs with very distant bs  bilaterally  RRR no s3 or or sign murmur Abd soft with limited insp  excursion  Extr warm with no edema or clubbing noted Neuro  Sensorium intact ,  no apparent motor deficits    I personally reviewed images and agree with radiology impression as follows:  CXR:   portable 02/01/23 No evidence of acute cardiopulmonary disease.  Nodular right basilar opacity could correspond to nodularity seen on prior chest CT in December 2021. Recommend correlation with any more recent chest CT, if available. Otherwise, a chest CT is recommended for  direct comparison. My review: no acute changes/ really not badly hyperinflated   Resolved Hospital Problem list     Assessment & Plan:  1)  GOLD 4/ Group E copd complicated by profound but well compensated hypercarbic and hypoxemic resp failure  >>> may benefit from NIV at d/c  but advise check TSh first and minimize narcs/ benzo's if possible >>>  Group D (now reclassified as E) in terms of symptom/risk and laba/lama/ICS  therefore appropriate rx at this point >>>  breztri 2 bid best choice plus approp saba: Re SABA :  I spent extra time with pt today reviewing appropriate use of albuterol for prn use on exertion with the following points: 1) saba is for relief of sob that does not improve by walking a slower pace or resting but rather if the pt does not improve after trying this first. 2) If the pt is convinced, as many are, that saba helps recover from activity faster then it's easy to tell if this is the case by re-challenging : ie stop, take the inhaler, then p 5 minutes try the exact same activity (intensity of workload) that just caused the symptoms and see if they are substantially diminished or not after saba 3) if there is an activity that reproducibly causes the symptoms, try the saba 15 min before the activity on alternate days   If in fact the saba really does help, then fine to continue to use it prn but advised may need to look closer at the maintenance regimen being used to achieve better control of airways disease with exertion.  >>> titrate 02 to sats lower 90s's / no higher      2) former smoker/ h/o 3B lung ca per care everywhere [facility] his [facility] so rec that he re-establish at [facility] for both oncology and pulmonary medicine ...  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  It's also why I strongly rec DNI status (ventilating pts down to a nl pC02 adversely affects this compensatory mechanism). If does chose DNI status I would not deny him  palliative drugs but it makes no sense to me to try to  have it both ways (palliative care plus vents don't mix well).  I discussed this with the pt and did not get a feeling he's heard any of this before from [facility] so offered to see in office if he desires with all meds in hand using a trust but verify approach to confirm accurate Medication  Reconciliation The principal here is that until we are certain that the  patients are doing what we've asked, it makes no sense to ask them to do more.    3) Ferq  throat clearing typical of Upper airway cough syndrome (previously labeled PNDS),  is so named because it's frequently impossible to sort out how much is  CR/sinusitis with freq throat clearing (which can be related to primary GERD)   vs  causing  secondary (" extra esophageal")  GERD from wide swings in gastric pressure that occur with throat clearing, often  promoting self use of mint and menthol lozenges that reduce the lower esophageal sphincter tone and exacerbate the problem further in a cyclical fashion.   These are the same pts (now being labeled as having "irritable larynx syndrome" by some cough centers) who not infrequently have a history of having failed to  tolerate ace inhibitors,  dry powder inhalers or biphosphonates or report having atypical/extraesophageal reflux symptoms that don't respond to standard doses of PPI  and are easily confused as having aecopd or asthma flares by even experienced allergists/ pulmonologists (myself included).  >>> rec avoid use of DPI's in favoer of hfa breztri at outpt and max rx for gerd x 6 week trial  Labs   CBC: Recent Labs  Lab 02/01/23 1350 02/02/23 0625 02/03/23 0513 02/04/23 0447 02/05/23 0406  WBC 6.0 5.8 6.0 4.9 5.4  HGB 12.6* 10.7* 10.7* 11.4* 11.5*  HCT 44.1 37.3* 36.1* 37.9* 38.2*  MCV 106.3* 104.5* 102.3* 100.0 99.2  PLT 173 162 201 209 223    Basic Metabolic Panel: Recent Labs  Lab 02/01/23 1350 02/02/23 0503 02/03/23 0513 02/04/23 0447 02/05/23 0406  NA 138 140 139 137 136  K 4.1 4.0 4.3 4.1 3.9  CL 84* 88* 89* 86* 86*  CO2 >45* 41* 44* 45* 45*  GLUCOSE 130* 77 114* 117* 225*  BUN 7* 8 10 13 12   CREATININE 0.57* 0.43* 0.54* 0.45* 0.63  CALCIUM 8.4* 8.6* 8.4* 8.6* 8.5*  MG  --   --  1.9 1.8 1.7   GFR: Estimated Creatinine Clearance: 87.5 mL/min (by C-G formula based on SCr of 0.63 mg/dL). Recent Labs  Lab 02/02/23 0625 02/03/23 0513 02/04/23 0447 02/05/23 0406  WBC 5.8 6.0 4.9 5.4    Liver Function Tests: No results for input(s): "AST", "ALT", "ALKPHOS", "BILITOT", "PROT", "ALBUMIN" in the last 168 hours. No results for input(s): "LIPASE", "AMYLASE" in the last 168 hours. No results for input(s): "AMMONIA" in the last 168 hours.  ABG    Component Value Date/Time   PHART 7.44 02/02/2023 0845   PCO2ART 76 (HH) 02/02/2023 0845   PO2ART 45 (L) 02/02/2023 0845   HCO3 62.5 (H) 02/05/2023 0745   TCO2 42 (H) 01/23/2020 0519   O2SAT 88.1 02/05/2023 0745     Coagulation Profile: No results for input(s): "INR", "PROTIME" in the last 168 hours.  Cardiac Enzymes: No results for input(s): "CKTOTAL", "CKMB", "CKMBINDEX", "TROPONINI" in the last 168  hours.  HbA1C: Hgb A1c MFr Bld  Date/Time Value Ref Range Status  06/09/2022  06:15 AM 5.2 4.8 - 5.6 % Final    Comment:    (NOTE) Pre diabetes:          5.7%-6.4%  Diabetes:              >6.4%  Glycemic control for   <7.0% adults with diabetes   05/12/2021 09:04 PM 6.0 (H) 4.8 - 5.6 % Final    Comment:    (NOTE) Pre diabetes:          5.7%-6.4%  Diabetes:              >6.4%  Glycemic control for   <7.0% adults with diabetes     CBG: Recent Labs  Lab 02/02/23 0020 02/02/23 0810 02/02/23 1950 02/03/23 0808 02/03/23 1951  GLUCAP 93 99 271* 84 119*       Past Medical History:  He,  has a past medical history of Arthritis, Bronchitis, Cancer, COPD (chronic obstructive pulmonary disease), and HTN (hypertension).   Surgical History:   Past Surgical History:  Procedure Laterality Date   LUNG BIOPSY     TOTAL HIP ARTHROPLASTY     TUMOR REMOVAL Right 12/04/2017     Social History:   reports that he has quit smoking. His smoking use included cigarettes. He smoked an average of .1 packs per day. He has never used smokeless tobacco. He reports current alcohol use. He reports that he does not use drugs.   Family History:  His family history is not on file.   Allergies No Known Allergies   Home Medications  Prior to Admission medications   Medication Sig Start Date End Date Taking? Authorizing Provider  albuterol (PROVENTIL HFA;VENTOLIN HFA) 108 (90 Base) MCG/ACT inhaler Inhale 2 puffs into the lungs every 6 (six) hours as needed for wheezing. 07/17/18  Yes Shon Hale, MD  albuterol (PROVENTIL) (2.5 MG/3ML) 0.083% nebulizer solution Inhale 2.5 mg into the lungs every 6 (six) hours as needed for wheezing or shortness of breath.  01/19/20  Yes [provider]  BREZTRI AEROSPHERE 160-9-4.8 MCG/ACT AERO Inhale 2 puffs into the lungs 2 (two) times daily. 05/17/22  Yes [provider]  clonazePAM (KLONOPIN) 1 MG tablet Take 1 mg by mouth 2 (two)  times daily. 05/18/22  Yes [provider]  hydrochlorothiazide (HYDRODIURIL) 25 MG tablet Take 25 mg by mouth every morning. 07/02/20  Yes [provider]  omeprazole (PRILOSEC) 40 MG capsule Take 1 capsule by mouth daily. 11/06/16  Yes [provider]  oxyCODONE (ROXICODONE) 15 MG immediate release tablet Take 15 mg by mouth 4 (four) times daily as needed for pain.  08/13/20  Yes [provider]  OXYGEN Inhale 3 L into the lungs continuous.    Yes [provider]      Sandrea Hughs, MD Pulmonary and Critical Care Medicine [facility] Cell [phone]   After 7:00 pm call Elink  [phone]

## 2023-02-06 ENCOUNTER — Inpatient Hospital Stay (HOSPITAL_COMMUNITY): Payer: 59

## 2023-02-06 DIAGNOSIS — J441 Chronic obstructive pulmonary disease with (acute) exacerbation: Secondary | ICD-10-CM | POA: Diagnosis not present

## 2023-02-06 DIAGNOSIS — J9622 Acute and chronic respiratory failure with hypercapnia: Secondary | ICD-10-CM | POA: Diagnosis not present

## 2023-02-06 DIAGNOSIS — J9621 Acute and chronic respiratory failure with hypoxia: Secondary | ICD-10-CM | POA: Diagnosis not present

## 2023-02-06 LAB — GLUCOSE, CAPILLARY
Glucose-Capillary: 163 mg/dL — ABNORMAL HIGH (ref 70–99)
Glucose-Capillary: 157 mg/dL — ABNORMAL HIGH (ref 70–99)
Glucose-Capillary: 147 mg/dL — ABNORMAL HIGH (ref 70–99)

## 2023-02-06 MED ORDER — PANTOPRAZOLE SODIUM 40 MG PO TBEC
40.0000 mg | DELAYED_RELEASE_TABLET | Freq: Two times a day (BID) | ORAL | Status: DC
  Administered 2023-02-06 – 2023-02-08 (×3): 40 mg via ORAL
  Filled 2023-02-06 (×4): qty 1

## 2023-02-06 MED ORDER — ALBUTEROL SULFATE (2.5 MG/3ML) 0.083% IN NEBU: 2.5000 mg | INHALATION_SOLUTION | RESPIRATORY_TRACT | Status: DC | PRN

## 2023-02-06 MED ORDER — IPRATROPIUM-ALBUTEROL 0.5-2.5 (3) MG/3ML IN SOLN
3.0000 mL | Freq: Four times a day (QID) | RESPIRATORY_TRACT | Status: DC
  Administered 2023-02-06 – 2023-02-08 (×6): 3 mL via RESPIRATORY_TRACT
  Filled 2023-02-06 (×5): qty 3

## 2023-02-06 NOTE — Progress Notes (Signed)
Was called into pt's room with reports of feeling dizzy. Manual BP 160/90, O2 97% on 3L. Pt states, "I feel jittery like I would if I was hungry." CBG 152. Gave pt 4 oz soda with graham crackers. Pt states, " I'm starting to feel better." MD and charge notified. Will continue to monitor.

## 2023-02-06 NOTE — Progress Notes (Signed)
PROGRESS NOTE    Miguel Marsh  WRU:[id number] DOB: [birth year] DOA: 02/01/2023 PCP: [name], NP   Brief Narrative:    Miguel Marsh is a 62 y.o. male with medical history significant for COPD with chronic respiratory failure on 3 L, hypertension, right lung cancer. Patient was brought to the ED via EMS with complaints of difficulty breathing over the past 3 days.  Patient was admitted with acute on chronic hypoxemic and hypercapnic respiratory failure and started on BiPAP.  He is also noted to have hypercapnic encephalopathy in the setting of COPD exacerbation.  He continues to have high oxygen requirement as well as elevated pCO2 levels.  Plan to continue monitoring with pulmonology evaluation ongoing.  Patient still does not appear stable for discharge and this was discussed with pulmonology with plans to recheck chest x-ray today.  Assessment & Plan:   Principal Problem:   Acute on chronic respiratory failure with hypoxia and hypercapnia Active Problems:   COPD exacerbation   Acute metabolic encephalopathy   Essential hypertension   Tobacco use disorder  Assessment and Plan:   Acute on chronic respiratory failure with hypoxia and hypercapnia Initial ABG showing pH of 7.2, pCO2 of 123, pO2 of 59.  BMP shows bicarb of greater than 45, baseline high 30s, suggesting chronic retention.  Initial ED lethargic/somnolent in the ED, this improved.  Likely due to COPD exacerbation. -Venous blood gas performed with elevated pCO2 levels.  If he remains somnolent, will plan to transfer to stepdown with BiPAP. -Continue scheduled breathing treatments and Solu-Medrol -Pulmonology evaluation ongoing plans for repeat chest x-ray today and PPI to twice daily dosing -TSH 0.6   Acute metabolic encephalopathy-resolved Likely due to marked CO2 narcosis, with ABG showing pH of greater than 123.  Lethargic, somnolent now improved on my evaluation awake alert and oriented x 3. -pCO2 levels  remain increased on venous blood gas, continue to follow   COPD exacerbation-improving With acute hypoxic and hypercapnic respiratory failure.  Presenting with diffuse expiratory wheezing, dry cough.  Has required intubation in the past for respiratory failure.  Chest x-ray clear.  COVID influenza RSV negative. -Solu-Medrol 125 mg given by EMS, continue Solu-Medrol 60 twice daily -IV azithromycin -DuoNebs as needed and scheduled -Mucolytics as needed -Repeat ABG in a.m. -Mucolytic's -CBGs twice daily while on steroids   Essential hypertension Blood pressure proved, hold further IV fluid Resume HCTZ    DVT prophylaxis: Lovenox Code Status: Full Family Communication: None at bedside Disposition Plan:  Status is: Inpatient Remains inpatient appropriate because: Need for IV medications.   Consultants:  Pulmonology  Procedures:  None  Antimicrobials:  Anti-infectives (From admission, onward)    Start     Dose/Rate Route Frequency Ordered Stop   02/01/23 2100  azithromycin (ZITHROMAX) 500 mg in sodium chloride 0.9 % 250 mL IVPB        500 mg 250 mL/hr over 60 Minutes Intravenous Daily at bedtime 02/01/23 2054         Subjective: Patient seen and evaluated today with no new acute complaints or concerns. No acute concerns or events noted overnight.  He continues to have some difficulty with shortness of breath and states that he does not feel ready for discharge.  Objective: Vitals:   02/06/23 0757 02/06/23 0758 02/06/23 0800 02/06/23 1227  BP:    99/79  Pulse:    97  Resp:    20  Temp:    99.5 F (37.5 C)  TempSrc:  Oral  SpO2: 97% 100% 100% 100%  Weight:      Height:        Intake/Output Summary (Last 24 hours) at 02/06/2023 1314 Last data filed at 02/06/2023 0900 Gross per 24 hour  Intake 1239.86 ml  Output 250 ml  Net 989.86 ml   Filed Weights   02/01/23 1335 02/01/23 2214 02/02/23 0434  Weight: 81.2 kg 72.6 kg 72.6 kg    Examination:  General exam:  Appears calm and comfortable  Respiratory system: Clear to auscultation. Respiratory effort normal.  3 L nasal cannula oxygen Cardiovascular system: S1 & S2 heard, RRR.  Gastrointestinal system: Abdomen is soft Central nervous system: Arousable and answers questions Extremities: No edema Skin: No significant lesions noted Psychiatry: Flat affect.    Data Reviewed: I have personally reviewed following labs and imaging studies  CBC: Recent Labs  Lab 02/01/23 1350 02/02/23 0625 02/03/23 0513 02/04/23 0447 02/05/23 0406  WBC 6.0 5.8 6.0 4.9 5.4  HGB 12.6* 10.7* 10.7* 11.4* 11.5*  HCT 44.1 37.3* 36.1* 37.9* 38.2*  MCV 106.3* 104.5* 102.3* 100.0 99.2  PLT 173 162 201 209 223   Basic Metabolic Panel: Recent Labs  Lab 02/01/23 1350 02/02/23 0503 02/03/23 0513 02/04/23 0447 02/05/23 0406  NA 138 140 139 137 136  K 4.1 4.0 4.3 4.1 3.9  CL 84* 88* 89* 86* 86*  CO2 >45* 41* 44* 45* 45*  GLUCOSE 130* 77 114* 117* 225*  BUN 7* 8 10 13 12   CREATININE 0.57* 0.43* 0.54* 0.45* 0.63  CALCIUM 8.4* 8.6* 8.4* 8.6* 8.5*  MG  --   --  1.9 1.8 1.7   GFR: Estimated Creatinine Clearance: 87.5 mL/min (by C-G formula based on SCr of 0.63 mg/dL). Liver Function Tests: No results for input(s): "AST", "ALT", "ALKPHOS", "BILITOT", "PROT", "ALBUMIN" in the last 168 hours. No results for input(s): "LIPASE", "AMYLASE" in the last 168 hours. No results for input(s): "AMMONIA" in the last 168 hours. Coagulation Profile: No results for input(s): "INR", "PROTIME" in the last 168 hours. Cardiac Enzymes: No results for input(s): "CKTOTAL", "CKMB", "CKMBINDEX", "TROPONINI" in the last 168 hours. BNP (last 3 results) No results for input(s): "PROBNP" in the last 8760 hours. HbA1C: No results for input(s): "HGBA1C" in the last 72 hours. CBG: Recent Labs  Lab 02/02/23 1950 02/03/23 0808 02/03/23 1951 02/05/23 1828 02/06/23 0608  GLUCAP 271* 84 119* 161* 163*   Lipid Profile: No results for  input(s): "CHOL", "HDL", "LDLCALC", "TRIG", "CHOLHDL", "LDLDIRECT" in the last 72 hours. Thyroid Function Tests: Recent Labs    02/05/23 0528  TSH 0.613   Anemia Panel: No results for input(s): "VITAMINB12", "FOLATE", "FERRITIN", "TIBC", "IRON", "RETICCTPCT" in the last 72 hours. Sepsis Labs: No results for input(s): "PROCALCITON", "LATICACIDVEN" in the last 168 hours.  Recent Results (from the past 240 hour(s))  Resp panel by RT-PCR (RSV, Flu A&B, Covid) Anterior Nasal Swab     Status: None   Collection Time: 02/01/23  2:14 PM   Specimen: Anterior Nasal Swab  Result Value Ref Range Status   SARS Coronavirus 2 by RT PCR NEGATIVE NEGATIVE Final    Comment: (NOTE) SARS-CoV-2 target nucleic acids are NOT DETECTED.  The SARS-CoV-2 RNA is generally detectable in upper respiratory specimens during the acute phase of infection. The lowest concentration of SARS-CoV-2 viral copies this assay can detect is 138 copies/mL. A negative result does not preclude SARS-Cov-2 infection and should not be used as the sole basis for treatment or other patient management  decisions. A negative result may occur with  improper specimen collection/handling, submission of specimen other than nasopharyngeal swab, presence of viral mutation(s) within the areas targeted by this assay, and inadequate number of viral copies(<138 copies/mL). A negative result must be combined with clinical observations, patient history, and epidemiological information. The expected result is Negative.  Fact Sheet for Patients:  BloggerCourse.com  Fact Sheet for Healthcare Providers:  SeriousBroker.it  This test is no t yet approved or cleared by the [place] FDA and  has been authorized for detection and/or diagnosis of SARS-CoV-2 by FDA under an Emergency Use Authorization (EUA). This EUA will remain  in effect (meaning this test can be used) for the duration of  the COVID-19 declaration under Section 564(b)(1) of the Act, 21 U.S.C.section 360bbb-3(b)(1), unless the authorization is terminated  or revoked sooner.       Influenza A by PCR NEGATIVE NEGATIVE Final   Influenza B by PCR NEGATIVE NEGATIVE Final    Comment: (NOTE) The Xpert Xpress SARS-CoV-2/FLU/RSV plus assay is intended as an aid in the diagnosis of influenza from Nasopharyngeal swab specimens and should not be used as a sole basis for treatment. Nasal washings and aspirates are unacceptable for Xpert Xpress SARS-CoV-2/FLU/RSV testing.  Fact Sheet for Patients: BloggerCourse.com  Fact Sheet for Healthcare Providers: SeriousBroker.it  This test is not yet approved or cleared by the [place] FDA and has been authorized for detection and/or diagnosis of SARS-CoV-2 by FDA under an Emergency Use Authorization (EUA). This EUA will remain in effect (meaning this test can be used) for the duration of the COVID-19 declaration under Section 564(b)(1) of the Act, 21 U.S.C. section 360bbb-3(b)(1), unless the authorization is terminated or revoked.     Resp Syncytial Virus by PCR NEGATIVE NEGATIVE Final    Comment: (NOTE) Fact Sheet for Patients: BloggerCourse.com  Fact Sheet for Healthcare Providers: SeriousBroker.it  This test is not yet approved or cleared by the [place] FDA and has been authorized for detection and/or diagnosis of SARS-CoV-2 by FDA under an Emergency Use Authorization (EUA). This EUA will remain in effect (meaning this test can be used) for the duration of the COVID-19 declaration under Section 564(b)(1) of the Act, 21 U.S.C. section 360bbb-3(b)(1), unless the authorization is terminated or revoked.  Performed at [facility], [address], [place], [place] [address]   MRSA Next Gen by PCR, Nasal     Status: None   Collection Time: 02/01/23  9:50  PM   Specimen: Nasal Mucosa; Nasal Swab  Result Value Ref Range Status   MRSA by PCR Next Gen NOT DETECTED NOT DETECTED Final    Comment: (NOTE) The GeneXpert MRSA Assay (FDA approved for NASAL specimens only), is one component of a comprehensive MRSA colonization surveillance program. It is not intended to diagnose MRSA infection nor to guide or monitor treatment for MRSA infections. Test performance is not FDA approved in patients less than 66 years old. Performed at [facility], [address], [place], [place] [address]          Radiology Studies: No results found.      Scheduled Meds:  budesonide (PULMICORT) nebulizer solution  0.25 mg Nebulization BID   enoxaparin (LOVENOX) injection  40 mg Subcutaneous QHS   hydrochlorothiazide  25 mg Oral q morning   ipratropium-albuterol  3 mL Nebulization QID   methylPREDNISolone (SOLU-MEDROL) injection  60 mg Intravenous Q12H   pantoprazole  40 mg Oral BID AC   Continuous Infusions:  azithromycin Stopped (  02/05/23 2240)     LOS: 5 days    Time spent: 35 minutes    Clete Kuch D Sherryll BurgerShah, DO [facility]  If 7PM-7AM, please contact night-coverage [email or web address] 02/06/2023, 1:14 PM

## 2023-02-06 NOTE — Care Management Important Message (Signed)
Important Message  Patient Details  Name: Miguel Marsh MRN: [id number] Date of Birth: 01-29-1961   Medicare Important Message Given:  Yes     [name] 02/06/2023, 11:12 AM

## 2023-02-06 NOTE — Progress Notes (Signed)
Patient continues to do welll off of BIPAP and remains on 3lpm [place]. BIPAP/CPAP is on stand by.

## 2023-02-06 NOTE — Plan of Care (Signed)
  Problem: Education: Goal: Knowledge of General Education information will improve Description Including pain rating scale, medication(s)/side effects and non-pharmacologic comfort measures Outcome: Progressing   

## 2023-02-06 NOTE — Progress Notes (Signed)
Mobility Specialist Progress Note:    02/06/23 0932  Mobility  Activity Ambulated with assistance in hallway  Level of Assistance Standby assist, set-up cues, supervision of patient - no hands on  Assistive Device Front wheel walker  Distance Ambulated (ft) 160 ft  Activity Response Tolerated well  Mobility Referral Yes  $Mobility charge 1 Mobility   Pt agreeable to mobility session. Tolerated well, c/o dizziness throughout, SpO2 88% on 3L. Returned pt to EOB, all needs met.  Feliciana Rossetti Mobility Specialist Please contact via Special educational needs teacher or  Rehab office at [phone]

## 2023-02-06 NOTE — Progress Notes (Signed)
NAME:  Miguel Marsh, MRN:  [id number], DOB:  [birth year], LOS: 5 ADMISSION DATE:  02/01/2023, CONSULTATION DATE:  02/05/23  REFERRING MD:  Sherryll BurgerShah, CHIEF COMPLAINT:  resp distress   History of Present Illness:  [age] yobm with h/o ? 3B lung ca quit smoking 2 weeks PTA with very severe copd at baseline = last PFT 2019 GOLD IV with relatively preserved DLCO per [facility] where has had all his prev f/u and maint on breztri / 3lpm 24/7 with doe x 50 ft gradually worse since 1 month PTA assoc with worsening cough and less response to saba in hfa and neb forms with finding of AMS on admit while on opioids/clonazepam fith findings of severe hypercarbic resp failure on initial eval and PCCM consulted am 02/05/23 prior to d/c / rx for AECOPD back ? Near baseline ?   Pertinent  Medical History  62 y.o. male with medical history significant for COPD with chronic respiratory failure on 3 L, hypertension, right lung cancer. Patient was brought to the ED via EMS with complaints of difficulty breathing over the past 3 days.  He also reports a dry cough with generalized bodyaches.  No leg swelling.  No chest pain. Per EMS O2 sats were 85% on 3 L.     ED Course: Temperature 98.1.  Heart rate 77-104, respiratory rate 13-32, blood pressure systolic 89 to 150s, O2 sats initially ranging from 86 to 98%, on nasal cannula and nonrebreather, he was subsequently switched to BiPAP with VBG results showing pH of 7.1, pCO2 was not calculated.  Subsequent ABG shows pH of 7.26, pCO2 of greater than 123, after 2 hours on BiPAP, repeat ABG showed improved pH to 7.3, pCO2 of 123. Chest x-ray negative for acute abnormality. LR 500 mL given, 3.3 mg epinephrine, 2 g magnesium, continuous albuterol neb given. Patient was initially drowsy/somnolent, later improved with BiPAP. Hospitalist to admit for acute on chronic hypoxic and hypercapnic respiratory failure.  Significant Hospital Events: Including procedures, antibiotic start and stop dates in  addition to other pertinent events     Scheduled Meds:  budesonide (PULMICORT) nebulizer solution  0.25 mg Nebulization BID   enoxaparin (LOVENOX) injection  40 mg Subcutaneous QHS   hydrochlorothiazide  25 mg Oral q morning   ipratropium-albuterol  3 mL Nebulization QID   methylPREDNISolone (SOLU-MEDROL) injection  60 mg Intravenous Q12H   pantoprazole  40 mg Oral BID AC   Continuous Infusions:  azithromycin Stopped (02/05/23 2240)   PRN Meds:.acetaminophen **OR** acetaminophen, albuterol, clonazePAM, ondansetron **OR** ondansetron (ZOFRAN) IV, oxyCODONE, polyethylene glycol    Interim History / Subjective:  Walked in hallway to nurses station and back per his report, sob with freq stops but no desats on 3lpm   Objective   Blood pressure 99/79, pulse 97, temperature 99.5 F (37.5 C), temperature source Oral, resp. rate 20, height 5\' 6"  (1.676 m), weight 72.6 kg, SpO2 100 %.        Intake/Output Summary (Last 24 hours) at 02/06/2023 1256 Last data filed at 02/06/2023 0900 Gross per 24 hour  Intake 1239.86 ml  Output 250 ml  Net 989.86 ml   Filed Weights   02/01/23 1335 02/01/23 2214 02/02/23 0434  Weight: 81.2 kg 72.6 kg 72.6 kg    Examination:  Tmax:  99.9 trending up  General appearance:    chronically ill mod anxious bm nad   At Rest 02 sats  100% on 3lpm   No jvd Oropharynx clear,  mucosa nl Neck supple  Lungs with distant bs, min rhonchi bilaterally  RRR no s3 or or sign murmur Abd soft/ po intake good   Extr warm with no edema or clubbing noted Neuro  Sensorium intact ,  no apparent motor deficits      Resolved Hospital Problem list     Assessment & Plan:  1)  GOLD 4/ Group E copd complicated by profound but well compensated hypercarbic and hypoxemic resp failure with nl TSH  >>>  Group D (now reclassified as E) in terms of symptom/risk and laba/lama/ICS  therefore appropriate rx at this point >>>  breztri 2 bid best choice plus approp saba:  - 02/01/2023   After extensive coaching inhaler device,  effectiveness =    75% (short Ti) so rec  Plan A = Automatic = Always=    breztri Take 2 puffs first thing in am and then another 2 puffs about 12 hours later and hold off NIV/ bipap for now  Plan B = Backup (to supplement plan A, not to replace it) Only use your albuterol inhaler as a rescue medication to be used if you can't catch your breath by resting or doing a relaxed purse lip breathing pattern.  - The less you use it, the better it will work when you need it. - Ok to use the inhaler up to 2 puffs  every 4 hours if you must but call for appointment if use goes up over your usual need - Don't leave home without it !!  (think of it like the spare tire for your car)  Plan C = Crisis (instead of Plan B but only if Plan B stops working) - only use your albuterol nebulizer if you first try Plan B and it fails to help > ok to use the nebulizer up to every 4 hours but if start needing it regularly call for immediate appointment   2) Former smoker/ h/o 3B lung ca per care everywhere with all prior care at [facility] which is equidistant from his home so rec that he re-establish at [facility] for both oncology and pulmonary medicine  f/u and see me in interim if can't get re-established in next 30 days   3) Freq throat clearing typical of Upper airway cough syndrome (previously labeled PNDS),   ?related to gerd or dpi use or both  >>> d/c'd incruse, added max PPI for short term    Ordered today: cxr pa and lateral / ppi bid and consolidated nebs / d/c'd incruse  Labs   CBC: Recent Labs  Lab 02/01/23 1350 02/02/23 0625 02/03/23 0513 02/04/23 0447 02/05/23 0406  WBC 6.0 5.8 6.0 4.9 5.4  HGB 12.6* 10.7* 10.7* 11.4* 11.5*  HCT 44.1 37.3* 36.1* 37.9* 38.2*  MCV 106.3* 104.5* 102.3* 100.0 99.2  PLT 173 162 201 209 223    Basic Metabolic Panel: Recent Labs  Lab 02/01/23 1350 02/02/23 0503 02/03/23 0513 02/04/23 0447 02/05/23 0406  NA 138 140 139 137 136  K  4.1 4.0 4.3 4.1 3.9  CL 84* 88* 89* 86* 86*  CO2 >45* 41* 44* 45* 45*  GLUCOSE 130* 77 114* 117* 225*  BUN 7* 8 10 13 12   CREATININE 0.57* 0.43* 0.54* 0.45* 0.63  CALCIUM 8.4* 8.6* 8.4* 8.6* 8.5*  MG  --   --  1.9 1.8 1.7   GFR: Estimated Creatinine Clearance: 87.5 mL/min (by C-G formula based on SCr of 0.63 mg/dL). Recent Labs  Lab 02/02/23 [phone] 02/03/23 3903 02/04/23 0447 02/05/23 0406  WBC 5.8 6.0 4.9 5.4    Liver Function Tests: No results for input(s): "AST", "ALT", "ALKPHOS", "BILITOT", "PROT", "ALBUMIN" in the last 168 hours. No results for input(s): "LIPASE", "AMYLASE" in the last 168 hours. No results for input(s): "AMMONIA" in the last 168 hours.  ABG    Component Value Date/Time   PHART 7.44 02/02/2023 0845   PCO2ART 76 (HH) 02/02/2023 0845   PO2ART 45 (L) 02/02/2023 0845   HCO3 62.5 (H) 02/05/2023 0745   TCO2 42 (H) 01/23/2020 0519   O2SAT 88.1 02/05/2023 0745     Coagulation Profile: No results for input(s): "INR", "PROTIME" in the last 168 hours.  Cardiac Enzymes: No results for input(s): "CKTOTAL", "CKMB", "CKMBINDEX", "TROPONINI" in the last 168 hours.  HbA1C: Hgb A1c MFr Bld  Date/Time Value Ref Range Status  06/09/2022 06:15 AM 5.2 4.8 - 5.6 % Final    Comment:    (NOTE) Pre diabetes:          5.7%-6.4%  Diabetes:              >6.4%  Glycemic control for   <7.0% adults with diabetes   05/12/2021 09:04 PM 6.0 (H) 4.8 - 5.6 % Final    Comment:    (NOTE) Pre diabetes:          5.7%-6.4%  Diabetes:              >6.4%  Glycemic control for   <7.0% adults with diabetes     CBG: Recent Labs  Lab 02/02/23 1950 02/03/23 0808 02/03/23 1951 02/05/23 1828 02/06/23 0608  GLUCAP 271* 84 119* 161* 163*      Sandrea Hughs, MD Pulmonary and Critical Care Medicine [facility] Cell [phone]   After 7:00 pm call Elink  [phone]

## 2023-02-07 DIAGNOSIS — J9621 Acute and chronic respiratory failure with hypoxia: Secondary | ICD-10-CM | POA: Diagnosis not present

## 2023-02-07 DIAGNOSIS — J9622 Acute and chronic respiratory failure with hypercapnia: Secondary | ICD-10-CM | POA: Diagnosis not present

## 2023-02-07 LAB — CBC
HCT: 38.1 % — ABNORMAL LOW (ref 39.0–52.0)
Hemoglobin: 11.6 g/dL — ABNORMAL LOW (ref 13.0–17.0)
MCH: 30.1 pg (ref 26.0–34.0)
MCHC: 30.4 g/dL (ref 30.0–36.0)
MCV: 99 fL (ref 80.0–100.0)
Platelets: 188 10*3/uL (ref 150–400)
RBC: 3.85 MIL/uL — ABNORMAL LOW (ref 4.22–5.81)
RDW: 12.7 % (ref 11.5–15.5)
WBC: 6.5 10*3/uL (ref 4.0–10.5)
nRBC: 0 % (ref 0.0–0.2)

## 2023-02-07 LAB — BLOOD GAS, VENOUS
Acid-Base Excess: 17.8 mmol/L — ABNORMAL HIGH (ref 0.0–2.0)
Bicarbonate: 47.3 mmol/L — ABNORMAL HIGH (ref 20.0–28.0)
Drawn by: 53361
O2 Saturation: 71.4 %
Patient temperature: 37.2
pCO2, Ven: 81 mmHg (ref 44–60)
pH, Ven: 7.38 (ref 7.25–7.43)
pO2, Ven: 42 mmHg (ref 32–45)

## 2023-02-07 LAB — GLUCOSE, CAPILLARY
Glucose-Capillary: 152 mg/dL — ABNORMAL HIGH (ref 70–99)
Glucose-Capillary: 133 mg/dL — ABNORMAL HIGH (ref 70–99)
Glucose-Capillary: 153 mg/dL — ABNORMAL HIGH (ref 70–99)

## 2023-02-07 LAB — BASIC METABOLIC PANEL
Anion gap: 5 (ref 5–15)
BUN: 14 mg/dL (ref 8–23)
CO2: 38 mmol/L — ABNORMAL HIGH (ref 22–32)
Calcium: 8.4 mg/dL — ABNORMAL LOW (ref 8.9–10.3)
Chloride: 93 mmol/L — ABNORMAL LOW (ref 98–111)
Creatinine, Ser: 0.49 mg/dL — ABNORMAL LOW (ref 0.61–1.24)
GFR, Estimated: >60 mL/min (ref >60)
Glucose, Bld: 169 mg/dL — ABNORMAL HIGH (ref 70–99)
Potassium: 3.5 mmol/L (ref 3.5–5.1)
Sodium: 136 mmol/L (ref 135–145)

## 2023-02-07 LAB — MAGNESIUM: Magnesium: 1.7 mg/dL (ref 1.7–2.4)

## 2023-02-07 MED ORDER — PREDNISONE 20 MG PO TABS
40.0000 mg | ORAL_TABLET | Freq: Every day | ORAL | Status: DC
  Administered 2023-02-08: 40 mg via ORAL
  Filled 2023-02-07: qty 2

## 2023-02-07 NOTE — Progress Notes (Signed)
PROGRESS NOTE    Miguel CoolerKeith B Spickler  ZOX:[id number] DOB: Mar 19, 1961 DOA: 02/01/2023 PCP: Rebekah Chesterfieldickey, [place] M, NP  Chief Complaint  Patient presents with   Shortness of Breath   Generalized Body Aches    Brief Narrative:   Miguel Marsh is Miguel Marsh 62 y.o. male with medical history significant for COPD with chronic respiratory failure on 3 L, hypertension, right lung cancer. Patient was brought to the ED via EMS with complaints of difficulty breathing over the past 3 days.  Patient was admitted with acute on chronic hypoxemic and hypercapnic respiratory failure and started on BiPAP.  He is also noted to have hypercapnic encephalopathy in the setting of COPD exacerbation.  He continues to have high oxygen requirement as well as elevated pCO2 levels.  Plan to continue monitoring with pulmonology evaluation ongoing.  Patient still does not appear stable for discharge and this was discussed with pulmonology with plans to recheck chest x-ray today.  Assessment & Plan:   Principal Problem:   Acute on chronic respiratory failure with hypoxia and hypercapnia Active Problems:   COPD exacerbation   Acute metabolic encephalopathy   Essential hypertension   Tobacco use disorder  Acute on chronic respiratory failure with hypoxia and hypercapnia Initial ABG showing pH of 7.2, pCO2 of 123, pO2 of 59.  BMP shows bicarb of greater than 45, baseline high 30s, suggesting chronic retention.  Initial ED lethargic/somnolent in the ED, this improved.  Likely due to COPD exacerbation. -Venous blood gas performed with elevated pCO2 levels.  If he remains somnolent, will plan to transfer to stepdown with BiPAP. -Continue scheduled breathing treatments and Solu-Medrol -Pulmonology evaluation ongoing plans for repeat chest x-ray today and PPI to twice daily dosing -TSH 0.6   Acute metabolic encephalopathy due to hypercarbic respiratory failure  Resolved, follow respiratory status   COPD exacerbation  Hypercarbic  Respiratory Failure With acute hypoxic and hypercapnic respiratory failure.  Presenting with diffuse expiratory wheezing, dry cough.  Has required intubation in the past for respiratory failure.  Chest x-ray clear.  COVID influenza RSV negative. -transition to prednisone tomorrow -IV azithromycin -DuoNebs as needed and scheduled -Mucolytics as needed -chronic hypercarbic resp failure, has normal pH -Mucolytic's -CBGs twice daily while on steroids   Essential hypertension Resume HCTZ  Former Smoker  Hx Stage IIIb lung cancer Pulm [facility] and pulm medicine follow up Follow with Dr. Sherene SiresWert in the interim      DVT prophylaxis: lovenox Code Status: full code Family Communication: none Disposition:   Status is: Inpatient Remains inpatient appropriate because: pending further improvement   Consultants:  pulm  Procedures:  none  Antimicrobials:  Anti-infectives (From admission, onward)    Start     Dose/Rate Route Frequency Ordered Stop   02/01/23 2100  azithromycin (ZITHROMAX) 500 mg in sodium chloride 0.9 % 250 mL IVPB  Status:  Discontinued        500 mg 250 mL/hr over 60 Minutes Intravenous Daily at bedtime 02/01/23 2054 02/07/23 1006       Subjective: Doesn't feel ready to d/c yet  Objective: Vitals:   02/07/23 0847 02/07/23 0850 02/07/23 1333 02/07/23 1345  BP:   131/70   Pulse:   71   Resp:      Temp:   98.4 F (36.9 C)   TempSrc:   Oral   SpO2: 94% 96% 98% 99%  Weight:      Height:        Intake/Output Summary (Last  24 hours) at 02/07/2023 1756 Last data filed at 02/07/2023 1300 Gross per 24 hour  Intake 480 ml  Output --  Net 480 ml   Filed Weights   02/01/23 1335 02/01/23 2214 02/02/23 0434  Weight: 81.2 kg 72.6 kg 72.6 kg    Examination:  General exam: Appears calm and comfortable, sitting up, eating Respiratory system: diminished Cardiovascular system: RRR Gastrointestinal system: Abdomen is  nondistended, soft and nontender.  Central nervous system: Alert and oriented. No focal neurological deficits. Extremities: no LEE  Data Reviewed: I have personally reviewed following labs and imaging studies  CBC: Recent Labs  Lab 02/02/23 0625 02/03/23 0513 02/04/23 0447 02/05/23 0406 02/07/23 0510  WBC 5.8 6.0 4.9 5.4 6.5  HGB 10.7* 10.7* 11.4* 11.5* 11.6*  HCT 37.3* 36.1* 37.9* 38.2* 38.1*  MCV 104.5* 102.3* 100.0 99.2 99.0  PLT 162 201 209 223 188    Basic Metabolic Panel: Recent Labs  Lab 02/02/23 0503 02/03/23 0513 02/04/23 0447 02/05/23 0406 02/07/23 0510  NA 140 139 137 136 136  K 4.0 4.3 4.1 3.9 3.5  CL 88* 89* 86* 86* 93*  CO2 41* 44* 45* 45* 38*  GLUCOSE 77 114* 117* 225* 169*  BUN 8 10 13 12 14   CREATININE 0.43* 0.54* 0.45* 0.63 0.49*  CALCIUM 8.6* 8.4* 8.6* 8.5* 8.4*  MG  --  1.9 1.8 1.7 1.7    GFR: Estimated Creatinine Clearance: 87.5 mL/min (Kriss Ishler) (by C-G formula based on SCr of 0.49 mg/dL (L)).  Liver Function Tests: No results for input(s): "AST", "ALT", "ALKPHOS", "BILITOT", "PROT", "ALBUMIN" in the last 168 hours.  CBG: Recent Labs  Lab 02/06/23 0608 02/06/23 1350 02/06/23 1733 02/07/23 0513 02/07/23 1556  GLUCAP 163* 157* 147* 152* 133*     Recent Results (from the past 240 hour(s))  Resp panel by RT-PCR (RSV, Flu Fredricka Kohrs&B, Covid) Anterior Nasal Swab     Status: None   Collection Time: 02/01/23  2:14 PM   Specimen: Anterior Nasal Swab  Result Value Ref Range Status   SARS Coronavirus 2 by RT PCR NEGATIVE NEGATIVE Final    Comment: (NOTE) SARS-CoV-2 target nucleic acids are NOT DETECTED.  The SARS-CoV-2 RNA is generally detectable in upper respiratory specimens during the acute phase of infection. The lowest concentration of SARS-CoV-2 viral copies this assay can detect is 138 copies/mL. Lyzette Reinhardt negative result does not preclude SARS-Cov-2 infection and should not be used as the sole basis for treatment or other patient management  decisions. Eather Chaires negative result may occur with  improper specimen collection/handling, submission of specimen other than nasopharyngeal swab, presence of viral mutation(s) within the areas targeted by this assay, and inadequate number of viral copies(<138 copies/mL). Timmy Cleverly negative result must be combined with clinical observations, patient history, and epidemiological information. The expected result is Negative.  Fact Sheet for Patients:  BloggerCourse.com  Fact Sheet for Healthcare Providers:  SeriousBroker.it  This test is no t yet approved or cleared by the [place] FDA and  has been authorized for detection and/or diagnosis of SARS-CoV-2 by FDA under an Emergency Use Authorization (EUA). This EUA will remain  in effect (meaning this test can be used) for the duration of the COVID-19 declaration under Section 564(b)(1) of the Act, 21 U.S.C.section 360bbb-3(b)(1), unless the authorization is terminated  or revoked sooner.       Influenza Riggin Cuttino by PCR NEGATIVE NEGATIVE Final   Influenza B by PCR NEGATIVE NEGATIVE Final    Comment: (NOTE) The Xpert Xpress SARS-CoV-2/FLU/RSV plus  assay is intended as an aid in the diagnosis of influenza from Nasopharyngeal swab specimens and should not be used as Giordano Getman sole basis for treatment. Nasal washings and aspirates are unacceptable for Xpert Xpress SARS-CoV-2/FLU/RSV testing.  Fact Sheet for Patients: BloggerCourse.com  Fact Sheet for Healthcare Providers: SeriousBroker.it  This test is not yet approved or cleared by the [place] FDA and has been authorized for detection and/or diagnosis of SARS-CoV-2 by FDA under an Emergency Use Authorization (EUA). This EUA will remain in effect (meaning this test can be used) for the duration of the COVID-19 declaration under Section 564(b)(1) of the Act, 21 U.S.C. section 360bbb-3(b)(1), unless the  authorization is terminated or revoked.     Resp Syncytial Virus by PCR NEGATIVE NEGATIVE Final    Comment: (NOTE) Fact Sheet for Patients: BloggerCourse.com  Fact Sheet for Healthcare Providers: SeriousBroker.it  This test is not yet approved or cleared by the [place] FDA and has been authorized for detection and/or diagnosis of SARS-CoV-2 by FDA under an Emergency Use Authorization (EUA). This EUA will remain in effect (meaning this test can be used) for the duration of the COVID-19 declaration under Section 564(b)(1) of the Act, 21 U.S.C. section 360bbb-3(b)(1), unless the authorization is terminated or revoked.  Performed at [facility], [address], [place], [place] [address]   MRSA Next Gen by PCR, Nasal     Status: None   Collection Time: 02/01/23  9:50 PM   Specimen: Nasal Mucosa; Nasal Swab  Result Value Ref Range Status   MRSA by PCR Next Gen NOT DETECTED NOT DETECTED Final    Comment: (NOTE) The GeneXpert MRSA Assay (FDA approved for NASAL specimens only), is one component of Chin Wachter comprehensive MRSA colonization surveillance program. It is not intended to diagnose MRSA infection nor to guide or monitor treatment for MRSA infections. Test performance is not FDA approved in patients less than 25 years old. Performed at [facility], [address], [place], [place] [address]          Radiology Studies: DG Chest 2 View  Result Date: 02/06/2023 CLINICAL DATA:  COPD with acute exacerbation EXAM: CHEST - 2 VIEW COMPARISON:  Chest x-ray 02/01/2023. FINDINGS: There is stable blunting of the costophrenic angles favored as scarring. There is no pleural effusion, focal lung infiltrate or pneumothorax. The cardiomediastinal silhouette is within normal limits. No acute fractures are seen. IMPRESSION: No active cardiopulmonary disease. Electronically Signed   By: Darliss Cheney M.D.   On: 02/06/2023 15:11         Scheduled Meds:  budesonide (PULMICORT) nebulizer solution  0.25 mg Nebulization BID   enoxaparin (LOVENOX) injection  40 mg Subcutaneous QHS   hydrochlorothiazide  25 mg Oral q morning   ipratropium-albuterol  3 mL Nebulization QID   methylPREDNISolone (SOLU-MEDROL) injection  60 mg Intravenous Q12H   pantoprazole  40 mg Oral BID AC   Continuous Infusions:   LOS: 6 days    Time spent: over 30 min    Lacretia Nicks, MD [facility]   To contact the attending provider between 7A-7P or the covering provider during after hours 7P-7A, please log into the web site [email or web address] and access using universal [place] password for that web site. If you do not have the password, please call the hospital operator.  02/07/2023, 5:56 PM

## 2023-02-07 NOTE — Progress Notes (Signed)
   02/07/23 1831  Vitals  Temp 97.8 F (36.6 C)  Temp Source Oral  BP 138/69  MAP (mmHg) 89  BP Location Right Arm  BP Method Automatic  Patient Position (if appropriate) Lying  Pulse Rate 88  Pulse Rate Source Dinamap  ECG Heart Rate 85  Resp 16  Level of Consciousness  Level of Consciousness Alert  MEWS COLOR  MEWS Score Color Green  Orthostatic Lying   BP- Lying 138/69  Pulse- Lying 89  Orthostatic Sitting  BP- Sitting 130/72  Pulse- Sitting 83  Orthostatic Standing at 0 minutes  BP- Standing at 0 minutes 125/72  Pulse- Standing at 0 minutes 83  Orthostatic Standing at 3 minutes  BP- Standing at 3 minutes 131/72  Pulse- Standing at 3 minutes 83  Oxygen Therapy  SpO2 97 %  MEWS Score  MEWS Temp 0  MEWS Systolic 0  MEWS Pulse 0  MEWS RR 0  MEWS LOC 0  MEWS Score 0     Pt complains of dizziness and seeing spots. Explained that room got hot all of a sudden and he broke out into a sweat. CBG obtained it is 153 and orthostatic vitals taken. MD made aware.

## 2023-02-07 NOTE — Progress Notes (Signed)
Mobility Specialist Progress Note:    02/07/23 0945  Mobility  Activity Ambulated with assistance in hallway  Level of Assistance Contact guard assist, steadying assist  Assistive Device Front wheel walker  Distance Ambulated (ft) 180 ft  Activity Response Tolerated well  Mobility Referral Yes  $Mobility charge 1 Mobility   Pt agreeable to mobility session, received ambulating out of bathroom. Required CGA with RW for safety. Tolerated session well, had audible SOB throughout. Limited by knee weakness and SOB. SpO2 91% on 3L during ambulation. Returned pt to room, pt agreeable to sitting up in chair, all needs met.   Feliciana Rossetti Mobility Specialist Please contact via Special educational needs teacher or  Rehab office at [phone]

## 2023-02-08 DIAGNOSIS — J9621 Acute and chronic respiratory failure with hypoxia: Secondary | ICD-10-CM | POA: Diagnosis not present

## 2023-02-08 DIAGNOSIS — J9622 Acute and chronic respiratory failure with hypercapnia: Secondary | ICD-10-CM | POA: Diagnosis not present

## 2023-02-08 LAB — BASIC METABOLIC PANEL
Anion gap: 3 — ABNORMAL LOW (ref 5–15)
BUN: 13 mg/dL (ref 8–23)
CO2: 40 mmol/L — ABNORMAL HIGH (ref 22–32)
Calcium: 8.1 mg/dL — ABNORMAL LOW (ref 8.9–10.3)
Chloride: 93 mmol/L — ABNORMAL LOW (ref 98–111)
Creatinine, Ser: 0.52 mg/dL — ABNORMAL LOW (ref 0.61–1.24)
GFR, Estimated: >60 mL/min (ref >60)
Glucose, Bld: 79 mg/dL (ref 70–99)
Potassium: 3.5 mmol/L (ref 3.5–5.1)
Sodium: 136 mmol/L (ref 135–145)

## 2023-02-08 LAB — PHOSPHORUS: Phosphorus: 4.9 mg/dL — ABNORMAL HIGH (ref 2.5–4.6)

## 2023-02-08 LAB — MAGNESIUM: Magnesium: 1.8 mg/dL (ref 1.7–2.4)

## 2023-02-08 LAB — CBC
HCT: 35.9 % — ABNORMAL LOW (ref 39.0–52.0)
Hemoglobin: 10.9 g/dL — ABNORMAL LOW (ref 13.0–17.0)
MCH: 29.9 pg (ref 26.0–34.0)
MCHC: 30.4 g/dL (ref 30.0–36.0)
MCV: 98.4 fL (ref 80.0–100.0)
Platelets: 199 10*3/uL (ref 150–400)
RBC: 3.65 MIL/uL — ABNORMAL LOW (ref 4.22–5.81)
RDW: 13 % (ref 11.5–15.5)
WBC: 7.7 10*3/uL (ref 4.0–10.5)
nRBC: 0 % (ref 0.0–0.2)

## 2023-02-08 LAB — GLUCOSE, CAPILLARY
Glucose-Capillary: 103 mg/dL — ABNORMAL HIGH (ref 70–99)
Glucose-Capillary: 188 mg/dL — ABNORMAL HIGH (ref 70–99)

## 2023-02-08 MED ORDER — IPRATROPIUM-ALBUTEROL 0.5-2.5 (3) MG/3ML IN SOLN
3.0000 mL | Freq: Four times a day (QID) | RESPIRATORY_TRACT | Status: DC
  Administered 2023-02-08: 3 mL via RESPIRATORY_TRACT
  Filled 2023-02-08: qty 3

## 2023-02-08 MED ORDER — PREDNISONE 10 MG PO TABS: ORAL_TABLET | ORAL | 0 refills | Status: AC

## 2023-02-08 NOTE — H&P (Signed)
Patient requires frequent re-positioning of the body in ways that cannot be achieved with an ordinary bed or wedge pillow, to eliminate pain, reduce pressure, and the head of the bed to be elevated more than 30 degrees most of the time due to non small cell lung cancer and COPD.

## 2023-02-08 NOTE — Progress Notes (Signed)
SATURATION QUALIFICATIONS: (This note is used to comply with regulatory documentation for home oxygen)  Patient Saturations on Room Air at Rest = 90%  Patient Saturations on Room Air while Ambulating = 85%  Patient Saturations on 3 Liters of oxygen while Ambulating = 98%  Please briefly explain why patient needs home oxygen:

## 2023-02-08 NOTE — Discharge Summary (Signed)
Physician Discharge Summary  Miguel CoolerKeith B Hidrogo ZOX:[id number] DOB: 1961/10/09 DOA: 02/01/2023  PCP: Rebekah Chesterfieldickey, [place] M, NP  Admit date: 02/01/2023 Discharge date: 02/08/2023  Time spent: 40 minutes  Recommendations for Outpatient Follow-up:  Follow outpatient CBC/CMP  Follow with pulm outpatient - consider [facility] vs with Dr. Sherene SiresWert Episode of blurry vision needs outpatient follow up, chronic events going on for months, consider ophtho follow up as well as additional outpatient w/u with PCP    Discharge Diagnoses:  Principal Problem:   Acute on chronic respiratory failure with hypoxia and hypercapnia Active Problems:   COPD exacerbation   Acute metabolic encephalopathy   Essential hypertension   Tobacco use disorder   Discharge Condition: stable  Diet recommendation: heart healthy  Filed Weights   02/01/23 1335 02/01/23 2214 02/02/23 0434  Weight: 81.2 kg 72.6 kg 72.6 kg    History of present illness:   Miguel Marsh is Miguel Marsh 62 y.o. male with medical history significant for COPD with chronic respiratory failure on 3 L, hypertension, right lung cancer. Patient was brought to the ED via EMS with complaints of difficulty breathing over the past 3 days.  Patient was admitted with acute on chronic hypoxemic and hypercapnic respiratory failure and started on BiPAP.  He is also noted to have hypercapnic encephalopathy in the setting of COPD exacerbation.  He continues to have high oxygen requirement as well as elevated pCO2 levels.  Plan to continue monitoring with pulmonology evaluation ongoing.   He's improved with steroids and COPD management.  Seen by pulm.  Plan for outpatient follow up.  See below for additional details   Hospital Course:  Assessment and Plan:  Acute on chronic respiratory failure with hypoxia and hypercapnia Initial ABG showing pH of 7.2, pCO2 of 123, pO2 of 59.  BMP shows bicarb of greater than 45, baseline high 30s, suggesting chronic retention.  Initial  ED lethargic/somnolent in the ED, this improved.  Likely due to COPD exacerbation. -Venous blood gas performed with elevated pCO2 levels.  - resolved with treatment of COPD exacerbation below   Acute metabolic encephalopathy due to hypercarbic respiratory failure  Resolved, follow respiratory status   COPD exacerbation  Hypercarbic Respiratory Failure With acute hypoxic and hypercapnic respiratory failure.  Presenting with diffuse expiratory wheezing, dry cough.  Has required intubation in the past for respiratory failure.  Chest x-ray clear.  COVID influenza RSV negative. -prednisone taper at discharge -s/p azithromycin -DuoNebs as needed and scheduled -Mucolytics as needed -chronic hypercarbic resp failure, has normal pH -Mucolytic's -CBGs twice daily while on steroids   Essential hypertension Resume HCTZ   Former Smoker  Hx Stage IIIb lung cancer Pulm [facility] up Follow with Dr. Sherene SiresWert in the interim  Follow imaging outpatient   Blurry Vision Yesterday c/o blurry vision, seeing spots - symptoms resolved in minutes - vitals without hypotension, BG wnl Notes this has been going on intermittently for months Encouraged outpatient ophtho follow up, should also discuss with PCP to w/u further     Procedures: none   Consultations: pulmonology  Discharge Exam: Vitals:   02/08/23 1403 02/08/23 1441  BP:  132/77  Pulse:  84  Resp:    Temp:  98.6 F (37 C)  SpO2: 100% 99%   Feels ok No new complaints Notes episode of blurry vision has resolved, it's been intermittent over past several months  General: No acute distress. Cardiovascular: RRR Lungs: unlabored Neurological: Alert and oriented 3. Moves  all extremities 4 with equal strength. Cranial nerves II through XII grossly intact. Extremities: No clubbing or cyanosis. No edema.  Discharge Instructions   Discharge Instructions     Call MD for:   difficulty breathing, headache or visual disturbances   Complete by: As directed    Call MD for:  extreme fatigue   Complete by: As directed    Call MD for:  hives   Complete by: As directed    Call MD for:  persistant dizziness or light-headedness   Complete by: As directed    Call MD for:  persistant nausea and vomiting   Complete by: As directed    Call MD for:  redness, tenderness, or signs of infection (pain, swelling, redness, odor or green/yellow discharge around incision site)   Complete by: As directed    Call MD for:  severe uncontrolled pain   Complete by: As directed    Call MD for:  temperature >100.4   Complete by: As directed    Diet - low sodium heart healthy   Complete by: As directed    Discharge instructions   Complete by: As directed    You were seen for Torah Pinnock COPD exacerbation.  You've improved with inpatient care.  Continue to use your home 3 L oxygen.  Use your breztri aerosphere every day as prescribed.  Use your albuterol as needed for shortness of breath.  I'll send you home with Jacquelyn Shadrick steroid taper.  Dr. Sherene Sires recommends establishing with pulmonology and oncology at [facility] for your history of lung cancer and your COPD.  If you can't see them within Louisa Favaro month, call Dr. Sherene Sires for an appointment.  You had findings on your x ray that warrant outpatient follow up or repeat imaging.    Regarding the intermittent blurry vision you've had for months, you should establish with an eye doctor and discuss these symptoms with your PCP to work this up further.  Return for new, recurrent, or worsening symptoms.  Please ask your PCP to request records from this hospitalization so they know what was done and what the next steps will be.   Increase activity slowly   Complete by: As directed       Allergies as of 02/08/2023   No Known Allergies      Medication List     TAKE these medications    albuterol 108 (90 Base) MCG/ACT inhaler Commonly known as: VENTOLIN  HFA Inhale 2 puffs into the lungs every 6 (six) hours as needed for wheezing.   albuterol (2.5 MG/3ML) 0.083% nebulizer solution Commonly known as: PROVENTIL Inhale 2.5 mg into the lungs every 6 (six) hours as needed for wheezing or shortness of breath.   Breztri Aerosphere 160-9-4.8 MCG/ACT Aero Generic drug: Budeson-Glycopyrrol-Formoterol Inhale 2 puffs into the lungs 2 (two) times daily.   clonazePAM 1 MG tablet Commonly known as: KLONOPIN Take 1 mg by mouth 2 (two) times daily.   hydrochlorothiazide 25 MG tablet Commonly known as: HYDRODIURIL Take 25 mg by mouth every morning.   omeprazole 40 MG capsule Commonly known as: PRILOSEC Take 1 capsule by mouth daily.   oxyCODONE 15 MG immediate release tablet Commonly known as: ROXICODONE Take 15 mg by mouth 4 (four) times daily as needed for pain.   OXYGEN Inhale 3 L into the lungs continuous.   predniSONE 10 MG tablet Commonly known as: DELTASONE Take 4 tablets (40 mg total) by mouth daily with breakfast for 2 days, THEN 3 tablets (30 mg  total) daily with breakfast for 2 days, THEN 2 tablets (20 mg total) daily with breakfast for 2 days, THEN 1 tablet (10 mg total) daily with breakfast for 2 days. Start taking on: February 09, 2023       No Known Allergies  Follow-up Information     Nyoka Cowden, MD Follow up.   Specialty: Pulmonary Disease Contact information: [address] [place] [address] [phone]                  The results of significant diagnostics from this hospitalization (including imaging, microbiology, ancillary and laboratory) are listed below for reference.    Significant Diagnostic Studies: DG Chest 2 View  Result Date: 02/06/2023 CLINICAL DATA:  COPD with acute exacerbation EXAM: CHEST - 2 VIEW COMPARISON:  Chest x-ray 02/01/2023. FINDINGS: There is stable blunting of the costophrenic angles favored as scarring. There is no pleural effusion, focal lung infiltrate or  pneumothorax. The cardiomediastinal silhouette is within normal limits. No acute fractures are seen. IMPRESSION: No active cardiopulmonary disease. Electronically Signed   By: Darliss Cheney M.D.   On: 02/06/2023 15:11   DG Chest Port 1 View  Result Date: 02/01/2023 CLINICAL DATA:  Shortness of breath EXAM: PORTABLE CHEST 1 VIEW COMPARISON:  Radiograph 06/08/2022 FINDINGS: Unchanged cardiomediastinal silhouette. No focal airspace consolidation. Nodular opacity in the right lung base could correspond to Leevi Cullars nodule seen on prior chest CT in December 2021. No pleural effusion or evidence of pneumothorax. IMPRESSION: No evidence of acute cardiopulmonary disease. Nodular right basilar opacity could correspond to nodularity seen on prior chest CT in December 2021. Recommend correlation with any more recent chest CT, if available. Otherwise, Caleb Decock chest CT is recommended for direct comparison. Electronically Signed   By: Caprice Renshaw M.D.   On: 02/01/2023 14:14    Microbiology: Recent Results (from the past 240 hour(s))  Resp panel by RT-PCR (RSV, Flu Preslie Depasquale&B, Covid) Anterior Nasal Swab     Status: None   Collection Time: 02/01/23  2:14 PM   Specimen: Anterior Nasal Swab  Result Value Ref Range Status   SARS Coronavirus 2 by RT PCR NEGATIVE NEGATIVE Final    Comment: (NOTE) SARS-CoV-2 target nucleic acids are NOT DETECTED.  The SARS-CoV-2 RNA is generally detectable in upper respiratory specimens during the acute phase of infection. The lowest concentration of SARS-CoV-2 viral copies this assay can detect is 138 copies/mL. Venus Gilles negative result does not preclude SARS-Cov-2 infection and should not be used as the sole basis for treatment or other patient management decisions. Kaylyne Axton negative result may occur with  improper specimen collection/handling, submission of specimen other than nasopharyngeal swab, presence of viral mutation(s) within the areas targeted by this assay, and inadequate number of viral copies(<138  copies/mL). Claudett Bayly negative result must be combined with clinical observations, patient history, and epidemiological information. The expected result is Negative.  Fact Sheet for Patients:  BloggerCourse.com  Fact Sheet for Healthcare Providers:  SeriousBroker.it  This test is no t yet approved or cleared by the [place] FDA and  has been authorized for detection and/or diagnosis of SARS-CoV-2 by FDA under an Emergency Use Authorization (EUA). This EUA will remain  in effect (meaning this test can be used) for the duration of the COVID-19 declaration under Section 564(b)(1) of the Act, 21 U.S.C.section 360bbb-3(b)(1), unless the authorization is terminated  or revoked sooner.       Influenza Kalan Rinn by PCR NEGATIVE NEGATIVE Final   Influenza B  by PCR NEGATIVE NEGATIVE Final    Comment: (NOTE) The Xpert Xpress SARS-CoV-2/FLU/RSV plus assay is intended as an aid in the diagnosis of influenza from Nasopharyngeal swab specimens and should not be used as Loucille Takach sole basis for treatment. Nasal washings and aspirates are unacceptable for Xpert Xpress SARS-CoV-2/FLU/RSV testing.  Fact Sheet for Patients: BloggerCourse.com  Fact Sheet for Healthcare Providers: SeriousBroker.it  This test is not yet approved or cleared by the [place] FDA and has been authorized for detection and/or diagnosis of SARS-CoV-2 by FDA under an Emergency Use Authorization (EUA). This EUA will remain in effect (meaning this test can be used) for the duration of the COVID-19 declaration under Section 564(b)(1) of the Act, 21 U.S.C. section 360bbb-3(b)(1), unless the authorization is terminated or revoked.     Resp Syncytial Virus by PCR NEGATIVE NEGATIVE Final    Comment: (NOTE) Fact Sheet for Patients: BloggerCourse.com  Fact Sheet for Healthcare  Providers: SeriousBroker.it  This test is not yet approved or cleared by the [place] FDA and has been authorized for detection and/or diagnosis of SARS-CoV-2 by FDA under an Emergency Use Authorization (EUA). This EUA will remain in effect (meaning this test can be used) for the duration of the COVID-19 declaration under Section 564(b)(1) of the Act, 21 U.S.C. section 360bbb-3(b)(1), unless the authorization is terminated or revoked.  Performed at [facility], [address] [place], [place] [address]   MRSA Next Gen by PCR, Nasal     Status: None   Collection Time: 02/01/23  9:50 PM   Specimen: Nasal Mucosa; Nasal Swab  Result Value Ref Range Status   MRSA by PCR Next Gen NOT DETECTED NOT DETECTED Final    Comment: (NOTE) The GeneXpert MRSA Assay (FDA approved for NASAL specimens only), is one component of [place] comprehensive MRSA colonization surveillance program. It is not intended to diagnose MRSA infection nor to guide or monitor treatment for MRSA infections. Test performance is not FDA approved in patients less than 65 years old. Performed at [facility], [address], [place], [place] [address]      Labs: Basic Metabolic Panel: Recent Labs  Lab 02/03/23 0513 02/04/23 0447 02/05/23 0406 02/07/23 0510 02/08/23 0816  NA 139 137 136 136 136  K 4.3 4.1 3.9 3.5 3.5  CL 89* 86* 86* 93* 93*  CO2 44* 45* 45* 38* 40*  GLUCOSE 114* 117* 225* 169* 79  BUN 10 13 12 14 13   CREATININE 0.54* 0.45* 0.63 0.49* 0.52*  CALCIUM 8.4* 8.6* 8.5* 8.4* 8.1*  MG 1.9 1.8 1.7 1.7 1.8  PHOS  --   --   --   --  4.9*   Liver Function Tests: No results for input(s): "AST", "ALT", "ALKPHOS", "BILITOT", "PROT", "ALBUMIN" in the last 168 hours. No results for input(s): "LIPASE", "AMYLASE" in the last 168 hours. No results for input(s): "AMMONIA" in the last 168 hours. CBC: Recent Labs  Lab 02/03/23 0513 02/04/23 0447 02/05/23 0406 02/07/23 0510  02/08/23 0816  WBC 6.0 4.9 5.4 6.5 7.7  HGB 10.7* 11.4* 11.5* 11.6* 10.9*  HCT 36.1* 37.9* 38.2* 38.1* 35.9*  MCV 102.3* 100.0 99.2 99.0 98.4  PLT 201 209 223 188 199   Cardiac Enzymes: No results for input(s): "CKTOTAL", "CKMB", "CKMBINDEX", "TROPONINI" in the last 168 hours. BNP: BNP (last 3 results) No results for input(s): "BNP" in the last 8760 hours.  ProBNP (last 3 results) No results for input(s): "PROBNP" in the last 8760 hours.  CBG: Recent Labs  Lab 02/06/23 1733 02/07/23 0513 02/07/23 1556 02/07/23 1856 02/08/23 0340  GLUCAP 147* 152* 133* 153* 103*       Signed:  Lacretia Nicks MD.  [facility] 02/08/2023, 2:52 PM

## 2023-02-08 NOTE — TOC Transition Note (Signed)
Transition of Care [facility]) - CM/SW Discharge Note   Patient Details  Name: Miguel Marsh MRN: [id number] Date of Birth: 01-11-1961  [facility] CM/SW Contact:  Villa Herb, LCSWA Phone Number: 02/08/2023, 3:18 PM   Clinical Narrative:    CSW updated by RN that pt is requesting a hospital bed. CSW spoke with pt and this is what he would like. CSW spoke to [place] with [employer] who states they can work on referral and get the bed delivered today. There is no ETA on arrival of hospital bed to pts home. CSW confirmed with pt that he can arrive home prior to bed arriving. CSW updated RN. Pt will need EMS due to O2 need. TOC signing off.   Final next level of care: Home/Self Care Barriers to Discharge: Barriers Resolved   Patient Goals and CMS Choice CMS Medicare.gov Compare Post Acute Care list provided to:: Patient Choice offered to / list presented to : Patient  Discharge Placement                         Discharge Plan and Services Additional resources added to the After Visit Summary for                  DME Arranged: Hospital bed DME Agency: [employer] Date DME Agency Contacted: 02/08/23   Representative spoke with at DME Agency: Belenda Cruise            Social Determinants of Health (SDOH) Interventions SDOH Screenings   Tobacco Use: Medium Risk (02/01/2023)     Readmission Risk Interventions     No data to display

## 2023-02-08 NOTE — Progress Notes (Signed)
Nsg Discharge Note  Admit Date:  02/01/2023 Discharge date: 02/08/2023   Dorthy Cooler to be D/C'd Home per MD order.  AVS completed.   Patient/caregiver able to verbalize understanding.  Discharge Medication: Allergies as of 02/08/2023   No Known Allergies      Medication List     TAKE these medications    albuterol 108 (90 Base) MCG/ACT inhaler Commonly known as: VENTOLIN HFA Inhale 2 puffs into the lungs every 6 (six) hours as needed for wheezing.   albuterol (2.5 MG/3ML) 0.083% nebulizer solution Commonly known as: PROVENTIL Inhale 2.5 mg into the lungs every 6 (six) hours as needed for wheezing or shortness of breath.   Breztri Aerosphere 160-9-4.8 MCG/ACT Aero Generic drug: Budeson-Glycopyrrol-Formoterol Inhale 2 puffs into the lungs 2 (two) times daily.   clonazePAM 1 MG tablet Commonly known as: KLONOPIN Take 1 mg by mouth 2 (two) times daily.   hydrochlorothiazide 25 MG tablet Commonly known as: HYDRODIURIL Take 25 mg by mouth every morning.   omeprazole 40 MG capsule Commonly known as: PRILOSEC Take 1 capsule by mouth daily.   oxyCODONE 15 MG immediate release tablet Commonly known as: ROXICODONE Take 15 mg by mouth 4 (four) times daily as needed for pain.   OXYGEN Inhale 3 L into the lungs continuous.   predniSONE 10 MG tablet Commonly known as: DELTASONE Take 4 tablets (40 mg total) by mouth daily with breakfast for 2 days, THEN 3 tablets (30 mg total) daily with breakfast for 2 days, THEN 2 tablets (20 mg total) daily with breakfast for 2 days, THEN 1 tablet (10 mg total) daily with breakfast for 2 days. Start taking on: February 09, 2023        Discharge Assessment: Vitals:   02/08/23 1403 02/08/23 1441  BP:  132/77  Pulse:  84  Resp:    Temp:  98.6 F (37 C)  SpO2: 100% 99%   Skin clean, dry and intact without evidence of skin break down, no evidence of skin tears noted. IV catheter discontinued intact. Site without signs and symptoms of  complications - no redness or edema noted at insertion site, patient denies c/o pain - only slight tenderness at site.  Dressing with slight pressure applied.  D/c Instructions-Education: Discharge instructions given to patient/family with verbalized understanding. D/c education completed with patient/family including follow up instructions, medication list, d/c activities limitations if indicated, with other d/c instructions as indicated by MD - patient able to verbalize understanding, all questions fully answered. Patient instructed to return to ED, call 911, or call MD for any changes in condition.  Patient escorted via WC, and D/C home via private auto.  Laurena Spies, RN 02/08/2023 3:04 PM

## 2023-02-13 ENCOUNTER — Emergency Department (HOSPITAL_COMMUNITY)
Admission: EM | Admit: 2023-02-13 | Discharge: 2023-02-14 | Disposition: A | Discharge status: Home / Self Care | Payer: 59 | Attending: Emergency Medicine | Admitting: Emergency Medicine

## 2023-02-13 ENCOUNTER — Other Ambulatory Visit: Payer: Self-pay

## 2023-02-13 ENCOUNTER — Emergency Department (HOSPITAL_COMMUNITY): Payer: 59

## 2023-02-13 ENCOUNTER — Encounter (HOSPITAL_COMMUNITY): Payer: Self-pay | Admitting: Emergency Medicine

## 2023-02-13 DIAGNOSIS — J449 Chronic obstructive pulmonary disease, unspecified: Secondary | ICD-10-CM | POA: Insufficient documentation

## 2023-02-13 DIAGNOSIS — J189 Pneumonia, unspecified organism: Secondary | ICD-10-CM

## 2023-02-13 DIAGNOSIS — R600 Localized edema: Secondary | ICD-10-CM | POA: Insufficient documentation

## 2023-02-13 DIAGNOSIS — J9612 Chronic respiratory failure with hypercapnia: Secondary | ICD-10-CM | POA: Insufficient documentation

## 2023-02-13 DIAGNOSIS — Z7951 Long term (current) use of inhaled steroids: Secondary | ICD-10-CM | POA: Diagnosis not present

## 2023-02-13 DIAGNOSIS — D649 Anemia, unspecified: Secondary | ICD-10-CM | POA: Diagnosis not present

## 2023-02-13 DIAGNOSIS — I10 Essential (primary) hypertension: Secondary | ICD-10-CM | POA: Diagnosis not present

## 2023-02-13 DIAGNOSIS — J181 Lobar pneumonia, unspecified organism: Secondary | ICD-10-CM | POA: Diagnosis not present

## 2023-02-13 DIAGNOSIS — Z79899 Other long term (current) drug therapy: Secondary | ICD-10-CM | POA: Insufficient documentation

## 2023-02-13 DIAGNOSIS — J9611 Chronic respiratory failure with hypoxia: Secondary | ICD-10-CM | POA: Insufficient documentation

## 2023-02-13 DIAGNOSIS — R059 Cough, unspecified: Secondary | ICD-10-CM | POA: Diagnosis present

## 2023-02-13 DIAGNOSIS — D539 Nutritional anemia, unspecified: Secondary | ICD-10-CM

## 2023-02-13 LAB — CBC
HCT: 41 % (ref 39.0–52.0)
Hemoglobin: 12.1 g/dL — ABNORMAL LOW (ref 13.0–17.0)
MCH: 30.5 pg (ref 26.0–34.0)
MCHC: 29.5 g/dL — ABNORMAL LOW (ref 30.0–36.0)
MCV: 103.3 fL — ABNORMAL HIGH (ref 80.0–100.0)
Platelets: 175 10*3/uL (ref 150–400)
RBC: 3.97 MIL/uL — ABNORMAL LOW (ref 4.22–5.81)
RDW: 12.3 % (ref 11.5–15.5)
WBC: 6.9 10*3/uL (ref 4.0–10.5)
nRBC: 0 % (ref 0.0–0.2)

## 2023-02-13 MED ORDER — ALBUTEROL SULFATE HFA 108 (90 BASE) MCG/ACT IN AERS
2.0000 | INHALATION_SPRAY | RESPIRATORY_TRACT | Status: DC | PRN
  Administered 2023-02-14: 2 via RESPIRATORY_TRACT
  Filled 2023-02-13: qty 6.7

## 2023-02-13 NOTE — ED Triage Notes (Signed)
Pt c/o lower leg swelling for a couple of hours. Pt is on continuous o2 at home.

## 2023-02-14 DIAGNOSIS — J181 Lobar pneumonia, unspecified organism: Secondary | ICD-10-CM | POA: Diagnosis not present

## 2023-02-14 LAB — BASIC METABOLIC PANEL
BUN: 11 mg/dL (ref 8–23)
CO2: >45 mmol/L — ABNORMAL HIGH (ref 22–32)
Calcium: 8.7 mg/dL — ABNORMAL LOW (ref 8.9–10.3)
Chloride: 83 mmol/L — ABNORMAL LOW (ref 98–111)
Creatinine, Ser: 0.5 mg/dL — ABNORMAL LOW (ref 0.61–1.24)
GFR, Estimated: >60 mL/min (ref >60)
Glucose, Bld: 84 mg/dL (ref 70–99)
Potassium: 3.9 mmol/L (ref 3.5–5.1)
Sodium: 137 mmol/L (ref 135–145)

## 2023-02-14 LAB — BRAIN NATRIURETIC PEPTIDE: B Natriuretic Peptide: 41 pg/mL (ref 0.0–100.0)

## 2023-02-14 MED ORDER — CEFDINIR 300 MG PO CAPS
300.0000 mg | ORAL_CAPSULE | Freq: Two times a day (BID) | ORAL | Status: DC
  Administered 2023-02-14: 300 mg via ORAL
  Filled 2023-02-14: qty 1

## 2023-02-14 MED ORDER — CEFDINIR 300 MG PO CAPS: 300.0000 mg | ORAL_CAPSULE | Freq: Two times a day (BID) | ORAL | 0 refills | Status: DC

## 2023-02-14 MED ORDER — FUROSEMIDE 10 MG/ML IJ SOLN
40.0000 mg | Freq: Once | INTRAMUSCULAR | Status: AC
  Administered 2023-02-14: 40 mg via INTRAVENOUS

## 2023-02-14 MED ORDER — FUROSEMIDE 10 MG/ML IJ SOLN
40.0000 mg | Freq: Once | INTRAMUSCULAR | Status: DC
  Filled 2023-02-14: qty 4

## 2023-02-14 MED ORDER — FUROSEMIDE 40 MG PO TABS: 40.0000 mg | ORAL_TABLET | Freq: Every day | ORAL | 0 refills | Status: DC

## 2023-02-14 NOTE — ED Provider Notes (Signed)
[place] EMERGENCY DEPARTMENT AT [facility] Provider Note   CSN: [id number] Arrival date & time: 02/13/23  2235     History  Chief Complaint  Patient presents with   Leg Swelling    Miguel Marsh is a 62 y.o. male.  The history is provided by the patient.  He has history of hypertension, oxygen dependent COPD and comes in with about a 2-day history of cough which is nonproductive, and leg swelling which started today.  He has not noticed any change in his baseline dyspnea.  He denies fever, chills, sweats.  He denies any chest pain, heaviness, tightness, pressure.   Home Medications Prior to Admission medications   Medication Sig Start Date End Date Taking? Authorizing Provider  cefdinir (OMNICEF) 300 MG capsule Take 1 capsule (300 mg total) by mouth 2 (two) times daily. 02/14/23  Yes Dione Booze, MD  furosemide (LASIX) 40 MG tablet Take 1 tablet (40 mg total) by mouth daily. 02/14/23  Yes Dione Booze, MD  albuterol (PROVENTIL HFA;VENTOLIN HFA) 108 (90 Base) MCG/ACT inhaler Inhale 2 puffs into the lungs every 6 (six) hours as needed for wheezing. 07/17/18   Shon Hale, MD  albuterol (PROVENTIL) (2.5 MG/3ML) 0.083% nebulizer solution Inhale 2.5 mg into the lungs every 6 (six) hours as needed for wheezing or shortness of breath.  01/19/20   [provider]  BREZTRI AEROSPHERE 160-9-4.8 MCG/ACT AERO Inhale 2 puffs into the lungs 2 (two) times daily. 05/17/22   [provider]  clonazePAM (KLONOPIN) 1 MG tablet Take 1 mg by mouth 2 (two) times daily. 05/18/22   [provider]  hydrochlorothiazide (HYDRODIURIL) 25 MG tablet Take 25 mg by mouth every morning. 07/02/20   [provider]  omeprazole (PRILOSEC) 40 MG capsule Take 1 capsule by mouth daily. 11/06/16   [provider]  oxyCODONE (ROXICODONE) 15 MG immediate release tablet Take 15 mg by mouth 4 (four) times daily as needed for pain.  08/13/20   [provider]   OXYGEN Inhale 3 L into the lungs continuous.     [provider]  predniSONE (DELTASONE) 10 MG tablet Take 4 tablets (40 mg total) by mouth daily with breakfast for 2 days, THEN 3 tablets (30 mg total) daily with breakfast for 2 days, THEN 2 tablets (20 mg total) daily with breakfast for 2 days, THEN 1 tablet (10 mg total) daily with breakfast for 2 days. 02/09/23 02/17/23  Zigmund Daniel., MD      Allergies    Patient has no known allergies.    Review of Systems   Review of Systems  All other systems reviewed and are negative.   Physical Exam Updated Vital Signs BP (!) 163/89   Pulse 97   Temp 98.1 F (36.7 C) (Oral)   Resp (!) 22   Ht 5\' 6"  (1.676 m)   Wt 73 kg   SpO2 97%   BMI 25.98 kg/m  Physical Exam Vitals and nursing note reviewed.   62 year old male, resting comfortably and in no acute distress. Vital signs are significant for elevated blood pressure and slightly elevated respiratory rate I I did I did not state if he had needed IV we can change it to IV I. Oxygen saturation is 97%, which is normal. Head is normocephalic and atraumatic. PERRLA, EOMI. Oropharynx is clear. Neck is nontender and supple without adenopathy or JVD. Back is nontender and there is no CVA tenderness. Lungs have bibasilar rales without  wheezes or rhonchi. Chest is nontender. Heart has regular rate and rhythm without murmur. Abdomen is soft, flat, nontender. Extremities have 2+ pretibial and pedal edema. Skin is warm and dry without rash. Neurologic: Mental status is normal, cranial nerves are intact, moves all extremities equally.  ED Results / Procedures / Treatments   Labs (all labs ordered are listed, but only abnormal results are displayed) Labs Reviewed  BASIC METABOLIC PANEL - Abnormal; Notable for the following components:      Result Value   Chloride 83 (*)    CO2 >45 (*)    Creatinine, Ser 0.50 (*)    Calcium 8.7 (*)    All other components within normal limits   CBC - Abnormal; Notable for the following components:   RBC 3.97 (*)    Hemoglobin 12.1 (*)    MCV 103.3 (*)    MCHC 29.5 (*)    All other components within normal limits  BRAIN NATRIURETIC PEPTIDE    EKG EKG Interpretation  Date/Time:  Tuesday February 13 2023 22:44:36 EDT Ventricular Rate:  96 PR Interval:  144 QRS Duration: 89 QT Interval:  361 QTC Calculation: 457 R Axis:   49 Text Interpretation: Sinus rhythm Minimal ST elevation, anterior leads Baseline wander in lead(s) II III aVF V2 When compared with ECG of 02/01/2023, No significant change was found Reconfirmed by Dione Booze ([id number]) on 02/14/2023 1:55:26 AM  Radiology DG Chest 2 View  Result Date: 02/13/2023 CLINICAL DATA:  Shortness of breath EXAM: CHEST - 2 VIEW COMPARISON:  02/06/2023 FINDINGS: Patchy right lower lobe opacity, new, suspicious for pneumonia. Mild left basilar scarring/atelectasis. No pleural effusion or pneumothorax. Heart is normal in size. IMPRESSION: Patchy right lower lobe opacity, suspicious for pneumonia. Electronically Signed   By: Charline Bills M.D.   On: 02/13/2023 23:33    Procedures Procedures    Medications Ordered in ED Medications  albuterol (VENTOLIN HFA) 108 (90 Base) MCG/ACT inhaler 2 puff (has no administration in time range)  cefdinir (OMNICEF) capsule 300 mg (300 mg Oral Given 02/14/23 0149)  furosemide (LASIX) injection 40 mg (has no administration in time range)    ED Course/ Medical Decision Making/ A&P                             Medical Decision Making Amount and/or Complexity of Data Reviewed Labs: ordered. Radiology: ordered.  Risk Prescription drug management.   Peripheral edema, probably heart failure.  Cough in setting of COPD, rule out pneumonia.  I have reviewed and interpreted his electrocardiogram, and my interpretation is no acute ST or T changes, unchanged from prior.  Chest x-ray shows right lower lobe opacity suspicious for pneumonia, could also be  asymmetric heart failure.  Have independently viewed the images, and agree with the radiologist's interpretation.  I have reviewed and interpreted his laboratory test, and my interpretation is anemia which is improved compared with baseline, normal WBC, normal BNP, elevated CO2 consistent with known history of hypercarbic respiratory failure.  Patient is awake and talkative, no evidence of CO2 narcosis.  In spite o elevated BNP, I do believe his peripheral edema is heart failure.  On review of past records, I note echocardiogram on 03/06/2018 showing a normal ejection fraction but grade 1 diastolic dysfunction.  He was admitted to the hospital from 02/01/2023 through 02/08/2023 with COPD exacerbation requiring BiPAP.  Clinically, patient is doing well from a respiratory standpoint.  He is not using  accessory muscles of respiration, is doing well on his baseline oxygen, I do not see an indication for hospital admission at this point.  However, I definitely feel he would benefit from diuresis, also I feel that right lower lobe opacity will need to be treated as pneumonia.  I have ordered an initial dose of cefdinir and also a dose of furosemide.  I am discharging him with prescriptions for cefdinir and furosemide and recommending close follow-up with PCP in 5 days to decide if he needs to continue on diuretics.  Strict return precautions were discussed.  Final Clinical Impression(s) / ED Diagnoses Final diagnoses:  Peripheral edema  Community acquired pneumonia of right lower lobe of lung  Macrocytic anemia  Chronic respiratory failure with hypoxia and hypercapnia    Rx / DC Orders ED Discharge Orders          Ordered    furosemide (LASIX) 40 MG tablet  Daily        02/14/23 0146    cefdinir (OMNICEF) 300 MG capsule  2 times daily        02/14/23 0146              Dione Booze, MD 02/14/23 0159

## 2023-02-14 NOTE — Discharge Instructions (Signed)
Your x-ray is concerning for pneumonia, I am discharging you with a prescription for an antibiotic.  I am also giving you a prescription for medication to get rid of extra fluid.  It is very important that you see your primary care provider in 5 days to decide whether you need to continue on medication to get rid of extra fluid.  In the meantime, if it anytime your breathing is getting worse or you have any other concerning symptoms, return to the emergency department.

## 2023-02-14 NOTE — ED Notes (Signed)
Notified [place] [facility] of patient needing transportation back home.  

## 2023-02-14 NOTE — ED Notes (Signed)
Patient's aide called and advised they would come pick the patient up.

## 2023-03-05 ENCOUNTER — Inpatient Hospital Stay (HOSPITAL_COMMUNITY)
Admission: EM | Admit: 2023-03-05 | Discharge: 2023-03-09 | DRG: 190 | Disposition: A | Discharge status: Home / Self Care | Payer: 59 | Attending: Internal Medicine | Admitting: Internal Medicine

## 2023-03-05 ENCOUNTER — Other Ambulatory Visit: Payer: Self-pay

## 2023-03-05 ENCOUNTER — Emergency Department (HOSPITAL_COMMUNITY): Payer: 59

## 2023-03-05 DIAGNOSIS — Z720 Tobacco use: Secondary | ICD-10-CM

## 2023-03-05 DIAGNOSIS — J441 Chronic obstructive pulmonary disease with (acute) exacerbation: Principal | ICD-10-CM | POA: Diagnosis present

## 2023-03-05 DIAGNOSIS — F1410 Cocaine abuse, uncomplicated: Secondary | ICD-10-CM | POA: Diagnosis present

## 2023-03-05 DIAGNOSIS — Z1152 Encounter for screening for COVID-19: Secondary | ICD-10-CM

## 2023-03-05 DIAGNOSIS — Z79899 Other long term (current) drug therapy: Secondary | ICD-10-CM

## 2023-03-05 DIAGNOSIS — K219 Gastro-esophageal reflux disease without esophagitis: Secondary | ICD-10-CM | POA: Diagnosis present

## 2023-03-05 DIAGNOSIS — Z66 Do not resuscitate: Secondary | ICD-10-CM | POA: Diagnosis present

## 2023-03-05 DIAGNOSIS — Z85118 Personal history of other malignant neoplasm of bronchus and lung: Secondary | ICD-10-CM

## 2023-03-05 DIAGNOSIS — Z716 Tobacco abuse counseling: Secondary | ICD-10-CM

## 2023-03-05 DIAGNOSIS — Z9981 Dependence on supplemental oxygen: Secondary | ICD-10-CM

## 2023-03-05 DIAGNOSIS — R718 Other abnormality of red blood cells: Secondary | ICD-10-CM | POA: Insufficient documentation

## 2023-03-05 DIAGNOSIS — Z7151 Drug abuse counseling and surveillance of drug abuser: Secondary | ICD-10-CM

## 2023-03-05 DIAGNOSIS — J9622 Acute and chronic respiratory failure with hypercapnia: Secondary | ICD-10-CM | POA: Diagnosis present

## 2023-03-05 DIAGNOSIS — I10 Essential (primary) hypertension: Secondary | ICD-10-CM | POA: Diagnosis present

## 2023-03-05 DIAGNOSIS — M1990 Unspecified osteoarthritis, unspecified site: Secondary | ICD-10-CM | POA: Diagnosis present

## 2023-03-05 DIAGNOSIS — R7989 Other specified abnormal findings of blood chemistry: Secondary | ICD-10-CM | POA: Insufficient documentation

## 2023-03-05 DIAGNOSIS — J479 Bronchiectasis, uncomplicated: Secondary | ICD-10-CM | POA: Diagnosis present

## 2023-03-05 DIAGNOSIS — J9621 Acute and chronic respiratory failure with hypoxia: Secondary | ICD-10-CM | POA: Diagnosis present

## 2023-03-05 DIAGNOSIS — Z96649 Presence of unspecified artificial hip joint: Secondary | ICD-10-CM | POA: Diagnosis present

## 2023-03-05 LAB — CBC WITH DIFFERENTIAL/PLATELET
Abs Immature Granulocytes: 0.01 10*3/uL (ref 0.00–0.07)
Basophils Absolute: 0 10*3/uL (ref 0.0–0.1)
Basophils Relative: 0 %
Eosinophils Absolute: 0 10*3/uL (ref 0.0–0.5)
Eosinophils Relative: 0 %
HCT: 43.1 % (ref 39.0–52.0)
Hemoglobin: 12.1 g/dL — ABNORMAL LOW (ref 13.0–17.0)
Immature Granulocytes: 0 %
Lymphocytes Relative: 11 %
Lymphs Abs: 0.5 10*3/uL — ABNORMAL LOW (ref 0.7–4.0)
MCH: 30.2 pg (ref 26.0–34.0)
MCHC: 28.1 g/dL — ABNORMAL LOW (ref 30.0–36.0)
MCV: 107.5 fL — ABNORMAL HIGH (ref 80.0–100.0)
Monocytes Absolute: 0.7 10*3/uL (ref 0.1–1.0)
Monocytes Relative: 14 %
Neutro Abs: 3.9 10*3/uL (ref 1.7–7.7)
Neutrophils Relative %: 75 %
Platelets: 252 10*3/uL (ref 150–400)
RBC: 4.01 MIL/uL — ABNORMAL LOW (ref 4.22–5.81)
RDW: 12.4 % (ref 11.5–15.5)
WBC: 5.2 10*3/uL (ref 4.0–10.5)
nRBC: 0 % (ref 0.0–0.2)

## 2023-03-05 LAB — CULTURE, BLOOD (ROUTINE X 2)

## 2023-03-05 LAB — RESP PANEL BY RT-PCR (RSV, FLU A&B, COVID)  RVPGX2
Influenza A by PCR: NEGATIVE
Influenza B by PCR: NEGATIVE
Resp Syncytial Virus by PCR: NEGATIVE
SARS Coronavirus 2 by RT PCR: NEGATIVE

## 2023-03-05 MED ORDER — SODIUM CHLORIDE 0.9 % IV SOLN
500.0000 mg | INTRAVENOUS | Status: DC
  Administered 2023-03-06: 500 mg via INTRAVENOUS
  Filled 2023-03-05 (×2): qty 5

## 2023-03-05 MED ORDER — SODIUM CHLORIDE 0.9 % IV SOLN
2.0000 g | INTRAVENOUS | Status: DC
  Administered 2023-03-05: 2 g via INTRAVENOUS
  Filled 2023-03-05: qty 20

## 2023-03-05 MED ORDER — ALBUTEROL SULFATE (2.5 MG/3ML) 0.083% IN NEBU
10.0000 mg/h | INHALATION_SOLUTION | Freq: Once | RESPIRATORY_TRACT | Status: AC
  Administered 2023-03-05: 10 mg/h via RESPIRATORY_TRACT
  Filled 2023-03-05: qty 3

## 2023-03-05 NOTE — ED Notes (Signed)
Ems gave pt a breathing tx and 125mg  of solumedrol.

## 2023-03-05 NOTE — ED Provider Notes (Signed)
[place] EMERGENCY DEPARTMENT AT [facility] Provider Note   CSN: [id number] Arrival date & time: 03/05/23  2211     History {Add pertinent medical, surgical, social history, OB history to HPI:1} Chief Complaint  Patient presents with  . Shortness of Breath    Miguel Marsh is a 62 y.o. male.   Shortness of Breath Patient presents for shortness of breath.  Medical history includes COPD, HTN, lung cancer in remission, GERD, arthritis.     Home Medications Prior to Admission medications   Medication Sig Start Date End Date Taking? Authorizing Provider  albuterol (PROVENTIL HFA;VENTOLIN HFA) 108 (90 Base) MCG/ACT inhaler Inhale 2 puffs into the lungs every 6 (six) hours as needed for wheezing. 07/17/18   Shon Hale, MD  albuterol (PROVENTIL) (2.5 MG/3ML) 0.083% nebulizer solution Inhale 2.5 mg into the lungs every 6 (six) hours as needed for wheezing or shortness of breath.  01/19/20   [provider]  BREZTRI AEROSPHERE 160-9-4.8 MCG/ACT AERO Inhale 2 puffs into the lungs 2 (two) times daily. 05/17/22   [provider]  cefdinir (OMNICEF) 300 MG capsule Take 1 capsule (300 mg total) by mouth 2 (two) times daily. 02/14/23   Dione Booze, MD  clonazePAM (KLONOPIN) 1 MG tablet Take 1 mg by mouth 2 (two) times daily. 05/18/22   [provider]  furosemide (LASIX) 40 MG tablet Take 1 tablet (40 mg total) by mouth daily. 02/14/23   Dione Booze, MD  hydrochlorothiazide (HYDRODIURIL) 25 MG tablet Take 25 mg by mouth every morning. 07/02/20   [provider]  omeprazole (PRILOSEC) 40 MG capsule Take 1 capsule by mouth daily. 11/06/16   [provider]  oxyCODONE (ROXICODONE) 15 MG immediate release tablet Take 15 mg by mouth 4 (four) times daily as needed for pain.  08/13/20   [provider]  OXYGEN Inhale 3 L into the lungs continuous.     [provider]      Allergies    Patient has no known allergies.     Review of Systems   Review of Systems  Respiratory:  Positive for shortness of breath.     Physical Exam Updated Vital Signs Ht 5\' 6"  (1.676 m)   Wt 73 kg   BMI 25.98 kg/m  Physical Exam  ED Results / Procedures / Treatments   Labs (all labs ordered are listed, but only abnormal results are displayed) Labs Reviewed - No data to display  EKG None  Radiology No results found.  Procedures Procedures  {Document cardiac monitor, telemetry assessment procedure when appropriate:1}  Medications Ordered in ED Medications - No data to display  ED Course/ Medical Decision Making/ A&P   {   Click here for ABCD2, HEART and other calculatorsREFRESH Note before signing :1}                          Medical Decision Making  ***  {Document critical care time when appropriate:1} {Document review of labs and clinical decision tools ie heart score, Chads2Vasc2 etc:1}  {Document your independent review of radiology images, and any outside records:1} {Document your discussion with family members, caretakers, and with consultants:1} {Document social determinants of health affecting pt's care:1} {Document your decision making why or why not admission, treatments were needed:1} Final Clinical Impression(s) / ED Diagnoses Final diagnoses:  None    Rx / DC Orders ED Discharge Orders     None

## 2023-03-05 NOTE — ED Triage Notes (Signed)
Pt called ems for sob.

## 2023-03-05 NOTE — Progress Notes (Signed)
Patient is currently on a CAT and 6 lpm HFNC;saturations 98%.  BIPAP is on hold at this time

## 2023-03-06 ENCOUNTER — Observation Stay (HOSPITAL_COMMUNITY): Payer: 59

## 2023-03-06 DIAGNOSIS — F1410 Cocaine abuse, uncomplicated: Secondary | ICD-10-CM | POA: Diagnosis present

## 2023-03-06 DIAGNOSIS — Z96649 Presence of unspecified artificial hip joint: Secondary | ICD-10-CM | POA: Diagnosis present

## 2023-03-06 DIAGNOSIS — Z716 Tobacco abuse counseling: Secondary | ICD-10-CM | POA: Diagnosis not present

## 2023-03-06 DIAGNOSIS — Z1152 Encounter for screening for COVID-19: Secondary | ICD-10-CM | POA: Diagnosis not present

## 2023-03-06 DIAGNOSIS — I10 Essential (primary) hypertension: Secondary | ICD-10-CM | POA: Diagnosis present

## 2023-03-06 DIAGNOSIS — J9622 Acute and chronic respiratory failure with hypercapnia: Secondary | ICD-10-CM

## 2023-03-06 DIAGNOSIS — K219 Gastro-esophageal reflux disease without esophagitis: Secondary | ICD-10-CM

## 2023-03-06 DIAGNOSIS — J9621 Acute and chronic respiratory failure with hypoxia: Secondary | ICD-10-CM | POA: Diagnosis present

## 2023-03-06 DIAGNOSIS — R718 Other abnormality of red blood cells: Secondary | ICD-10-CM | POA: Diagnosis not present

## 2023-03-06 DIAGNOSIS — Z66 Do not resuscitate: Secondary | ICD-10-CM | POA: Diagnosis present

## 2023-03-06 DIAGNOSIS — M1990 Unspecified osteoarthritis, unspecified site: Secondary | ICD-10-CM | POA: Diagnosis present

## 2023-03-06 DIAGNOSIS — Z85118 Personal history of other malignant neoplasm of bronchus and lung: Secondary | ICD-10-CM | POA: Diagnosis not present

## 2023-03-06 DIAGNOSIS — J441 Chronic obstructive pulmonary disease with (acute) exacerbation: Secondary | ICD-10-CM | POA: Diagnosis present

## 2023-03-06 DIAGNOSIS — Z720 Tobacco use: Secondary | ICD-10-CM | POA: Diagnosis not present

## 2023-03-06 DIAGNOSIS — Z79899 Other long term (current) drug therapy: Secondary | ICD-10-CM | POA: Diagnosis not present

## 2023-03-06 DIAGNOSIS — Z9981 Dependence on supplemental oxygen: Secondary | ICD-10-CM | POA: Diagnosis not present

## 2023-03-06 DIAGNOSIS — Z7151 Drug abuse counseling and surveillance of drug abuser: Secondary | ICD-10-CM | POA: Diagnosis not present

## 2023-03-06 DIAGNOSIS — I5031 Acute diastolic (congestive) heart failure: Secondary | ICD-10-CM

## 2023-03-06 DIAGNOSIS — R7989 Other specified abnormal findings of blood chemistry: Secondary | ICD-10-CM

## 2023-03-06 DIAGNOSIS — J479 Bronchiectasis, uncomplicated: Secondary | ICD-10-CM | POA: Diagnosis present

## 2023-03-06 LAB — URINALYSIS, ROUTINE W REFLEX MICROSCOPIC
Bilirubin Urine: NEGATIVE
Glucose, UA: 50 mg/dL — AB
Hgb urine dipstick: NEGATIVE
Ketones, ur: NEGATIVE mg/dL
Leukocytes,Ua: NEGATIVE
Nitrite: NEGATIVE
Protein, ur: 100 mg/dL — AB
Specific Gravity, Urine: 1.023 (ref 1.005–1.030)
pH: 5 (ref 5.0–8.0)

## 2023-03-06 LAB — COMPREHENSIVE METABOLIC PANEL
ALT: 14 U/L (ref 0–44)
AST: 14 U/L — ABNORMAL LOW (ref 15–41)
Albumin: 3.5 g/dL (ref 3.5–5.0)
Alkaline Phosphatase: 69 U/L (ref 38–126)
BUN: 9 mg/dL (ref 8–23)
CO2: >45 mmol/L — ABNORMAL HIGH (ref 22–32)
Calcium: 8.6 mg/dL — ABNORMAL LOW (ref 8.9–10.3)
Chloride: 88 mmol/L — ABNORMAL LOW (ref 98–111)
Creatinine, Ser: 0.49 mg/dL — ABNORMAL LOW (ref 0.61–1.24)
GFR, Estimated: >60 mL/min (ref >60)
Glucose, Bld: 188 mg/dL — ABNORMAL HIGH (ref 70–99)
Potassium: 4.2 mmol/L (ref 3.5–5.1)
Sodium: 141 mmol/L (ref 135–145)
Total Bilirubin: 0.3 mg/dL (ref 0.3–1.2)
Total Protein: 6.7 g/dL (ref 6.5–8.1)

## 2023-03-06 LAB — CBC
HCT: 43.3 % (ref 39.0–52.0)
Hemoglobin: 11.9 g/dL — ABNORMAL LOW (ref 13.0–17.0)
MCH: 30.1 pg (ref 26.0–34.0)
MCHC: 27.5 g/dL — ABNORMAL LOW (ref 30.0–36.0)
MCV: 109.6 fL — ABNORMAL HIGH (ref 80.0–100.0)
Platelets: 244 10*3/uL (ref 150–400)
RBC: 3.95 MIL/uL — ABNORMAL LOW (ref 4.22–5.81)
RDW: 12.3 % (ref 11.5–15.5)
WBC: 5.1 10*3/uL (ref 4.0–10.5)
nRBC: 0 % (ref 0.0–0.2)

## 2023-03-06 LAB — BLOOD GAS, VENOUS
Acid-Base Excess: 23.6 mmol/L — ABNORMAL HIGH (ref 0.0–2.0)
Bicarbonate: 57 mmol/L — ABNORMAL HIGH (ref 20.0–28.0)
Drawn by: 1528
Drawn by: 1528
O2 Saturation: 77.1 %
O2 Saturation: 98.5 %
Patient temperature: 37.4
pCO2, Ven: >123 mmHg (ref 44–60)
pH, Ven: 7.25 (ref 7.25–7.43)
pO2, Ven: 119 mmHg — ABNORMAL HIGH (ref 32–45)
pO2, Ven: 42 mmHg (ref 32–45)

## 2023-03-06 LAB — ECHOCARDIOGRAM COMPLETE
AR max vel: 3.12 cm2
AV Area VTI: 3.24 cm2
AV Area mean vel: 2.98 cm2
AV Mean grad: 4 mmHg
AV Peak grad: 9.7 mmHg
Ao pk vel: 1.56 m/s
Area-P 1/2: 4.71 cm2
Height: 66 in
S' Lateral: 2.7 cm
Weight: 2624.36 oz

## 2023-03-06 LAB — VITAMIN B12: Vitamin B-12: 315 pg/mL (ref 180–914)

## 2023-03-06 LAB — TROPONIN I (HIGH SENSITIVITY)
Troponin I (High Sensitivity): 12 ng/L (ref <18)
Troponin I (High Sensitivity): 14 ng/L (ref <18)

## 2023-03-06 LAB — PHOSPHORUS: Phosphorus: 4.1 mg/dL (ref 2.5–4.6)

## 2023-03-06 LAB — MRSA NEXT GEN BY PCR, NASAL: MRSA by PCR Next Gen: NOT DETECTED

## 2023-03-06 LAB — MAGNESIUM: Magnesium: 2.1 mg/dL (ref 1.7–2.4)

## 2023-03-06 LAB — FOLATE: Folate: 6.3 ng/mL (ref >5.9)

## 2023-03-06 LAB — BRAIN NATRIURETIC PEPTIDE: B Natriuretic Peptide: 183 pg/mL — ABNORMAL HIGH (ref 0.0–100.0)

## 2023-03-06 LAB — CULTURE, BLOOD (ROUTINE X 2): Culture: NO GROWTH

## 2023-03-06 MED ORDER — ACETAMINOPHEN 325 MG PO TABS: 650.0000 mg | ORAL_TABLET | Freq: Four times a day (QID) | ORAL | Status: DC | PRN

## 2023-03-06 MED ORDER — FUROSEMIDE 40 MG PO TABS
40.0000 mg | ORAL_TABLET | Freq: Every day | ORAL | Status: DC
  Administered 2023-03-06 – 2023-03-09 (×3): 40 mg via ORAL
  Filled 2023-03-06 (×4): qty 1

## 2023-03-06 MED ORDER — ENOXAPARIN SODIUM 40 MG/0.4ML IJ SOSY
40.0000 mg | PREFILLED_SYRINGE | INTRAMUSCULAR | Status: DC
  Administered 2023-03-06 – 2023-03-09 (×4): 40 mg via SUBCUTANEOUS
  Filled 2023-03-06 (×4): qty 0.4

## 2023-03-06 MED ORDER — METHYLPREDNISOLONE SODIUM SUCC 40 MG IJ SOLR
40.0000 mg | Freq: Two times a day (BID) | INTRAMUSCULAR | Status: DC
  Administered 2023-03-06 – 2023-03-09 (×7): 40 mg via INTRAVENOUS
  Filled 2023-03-06 (×7): qty 1

## 2023-03-06 MED ORDER — CHLORHEXIDINE GLUCONATE CLOTH 2 % EX PADS
6.0000 | MEDICATED_PAD | Freq: Every day | CUTANEOUS | Status: DC
  Administered 2023-03-06 – 2023-03-07 (×2): 6 via TOPICAL

## 2023-03-06 MED ORDER — SODIUM CHLORIDE 0.9 % IV SOLN
500.0000 mg | INTRAVENOUS | Status: DC
  Administered 2023-03-06: 500 mg via INTRAVENOUS
  Filled 2023-03-06: qty 5

## 2023-03-06 MED ORDER — SODIUM CHLORIDE 0.9 % IV SOLN
2.0000 g | INTRAVENOUS | Status: DC
  Administered 2023-03-06: 2 g via INTRAVENOUS
  Filled 2023-03-06: qty 20

## 2023-03-06 MED ORDER — ONDANSETRON HCL 4 MG PO TABS: 4.0000 mg | ORAL_TABLET | Freq: Four times a day (QID) | ORAL | Status: DC | PRN

## 2023-03-06 MED ORDER — DM-GUAIFENESIN ER 30-600 MG PO TB12
1.0000 | ORAL_TABLET | Freq: Two times a day (BID) | ORAL | Status: DC
  Administered 2023-03-06 – 2023-03-09 (×6): 1 via ORAL
  Filled 2023-03-06 (×6): qty 1

## 2023-03-06 MED ORDER — IPRATROPIUM-ALBUTEROL 0.5-2.5 (3) MG/3ML IN SOLN: 3.0000 mL | RESPIRATORY_TRACT | Status: DC | PRN

## 2023-03-06 MED ORDER — ONDANSETRON HCL 4 MG/2ML IJ SOLN: 4.0000 mg | Freq: Four times a day (QID) | INTRAMUSCULAR | Status: DC | PRN

## 2023-03-06 MED ORDER — IPRATROPIUM-ALBUTEROL 0.5-2.5 (3) MG/3ML IN SOLN
3.0000 mL | Freq: Four times a day (QID) | RESPIRATORY_TRACT | Status: DC
  Administered 2023-03-06 – 2023-03-07 (×5): 3 mL via RESPIRATORY_TRACT
  Filled 2023-03-06 (×5): qty 3

## 2023-03-06 MED ORDER — PANTOPRAZOLE SODIUM 40 MG IV SOLR
40.0000 mg | Freq: Two times a day (BID) | INTRAVENOUS | Status: DC
  Administered 2023-03-06 – 2023-03-07 (×3): 40 mg via INTRAVENOUS
  Filled 2023-03-06 (×3): qty 10

## 2023-03-06 MED ORDER — ORAL CARE MOUTH RINSE
15.0000 mL | OROMUCOSAL | Status: DC
  Administered 2023-03-06 – 2023-03-09 (×12): 15 mL via OROMUCOSAL

## 2023-03-06 MED ORDER — ALBUTEROL SULFATE (2.5 MG/3ML) 0.083% IN NEBU
10.0000 mg/h | INHALATION_SOLUTION | Freq: Once | RESPIRATORY_TRACT | Status: AC
  Administered 2023-03-06: 10 mg/h via RESPIRATORY_TRACT
  Filled 2023-03-06: qty 3

## 2023-03-06 MED ORDER — ORAL CARE MOUTH RINSE: 15.0000 mL | OROMUCOSAL | Status: DC | PRN

## 2023-03-06 MED ORDER — ACETAMINOPHEN 650 MG RE SUPP: 650.0000 mg | Freq: Four times a day (QID) | RECTAL | Status: DC | PRN

## 2023-03-06 NOTE — Progress Notes (Signed)
Echocardiogram 2D Echocardiogram has been performed.  Maren Reamer 03/06/2023, 9:54 AM

## 2023-03-06 NOTE — Plan of Care (Signed)

## 2023-03-06 NOTE — TOC Progression Note (Signed)
  Transition of Care [facility]) Screening Note   Patient Details  Name: Miguel Marsh Date of Birth: [birth year]   Transition of Care [facility]) CM/SW Contact:    Elliot Gault, LCSW Phone Number: 03/06/2023, 10:55 AM    Transition of Care Department [facility]) has reviewed patient and no TOC needs have been identified at this time. We will continue to monitor patient advancement through interdisciplinary progression rounds. If new patient transition needs arise, please place a TOC consult.

## 2023-03-06 NOTE — Progress Notes (Signed)
PROGRESS NOTE     Miguel Marsh, is a 62 y.o. male, DOB - 10-23-1961, WUJ:[id number]  Admit date - 03/05/2023   Admitting Physician Carle Dargan Mariea Clonts, MD  Outpatient Primary MD for the patient is Miguel Chesterfield, NP  LOS - 0  Chief Complaint  Patient presents with   Shortness of Breath        Brief Narrative:   62 y.o. male with medical history significant of hypertension, COPD, chronic respiratory failure on 3 LPM of oxygen at baseline, right lung cancer admitted on 03/06/2023 with acute on chronic hypoxic respiratory failure secondary to COPD exacerbation    -Assessment and Plan: 1) acute COPD exacerbation--no definite pneumonia continue IV Solu-Medrol and, Rocephin, azithromycin, mucolytics,  bronchodilators  2)HFpEF-diastolic dysfunction CHF -Patient presenting with worsening hypoxia, elevated BNP and chest x-ray suggestive of pulmonary venous congestion -Echo from 03/06/2023 with EF of 60 to 65% -Prior echo from 2019 was consistent with EF of 65 to 60% and grade 1 diastolic dysfunction -Stop HCTZ -Give Lasix  3)GERD--continue Protonix especially while on steroids  4) social/ethics--- patient admits to very frequent cocaine -Not interested in drug rehab program for -Is a DNR/DNI  5) acute on chronic hypoxic respiratory failure--due to #1 #2 above -At baseline patient uses 2 L of oxygen via nasal cannula -Currently requiring 5 L  Status is: Inpatient   Disposition: The patient is from: Home              Anticipated d/c is to: Home              Anticipated d/c date is: 2 days              Patient currently is not medically stable to d/c. Barriers: Not Clinically Stable-   Code Status :  -  Code Status: DNR   Family Communication:    NA (patient is alert, awake and coherent)   DVT Prophylaxis  :   - SCDs   enoxaparin (LOVENOX) injection 40 mg Start: 03/06/23 1000 SCDs Start: 03/06/23 0352   Lab Results  Component Value Date   PLT 244 03/06/2023     Inpatient Medications  Scheduled Meds:  Chlorhexidine Gluconate Cloth  6 each Topical Daily   dextromethorphan-guaiFENesin  1 tablet Oral BID   enoxaparin (LOVENOX) injection  40 mg Subcutaneous Q24H   ipratropium-albuterol  3 mL Nebulization Q6H   methylPREDNISolone (SOLU-MEDROL) injection  40 mg Intravenous Q12H   mouth rinse  15 mL Mouth Rinse 4 times per day   pantoprazole (PROTONIX) IV  40 mg Intravenous Q12H   Continuous Infusions:  [START ON 03/07/2023] azithromycin     cefTRIAXone (ROCEPHIN)  IV     PRN Meds:.acetaminophen **OR** acetaminophen, ipratropium-albuterol, ondansetron **OR** ondansetron (ZOFRAN) IV, mouth rinse   Anti-infectives (From admission, onward)    Start     Dose/Rate Route Frequency Ordered Stop   03/07/23 0000  azithromycin (ZITHROMAX) 500 mg in sodium chloride 0.9 % 250 mL IVPB        500 mg 250 mL/hr over 60 Minutes Intravenous Every 24 hours 03/06/23 0521 03/10/23 2359   03/06/23 2200  cefTRIAXone (ROCEPHIN) 2 g in sodium chloride 0.9 % 100 mL IVPB        2 g 200 mL/hr over 30 Minutes Intravenous Every 24 hours 03/06/23 0521 03/10/23 2159   03/05/23 2230  cefTRIAXone (ROCEPHIN) 2 g in sodium chloride 0.9 % 100 mL IVPB  Status:  Discontinued        2  g 200 mL/hr over 30 Minutes Intravenous Every 24 hours 03/05/23 2223 03/06/23 0520   03/05/23 2230  azithromycin (ZITHROMAX) 500 mg in sodium chloride 0.9 % 250 mL IVPB  Status:  Discontinued        500 mg 250 mL/hr over 60 Minutes Intravenous Every 24 hours 03/05/23 2223 03/06/23 0520       Subjective: Kyra Searles today has no fevers, no emesis,  No chest pain,   - Cough and wheezing persist -Hypoxia patient states   Objective: Vitals:   03/06/23 1702 03/06/23 1703 03/06/23 1800 03/06/23 1923  BP:  (!) 138/48 (!) 119/46   Pulse:      Resp: 20  14   Temp:      TempSrc:      SpO2:  96%  100%  Weight:      Height:        Intake/Output Summary (Last 24 hours) at 03/06/2023  1945 Last data filed at 03/06/2023 1712 Gross per 24 hour  Intake 354.27 ml  Output 575 ml  Net -220.73 ml   Filed Weights   03/05/23 2213 03/06/23 0519  Weight: 73 kg 74.4 kg    Physical Exam  Gen:- Awake Alert, some conversational dyspnea HEENT:- [place].AT, No sclera icterus Nose-[place] 5L/min alternating with BiPAP Neck-Supple Neck,No JVD,.  Lungs-diminished sounds with scattered wheezes bilaterally CV- S1, S2 normal, regular  Abd-  +ve B.Sounds, Abd Soft, No tenderness,    Extremity/Skin:- No  edema, pedal pulses present  Psych-affect is anxious at  times,, oriented x3 Neuro-no new focal deficits, no tremors  Data Reviewed: I have personally reviewed following labs and imaging studies  CBC: Recent Labs  Lab 03/05/23 2326 03/06/23 0528  WBC 5.2 5.1  NEUTROABS 3.9  --   HGB 12.1* 11.9*  HCT 43.1 43.3  MCV 107.5* 109.6*  PLT 252 244   Basic Metabolic Panel: Recent Labs  Lab 03/05/23 2326 03/06/23 0155  NA 141  --   K 4.2  --   CL 88*  --   CO2 >45*  --   GLUCOSE 188*  --   BUN 9  --   CREATININE 0.49*  --   CALCIUM 8.6*  --   MG 2.1  --   PHOS  --  4.1   GFR: Estimated Creatinine Clearance: 87.5 mL/min (A) (by C-G formula based on SCr of 0.49 mg/dL (L)). Liver Function Tests: Recent Labs  Lab 03/05/23 2326  AST 14*  ALT 14  ALKPHOS 69  BILITOT 0.3  PROT 6.7  ALBUMIN 3.5   Recent Results (from the past 240 hour(s))  Resp panel by RT-PCR (RSV, Flu A&B, Covid) Anterior Nasal Swab     Status: None   Collection Time: 03/05/23 10:40 PM   Specimen: Anterior Nasal Swab  Result Value Ref Range Status   SARS Coronavirus 2 by RT PCR NEGATIVE NEGATIVE Final    Comment: (NOTE) SARS-CoV-2 target nucleic acids are NOT DETECTED.  The SARS-CoV-2 RNA is generally detectable in upper respiratory specimens during the acute phase of infection. The lowest concentration of SARS-CoV-2 viral copies this assay can detect is 138 copies/mL. A negative result does not  preclude SARS-Cov-2 infection and should not be used as the sole basis for treatment or other patient management decisions. A negative result may occur with  improper specimen collection/handling, submission of specimen other than nasopharyngeal swab, presence of viral mutation(s) within the areas targeted by this assay, and inadequate number of viral copies(<138 copies/mL). A negative  result must be combined with clinical observations, patient history, and epidemiological information. The expected result is Negative.  Fact Sheet for Patients:  BloggerCourse.com  Fact Sheet for Healthcare Providers:  SeriousBroker.it  This test is no t yet approved or cleared by the [place] FDA and  has been authorized for detection and/or diagnosis of SARS-CoV-2 by FDA under an Emergency Use Authorization (EUA). This EUA will remain  in effect (meaning this test can be used) for the duration of the COVID-19 declaration under Section 564(b)(1) of the Act, 21 U.S.C.section 360bbb-3(b)(1), unless the authorization is terminated  or revoked sooner.       Influenza A by PCR NEGATIVE NEGATIVE Final   Influenza B by PCR NEGATIVE NEGATIVE Final    Comment: (NOTE) The Xpert Xpress SARS-CoV-2/FLU/RSV plus assay is intended as an aid in the diagnosis of influenza from Nasopharyngeal swab specimens and should not be used as a sole basis for treatment. Nasal washings and aspirates are unacceptable for Xpert Xpress SARS-CoV-2/FLU/RSV testing.  Fact Sheet for Patients: BloggerCourse.com  Fact Sheet for Healthcare Providers: SeriousBroker.it  This test is not yet approved or cleared by the [place] FDA and has been authorized for detection and/or diagnosis of SARS-CoV-2 by FDA under an Emergency Use Authorization (EUA). This EUA will remain in effect (meaning this test can be used) for the  duration of the COVID-19 declaration under Section 564(b)(1) of the Act, 21 U.S.C. section 360bbb-3(b)(1), unless the authorization is terminated or revoked.     Resp Syncytial Virus by PCR NEGATIVE NEGATIVE Final    Comment: (NOTE) Fact Sheet for Patients: BloggerCourse.com  Fact Sheet for Healthcare Providers: SeriousBroker.it  This test is not yet approved or cleared by the [place] FDA and has been authorized for detection and/or diagnosis of SARS-CoV-2 by FDA under an Emergency Use Authorization (EUA). This EUA will remain in effect (meaning this test can be used) for the duration of the COVID-19 declaration under Section 564(b)(1) of the Act, 21 U.S.C. section 360bbb-3(b)(1), unless the authorization is terminated or revoked.  Performed at [facility], [address], [place], [place] [address]   Blood culture (routine x 2)     Status: None (Preliminary result)   Collection Time: 03/05/23 10:52 PM   Specimen: BLOOD  Result Value Ref Range Status   Specimen Description BLOOD BLOOD RIGHT ARM  Final   Special Requests   Final    BOTTLES DRAWN AEROBIC AND ANAEROBIC Blood Culture adequate volume   Culture   Final    NO GROWTH < 12 HOURS Performed at [facility], [address], [place], [place] [address]    Report Status PENDING  Incomplete  Blood culture (routine x 2)     Status: None (Preliminary result)   Collection Time: 03/05/23 11:36 PM   Specimen: BLOOD  Result Value Ref Range Status   Specimen Description BLOOD BLOOD LEFT WRIST  Final   Special Requests   Final    BOTTLES DRAWN AEROBIC AND ANAEROBIC Blood Culture adequate volume   Culture   Final    NO GROWTH < 12 HOURS Performed at [facility], [address], [place], [place] [address]    Report Status PENDING  Incomplete  MRSA Next Gen by PCR, Nasal     Status: None   Collection Time: 03/06/23  8:30 AM   Specimen: Nasal Mucosa; Nasal Swab  Result  Value Ref Range Status   MRSA by PCR Next Gen NOT DETECTED NOT DETECTED Final  Comment: (NOTE) The GeneXpert MRSA Assay (FDA approved for NASAL specimens only), is one component of a comprehensive MRSA colonization surveillance program. It is not intended to diagnose MRSA infection nor to guide or monitor treatment for MRSA infections. Test performance is not FDA approved in patients less than 86 years old. Performed at [facility], [address], [place], [place] [address]       Radiology Studies: ECHOCARDIOGRAM COMPLETE  Result Date: 03/06/2023    ECHOCARDIOGRAM REPORT   Patient Name:   LYZANDER KOLM Date of Exam: 03/06/2023 Medical Rec #:  [id number]        Height:       66.0 in Accession #:    [id number]       Weight:       164.0 lb Date of Birth:  1961/07/18        BSA:          1.838 m Patient Age:    61 years         BP:           124/52 mmHg Patient Gender: M                HR:           85 bpm. [facility] Procedure: 2D Echo, Cardiac Doppler and Color Doppler Indications:    CHF-Acute Diastolic I50.31  History:        Patient has no prior history of Echocardiogram examinations.                 Signs/Symptoms:Altered Mental Status; Risk Factors:Hypertension                 and Former Smoker.  Sonographer:    Aron Baba Referring Phys: [id number] OLADAPO ADEFESO  Sonographer Comments: Image acquisition challenging due to uncooperative patient and Image acquisition challenging due to COPD. IMPRESSIONS  1. Left ventricular ejection fraction, by estimation, is 60 to 65%. The left ventricle has normal function. Left ventricular endocardial border not optimally defined to evaluate regional wall motion. Left ventricular diastolic parameters were normal.  2. Right ventricular systolic function is normal. The right ventricular size is normal.  3. The mitral valve is normal in structure. Trivial mitral valve regurgitation. No evidence of mitral stenosis.  4. The tricuspid valve is  abnormal.  5. The aortic valve was not well visualized. Aortic valve regurgitation is not visualized.  6. Technically difficult study due to altered mental status, noncooperation with study. FINDINGS  Left Ventricle: Left ventricular ejection fraction, by estimation, is 60 to 65%. The left ventricle has normal function. Left ventricular endocardial border not optimally defined to evaluate regional wall motion. The left ventricular internal cavity size was normal in size. There is no left ventricular hypertrophy. Left ventricular diastolic parameters were normal. Right Ventricle: The right ventricular size is normal. Right vetricular wall thickness was not well visualized. Right ventricular systolic function is normal. Left Atrium: Left atrial size was not well visualized. Right Atrium: Right atrial size was not well visualized. Pericardium: There is no evidence of pericardial effusion. Mitral Valve: The mitral valve is normal in structure. Trivial mitral valve regurgitation. No evidence of mitral valve stenosis. Tricuspid Valve: The tricuspid valve is abnormal. Tricuspid valve regurgitation is mild . No evidence of tricuspid stenosis. Aortic Valve: The aortic valve was not well visualized. Aortic valve regurgitation is not visualized. Aortic valve mean gradient measures 4.0 mmHg. Aortic valve peak gradient measures 9.7 mmHg. Aortic  valve area, by VTI measures 3.24 cm. Pulmonic Valve: The pulmonic valve was not well visualized. Pulmonic valve regurgitation is not visualized. No evidence of pulmonic stenosis. Aorta: The aortic root is normal in size and structure. IAS/Shunts: The interatrial septum was not well visualized.  LEFT VENTRICLE PLAX 2D LVIDd:         4.40 cm   Diastology LVIDs:         2.70 cm   LV e' medial:    10.10 cm/s LV PW:         1.10 cm   LV E/e' medial:  11.2 LV IVS:        1.00 cm   LV e' lateral:   15.50 cm/s LVOT diam:     2.10 cm   LV E/e' lateral: 7.3 LV SV:         77 LV SV Index:   42 LVOT  Area:     3.46 cm  RIGHT VENTRICLE RV S prime:     15.10 cm/s TAPSE (M-mode): 2.4 cm LEFT ATRIUM         Index LA diam:    3.00 cm 1.63 cm/m  AORTIC VALVE AV Area (Vmax):    3.12 cm AV Area (Vmean):   2.98 cm AV Area (VTI):     3.24 cm AV Vmax:           156.10 cm/s AV Vmean:          89.049 cm/s AV VTI:            0.238 m AV Peak Grad:      9.7 mmHg AV Mean Grad:      4.0 mmHg LVOT Vmax:         140.67 cm/s LVOT Vmean:        76.585 cm/s LVOT VTI:          0.222 m LVOT/AV VTI ratio: 0.94  AORTA Ao Root diam: 3.40 cm MITRAL VALVE                TRICUSPID VALVE MV Area (PHT): 4.71 cm     TR Peak grad:   28.5 mmHg MV Decel Time: 161 msec     TR Vmax:        267.00 cm/s MV E velocity: 113.00 cm/s MV A velocity: 90.20 cm/s   SHUNTS MV E/A ratio:  1.25         Systemic VTI:  0.22 m                             Systemic Diam: 2.10 cm Dina Rich MD Electronically signed by Dina Rich MD Signature Date/Time: 03/06/2023/12:24:44 PM    Final    DG Chest Port 1 View  Result Date: 03/05/2023 CLINICAL DATA:  Shortness of breath. EXAM: PORTABLE CHEST 1 VIEW COMPARISON:  February 13, 2023 and February 06, 2023 FINDINGS: The heart size and mediastinal contours are within normal limits. Mild, diffusely increased interstitial lung markings are noted. This is very mildly increased in severity when compared to the prior exams. There is no evidence of focal consolidation, pleural effusion or pneumothorax. The visualized skeletal structures are unremarkable. IMPRESSION: Very mildly increased interstitial lung markings which may represent mild interstitial edema. Electronically Signed   By: Aram Candela M.D.   On: 03/05/2023 23:09     Scheduled Meds:  Chlorhexidine Gluconate Cloth  6 each Topical Daily   dextromethorphan-guaiFENesin  1 tablet  Oral BID   enoxaparin (LOVENOX) injection  40 mg Subcutaneous Q24H   ipratropium-albuterol  3 mL Nebulization Q6H   methylPREDNISolone (SOLU-MEDROL) injection  40 mg Intravenous  Q12H   mouth rinse  15 mL Mouth Rinse 4 times per day   pantoprazole (PROTONIX) IV  40 mg Intravenous Q12H   Continuous Infusions:  [START ON 03/07/2023] azithromycin     cefTRIAXone (ROCEPHIN)  IV       LOS: 0 days    Shon Hale M.D on 03/06/2023 at 7:45 PM  Go to [email or web address] - for contact info  [facility] - Office  [phone]  If 7PM-7AM, please contact night-coverage [email or web address] 03/06/2023, 7:45 PM

## 2023-03-06 NOTE — Progress Notes (Signed)
Patient awake and alert on HFNC salter at 5L sat 94%.  Will continue to monitor.

## 2023-03-06 NOTE — Progress Notes (Signed)
Patient has been placed on BIPAP.

## 2023-03-06 NOTE — ED Provider Notes (Signed)
COPD exacerbation. BiPaP. Needs repeat blood gas for disposition and decision making. Full code.   Physical Exam  BP 101/62   Pulse 88   Temp 99.3 F (37.4 C) (Oral)   Resp 17   Ht 5\' 6"  (1.676 m)   Wt 73 kg   SpO2 91%   BMI 25.98 kg/m   Physical Exam  Procedures  .Critical Care  Performed by: Marily Memos, MD Authorized by: Marily Memos, MD   Critical care provider statement:    Critical care time (minutes):  30   Critical care was necessary to treat or prevent imminent or life-threatening deterioration of the following conditions:  Respiratory failure   Critical care was time spent personally by me on the following activities:  Development of treatment plan with patient or surrogate, discussions with consultants, evaluation of patient's response to treatment, examination of patient, ordering and review of laboratory studies, ordering and review of radiographic studies, ordering and performing treatments and interventions, pulse oximetry, re-evaluation of patient's condition and review of old charts   ED Course / MDM    Medical Decision Making Amount and/or Complexity of Data Reviewed Labs: ordered. Radiology: ordered.  Risk Prescription drug management. Decision regarding hospitalization.   Venous blood gas improving. Patient stable per nursing report, d/w [facility] for admit.        Marily Memos, MD 03/06/23 [phone]

## 2023-03-06 NOTE — H&P (Signed)
History and Physical    Patient: Miguel Marsh ZOX:[id number] DOB: 17-Mar-1961 DOA: 03/05/2023 DOS: the patient was seen and examined on 03/06/2023 PCP: [name], NP  Patient coming from: Home  Chief Complaint:  Chief Complaint  Patient presents with   Shortness of Breath   HPI: AMMAR Marsh is a 62 y.o. male with medical history significant of hypertension, COPD, chronic respiratory failure on 3 LPM of oxygen at baseline, right lung cancer in remission who presents to the emergency department from home via EMS due to worsening shortness of breath.  He activated EMS, and on arrival of EMS team, patient was noted to evaluate hypoxic despite being on 3 LPM of oxygen at home,.  Treatment was provided and Solu-Medrol 25 mg x 1 was given prior to taking patient to the ED for further evaluation and management.  ED Course:  In the emergency department, he was tachypneic, tachycardic, other vital signs are within normal range.  Workup in the ED showed normocytic anemia, MCV 107.5 showed sodium 141, potassium 4.2, chloride 88, bicarb > 45, blood glucose 188, BUN 9, creatinine 0.49, magnesium 2.1, troponin x 2 was normal, BNP 183.0, VBG done showed respiratory acidosis with hypoxia and hypercapnia (pCO2 > 123), patient was transitioned to BiPAP.  Influenza A, B, SARS coronavirus 2, RSV was negative.  Blood culture pending. Chest x-ray showed very mildly increased interstitial lung markings which may represent mild interstitial edema Review of Systems: Review of systems as noted in the HPI. All other systems reviewed and are negative.   Past Medical History:  Diagnosis Date   Arthritis    Bronchitis    Cancer (HCC)    metastatic NSCLC (followed by [facility])   COPD (chronic obstructive pulmonary disease) (HCC)    HTN (hypertension)    Past Surgical History:  Procedure Laterality Date   LUNG BIOPSY     TOTAL HIP ARTHROPLASTY     TUMOR REMOVAL Right 12/04/2017    Social History:   reports that he has quit smoking. His smoking use included cigarettes. He smoked an average of .1 packs per day. He has never used smokeless tobacco. He reports current alcohol use. He reports that he does not use drugs.   No Known Allergies  No family history on file.    Prior to Admission medications   Medication Sig Start Date End Date Taking? Authorizing Provider  albuterol (PROVENTIL HFA;VENTOLIN HFA) 108 (90 Base) MCG/ACT inhaler Inhale 2 puffs into the lungs every 6 (six) hours as needed for wheezing. 07/17/18   Shon Hale, MD  albuterol (PROVENTIL) (2.5 MG/3ML) 0.083% nebulizer solution Inhale 2.5 mg into the lungs every 6 (six) hours as needed for wheezing or shortness of breath.  01/19/20   [provider]  BREZTRI AEROSPHERE 160-9-4.8 MCG/ACT AERO Inhale 2 puffs into the lungs 2 (two) times daily. 05/17/22   [provider]  cefdinir (OMNICEF) 300 MG capsule Take 1 capsule (300 mg total) by mouth 2 (two) times daily. 02/14/23   Dione Booze, MD  clonazePAM (KLONOPIN) 1 MG tablet Take 1 mg by mouth 2 (two) times daily. 05/18/22   [provider]  furosemide (LASIX) 40 MG tablet Take 1 tablet (40 mg total) by mouth daily. 02/14/23   Dione Booze, MD  hydrochlorothiazide (HYDRODIURIL) 25 MG tablet Take 25 mg by mouth every morning. 07/02/20   [provider]  omeprazole (PRILOSEC) 40 MG capsule Take 1 capsule by mouth daily. 11/06/16   [provider]  oxyCODONE (ROXICODONE) 15 MG immediate release tablet Take 15 mg by mouth 4 (four) times daily as needed for pain.  08/13/20   [provider]  OXYGEN Inhale 3 L into the lungs continuous.     [provider]    Physical Exam: BP 99/60   Pulse 85   Temp 97.7 F (36.5 C) (Oral)   Resp 18   Ht 5\' 6"  (1.676 m)   Wt 73 kg   SpO2 100%   BMI 25.98 kg/m   General: 62 y.o. year-old male.  ill appearing, deconditioned, somnolent but arousable, on BiPAP and in no acute distress.    HEENT: NCAT, PERRL Neck: Supple, trachea medial Cardiovascular: Regular rate and rhythm with no rubs or gallops.  Positive JVD noted.  +2 lower extremity edema bilaterally. 2/4 pulses in all 4 extremities. Respiratory: Diffuse decreased breath sounds with scattered wheezes.  Abdomen: Soft, nontender nondistended with normal bowel sounds x4 quadrants. Muskuloskeletal: No cyanosis, clubbing or edema noted bilaterally Neuro: No focal neurologic deficit, sensation, reflexes intact Skin: No ulcerative lesions noted or rashes Psychiatry:  Mood is appropriate for condition and setting          Labs on Admission:  Basic Metabolic Panel: Recent Labs  Lab 03/05/23 2326  NA 141  K 4.2  CL 88*  CO2 >45*  GLUCOSE 188*  BUN 9  CREATININE 0.49*  CALCIUM 8.6*  MG 2.1   Liver Function Tests: Recent Labs  Lab 03/05/23 2326  AST 14*  ALT 14  ALKPHOS 69  BILITOT 0.3  PROT 6.7  ALBUMIN 3.5   No results for input(s): "LIPASE", "AMYLASE" in the last 168 hours. No results for input(s): "AMMONIA" in the last 168 hours. CBC: Recent Labs  Lab 03/05/23 2326  WBC 5.2  NEUTROABS 3.9  HGB 12.1*  HCT 43.1  MCV 107.5*  PLT 252   Cardiac Enzymes: No results for input(s): "CKTOTAL", "CKMB", "CKMBINDEX", "TROPONINI" in the last 168 hours.  BNP (last 3 results) Recent Labs    02/13/23 2334 03/05/23 2326  BNP 41.0 183.0*    ProBNP (last 3 results) No results for input(s): "PROBNP" in the last 8760 hours.  CBG: No results for input(s): "GLUCAP" in the last 168 hours.  Radiological Exams on Admission: DG Chest Port 1 View  Result Date: 03/05/2023 CLINICAL DATA:  Shortness of breath. EXAM: PORTABLE CHEST 1 VIEW COMPARISON:  February 13, 2023 and February 06, 2023 FINDINGS: The heart size and mediastinal contours are within normal limits. Mild, diffusely increased interstitial lung markings are noted. This is very mildly increased in severity when compared to the prior exams. There is no  evidence of focal consolidation, pleural effusion or pneumothorax. The visualized skeletal structures are unremarkable. IMPRESSION: Very mildly increased interstitial lung markings which may represent mild interstitial edema. Electronically Signed   By: Aram Candela M.D.   On: 03/05/2023 23:09    EKG: I independently viewed the EKG done and my findings are as followed: Sinus tachycardia at rate 110 bpm  Assessment/Plan Present on Admission:  COPD with acute exacerbation (HCC)  Acute on chronic respiratory failure with hypoxia and hypercapnia (HCC)  GERD (gastroesophageal reflux disease)  Essential hypertension  Principal Problem:   COPD with acute exacerbation (HCC) Active Problems:   Essential hypertension   GERD (gastroesophageal reflux disease)   Acute on chronic respiratory failure with hypoxia and hypercapnia (HCC)   Elevated brain natriuretic peptide (BNP) level   Elevated MCV  COPD with acute exacerbation Acute  on chronic respiratory failure with hypoxia and hypercapnia Continue duo nebs, Mucinex, Solu-Medrol, azithromycin. Continue Protonix to prevent steroid-induced ulcer Continue incentive spirometry and flutter valve when patient is weaned off BiPAP Continue BiPAP with plan to wean patient off this and transition to supplemental oxygen to maintain O2 sat > 92%  as tolerated  Elevated BNP r/o CHF BNP 183 Chest x-ray was suggestive of mild interstitial edema Continue total input/output, daily weights and fluid restriction Hold off on IV Lasix at this time due to soft BP Continue heart healthy/carb modified diet  Echocardiogram done on 03/06/2018 showed LVEF of 62%.  G1 DD.  Echocardiogram will be done in the morning   Elevated MCV MCV 107.5, folate vitamin B12 levels will be checked  Essential hypertension Hold all meds at this time due to soft BP  GERD Continue Protonix   DVT prophylaxis: Lovenox  Advance Care Planning: DNR  Consults: None  Family  Communication: None at bedside  Severity of Illness: The appropriate patient status for this patient is OBSERVATION. Observation status is judged to be reasonable and necessary in order to provide the required intensity of service to ensure the patient's safety. The patient's presenting symptoms, physical exam findings, and initial radiographic and laboratory data in the context of their medical condition is felt to place them at decreased risk for further clinical deterioration. Furthermore, it is anticipated that the patient will be medically stable for discharge from the hospital within 2 midnights of admission.   Author: Frankey Shown, DO 03/06/2023 4:51 AM  For on call review [email or web address].

## 2023-03-07 DIAGNOSIS — J441 Chronic obstructive pulmonary disease with (acute) exacerbation: Secondary | ICD-10-CM | POA: Diagnosis not present

## 2023-03-07 DIAGNOSIS — J9621 Acute and chronic respiratory failure with hypoxia: Secondary | ICD-10-CM | POA: Diagnosis not present

## 2023-03-07 DIAGNOSIS — I10 Essential (primary) hypertension: Secondary | ICD-10-CM | POA: Diagnosis not present

## 2023-03-07 DIAGNOSIS — R7989 Other specified abnormal findings of blood chemistry: Secondary | ICD-10-CM | POA: Diagnosis not present

## 2023-03-07 LAB — COMPREHENSIVE METABOLIC PANEL
ALT: 14 U/L (ref 0–44)
AST: 13 U/L — ABNORMAL LOW (ref 15–41)
Albumin: 3.1 g/dL — ABNORMAL LOW (ref 3.5–5.0)
Alkaline Phosphatase: 63 U/L (ref 38–126)
BUN: 14 mg/dL (ref 8–23)
CO2: >45 mmol/L — ABNORMAL HIGH (ref 22–32)
Calcium: 8.4 mg/dL — ABNORMAL LOW (ref 8.9–10.3)
Chloride: 86 mmol/L — ABNORMAL LOW (ref 98–111)
Creatinine, Ser: 0.58 mg/dL — ABNORMAL LOW (ref 0.61–1.24)
GFR, Estimated: >60 mL/min (ref >60)
Glucose, Bld: 112 mg/dL — ABNORMAL HIGH (ref 70–99)
Potassium: 4.3 mmol/L (ref 3.5–5.1)
Sodium: 137 mmol/L (ref 135–145)
Total Bilirubin: 0.5 mg/dL (ref 0.3–1.2)
Total Protein: 6.1 g/dL — ABNORMAL LOW (ref 6.5–8.1)

## 2023-03-07 LAB — BLOOD GAS, VENOUS
Acid-Base Excess: UNDETERMINED mmol/L (ref 0.0–2.0)
Bicarbonate: UNDETERMINED mmol/L (ref 20.0–28.0)
Patient temperature: 37.4
pCO2, Ven: >123 mmHg (ref 44–60)
pH, Ven: 7.18 — CL (ref 7.25–7.43)

## 2023-03-07 LAB — CULTURE, BLOOD (ROUTINE X 2)

## 2023-03-07 MED ORDER — DOXYCYCLINE HYCLATE 100 MG PO TABS
100.0000 mg | ORAL_TABLET | Freq: Two times a day (BID) | ORAL | Status: DC
  Administered 2023-03-07 – 2023-03-09 (×5): 100 mg via ORAL
  Filled 2023-03-07 (×5): qty 1

## 2023-03-07 MED ORDER — MORPHINE SULFATE (PF) 2 MG/ML IV SOLN
1.0000 mg | Freq: Once | INTRAVENOUS | Status: AC | PRN
  Administered 2023-03-07: 1 mg via INTRAVENOUS
  Filled 2023-03-07: qty 1

## 2023-03-07 MED ORDER — PANTOPRAZOLE SODIUM 40 MG PO TBEC
40.0000 mg | DELAYED_RELEASE_TABLET | Freq: Two times a day (BID) | ORAL | Status: DC
  Administered 2023-03-07 – 2023-03-09 (×4): 40 mg via ORAL
  Filled 2023-03-07 (×4): qty 1

## 2023-03-07 MED ORDER — ARFORMOTEROL TARTRATE 15 MCG/2ML IN NEBU
15.0000 ug | INHALATION_SOLUTION | Freq: Two times a day (BID) | RESPIRATORY_TRACT | Status: DC
  Administered 2023-03-07 – 2023-03-09 (×5): 15 ug via RESPIRATORY_TRACT
  Filled 2023-03-07 (×5): qty 2

## 2023-03-07 MED ORDER — IPRATROPIUM-ALBUTEROL 0.5-2.5 (3) MG/3ML IN SOLN: 3.0000 mL | RESPIRATORY_TRACT | Status: DC | PRN

## 2023-03-07 NOTE — TOC Initial Note (Signed)
Transition of Care [facility]) - Initial/Assessment Note    Patient Details  Name: Miguel Marsh MRN: [id number] Date of Birth: 11-23-1960  Transition of [facility]) CM/SW Contact:    Karn Cassis, LCSW Phone Number: 03/07/2023, 9:21 AM  Clinical Narrative:  Pt admitted for COPD with acute exacerbation. Assessment completed due to high risk readmission score. Pt reports he lives alone. He has a CAP aid 3-4 hours a day, 7 days a week. Pt is on 3L home O2, but is unsure on agency who provides. He uses [employer] for transportation. Pt plans to return home when stable. No needs reported at this time. Discussed cocaine use with pt. He does not feel this is a problem for him, but said he plans to stop. Pt declines any resources at this time. TOC will continue to follow.                 Expected Discharge Plan: Home/Self Care Barriers to Discharge: Continued Medical Work up   Patient Goals and CMS Choice Patient states their goals for this hospitalization and ongoing recovery are:: return home   Choice offered to / list presented to : Patient [place] ownership interest in [facility].provided to::  (n/a)    Expected Discharge Plan and Services In-house Referral: Clinical Social Work   Post Acute Care Choice: Resumption of Svcs/PTA Provider Living arrangements for the past 2 months: Apartment                                      Prior Living Arrangements/Services Living arrangements for the past 2 months: Apartment Lives with:: Self Patient language and need for interpreter reviewed:: Yes Do you feel safe going back to the place where you live?: Yes      Need for Family Participation in Patient Care: No (Comment)   Current home services: DME (O2, electric scooter) Criminal Activity/Legal Involvement Pertinent to Current Situation/Hospitalization: No - Comment as needed  Activities of Daily Living   ADL Screening (condition at time of admission) Patient's  cognitive ability adequate to safely complete daily activities?: No  Permission Sought/Granted                  Emotional Assessment     Affect (typically observed): Appropriate Orientation: : Oriented to Self, Oriented to Place, Oriented to  Time, Oriented to Situation Alcohol / Substance Use: Illicit Drugs Psych Involvement: No (comment)  Admission diagnosis:  COPD exacerbation (HCC) [J44.1] COPD with acute exacerbation (HCC) [J44.1] Acute on chronic respiratory failure with hypoxia and hypercapnia (HCC) [W09.81, J96.22] Patient Active Problem List   Diagnosis Date Noted   Elevated brain natriuretic peptide (BNP) level 03/06/2023   Elevated MCV 03/06/2023   Acute metabolic encephalopathy 02/01/2023   Leukocytosis 10/06/2020   Goals of care, counseling/discussion    Palliative care by specialist    DNR (do not resuscitate) discussion    Hyperglycemia 04/12/2020   Chronic respiratory failure with hypoxia and hypercapnia (HCC) 01/22/2020   Acute respiratory failure (HCC) 01/23/2018   Acute on chronic respiratory failure with hypoxia and hypercapnia (HCC) 01/23/2018   Acute on chronic respiratory failure with hypoxia (HCC) 01/01/2018   Obesity, Class III, BMI 40-49.9 (morbid obesity) (HCC) 01/01/2018   COPD with exacerbation (HCC) 01/01/2018   Essential hypertension 12/09/2015   Non-small cell cancer of right lung (in remission) 12/09/2015   GERD (gastroesophageal reflux disease) 12/09/2015  COPD with acute exacerbation (HCC) 12/17/2014   Tobacco use disorder 12/17/2014   COPD exacerbation (HCC) 12/17/2014   Obesity 12/17/2014   Dyspnea    Difficulty in walking(719.7) 04/02/2013   Hip weakness 04/02/2013   PCP:  [name], NP Pharmacy:   [facility] [phone] - [place], [place] - [address] [place] [address] Phone: [phone] Fax: [phone]     Social Determinants of Health  (SDOH) Social History: SDOH Screenings   Tobacco Use: Medium Risk (02/13/2023)   SDOH Interventions:     Readmission Risk Interventions    03/07/2023    9:19 AM  Readmission Risk Prevention Plan  Transportation Screening Complete  HRI or Home Care Consult Complete  Social Work Consult for Recovery Care Planning/Counseling Complete  Palliative Care Screening Not Applicable  Medication Review Oceanographer) Complete

## 2023-03-07 NOTE — Progress Notes (Signed)
PROGRESS NOTE     Case Miguel Marsh, is a 62 y.o. male, DOB - 02-19-1961, EAV:[id number]  Admit date - 03/05/2023   Admitting Physician Courage Mariea Clonts, MD  Outpatient Primary MD for the patient is [name], NP  LOS - 1  Chief Complaint  Patient presents with   Shortness of Breath        Brief Narrative:   62 y.o. male with medical history significant of hypertension, COPD, chronic respiratory failure on 3 LPM of oxygen at baseline, right lung cancer admitted on 03/06/2023 with acute on chronic hypoxic respiratory failure secondary to COPD exacerbation  Assessment and Plan: 1) acute COPD exacerbation--no definite pneumonia Appreciated. -Antibiotics will be transitioned to oral doxycycline -Continue mucolytic's, steroids, bronchodilators and the use of flutter valve. -Continue oxygen supplementation and follow clinical response. -Hopefully discharge in the next 24 hours.  2)HFpEF-diastolic dysfunction CHF -Patient presenting with worsening hypoxia, elevated BNP and chest x-ray suggestive of pulmonary venous congestion -Echo from 03/06/2023 with EF of 60 to 65% -Prior echo from 2019 was consistent with EF of 65 to 60% and grade 1 diastolic dysfunction -Continue treatment with Lasix -Low-sodium diet discussed with patient -Continue daily weights and strict intake and output  3)GERD--continue Protonix especially while on steroids -Lifestyle changes discussed with patient.  4) social/ethics--- patient admits to very frequent cocaine -Not interested in drug rehab program for -Patient is a DNR/DNI -Continue supportive care.  5) acute on chronic hypoxic respiratory failure--due to #1 #2 treatment as mentioned above. -At baseline patient uses 2-3 L of oxygen via nasal cannula -No longer requiring BiPAP; while at rest 3 L nasal cannula supplementation in place.  Status is: Inpatient   Disposition: The patient is from: Home              Anticipated d/c is to: Home               Anticipated d/c date is: 1-2 days              Patient currently is not medically stable to d/c. Barriers: Not Clinically Stable-   Code Status :  -  Code Status: DNR   Family Communication:    NA (patient is alert, awake and coherent)   DVT Prophylaxis  :   - SCDs   enoxaparin (LOVENOX) injection 40 mg Start: 03/06/23 1000 SCDs Start: 03/06/23 0352   Lab Results  Component Value Date   PLT 244 03/06/2023    Inpatient Medications  Scheduled Meds:  arformoterol  15 mcg Nebulization BID   Chlorhexidine Gluconate Cloth  6 each Topical Daily   dextromethorphan-guaiFENesin  1 tablet Oral BID   doxycycline  100 mg Oral Q12H   enoxaparin (LOVENOX) injection  40 mg Subcutaneous Q24H   furosemide  40 mg Oral Daily   ipratropium-albuterol  3 mL Nebulization Q6H   methylPREDNISolone (SOLU-MEDROL) injection  40 mg Intravenous Q12H   mouth rinse  15 mL Mouth Rinse 4 times per day   pantoprazole  40 mg Oral BID   Continuous Infusions:   PRN Meds:.acetaminophen **OR** acetaminophen, ipratropium-albuterol, ondansetron **OR** ondansetron (ZOFRAN) IV, mouth rinse   Anti-infectives (From admission, onward)    Start     Dose/Rate Route Frequency Ordered Stop   03/07/23 1130  doxycycline (VIBRA-TABS) tablet 100 mg        100 mg Oral Every 12 hours 03/07/23 1041     03/07/23 0000  azithromycin (ZITHROMAX) 500 mg in sodium chloride 0.9 % 250 mL IVPB  Status:  Discontinued        500 mg 250 mL/hr over 60 Minutes Intravenous Every 24 hours 03/06/23 0521 03/07/23 1041   03/06/23 2200  cefTRIAXone (ROCEPHIN) 2 g in sodium chloride 0.9 % 100 mL IVPB  Status:  Discontinued        2 g 200 mL/hr over 30 Minutes Intravenous Every 24 hours 03/06/23 0521 03/07/23 1041   03/05/23 2230  cefTRIAXone (ROCEPHIN) 2 g in sodium chloride 0.9 % 100 mL IVPB  Status:  Discontinued        2 g 200 mL/hr over 30 Minutes Intravenous Every 24 hours 03/05/23 2223 03/06/23 0520   03/05/23 2230  azithromycin  (ZITHROMAX) 500 mg in sodium chloride 0.9 % 250 mL IVPB  Status:  Discontinued        500 mg 250 mL/hr over 60 Minutes Intravenous Every 24 hours 03/05/23 2223 03/06/23 0520       Subjective: Quavis Banducci still having difficulty speaking in full sentences; no chest pain, no nausea, no vomiting.  3 L nasal cannula supplementation in place.  Positive expiratory wheezing and decreased breath sounds appreciated on examination.   Objective: Vitals:   03/07/23 0742 03/07/23 0800 03/07/23 0817 03/07/23 0900  BP:  (!) 158/90    Pulse:    93  Resp:  18  20  Temp:   98.4 F (36.9 C)   TempSrc:   Oral   SpO2: 98%  96% 95%  Weight:      Height:        Intake/Output Summary (Last 24 hours) at 03/07/2023 1044 Last data filed at 03/07/2023 0900 Gross per 24 hour  Intake 830 ml  Output 3075 ml  Net -2245 ml   Filed Weights   03/05/23 2213 03/06/23 0519 03/07/23 0440  Weight: 73 kg 74.4 kg 74.5 kg    Physical Exam General exam: Alert, awake, oriented x 3; no chest pain, no nausea, no vomiting.  Still having difficulty speaking in full sentences, 3 L nasal cannula supplementation in place. Respiratory system: Breath sounds at the bases; positive expiratory wheezing and bilateral rhonchi appreciated.  No using accessory muscles. Cardiovascular system: Rate controlled, no rubs, no gallops, no JVD. Gastrointestinal system: Abdomen is nondistended, soft and nontender. No organomegaly or masses felt. Normal bowel sounds heard. Central nervous system: Alert and oriented. No focal neurological deficits. Extremities: No cyanosis or clubbing; trace edema appreciated bilaterally. Skin: No petechiae. Psychiatry: Judgement and insight appear normal. Mood & affect appropriate.   Data Reviewed: I have personally reviewed following labs and imaging studies  CBC: Recent Labs  Lab 03/05/23 2326 03/06/23 0528  WBC 5.2 5.1  NEUTROABS 3.9  --   HGB 12.1* 11.9*  HCT 43.1 43.3  MCV 107.5* 109.6*  PLT  252 244   Basic Metabolic Panel: Recent Labs  Lab 03/05/23 2326 03/06/23 0155 03/07/23 0439  NA 141  --  137  K 4.2  --  4.3  CL 88*  --  86*  CO2 >45*  --  >45*  GLUCOSE 188*  --  112*  BUN 9  --  14  CREATININE 0.49*  --  0.58*  CALCIUM 8.6*  --  8.4*  MG 2.1  --   --   PHOS  --  4.1  --    GFR: Estimated Creatinine Clearance: 87.5 mL/min (A) (by C-G formula based on SCr of 0.58 mg/dL (L)).  Liver Function Tests: Recent Labs  Lab 03/05/23 2326 03/07/23 0439  AST  14* 13*  ALT 14 14  ALKPHOS 69 63  BILITOT 0.3 0.5  PROT 6.7 6.1*  ALBUMIN 3.5 3.1*   Recent Results (from the past 240 hour(s))  Resp panel by RT-PCR (RSV, Flu A&B, Covid) Anterior Nasal Swab     Status: None   Collection Time: 03/05/23 10:40 PM   Specimen: Anterior Nasal Swab  Result Value Ref Range Status   SARS Coronavirus 2 by RT PCR NEGATIVE NEGATIVE Final    Comment: (NOTE) SARS-CoV-2 target nucleic acids are NOT DETECTED.  The SARS-CoV-2 RNA is generally detectable in upper respiratory specimens during the acute phase of infection. The lowest concentration of SARS-CoV-2 viral copies this assay can detect is 138 copies/mL. A negative result does not preclude SARS-Cov-2 infection and should not be used as the sole basis for treatment or other patient management decisions. A negative result may occur with  improper specimen collection/handling, submission of specimen other than nasopharyngeal swab, presence of viral mutation(s) within the areas targeted by this assay, and inadequate number of viral copies(<138 copies/mL). A negative result must be combined with clinical observations, patient history, and epidemiological information. The expected result is Negative.  Fact Sheet for Patients:  BloggerCourse.com  Fact Sheet for Healthcare Providers:  SeriousBroker.it  This test is no t yet approved or cleared by the [place] FDA and  has  been authorized for detection and/or diagnosis of SARS-CoV-2 by FDA under an Emergency Use Authorization (EUA). This EUA will remain  in effect (meaning this test can be used) for the duration of the COVID-19 declaration under Section 564(b)(1) of the Act, 21 U.S.C.section 360bbb-3(b)(1), unless the authorization is terminated  or revoked sooner.       Influenza A by PCR NEGATIVE NEGATIVE Final   Influenza B by PCR NEGATIVE NEGATIVE Final    Comment: (NOTE) The Xpert Xpress SARS-CoV-2/FLU/RSV plus assay is intended as an aid in the diagnosis of influenza from Nasopharyngeal swab specimens and should not be used as a sole basis for treatment. Nasal washings and aspirates are unacceptable for Xpert Xpress SARS-CoV-2/FLU/RSV testing.  Fact Sheet for Patients: BloggerCourse.com  Fact Sheet for Healthcare Providers: SeriousBroker.it  This test is not yet approved or cleared by the [place] FDA and has been authorized for detection and/or diagnosis of SARS-CoV-2 by FDA under an Emergency Use Authorization (EUA). This EUA will remain in effect (meaning this test can be used) for the duration of the COVID-19 declaration under Section 564(b)(1) of the Act, 21 U.S.C. section 360bbb-3(b)(1), unless the authorization is terminated or revoked.     Resp Syncytial Virus by PCR NEGATIVE NEGATIVE Final    Comment: (NOTE) Fact Sheet for Patients: BloggerCourse.com  Fact Sheet for Healthcare Providers: SeriousBroker.it  This test is not yet approved or cleared by the [place] FDA and has been authorized for detection and/or diagnosis of SARS-CoV-2 by FDA under an Emergency Use Authorization (EUA). This EUA will remain in effect (meaning this test can be used) for the duration of the COVID-19 declaration under Section 564(b)(1) of the Act, 21 U.S.C. section 360bbb-3(b)(1), unless the  authorization is terminated or revoked.  Performed at [facility], [address], [place], [place] [address]   Blood culture (routine x 2)     Status: None (Preliminary result)   Collection Time: 03/05/23 10:52 PM   Specimen: BLOOD  Result Value Ref Range Status   Specimen Description BLOOD BLOOD RIGHT ARM  Final   Special Requests   Final  BOTTLES DRAWN AEROBIC AND ANAEROBIC Blood Culture adequate volume   Culture   Final    NO GROWTH 2 DAYS Performed at [facility], [address], [place], [place] [address]    Report Status PENDING  Incomplete  Blood culture (routine x 2)     Status: None (Preliminary result)   Collection Time: 03/05/23 11:36 PM   Specimen: BLOOD  Result Value Ref Range Status   Specimen Description BLOOD BLOOD LEFT WRIST  Final   Special Requests   Final    BOTTLES DRAWN AEROBIC AND ANAEROBIC Blood Culture adequate volume   Culture   Final    NO GROWTH 2 DAYS Performed at [facility], [address], [place], [place] [address]    Report Status PENDING  Incomplete  MRSA Next Gen by PCR, Nasal     Status: None   Collection Time: 03/06/23  8:30 AM   Specimen: Nasal Mucosa; Nasal Swab  Result Value Ref Range Status   MRSA by PCR Next Gen NOT DETECTED NOT DETECTED Final    Comment: (NOTE) The GeneXpert MRSA Assay (FDA approved for NASAL specimens only), is one component of a comprehensive MRSA colonization surveillance program. It is not intended to diagnose MRSA infection nor to guide or monitor treatment for MRSA infections. Test performance is not FDA approved in patients less than 32 years old. Performed at [facility], [address], [place], [place] [address]     Radiology Studies: ECHOCARDIOGRAM COMPLETE  Result Date: 03/06/2023    ECHOCARDIOGRAM REPORT   Patient Name:   FUMIO BERARD Date of Exam: 03/06/2023 Medical Rec #:  [id number]        Height:       66.0 in Accession #:    [id number]       Weight:       164.0 lb Date of Birth:  [birth year]         BSA:          1.838 m Patient Age:    61 years         BP:           124/52 mmHg Patient Gender: M                HR:           85 bpm. [facility] Procedure: 2D Echo, Cardiac Doppler and Color Doppler Indications:    CHF-Acute Diastolic I50.31  History:        Patient has no prior history of Echocardiogram examinations.                 Signs/Symptoms:Altered Mental Status; Risk Factors:Hypertension                 and Former Smoker.  Sonographer:    Aron Baba Referring Phys: [id number] OLADAPO ADEFESO  Sonographer Comments: Image acquisition challenging due to uncooperative patient and Image acquisition challenging due to COPD. IMPRESSIONS  1. Left ventricular ejection fraction, by estimation, is 60 to 65%. The left ventricle has normal function. Left ventricular endocardial border not optimally defined to evaluate regional wall motion. Left ventricular diastolic parameters were normal.  2. Right ventricular systolic function is normal. The right ventricular size is normal.  3. The mitral valve is normal in structure. Trivial mitral valve regurgitation. No evidence of mitral stenosis.  4. The tricuspid valve is abnormal.  5. The aortic valve was not well visualized. Aortic valve regurgitation is not visualized.  6.  Technically difficult study due to altered mental status, noncooperation with study. FINDINGS  Left Ventricle: Left ventricular ejection fraction, by estimation, is 60 to 65%. The left ventricle has normal function. Left ventricular endocardial border not optimally defined to evaluate regional wall motion. The left ventricular internal cavity size was normal in size. There is no left ventricular hypertrophy. Left ventricular diastolic parameters were normal. Right Ventricle: The right ventricular size is normal. Right vetricular wall thickness was not well visualized. Right ventricular systolic function is normal. Left Atrium: Left atrial size was not well visualized. Right Atrium:  Right atrial size was not well visualized. Pericardium: There is no evidence of pericardial effusion. Mitral Valve: The mitral valve is normal in structure. Trivial mitral valve regurgitation. No evidence of mitral valve stenosis. Tricuspid Valve: The tricuspid valve is abnormal. Tricuspid valve regurgitation is mild . No evidence of tricuspid stenosis. Aortic Valve: The aortic valve was not well visualized. Aortic valve regurgitation is not visualized. Aortic valve mean gradient measures 4.0 mmHg. Aortic valve peak gradient measures 9.7 mmHg. Aortic valve area, by VTI measures 3.24 cm. Pulmonic Valve: The pulmonic valve was not well visualized. Pulmonic valve regurgitation is not visualized. No evidence of pulmonic stenosis. Aorta: The aortic root is normal in size and structure. IAS/Shunts: The interatrial septum was not well visualized.  LEFT VENTRICLE PLAX 2D LVIDd:         4.40 cm   Diastology LVIDs:         2.70 cm   LV e' medial:    10.10 cm/s LV PW:         1.10 cm   LV E/e' medial:  11.2 LV IVS:        1.00 cm   LV e' lateral:   15.50 cm/s LVOT diam:     2.10 cm   LV E/e' lateral: 7.3 LV SV:         77 LV SV Index:   42 LVOT Area:     3.46 cm  RIGHT VENTRICLE RV S prime:     15.10 cm/s TAPSE (M-mode): 2.4 cm LEFT ATRIUM         Index LA diam:    3.00 cm 1.63 cm/m  AORTIC VALVE AV Area (Vmax):    3.12 cm AV Area (Vmean):   2.98 cm AV Area (VTI):     3.24 cm AV Vmax:           156.10 cm/s AV Vmean:          89.049 cm/s AV VTI:            0.238 m AV Peak Grad:      9.7 mmHg AV Mean Grad:      4.0 mmHg LVOT Vmax:         140.67 cm/s LVOT Vmean:        76.585 cm/s LVOT VTI:          0.222 m LVOT/AV VTI ratio: 0.94  AORTA Ao Root diam: 3.40 cm MITRAL VALVE                TRICUSPID VALVE MV Area (PHT): 4.71 cm     TR Peak grad:   28.5 mmHg MV Decel Time: 161 msec     TR Vmax:        267.00 cm/s MV E velocity: 113.00 cm/s MV A velocity: 90.20 cm/s   SHUNTS MV E/A ratio:  1.25         Systemic VTI:  0.22 m                              Systemic Diam: 2.10 cm Dina Rich MD Electronically signed by Dina Rich MD Signature Date/Time: 03/06/2023/12:24:44 PM    Final    DG Chest Port 1 View  Result Date: 03/05/2023 CLINICAL DATA:  Shortness of breath. EXAM: PORTABLE CHEST 1 VIEW COMPARISON:  February 13, 2023 and February 06, 2023 FINDINGS: The heart size and mediastinal contours are within normal limits. Mild, diffusely increased interstitial lung markings are noted. This is very mildly increased in severity when compared to the prior exams. There is no evidence of focal consolidation, pleural effusion or pneumothorax. The visualized skeletal structures are unremarkable. IMPRESSION: Very mildly increased interstitial lung markings which may represent mild interstitial edema. Electronically Signed   By: Aram Candela M.D.   On: 03/05/2023 23:09     Scheduled Meds:  arformoterol  15 mcg Nebulization BID   Chlorhexidine Gluconate Cloth  6 each Topical Daily   dextromethorphan-guaiFENesin  1 tablet Oral BID   doxycycline  100 mg Oral Q12H   enoxaparin (LOVENOX) injection  40 mg Subcutaneous Q24H   furosemide  40 mg Oral Daily   ipratropium-albuterol  3 mL Nebulization Q6H   methylPREDNISolone (SOLU-MEDROL) injection  40 mg Intravenous Q12H   mouth rinse  15 mL Mouth Rinse 4 times per day   pantoprazole  40 mg Oral BID   Continuous Infusions:   LOS: 1 day    Vassie Loll M.D on 03/07/2023 at 10:44 AM  Go to [email or web address] - for contact info  [facility] - Office  [phone]  If 7PM-7AM, please contact night-coverage [email or web address] 03/07/2023, 10:44 AM

## 2023-03-07 NOTE — Progress Notes (Signed)
Nurse called for patient to go on BIPAP per patient request, patient on BIPAP 5-10 seconds and states "no I can't do this take it off now". BIPAP removed, nurse informed and patient back on o2 from3.5 decreased to 2lpm cann

## 2023-03-08 DIAGNOSIS — J441 Chronic obstructive pulmonary disease with (acute) exacerbation: Secondary | ICD-10-CM | POA: Diagnosis not present

## 2023-03-08 DIAGNOSIS — R7989 Other specified abnormal findings of blood chemistry: Secondary | ICD-10-CM | POA: Diagnosis not present

## 2023-03-08 DIAGNOSIS — I10 Essential (primary) hypertension: Secondary | ICD-10-CM | POA: Diagnosis not present

## 2023-03-08 DIAGNOSIS — J9621 Acute and chronic respiratory failure with hypoxia: Secondary | ICD-10-CM | POA: Diagnosis not present

## 2023-03-08 MED ORDER — LIDOCAINE 5 % EX PTCH
1.0000 | MEDICATED_PATCH | CUTANEOUS | Status: DC
  Administered 2023-03-08: 1 via TRANSDERMAL
  Filled 2023-03-08: qty 1

## 2023-03-08 NOTE — Progress Notes (Signed)
PROGRESS NOTE     Miguel Marsh, is a 62 y.o. male, DOB - 1961/10/19, ZOX:[id number]  Admit date - 03/05/2023   Admitting Physician Courage Mariea Clonts, MD  Outpatient Primary MD for the patient is [name], NP  LOS - 2  Chief Complaint  Patient presents with   Shortness of Breath        Brief Narrative:   62 y.o. male with medical history significant of hypertension, COPD, chronic respiratory failure on 3 LPM of oxygen at baseline, right lung cancer admitted on 03/06/2023 with acute on chronic hypoxic respiratory failure secondary to COPD exacerbation  Assessment and Plan: 1) acute COPD exacerbation--no definite pneumonia Appreciated. -Continue oral doxycycline. -Continue mucolytic's, steroids, bronchodilators and the use of flutter valve. -Continue oxygen supplementation and follow clinical response. -Hopefully discharge on 03/09/2023 prior to maintain. -Will discuss with TOC possibility to assist patient requiring a wheelchair to facilitate long distance ambulation.  2)HFpEF-diastolic dysfunction CHF -Patient presenting with worsening hypoxia, elevated BNP and chest x-ray suggestive of pulmonary venous congestion -Echo from 03/06/2023 with EF of 60 to 65% -Prior echo from 2019 was consistent with EF of 65 to 60% and grade 1 diastolic dysfunction -Continue treatment with Lasix -Low-sodium diet discussed with patient -Continue daily weights and strict intake and output.  3)GERD--continue Protonix especially while on steroids -Lifestyle changes discussed with patient.  4) social/ethics--- patient admits to very frequent cocaine -Not interested in drug rehab program for -Patient is a DNR/DNI -Continue supportive care.  5) acute on chronic hypoxic respiratory failure--due to #1 #2 treatment as mentioned above. -At baseline patient uses 2-3 L of oxygen via nasal cannula -No longer requiring BiPAP; while at rest 3 L nasal cannula supplementation in place. -Saturation  improved and essentially at baseline with chronic supplementation.  Status is: Inpatient   Disposition: The patient is from: Home              Anticipated d/c is to: Home              Anticipated d/c date is: 1-2 days              Patient currently is not medically stable to d/c. Barriers: Not Clinically Stable-   Code Status :  -  Code Status: DNR   Family Communication:    NA (patient is alert, awake and coherent)   DVT Prophylaxis  :   - SCDs   enoxaparin (LOVENOX) injection 40 mg Start: 03/06/23 1000 SCDs Start: 03/06/23 0352   Lab Results  Component Value Date   PLT 244 03/06/2023    Inpatient Medications  Scheduled Meds:  arformoterol  15 mcg Nebulization BID   dextromethorphan-guaiFENesin  1 tablet Oral BID   doxycycline  100 mg Oral Q12H   enoxaparin (LOVENOX) injection  40 mg Subcutaneous Q24H   furosemide  40 mg Oral Daily   methylPREDNISolone (SOLU-MEDROL) injection  40 mg Intravenous Q12H   mouth rinse  15 mL Mouth Rinse 4 times per day   pantoprazole  40 mg Oral BID   Continuous Infusions:   PRN Meds:.acetaminophen **OR** acetaminophen, ipratropium-albuterol, ondansetron **OR** ondansetron (ZOFRAN) IV, mouth rinse   Anti-infectives (From admission, onward)    Start     Dose/Rate Route Frequency Ordered Stop   03/07/23 1130  doxycycline (VIBRA-TABS) tablet 100 mg        100 mg Oral Every 12 hours 03/07/23 1041     03/07/23 0000  azithromycin (ZITHROMAX) 500 mg in sodium chloride 0.9 %  250 mL IVPB  Status:  Discontinued        500 mg 250 mL/hr over 60 Minutes Intravenous Every 24 hours 03/06/23 0521 03/07/23 1041   03/06/23 2200  cefTRIAXone (ROCEPHIN) 2 g in sodium chloride 0.9 % 100 mL IVPB  Status:  Discontinued        2 g 200 mL/hr over 30 Minutes Intravenous Every 24 hours 03/06/23 0521 03/07/23 1041   03/05/23 2230  cefTRIAXone (ROCEPHIN) 2 g in sodium chloride 0.9 % 100 mL IVPB  Status:  Discontinued        2 g 200 mL/hr over 30 Minutes  Intravenous Every 24 hours 03/05/23 2223 03/06/23 0520   03/05/23 2230  azithromycin (ZITHROMAX) 500 mg in sodium chloride 0.9 % 250 mL IVPB  Status:  Discontinued        500 mg 250 mL/hr over 60 Minutes Intravenous Every 24 hours 03/05/23 2223 03/06/23 0520       Subjective: Kyra Searles good sitting saturation on chronic 2-3 L nasal cannula supplementation.  Mild difficulty speaking in full sentences and expressing short winded sensation with physical activity.  Patient denies chest pain.   Objective: Vitals:   03/07/23 2330 03/08/23 0431 03/08/23 0852 03/08/23 1303  BP: (!) 142/65 (!) 156/80  (!) 155/70  Pulse: 91 81 89 75  Resp:  18 18   Temp: 99 F (37.2 C) 98.5 F (36.9 C)  98.6 F (37 C)  TempSrc: Oral   Oral  SpO2: 99% 96% 98% 95%  Weight:      Height:        Intake/Output Summary (Last 24 hours) at 03/08/2023 1721 Last data filed at 03/08/2023 1320 Gross per 24 hour  Intake 840 ml  Output --  Net 840 ml   Filed Weights   03/06/23 0519 03/07/23 0440 03/07/23 1146  Weight: 74.4 kg 74.5 kg 73 kg    Physical Exam General exam: Alert, awake, oriented x 3; no chest pain, no nausea, no vomiting.  Very little difficulty speaking in full sentences, but expressing dyspnea on exertion and limited physical activity. Respiratory system: Positive rhonchi bilaterally; mild expiratory wheezing appreciated.  No using accessory muscles.  No frank crackles appreciated. Cardiovascular system:RRR.  No rubs or gallops. Gastrointestinal system: Abdomen is nondistended, soft and nontender. No organomegaly or masses felt. Normal bowel sounds heard. Central nervous system: Alert and oriented. No focal neurological deficits. Extremities: No cyanosis, clubbing or significant edema. Skin: No petechiae. Psychiatry: Judgement and insight appear normal. Mood & affect appropriate.   Data Reviewed: I have personally reviewed following labs and imaging studies  CBC: Recent Labs  Lab  03/05/23 2326 03/06/23 0528  WBC 5.2 5.1  NEUTROABS 3.9  --   HGB 12.1* 11.9*  HCT 43.1 43.3  MCV 107.5* 109.6*  PLT 252 244   Basic Metabolic Panel: Recent Labs  Lab 03/05/23 2326 03/06/23 0155 03/07/23 0439  NA 141  --  137  K 4.2  --  4.3  CL 88*  --  86*  CO2 >45*  --  >45*  GLUCOSE 188*  --  112*  BUN 9  --  14  CREATININE 0.49*  --  0.58*  CALCIUM 8.6*  --  8.4*  MG 2.1  --   --   PHOS  --  4.1  --    GFR: Estimated Creatinine Clearance: 87.5 mL/min (A) (by C-G formula based on SCr of 0.58 mg/dL (L)).  Liver Function Tests: Recent Labs  Lab 03/05/23  2326 03/07/23 0439  AST 14* 13*  ALT 14 14  ALKPHOS 69 63  BILITOT 0.3 0.5  PROT 6.7 6.1*  ALBUMIN 3.5 3.1*   Recent Results (from the past 240 hour(s))  Resp panel by RT-PCR (RSV, Flu A&B, Covid) Anterior Nasal Swab     Status: None   Collection Time: 03/05/23 10:40 PM   Specimen: Anterior Nasal Swab  Result Value Ref Range Status   SARS Coronavirus 2 by RT PCR NEGATIVE NEGATIVE Final    Comment: (NOTE) SARS-CoV-2 target nucleic acids are NOT DETECTED.  The SARS-CoV-2 RNA is generally detectable in upper respiratory specimens during the acute phase of infection. The lowest concentration of SARS-CoV-2 viral copies this assay can detect is 138 copies/mL. A negative result does not preclude SARS-Cov-2 infection and should not be used as the sole basis for treatment or other patient management decisions. A negative result may occur with  improper specimen collection/handling, submission of specimen other than nasopharyngeal swab, presence of viral mutation(s) within the areas targeted by this assay, and inadequate number of viral copies(<138 copies/mL). A negative result must be combined with clinical observations, patient history, and epidemiological information. The expected result is Negative.  Fact Sheet for Patients:  BloggerCourse.com  Fact Sheet for Healthcare Providers:   SeriousBroker.it  This test is no t yet approved or cleared by the [place] FDA and  has been authorized for detection and/or diagnosis of SARS-CoV-2 by FDA under an Emergency Use Authorization (EUA). This EUA will remain  in effect (meaning this test can be used) for the duration of the COVID-19 declaration under Section 564(b)(1) of the Act, 21 U.S.C.section 360bbb-3(b)(1), unless the authorization is terminated  or revoked sooner.       Influenza A by PCR NEGATIVE NEGATIVE Final   Influenza B by PCR NEGATIVE NEGATIVE Final    Comment: (NOTE) The Xpert Xpress SARS-CoV-2/FLU/RSV plus assay is intended as an aid in the diagnosis of influenza from Nasopharyngeal swab specimens and should not be used as a sole basis for treatment. Nasal washings and aspirates are unacceptable for Xpert Xpress SARS-CoV-2/FLU/RSV testing.  Fact Sheet for Patients: BloggerCourse.com  Fact Sheet for Healthcare Providers: SeriousBroker.it  This test is not yet approved or cleared by the [place] FDA and has been authorized for detection and/or diagnosis of SARS-CoV-2 by FDA under an Emergency Use Authorization (EUA). This EUA will remain in effect (meaning this test can be used) for the duration of the COVID-19 declaration under Section 564(b)(1) of the Act, 21 U.S.C. section 360bbb-3(b)(1), unless the authorization is terminated or revoked.     Resp Syncytial Virus by PCR NEGATIVE NEGATIVE Final    Comment: (NOTE) Fact Sheet for Patients: BloggerCourse.com  Fact Sheet for Healthcare Providers: SeriousBroker.it  This test is not yet approved or cleared by the [place] FDA and has been authorized for detection and/or diagnosis of SARS-CoV-2 by FDA under an Emergency Use Authorization (EUA). This EUA will remain in effect (meaning this test can be used) for  the duration of the COVID-19 declaration under Section 564(b)(1) of the Act, 21 U.S.C. section 360bbb-3(b)(1), unless the authorization is terminated or revoked.  Performed at [facility], [address] [place], [place] [address]   Blood culture (routine x 2)     Status: None (Preliminary result)   Collection Time: 03/05/23 10:52 PM   Specimen: BLOOD  Result Value Ref Range Status   Specimen Description BLOOD BLOOD RIGHT ARM  Final   Special Requests  Final    BOTTLES DRAWN AEROBIC AND ANAEROBIC Blood Culture adequate volume   Culture   Final    NO GROWTH 3 DAYS Performed at [facility], [address], [place], [place] [address]    Report Status PENDING  Incomplete  Blood culture (routine x 2)     Status: None (Preliminary result)   Collection Time: 03/05/23 11:36 PM   Specimen: BLOOD  Result Value Ref Range Status   Specimen Description BLOOD BLOOD LEFT WRIST  Final   Special Requests   Final    BOTTLES DRAWN AEROBIC AND ANAEROBIC Blood Culture adequate volume   Culture   Final    NO GROWTH 3 DAYS Performed at [facility], [address], [place], [place] [address]    Report Status PENDING  Incomplete  MRSA Next Gen by PCR, Nasal     Status: None   Collection Time: 03/06/23  8:30 AM   Specimen: Nasal Mucosa; Nasal Swab  Result Value Ref Range Status   MRSA by PCR Next Gen NOT DETECTED NOT DETECTED Final    Comment: (NOTE) The GeneXpert MRSA Assay (FDA approved for NASAL specimens only), is one component of a comprehensive MRSA colonization surveillance program. It is not intended to diagnose MRSA infection nor to guide or monitor treatment for MRSA infections. Test performance is not FDA approved in patients less than 34 years old. Performed at [facility], [address] [place], [place] [address]     Radiology Studies: No results found.   Scheduled Meds:  arformoterol  15 mcg Nebulization BID   dextromethorphan-guaiFENesin  1 tablet Oral BID   doxycycline   100 mg Oral Q12H   enoxaparin (LOVENOX) injection  40 mg Subcutaneous Q24H   furosemide  40 mg Oral Daily   methylPREDNISolone (SOLU-MEDROL) injection  40 mg Intravenous Q12H   mouth rinse  15 mL Mouth Rinse 4 times per day   pantoprazole  40 mg Oral BID   Continuous Infusions:   LOS: 2 days    Vassie Loll M.D on 03/08/2023 at 5:21 PM  Go to [email or web address] - for contact info  [facility] - Office  [phone]  If 7PM-7AM, please contact night-coverage [email or web address] 03/08/2023, 5:21 PM

## 2023-03-08 NOTE — Progress Notes (Signed)
Pt slept through the night. SBP elevated, other vitals stable. No c/o pain noted.

## 2023-03-08 NOTE — Care Management Important Message (Signed)
Important Message  Patient Details  Name: Miguel Marsh MRN: [id number] Date of Birth: [birth year]   Medicare Important Message Given:  N/A - LOS <3 / Initial given by admissions     [name] 03/08/2023, 3:29 PM

## 2023-03-09 ENCOUNTER — Inpatient Hospital Stay (HOSPITAL_COMMUNITY): Payer: 59

## 2023-03-09 DIAGNOSIS — I10 Essential (primary) hypertension: Secondary | ICD-10-CM | POA: Diagnosis not present

## 2023-03-09 DIAGNOSIS — J9621 Acute and chronic respiratory failure with hypoxia: Secondary | ICD-10-CM | POA: Diagnosis not present

## 2023-03-09 DIAGNOSIS — J441 Chronic obstructive pulmonary disease with (acute) exacerbation: Secondary | ICD-10-CM | POA: Diagnosis not present

## 2023-03-09 DIAGNOSIS — K219 Gastro-esophageal reflux disease without esophagitis: Secondary | ICD-10-CM | POA: Diagnosis not present

## 2023-03-09 LAB — CULTURE, BLOOD (ROUTINE X 2)
Special Requests: ADEQUATE
Special Requests: ADEQUATE

## 2023-03-09 MED ORDER — PREDNISONE 20 MG PO TABS: ORAL_TABLET | ORAL | 0 refills | Status: DC

## 2023-03-09 MED ORDER — DOXYCYCLINE HYCLATE 100 MG PO TABS: 100.0000 mg | ORAL_TABLET | Freq: Two times a day (BID) | ORAL | 0 refills | Status: AC

## 2023-03-09 MED ORDER — OMEPRAZOLE 40 MG PO CPDR: 40.0000 mg | DELAYED_RELEASE_CAPSULE | Freq: Every day | ORAL | 2 refills | Status: DC

## 2023-03-09 MED ORDER — FUROSEMIDE 40 MG PO TABS: 40.0000 mg | ORAL_TABLET | Freq: Every day | ORAL | 1 refills | Status: DC

## 2023-03-09 MED ORDER — DM-GUAIFENESIN ER 30-600 MG PO TB12: 1.0000 | ORAL_TABLET | Freq: Two times a day (BID) | ORAL | 0 refills | Status: AC

## 2023-03-09 NOTE — Progress Notes (Signed)
Nsg Discharge Note  Admit Date:  03/05/2023 Discharge date: 03/09/2023   ORVA CHRISTIE to be D/C'd Home per MD order.  AVS completed.  Patient/caregiver able to verbalize understanding.  Discharge Medication: Allergies as of 03/09/2023   No Known Allergies      Medication List     STOP taking these medications    cefdinir 300 MG capsule Commonly known as: OMNICEF   hydrochlorothiazide 25 MG tablet Commonly known as: HYDRODIURIL   oxyCODONE 15 MG immediate release tablet Commonly known as: ROXICODONE       TAKE these medications    albuterol 108 (90 Base) MCG/ACT inhaler Commonly known as: VENTOLIN HFA Inhale 2 puffs into the lungs every 6 (six) hours as needed for wheezing.   albuterol (2.5 MG/3ML) 0.083% nebulizer solution Commonly known as: PROVENTIL Inhale 2.5 mg into the lungs every 6 (six) hours as needed for wheezing or shortness of breath.   Breztri Aerosphere 160-9-4.8 MCG/ACT Aero Generic drug: Budeson-Glycopyrrol-Formoterol Inhale 2 puffs into the lungs 2 (two) times daily.   clonazePAM 1 MG tablet Commonly known as: KLONOPIN Take 1 mg by mouth 2 (two) times daily.   dextromethorphan-guaiFENesin 30-600 MG 12hr tablet Commonly known as: MUCINEX DM Take 1 tablet by mouth 2 (two) times daily for 12 days.   doxycycline 100 MG tablet Commonly known as: VIBRA-TABS Take 1 tablet (100 mg total) by mouth every 12 (twelve) hours for 4 days.   furosemide 40 MG tablet Commonly known as: LASIX Take 1 tablet (40 mg total) by mouth daily.   omeprazole 40 MG capsule Commonly known as: PRILOSEC Take 1 capsule (40 mg total) by mouth daily.   OXYGEN Inhale 3 L into the lungs continuous.   predniSONE 20 MG tablet Commonly known as: DELTASONE Take 3 tablets by mouth daily x 1 day; then 2 tablet by mouth daily x 2 days; then 1 tablet by mouth daily x 3 days; then half tablet by mouth daily x 3 days and stop prednisone.        Discharge  Assessment: Vitals:   03/09/23 0418 03/09/23 0854  BP: (!) 141/82   Pulse: 70   Resp: 18   Temp: 98.5 F (36.9 C)   SpO2: 100% 95%   Skin clean, dry and intact without evidence of skin break down, no evidence of skin tears noted. IV catheter discontinued intact. Site without signs and symptoms of complications - no redness or edema noted at insertion site, patient denies c/o pain - only slight tenderness at site.  Dressing with slight pressure applied.  D/c Instructions-Education: Discharge instructions given to patient/family with verbalized understanding. D/c education completed with patient/family including follow up instructions, medication list, d/c activities limitations if indicated, with other d/c instructions as indicated by MD - patient able to verbalize understanding, all questions fully answered. Patient instructed to return to ED, call 911, or call MD for any changes in condition.  Patient escorted via WC, and D/C home via private auto.  Laurena Spies, RN 03/09/2023 2:10 PM

## 2023-03-09 NOTE — Care Management Important Message (Signed)
Important Message  Patient Details  Name: Miguel Marsh MRN: [id number] Date of Birth: 07-16-1961   Medicare Important Message Given:  Yes     [name] 03/09/2023, 9:45 AM

## 2023-03-09 NOTE — Discharge Summary (Signed)
Physician Discharge Summary   Patient: Miguel Marsh MRN: [id number] DOB: 1961-04-14  Admit date:     03/05/2023  Discharge date: 03/09/23  Discharge Physician: Vassie Loll   PCP: [name], NP   Recommendations at discharge:  Reassess blood pressure and adjust antihypertensive treatment as needed Repeat basic metabolic panel to follow ultralights and renal function Continue to assist with tobacco cessation. Make sure patient follow-up with pulmonology service to further adjust COPD maintenance management. -Assist patient requiring electric scooter for long distances ambulation due to underlying chronic respiratory failure (if he qualified for it).   Discharge Diagnoses: Principal Problem:   COPD with acute exacerbation (HCC) Active Problems:   Essential hypertension   GERD (gastroesophageal reflux disease)   Acute on chronic respiratory failure with hypoxia and hypercapnia (HCC)   Elevated brain natriuretic peptide (BNP) level   Elevated MCV  Brief Hospital admission course:  62 y.o. male with medical history significant of hypertension, COPD, chronic respiratory failure on 3 LPM of oxygen at baseline, right lung cancer admitted on 03/06/2023 with acute on chronic hypoxic respiratory failure secondary to COPD exacerbation   Assessment and Plan: 1) acute COPD exacerbation--no definite pneumonia appreciated o x-ray; but bronchiectasis component very highly differential. -Continue oral doxycycline to complete antibiotic therapy for bronchiectasis component.. -Continue mucolytic's, steroids, bronchodilators and the use of flutter valve. -Continue oxygen supplementation and follow clinical response. -Outpatient follow-up with pulmonology service recommended. -Case discussed with TOC for possible electric scooter to facilitate long distances of ambulation; patient will need to follow-up with his PCP after discharge.   2)HFpEF-diastolic dysfunction CHF -Patient presenting  with worsening hypoxia, elevated BNP and chest x-ray suggestive of pulmonary venous congestion -Echo from 03/06/2023 with EF of 60 to 65% -Prior echo from 2019 was consistent with EF of 65 to 60% and grade 1 diastolic dysfunction -Continue treatment with Lasix 40 mg daily. -Continue to follow low-sodium diet, daily weights and adequate hydration.   3)GERD--continue Protonix especially while on steroids -Lifestyle changes discussed with patient.   4) social/ethics/tobacco abuse--- patient admits to very frequent cocaine and ongoing tobacco use. -Cessation counseling provided. -Not interested in drug rehab program for now. -Patient is a DNR/DNI -Continue supportive and assistance as needed in the outpatient setting. -Patient reports that he can get nicotine patches over-the-counter.   5) acute on chronic hypoxic respiratory failure--due to #1 #2 treatment as mentioned above. -At baseline patient uses 2-3 L of oxygen via nasal cannula -No longer requiring BiPAP; while at rest 2-3 L nasal cannula supplementation in place. -Patient is stable for discharge at the moment.  Consultants: None Procedures performed: See below for x-ray reports. Disposition: Home Diet recommendation: Heart healthy/low-sodium diet.  DISCHARGE MEDICATION: Allergies as of 03/09/2023   No Known Allergies      Medication List     STOP taking these medications    cefdinir 300 MG capsule Commonly known as: OMNICEF   hydrochlorothiazide 25 MG tablet Commonly known as: HYDRODIURIL   oxyCODONE 15 MG immediate release tablet Commonly known as: ROXICODONE       TAKE these medications    albuterol 108 (90 Base) MCG/ACT inhaler Commonly known as: VENTOLIN HFA Inhale 2 puffs into the lungs every 6 (six) hours as needed for wheezing.   albuterol (2.5 MG/3ML) 0.083% nebulizer solution Commonly known as: PROVENTIL Inhale 2.5 mg into the lungs every 6 (six) hours as needed for wheezing or shortness of  breath.   Breztri Aerosphere 160-9-4.8 MCG/ACT Aero Generic drug: Budeson-Glycopyrrol-Formoterol  Inhale 2 puffs into the lungs 2 (two) times daily.   clonazePAM 1 MG tablet Commonly known as: KLONOPIN Take 1 mg by mouth 2 (two) times daily.   dextromethorphan-guaiFENesin 30-600 MG 12hr tablet Commonly known as: MUCINEX DM Take 1 tablet by mouth 2 (two) times daily for 12 days.   doxycycline 100 MG tablet Commonly known as: VIBRA-TABS Take 1 tablet (100 mg total) by mouth every 12 (twelve) hours for 4 days.   furosemide 40 MG tablet Commonly known as: LASIX Take 1 tablet (40 mg total) by mouth daily.   omeprazole 40 MG capsule Commonly known as: PRILOSEC Take 1 capsule (40 mg total) by mouth daily.   OXYGEN Inhale 3 L into the lungs continuous.   predniSONE 20 MG tablet Commonly known as: DELTASONE Take 3 tablets by mouth daily x 1 day; then 2 tablet by mouth daily x 2 days; then 1 tablet by mouth daily x 3 days; then half tablet by mouth daily x 3 days and stop prednisone.        Follow-up Information     [name], NP. Schedule an appointment as soon as possible for a visit in 10 day(s).   Specialty: Internal Medicine Contact information: [address] [place] [address] [phone]                Discharge Exam: Ceasar Mons Weights   03/06/23 0519 03/07/23 0440 03/07/23 1146  Weight: 74.4 kg 74.5 kg 73 kg   General exam: Alert, awake, oriented x 3; in no major distress; no overnight events.  Patient's respiratory status essentially back to baseline at discharge. Respiratory system: No frank crackles or expiratory wheezing appreciated ; Improved from movement bilaterally.  Good saturation on chronic supplementation. Cardiovascular system:RRR. No rubs or gallops; no JVD. Gastrointestinal system: Abdomen is nondistended, soft and nontender. No organomegaly or masses felt. Normal bowel sounds heard. Central nervous system: Alert and oriented. No  focal neurological deficits. Extremities: No cyanosis, clubbing or edema. Skin: No petechiae. Psychiatry: Mood & affect appropriate.    Condition at discharge: Stable and improved.  The results of significant diagnostics from this hospitalization (including imaging, microbiology, ancillary and laboratory) are listed below for reference.   Imaging Studies: ECHOCARDIOGRAM COMPLETE  Result Date: 03/06/2023    ECHOCARDIOGRAM REPORT   Patient Name:   SHAHMEER VENEZIALE Date of Exam: 03/06/2023 Medical Rec #:  [id number]        Height:       66.0 in Accession #:    [id number]       Weight:       164.0 lb Date of Birth:  07/28/1961        BSA:          1.838 m Patient Age:    61 years         BP:           124/52 mmHg Patient Gender: M                HR:           85 bpm. [facility] Procedure: 2D Echo, Cardiac Doppler and Color Doppler Indications:    CHF-Acute Diastolic I50.31  History:        Patient has no prior history of Echocardiogram examinations.                 Signs/Symptoms:Altered Mental Status; Risk Factors:Hypertension  and Former Smoker.  Sonographer:    Aron Baba Referring Phys: [id number] OLADAPO ADEFESO  Sonographer Comments: Image acquisition challenging due to uncooperative patient and Image acquisition challenging due to COPD. IMPRESSIONS  1. Left ventricular ejection fraction, by estimation, is 60 to 65%. The left ventricle has normal function. Left ventricular endocardial border not optimally defined to evaluate regional wall motion. Left ventricular diastolic parameters were normal.  2. Right ventricular systolic function is normal. The right ventricular size is normal.  3. The mitral valve is normal in structure. Trivial mitral valve regurgitation. No evidence of mitral stenosis.  4. The tricuspid valve is abnormal.  5. The aortic valve was not well visualized. Aortic valve regurgitation is not visualized.  6. Technically difficult study due to altered mental  status, noncooperation with study. FINDINGS  Left Ventricle: Left ventricular ejection fraction, by estimation, is 60 to 65%. The left ventricle has normal function. Left ventricular endocardial border not optimally defined to evaluate regional wall motion. The left ventricular internal cavity size was normal in size. There is no left ventricular hypertrophy. Left ventricular diastolic parameters were normal. Right Ventricle: The right ventricular size is normal. Right vetricular wall thickness was not well visualized. Right ventricular systolic function is normal. Left Atrium: Left atrial size was not well visualized. Right Atrium: Right atrial size was not well visualized. Pericardium: There is no evidence of pericardial effusion. Mitral Valve: The mitral valve is normal in structure. Trivial mitral valve regurgitation. No evidence of mitral valve stenosis. Tricuspid Valve: The tricuspid valve is abnormal. Tricuspid valve regurgitation is mild . No evidence of tricuspid stenosis. Aortic Valve: The aortic valve was not well visualized. Aortic valve regurgitation is not visualized. Aortic valve mean gradient measures 4.0 mmHg. Aortic valve peak gradient measures 9.7 mmHg. Aortic valve area, by VTI measures 3.24 cm. Pulmonic Valve: The pulmonic valve was not well visualized. Pulmonic valve regurgitation is not visualized. No evidence of pulmonic stenosis. Aorta: The aortic root is normal in size and structure. IAS/Shunts: The interatrial septum was not well visualized.  LEFT VENTRICLE PLAX 2D LVIDd:         4.40 cm   Diastology LVIDs:         2.70 cm   LV e' medial:    10.10 cm/s LV PW:         1.10 cm   LV E/e' medial:  11.2 LV IVS:        1.00 cm   LV e' lateral:   15.50 cm/s LVOT diam:     2.10 cm   LV E/e' lateral: 7.3 LV SV:         77 LV SV Index:   42 LVOT Area:     3.46 cm  RIGHT VENTRICLE RV S prime:     15.10 cm/s TAPSE (M-mode): 2.4 cm LEFT ATRIUM         Index LA diam:    3.00 cm 1.63 cm/m  AORTIC  VALVE AV Area (Vmax):    3.12 cm AV Area (Vmean):   2.98 cm AV Area (VTI):     3.24 cm AV Vmax:           156.10 cm/s AV Vmean:          89.049 cm/s AV VTI:            0.238 m AV Peak Grad:      9.7 mmHg AV Mean Grad:      4.0 mmHg LVOT Vmax:  140.67 cm/s LVOT Vmean:        76.585 cm/s LVOT VTI:          0.222 m LVOT/AV VTI ratio: 0.94  AORTA Ao Root diam: 3.40 cm MITRAL VALVE                TRICUSPID VALVE MV Area (PHT): 4.71 cm     TR Peak grad:   28.5 mmHg MV Decel Time: 161 msec     TR Vmax:        267.00 cm/s MV E velocity: 113.00 cm/s MV A velocity: 90.20 cm/s   SHUNTS MV E/A ratio:  1.25         Systemic VTI:  0.22 m                             Systemic Diam: 2.10 cm Dina Rich MD Electronically signed by Dina Rich MD Signature Date/Time: 03/06/2023/12:24:44 PM    Final    DG Chest Port 1 View  Result Date: 03/05/2023 CLINICAL DATA:  Shortness of breath. EXAM: PORTABLE CHEST 1 VIEW COMPARISON:  February 13, 2023 and February 06, 2023 FINDINGS: The heart size and mediastinal contours are within normal limits. Mild, diffusely increased interstitial lung markings are noted. This is very mildly increased in severity when compared to the prior exams. There is no evidence of focal consolidation, pleural effusion or pneumothorax. The visualized skeletal structures are unremarkable. IMPRESSION: Very mildly increased interstitial lung markings which may represent mild interstitial edema. Electronically Signed   By: Aram Candela M.D.   On: 03/05/2023 23:09   DG Chest 2 View  Result Date: 02/13/2023 CLINICAL DATA:  Shortness of breath EXAM: CHEST - 2 VIEW COMPARISON:  02/06/2023 FINDINGS: Patchy right lower lobe opacity, new, suspicious for pneumonia. Mild left basilar scarring/atelectasis. No pleural effusion or pneumothorax. Heart is normal in size. IMPRESSION: Patchy right lower lobe opacity, suspicious for pneumonia. Electronically Signed   By: Charline Bills M.D.   On: 02/13/2023 23:33     Microbiology: Results for orders placed or performed during the hospital encounter of 03/05/23  Resp panel by RT-PCR (RSV, Flu A&B, Covid) Anterior Nasal Swab     Status: None   Collection Time: 03/05/23 10:40 PM   Specimen: Anterior Nasal Swab  Result Value Ref Range Status   SARS Coronavirus 2 by RT PCR NEGATIVE NEGATIVE Final    Comment: (NOTE) SARS-CoV-2 target nucleic acids are NOT DETECTED.  The SARS-CoV-2 RNA is generally detectable in upper respiratory specimens during the acute phase of infection. The lowest concentration of SARS-CoV-2 viral copies this assay can detect is 138 copies/mL. A negative result does not preclude SARS-Cov-2 infection and should not be used as the sole basis for treatment or other patient management decisions. A negative result may occur with  improper specimen collection/handling, submission of specimen other than nasopharyngeal swab, presence of viral mutation(s) within the areas targeted by this assay, and inadequate number of viral copies(<138 copies/mL). A negative result must be combined with clinical observations, patient history, and epidemiological information. The expected result is Negative.  Fact Sheet for Patients:  BloggerCourse.com  Fact Sheet for Healthcare Providers:  SeriousBroker.it  This test is no t yet approved or cleared by the [place] FDA and  has been authorized for detection and/or diagnosis of SARS-CoV-2 by FDA under an Emergency Use Authorization (EUA). This EUA will remain  in effect (meaning this test can be used) for  the duration of the COVID-19 declaration under Section 564(b)(1) of the Act, 21 U.S.C.section 360bbb-3(b)(1), unless the authorization is terminated  or revoked sooner.       Influenza A by PCR NEGATIVE NEGATIVE Final   Influenza B by PCR NEGATIVE NEGATIVE Final    Comment: (NOTE) The Xpert Xpress SARS-CoV-2/FLU/RSV plus assay is intended  as an aid in the diagnosis of influenza from Nasopharyngeal swab specimens and should not be used as a sole basis for treatment. Nasal washings and aspirates are unacceptable for Xpert Xpress SARS-CoV-2/FLU/RSV testing.  Fact Sheet for Patients: BloggerCourse.com  Fact Sheet for Healthcare Providers: SeriousBroker.it  This test is not yet approved or cleared by the [place] FDA and has been authorized for detection and/or diagnosis of SARS-CoV-2 by FDA under an Emergency Use Authorization (EUA). This EUA will remain in effect (meaning this test can be used) for the duration of the COVID-19 declaration under Section 564(b)(1) of the Act, 21 U.S.C. section 360bbb-3(b)(1), unless the authorization is terminated or revoked.     Resp Syncytial Virus by PCR NEGATIVE NEGATIVE Final    Comment: (NOTE) Fact Sheet for Patients: BloggerCourse.com  Fact Sheet for Healthcare Providers: SeriousBroker.it  This test is not yet approved or cleared by the [place] FDA and has been authorized for detection and/or diagnosis of SARS-CoV-2 by FDA under an Emergency Use Authorization (EUA). This EUA will remain in effect (meaning this test can be used) for the duration of the COVID-19 declaration under Section 564(b)(1) of the Act, 21 U.S.C. section 360bbb-3(b)(1), unless the authorization is terminated or revoked.  Performed at [facility], [address], [place], [place] [address]   Blood culture (routine x 2)     Status: None (Preliminary result)   Collection Time: 03/05/23 10:52 PM   Specimen: BLOOD  Result Value Ref Range Status   Specimen Description BLOOD BLOOD RIGHT ARM  Final   Special Requests   Final    BOTTLES DRAWN AEROBIC AND ANAEROBIC Blood Culture adequate volume   Culture   Final    NO GROWTH 4 DAYS Performed at [facility], [address] [place], [place]  [address]    Report Status PENDING  Incomplete  Blood culture (routine x 2)     Status: None (Preliminary result)   Collection Time: 03/05/23 11:36 PM   Specimen: BLOOD  Result Value Ref Range Status   Specimen Description BLOOD BLOOD LEFT WRIST  Final   Special Requests   Final    BOTTLES DRAWN AEROBIC AND ANAEROBIC Blood Culture adequate volume   Culture   Final    NO GROWTH 4 DAYS Performed at [facility], [address] [place], [place] [address]    Report Status PENDING  Incomplete  MRSA Next Gen by PCR, Nasal     Status: None   Collection Time: 03/06/23  8:30 AM   Specimen: Nasal Mucosa; Nasal Swab  Result Value Ref Range Status   MRSA by PCR Next Gen NOT DETECTED NOT DETECTED Final    Comment: (NOTE) The GeneXpert MRSA Assay (FDA approved for NASAL specimens only), is one component of a comprehensive MRSA colonization surveillance program. It is not intended to diagnose MRSA infection nor to guide or monitor treatment for MRSA infections. Test performance is not FDA approved in patients less than 34 years old. Performed at [facility], [address], [place], [place] [address]     Labs: CBC: Recent Labs  Lab 03/05/23 2326 03/06/23 0528  WBC 5.2 5.1  NEUTROABS 3.9  --   HGB 12.1* 11.9*  HCT 43.1 43.3  MCV 107.5* 109.6*  PLT 252 244   Basic Metabolic Panel: Recent Labs  Lab 03/05/23 2326 03/06/23 0155 03/07/23 0439  NA 141  --  137  K 4.2  --  4.3  CL 88*  --  86*  CO2 >45*  --  >45*  GLUCOSE 188*  --  112*  BUN 9  --  14  CREATININE 0.49*  --  0.58*  CALCIUM 8.6*  --  8.4*  MG 2.1  --   --   PHOS  --  4.1  --    Liver Function Tests: Recent Labs  Lab 03/05/23 2326 03/07/23 0439  AST 14* 13*  ALT 14 14  ALKPHOS 69 63  BILITOT 0.3 0.5  PROT 6.7 6.1*  ALBUMIN 3.5 3.1*   CBG: No results for input(s): "GLUCAP" in the last 168 hours.  Discharge time spent: greater than 30 minutes.  Signed: Vassie Loll, MD [facility] 03/09/2023

## 2023-03-09 NOTE — TOC Progression Note (Signed)
Transition of Care [facility]) - Progression Note    Patient Details  Name: Miguel Marsh MRN: [id number] Date of Birth: 24-Oct-1961  Transition of [facility]) CM/SW Contact  Karn [place], [place] Phone Number: 03/09/2023, 9:59 AM  Clinical Narrative: Pt requesting electric scooter. LCSW shared with pt that he would need to follow up with PCP for this. Pt agreeable.       Expected Discharge Plan: Home/Self Care Barriers to Discharge: Continued Medical Work up  Expected Discharge Plan and Services In-house Referral: Clinical Social Work   Post Acute Care Choice: Resumption of Svcs/PTA Provider Living arrangements for the past 2 months: Apartment                                       Social Determinants of Health (SDOH) Interventions SDOH Screenings   Tobacco Use: Medium Risk (02/13/2023)    Readmission Risk Interventions    03/07/2023    9:19 AM  Readmission Risk Prevention Plan  Transportation Screening Complete  HRI or Home Care Consult Complete  Social Work Consult for Recovery Care Planning/Counseling Complete  Palliative Care Screening Not Applicable  Medication Review Ocean[facility]) Complete

## 2023-05-06 ENCOUNTER — Emergency Department (HOSPITAL_COMMUNITY)
Admission: EM | Admit: 2023-05-06 | Discharge: 2023-05-06 | Disposition: A | Discharge status: Home / Self Care | Payer: 59 | Attending: Emergency Medicine | Admitting: Emergency Medicine

## 2023-05-06 ENCOUNTER — Emergency Department (HOSPITAL_COMMUNITY): Payer: 59

## 2023-05-06 ENCOUNTER — Encounter (HOSPITAL_COMMUNITY): Payer: Self-pay | Admitting: Emergency Medicine

## 2023-05-06 ENCOUNTER — Other Ambulatory Visit: Payer: Self-pay

## 2023-05-06 DIAGNOSIS — J441 Chronic obstructive pulmonary disease with (acute) exacerbation: Secondary | ICD-10-CM | POA: Diagnosis not present

## 2023-05-06 DIAGNOSIS — R0602 Shortness of breath: Secondary | ICD-10-CM | POA: Diagnosis present

## 2023-05-06 DIAGNOSIS — Z9981 Dependence on supplemental oxygen: Secondary | ICD-10-CM | POA: Diagnosis not present

## 2023-05-06 LAB — CBC WITH DIFFERENTIAL/PLATELET
Abs Immature Granulocytes: 0.03 10*3/uL (ref 0.00–0.07)
Basophils Absolute: 0 10*3/uL (ref 0.0–0.1)
Basophils Relative: 0 %
Eosinophils Absolute: 0.1 10*3/uL (ref 0.0–0.5)
Eosinophils Relative: 1 %
HCT: 43.5 % (ref 39.0–52.0)
Hemoglobin: 13.5 g/dL (ref 13.0–17.0)
Immature Granulocytes: 0 %
Lymphocytes Relative: 14 %
Lymphs Abs: 1.8 10*3/uL (ref 0.7–4.0)
MCH: 29.7 pg (ref 26.0–34.0)
MCHC: 31 g/dL (ref 30.0–36.0)
MCV: 95.8 fL (ref 80.0–100.0)
Monocytes Absolute: 1.7 10*3/uL — ABNORMAL HIGH (ref 0.1–1.0)
Monocytes Relative: 14 %
Neutro Abs: 8.9 10*3/uL — ABNORMAL HIGH (ref 1.7–7.7)
Neutrophils Relative %: 71 %
Platelets: 183 10*3/uL (ref 150–400)
RBC: 4.54 MIL/uL (ref 4.22–5.81)
RDW: 12.2 % (ref 11.5–15.5)
WBC: 12.5 10*3/uL — ABNORMAL HIGH (ref 4.0–10.5)
nRBC: 0 % (ref 0.0–0.2)

## 2023-05-06 LAB — COMPREHENSIVE METABOLIC PANEL
ALT: 11 U/L (ref 0–44)
AST: 12 U/L — ABNORMAL LOW (ref 15–41)
Albumin: 3.5 g/dL (ref 3.5–5.0)
Alkaline Phosphatase: 60 U/L (ref 38–126)
Anion gap: 8 (ref 5–15)
BUN: 8 mg/dL (ref 8–23)
CO2: 35 mmol/L — ABNORMAL HIGH (ref 22–32)
Calcium: 9 mg/dL (ref 8.9–10.3)
Chloride: 96 mmol/L — ABNORMAL LOW (ref 98–111)
Creatinine, Ser: 0.63 mg/dL (ref 0.61–1.24)
GFR, Estimated: >60 mL/min (ref >60)
Glucose, Bld: 110 mg/dL — ABNORMAL HIGH (ref 70–99)
Potassium: 3.8 mmol/L (ref 3.5–5.1)
Sodium: 139 mmol/L (ref 135–145)
Total Bilirubin: 0.7 mg/dL (ref 0.3–1.2)
Total Protein: 6.6 g/dL (ref 6.5–8.1)

## 2023-05-06 MED ORDER — PREDNISONE 20 MG PO TABS: ORAL_TABLET | ORAL | 0 refills | Status: DC

## 2023-05-06 MED ORDER — IPRATROPIUM-ALBUTEROL 0.5-2.5 (3) MG/3ML IN SOLN
3.0000 mL | RESPIRATORY_TRACT | Status: AC
  Administered 2023-05-06 (×3): 3 mL via RESPIRATORY_TRACT
  Filled 2023-05-06: qty 3
  Filled 2023-05-06: qty 6

## 2023-05-06 MED ORDER — DOXYCYCLINE HYCLATE 100 MG PO TABS
100.0000 mg | ORAL_TABLET | Freq: Once | ORAL | Status: AC
  Administered 2023-05-06: 100 mg via ORAL
  Filled 2023-05-06: qty 1

## 2023-05-06 MED ORDER — ALBUTEROL SULFATE HFA 108 (90 BASE) MCG/ACT IN AERS
2.0000 | INHALATION_SPRAY | Freq: Once | RESPIRATORY_TRACT | Status: AC
  Administered 2023-05-06: 2 via RESPIRATORY_TRACT
  Filled 2023-05-06: qty 6.7

## 2023-05-06 MED ORDER — DOXYCYCLINE HYCLATE 100 MG PO CAPS: 100.0000 mg | ORAL_CAPSULE | Freq: Two times a day (BID) | ORAL | 0 refills | Status: DC

## 2023-05-06 NOTE — ED Provider Notes (Signed)
EMERGENCY DEPARTMENT AT [facility] Provider Note   CSN: [id number] Arrival date & time: 05/06/23  0040     History  Chief Complaint  Patient presents with   Shortness of Breath    Miguel Marsh is a 62 y.o. male.  62 year old male with history of COPD often admitted to hospital for the same who presents ER today with EMS secondary to cough and shortness of breath.  States has been going on for a while but got significantly worse today and tonight.  Received albuterol and steroids with EMS.  Oxygen still low on arrival here does not wear oxygen at home.  No fevers.  No trauma.  No other associated symptoms.   Shortness of Breath      Home Medications Prior to Admission medications   Medication Sig Start Date End Date Taking? Authorizing Provider  doxycycline (VIBRAMYCIN) 100 MG capsule Take 1 capsule (100 mg total) by mouth 2 (two) times daily. One po bid x 7 days 05/06/23  Yes Windy Dudek, Barbara Cower, MD  predniSONE (DELTASONE) 20 MG tablet 2 tabs po daily x 4 days 05/06/23  Yes Natthew Marlatt, Barbara Cower, MD  albuterol (PROVENTIL) (2.5 MG/3ML) 0.083% nebulizer solution Inhale 2.5 mg into the lungs every 6 (six) hours as needed for wheezing or shortness of breath.  01/19/20   [provider]  BREZTRI AEROSPHERE 160-9-4.8 MCG/ACT AERO Inhale 2 puffs into the lungs 2 (two) times daily. 05/17/22   [provider]  clonazePAM (KLONOPIN) 1 MG tablet Take 1 mg by mouth 2 (two) times daily. 05/18/22   [provider]  furosemide (LASIX) 40 MG tablet Take 1 tablet (40 mg total) by mouth daily. 03/09/23   Vassie Loll, MD  omeprazole (PRILOSEC) 40 MG capsule Take 1 capsule (40 mg total) by mouth daily. 03/09/23   Vassie Loll, MD  OXYGEN Inhale 3 L into the lungs continuous.     [provider]      Allergies    Ibuprofen    Review of Systems   Review of Systems  Respiratory:  Positive for shortness of breath.     Physical Exam Updated Vital  Signs BP 124/61   Pulse 91   Temp 98.6 F (37 C) (Oral)   Resp 17   Ht 5\' 6"  (1.676 m)   Wt 72.6 kg   SpO2 91%   BMI 25.82 kg/m  Physical Exam Vitals and nursing note reviewed.  Constitutional:      Appearance: He is well-developed.  HENT:     Head: Normocephalic and atraumatic.  Cardiovascular:     Rate and Rhythm: Normal rate.  Pulmonary:     Effort: Pulmonary effort is normal. Tachypnea present. No respiratory distress.     Breath sounds: Decreased breath sounds present.     Comments: Pursed lip breathing Chest:     Chest wall: No mass.  Abdominal:     General: There is no distension.  Musculoskeletal:        General: Normal range of motion.     Cervical back: Normal range of motion.     Right lower leg: Edema present.     Left lower leg: Edema present.  Skin:    General: Skin is warm and dry.     Coloration: Skin is not cyanotic or pale.  Neurological:     General: No focal deficit present.     Mental Status: He is alert.     ED Results / Procedures / Treatments  Labs (all labs ordered are listed, but only abnormal results are displayed) Labs Reviewed  CBC WITH DIFFERENTIAL/PLATELET - Abnormal; Notable for the following components:      Result Value   WBC 12.5 (*)    Neutro Abs 8.9 (*)    Monocytes Absolute 1.7 (*)    All other components within normal limits  COMPREHENSIVE METABOLIC PANEL - Abnormal; Notable for the following components:   Chloride 96 (*)    CO2 35 (*)    Glucose, Bld 110 (*)    AST 12 (*)    All other components within normal limits    EKG None  Radiology DG Chest Portable 1 View  Result Date: 05/06/2023 CLINICAL DATA:  Cough, shortness of breath EXAM: PORTABLE CHEST 1 VIEW COMPARISON:  03/05/2023 FINDINGS: Heart and mediastinal contours within normal limits. Patchy opacity in the right lower lobe. Left lung clear. No effusions. No acute bony abnormality. IMPRESSION: Patchy opacity in the right lower lobe concerning for  pneumonia. Electronically Signed   By: Charlett Nose M.D.   On: 05/06/2023 01:35    Procedures Procedures    Medications Ordered in ED Medications  albuterol (VENTOLIN HFA) 108 (90 Base) MCG/ACT inhaler 2 puff (has no administration in time range)  ipratropium-albuterol (DUONEB) 0.5-2.5 (3) MG/3ML nebulizer solution 3 mL (3 mLs Nebulization Given 05/06/23 0106)  doxycycline (VIBRA-TABS) tablet 100 mg (100 mg Oral Given 05/06/23 0147)    ED Course/ Medical Decision Making/ A&P                             Medical Decision Making Amount and/or Complexity of Data Reviewed Labs: ordered. Radiology: ordered. ECG/medicine tests: ordered.  Risk Prescription drug management.  Likely COPD exacerbation.  Will workup and treat for same. On my interpretation of the chest x-ray it does appear that he has a right lower lobe pneumonia.  Antibiotics initiated.  Breathing treatments given.  Steroids given by EMS.  Patient was observed for couple hours.  His oxygen stabilized on his home 3 L and he was persistently 90-92 on that even at rest.  Patient was able to ambulate with sats around 92 as well and no tachypnea.  Feel patient stable for discharge.  I discussed this with him he requested to be admitted until it clears up however I do not see a medical indication for admission at this time as his vital signs are stable on his home oxygen he is not septic. Will return for new or worsening symptoms.    Final Clinical Impression(s) / ED Diagnoses Final diagnoses:  COPD exacerbation (HCC)    Rx / DC Orders ED Discharge Orders          Ordered    doxycycline (VIBRAMYCIN) 100 MG capsule  2 times daily        05/06/23 0525    predniSONE (DELTASONE) 20 MG tablet        05/06/23 0525              Izea Livolsi, Barbara Cower, MD 05/06/23 [phone]

## 2023-05-06 NOTE — ED Triage Notes (Signed)
Pt BIB [facility] from home for SOB x 2 hours, hx of COPD on 3L at baseline, pt reports using inhaler with no relief, v/s upon arrival 88%, HR 124, 149/91, CBG 118, pt given 2 neb treatments and Solumedrol en route with relief, sats up to 100%

## 2023-05-06 NOTE — ED Notes (Signed)
Respiratory at bedside.

## 2023-05-06 NOTE — ED Notes (Addendum)
Pt ambulated in hall on 3L [place] as ordered at home, no assistance needed, no difficulty noted or reported, O2 sat 92% while ambulating

## 2023-06-24 ENCOUNTER — Encounter (HOSPITAL_COMMUNITY): Payer: Self-pay

## 2023-06-24 ENCOUNTER — Other Ambulatory Visit: Payer: Self-pay

## 2023-06-24 ENCOUNTER — Inpatient Hospital Stay (HOSPITAL_COMMUNITY)
Admission: EM | Admit: 2023-06-24 | Discharge: 2023-07-01 | DRG: 177 | Disposition: A | Discharge status: Home / Self Care | Payer: 59 | Attending: Family Medicine | Admitting: Family Medicine

## 2023-06-24 ENCOUNTER — Emergency Department (HOSPITAL_COMMUNITY): Payer: 59

## 2023-06-24 DIAGNOSIS — J449 Chronic obstructive pulmonary disease, unspecified: Secondary | ICD-10-CM | POA: Diagnosis present

## 2023-06-24 DIAGNOSIS — I5032 Chronic diastolic (congestive) heart failure: Secondary | ICD-10-CM | POA: Diagnosis present

## 2023-06-24 DIAGNOSIS — F17210 Nicotine dependence, cigarettes, uncomplicated: Secondary | ICD-10-CM | POA: Diagnosis present

## 2023-06-24 DIAGNOSIS — R911 Solitary pulmonary nodule: Secondary | ICD-10-CM | POA: Insufficient documentation

## 2023-06-24 DIAGNOSIS — Z9981 Dependence on supplemental oxygen: Secondary | ICD-10-CM | POA: Diagnosis not present

## 2023-06-24 DIAGNOSIS — D6959 Other secondary thrombocytopenia: Secondary | ICD-10-CM | POA: Diagnosis present

## 2023-06-24 DIAGNOSIS — F419 Anxiety disorder, unspecified: Secondary | ICD-10-CM | POA: Diagnosis present

## 2023-06-24 DIAGNOSIS — J9601 Acute respiratory failure with hypoxia: Principal | ICD-10-CM

## 2023-06-24 DIAGNOSIS — G893 Neoplasm related pain (acute) (chronic): Secondary | ICD-10-CM | POA: Diagnosis present

## 2023-06-24 DIAGNOSIS — Z66 Do not resuscitate: Secondary | ICD-10-CM | POA: Diagnosis present

## 2023-06-24 DIAGNOSIS — J9622 Acute and chronic respiratory failure with hypercapnia: Secondary | ICD-10-CM | POA: Diagnosis present

## 2023-06-24 DIAGNOSIS — R7302 Impaired glucose tolerance (oral): Secondary | ICD-10-CM | POA: Diagnosis present

## 2023-06-24 DIAGNOSIS — Z85118 Personal history of other malignant neoplasm of bronchus and lung: Secondary | ICD-10-CM

## 2023-06-24 DIAGNOSIS — Z888 Allergy status to other drugs, medicaments and biological substances status: Secondary | ICD-10-CM

## 2023-06-24 DIAGNOSIS — J441 Chronic obstructive pulmonary disease with (acute) exacerbation: Secondary | ICD-10-CM | POA: Diagnosis present

## 2023-06-24 DIAGNOSIS — Z79899 Other long term (current) drug therapy: Secondary | ICD-10-CM | POA: Diagnosis not present

## 2023-06-24 DIAGNOSIS — F17200 Nicotine dependence, unspecified, uncomplicated: Secondary | ICD-10-CM | POA: Diagnosis present

## 2023-06-24 DIAGNOSIS — Z716 Tobacco abuse counseling: Secondary | ICD-10-CM

## 2023-06-24 DIAGNOSIS — Z8616 Personal history of COVID-19: Secondary | ICD-10-CM

## 2023-06-24 DIAGNOSIS — U071 COVID-19: Secondary | ICD-10-CM | POA: Diagnosis present

## 2023-06-24 DIAGNOSIS — J9621 Acute and chronic respiratory failure with hypoxia: Secondary | ICD-10-CM | POA: Diagnosis present

## 2023-06-24 DIAGNOSIS — Y92239 Unspecified place in hospital as the place of occurrence of the external cause: Secondary | ICD-10-CM | POA: Diagnosis present

## 2023-06-24 DIAGNOSIS — T380X5A Adverse effect of glucocorticoids and synthetic analogues, initial encounter: Secondary | ICD-10-CM | POA: Diagnosis present

## 2023-06-24 DIAGNOSIS — J1282 Pneumonia due to coronavirus disease 2019: Secondary | ICD-10-CM | POA: Diagnosis present

## 2023-06-24 DIAGNOSIS — R739 Hyperglycemia, unspecified: Secondary | ICD-10-CM | POA: Diagnosis present

## 2023-06-24 DIAGNOSIS — J470 Bronchiectasis with acute lower respiratory infection: Secondary | ICD-10-CM | POA: Diagnosis present

## 2023-06-24 DIAGNOSIS — I110 Hypertensive heart disease with heart failure: Secondary | ICD-10-CM | POA: Diagnosis present

## 2023-06-24 DIAGNOSIS — K219 Gastro-esophageal reflux disease without esophagitis: Secondary | ICD-10-CM | POA: Diagnosis present

## 2023-06-24 DIAGNOSIS — J440 Chronic obstructive pulmonary disease with acute lower respiratory infection: Secondary | ICD-10-CM | POA: Diagnosis present

## 2023-06-24 DIAGNOSIS — J189 Pneumonia, unspecified organism: Secondary | ICD-10-CM | POA: Insufficient documentation

## 2023-06-24 LAB — CBC WITH DIFFERENTIAL/PLATELET
Abs Immature Granulocytes: 0.01 10*3/uL (ref 0.00–0.07)
Basophils Absolute: 0 10*3/uL (ref 0.0–0.1)
Basophils Relative: 0 %
Eosinophils Absolute: 0 10*3/uL (ref 0.0–0.5)
Eosinophils Relative: 0 %
HCT: 42 % (ref 39.0–52.0)
Hemoglobin: 13 g/dL (ref 13.0–17.0)
Immature Granulocytes: 0 %
Lymphocytes Relative: 32 %
Lymphs Abs: 1.3 10*3/uL (ref 0.7–4.0)
MCH: 29.5 pg (ref 26.0–34.0)
MCHC: 31 g/dL (ref 30.0–36.0)
MCV: 95.2 fL (ref 80.0–100.0)
Monocytes Absolute: 0.8 10*3/uL (ref 0.1–1.0)
Monocytes Relative: 20 %
Neutro Abs: 1.9 10*3/uL (ref 1.7–7.7)
Neutrophils Relative %: 48 %
Platelets: 136 10*3/uL — ABNORMAL LOW (ref 150–400)
RBC: 4.41 MIL/uL (ref 4.22–5.81)
RDW: 11.8 % (ref 11.5–15.5)
WBC: 4 10*3/uL (ref 4.0–10.5)
nRBC: 0 % (ref 0.0–0.2)

## 2023-06-24 LAB — PROTIME-INR
INR: 0.9 (ref 0.8–1.2)
Prothrombin Time: 12.7 seconds (ref 11.4–15.2)

## 2023-06-24 LAB — BASIC METABOLIC PANEL
Anion gap: 4 — ABNORMAL LOW (ref 5–15)
BUN: 7 mg/dL — ABNORMAL LOW (ref 8–23)
CO2: 43 mmol/L — ABNORMAL HIGH (ref 22–32)
Calcium: 8.4 mg/dL — ABNORMAL LOW (ref 8.9–10.3)
Chloride: 91 mmol/L — ABNORMAL LOW (ref 98–111)
Creatinine, Ser: 0.5 mg/dL — ABNORMAL LOW (ref 0.61–1.24)
GFR, Estimated: >60 mL/min (ref >60)
Glucose, Bld: 137 mg/dL — ABNORMAL HIGH (ref 70–99)
Potassium: 3.7 mmol/L (ref 3.5–5.1)
Sodium: 138 mmol/L (ref 135–145)

## 2023-06-24 LAB — RESP PANEL BY RT-PCR (RSV, FLU A&B, COVID)  RVPGX2
Influenza A by PCR: NEGATIVE
Influenza B by PCR: NEGATIVE
Resp Syncytial Virus by PCR: NEGATIVE
SARS Coronavirus 2 by RT PCR: POSITIVE — AB

## 2023-06-24 LAB — LACTIC ACID, PLASMA
Lactic Acid, Venous: 0.7 mmol/L (ref 0.5–1.9)
Lactic Acid, Venous: 0.6 mmol/L (ref 0.5–1.9)

## 2023-06-24 LAB — APTT: aPTT: 27 seconds (ref 24–36)

## 2023-06-24 LAB — MAGNESIUM: Magnesium: 2 mg/dL (ref 1.7–2.4)

## 2023-06-24 MED ORDER — ALBUTEROL SULFATE (2.5 MG/3ML) 0.083% IN NEBU
10.0000 mg | INHALATION_SOLUTION | RESPIRATORY_TRACT | Status: AC
  Administered 2023-06-24: 10 mg via RESPIRATORY_TRACT
  Filled 2023-06-24: qty 12

## 2023-06-24 MED ORDER — ONDANSETRON HCL 4 MG PO TABS: 4.0000 mg | ORAL_TABLET | Freq: Four times a day (QID) | ORAL | Status: DC | PRN

## 2023-06-24 MED ORDER — SODIUM CHLORIDE 0.9 % IV SOLN
500.0000 mg | INTRAVENOUS | Status: DC
  Administered 2023-06-24: 500 mg via INTRAVENOUS
  Filled 2023-06-24: qty 5

## 2023-06-24 MED ORDER — ALBUTEROL (5 MG/ML) CONTINUOUS INHALATION SOLN
10.0000 mg | INHALATION_SOLUTION | RESPIRATORY_TRACT | Status: AC
  Administered 2023-06-24: 10 mg via RESPIRATORY_TRACT
  Filled 2023-06-24: qty 5

## 2023-06-24 MED ORDER — ACETAMINOPHEN 325 MG PO TABS: 650.0000 mg | ORAL_TABLET | Freq: Four times a day (QID) | ORAL | Status: DC | PRN

## 2023-06-24 MED ORDER — LACTATED RINGERS IV SOLN: INTRAVENOUS | Status: DC

## 2023-06-24 MED ORDER — ACETAMINOPHEN 650 MG RE SUPP: 650.0000 mg | Freq: Four times a day (QID) | RECTAL | Status: DC | PRN

## 2023-06-24 MED ORDER — ONDANSETRON HCL 4 MG/2ML IJ SOLN: 4.0000 mg | Freq: Four times a day (QID) | INTRAMUSCULAR | Status: DC | PRN

## 2023-06-24 MED ORDER — ENOXAPARIN SODIUM 40 MG/0.4ML IJ SOSY
40.0000 mg | PREFILLED_SYRINGE | INTRAMUSCULAR | Status: DC
  Administered 2023-06-25 – 2023-07-01 (×7): 40 mg via SUBCUTANEOUS
  Filled 2023-06-24 (×7): qty 0.4

## 2023-06-24 MED ORDER — DM-GUAIFENESIN ER 30-600 MG PO TB12
1.0000 | ORAL_TABLET | Freq: Two times a day (BID) | ORAL | Status: DC
  Administered 2023-06-25 – 2023-07-01 (×14): 1 via ORAL
  Filled 2023-06-24 (×14): qty 1

## 2023-06-24 MED ORDER — METHYLPREDNISOLONE SODIUM SUCC 125 MG IJ SOLR
125.0000 mg | Freq: Once | INTRAMUSCULAR | Status: AC
  Administered 2023-06-24: 125 mg via INTRAVENOUS
  Filled 2023-06-24: qty 2

## 2023-06-24 MED ORDER — ALBUTEROL SULFATE (2.5 MG/3ML) 0.083% IN NEBU
INHALATION_SOLUTION | RESPIRATORY_TRACT | Status: AC
  Filled 2023-06-24: qty 12

## 2023-06-24 MED ORDER — SODIUM CHLORIDE 0.9 % IV SOLN
2.0000 g | INTRAVENOUS | Status: DC
  Administered 2023-06-24: 2 g via INTRAVENOUS
  Filled 2023-06-24: qty 20

## 2023-06-24 NOTE — ED Triage Notes (Signed)
Pt called EMS with difficulty breathing. Had breathing treatments at home no relief. EMS gave 2 albuterol. At home he was 82% on O2.

## 2023-06-24 NOTE — ED Provider Notes (Signed)
[place] EMERGENCY DEPARTMENT AT [facility] Provider Note   CSN: [id number] Arrival date & time: 06/24/23  1191     History  Chief Complaint  Patient presents with   Shortness of Breath    Miguel Marsh is a 62 y.o. male.   Shortness of Breath  Patient is a critically ill-appearing 62 year old male with a known history of respiratory failure, the patient has a history of COPD, he has multiple admissions to the hospital in the past for COPD including in May, April, August of last year, he continues to smoke cigarettes, he has a history of metastatic lung cancer, he denies a history of diabetes.  He takes medications including albuterol and Breztri, he is on 3 L of oxygen chronically.  The call went out to paramedics because of shortness of breath, they found the patient to be severely symptomatic with oxygen levels less than 80%, they placed him on a nonrebreather, he had significant increased work of breathing, no edema of the legs, no fevers or chills, he has been coughing.    Home Medications Prior to Admission medications   Medication Sig Start Date End Date Taking? Authorizing Provider  albuterol (PROVENTIL) (2.5 MG/3ML) 0.083% nebulizer solution Inhale 2.5 mg into the lungs every 6 (six) hours as needed for wheezing or shortness of breath.  01/19/20  Yes [provider]  albuterol (VENTOLIN HFA) 108 (90 Base) MCG/ACT inhaler Inhale 2 puffs into the lungs every 6 (six) hours as needed. 06/18/23  Yes [provider]  BREZTRI AEROSPHERE 160-9-4.8 MCG/ACT AERO Inhale 2 puffs into the lungs 2 (two) times daily. 05/17/22  Yes [provider]  clonazePAM (KLONOPIN) 1 MG tablet Take 1 mg by mouth 2 (two) times daily. 05/18/22  Yes [provider]  furosemide (LASIX) 40 MG tablet Take 1 tablet (40 mg total) by mouth daily. Patient taking differently: Take 40 mg by mouth as needed for fluid or edema. 03/09/23  Yes Vassie Loll, MD  oxyCODONE  (ROXICODONE) 15 MG immediate release tablet Take 15 mg by mouth every 4 (four) hours as needed.   Yes [provider]  OXYGEN Inhale 3 L into the lungs continuous.    Yes [provider]      Allergies    Ibuprofen    Review of Systems   Review of Systems  Respiratory:  Positive for shortness of breath.   All other systems reviewed and are negative.   Physical Exam Updated Vital Signs BP 123/67   Pulse 95   Temp 99.4 F (37.4 C) (Oral)   Resp 15   SpO2 93%  Physical Exam Vitals and nursing note reviewed.  Constitutional:      General: He is in acute distress.     Appearance: He is ill-appearing.  HENT:     Head: Normocephalic and atraumatic.     Mouth/Throat:     Pharynx: No oropharyngeal exudate.  Eyes:     General: No scleral icterus.       Right eye: No discharge.        Left eye: No discharge.     Conjunctiva/sclera: Conjunctivae normal.     Pupils: Pupils are equal, round, and reactive to light.  Neck:     Thyroid: No thyromegaly.     Vascular: No JVD.  Cardiovascular:     Rate and Rhythm: Regular rhythm. Tachycardia present.     Heart sounds: Normal heart sounds. No murmur heard.    No friction rub.  No gallop.  Pulmonary:     Effort: Respiratory distress present.     Breath sounds: Wheezing, rhonchi and rales present.     Comments: Virtually no breath sounds in either lung field, severe prolonged expiratory phase, tachypneic, speaks in 1-2 word sentences Abdominal:     General: Bowel sounds are normal. There is no distension.     Palpations: Abdomen is soft. There is no mass.     Tenderness: There is no abdominal tenderness.  Musculoskeletal:        General: No tenderness. Normal range of motion.     Cervical back: Normal range of motion and neck supple.  Lymphadenopathy:     Cervical: No cervical adenopathy.  Skin:    General: Skin is warm and dry.     Findings: No erythema or rash.  Neurological:     Mental Status: He is alert.      Coordination: Coordination normal.  Psychiatric:        Behavior: Behavior normal.     ED Results / Procedures / Treatments   Labs (all labs ordered are listed, but only abnormal results are displayed) Labs Reviewed  RESP PANEL BY RT-PCR (RSV, FLU A&B, COVID)  RVPGX2 - Abnormal; Notable for the following components:      Result Value   SARS Coronavirus 2 by RT PCR POSITIVE (*)    All other components within normal limits  CBC WITH DIFFERENTIAL/PLATELET - Abnormal; Notable for the following components:   Platelets 136 (*)    All other components within normal limits  BASIC METABOLIC PANEL - Abnormal; Notable for the following components:   Chloride 91 (*)    CO2 43 (*)    Glucose, Bld 137 (*)    BUN 7 (*)    Creatinine, Ser 0.50 (*)    Calcium 8.4 (*)    Anion gap 4 (*)    All other components within normal limits  CULTURE, BLOOD (ROUTINE X 2)  CULTURE, BLOOD (ROUTINE X 2)  MAGNESIUM  LACTIC ACID, PLASMA  PROTIME-INR  APTT  LACTIC ACID, PLASMA    EKG EKG Interpretation Date/Time:  Sunday June 24 2023 19:09:29 EDT Ventricular Rate:  94 PR Interval:  128 QRS Duration:  79 QT Interval:  350 QTC Calculation: 438 R Axis:   -70  Text Interpretation: Sinus rhythm Consider right atrial enlargement LAD, consider left anterior fascicular block Confirmed by Eber Hong ([id number]) on 06/24/2023 7:10:25 PM  Radiology DG Chest Port 1 View  Result Date: 06/24/2023 CLINICAL DATA:  Shortness of breath. EXAM: PORTABLE CHEST 1 VIEW COMPARISON:  May 06, 2023 FINDINGS: The heart size and mediastinal contours are within normal limits. The lungs are hyperinflated. Mild, diffuse, chronic appearing increased interstitial lung markings are noted. Mild scarring and/or atelectasis is seen within bilateral lung bases. A 5 mm nodular opacity is also seen overlying the right lung base. No pleural effusion or pneumothorax is identified. Multilevel degenerative changes seen throughout the thoracic  spine. IMPRESSION: 1. Mild bibasilar scarring and/or atelectasis. 2. Findings which may represent a small right basilar lung nodule. Correlation with nonemergent chest CT is recommended. Electronically Signed   By: Aram Candela M.D.   On: 06/24/2023 20:21    Procedures .Critical Care  Performed by: Eber Hong, MD Authorized by: Eber Hong, MD   Critical care provider statement:    Critical care time (minutes):  45   Critical care time was exclusive of:  Separately billable procedures and treating other patients and teaching time  Critical care was necessary to treat or prevent imminent or life-threatening deterioration of the following conditions:  Respiratory failure   Critical care was time spent personally by me on the following activities:  Development of treatment plan with patient or surrogate, discussions with consultants, evaluation of patient's response to treatment, examination of patient, obtaining history from patient or surrogate, review of old charts, re-evaluation of patient's condition, pulse oximetry, ordering and review of radiographic studies, ordering and review of laboratory studies and ordering and performing treatments and interventions   I assumed direction of critical care for this patient from another provider in my specialty: no     Care discussed with: admitting provider   Comments:           Medications Ordered in ED Medications  albuterol (PROVENTIL,VENTOLIN) solution continuous neb (0 mg Nebulization Stopped 06/24/23 2018)  lactated ringers infusion ( Intravenous New Bag/Given 06/24/23 1953)  cefTRIAXone (ROCEPHIN) 2 g in sodium chloride 0.9 % 100 mL IVPB (0 g Intravenous Stopped 06/24/23 1954)  azithromycin (ZITHROMAX) 500 mg in sodium chloride 0.9 % 250 mL IVPB (500 mg Intravenous New Bag/Given 06/24/23 1953)  albuterol (PROVENTIL) (2.5 MG/3ML) 0.083% nebulizer solution (  Canceled Entry 06/24/23 1926)  methylPREDNISolone sodium succinate  (SOLU-MEDROL) 125 mg/2 mL injection 125 mg (125 mg Intravenous Given 06/24/23 1924)    ED Course/ Medical Decision Making/ A&P                                 Medical Decision Making Amount and/or Complexity of Data Reviewed Labs: ordered. Radiology: ordered. ECG/medicine tests: ordered.  Risk Prescription drug management. Decision regarding hospitalization.    This patient presents to the ED for concern of severe shortness of breath, this involves an extensive number of treatment options, and is a complaint that carries with it a high risk of complications and morbidity.  The differential diagnosis includes pneumothorax, acute on chronic respiratory failure, COPD exacerbation, hypoxic or hypercapnic respiratory failure, pneumonia, COVID-19   Co morbidities that complicate the patient evaluation  Severe COPD on 3 L of oxygen chronically   Additional history obtained:  Additional history obtained from electronic medical record External records from outside source obtained and reviewed including most prior admissions to the hospital, notes reviewed   Lab Tests:  I Ordered, and personally interpreted labs.  The pertinent results include: Blood cultures pending, COVID-positive, CBC without leukocytosis or anemia, metabolic panel without renal dysfunction or significant electrolyte abnormalities, magnesium is normal, lactic acid is normal,   Imaging Studies ordered:  I ordered imaging studies including chest x-ray I independently visualized and interpreted imaging which showed possible nodule, no signs of pneumonia, some atelectasis likely present I agree with the radiologist interpretation   Cardiac Monitoring: / EKG:  The patient was maintained on a cardiac monitor.  I personally viewed and interpreted the cardiac monitored which showed an underlying rhythm of: Sinus rhythm improved from sinus tachycardia on arrival   Consultations Obtained:  I requested consultation  with the hospitalist,  and discussed lab and imaging findings as well as pertinent plan - they recommend: Admission to higher level of care for this evening.  The patient is a full code will need to be intubated if things got worse but at this time he is doing well on a nonrebreather.   Problem List / ED Course / Critical interventions / Medication management  Given albuterol continuous nebulizer, steroids I ordered medication  including steroids and bronchodilators for respiratory distress, required high rate of oxygen by nonrebreather Reevaluation of the patient after these medicines showed that the patient improved but still critically ill I have reviewed the patients home medicines and have made adjustments as needed   Social Determinants of Health:  Chronic smoker   Test / Admission - Considered:  Admit high level of care, critically ill         Final Clinical Impression(s) / ED Diagnoses Final diagnoses:  Acute hypoxic respiratory failure (HCC)  COVID-19     Eber Hong, MD 06/24/23 2044

## 2023-06-24 NOTE — H&P (Addendum)
History and Physical    Patient: Miguel Marsh:[id number] DOB: [birth year] DOA: 06/24/2023 DOS: the patient was seen and examined on 08/10/2023 PCP: [name], NP  Patient coming from: Home  Chief Complaint:  Chief Complaint  Patient presents with   Shortness of Breath   HPI: Miguel Marsh is a 62 y.o. male with medical history significant of hypertension, COPD, chronic hypoxic respiratory failure on 3 LPM of oxygen at baseline, HFpEF, GERD history of metastatic NSCLC (follows with [facility]), tobacco abuse who presents to the emergency department from home via EMS due to worsening shortness of breath.  Patient complained of 3-4-days of worsening shortness of breath which worsened today with increased work of breathing and no improvement with home breathing treatments, so he activated EMS and on arrival of EMS team, patient was noted to be hypoxic with an O2 sat of 82% despite being on supplemental oxygen at home, NRB was provided, 2 rounds of albuterol treatment was given and patient was sent to the ED for further evaluation and management. Patient was recently admitted from 5/6 to 03/09/2023 due to acute exacerbation of COPD, HFpEF He was also admitted from 4/4 to 4/11 due to acute on chronic respiratory failure with hypoxia and hypercapnia.  ED Course:  In the emergency department, patient was tachypneic, tachycardic, BP was 158/63, O2 sat was 99% on NRB at 15 L, temperature 99.4 F.  Workup in the ED showed normal CBC except for platelets of 136.  BMP showed sodium 138, potassium 3.7, chloride 91, bicarb 43, blood glucose 137, BUN 7, creatinine 0.50.  Lactic acid x 2 was normal, magnesium 2.0.  Influenza A, B, RSV was negative.  SARS coronavirus 2 was positive. Chest x-ray showed mild bibasilar scarring and/or atelectasis. Findings which may represent a small right basilar lung nodule. Patient was started on IV ceftriaxone and azithromycin, Solu-Medrol  was given.  IV hydration was provided.  Hospitalist was asked to admit patient for further evaluation and management.  Review of Systems: Review of systems as noted in the HPI. All other systems reviewed and are negative.   Past Medical History:  Diagnosis Date   Arthritis    Bronchitis    Cancer (HCC)    metastatic NSCLC (followed by [facility])   COPD (chronic obstructive pulmonary disease) (HCC)    HTN (hypertension)    Past Surgical History:  Procedure Laterality Date   LUNG BIOPSY     TOTAL HIP ARTHROPLASTY     TUMOR REMOVAL Right 12/04/2017    Social History:  reports that he has quit smoking. His smoking use included cigarettes. He has never used smokeless tobacco. He reports current alcohol use. He reports current drug use. Drugs: Marijuana and Cocaine.   Allergies  Allergen Reactions   Ibuprofen Shortness Of Breath    History reviewed. No pertinent family history.   Prior to Admission medications   Medication Sig Start Date End Date Taking? Authorizing Provider  albuterol (PROVENTIL) (2.5 MG/3ML) 0.083% nebulizer solution Inhale 2.5 mg into the lungs every 6 (six) hours as needed for wheezing or shortness of breath.  01/19/20  Yes [provider]  albuterol (VENTOLIN HFA) 108 (90 Base) MCG/ACT inhaler Inhale 2 puffs into the lungs every 6 (six) hours as needed. 06/18/23  Yes [provider]  BREZTRI AEROSPHERE 160-9-4.8 MCG/ACT AERO Inhale 2 puffs into the lungs 2 (two) times daily. 05/17/22  Yes [provider]  clonazePAM (KLONOPIN) 1 MG tablet Take 1  mg by mouth 2 (two) times daily. 05/18/22  Yes [provider]  furosemide (LASIX) 40 MG tablet Take 1 tablet (40 mg total) by mouth daily. Patient taking differently: Take 40 mg by mouth as needed for fluid or edema. 03/09/23  Yes Vassie Loll, MD  oxyCODONE (ROXICODONE) 15 MG immediate release tablet Take 15 mg by mouth every 4 (four) hours as needed.   Yes [provider]  OXYGEN  Inhale 3 L into the lungs continuous.    Yes [provider]    Physical Exam: BP 126/85 (BP Location: Right Arm)   Pulse 61   Temp 97.9 F (36.6 C) (Oral)   Resp 20   Ht 5\' 6"  (1.676 m)   Wt 68.2 kg   SpO2 100%   BMI 24.27 kg/m   General: 62 y.o. year-old male ill appearing, but in no acute distress.  Alert and oriented x3. HEENT: NCAT, EOMI Neck: Supple, trachea medial Cardiovascular: Regular rate and rhythm with no rubs or gallops.  No thyromegaly or JVD noted.  No lower extremity edema. 2/4 pulses in all 4 extremities. Respiratory: Tachypnea.  Diffuse decreased breath sounds with scattered wheezes.   Abdomen: Soft, nontender nondistended with normal bowel sounds x4 quadrants. Muskuloskeletal: No cyanosis, clubbing or edema noted bilaterally Neuro: CN II-XII intact, strength 5/5 x 4, sensation, reflexes intact Skin: No ulcerative lesions noted or rashes Psychiatry: Judgement and insight appear normal. Mood is appropriate for condition and setting          Labs on Admission:  Basic Metabolic Panel: No results for input(s): "NA", "K", "CL", "CO2", "GLUCOSE", "BUN", "CREATININE", "CALCIUM", "MG", "PHOS" in the last 168 hours.  Liver Function Tests: No results for input(s): "AST", "ALT", "ALKPHOS", "BILITOT", "PROT", "ALBUMIN" in the last 168 hours. No results for input(s): "LIPASE", "AMYLASE" in the last 168 hours. No results for input(s): "AMMONIA" in the last 168 hours. CBC: No results for input(s): "WBC", "NEUTROABS", "HGB", "HCT", "MCV", "PLT" in the last 168 hours.  Cardiac Enzymes: No results for input(s): "CKTOTAL", "CKMB", "CKMBINDEX", "TROPONINI" in the last 168 hours.  BNP (last 3 results) Recent Labs    02/13/23 2334 03/05/23 2326 06/25/23 1442  BNP 41.0 183.0* 35.0    ProBNP (last 3 results) No results for input(s): "PROBNP" in the last 8760 hours.  CBG: No results for input(s): "GLUCAP" in the last 168 hours.  Radiological Exams on  Admission: No results found.  EKG: I independently viewed the EKG done and my findings are as followed: Normal sinus rhythm at a rate of 94 bpm  Assessment/Plan Present on Admission:  Acute on chronic respiratory failure with hypoxia (HCC)  Tobacco use disorder  Acute exacerbation of chronic obstructive pulmonary disease (COPD) (HCC)  Principal Problem:   Acute on chronic respiratory failure with hypoxia (HCC) Active Problems:   Tobacco use disorder   Acute exacerbation of chronic obstructive pulmonary disease (COPD) (HCC)   CAP (community acquired pneumonia)   Lung nodule   COVID-19 virus infection  Acute on chronic respiratory failure with hypoxia secondary to COVID-19 virus infection Acute exacerbation of COPD Continue albuterol q.6h Continue Breztri Aerosphere Continue IV Solu-Medrol 40 mg twice daily Continue Protonix to prevent steroid induced ulcer Continue Mucinex, Robitussin  Continue Tylenol p.r.n. for fever Continue supplemental oxygen to maintain O2 sat > or = 94% with plan to wean patient off supplemental oxygen as tolerated (of note, patient does not use oxygen at baseline) Continue incentive spirometry and flutter valve q102min as tolerated  Continue airborne isolation precaution  Questionable CAP POA Patient was started on ceftriaxone and azithromycin, we shall continue same at this time with plan to de-escalate/discontinue based on blood culture, sputum culture, urine Legionella, strep pneumo and procalcitonin Continue Tylenol as needed Continue Mucinex, incentive spirometry, flutter valve   Lung nodule Is small right basilar lung nodule was noted.  Correlation with nonemergent chest CT was recommended per radiology  Tobacco use disorder Patient counseled on tobacco abuse cessation   DVT prophylaxis: Lovenox  Advance Care Planning: Full Code (this was confirmed by patient)  Consults: None  Family Communication: None at bedside  Severity of  Illness: The appropriate patient status for this patient is INPATIENT. Inpatient status is judged to be reasonable and necessary in order to provide the required intensity of service to ensure the patient's safety. The patient's presenting symptoms, physical exam findings, and initial radiographic and laboratory data in the context of their chronic comorbidities is felt to place them at high risk for further clinical deterioration. Furthermore, it is not anticipated that the patient will be medically stable for discharge from the hospital within 2 midnights of admission.   * I certify that at the point of admission it is my clinical judgment that the patient will require inpatient hospital care spanning beyond 2 midnights from the point of admission due to high intensity of service, high risk for further deterioration and high frequency of surveillance required.*  Author: Frankey Shown, DO 08/10/2023 12:45 PM  For on call review [email or web address].

## 2023-06-24 NOTE — ED Notes (Signed)
Removed nrb and placed on [place] at 6lpm. Sats currently 99%. Will continue to monitor.

## 2023-06-24 NOTE — ED Notes (Signed)
Breathing tx complete. Placed pt back on nrb at 15lpm. Sats at 100% currently.

## 2023-06-25 ENCOUNTER — Inpatient Hospital Stay (HOSPITAL_COMMUNITY): Payer: 59

## 2023-06-25 DIAGNOSIS — J9622 Acute and chronic respiratory failure with hypercapnia: Secondary | ICD-10-CM

## 2023-06-25 DIAGNOSIS — U071 COVID-19: Secondary | ICD-10-CM

## 2023-06-25 DIAGNOSIS — J9621 Acute and chronic respiratory failure with hypoxia: Secondary | ICD-10-CM

## 2023-06-25 DIAGNOSIS — F17200 Nicotine dependence, unspecified, uncomplicated: Secondary | ICD-10-CM

## 2023-06-25 LAB — COMPREHENSIVE METABOLIC PANEL
ALT: 14 U/L (ref 0–44)
AST: 17 U/L (ref 15–41)
Albumin: 3 g/dL — ABNORMAL LOW (ref 3.5–5.0)
Alkaline Phosphatase: 41 U/L (ref 38–126)
Anion gap: 7 (ref 5–15)
BUN: 7 mg/dL — ABNORMAL LOW (ref 8–23)
CO2: 41 mmol/L — ABNORMAL HIGH (ref 22–32)
Calcium: 8.5 mg/dL — ABNORMAL LOW (ref 8.9–10.3)
Chloride: 91 mmol/L — ABNORMAL LOW (ref 98–111)
Creatinine, Ser: 0.6 mg/dL — ABNORMAL LOW (ref 0.61–1.24)
GFR, Estimated: >60 mL/min (ref >60)
Glucose, Bld: 179 mg/dL — ABNORMAL HIGH (ref 70–99)
Potassium: 4.6 mmol/L (ref 3.5–5.1)
Sodium: 139 mmol/L (ref 135–145)
Total Bilirubin: 0.3 mg/dL (ref 0.3–1.2)
Total Protein: 6.1 g/dL — ABNORMAL LOW (ref 6.5–8.1)

## 2023-06-25 LAB — CBC
HCT: 39.7 % (ref 39.0–52.0)
Hemoglobin: 11.9 g/dL — ABNORMAL LOW (ref 13.0–17.0)
MCH: 29.1 pg (ref 26.0–34.0)
MCHC: 30 g/dL (ref 30.0–36.0)
MCV: 97.1 fL (ref 80.0–100.0)
Platelets: 140 10*3/uL — ABNORMAL LOW (ref 150–400)
RBC: 4.09 MIL/uL — ABNORMAL LOW (ref 4.22–5.81)
RDW: 11.7 % (ref 11.5–15.5)
WBC: 3.1 10*3/uL — ABNORMAL LOW (ref 4.0–10.5)
nRBC: 0 % (ref 0.0–0.2)

## 2023-06-25 LAB — BLOOD GAS, VENOUS
Acid-Base Excess: 17.2 mmol/L — ABNORMAL HIGH (ref 0.0–2.0)
Bicarbonate: 49.3 mmol/L — ABNORMAL HIGH (ref 20.0–28.0)
Drawn by: 4237
O2 Saturation: 73.1 %
Patient temperature: 36.4
pCO2, Ven: 102 mmHg (ref 44–60)
pH, Ven: 7.29 (ref 7.25–7.43)
pO2, Ven: 40 mmHg (ref 32–45)

## 2023-06-25 LAB — PHOSPHORUS: Phosphorus: 3.3 mg/dL (ref 2.5–4.6)

## 2023-06-25 LAB — BRAIN NATRIURETIC PEPTIDE: B Natriuretic Peptide: 35 pg/mL (ref 0.0–100.0)

## 2023-06-25 LAB — MAGNESIUM: Magnesium: 2 mg/dL (ref 1.7–2.4)

## 2023-06-25 LAB — D-DIMER, QUANTITATIVE: D-Dimer, Quant: 0.91 ug{FEU}/mL — ABNORMAL HIGH (ref 0.00–0.50)

## 2023-06-25 LAB — TROPONIN I (HIGH SENSITIVITY)
Troponin I (High Sensitivity): 6 ng/L (ref <18)
Troponin I (High Sensitivity): 5 ng/L (ref <18)
Troponin I (High Sensitivity): 7 ng/L (ref <18)
Troponin I (High Sensitivity): 12 ng/L (ref <18)

## 2023-06-25 LAB — GLUCOSE, CAPILLARY: Glucose-Capillary: 129 mg/dL — ABNORMAL HIGH (ref 70–99)

## 2023-06-25 LAB — PROCALCITONIN: Procalcitonin: <0.1 ng/mL

## 2023-06-25 LAB — MRSA NEXT GEN BY PCR, NASAL: MRSA by PCR Next Gen: NOT DETECTED

## 2023-06-25 LAB — HIV ANTIBODY (ROUTINE TESTING W REFLEX): HIV Screen 4th Generation wRfx: NONREACTIVE

## 2023-06-25 LAB — LACTATE DEHYDROGENASE: LDH: 118 U/L (ref 98–192)

## 2023-06-25 LAB — STREP PNEUMONIAE URINARY ANTIGEN: Strep Pneumo Urinary Antigen: NEGATIVE

## 2023-06-25 MED ORDER — ORAL CARE MOUTH RINSE: 15.0000 mL | OROMUCOSAL | Status: DC | PRN

## 2023-06-25 MED ORDER — METHYLPREDNISOLONE SODIUM SUCC 125 MG IJ SOLR
125.0000 mg | Freq: Every day | INTRAMUSCULAR | Status: DC
  Administered 2023-06-25 – 2023-06-27 (×3): 125 mg via INTRAVENOUS
  Filled 2023-06-25 (×3): qty 2

## 2023-06-25 MED ORDER — PANTOPRAZOLE SODIUM 40 MG IV SOLR
40.0000 mg | INTRAVENOUS | Status: DC
  Administered 2023-06-25 – 2023-06-29 (×5): 40 mg via INTRAVENOUS
  Filled 2023-06-25 (×5): qty 10

## 2023-06-25 MED ORDER — UMECLIDINIUM BROMIDE 62.5 MCG/ACT IN AEPB: 1.0000 | INHALATION_SPRAY | Freq: Every day | RESPIRATORY_TRACT | Status: DC

## 2023-06-25 MED ORDER — HYDRALAZINE HCL 20 MG/ML IJ SOLN: 10.0000 mg | Freq: Four times a day (QID) | INTRAMUSCULAR | Status: DC | PRN

## 2023-06-25 MED ORDER — ALBUTEROL SULFATE (2.5 MG/3ML) 0.083% IN NEBU
2.5000 mg | INHALATION_SOLUTION | Freq: Four times a day (QID) | RESPIRATORY_TRACT | Status: DC | PRN
  Administered 2023-06-26 (×2): 2.5 mg via RESPIRATORY_TRACT
  Filled 2023-06-25 (×2): qty 3

## 2023-06-25 MED ORDER — SODIUM CHLORIDE 0.9 % IV SOLN: 100.0000 mg | INTRAVENOUS | Status: AC

## 2023-06-25 MED ORDER — SODIUM CHLORIDE 0.9 % IV SOLN
100.0000 mg | INTRAVENOUS | Status: AC
  Administered 2023-06-25 (×2): 100 mg via INTRAVENOUS
  Filled 2023-06-25 (×2): qty 20

## 2023-06-25 MED ORDER — BUDESONIDE 0.5 MG/2ML IN SUSP
0.5000 mg | Freq: Two times a day (BID) | RESPIRATORY_TRACT | Status: DC
  Administered 2023-06-25 – 2023-07-01 (×13): 0.5 mg via RESPIRATORY_TRACT
  Filled 2023-06-25 (×13): qty 2

## 2023-06-25 MED ORDER — CHLORHEXIDINE GLUCONATE CLOTH 2 % EX PADS
6.0000 | MEDICATED_PAD | Freq: Every day | CUTANEOUS | Status: DC
  Administered 2023-06-25 – 2023-06-28 (×4): 6 via TOPICAL

## 2023-06-25 MED ORDER — SODIUM CHLORIDE 0.9 % IV SOLN
100.0000 mg | Freq: Every day | INTRAVENOUS | Status: AC
  Administered 2023-06-26 – 2023-06-27 (×2): 100 mg via INTRAVENOUS
  Filled 2023-06-25 (×2): qty 20

## 2023-06-25 MED ORDER — SODIUM CHLORIDE 0.9 % IV SOLN: 200.0000 mg | Freq: Once | INTRAVENOUS | Status: DC

## 2023-06-25 MED ORDER — METHYLPREDNISOLONE SODIUM SUCC 125 MG IJ SOLR: 80.0000 mg | Freq: Every day | INTRAMUSCULAR | Status: DC

## 2023-06-25 MED ORDER — IPRATROPIUM-ALBUTEROL 0.5-2.5 (3) MG/3ML IN SOLN
3.0000 mL | Freq: Four times a day (QID) | RESPIRATORY_TRACT | Status: DC
  Administered 2023-06-25 – 2023-06-29 (×16): 3 mL via RESPIRATORY_TRACT
  Filled 2023-06-25 (×17): qty 3

## 2023-06-25 MED ORDER — IOHEXOL 350 MG/ML SOLN
75.0000 mL | Freq: Once | INTRAVENOUS | Status: AC | PRN
  Administered 2023-06-25: 75 mL via INTRAVENOUS

## 2023-06-25 MED ORDER — MORPHINE SULFATE (PF) 2 MG/ML IV SOLN
INTRAVENOUS | Status: AC
  Administered 2023-06-25: 2 mg via INTRAVENOUS
  Filled 2023-06-25: qty 1

## 2023-06-25 MED ORDER — BUDESON-GLYCOPYRROL-FORMOTEROL 160-9-4.8 MCG/ACT IN AERO: 2.0000 | INHALATION_SPRAY | Freq: Two times a day (BID) | RESPIRATORY_TRACT | Status: DC

## 2023-06-25 MED ORDER — SODIUM CHLORIDE 0.9 % IV SOLN: 100.0000 mg | Freq: Every day | INTRAVENOUS | Status: DC

## 2023-06-25 MED ORDER — ARFORMOTEROL TARTRATE 15 MCG/2ML IN NEBU
15.0000 ug | INHALATION_SOLUTION | Freq: Two times a day (BID) | RESPIRATORY_TRACT | Status: DC
  Administered 2023-06-25 – 2023-07-01 (×13): 15 ug via RESPIRATORY_TRACT
  Filled 2023-06-25 (×13): qty 2

## 2023-06-25 MED ORDER — FLUTICASONE FUROATE-VILANTEROL 200-25 MCG/ACT IN AEPB: 1.0000 | INHALATION_SPRAY | Freq: Every day | RESPIRATORY_TRACT | Status: DC

## 2023-06-25 NOTE — ED Notes (Signed)
Pt given turkey sandwich per request

## 2023-06-25 NOTE — Progress Notes (Signed)
PROGRESS NOTE  KARSTEN ALBA WUJ:[id number] DOB: 1961/05/23 DOA: 06/24/2023 PCP: [name], NP  Brief History:  62 year old male with a history of COPD, chronic respiratory failure 3 L, HFpEF, stage IIIb non-small cell lung cancer, tobacco abuse, hypertension presenting with 3 days of shortness of breath that has significantly worsened on 06/24/2023.  He denied any fevers, chills, headache, neck pain, nausea, vomiting or diarrhea or abdominal pain.  He had some substernal chest pain particularly with coughing.  He denies any hemoptysis.  He denies any diarrhea, hematochezia, melena, abdominal pain.  He has increased use of his nebulizer machine without much improvement.  As result he presented for further evaluation and treatment.  He continues to complain of his chronic right-sided upper abdominal and right lower chest pain. In the ED, the patient was afebrile hemodynamically stable with oxygen saturation of 95% on 6 L.  WBC 4.0, hemoglobin 1.9, platelets 1-40,000.  Sodium 139, potassium 4.6, bicarbonate 41, serum creatinine 0.60.  LFTs were unremarkable.  Chest x-ray showed bibasilar scarring and chronic interstitial markings.  Lactic acid 0.7>> 0.6.  COVID-19 PCR was positive.  EKG shows sinus rhythm without any ST-T wave change Regarding his lung cancer, he previously followed at [facility] and tx with pembrolizumab arm. Received 35 cycles 07/12/15 - 07/16/17.  Review of the medical record shows that his last office appointment was 07/19/2020.  The patient was recently hospitalized from 03/05/2023 to 03/09/2023 for acute on chronic respiratory failure secondary to COPD exacerbation.  He required BiPAP initially during that hospitalization but was weaned back to his usual 3 L at the time of discharge. In addition, the patient was also admitted from four 02/01/23 to 02/08/23 due to acute on chronic respiratory failure with hypoxia and hypercapnia secondary to COPD exacerbation.  During that  hospitalization, the patient also required BiPAP.   Assessment/Plan: Acute on chronic respiratory failure with hypoxia and hypercarbia -Chronically on 3 L at home -Currently on 6 L -Secondary COPD exacerbation -Wean oxygen back to baseline for saturation 88-92%  COVID-19 infection -plan remdesivir x 3 days -IV solumedrol  COPD exacerbation -Start Pulmicort -Continue Solu-Medrol -Start Brovana -Continue DuoNebs  Malignancy related pain -PDMP queried--receives monthly oxycodone 15 mg  #150 from K. Milinda Cave, DNP--last refill 8/191/24 -also receives clonazepam 1 mg, #60, last refill 06/18/23 -EKG--neg for ischemic changes -pt has had chronic Right lower chest, RUQ pain since his surgery 12/04/17 -10/20/19 CT chest--Scattered small pulmonary cysts. Diffuse bronchial wall thickening suggests airway predominant COPD. Fusiform bronchiectasis within both lower lobes, with areas of mucus plugging again noted in the periphery of the right lower lobe. 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, mediastinal mass excision and lymph node dissection with nerve sheath tumor resection 12/04/2017 at [facility] -follow up cardiothoracic at [facility]   Essential hypertension -BP remains controlled   Medication induced Hyperglycemia/Impaired glucose tolerance -12/12/19 hemoglobin A1c--6.4 -04/12/20 A1C--6.6 -05/02/21 A1C 6.0 -06/09/22 A1C--5.2 -start novolog ISS -elevated CBG partly due to steroids -carb modified diet   Tobacco abuse -cessation discussed       Family Communication: no  Family at bedside  Consultants:  none  Code Status:  FULL   DVT Prophylaxis:   [place] Lovenox   Procedures: As  Listed in Progress Note  Above  Antibiotics: None     Subjective: Pt states sob is a little better, but remains sob.  Denies f/c, n/v/d, hemoptysis.  Objective: Vitals:   06/25/23 0400 06/25/23 0500 06/25/23 0530 06/25/23 0747  BP: (!) 101/53 115/70 108/65   Pulse: 71 70 72   Resp: 18 18 19    Temp:    97.9 F (36.6 C)  TempSrc:    Oral  SpO2: 98% 96% 95%     Intake/Output Summary (Last 24 hours) at 06/25/2023 0750 Last data filed at 06/24/2023 2112 Gross per 24 hour  Intake 250 ml  Output --  Net 250 ml   Weight change:  Exam:  General:  Pt is alert, follows commands appropriately, not in acute distress HEENT: No icterus, No thrush, No neck mass, [place]/AT Cardiovascular: RRR, S1/S2, no rubs, no gallops Respiratory:bilateral rales.  Bilateral exp wheeze Abdomen: Soft/+BS, non tender, non distended, no guarding Extremities: No edema, No lymphangitis, No petechiae, No rashes, no synovitis   Data Reviewed: I have personally reviewed following labs and imaging studies Basic Metabolic Panel: Recent Labs  Lab 06/24/23 1924 06/25/23 0407  NA 138 139  K 3.7 4.6  CL 91* 91*  CO2 43* 41*  GLUCOSE 137* 179*  BUN 7* 7*  CREATININE 0.50* 0.60*  CALCIUM 8.4* 8.5*  MG 2.0 2.0  PHOS  --  3.3   Liver Function Tests: Recent Labs  Lab 06/25/23 0407  AST 17  ALT 14  ALKPHOS 41  BILITOT 0.3  PROT 6.1*  ALBUMIN 3.0*   No results for input(s): "LIPASE", "AMYLASE" in the last 168 hours. No results for input(s): "AMMONIA" in the last 168 hours. Coagulation Profile: Recent Labs  Lab 06/24/23 1924  INR 0.9   CBC: Recent Labs  Lab 06/24/23 1924 06/25/23 0407  WBC 4.0 3.1*  NEUTROABS 1.9  --   HGB 13.0 11.9*  HCT 42.0 39.7  MCV 95.2 97.1  PLT 136* 140*   Cardiac Enzymes: No results for input(s): "CKTOTAL", "CKMB", "CKMBINDEX", "TROPONINI" in the last 168 hours. BNP: Invalid input(s): "POCBNP" CBG: No results for input(s): "GLUCAP" in the last 168 hours. HbA1C: No results for  input(s): "HGBA1C" in the last 72 hours. Urine analysis:    Component Value Date/Time   COLORURINE YELLOW 03/06/2023 1130   APPEARANCEUR HAZY (A) 03/06/2023 1130   LABSPEC 1.023 03/06/2023 1130   PHURINE 5.0 03/06/2023 1130   GLUCOSEU 50 (A) 03/06/2023 1130   HGBUR NEGATIVE 03/06/2023 1130   BILIRUBINUR NEGATIVE 03/06/2023 1130   KETONESUR NEGATIVE 03/06/2023 1130   PROTEINUR 100 (A) 03/06/2023 1130   UROBILINOGEN 0.2 10/21/2013 0200   NITRITE NEGATIVE 03/06/2023 1130   LEUKOCYTESUR NEGATIVE 03/06/2023 1130   Sepsis Labs: @LABRCNTIP (procalcitonin:4,lacticidven:4) ) Recent Results (from the past 240 hour(s))  Blood Culture (routine x 2)     Status: None (Preliminary result)   Collection Time: 06/24/23  7:23 PM   Specimen: BLOOD LEFT HAND  Result Value Ref Range Status   Specimen Description BLOOD LEFT HAND BLOOD  Final   Special Requests   Final    BOTTLES DRAWN AEROBIC AND ANAEROBIC Blood Culture adequate volume   Culture   Final    NO GROWTH < 12 HOURS Performed at [facility], [address] [place], [place] [address]    Report Status PENDING  Incomplete  Resp panel by RT-PCR (RSV, Flu A&B, Covid) Anterior Nasal Swab     Status: Abnormal   Collection Time: 06/24/23  7:29 PM   Specimen: Anterior Nasal Swab  Result Value Ref Range Status   SARS Coronavirus 2 by RT PCR POSITIVE (A) NEGATIVE Final    Comment: (NOTE) SARS-CoV-2 target nucleic acids are DETECTED.  The SARS-CoV-2 RNA is generally detectable in upper respiratory specimens during the acute phase of infection. Positive results are indicative of the presence of the identified virus, but do not rule out bacterial infection or co-infection with other pathogens not detected by the test. Clinical correlation with patient history and other diagnostic information is necessary to determine patient infection status. The expected result is Negative.  Fact Sheet for  Patients: BloggerCourse.com  Fact Sheet for Healthcare Providers: SeriousBroker.it  This test is not yet approved or cleared by the [place] FDA and  has been authorized for detection and/or diagnosis of SARS-CoV-2 by FDA under an Emergency Use Authorization (EUA).  This EUA will remain in effect (meaning this test can be used) for the duration of  the COVID-19 declaration under Section 564(b)(1) of the A ct, 21 U.S.C. section 360bbb-3(b)(1), unless the authorization is terminated or revoked sooner.     Influenza A by PCR NEGATIVE NEGATIVE Final   Influenza B by PCR NEGATIVE NEGATIVE Final    Comment: (NOTE) The Xpert Xpress SARS-CoV-2/FLU/RSV plus assay is intended as an aid in the diagnosis of influenza from Nasopharyngeal swab specimens and should not be used as a sole basis for treatment. Nasal washings and aspirates are unacceptable for Xpert Xpress SARS-CoV-2/FLU/RSV testing.  Fact Sheet for Patients: BloggerCourse.com  Fact Sheet for Healthcare Providers: SeriousBroker.it  This test is not yet approved or cleared by the [place] FDA and has been authorized for detection and/or diagnosis of SARS-CoV-2 by FDA under an Emergency Use Authorization (EUA). This EUA will remain in effect (meaning this test can be used) for the duration of the COVID-19 declaration under Section 564(b)(1) of the Act, 21 U.S.C. section 360bbb-3(b)(1), unless the authorization is terminated or revoked.     Resp Syncytial Virus by PCR NEGATIVE NEGATIVE Final    Comment: (NOTE) Fact Sheet for Patients: BloggerCourse.com  Fact Sheet for Healthcare Providers: SeriousBroker.it  This test is not yet approved or cleared by the [place] FDA and has been authorized for detection and/or diagnosis of SARS-CoV-2 by FDA under an Emergency Use  Authorization (EUA). This EUA will remain in effect (meaning this test can be used) for the duration of the COVID-19 declaration under Section 564(b)(1) of the Act, 21 U.S.C. section 360bbb-3(b)(1), unless the authorization is terminated or revoked.  Performed at [facility], [address] [place], [place] [address]   Blood Culture (routine x 2)     Status: None (Preliminary result)   Collection Time: 06/24/23  7:33 PM   Specimen: BLOOD LEFT HAND  Result Value Ref Range Status   Specimen Description BLOOD LEFT HAND BLOOD  Final   Special Requests   Final    BOTTLES DRAWN AEROBIC AND ANAEROBIC Blood Culture results may not be optimal due to an excessive volume of blood received in culture bottles   Culture   Final    NO GROWTH < 12 HOURS Performed at [facility], [address], [place], [place] [address]    Report Status PENDING  Incomplete     Scheduled Meds:  arformoterol  15 mcg Nebulization BID   budesonide (PULMICORT) nebulizer solution  0.5 mg Nebulization BID   dextromethorphan-guaiFENesin  1 tablet Oral BID   enoxaparin (LOVENOX) injection  40 mg Subcutaneous  Q24H   ipratropium-albuterol  3 mL Nebulization Q6H   methylPREDNISolone (SOLU-MEDROL) injection  125 mg Intravenous Daily   pantoprazole (PROTONIX) IV  40 mg Intravenous Q24H   Continuous Infusions:  azithromycin Stopped (06/24/23 2112)   cefTRIAXone (ROCEPHIN)  IV Stopped (06/24/23 1954)   remdesivir 100 mg in sodium chloride 0.9 % 100 mL IVPB     Followed by   Melene Muller ON 06/26/2023] remdesivir 100 mg in sodium chloride 0.9 % 100 mL IVPB      Procedures/Studies: DG Chest Port 1 View  Result Date: 06/24/2023 CLINICAL DATA:  Shortness of breath. EXAM: PORTABLE CHEST 1 VIEW COMPARISON:  May 06, 2023 FINDINGS: The heart size and mediastinal contours are within normal limits. The lungs are hyperinflated. Mild, diffuse, chronic appearing increased interstitial lung markings are noted. Mild scarring and/or  atelectasis is seen within bilateral lung bases. A 5 mm nodular opacity is also seen overlying the right lung base. No pleural effusion or pneumothorax is identified. Multilevel degenerative changes seen throughout the thoracic spine. IMPRESSION: 1. Mild bibasilar scarring and/or atelectasis. 2. Findings which may represent a small right basilar lung nodule. Correlation with nonemergent chest CT is recommended. Electronically Signed   By: Aram Candela M.D.   On: 06/24/2023 20:21    Catarina Hartshorn, DO  [facility]  If 7PM-7AM, please contact night-coverage [email or web address] Password [email or web address] 06/25/2023, 7:50 AM   LOS: 1 day

## 2023-06-25 NOTE — ED Notes (Addendum)
Remdesivir held due to medication availability. Pharmacy contacted and updated that patient will be being transferred to 300. MAR has been updated as well

## 2023-06-25 NOTE — TOC Initial Note (Signed)
Transition of Care [facility]) - Initial/Assessment Note    Patient Details  Name: Miguel Marsh MRN: [id number] Date of Birth: May 10, 1961  [facility] of [facility] CM/SW Contact:    Villa Herb, LCSWA Phone Number: 06/25/2023, 9:31 AM  Clinical Narrative:                 Pt is high risk for readmission. Pt lives alone. Pt has a CAP aid 3-4 hours a day, 7 days a week. Pt wears 3L home O2, but is unsure on agency who provides. Pt uses [employer] for transportation. TOC to follow.   Expected Discharge Plan: Home/Self Care Barriers to Discharge: Continued Medical Work up   Patient Goals and CMS Choice Patient states their goals for this hospitalization and ongoing recovery are:: return home CMS Medicare.gov Compare Post Acute Care list provided to:: Patient Choice offered to / list presented to : Patient      Expected Discharge Plan and Services In-house Referral: Clinical Social Work Discharge Planning Services: CM Consult   Living arrangements for the past 2 months: Apartment                                      Prior Living Arrangements/Services Living arrangements for the past 2 months: Apartment Lives with:: Self Patient language and need for interpreter reviewed:: Yes Do you feel safe going back to the place where you live?: Yes      Need for Family Participation in Patient Care: Yes (Comment) Care giver support system in place?: Yes (comment) Current home services: DME Criminal Activity/Legal Involvement Pertinent to Current Situation/Hospitalization: No - Comment as needed  Activities of Daily Living      Permission Sought/Granted                  Emotional Assessment Appearance:: Appears stated age Attitude/Demeanor/Rapport: Engaged Affect (typically observed): Accepting Orientation: : Oriented to Self, Oriented to Place, Oriented to  Time, Oriented to Situation Alcohol / Substance Use: Not Applicable Psych Involvement: No (comment)  Admission  diagnosis:  Acute on chronic respiratory failure with hypoxia (HCC) [J96.21] Acute on chronic respiratory failure with hypoxia and hypercapnia (HCC) [J96.21, J96.22] Acute hypoxic respiratory failure (HCC) [J96.01] COVID-19 [U07.1] Patient Active Problem List   Diagnosis Date Noted   CAP (community acquired pneumonia) 06/24/2023   Lung nodule 06/24/2023   COVID-19 virus infection 06/24/2023   Elevated brain natriuretic peptide (BNP) level 03/06/2023   Elevated MCV 03/06/2023   Acute metabolic encephalopathy 02/01/2023   Leukocytosis 10/06/2020   Goals of care, counseling/discussion    Palliative care by specialist    DNR (do not resuscitate) discussion    Hyperglycemia 04/12/2020   Chronic respiratory failure with hypoxia and hypercapnia (HCC) 01/22/2020   Acute respiratory failure (HCC) 01/23/2018   Acute on chronic respiratory failure with hypoxia and hypercapnia (HCC) 01/23/2018   Acute on chronic respiratory failure with hypoxia (HCC) 01/01/2018   Obesity, Class III, BMI 40-49.9 (morbid obesity) (HCC) 01/01/2018   COPD with exacerbation (HCC) 01/01/2018   Essential hypertension 12/09/2015   Non-small cell cancer of right lung (in remission) 12/09/2015   GERD (gastroesophageal reflux disease) 12/09/2015   COPD with acute exacerbation (HCC) 12/17/2014   Tobacco use disorder 12/17/2014   COPD exacerbation (HCC) 12/17/2014   Obesity 12/17/2014   Dyspnea    Difficulty in walking(719.7) 04/02/2013   Hip weakness 04/02/2013   PCP:  Milinda Cave,  [place], NP Pharmacy:   [phone] - [place], [place] - [address]  [place] [address] Phone: [phone] Fax: [phone]     Social Determinants of Health (SDOH) Social History: SDOH Screenings   Tobacco Use: Medium Risk (06/24/2023)   SDOH Interventions:     Readmission Risk Interventions    06/25/2023    9:30 AM 03/07/2023    9:19 AM   Readmission Risk Prevention Plan  Transportation Screening Complete Complete  HRI or Home Care Consult Complete Complete  Social Work Consult for Recovery Care Planning/Counseling Complete Complete  Palliative Care Screening Not Applicable Not Applicable  Medication Review Oceanographer) Complete Complete

## 2023-06-25 NOTE — ED Notes (Signed)
ED TO INPATIENT HANDOFF REPORT  ED Nurse Name and Phone #:   S Name/Age/Gender Miguel Marsh 62 y.o. male Room/Bed: APA14/APA14  Code Status   Code Status: Full Code  Home/SNF/Other Skilled nursing facility Patient oriented to: self, place, time, and situation Is this baseline? Yes   Triage Complete: Triage complete  Chief Complaint Acute on chronic respiratory failure with hypoxia (HCC) [J96.21] Acute on chronic respiratory failure with hypoxia and hypercapnia (HCC) [W11.91, J96.22]  Triage Note Pt called EMS with difficulty breathing. Had breathing treatments at home no relief. EMS gave 2 albuterol. At home he was 82% on O2.    Allergies Allergies  Allergen Reactions   Ibuprofen Shortness Of Breath    Level of Care/Admitting Diagnosis ED Disposition     ED Disposition  Admit   Condition  --   Comment  Hospital Area: [facility] [[id number]]  Level of Care: Med-Surg [16]  Covid Evaluation: Confirmed COVID Positive  Diagnosis: Acute on chronic respiratory failure with hypoxia and hypercapnia [facility]) [[id number]]  Admitting Physician: TAT, DAVID [[id number]]  Attending Physician: TAT, DAVID [email or web address]  Certification:: I certify this patient will need inpatient services for at least 2 midnights          B Medical/Surgery History Past Medical History:  Diagnosis Date   Arthritis    Bronchitis    Cancer (HCC)    metastatic NSCLC (followed by [facility])   COPD (chronic obstructive pulmonary disease) (HCC)    HTN (hypertension)    Past Surgical History:  Procedure Laterality Date   LUNG BIOPSY     TOTAL HIP ARTHROPLASTY     TUMOR REMOVAL Right 12/04/2017     A IV Location/Drains/Wounds Patient Lines/Drains/Airways Status     Active Line/Drains/Airways     Name Placement date Placement time Site Days   Peripheral IV 06/24/23 20 G Left;Posterior Wrist 06/24/23  1920  Wrist  1   Peripheral IV 06/24/23 22 G Posterior;Right Hand 06/24/23  2034  Hand  1             Intake/Output Last 24 hours  Intake/Output Summary (Last 24 hours) at 06/25/2023 2130 Last data filed at 06/24/2023 2112 Gross per 24 hour  Intake 250 ml  Output --  Net 250 ml    Labs/Imaging Results for orders placed or performed during the hospital encounter of 06/24/23 (from the past 48 hour(s))  Blood Culture (routine x 2)     Status: None (Preliminary result)   Collection Time: 06/24/23  7:23 PM   Specimen: BLOOD LEFT HAND  Result Value Ref Range   Specimen Description BLOOD LEFT HAND BLOOD    Special Requests      BOTTLES DRAWN AEROBIC AND ANAEROBIC Blood Culture adequate volume   Culture      NO GROWTH < 12 HOURS Performed at [facility], [address] [place], [place] [address]    Report Status PENDING   CBC with Differential     Status: Abnormal   Collection Time: 06/24/23  7:24 PM  Result Value Ref Range   WBC 4.0 4.0 - 10.5 K/uL   RBC 4.41 4.22 - 5.81 MIL/uL   Hemoglobin 13.0 13.0 - 17.0 g/dL   HCT 46.9 62.9 - 52.8 %   MCV 95.2 80.0 - 100.0 fL   MCH 29.5 26.0 - 34.0 pg   MCHC 31.0 30.0 - 36.0 g/dL   RDW 41.3 24.4 - 01.0 %   Platelets 136 (L) 150 - 400 K/uL  nRBC 0.0 0.0 - 0.2 %   Neutrophils Relative % 48 %   Neutro Abs 1.9 1.7 - 7.7 K/uL   Lymphocytes Relative 32 %   Lymphs Abs 1.3 0.7 - 4.0 K/uL   Monocytes Relative 20 %   Monocytes Absolute 0.8 0.1 - 1.0 K/uL   Eosinophils Relative 0 %   Eosinophils Absolute 0.0 0.0 - 0.5 K/uL   Basophils Relative 0 %   Basophils Absolute 0.0 0.0 - 0.1 K/uL   Immature Granulocytes 0 %   Abs Immature Granulocytes 0.01 0.00 - 0.07 K/uL    Comment: Performed at [facility], [address], [place], [place] [address]  Basic metabolic panel     Status: Abnormal   Collection Time: 06/24/23  7:24 PM  Result Value Ref Range   Sodium 138 135 - 145 mmol/L   Potassium 3.7 3.5 - 5.1 mmol/L   Chloride 91 (L) 98 - 111 mmol/L   CO2 43 (H) 22 - 32 mmol/L   Glucose, Bld 137 (H) 70 - 99 mg/dL    Comment: Glucose  reference range applies only to samples taken after fasting for at least 8 hours.   BUN 7 (L) 8 - 23 mg/dL   Creatinine, Ser 2.13 (L) 0.61 - 1.24 mg/dL   Calcium 8.4 (L) 8.9 - 10.3 mg/dL   GFR, Estimated >08 >65 mL/min    Comment: (NOTE) Calculated using the CKD-EPI Creatinine Equation (2021)    Anion gap 4 (L) 5 - 15    Comment: Performed at [facility], [address] [place], [place] [address]  Magnesium     Status: None   Collection Time: 06/24/23  7:24 PM  Result Value Ref Range   Magnesium 2.0 1.7 - 2.4 mg/dL    Comment: Performed at [facility], [address] [place], [place] [address]  Lactic acid, plasma     Status: None   Collection Time: 06/24/23  7:24 PM  Result Value Ref Range   Lactic Acid, Venous 0.7 0.5 - 1.9 mmol/L    Comment: Performed at [facility], [address] [place], [place] [address]  Protime-INR     Status: None   Collection Time: 06/24/23  7:24 PM  Result Value Ref Range   Prothrombin Time 12.7 11.4 - 15.2 seconds   INR 0.9 0.8 - 1.2    Comment: (NOTE) INR goal varies based on device and disease states. Performed at [facility], [address], [place], [place] [address]   APTT     Status: None   Collection Time: 06/24/23  7:24 PM  Result Value Ref Range   aPTT 27 24 - 36 seconds    Comment: Performed at [facility], [address], [place], [place] [address]  Resp panel by RT-PCR (RSV, Flu A&B, Covid) Anterior Nasal Swab     Status: Abnormal   Collection Time: 06/24/23  7:29 PM   Specimen: Anterior Nasal Swab  Result Value Ref Range   SARS Coronavirus 2 by RT PCR POSITIVE (A) NEGATIVE    Comment: (NOTE) SARS-CoV-2 target nucleic acids are DETECTED.  The SARS-CoV-2 RNA is generally detectable in upper respiratory specimens during the acute phase of infection. Positive results are indicative of the presence of the identified virus, but do not rule out bacterial infection or co-infection with other pathogens not detected by the test.  Clinical correlation with patient history and other diagnostic information is necessary to determine patient infection status. The expected result is Negative.  Fact Sheet  for Patients: BloggerCourse.com  Fact Sheet for Healthcare Providers: SeriousBroker.it  This test is not yet approved or cleared by the [place] FDA and  has been authorized for detection and/or diagnosis of SARS-CoV-2 by FDA under an Emergency Use Authorization (EUA).  This EUA will remain in effect (meaning this test can be used) for the duration of  the COVID-19 declaration under Section 564(b)(1) of the A ct, 21 U.S.C. section 360bbb-3(b)(1), unless the authorization is terminated or revoked sooner.     Influenza A by PCR NEGATIVE NEGATIVE   Influenza B by PCR NEGATIVE NEGATIVE    Comment: (NOTE) The Xpert Xpress SARS-CoV-2/FLU/RSV plus assay is intended as an aid in the diagnosis of influenza from Nasopharyngeal swab specimens and should not be used as a sole basis for treatment. Nasal washings and aspirates are unacceptable for Xpert Xpress SARS-CoV-2/FLU/RSV testing.  Fact Sheet for Patients: BloggerCourse.com  Fact Sheet for Healthcare Providers: SeriousBroker.it  This test is not yet approved or cleared by the [place] FDA and has been authorized for detection and/or diagnosis of SARS-CoV-2 by FDA under an Emergency Use Authorization (EUA). This EUA will remain in effect (meaning this test can be used) for the duration of the COVID-19 declaration under Section 564(b)(1) of the Act, 21 U.S.C. section 360bbb-3(b)(1), unless the authorization is terminated or revoked.     Resp Syncytial Virus by PCR NEGATIVE NEGATIVE    Comment: (NOTE) Fact Sheet for Patients: BloggerCourse.com  Fact Sheet for Healthcare Providers: SeriousBroker.it  This  test is not yet approved or cleared by the [place] FDA and has been authorized for detection and/or diagnosis of SARS-CoV-2 by FDA under an Emergency Use Authorization (EUA). This EUA will remain in effect (meaning this test can be used) for the duration of the COVID-19 declaration under Section 564(b)(1) of the Act, 21 U.S.C. section 360bbb-3(b)(1), unless the authorization is terminated or revoked.  Performed at [facility], [address], [place], [place] [address]   Blood Culture (routine x 2)     Status: None (Preliminary result)   Collection Time: 06/24/23  7:33 PM   Specimen: BLOOD LEFT HAND  Result Value Ref Range   Specimen Description BLOOD LEFT HAND BLOOD    Special Requests      BOTTLES DRAWN AEROBIC AND ANAEROBIC Blood Culture results may not be optimal due to an excessive volume of blood received in culture bottles   Culture      NO GROWTH < 12 HOURS Performed at [facility], [address] [place], [place] [address]    Report Status PENDING   Lactic acid, plasma     Status: None   Collection Time: 06/24/23  9:21 PM  Result Value Ref Range   Lactic Acid, Venous 0.6 0.5 - 1.9 mmol/L    Comment: Performed at [facility], [address], [place], [place] [address]  Comprehensive metabolic panel     Status: Abnormal   Collection Time: 06/25/23  4:07 AM  Result Value Ref Range   Sodium 139 135 - 145 mmol/L   Potassium 4.6 3.5 - 5.1 mmol/L   Chloride 91 (L) 98 - 111 mmol/L   CO2 41 (H) 22 - 32 mmol/L   Glucose, Bld 179 (H) 70 - 99 mg/dL    Comment: Glucose reference range applies only to samples taken after fasting for at least 8 hours.   BUN 7 (L) 8 - 23 mg/dL   Creatinine, Ser 2.13 (L) 0.61 - 1.24 mg/dL   Calcium  8.5 (L) 8.9 - 10.3 mg/dL   Total Protein 6.1 (L) 6.5 - 8.1 g/dL   Albumin 3.0 (L) 3.5 - 5.0 g/dL   AST 17 15 - 41 U/L   ALT 14 0 - 44 U/L   Alkaline Phosphatase 41 38 - 126 U/L   Total Bilirubin 0.3 0.3 - 1.2 mg/dL   GFR, Estimated >16 >10  mL/min    Comment: (NOTE) Calculated using the CKD-EPI Creatinine Equation (2021)    Anion gap 7 5 - 15    Comment: Performed at [facility], [address], [place], [place] [address]  CBC     Status: Abnormal   Collection Time: 06/25/23  4:07 AM  Result Value Ref Range   WBC 3.1 (L) 4.0 - 10.5 K/uL   RBC 4.09 (L) 4.22 - 5.81 MIL/uL   Hemoglobin 11.9 (L) 13.0 - 17.0 g/dL   HCT 40.9 81.1 - 91.4 %   MCV 97.1 80.0 - 100.0 fL   MCH 29.1 26.0 - 34.0 pg   MCHC 30.0 30.0 - 36.0 g/dL   RDW 78.2 95.6 - 21.3 %   Platelets 140 (L) 150 - 400 K/uL   nRBC 0.0 0.0 - 0.2 %    Comment: Performed at [facility], [address], [place], [place] [address]  Magnesium     Status: None   Collection Time: 06/25/23  4:07 AM  Result Value Ref Range   Magnesium 2.0 1.7 - 2.4 mg/dL    Comment: Performed at [facility], [address] [place], [place] [address]  Phosphorus     Status: None   Collection Time: 06/25/23  4:07 AM  Result Value Ref Range   Phosphorus 3.3 2.5 - 4.6 mg/dL    Comment: Performed at [facility], [address] [place], [place] [address]   DG Chest Port 1 View  Result Date: 06/24/2023 CLINICAL DATA:  Shortness of breath. EXAM: PORTABLE CHEST 1 VIEW COMPARISON:  May 06, 2023 FINDINGS: The heart size and mediastinal contours are within normal limits. The lungs are hyperinflated. Mild, diffuse, chronic appearing increased interstitial lung markings are noted. Mild scarring and/or atelectasis is seen within bilateral lung bases. A 5 mm nodular opacity is also seen overlying the right lung base. No pleural effusion or pneumothorax is identified. Multilevel degenerative changes seen throughout the thoracic spine. IMPRESSION: 1. Mild bibasilar scarring and/or atelectasis. 2. Findings which may represent a small right basilar lung nodule. Correlation with nonemergent chest CT is recommended. Electronically Signed   By: Aram Candela M.D.   On: 06/24/2023 20:21    Pending Labs Unresulted  Labs (From admission, onward)     Start     Ordered   07/01/23 0500  Creatinine, serum  (enoxaparin (LOVENOX)    CrCl >/= 30 ml/min)  Weekly,   R     Comments: while on enoxaparin therapy    06/24/23 2259   06/26/23 0500  Basic metabolic panel  Tomorrow morning,   R        06/25/23 0750   06/26/23 0500  Magnesium  Tomorrow morning,   R        06/25/23 0750   06/26/23 0500  CBC  Tomorrow morning,   R        06/25/23 0750   06/25/23 0750  Procalcitonin  Once,   R       References:    Procalcitonin Lower Respiratory Tract Infection AND Sepsis Procalcitonin Algorithm   06/25/23 0750   06/24/23 2254  HIV Antibody (routine testing w rflx)  (HIV Antibody (Routine testing w reflex) panel)  Once,   R        06/24/23 2259   06/24/23 2254  Expectorated Sputum Assessment w Gram Stain, Rflx to Resp Cult  (COPD / Pneumonia / Cellulitis / Lower Extremity Wound)  Once,   R        06/24/23 2259   06/24/23 2254  Legionella Pneumophila Serogp 1 Ur Ag  (COPD / Pneumonia / Cellulitis / Lower Extremity Wound)  Once,   R        06/24/23 2259   06/24/23 2254  Strep pneumoniae urinary antigen  (COPD / Pneumonia / Cellulitis / Lower Extremity Wound)  Once,   R        06/24/23 2259            Vitals/Pain Today's Vitals   06/25/23 0530 06/25/23 0747 06/25/23 0800 06/25/23 0813  BP: 108/65  (!) 147/79   Pulse: 72  76   Resp: 19     Temp:  97.9 F (36.6 C)    TempSrc:  Oral    SpO2: 95%  97% 96%  PainSc:        Isolation Precautions Airborne and Contact precautions  Medications Medications  albuterol (PROVENTIL,VENTOLIN) solution continuous neb (0 mg Nebulization Stopped 06/24/23 2018)  cefTRIAXone (ROCEPHIN) 2 g in sodium chloride 0.9 % 100 mL IVPB (0 g Intravenous Stopped 06/24/23 1954)  azithromycin (ZITHROMAX) 500 mg in sodium chloride 0.9 % 250 mL IVPB (0 mg Intravenous Stopped 06/24/23 2112)  albuterol (PROVENTIL) (2.5 MG/3ML) 0.083% nebulizer solution (  Canceled Entry 06/24/23 1926)   albuterol (PROVENTIL) (2.5 MG/3ML) 0.083% nebulizer solution 10 mg (0 mg Nebulization Stopped 06/25/23 0043)  enoxaparin (LOVENOX) injection 40 mg (has no administration in time range)  acetaminophen (TYLENOL) tablet 650 mg (has no administration in time range)    Or  acetaminophen (TYLENOL) suppository 650 mg (has no administration in time range)  ondansetron (ZOFRAN) tablet 4 mg (has no administration in time range)    Or  ondansetron (ZOFRAN) injection 4 mg (has no administration in time range)  dextromethorphan-guaiFENesin (MUCINEX DM) 30-600 MG per 12 hr tablet 1 tablet (1 tablet Oral Given 06/25/23 0219)  pantoprazole (PROTONIX) injection 40 mg (has no administration in time range)  remdesivir 100 mg in sodium chloride 0.9 % 100 mL IVPB (has no administration in time range)    Followed by  remdesivir 100 mg in sodium chloride 0.9 % 100 mL IVPB (has no administration in time range)  arformoterol (BROVANA) nebulizer solution 15 mcg (15 mcg Nebulization Given 06/25/23 0813)  budesonide (PULMICORT) nebulizer solution 0.5 mg (0.5 mg Nebulization Given 06/25/23 0813)  ipratropium-albuterol (DUONEB) 0.5-2.5 (3) MG/3ML nebulizer solution 3 mL (3 mLs Nebulization Given 06/25/23 0813)  methylPREDNISolone sodium succinate (SOLU-MEDROL) 125 mg/2 mL injection 125 mg (has no administration in time range)  methylPREDNISolone sodium succinate (SOLU-MEDROL) 125 mg/2 mL injection 125 mg (125 mg Intravenous Given 06/24/23 1924)    Mobility Walks "very little"     Focused Assessments    R Recommendations: See Admitting Provider Note  Report given to:   Additional Notes:

## 2023-06-25 NOTE — Hospital Course (Addendum)
62 year old male with a history of COPD, chronic respiratory failure 3 L, HFpEF, stage IIIb non-small cell lung cancer, tobacco abuse, hypertension presenting with 3 days of shortness of breath that has significantly worsened on 06/24/2023.  He denied any fevers, chills, headache, neck pain, nausea, vomiting or diarrhea or abdominal pain.  He had some substernal chest pain particularly with coughing.  He denies any hemoptysis.  He denies any diarrhea, hematochezia, melena, abdominal pain.  He has increased use of his nebulizer machine without much improvement.  As result he presented for further evaluation and treatment.  He continues to complain of his chronic right-sided upper abdominal and right lower chest pain. In the ED, the patient was afebrile hemodynamically stable with oxygen saturation of 95% on 6 L.  WBC 4.0, hemoglobin 1.9, platelets 1-40,000.  Sodium 139, potassium 4.6, bicarbonate 41, serum creatinine 0.60.  LFTs were unremarkable.  Chest x-ray showed bibasilar scarring and chronic interstitial markings.  Lactic acid 0.7>> 0.6.  COVID-19 PCR was positive.  EKG shows sinus rhythm without any ST-T wave change Regarding his lung cancer, he previously followed at [facility] and tx with pembrolizumab arm. Received 35 cycles 07/12/15 - 07/16/17.  Review of the medical record shows that his last office appointment was 07/19/2020.  The patient was recently hospitalized from 03/05/2023 to 03/09/2023 for acute on chronic respiratory failure secondary to COPD exacerbation.  He required BiPAP initially during that hospitalization but was weaned back to his usual 3 L at the time of discharge. In addition, the patient was also admitted from four 02/01/23 to 02/08/23 due to acute on chronic respiratory failure with hypoxia and hypercapnia secondary to COPD exacerbation.  During that hospitalization, the patient also required BiPAP.  He developed respiratory distress in the morning of 06/25/23 and he was moved to SDU and  placed on BiPAP.  He had difficulty tolerating BiPAP.  Fortunately, his sob improved, and the decision was to leave him off.  He remained stable off BiPAP although he has very low pulmonary reserve.

## 2023-06-25 NOTE — Progress Notes (Signed)
Responded to nursing call:  respiratory distress with drop in oxygen saturation into 80s.  Placed on 10L prior to my arrival with saturation back up to 100%.  Remains very sob.   Subjective: Pt states he breathing had gradually worsened again since arriving on the floor about 1-2 hours ago.  He denies f/c, cp, n/v/d.  Complains of his usual right abd pain.  Vitals:   06/25/23 0747 06/25/23 0800 06/25/23 0813 06/25/23 0911  BP:  (!) 147/79  (!) 147/81  Pulse:  76  83  Resp:    (!) 22  Temp: 97.9 F (36.6 C)   98 F (36.7 C)  TempSrc: Oral   Oral  SpO2:  97% 96% 100%   CV--RRR Lung--bibasilar rales.  bilateral wheeze Abd--soft+BS/NT   Assessment/Plan: Respiratory distress/Acute on chronic resp failure with hypoxia & hypercarbia--COVID-19 -move to SDU -place on BiPAP -VBG -repeat CXR -morphine 2 mg IV x 1 -EKG -troponins -D-dimer -LDH -PCT <0.10 -received solumedrol 125 mg at  0834 this morning>>continue current dosing -continue brovana,pulmicort,duonebs  Catarina Hartshorn, DO      Catarina Hartshorn, DO [facility]

## 2023-06-25 NOTE — Plan of Care (Signed)
  Problem: Activity: Goal: Ability to tolerate increased activity will improve Outcome: Progressing   

## 2023-06-25 NOTE — Plan of Care (Signed)
  Problem: Activity: Goal: Ability to tolerate increased activity will improve Outcome: Progressing   Problem: Clinical Measurements: Goal: Ability to maintain a body temperature in the normal range will improve Outcome: Progressing   Problem: Respiratory: Goal: Ability to maintain adequate ventilation will improve Outcome: Progressing Goal: Ability to maintain a clear airway will improve Outcome: Progressing   Problem: Education: Goal: Knowledge of General Education information will improve Description: Including pain rating scale, medication(s)/side effects and non-pharmacologic comfort measures Outcome: Progressing   Problem: Health Behavior/Discharge Planning: Goal: Ability to manage health-related needs will improve Outcome: Progressing   Problem: Clinical Measurements: Goal: Ability to maintain clinical measurements within normal limits will improve Outcome: Progressing Goal: Will remain free from infection Outcome: Progressing Goal: Diagnostic test results will improve Outcome: Progressing Goal: Respiratory complications will improve Outcome: Progressing Goal: Cardiovascular complication will be avoided Outcome: Progressing   Problem: Activity: Goal: Risk for activity intolerance will decrease Outcome: Progressing   Problem: Nutrition: Goal: Adequate nutrition will be maintained Outcome: Progressing   Problem: Coping: Goal: Level of anxiety will decrease Outcome: Progressing   Problem: Elimination: Goal: Will not experience complications related to bowel motility Outcome: Progressing Goal: Will not experience complications related to urinary retention Outcome: Progressing   Problem: Pain Managment: Goal: General experience of comfort will improve Outcome: Progressing   Problem: Safety: Goal: Ability to remain free from injury will improve Outcome: Progressing   Problem: Skin Integrity: Goal: Risk for impaired skin integrity will decrease Outcome:  Progressing   Problem: Education: Goal: Knowledge of risk factors and measures for prevention of condition will improve Outcome: Progressing   Problem: Coping: Goal: Psychosocial and spiritual needs will be supported Outcome: Progressing   Problem: Respiratory: Goal: Will maintain a patent airway Outcome: Progressing Goal: Complications related to the disease process, condition or treatment will be avoided or minimized Outcome: Progressing   

## 2023-06-25 NOTE — Progress Notes (Addendum)
Lab called critical result on patient's venous blood gas. pCO2 102. Patient had been on bipap since arrival to SDU but has been adamant about taking it off despite only wearing the bipap for an hour or so. This RN provided education about the use of the bipap as well as respiratory therapy. Patient continues to be adamant about needing to get the bipap off. Bipap removed, patient placed on [place] at 6L of oxygen. Patient sitting on side of bed. Dr. Arbutus Leas made aware.

## 2023-06-26 DIAGNOSIS — J9622 Acute and chronic respiratory failure with hypercapnia: Secondary | ICD-10-CM | POA: Diagnosis not present

## 2023-06-26 DIAGNOSIS — U071 COVID-19: Secondary | ICD-10-CM | POA: Diagnosis not present

## 2023-06-26 DIAGNOSIS — J9621 Acute and chronic respiratory failure with hypoxia: Secondary | ICD-10-CM | POA: Diagnosis not present

## 2023-06-26 DIAGNOSIS — F17200 Nicotine dependence, unspecified, uncomplicated: Secondary | ICD-10-CM | POA: Diagnosis not present

## 2023-06-26 LAB — BASIC METABOLIC PANEL
Anion gap: 5 (ref 5–15)
BUN: 9 mg/dL (ref 8–23)
CO2: 41 mmol/L — ABNORMAL HIGH (ref 22–32)
Calcium: 8.6 mg/dL — ABNORMAL LOW (ref 8.9–10.3)
Chloride: 93 mmol/L — ABNORMAL LOW (ref 98–111)
Creatinine, Ser: 0.49 mg/dL — ABNORMAL LOW (ref 0.61–1.24)
GFR, Estimated: >60 mL/min (ref >60)
Glucose, Bld: 87 mg/dL (ref 70–99)
Potassium: 4.1 mmol/L (ref 3.5–5.1)
Sodium: 139 mmol/L (ref 135–145)

## 2023-06-26 LAB — CBC
HCT: 38.4 % — ABNORMAL LOW (ref 39.0–52.0)
Hemoglobin: 11.8 g/dL — ABNORMAL LOW (ref 13.0–17.0)
MCH: 29.4 pg (ref 26.0–34.0)
MCHC: 30.7 g/dL (ref 30.0–36.0)
MCV: 95.5 fL (ref 80.0–100.0)
Platelets: 151 10*3/uL (ref 150–400)
RBC: 4.02 MIL/uL — ABNORMAL LOW (ref 4.22–5.81)
RDW: 11.6 % (ref 11.5–15.5)
WBC: 4.2 10*3/uL (ref 4.0–10.5)
nRBC: 0 % (ref 0.0–0.2)

## 2023-06-26 LAB — MAGNESIUM: Magnesium: 1.9 mg/dL (ref 1.7–2.4)

## 2023-06-26 MED ORDER — SALINE SPRAY 0.65 % NA SOLN
1.0000 | NASAL | Status: DC | PRN
  Administered 2023-06-26: 1 via NASAL
  Filled 2023-06-26: qty 44

## 2023-06-26 MED ORDER — OXYCODONE HCL 5 MG PO TABS
15.0000 mg | ORAL_TABLET | ORAL | Status: DC | PRN
  Administered 2023-06-26 – 2023-06-30 (×6): 15 mg via ORAL
  Filled 2023-06-26 (×6): qty 3

## 2023-06-26 MED ORDER — HYDROCODONE BIT-HOMATROP MBR 5-1.5 MG/5ML PO SOLN
5.0000 mL | ORAL | Status: DC | PRN
  Administered 2023-06-26 – 2023-06-30 (×4): 5 mL via ORAL
  Filled 2023-06-26 (×4): qty 5

## 2023-06-26 MED ORDER — CLONAZEPAM 0.5 MG PO TABS
1.0000 mg | ORAL_TABLET | Freq: Two times a day (BID) | ORAL | Status: DC
  Administered 2023-06-26 – 2023-07-01 (×10): 1 mg via ORAL
  Filled 2023-06-26 (×10): qty 2

## 2023-06-26 NOTE — Progress Notes (Signed)
PROGRESS NOTE  CORNELIOUS Marsh UUV:[id number] DOB: February 06, 1961 DOA: 06/24/2023 PCP: [name], NP  Brief History:  62 year old male with a history of COPD, chronic respiratory failure 3 L, HFpEF, stage IIIb non-small cell lung cancer, tobacco abuse, hypertension presenting with 3 days of shortness of breath that has significantly worsened on 06/24/2023.  He denied any fevers, chills, headache, neck pain, nausea, vomiting or diarrhea or abdominal pain.  He had some substernal chest pain particularly with coughing.  He denies any hemoptysis.  He denies any diarrhea, hematochezia, melena, abdominal pain.  He has increased use of his nebulizer machine without much improvement.  As result he presented for further evaluation and treatment.  He continues to complain of his chronic right-sided upper abdominal and right lower chest pain. In the ED, the patient was afebrile hemodynamically stable with oxygen saturation of 95% on 6 L.  WBC 4.0, hemoglobin 1.9, platelets 1-40,000.  Sodium 139, potassium 4.6, bicarbonate 41, serum creatinine 0.60.  LFTs were unremarkable.  Chest x-ray showed bibasilar scarring and chronic interstitial markings.  Lactic acid 0.7>> 0.6.  COVID-19 PCR was positive.  EKG shows sinus rhythm without any ST-T wave change Regarding his lung cancer, he previously followed at [facility] and tx with pembrolizumab arm. Received 35 cycles 07/12/15 - 07/16/17.  Review of the medical record shows that his last office appointment was 07/19/2020.  The patient was recently hospitalized from 03/05/2023 to 03/09/2023 for acute on chronic respiratory failure secondary to COPD exacerbation.  He required BiPAP initially during that hospitalization but was weaned back to his usual 3 L at the time of discharge. In addition, the patient was also admitted from four 02/01/23 to 02/08/23 due to acute on chronic respiratory failure with hypoxia and hypercapnia secondary to COPD exacerbation.  During that  hospitalization, the patient also required BiPAP.  He developed respiratory distress in the morning of 06/25/23 and he was moved to SDU and placed on BiPAP.  He had difficulty tolerating BiPAP.  Fortunately, his sob improved, and the decision was to leave him off.  He remained stable off BiPAP although he has very low pulmonary reserve.    Assessment/Plan:  Acute on chronic respiratory failure with hypoxia and hypercarbia -Chronically on 3 L at home -placed on BiPAP 8/26>>now off  -on 5L -Secondary COPD exacerbation -Wean oxygen back to baseline for saturation 88-92% -CTA chest--no PE.  1.9x1.8 cm cavitary nodule in ELL.  Bibasialr patchy nodular air space disease   COVID-19 infection -plan remdesivir x 3 days -IV solumedrol -continue bronchodilators -PCT <0.10 -check CRP   COPD exacerbation -Start Pulmicort -Continue Solu-Medrol -Start Brovana -Continue DuoNebs  RLL Cavitary nodule -concerned about recurrence of his lung cancer -patient made aware of finding -discussed the importance of him following up with med onc at [facility] -he has been lost to follow up for 3 years   Malignancy related pain -PDMP queried--receives monthly oxycodone 15 mg  #150 from K. Milinda Cave, DNP--last refill 8/191/24 -also receives clonazepam 1 mg, #60, last refill 06/18/23 -EKG--neg for ischemic changes -pt has had chronic Right lower chest, RUQ pain since his surgery 12/04/17 -10/20/19 CT chest--Scattered small pulmonary cysts. Diffuse bronchial wall thickening suggests airway predominant COPD. Fusiform bronchiectasis within both lower lobes, with areas of mucus plugging again noted in the periphery of the right lower lobe. 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, mediastinal mass excision and lymph node dissection with nerve sheath tumor resection 12/04/2017 at [facility] -follow up cardiothoracic at [facility]   Essential hypertension -BP remains controlled   Medication induced Hyperglycemia/Impaired glucose tolerance -12/12/19 hemoglobin A1c--6.4 -04/12/20 A1C--6.6 -05/02/21 A1C 6.0 -06/09/22 A1C--5.2 -start novolog ISS -elevated CBG partly due to steroids -carb modified diet   Tobacco abuse -cessation discussed             Family Communication: no  Family at bedside   Consultants:  none   Code Status:  FULL    DVT Prophylaxis:   Parks Lovenox     Procedures: As Listed in Progress Note Above   Antibiotics: None    Subjective: Pt state breathing is better than yesterday but gets sob with minimal exertion.  Denies f/c, n/v/d, cp.  Complains of his chronic right abd pain  Objective: Vitals:   06/26/23 0800 06/26/23 1200 06/26/23 1317 06/26/23 1600  BP: (!) 154/73 (!) 146/77  (!) 146/81  Pulse: 82 75  69  Resp: 19 18  17   Temp:      TempSrc:      SpO2: 95% 98% 97% 96%  Weight:      Height:        Intake/Output Summary (Last 24 hours) at 06/26/2023 1716 Last data filed at 06/26/2023 1700 Gross per 24 hour  Intake 580 ml  Output 2200 ml  Net -1620 ml   Weight change:  Exam:  General:  Pt is alert, follows commands appropriately, not in acute distress HEENT: No icterus, No thrush, No neck mass, [place]/AT Cardiovascular: RRR, S1/S2, no rubs, no gallops Respiratory: bibasilar rales.  Bibasilar wheeze Abdomen: Soft/+BS, non tender, non distended, no guarding Extremities: No edema, No lymphangitis, No petechiae, No rashes, no synovitis   Data Reviewed: I have personally reviewed following labs and imaging studies Basic Metabolic Panel: Recent Labs  Lab 06/24/23 1924 06/25/23 0407 06/26/23 0511  NA 138 139 139  K 3.7 4.6 4.1  CL 91* 91* 93*  CO2 43* 41* 41*  GLUCOSE 137* 179* 87  BUN 7*  7* 9  CREATININE 0.50* 0.60* 0.49*  CALCIUM 8.4* 8.5* 8.6*  MG 2.0 2.0 1.9  PHOS  --  3.3  --    Liver Function Tests: Recent Labs  Lab 06/25/23 0407  AST 17  ALT 14  ALKPHOS 41  BILITOT 0.3  PROT 6.1*  ALBUMIN 3.0*   No results for input(s): "LIPASE", "AMYLASE" in the last 168 hours. No results for input(s): "AMMONIA" in the last 168 hours. Coagulation Profile: Recent Labs  Lab 06/24/23 1924  INR 0.9   CBC: Recent Labs  Lab 06/24/23 1924 06/25/23 0407 06/26/23 0511  WBC 4.0 3.1* 4.2  NEUTROABS 1.9  --   --   HGB 13.0 11.9* 11.8*  HCT 42.0 39.7 38.4*  MCV 95.2 97.1 95.5  PLT 136* 140* 151   Cardiac Enzymes: No results for input(s): "CKTOTAL", "CKMB", "CKMBINDEX", "TROPONINI" in the last 168 hours. BNP: Invalid input(s): "POCBNP" CBG: Recent Labs  Lab 06/25/23 1321  GLUCAP 129*   HbA1C: No results for input(s): "HGBA1C" in the last 72 hours. Urine analysis:    Component Value Date/Time   COLORURINE YELLOW 03/06/2023 1130   APPEARANCEUR HAZY (A) 03/06/2023 1130   LABSPEC 1.023 03/06/2023 1130   PHURINE 5.0 03/06/2023 1130  GLUCOSEU 50 (A) 03/06/2023 1130   HGBUR NEGATIVE 03/06/2023 1130   BILIRUBINUR NEGATIVE 03/06/2023 1130   KETONESUR NEGATIVE 03/06/2023 1130   PROTEINUR 100 (A) 03/06/2023 1130   UROBILINOGEN 0.2 10/21/2013 0200   NITRITE NEGATIVE 03/06/2023 1130   LEUKOCYTESUR NEGATIVE 03/06/2023 1130   Sepsis Labs: @LABRCNTIP (procalcitonin:4,lacticidven:4) ) Recent Results (from the past 240 hour(s))  Blood Culture (routine x 2)     Status: None (Preliminary result)   Collection Time: 06/24/23  7:23 PM   Specimen: BLOOD LEFT HAND  Result Value Ref Range Status   Specimen Description BLOOD LEFT HAND BLOOD  Final   Special Requests   Final    BOTTLES DRAWN AEROBIC AND ANAEROBIC Blood Culture adequate volume   Culture   Final    NO GROWTH 2 DAYS Performed at [facility], [address], [place], [place] [address]    Report Status  PENDING  Incomplete  Resp panel by RT-PCR (RSV, Flu A&B, Covid) Anterior Nasal Swab     Status: Abnormal   Collection Time: 06/24/23  7:29 PM   Specimen: Anterior Nasal Swab  Result Value Ref Range Status   SARS Coronavirus 2 by RT PCR POSITIVE (A) NEGATIVE Final    Comment: (NOTE) SARS-CoV-2 target nucleic acids are DETECTED.  The SARS-CoV-2 RNA is generally detectable in upper respiratory specimens during the acute phase of infection. Positive results are indicative of the presence of the identified virus, but do not rule out bacterial infection or co-infection with other pathogens not detected by the test. Clinical correlation with patient history and other diagnostic information is necessary to determine patient infection status. The expected result is Negative.  Fact Sheet for Patients: BloggerCourse.com  Fact Sheet for Healthcare Providers: SeriousBroker.it  This test is not yet approved or cleared by the [place] FDA and  has been authorized for detection and/or diagnosis of SARS-CoV-2 by FDA under an Emergency Use Authorization (EUA).  This EUA will remain in effect (meaning this test can be used) for the duration of  the COVID-19 declaration under Section 564(b)(1) of the A ct, 21 U.S.C. section 360bbb-3(b)(1), unless the authorization is terminated or revoked sooner.     Influenza A by PCR NEGATIVE NEGATIVE Final   Influenza B by PCR NEGATIVE NEGATIVE Final    Comment: (NOTE) The Xpert Xpress SARS-CoV-2/FLU/RSV plus assay is intended as an aid in the diagnosis of influenza from Nasopharyngeal swab specimens and should not be used as a sole basis for treatment. Nasal washings and aspirates are unacceptable for Xpert Xpress SARS-CoV-2/FLU/RSV testing.  Fact Sheet for Patients: BloggerCourse.com  Fact Sheet for Healthcare Providers: SeriousBroker.it  This test  is not yet approved or cleared by the [place] FDA and has been authorized for detection and/or diagnosis of SARS-CoV-2 by FDA under an Emergency Use Authorization (EUA). This EUA will remain in effect (meaning this test can be used) for the duration of the COVID-19 declaration under Section 564(b)(1) of the Act, 21 U.S.C. section 360bbb-3(b)(1), unless the authorization is terminated or revoked.     Resp Syncytial Virus by PCR NEGATIVE NEGATIVE Final    Comment: (NOTE) Fact Sheet for Patients: BloggerCourse.com  Fact Sheet for Healthcare Providers: SeriousBroker.it  This test is not yet approved or cleared by the [place] FDA and has been authorized for detection and/or diagnosis of SARS-CoV-2 by FDA under an Emergency Use Authorization (EUA). This EUA will remain in effect (meaning this test can be used) for the duration of the COVID-19 declaration under  Section 564(b)(1) of the Act, 21 U.S.C. section 360bbb-3(b)(1), unless the authorization is terminated or revoked.  Performed at [facility], [address], [place], [place] [address]   Blood Culture (routine x 2)     Status: None (Preliminary result)   Collection Time: 06/24/23  7:33 PM   Specimen: BLOOD LEFT HAND  Result Value Ref Range Status   Specimen Description BLOOD LEFT HAND BLOOD  Final   Special Requests   Final    BOTTLES DRAWN AEROBIC AND ANAEROBIC Blood Culture results may not be optimal due to an excessive volume of blood received in culture bottles   Culture   Final    NO GROWTH 2 DAYS Performed at [facility], [address], [place], [place] [address]    Report Status PENDING  Incomplete  MRSA Next Gen by PCR, Nasal     Status: None   Collection Time: 06/25/23  1:34 PM   Specimen: Nasal Mucosa; Nasal Swab  Result Value Ref Range Status   MRSA by PCR Next Gen NOT DETECTED NOT DETECTED Final    Comment: (NOTE) The GeneXpert MRSA Assay (FDA  approved for NASAL specimens only), is one component of a comprehensive MRSA colonization surveillance program. It is not intended to diagnose MRSA infection nor to guide or monitor treatment for MRSA infections. Test performance is not FDA approved in patients less than 37 years old. Performed at [facility], [address], [place], [place] [address]      Scheduled Meds:  arformoterol  15 mcg Nebulization BID   budesonide (PULMICORT) nebulizer solution  0.5 mg Nebulization BID   Chlorhexidine Gluconate Cloth  6 each Topical Daily   dextromethorphan-guaiFENesin  1 tablet Oral BID   enoxaparin (LOVENOX) injection  40 mg Subcutaneous Q24H   ipratropium-albuterol  3 mL Nebulization Q6H   methylPREDNISolone (SOLU-MEDROL) injection  125 mg Intravenous Daily   pantoprazole (PROTONIX) IV  40 mg Intravenous Q24H   Continuous Infusions:  remdesivir 100 mg in sodium chloride 0.9 % 100 mL IVPB Stopped (06/26/23 1054)    Procedures/Studies: CT Angio Chest Pulmonary Embolism (PE) W or WO Contrast  Result Date: 06/25/2023 CLINICAL DATA:  62 year old male with positive D-dimer and COVID-19 infection. History of lung cancer. EXAM: CT ANGIOGRAPHY CHEST WITH CONTRAST TECHNIQUE: Multidetector CT imaging of the chest was performed using the standard protocol during bolus administration of intravenous contrast. Multiplanar CT image reconstructions and MIPs were obtained to evaluate the vascular anatomy. RADIATION DOSE REDUCTION: This exam was performed according to the departmental dose-optimization program which includes automated exposure control, adjustment of the mA and/or kV according to patient size and/or use of iterative reconstruction technique. CONTRAST:  75mL OMNIPAQUE IOHEXOL 350 MG/ML SOLN COMPARISON:  10/06/2020 FINDINGS: Cardiovascular: The heart size is normal. No substantial pericardial effusion. Coronary artery calcification is evident. Mild atherosclerotic calcification is noted in the wall  of the thoracic aorta. There is no filling defect within the opacified pulmonary arteries to suggest the presence of an acute pulmonary embolus. Mediastinum/Nodes: No mediastinal lymphadenopathy. There is no hilar lymphadenopathy. The esophagus has normal imaging features. There is no axillary lymphadenopathy. Lungs/Pleura: Centrilobular and paraseptal emphysema evident. 9 mm right upper lobe ground-glass opacity identified on image 44/8. 1.9 x 1.8 cm irregular potentially cavitary nodule identified right lower lobe on image 115/80. Bronchial wall thickening noted bilaterally with areas of cylindrical bronchiectasis in all lobes of both lungs. Small peripheral airway impaction noted lower lobes bilaterally, left greater than right. Peripheral patchy nodular airspace disease  is seen in the dependent lung bases bilaterally with demonstrable branching of the nodule opacity in the right lower lobe. No pleural effusion. Upper Abdomen: Small cyst noted upper pole left kidney. No followup imaging is recommended. Thickening of both adrenal glands evident without a discrete nodule or mass. Musculoskeletal: No worrisome lytic or sclerotic osseous abnormality. Review of the MIP images confirms the above findings. IMPRESSION: 1. No CT evidence for acute pulmonary embolus. 2. 1.9 x 1.8 cm irregular potentially cavitary nodule in the right lower lobe. While this could be infectious/inflammatory, imaging features are concerning for malignancy. 3. 9 mm right upper lobe ground-glass opacity. Initial follow-up with CT at 6 months is recommended to confirm persistence. If persistent, repeat CT is recommended every 2 years until 5 years of stability has been established. This recommendation follows the consensus statement: Guidelines for Management of Incidental Pulmonary Nodules Detected on CT Images: From the Fleischner Society 2017; Radiology 2017; 284:228-243. 4. Bronchial wall thickening with areas of cylindrical bronchiectasis in  all lobes of both lungs. Peripheral patchy nodular airspace disease in the dependent lung bases bilaterally with demonstrable branching of the nodule opacity in the right lower lobe. Imaging features suggest atypical infection. 5. Aortic Atherosclerosis (ICD10-I70.0) and Emphysema (ICD10-J43.9). Electronically Signed   By: Kennith Center M.D.   On: 06/25/2023 18:49   DG Chest Port 1 View  Result Date: 06/25/2023 CLINICAL DATA:  Respiratory distress EXAM: PORTABLE CHEST 1 VIEW COMPARISON:  Chest radiograph 1 day prior FINDINGS: The cardiomediastinal silhouette is stable. Nodular opacity is again seen in the right lower lobe, unchanged. There is no new or worsening focal airspace consolidation. There is no pulmonary edema. There is no pleural effusion or pneumothorax. There is no acute osseous abnormality. IMPRESSION: 1. Unchanged nodular opacity in the right lower lobe. CT chest is recommended for further evaluation. 2. No new or worsening focal airspace disease. Electronically Signed   By: Lesia Hausen M.D.   On: 06/25/2023 14:56   DG Chest Port 1 View  Result Date: 06/24/2023 CLINICAL DATA:  Shortness of breath. EXAM: PORTABLE CHEST 1 VIEW COMPARISON:  May 06, 2023 FINDINGS: The heart size and mediastinal contours are within normal limits. The lungs are hyperinflated. Mild, diffuse, chronic appearing increased interstitial lung markings are noted. Mild scarring and/or atelectasis is seen within bilateral lung bases. A 5 mm nodular opacity is also seen overlying the right lung base. No pleural effusion or pneumothorax is identified. Multilevel degenerative changes seen throughout the thoracic spine. IMPRESSION: 1. Mild bibasilar scarring and/or atelectasis. 2. Findings which may represent a small right basilar lung nodule. Correlation with nonemergent chest CT is recommended. Electronically Signed   By: Aram Candela M.D.   On: 06/24/2023 20:21    Catarina Hartshorn, DO  [facility]  If 7PM-7AM, please  contact night-coverage [email or web address] Password [facility] 06/26/2023, 5:16 PM   LOS: 2 days

## 2023-06-26 NOTE — Plan of Care (Signed)
  Problem: Activity: Goal: Ability to tolerate increased activity will improve Outcome: Progressing   Problem: Clinical Measurements: Goal: Ability to maintain a body temperature in the normal range will improve Outcome: Progressing   Problem: Respiratory: Goal: Ability to maintain adequate ventilation will improve Outcome: Progressing Goal: Ability to maintain a clear airway will improve Outcome: Progressing   Problem: Education: Goal: Knowledge of General Education information will improve Description: Including pain rating scale, medication(s)/side effects and non-pharmacologic comfort measures Outcome: Progressing   Problem: Health Behavior/Discharge Planning: Goal: Ability to manage health-related needs will improve Outcome: Progressing   Problem: Clinical Measurements: Goal: Ability to maintain clinical measurements within normal limits will improve Outcome: Progressing Goal: Will remain free from infection Outcome: Progressing Goal: Diagnostic test results will improve Outcome: Progressing Goal: Respiratory complications will improve Outcome: Progressing Goal: Cardiovascular complication will be avoided Outcome: Progressing   Problem: Activity: Goal: Risk for activity intolerance will decrease Outcome: Progressing   Problem: Nutrition: Goal: Adequate nutrition will be maintained Outcome: Progressing   Problem: Coping: Goal: Level of anxiety will decrease Outcome: Progressing   Problem: Elimination: Goal: Will not experience complications related to bowel motility Outcome: Progressing Goal: Will not experience complications related to urinary retention Outcome: Progressing   Problem: Pain Managment: Goal: General experience of comfort will improve Outcome: Progressing   Problem: Safety: Goal: Ability to remain free from injury will improve Outcome: Progressing   Problem: Skin Integrity: Goal: Risk for impaired skin integrity will decrease Outcome:  Progressing   Problem: Education: Goal: Knowledge of risk factors and measures for prevention of condition will improve Outcome: Progressing   Problem: Coping: Goal: Psychosocial and spiritual needs will be supported Outcome: Progressing   Problem: Respiratory: Goal: Will maintain a patent airway Outcome: Progressing Goal: Complications related to the disease process, condition or treatment will be avoided or minimized Outcome: Progressing   

## 2023-06-27 DIAGNOSIS — J9621 Acute and chronic respiratory failure with hypoxia: Secondary | ICD-10-CM | POA: Diagnosis not present

## 2023-06-27 LAB — LEGIONELLA PNEUMOPHILA SEROGP 1 UR AG: L. pneumophila Serogp 1 Ur Ag: NEGATIVE

## 2023-06-27 LAB — C-REACTIVE PROTEIN: CRP: 1.3 mg/dL — ABNORMAL HIGH (ref <1.0)

## 2023-06-27 LAB — HEMOGLOBIN A1C
Hgb A1c MFr Bld: 5.6 % (ref 4.8–5.6)
Mean Plasma Glucose: 114.02 mg/dL

## 2023-06-27 MED ORDER — METHYLPREDNISOLONE SODIUM SUCC 125 MG IJ SOLR: 80.0000 mg | Freq: Every day | INTRAMUSCULAR | Status: DC

## 2023-06-27 NOTE — Progress Notes (Signed)
[facility] PROGRESS NOTE  Miguel Marsh (DOB: 06-14-1961) YQI:[id number] PCP: [name], NP  Brief Narrative: 62 year old male with a history of COPD, chronic respiratory failure 3 L, HFpEF, stage IIIb, non-small cell lung cancer, tobacco abuse, hypertension presenting with 3 days of shortness of breath that has significantly worsened on 06/24/2023.  He denied any fevers, chills, headache, neck pain, nausea, vomiting or diarrhea or abdominal pain.  He had some substernal chest pain particularly with coughing.  He denies any hemoptysis.  He denies any diarrhea, hematochezia, melena, abdominal pain.  He has increased use of his nebulizer machine without much improvement.  As result he presented for further evaluation and treatment.  He continues to complain of his chronic right-sided upper abdominal and right lower chest pain. In the ED, the patient was afebrile hemodynamically stable with oxygen saturation of 95% on 6 L.  WBC 4.0, hemoglobin 1.9, platelets 1-40,000.  Sodium 139, potassium 4.6, bicarbonate 41, serum creatinine 0.60.  LFTs were unremarkable.  Chest x-ray showed bibasilar scarring and chronic interstitial markings.  Lactic acid 0.7>> 0.6.  COVID-19 PCR was positive.  EKG shows sinus rhythm without any ST-T wave change Regarding his lung cancer, he previously followed at [facility] and tx with pembrolizumab arm. Received 35 cycles 07/12/15 - 07/16/17.  Review of the medical record shows that his last office appointment was 07/19/2020.   The patient was recently hospitalized from 03/05/2023 to 03/09/2023 for acute on chronic respiratory failure secondary to COPD exacerbation.  He required BiPAP initially during that hospitalization but was weaned back to his usual 3 L at the time of discharge. In addition, the patient was also admitted from 02/01/23 to 02/08/23 due to acute on chronic respiratory failure with hypoxia and hypercapnia secondary to COPD exacerbation.  During that  hospitalization, the patient also required BiPAP.   He developed respiratory distress in the morning of 06/25/23 and he was moved to SDU and placed on BiPAP.  He had difficulty tolerating BiPAP.  Fortunately, his sob improved, and the decision was to leave him off.  He remained stable off BiPAP although he has very low pulmonary reserve. We are treating with remdesivir, steroids, and nebulized therapies.    Subjective: Breathing better, still with cough and right lower chest/RUQ abd pain with deep breathing. No midchest pain. Eating ok. No other complaints.   Objective: BP 137/67   Pulse 76   Temp 98.1 F (36.7 C) (Oral)   Resp (!) 22   Ht 5\' 6"  (1.676 m)   Wt 68.2 kg   SpO2 95%   BMI 24.27 kg/m   Gen: No distress, chronically ill-appearing Pulm: Coarse bilaterally, diminished, tachypneic  CV: RRR, no MRG or edema GI: Soft, NT, ND, +BS  Neuro: Alert and oriented. No new focal deficits. Ext: Warm, no deformities. Skin: No acute rashes, lesions or ulcers on visualized skin   Assessment & Plan: Acute on chronic respiratory failure with hypoxia and hypercarbia: No PE on CTA chest, +opacities consistent with covid pneumonia. PCT neg, MRSA PCR neg, BNP 35, LDH 118, troponin nl x2,  - Continue weaning O2 to home 3L, currently requiring 5L O2 at rest. No longer on BiPAP.  - Tx covid-19, AECOPD as below  COVID-19 pneumonia:  - Complete remdesivir x3 days (8/26 - 8/28).  - Maintain airborne/contact isolation x10 days from positive test (8/25) per protocol.  - Antitussives.    COPD exacerbation - Continue budesonide/arformoterol - Continue scheduled duonebs and prn albuterol.  - CRP 1.3.  Continue steroids mostly for AECOPD indication.     RLL cavitary nodule, history of NSCLC: 9mm RUL GGO, 1.9x1.8cm irregular potentially cavitary RLL nodule on CTA 06/25/2023.  - Pt aware of concern for recurrence of lung CA, knows to follow up with oncology (established at [facility], initial Dx 2016, on  pembrolizumab Sept 2016 - Sept 2018, last seen 2021).     Steroid-induced hyperglycemia:  - No significant hyperglycemia currently (max was 179). Update HbA1c (last was 5.2% in 2023).   Chronic pain, anxiety: - Reordered home oxycodone and clonazepam as confirmed by PDMP review.  -PDMP queried--receives monthly oxycodone 15 mg  #150 from K. Milinda Cave, DNP--last refill 06/18/23 -also receives clonazepam 1 mg, #60, last refill 06/18/23     Nerve sheath tumor: Status post right  lobectomy, mediastinal mass excision and lymph node dissection with nerve sheath tumor resection 12/04/2017 at [facility] - Follow up cardiothoracic at [facility]   Essential hypertension -BP remains controlled   Tobacco abuse - Cessation counseling provided  Thrombocytopenia: Resolved, likely due to covid-19 infection.  Tyrone Nine, MD [facility] [email or web address] 06/27/2023, 1:28 PM

## 2023-06-27 NOTE — Care Management Important Message (Signed)
Important Message  Patient Details  Name: Miguel Marsh MRN: [id number] Date of Birth: 10-03-1961   Medicare Important Message Given:  N/A - LOS <3 / Initial given by admissions     [name] 06/27/2023, 10:30 AM

## 2023-06-27 NOTE — Progress Notes (Signed)
Patient declines BIPAP, unit at bedside if needed. Order is for PRN. Patient states he is doing well and feels much better. Told him to call if he got into distress or felt like he needed to wear it.

## 2023-06-28 DIAGNOSIS — J9621 Acute and chronic respiratory failure with hypoxia: Secondary | ICD-10-CM | POA: Diagnosis not present

## 2023-06-28 MED ORDER — METHYLPREDNISOLONE SODIUM SUCC 40 MG IJ SOLR
40.0000 mg | Freq: Two times a day (BID) | INTRAMUSCULAR | Status: DC
  Administered 2023-06-28 – 2023-06-29 (×2): 40 mg via INTRAVENOUS
  Filled 2023-06-28 (×2): qty 1

## 2023-06-28 MED ORDER — METHYLPREDNISOLONE SODIUM SUCC 40 MG IJ SOLR
40.0000 mg | Freq: Every day | INTRAMUSCULAR | Status: DC
  Administered 2023-06-28: 40 mg via INTRAVENOUS
  Filled 2023-06-28: qty 1

## 2023-06-28 NOTE — Progress Notes (Signed)
[facility] PROGRESS NOTE  Miguel Marsh (DOB: [birth year]) FUX:[id number] PCP: [name], NP  Brief Narrative: 62 year old male with a history of COPD, chronic respiratory failure 3 L, HFpEF, stage IIIb, non-small cell lung cancer, tobacco abuse, hypertension presenting with 3 days of shortness of breath that has significantly worsened on 06/24/2023.  He denied any fevers, chills, headache, neck pain, nausea, vomiting or diarrhea or abdominal pain.  He had some substernal chest pain particularly with coughing.  He denies any hemoptysis.  He denies any diarrhea, hematochezia, melena, abdominal pain.  He has increased use of his nebulizer machine without much improvement.  As result he presented for further evaluation and treatment.  He continues to complain of his chronic right-sided upper abdominal and right lower chest pain. In the ED, the patient was afebrile hemodynamically stable with oxygen saturation of 95% on 6 L.  WBC 4.0, hemoglobin 1.9, platelets 1-40,000.  Sodium 139, potassium 4.6, bicarbonate 41, serum creatinine 0.60.  LFTs were unremarkable.  Chest x-ray showed bibasilar scarring and chronic interstitial markings.  Lactic acid 0.7>> 0.6.  COVID-19 PCR was positive.  EKG shows sinus rhythm without any ST-T wave change Regarding his lung cancer, he previously followed at [facility] and tx with pembrolizumab arm. Received 35 cycles 07/12/15 - 07/16/17.  Review of the medical record shows that his last office appointment was 07/19/2020.   The patient was recently hospitalized from 03/05/2023 to 03/09/2023 for acute on chronic respiratory failure secondary to COPD exacerbation.  He required BiPAP initially during that hospitalization but was weaned back to his usual 3 L at the time of discharge. In addition, the patient was also admitted from 02/01/23 to 02/08/23 due to acute on chronic respiratory failure with hypoxia and hypercapnia secondary to COPD exacerbation.  During that  hospitalization, the patient also required BiPAP.   He developed respiratory distress in the morning of 06/25/23 and he was moved to SDU and placed on BiPAP.  He had difficulty tolerating BiPAP.  Fortunately, his sob improved, and the decision was to leave him off.  He remained stable off BiPAP although he has very low pulmonary reserve. Treated with remdesivir, steroids, and nebulized therapies. Respiratory status has stabilized. Transferred to floor 8/29.  Subjective: Says he feels a lot better, but feeling weak diffusely. Shortness of breath has not returned to his baseline, but is much better. Improved with breathing treatments. Has not needed/wanted BiPAP.  Objective: BP 111/60   Pulse 68   Temp 97.8 F (36.6 C) (Oral)   Resp 16   Ht 5\' 6"  (1.676 m)   Wt 68.2 kg   SpO2 93%   BMI 24.27 kg/m   Gen: Chronically ill-appearing male in no acute distress Pulm: Expiratory rhonchi without inspiratory crackles, nonlabored, normal rate.   CV: RRR, no MRG or pitting edema GI: Soft, NT, ND, +BS  Neuro: Alert and oriented. No new focal deficits. Ext: Warm, no deformities. Skin: Scar on chest noted. No acute rashes, lesions or ulcers on visualized skin   Assessment & Plan: Acute on chronic respiratory failure with hypoxia and hypercarbia: No PE on CTA chest, +opacities consistent with covid pneumonia. PCT neg, MRSA PCR neg, BNP 35, LDH 118, troponin nl x2,  - Weaned to home 3L, has not required BiPAP >48 hrs, transfer to floor, initiate PT/OT. Telemetry reviewed showing only NSR. Will DC tele to facilitate mobility.  - Tx covid-19, AECOPD as below  COVID-19 pneumonia:  - Completed remdesivir x3 days (8/26 - 8/28).  -  Maintain airborne/contact isolation x10 days from positive test (8/25) per protocol.  - Antitussives/mucolytic   COPD exacerbation - Continue budesonide/arformoterol - Continue scheduled duonebs and prn albuterol.  - CRP 1.3. Continue steroids mostly for AECOPD indication.   Plan to deescalate as able, still needs at least 1mg /kg/day given persistent expiratory sounds.    RLL cavitary nodule, history of NSCLC: 9mm RUL GGO, 1.9x1.8cm irregular potentially cavitary RLL nodule on CTA 06/25/2023.  - Pt aware of concern for recurrence of lung CA, knows to follow up with oncology (established at [facility], initial Dx 2016, on pembrolizumab Sept 2016 - Sept 2018, last seen 2021).     Steroid-induced hyperglycemia:  - No significant hyperglycemia currently (max was 179). Update HbA1c (last was 5.2% in 2023).   Chronic pain, anxiety: - Reordered home oxycodone and clonazepam as confirmed by PDMP review.  -PDMP queried--receives monthly oxycodone 15 mg  #150 from K. 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  Tyrone Nine, MD [facility] [email or web address] 06/28/2023, 12:13 PM

## 2023-06-28 NOTE — Plan of Care (Signed)
  Problem: Activity: Goal: Ability to tolerate increased activity will improve Outcome: Progressing   Problem: Clinical Measurements: Goal: Ability to maintain clinical measurements within normal limits will improve Outcome: Progressing   Problem: Coping: Goal: Level of anxiety will decrease Outcome: Progressing   Problem: Pain Managment: Goal: General experience of comfort will improve Outcome: Progressing   Problem: Safety: Goal: Ability to remain free from injury will improve Outcome: Progressing

## 2023-06-29 DIAGNOSIS — J9621 Acute and chronic respiratory failure with hypoxia: Secondary | ICD-10-CM | POA: Diagnosis not present

## 2023-06-29 LAB — CULTURE, BLOOD (ROUTINE X 2)
Culture: NO GROWTH
Culture: NO GROWTH
Special Requests: ADEQUATE

## 2023-06-29 MED ORDER — IPRATROPIUM-ALBUTEROL 0.5-2.5 (3) MG/3ML IN SOLN
3.0000 mL | Freq: Three times a day (TID) | RESPIRATORY_TRACT | Status: DC
  Administered 2023-06-29 – 2023-07-01 (×6): 3 mL via RESPIRATORY_TRACT
  Filled 2023-06-29 (×7): qty 3

## 2023-06-29 MED ORDER — PANTOPRAZOLE SODIUM 40 MG PO TBEC
40.0000 mg | DELAYED_RELEASE_TABLET | Freq: Every day | ORAL | Status: DC
  Administered 2023-06-30 – 2023-07-01 (×2): 40 mg via ORAL
  Filled 2023-06-29 (×2): qty 1

## 2023-06-29 MED ORDER — PREDNISONE 20 MG PO TABS
40.0000 mg | ORAL_TABLET | Freq: Every day | ORAL | Status: DC
  Administered 2023-06-30: 40 mg via ORAL
  Filled 2023-06-29: qty 2

## 2023-06-29 NOTE — Evaluation (Signed)
Physical Therapy Evaluation Patient Details Name: Miguel Marsh MRN: [id number] DOB: [birth year] Today's Date: 06/29/2023  History of Present Illness  62 year old male with a history of COPD, chronic respiratory failure 3 L, HFpEF, stage IIIb, non-small cell lung cancer, tobacco abuse, hypertension presenting with 3 days of shortness of breath that has significantly worsened on 06/24/2023.  He denied any fevers, chills, headache, neck pain, nausea, vomiting or diarrhea or abdominal pain.  He had some substernal chest pain particularly with coughing.  He denies any hemoptysis.  He denies any diarrhea, hematochezia, melena, abdominal pain.  He has increased use of his nebulizer machine without much improvement.  As result he presented for further evaluation and treatment.  He continues to complain of his chronic right-sided upper abdominal and right lower chest pain.  In the ED, the patient was afebrile hemodynamically stable with oxygen saturation of 95% on 6 L.  WBC 4.0, hemoglobin 1.9, platelets 1-40,000.  Sodium 139, potassium 4.6, bicarbonate 41, serum creatinine 0.60.  LFTs were unremarkable.  Chest x-ray showed bibasilar scarring and chronic interstitial markings.  Lactic acid 0.7>> 0.6.  COVID-19 PCR was positive.  EKG shows sinus rhythm without any ST-T wave change  Regarding his lung cancer, he previously followed at [facility] and tx with pembrolizumab arm. Received 35 cycles 07/12/15 - 07/16/17  Clinical Impression  PT able to stand up walk 50 ft and get himself back in bed without assist.  Pt is not in need of skilled PT at this time.         If plan is discharge home, recommend the following: Assistance with cooking/housework;Assist for transportation   Can travel by private vehicle    yes    Equipment Recommendations None recommended by PT  Recommendations for Other Services    None pt has aide that comes in daily    Functional Status Assessment Patient has had a recent decline in their  functional status and demonstrates the ability to make significant improvements in function in a reasonable and predictable amount of time.     Precautions / Restrictions Precautions Precautions: None Restrictions Weight Bearing Restrictions: No      Mobility  Bed Mobility Overal bed mobility: Modified Independent                  Transfers Overall transfer level: Modified independent                      Ambulation/Gait Ambulation/Gait assistance: Modified independent (Device/Increase time) Gait Distance (Feet): 50 Feet Assistive device: None Gait Pattern/deviations: Decreased step length - right, Decreased step length - left   Gait velocity interpretation: 1.31 - 2.62 ft/sec, indicative of limited community ambulator   General Gait Details: with O2 on  Stairs            Wheelchair Mobility     Tilt Bed    Modified Rankin (Stroke Patients Only)          Pertinent Vitals/Pain Pain Assessment Pain Assessment: No/denies pain    Home Living Family/patient expects to be discharged to:: Private residence Living Arrangements: Alone Available Help at Discharge: Personal care attendant (7 days a week) Type of Home: Apartment Home Access: Elevator       Home Layout: One level Home Equipment: Rollator (4 wheels);Cane - single point Additional Comments: PT states he normally walks without his cane    Prior Function  Extremity/Trunk Assessment        Lower Extremity Assessment Lower Extremity Assessment: Overall WFL for tasks assessed       Communication   Communication Communication: No apparent difficulties Cueing Techniques: Verbal cues  Cognition Arousal: Alert Behavior During Therapy: WFL for tasks assessed/performed Overall Cognitive Status: Within Functional Limits for tasks assessed                                                 Assessment/Plan    PT Assessment  Patient does not need any further PT services  PT Problem List         PT Treatment Interventions      PT Goals (Current goals can be found in the Care Plan section)   PT does not need skilled PT             AM-PAC PT "6 Clicks" Mobility  Outcome Measure Help needed turning from your back to your side while in a flat bed without using bedrails?: None Help needed moving from lying on your back to sitting on the side of a flat bed without using bedrails?: None Help needed moving to and from a bed to a chair (including a wheelchair)?: None Help needed standing up from a chair using your arms (e.g., wheelchair or bedside chair)?: None Help needed to walk in hospital room?: None Help needed climbing 3-5 steps with a railing? : A Little 6 Click Score: 23    End of Session Equipment Utilized During Treatment: Oxygen Activity Tolerance: Patient tolerated treatment well Patient left: in bed Nurse Communication: Mobility status      Time: 8295-6213 PT Time Calculation (min) (ACUTE ONLY): 22 min   Charges:   PT Evaluation $PT Eval Low Complexity: 1 Low   PT General Charges $$ ACUTE PT VISIT: 1 Visit         Virgina Organ, PT CLT [phone]  06/29/2023, 3:57 PM

## 2023-06-29 NOTE — Progress Notes (Signed)
O2 sat 99 on 3/5 liters at rest. States at home he is on 3-3.5 liters.   Ambulating was 95% on 3.5 liters.  SOB on exertion with HR in 120's.  Sent Dr. Jarvis Newcomer message with these results.

## 2023-06-29 NOTE — Progress Notes (Signed)
[facility] PROGRESS NOTE  Miguel Marsh (DOB: Sep 26, 1961) DGL:[id number] PCP: [name], NP  Brief Narrative: 63 year old male with a history of COPD, chronic respiratory failure 3 L, HFpEF, stage IIIb, non-small cell lung cancer, tobacco abuse, hypertension presenting with 3 days of shortness of breath that has significantly worsened on 06/24/2023.  He denied any fevers, chills, headache, neck pain, nausea, vomiting or diarrhea or abdominal pain.  He had some substernal chest pain particularly with coughing.  He denies any hemoptysis.  He denies any diarrhea, hematochezia, melena, abdominal pain.  He has increased use of his nebulizer machine without much improvement.  As result he presented for further evaluation and treatment.  He continues to complain of his chronic right-sided upper abdominal and right lower chest pain. In the ED, the patient was afebrile hemodynamically stable with oxygen saturation of 95% on 6 L.  WBC 4.0, hemoglobin 1.9, platelets 1-40,000.  Sodium 139, potassium 4.6, bicarbonate 41, serum creatinine 0.60.  LFTs were unremarkable.  Chest x-ray showed bibasilar scarring and chronic interstitial markings.  Lactic acid 0.7>> 0.6.  COVID-19 PCR was positive.  EKG shows sinus rhythm without any ST-T wave change Regarding his lung cancer, he previously followed at [facility] and tx with pembrolizumab arm. Received 35 cycles 07/12/15 - 07/16/17.  Review of the medical record shows that his last office appointment was 07/19/2020.   The patient was recently hospitalized from 03/05/2023 to 03/09/2023 for acute on chronic respiratory failure secondary to COPD exacerbation.  He required BiPAP initially during that hospitalization but was weaned back to his usual 3 L at the time of discharge. In addition, the patient was also admitted from 02/01/23 to 02/08/23 due to acute on chronic respiratory failure with hypoxia and hypercapnia secondary to COPD exacerbation.  During that  hospitalization, the patient also required BiPAP.   He developed respiratory distress in the morning of 06/25/23 and he was moved to SDU and placed on BiPAP.  He had difficulty tolerating BiPAP.  Fortunately, his sob improved, and the decision was to leave him off.  He remained stable off BiPAP although he has very low pulmonary reserve. Treated with remdesivir, steroids, and nebulized therapies. Respiratory status has stabilized. Transferred to floor 8/29.  Subjective: Breathing overall much better, still seeing improvement with breathing treatments. Has not been getting up much and is doing so with a walker (usually ambulates unassisted) due to dyspnea.   Objective: BP 113/67 (BP Location: Left Arm)   Pulse 63   Temp 97.6 F (36.4 C)   Resp 18   Ht 5\' 6"  (1.676 m)   Wt 68.2 kg   SpO2 96%   BMI 24.27 kg/m   Gen: Pleasant male in no distress Pulm: Prolonged expiratory phase, no wheezes or crackles.   CV: RRR, no MRG or pitting edema GI: Soft, NT, ND, +BS  Neuro: Alert and oriented. No new focal deficits. Ext: Warm, no deformities. Skin: No rashes, lesions or ulcers on visualized skin   Assessment & Plan: Acute on chronic respiratory failure with hypoxia and hypercarbia: No PE on CTA chest, +opacities consistent with covid pneumonia. PCT neg, MRSA PCR neg, BNP 35, LDH 118, troponin nl x2,  - Weaned to home 3L. - Tx covid-19, AECOPD as below  COVID-19 pneumonia:  - Completed remdesivir x3 days (8/26 - 8/28).  - Maintain airborne/contact isolation x10 days from positive test (8/25) per protocol.  - Antitussives/mucolytic   COPD exacerbation - Continue budesonide/arformoterol - Continue scheduled duonebs and prn albuterol.  -  CRP 1.3. Exam is improving but still tight, deescalate to oral steroids and monitor 24 hours. Check ambulatory pulse oximetry.     RLL cavitary nodule, history of NSCLC: 9mm RUL GGO, 1.9x1.8cm irregular potentially cavitary RLL nodule on CTA 06/25/2023.  - Pt  aware of concern for recurrence of lung CA, knows to follow up with oncology (established at [facility], initial Dx 2016, on pembrolizumab Sept 2016 - Sept 2018, last seen 2021).     Steroid-induced hyperglycemia: HbA1c 5.6%.  - No significant hyperglycemia currently (max was 179).   Chronic pain, anxiety: - Reordered home oxycodone and clonazepam as confirmed by PDMP review.  -PDMP queried--receives monthly oxycodone 15 mg  #150 from K. 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  Tyrone Nine, MD [facility] [email or web address] 06/29/2023, 12:16 PM

## 2023-06-30 DIAGNOSIS — J9621 Acute and chronic respiratory failure with hypoxia: Secondary | ICD-10-CM | POA: Diagnosis not present

## 2023-06-30 MED ORDER — METHYLPREDNISOLONE SODIUM SUCC 40 MG IJ SOLR
40.0000 mg | Freq: Two times a day (BID) | INTRAMUSCULAR | Status: DC
  Administered 2023-06-30 – 2023-07-01 (×2): 40 mg via INTRAVENOUS
  Filled 2023-06-30 (×2): qty 1

## 2023-06-30 NOTE — Progress Notes (Signed)
[facility] PROGRESS NOTE  Miguel Marsh (DOB: [birth year]) QMV:[id number] PCP: [name], NP  Brief Narrative: 62 year old male with a history of COPD, chronic respiratory failure 3 L, HFpEF, stage IIIb, non-small cell lung cancer, tobacco abuse, hypertension presenting with 3 days of shortness of breath that has significantly worsened on 06/24/2023.  He denied any fevers, chills, headache, neck pain, nausea, vomiting or diarrhea or abdominal pain.  He had some substernal chest pain particularly with coughing.  He denies any hemoptysis.  He denies any diarrhea, hematochezia, melena, abdominal pain.  He has increased use of his nebulizer machine without much improvement.  As result he presented for further evaluation and treatment.  He continues to complain of his chronic right-sided upper abdominal and right lower chest pain. In the ED, the patient was afebrile hemodynamically stable with oxygen saturation of 95% on 6 L.  WBC 4.0, hemoglobin 1.9, platelets 1-40,000.  Sodium 139, potassium 4.6, bicarbonate 41, serum creatinine 0.60.  LFTs were unremarkable.  Chest x-ray showed bibasilar scarring and chronic interstitial markings.  Lactic acid 0.7>> 0.6.  COVID-19 PCR was positive.  EKG shows sinus rhythm without any ST-T wave change Regarding his lung cancer, he previously followed at [facility] and tx with pembrolizumab arm. Received 35 cycles 07/12/15 - 07/16/17.  Review of the medical record shows that his last office appointment was 07/19/2020.   The patient was recently hospitalized from 03/05/2023 to 03/09/2023 for acute on chronic respiratory failure secondary to COPD exacerbation.  He required BiPAP initially during that hospitalization but was weaned back to his usual 3 L at the time of discharge. In addition, the patient was also admitted from 02/01/23 to 02/08/23 due to acute on chronic respiratory failure with hypoxia and hypercapnia secondary to COPD exacerbation.  During that  hospitalization, the patient also required BiPAP.   He developed respiratory distress in the morning of 06/25/23 and he was moved to SDU and placed on BiPAP.  He had difficulty tolerating BiPAP.  Fortunately, his sob improved, and the decision was to leave him off.  He remained stable off BiPAP although he has very low pulmonary reserve. Treated with remdesivir, steroids, and nebulized therapies. Respiratory status has stabilized. Transferred to floor 8/29.  Subjective: Very short of breath with exertion, HR up into 120's with short distance walking. Wheezing improved but still comes on before breathing treatments are due. No chest pain.   Objective: BP 106/63 (BP Location: Left Arm)   Pulse 80   Temp 97.7 F (36.5 C)   Resp 20   Ht 5\' 6"  (1.676 m)   Wt 68.2 kg   SpO2 94%   BMI 24.27 kg/m   Gen: No distress Pulm: End-expiratory wheezing, prolonged expiratory phase, but good air movement and nonlabored.  CV: RRR GI: Soft, NT, ND, +BS  Neuro: Alert and oriented. No new focal deficits. Ext: Warm, no deformities. Skin: No rashes, lesions or ulcers on visualized skin   Assessment & Plan: Acute on chronic respiratory failure with hypoxia and hypercarbia: No PE on CTA chest, +opacities consistent with covid pneumonia. PCT neg, MRSA PCR neg, BNP 35, LDH 118, troponin nl x2,  - Weaned to home 3L, though dyspnea remains severe and not at his baseline. Reluctant to discharge with such limited pulmonary reserve, but we've discussed that his recovery will take a long time and will happen mostly outside the hospital. Agreed, since lung exam remains abnormal (albeit improved), to continue IV steroids an additional day and monitor closely. Continue  ambulating, IS, FV.  - Tx covid-19, AECOPD as below  COVID-19 pneumonia:  - Completed remdesivir x3 days (8/26 - 8/28).  - Maintain airborne/contact isolation x10 days from positive test (8/25) per protocol.  - Antitussives/mucolytic   COPD  exacerbation - Continue budesonide/arformoterol - Continue scheduled duonebs and prn albuterol.  - CRP 1.3. Exam is improving but still abnormal, restart IV steroids.    RLL cavitary nodule, history of NSCLC: 9mm RUL GGO, 1.9x1.8cm irregular potentially cavitary RLL nodule on CTA 06/25/2023.  - Pt aware of concern for recurrence of lung CA, knows to follow up with oncology (established at [facility], initial Dx 2016, on pembrolizumab Sept 2016 - Sept 2018, last seen 2021).     Steroid-induced hyperglycemia: HbA1c 5.6%.  - No significant hyperglycemia currently (max was 179).   Chronic pain, anxiety: - Reordered home oxycodone and clonazepam as confirmed by PDMP review.  -PDMP queried--receives monthly oxycodone 15 mg  #150 from K. 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  Tyrone Nine, MD [facility] [email or web address] 06/30/2023, 10:07 AM

## 2023-06-30 NOTE — Plan of Care (Signed)
  Problem: Activity: Goal: Ability to tolerate increased activity will improve Outcome: Progressing   Problem: Clinical Measurements: Goal: Ability to maintain a body temperature in the normal range will improve Outcome: Progressing   Problem: Respiratory: Goal: Ability to maintain adequate ventilation will improve Outcome: Progressing Goal: Ability to maintain a clear airway will improve Outcome: Progressing   Problem: Education: Goal: Knowledge of General Education information will improve Description: Including pain rating scale, medication(s)/side effects and non-pharmacologic comfort measures Outcome: Progressing   Problem: Health Behavior/Discharge Planning: Goal: Ability to manage health-related needs will improve Outcome: Progressing   Problem: Clinical Measurements: Goal: Ability to maintain clinical measurements within normal limits will improve Outcome: Progressing Goal: Will remain free from infection Outcome: Progressing Goal: Diagnostic test results will improve Outcome: Progressing Goal: Respiratory complications will improve Outcome: Progressing Goal: Cardiovascular complication will be avoided Outcome: Progressing   Problem: Activity: Goal: Risk for activity intolerance will decrease Outcome: Progressing   Problem: Nutrition: Goal: Adequate nutrition will be maintained Outcome: Progressing   Problem: Coping: Goal: Level of anxiety will decrease Outcome: Progressing   Problem: Elimination: Goal: Will not experience complications related to bowel motility Outcome: Progressing Goal: Will not experience complications related to urinary retention Outcome: Progressing   Problem: Pain Managment: Goal: General experience of comfort will improve Outcome: Progressing   Problem: Safety: Goal: Ability to remain free from injury will improve Outcome: Progressing   Problem: Skin Integrity: Goal: Risk for impaired skin integrity will decrease Outcome:  Progressing   Problem: Education: Goal: Knowledge of risk factors and measures for prevention of condition will improve Outcome: Progressing   Problem: Coping: Goal: Psychosocial and spiritual needs will be supported Outcome: Progressing   Problem: Respiratory: Goal: Will maintain a patent airway Outcome: Progressing Goal: Complications related to the disease process, condition or treatment will be avoided or minimized Outcome: Progressing   

## 2023-07-01 DIAGNOSIS — J9621 Acute and chronic respiratory failure with hypoxia: Secondary | ICD-10-CM | POA: Diagnosis not present

## 2023-07-01 LAB — CREATININE, SERUM
Creatinine, Ser: 0.57 mg/dL — ABNORMAL LOW (ref 0.61–1.24)
GFR, Estimated: >60 mL/min (ref >60)

## 2023-07-01 MED ORDER — PREDNISONE 20 MG PO TABS: ORAL_TABLET | ORAL | 0 refills | Status: DC

## 2023-07-01 MED ORDER — ALBUTEROL SULFATE (2.5 MG/3ML) 0.083% IN NEBU: 2.5000 mg | INHALATION_SOLUTION | Freq: Four times a day (QID) | RESPIRATORY_TRACT | 0 refills | Status: DC | PRN

## 2023-07-01 MED ORDER — DM-GUAIFENESIN ER 30-600 MG PO TB12: 1.0000 | ORAL_TABLET | Freq: Two times a day (BID) | ORAL | 0 refills | Status: DC

## 2023-07-01 NOTE — Discharge Summary (Signed)
Physician Discharge Summary   Patient: Miguel Marsh MRN: [id number] DOB: June 22, 1961  Admit date:     06/24/2023  Discharge date: 07/01/23  Discharge Physician: Tyrone Nine   PCP: [name], NP   Recommendations at discharge:  Follow up with oncology at [facility] for RLL cavitary nodule and hx NSCLC. Pt aware of the need for expedited outpatient evaluation.   Discharge Diagnoses: Principal Problem:   Acute on chronic respiratory failure with hypoxia (HCC) Active Problems:   Tobacco use disorder   Acute on chronic respiratory failure with hypoxia and hypercapnia (HCC)   CAP (community acquired pneumonia)   Lung nodule   COVID-19 virus infection  Hospital Course: 62 year old male with a history of COPD, chronic respiratory failure 3 L, HFpEF, stage IIIb, non-small cell lung cancer, tobacco abuse, hypertension presenting with 3 days of shortness of breath that has significantly worsened on 06/24/2023.  He denied any fevers, chills, headache, neck pain, nausea, vomiting or diarrhea or abdominal pain.  He had some substernal chest pain particularly with coughing.  He denies any hemoptysis.  He denies any diarrhea, hematochezia, melena, abdominal pain.  He has increased use of his nebulizer machine without much improvement.  As result he presented for further evaluation and treatment.  He continues to complain of his chronic right-sided upper abdominal and right lower chest pain. In the ED, the patient was afebrile hemodynamically stable with oxygen saturation of 95% on 6 L.  WBC 4.0, hemoglobin 1.9, platelets 1-40,000.  Sodium 139, potassium 4.6, bicarbonate 41, serum creatinine 0.60.  LFTs were unremarkable.  Chest x-ray showed bibasilar scarring and chronic interstitial markings.  Lactic acid 0.7>> 0.6.  COVID-19 PCR was positive.  EKG shows sinus rhythm without any ST-T wave change Regarding his lung cancer, he previously followed at [facility] and tx with pembrolizumab arm. Received 35  cycles 07/12/15 - 07/16/17.  Review of the medical record shows that his last office appointment was 07/19/2020.   The patient was recently hospitalized from 03/05/2023 to 03/09/2023 for acute on chronic respiratory failure secondary to COPD exacerbation.  He required BiPAP initially during that hospitalization but was weaned back to his usual 3 L at the time of discharge. In addition, the patient was also admitted from 02/01/23 to 02/08/23 due to acute on chronic respiratory failure with hypoxia and hypercapnia secondary to COPD exacerbation.  During that hospitalization, the patient also required BiPAP.   He developed respiratory distress in the morning of 06/25/23 and he was moved to SDU and placed on BiPAP.  He had difficulty tolerating BiPAP.  Fortunately, his sob improved, and the decision was to leave him off.  He remained stable off BiPAP although he has very low pulmonary reserve. Treated with remdesivir, steroids, and nebulized therapies. Respiratory status has stabilized. Transferred to floor 8/29. Pulmonary exam has normalized for >24 hours (monitored longer than would typically due to his limited pulmonary reserve) and he is ambulating without dyspnea on his home oxygen concentration.  Assessment and Plan: Acute on chronic respiratory failure with hypoxia and hypercarbia: No PE on CTA chest, +opacities consistent with covid pneumonia. PCT neg, MRSA PCR neg, BNP 35, LDH 118, troponin nl x2,  - Weaned to home 3L, dyspnea improved significantly. Continue nebs at home (states he has this and "some" solution, so will send in refill) - Tx covid-19, AECOPD as below   COVID-19 pneumonia:  - Completed remdesivir x3 days (8/26 - 8/28).  - Maintain airborne/contact isolation x10 days from positive test (  8/25) per protocol.  - Antitussives/mucolytic   COPD exacerbation - Continue home scheduled nebs and complete a prednisone taper   RLL cavitary nodule, history of NSCLC: 9mm RUL GGO, 1.9x1.8cm irregular  potentially cavitary RLL nodule on CTA 06/25/2023.  - Pt aware of concern for recurrence of lung CA, knows to follow up with oncology (established at [facility], initial Dx 2016, on pembrolizumab Sept 2016 - Sept 2018, last seen 2021).      Steroid-induced hyperglycemia: HbA1c 5.6%.  - No significant hyperglycemia currently (max was 179).    Chronic pain, anxiety: No new prescriptions for controlled substances provided at discharge - Reordered home oxycodone and clonazepam as confirmed by PDMP review.  -PDMP queried--receives monthly oxycodone 15 mg  #150 from K. 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  Consultants: None Procedures performed: None  Disposition: Home, no PT follow up recommended Diet recommendation: Regular DISCHARGE MEDICATION: Allergies as of 07/01/2023       Reactions   Ibuprofen Shortness Of Breath        Medication List     TAKE these medications    albuterol 108 (90 Base) MCG/ACT inhaler Commonly known as: VENTOLIN HFA Inhale 2 puffs into the lungs every 6 (six) hours as needed.   albuterol (2.5 MG/3ML) 0.083% nebulizer solution Commonly known as: PROVENTIL Inhale 3 mLs (2.5 mg total) into the lungs every 6 (six) hours as needed for wheezing or shortness of breath.   Breztri Aerosphere 160-9-4.8 MCG/ACT Aero Generic drug: Budeson-Glycopyrrol-Formoterol Inhale 2 puffs into the lungs 2 (two) times daily.   clonazePAM 1 MG tablet Commonly known as: KLONOPIN Take 1 mg by mouth 2 (two) times daily.   dextromethorphan-guaiFENesin 30-600 MG 12hr  tablet Commonly known as: MUCINEX DM Take 1 tablet by mouth 2 (two) times daily.   furosemide 40 MG tablet Commonly known as: LASIX Take 1 tablet (40 mg total) by mouth daily. What changed:  when to take this reasons to take this   oxyCODONE 15 MG immediate release tablet Commonly known as: ROXICODONE Take 15 mg by mouth every 4 (four) hours as needed.   OXYGEN Inhale 3 L into the lungs continuous.   predniSONE 20 MG tablet Commonly known as: DELTASONE Take 60mg  x 2 days, 40mg  x 2 days, 20mg  x 3 days        Follow-up Information     [name], NP Follow up.   Specialty: Internal Medicine Contact information: [address] [place] [address] [phone]                Discharge Exam: Ceasar Mons Weights   06/26/23 0559  Weight: 68.2 kg  BP 126/85 (BP Location: Right Arm)   Pulse 61   Temp 97.9 F (36.6 C) (Oral)   Resp 20   Ht 5\' 6"  (1.676 m)   Wt 68.2 kg   SpO2 100%   BMI 24.27 kg/m   Well-appearing 62yo M in no distress. Walked unassisted to bathroom then sink to wash hands and back to bed without tachypnea or dyspnea.  Lungs are clear bilaterally. RRR, no MRG or edema  Condition  at discharge: stable  The results of significant diagnostics from this hospitalization (including imaging, microbiology, ancillary and laboratory) are listed below for reference.   Imaging Studies: CT Angio Chest Pulmonary Embolism (PE) W or WO Contrast  Result Date: 06/25/2023 CLINICAL DATA:  62 year old male with positive D-dimer and COVID-19 infection. History of lung cancer. EXAM: CT ANGIOGRAPHY CHEST WITH CONTRAST TECHNIQUE: Multidetector CT imaging of the chest was performed using the standard protocol during bolus administration of intravenous contrast. Multiplanar CT image reconstructions and MIPs were obtained to evaluate the vascular anatomy. RADIATION DOSE REDUCTION: This exam was performed according to the departmental dose-optimization program which  includes automated exposure control, adjustment of the mA and/or kV according to patient size and/or use of iterative reconstruction technique. CONTRAST:  75mL OMNIPAQUE IOHEXOL 350 MG/ML SOLN COMPARISON:  10/06/2020 FINDINGS: Cardiovascular: The heart size is normal. No substantial pericardial effusion. Coronary artery calcification is evident. Mild atherosclerotic calcification is noted in the wall of the thoracic aorta. There is no filling defect within the opacified pulmonary arteries to suggest the presence of an acute pulmonary embolus. Mediastinum/Nodes: No mediastinal lymphadenopathy. There is no hilar lymphadenopathy. The esophagus has normal imaging features. There is no axillary lymphadenopathy. Lungs/Pleura: Centrilobular and paraseptal emphysema evident. 9 mm right upper lobe ground-glass opacity identified on image 44/8. 1.9 x 1.8 cm irregular potentially cavitary nodule identified right lower lobe on image 115/80. Bronchial wall thickening noted bilaterally with areas of cylindrical bronchiectasis in all lobes of both lungs. Small peripheral airway impaction noted lower lobes bilaterally, left greater than right. Peripheral patchy nodular airspace disease is seen in the dependent lung bases bilaterally with demonstrable branching of the nodule opacity in the right lower lobe. No pleural effusion. Upper Abdomen: Small cyst noted upper pole left kidney. No followup imaging is recommended. Thickening of both adrenal glands evident without a discrete nodule or mass. Musculoskeletal: No worrisome lytic or sclerotic osseous abnormality. Review of the MIP images confirms the above findings. IMPRESSION: 1. No CT evidence for acute pulmonary embolus. 2. 1.9 x 1.8 cm irregular potentially cavitary nodule in the right lower lobe. While this could be infectious/inflammatory, imaging features are concerning for malignancy. 3. 9 mm right upper lobe ground-glass opacity. Initial follow-up with CT at 6 months is  recommended to confirm persistence. If persistent, repeat CT is recommended every 2 years until 5 years of stability has been established. This recommendation follows the consensus statement: Guidelines for Management of Incidental Pulmonary Nodules Detected on CT Images: From the Fleischner Society 2017; Radiology 2017; 284:228-243. 4. Bronchial wall thickening with areas of cylindrical bronchiectasis in all lobes of both lungs. Peripheral patchy nodular airspace disease in the dependent lung bases bilaterally with demonstrable branching of the nodule opacity in the right lower lobe. Imaging features suggest atypical infection. 5. Aortic Atherosclerosis (ICD10-I70.0) and Emphysema (ICD10-J43.9). Electronically Signed   By: Kennith Center M.D.   On: 06/25/2023 18:49   DG Chest Port 1 View  Result Date: 06/25/2023 CLINICAL DATA:  Respiratory distress EXAM: PORTABLE CHEST 1 VIEW COMPARISON:  Chest radiograph 1 day prior FINDINGS: The cardiomediastinal silhouette is stable. Nodular opacity is again seen in the right lower lobe, unchanged. There is no new or worsening focal airspace consolidation. There is no pulmonary edema. There is no pleural effusion or pneumothorax. There is no acute osseous abnormality. IMPRESSION: 1. Unchanged nodular opacity in the right lower lobe. CT chest is recommended for further evaluation. 2. No new or worsening focal airspace disease. Electronically Signed  By: Lesia Hausen M.D.   On: 06/25/2023 14:56   DG Chest Port 1 View  Result Date: 06/24/2023 CLINICAL DATA:  Shortness of breath. EXAM: PORTABLE CHEST 1 VIEW COMPARISON:  May 06, 2023 FINDINGS: The heart size and mediastinal contours are within normal limits. The lungs are hyperinflated. Mild, diffuse, chronic appearing increased interstitial lung markings are noted. Mild scarring and/or atelectasis is seen within bilateral lung bases. A 5 mm nodular opacity is also seen overlying the right lung base. No pleural effusion or  pneumothorax is identified. Multilevel degenerative changes seen throughout the thoracic spine. IMPRESSION: 1. Mild bibasilar scarring and/or atelectasis. 2. Findings which may represent a small right basilar lung nodule. Correlation with nonemergent chest CT is recommended. Electronically Signed   By: Aram Candela M.D.   On: 06/24/2023 20:21    Microbiology: Results for orders placed or performed during the hospital encounter of 06/24/23  Blood Culture (routine x 2)     Status: None   Collection Time: 06/24/23  7:23 PM   Specimen: BLOOD LEFT HAND  Result Value Ref Range Status   Specimen Description BLOOD LEFT HAND BLOOD  Final   Special Requests   Final    BOTTLES DRAWN AEROBIC AND ANAEROBIC Blood Culture adequate volume   Culture   Final    NO GROWTH 5 DAYS Performed at [facility], [address] [place], [place] [address]    Report Status 06/29/2023 FINAL  Final  Resp panel by RT-PCR (RSV, Flu A&B, Covid) Anterior Nasal Swab     Status: Abnormal   Collection Time: 06/24/23  7:29 PM   Specimen: Anterior Nasal Swab  Result Value Ref Range Status   SARS Coronavirus 2 by RT PCR POSITIVE (A) NEGATIVE Final    Comment: (NOTE) SARS-CoV-2 target nucleic acids are DETECTED.  The SARS-CoV-2 RNA is generally detectable in upper respiratory specimens during the acute phase of infection. Positive results are indicative of the presence of the identified virus, but do not rule out bacterial infection or co-infection with other pathogens not detected by the test. Clinical correlation with patient history and other diagnostic information is necessary to determine patient infection status. The expected result is Negative.  Fact Sheet for Patients: BloggerCourse.com  Fact Sheet for Healthcare Providers: SeriousBroker.it  This test is not yet approved or cleared by the [place] FDA and  has been authorized for detection and/or  diagnosis of SARS-CoV-2 by FDA under an Emergency Use Authorization (EUA).  This EUA will remain in effect (meaning this test can be used) for the duration of  the COVID-19 declaration under Section 564(b)(1) of the A ct, 21 U.S.C. section 360bbb-3(b)(1), unless the authorization is terminated or revoked sooner.     Influenza A by PCR NEGATIVE NEGATIVE Final   Influenza B by PCR NEGATIVE NEGATIVE Final    Comment: (NOTE) The Xpert Xpress SARS-CoV-2/FLU/RSV plus assay is intended as an aid in the diagnosis of influenza from Nasopharyngeal swab specimens and should not be used as a sole basis for treatment. Nasal washings and aspirates are unacceptable for Xpert Xpress SARS-CoV-2/FLU/RSV testing.  Fact Sheet for Patients: BloggerCourse.com  Fact Sheet for Healthcare Providers: SeriousBroker.it  This test is not yet approved or cleared by the [place] FDA and has been authorized for detection and/or diagnosis of SARS-CoV-2 by FDA under an Emergency Use Authorization (EUA). This EUA will remain in effect (meaning this test can be used) for the duration of the COVID-19 declaration under Section 564(b)(1) of the  Act, 21 U.S.C. section 360bbb-3(b)(1), unless the authorization is terminated or revoked.     Resp Syncytial Virus by PCR NEGATIVE NEGATIVE Final    Comment: (NOTE) Fact Sheet for Patients: BloggerCourse.com  Fact Sheet for Healthcare Providers: SeriousBroker.it  This test is not yet approved or cleared by the [place] FDA and has been authorized for detection and/or diagnosis of SARS-CoV-2 by FDA under an Emergency Use Authorization (EUA). This EUA will remain in effect (meaning this test can be used) for the duration of the COVID-19 declaration under Section 564(b)(1) of the Act, 21 U.S.C. section 360bbb-3(b)(1), unless the authorization is terminated  or revoked.  Performed at [facility], [address] [place], [place] [address]   Blood Culture (routine x 2)     Status: None   Collection Time: 06/24/23  7:33 PM   Specimen: BLOOD LEFT HAND  Result Value Ref Range Status   Specimen Description BLOOD LEFT HAND BLOOD  Final   Special Requests   Final    BOTTLES DRAWN AEROBIC AND ANAEROBIC Blood Culture results may not be optimal due to an excessive volume of blood received in culture bottles   Culture   Final    NO GROWTH 5 DAYS Performed at [facility], [address], [place], [place] [address]    Report Status 06/29/2023 FINAL  Final  MRSA Next Gen by PCR, Nasal     Status: None   Collection Time: 06/25/23  1:34 PM   Specimen: Nasal Mucosa; Nasal Swab  Result Value Ref Range Status   MRSA by PCR Next Gen NOT DETECTED NOT DETECTED Final    Comment: (NOTE) The GeneXpert MRSA Assay (FDA approved for NASAL specimens only), is one component of a comprehensive MRSA colonization surveillance program. It is not intended to diagnose MRSA infection nor to guide or monitor treatment for MRSA infections. Test performance is not FDA approved in patients less than 58 years old. Performed at [facility], [address], [place], [place] [address]     Labs: CBC: Recent Labs  Lab 06/24/23 1924 06/25/23 0407 06/26/23 0511  WBC 4.0 3.1* 4.2  NEUTROABS 1.9  --   --   HGB 13.0 11.9* 11.8*  HCT 42.0 39.7 38.4*  MCV 95.2 97.1 95.5  PLT 136* 140* 151   Basic Metabolic Panel: Recent Labs  Lab 06/24/23 1924 06/25/23 0407 06/26/23 0511 07/01/23 0516  NA 138 139 139  --   K 3.7 4.6 4.1  --   CL 91* 91* 93*  --   CO2 43* 41* 41*  --   GLUCOSE 137* 179* 87  --   BUN 7* 7* 9  --   CREATININE 0.50* 0.60* 0.49* 0.57*  CALCIUM 8.4* 8.5* 8.6*  --   MG 2.0 2.0 1.9  --   PHOS  --  3.3  --   --    Liver Function Tests: Recent Labs  Lab 06/25/23 0407  AST 17  ALT 14  ALKPHOS 41  BILITOT 0.3  PROT 6.1*  ALBUMIN 3.0*    CBG: Recent Labs  Lab 06/25/23 1321  GLUCAP 129*    Discharge time spent: greater than 30 minutes.  Signed: Tyrone Nine, MD [facility] 07/01/2023

## 2023-07-01 NOTE — Plan of Care (Signed)
  Problem: Activity: Goal: Ability to tolerate increased activity will improve Outcome: Progressing   Problem: Clinical Measurements: Goal: Ability to maintain a body temperature in the normal range will improve Outcome: Progressing   Problem: Respiratory: Goal: Ability to maintain adequate ventilation will improve Outcome: Progressing Goal: Ability to maintain a clear airway will improve Outcome: Progressing   Problem: Education: Goal: Knowledge of General Education information will improve Description: Including pain rating scale, medication(s)/side effects and non-pharmacologic comfort measures Outcome: Progressing   Problem: Health Behavior/Discharge Planning: Goal: Ability to manage health-related needs will improve Outcome: Progressing   Problem: Clinical Measurements: Goal: Ability to maintain clinical measurements within normal limits will improve Outcome: Progressing Goal: Will remain free from infection Outcome: Progressing Goal: Diagnostic test results will improve Outcome: Progressing Goal: Respiratory complications will improve Outcome: Progressing Goal: Cardiovascular complication will be avoided Outcome: Progressing   Problem: Activity: Goal: Risk for activity intolerance will decrease Outcome: Progressing   Problem: Nutrition: Goal: Adequate nutrition will be maintained Outcome: Progressing   Problem: Coping: Goal: Level of anxiety will decrease Outcome: Progressing   Problem: Elimination: Goal: Will not experience complications related to bowel motility Outcome: Progressing Goal: Will not experience complications related to urinary retention Outcome: Progressing   Problem: Pain Managment: Goal: General experience of comfort will improve Outcome: Progressing   Problem: Safety: Goal: Ability to remain free from injury will improve Outcome: Progressing   Problem: Skin Integrity: Goal: Risk for impaired skin integrity will decrease Outcome:  Progressing   Problem: Education: Goal: Knowledge of risk factors and measures for prevention of condition will improve Outcome: Progressing   Problem: Coping: Goal: Psychosocial and spiritual needs will be supported Outcome: Progressing   Problem: Respiratory: Goal: Will maintain a patent airway Outcome: Progressing Goal: Complications related to the disease process, condition or treatment will be avoided or minimized Outcome: Progressing   

## 2023-09-13 ENCOUNTER — Other Ambulatory Visit: Payer: Self-pay

## 2023-09-13 ENCOUNTER — Emergency Department (HOSPITAL_COMMUNITY): Payer: 59

## 2023-09-13 ENCOUNTER — Encounter (HOSPITAL_COMMUNITY): Payer: Self-pay

## 2023-09-13 ENCOUNTER — Observation Stay (HOSPITAL_COMMUNITY)
Admission: EM | Admit: 2023-09-13 | Discharge: 2023-09-16 | Disposition: A | Discharge status: Home / Self Care | Payer: 59 | Attending: Family Medicine | Admitting: Family Medicine

## 2023-09-13 DIAGNOSIS — R911 Solitary pulmonary nodule: Secondary | ICD-10-CM | POA: Diagnosis present

## 2023-09-13 DIAGNOSIS — J9621 Acute and chronic respiratory failure with hypoxia: Secondary | ICD-10-CM | POA: Diagnosis present

## 2023-09-13 DIAGNOSIS — F1410 Cocaine abuse, uncomplicated: Secondary | ICD-10-CM | POA: Insufficient documentation

## 2023-09-13 DIAGNOSIS — I110 Hypertensive heart disease with heart failure: Secondary | ICD-10-CM | POA: Insufficient documentation

## 2023-09-13 DIAGNOSIS — J441 Chronic obstructive pulmonary disease with (acute) exacerbation: Principal | ICD-10-CM | POA: Diagnosis present

## 2023-09-13 DIAGNOSIS — F17200 Nicotine dependence, unspecified, uncomplicated: Secondary | ICD-10-CM

## 2023-09-13 DIAGNOSIS — G894 Chronic pain syndrome: Secondary | ICD-10-CM | POA: Insufficient documentation

## 2023-09-13 DIAGNOSIS — Z79899 Other long term (current) drug therapy: Secondary | ICD-10-CM | POA: Diagnosis not present

## 2023-09-13 DIAGNOSIS — Z96649 Presence of unspecified artificial hip joint: Secondary | ICD-10-CM | POA: Diagnosis not present

## 2023-09-13 DIAGNOSIS — I5032 Chronic diastolic (congestive) heart failure: Secondary | ICD-10-CM | POA: Insufficient documentation

## 2023-09-13 DIAGNOSIS — K219 Gastro-esophageal reflux disease without esophagitis: Secondary | ICD-10-CM | POA: Diagnosis not present

## 2023-09-13 DIAGNOSIS — Z87891 Personal history of nicotine dependence: Secondary | ICD-10-CM | POA: Insufficient documentation

## 2023-09-13 DIAGNOSIS — Z85118 Personal history of other malignant neoplasm of bronchus and lung: Secondary | ICD-10-CM | POA: Insufficient documentation

## 2023-09-13 DIAGNOSIS — G893 Neoplasm related pain (acute) (chronic): Secondary | ICD-10-CM

## 2023-09-13 DIAGNOSIS — R0602 Shortness of breath: Secondary | ICD-10-CM | POA: Diagnosis present

## 2023-09-13 DIAGNOSIS — J449 Chronic obstructive pulmonary disease, unspecified: Secondary | ICD-10-CM | POA: Diagnosis present

## 2023-09-13 LAB — CBC WITH DIFFERENTIAL/PLATELET
Abs Immature Granulocytes: 0.02 10*3/uL (ref 0.00–0.07)
Basophils Absolute: 0 10*3/uL (ref 0.0–0.1)
Basophils Relative: 1 %
Eosinophils Absolute: 0.1 10*3/uL (ref 0.0–0.5)
Eosinophils Relative: 1 %
HCT: 40.7 % (ref 39.0–52.0)
Hemoglobin: 12.5 g/dL — ABNORMAL LOW (ref 13.0–17.0)
Immature Granulocytes: 0 %
Lymphocytes Relative: 18 %
Lymphs Abs: 1.2 10*3/uL (ref 0.7–4.0)
MCH: 29.3 pg (ref 26.0–34.0)
MCHC: 30.7 g/dL (ref 30.0–36.0)
MCV: 95.3 fL (ref 80.0–100.0)
Monocytes Absolute: 0.6 10*3/uL (ref 0.1–1.0)
Monocytes Relative: 9 %
Neutro Abs: 4.7 10*3/uL (ref 1.7–7.7)
Neutrophils Relative %: 71 %
Platelets: 170 10*3/uL (ref 150–400)
RBC: 4.27 MIL/uL (ref 4.22–5.81)
RDW: 11.9 % (ref 11.5–15.5)
WBC: 6.6 10*3/uL (ref 4.0–10.5)
nRBC: 0 % (ref 0.0–0.2)

## 2023-09-13 LAB — COMPREHENSIVE METABOLIC PANEL
ALT: 10 U/L (ref 0–44)
AST: 13 U/L — ABNORMAL LOW (ref 15–41)
Albumin: 3.6 g/dL (ref 3.5–5.0)
Alkaline Phosphatase: 53 U/L (ref 38–126)
Anion gap: 10 (ref 5–15)
BUN: 9 mg/dL (ref 8–23)
CO2: 37 mmol/L — ABNORMAL HIGH (ref 22–32)
Calcium: 8.9 mg/dL (ref 8.9–10.3)
Chloride: 92 mmol/L — ABNORMAL LOW (ref 98–111)
Creatinine, Ser: 0.49 mg/dL — ABNORMAL LOW (ref 0.61–1.24)
GFR, Estimated: >60 mL/min (ref >60)
Glucose, Bld: 89 mg/dL (ref 70–99)
Potassium: 3.7 mmol/L (ref 3.5–5.1)
Sodium: 139 mmol/L (ref 135–145)
Total Bilirubin: 0.6 mg/dL (ref <1.2)
Total Protein: 6.5 g/dL (ref 6.5–8.1)

## 2023-09-13 LAB — BRAIN NATRIURETIC PEPTIDE: B Natriuretic Peptide: 27 pg/mL (ref 0.0–100.0)

## 2023-09-13 MED ORDER — IPRATROPIUM-ALBUTEROL 0.5-2.5 (3) MG/3ML IN SOLN
3.0000 mL | Freq: Once | RESPIRATORY_TRACT | Status: AC
  Administered 2023-09-13: 3 mL via RESPIRATORY_TRACT
  Filled 2023-09-13: qty 3

## 2023-09-13 MED ORDER — ENOXAPARIN SODIUM 40 MG/0.4ML IJ SOSY
40.0000 mg | PREFILLED_SYRINGE | INTRAMUSCULAR | Status: DC
  Administered 2023-09-13 – 2023-09-15 (×3): 40 mg via SUBCUTANEOUS
  Filled 2023-09-13 (×3): qty 0.4

## 2023-09-13 MED ORDER — MAGNESIUM SULFATE 2 GM/50ML IV SOLN
2.0000 g | Freq: Once | INTRAVENOUS | Status: AC
  Administered 2023-09-13: 2 g via INTRAVENOUS
  Filled 2023-09-13: qty 50

## 2023-09-13 MED ORDER — ALBUTEROL SULFATE (2.5 MG/3ML) 0.083% IN NEBU
2.5000 mg | INHALATION_SOLUTION | Freq: Once | RESPIRATORY_TRACT | Status: AC
  Administered 2023-09-13: 2.5 mg via RESPIRATORY_TRACT
  Filled 2023-09-13: qty 3

## 2023-09-13 MED ORDER — INFLUENZA VIRUS VACC SPLIT PF (FLUZONE) 0.5 ML IM SUSY: 0.5000 mL | PREFILLED_SYRINGE | INTRAMUSCULAR | Status: DC

## 2023-09-13 MED ORDER — IPRATROPIUM-ALBUTEROL 0.5-2.5 (3) MG/3ML IN SOLN
3.0000 mL | Freq: Four times a day (QID) | RESPIRATORY_TRACT | Status: DC
  Administered 2023-09-13 – 2023-09-15 (×8): 3 mL via RESPIRATORY_TRACT
  Filled 2023-09-13 (×8): qty 3

## 2023-09-13 MED ORDER — DM-GUAIFENESIN ER 30-600 MG PO TB12
1.0000 | ORAL_TABLET | Freq: Two times a day (BID) | ORAL | Status: DC
Start: 2023-09-13 — End: 2023-09-16
  Administered 2023-09-13 – 2023-09-16 (×6): 1 via ORAL
  Filled 2023-09-13 (×6): qty 1

## 2023-09-13 MED ORDER — PANTOPRAZOLE SODIUM 40 MG PO TBEC
40.0000 mg | DELAYED_RELEASE_TABLET | Freq: Every day | ORAL | Status: DC
  Administered 2023-09-13 – 2023-09-16 (×4): 40 mg via ORAL
  Filled 2023-09-13 (×4): qty 1

## 2023-09-13 MED ORDER — METHYLPREDNISOLONE SODIUM SUCC 40 MG IJ SOLR
40.0000 mg | Freq: Two times a day (BID) | INTRAMUSCULAR | Status: DC
  Administered 2023-09-13 – 2023-09-16 (×6): 40 mg via INTRAVENOUS
  Filled 2023-09-13 (×6): qty 1

## 2023-09-13 MED ORDER — ONDANSETRON HCL 4 MG PO TABS: 4.0000 mg | ORAL_TABLET | Freq: Four times a day (QID) | ORAL | Status: DC | PRN

## 2023-09-13 MED ORDER — IOHEXOL 350 MG/ML SOLN
75.0000 mL | Freq: Once | INTRAVENOUS | Status: AC | PRN
  Administered 2023-09-13: 75 mL via INTRAVENOUS

## 2023-09-13 MED ORDER — ONDANSETRON HCL 4 MG/2ML IJ SOLN: 4.0000 mg | Freq: Four times a day (QID) | INTRAMUSCULAR | Status: DC | PRN

## 2023-09-13 MED ORDER — OXYCODONE HCL 5 MG PO TABS
15.0000 mg | ORAL_TABLET | ORAL | Status: DC | PRN
  Administered 2023-09-13 – 2023-09-15 (×4): 15 mg via ORAL
  Filled 2023-09-13 (×4): qty 3

## 2023-09-13 MED ORDER — AZITHROMYCIN 250 MG PO TABS
250.0000 mg | ORAL_TABLET | Freq: Every day | ORAL | Status: DC
  Administered 2023-09-14 – 2023-09-16 (×3): 250 mg via ORAL
  Filled 2023-09-13 (×3): qty 1

## 2023-09-13 MED ORDER — ACETAMINOPHEN 650 MG RE SUPP: 650.0000 mg | Freq: Four times a day (QID) | RECTAL | Status: DC | PRN

## 2023-09-13 MED ORDER — AZITHROMYCIN 250 MG PO TABS
500.0000 mg | ORAL_TABLET | Freq: Every day | ORAL | Status: AC
  Administered 2023-09-13: 500 mg via ORAL
  Filled 2023-09-13: qty 2

## 2023-09-13 MED ORDER — METHYLPREDNISOLONE SODIUM SUCC 125 MG IJ SOLR
125.0000 mg | Freq: Once | INTRAMUSCULAR | Status: AC
Start: 2023-09-13 — End: 2023-09-13
  Administered 2023-09-13: 125 mg via INTRAVENOUS
  Filled 2023-09-13: qty 2

## 2023-09-13 MED ORDER — ACETAMINOPHEN 325 MG PO TABS: 650.0000 mg | ORAL_TABLET | Freq: Four times a day (QID) | ORAL | Status: DC | PRN

## 2023-09-13 MED ORDER — IPRATROPIUM-ALBUTEROL 0.5-2.5 (3) MG/3ML IN SOLN: 3.0000 mL | RESPIRATORY_TRACT | Status: DC | PRN

## 2023-09-13 MED ORDER — PNEUMOCOCCAL 20-VAL CONJ VACC 0.5 ML IM SUSY: 0.5000 mL | PREFILLED_SYRINGE | INTRAMUSCULAR | Status: DC

## 2023-09-13 NOTE — ED Provider Notes (Signed)
Patient with a history of COPD.  Is normally on oxygen.  Patient has had some improvement in his breathing but he still wheezing.  He feels is not well enough to go home.  He had multiple nebulizers he was giving steroids.  Patient has a recurrence of small cell cancer.  Was to follow-up today at the cancer center in [place].  He had previous history of small cell which was in remission until recently.  Currently not receiving any therapies.  CBC white count 6.6 hemoglobin 12.5 complete metabolic panel liver function test normal.  Renal function GFR greater than 60 electrolytes normal CO2 up some at 37 suggesting he is a retainer.  BMP was normal at 27.  CT angio chest no evidence of acute pulmonary embolism there is a 1.9 x 1.9 cm irregular nodule in the right lower lobe is similar in size and demonstration and now solid appearance without focal central lucency these are concerning for malignancy.  Essentially unchanged 8 mm groundglass nodule opacity in the right upper lobe.  Dr. Estell Harpin had contacted the hospitalist and made them aware that most likely this patient was going to require admission once we knew the CT angio.  CT angio does not show evidence of pulmonary embolism.  Patient will need admission for COPD exacerbation.   Vanetta Mulders, MD 09/13/23 1944

## 2023-09-13 NOTE — ED Notes (Signed)
ED TO INPATIENT HANDOFF REPORT  ED Nurse Name and Phone #:   S Name/Age/Gender Miguel Marsh 62 y.o. male Room/Bed: APA10/APA10  Code Status   Code Status: Prior  Home/SNF/Other Home Patient oriented to: self, place, time, and situation Is this baseline? Yes   Triage Complete: Triage complete  Chief Complaint Acute exacerbation of chronic obstructive pulmonary disease (COPD) (HCC) [J44.1]  Triage Note Pt bib [facility] for SOB. Pt states he has a hx of lung cancer, COPD, CHF. Pt states he woke up this morning more SOB than normal. Pt normally wears 3 liter of O2 at home, pt is currently on 4 liters. EMS placed an 18 g in R AC. Pt states he used cocaine yesterday.    Allergies Allergies  Allergen Reactions   Ibuprofen Shortness Of Breath    Level of Care/Admitting Diagnosis ED Disposition     ED Disposition  Admit   Condition  --   Comment  Hospital Area: [facility] [[id number]]  Level of Care: Med-Surg [16]  Covid Evaluation: Asymptomatic - no recent exposure (last 10 days) testing not required  Diagnosis: Acute exacerbation of chronic obstructive pulmonary disease (COPD) (HCC) [841324]  Admitting Physician: Frankey Shown [[id number]]  Attending Physician: Frankey Shown [[id number]]          B Medical/Surgery History Past Medical History:  Diagnosis Date   Arthritis    Bronchitis    Cancer (HCC)    metastatic NSCLC (followed by [facility])   COPD (chronic obstructive pulmonary disease) (HCC)    HTN (hypertension)    Past Surgical History:  Procedure Laterality Date   LUNG BIOPSY     TOTAL HIP ARTHROPLASTY     TUMOR REMOVAL Right 12/04/2017     A IV Location/Drains/Wounds Patient Lines/Drains/Airways Status     Active Line/Drains/Airways     Name Placement date Placement time Site Days   Peripheral IV 09/13/23 18 G 1" Right Antecubital 09/13/23  1239  Antecubital  less than 1            Intake/Output Last 24 hours No intake or output data  in the 24 hours ending 09/13/23 2033  Labs/Imaging Results for orders placed or performed during the hospital encounter of 09/13/23 (from the past 48 hour(s))  CBC with Differential     Status: Abnormal   Collection Time: 09/13/23  9:14 AM  Result Value Ref Range   WBC 6.6 4.0 - 10.5 K/uL   RBC 4.27 4.22 - 5.81 MIL/uL   Hemoglobin 12.5 (L) 13.0 - 17.0 g/dL   HCT 34.7 42.5 - 95.6 %   MCV 95.3 80.0 - 100.0 fL   MCH 29.3 26.0 - 34.0 pg   MCHC 30.7 30.0 - 36.0 g/dL   RDW 38.7 56.4 - 33.2 %   Platelets 170 150 - 400 K/uL   nRBC 0.0 0.0 - 0.2 %   Neutrophils Relative % 71 %   Neutro Abs 4.7 1.7 - 7.7 K/uL   Lymphocytes Relative 18 %   Lymphs Abs 1.2 0.7 - 4.0 K/uL   Monocytes Relative 9 %   Monocytes Absolute 0.6 0.1 - 1.0 K/uL   Eosinophils Relative 1 %   Eosinophils Absolute 0.1 0.0 - 0.5 K/uL   Basophils Relative 1 %   Basophils Absolute 0.0 0.0 - 0.1 K/uL   Immature Granulocytes 0 %   Abs Immature Granulocytes 0.02 0.00 - 0.07 K/uL    Comment: Performed at [facility], [address] [place], [place] [address]  Comprehensive metabolic panel     Status: Abnormal   Collection Time: 09/13/23  9:14 AM  Result Value Ref Range   Sodium 139 135 - 145 mmol/L   Potassium 3.7 3.5 - 5.1 mmol/L   Chloride 92 (L) 98 - 111 mmol/L   CO2 37 (H) 22 - 32 mmol/L   Glucose, Bld 89 70 - 99 mg/dL    Comment: Glucose reference range applies only to samples taken after fasting for at least 8 hours.   BUN 9 8 - 23 mg/dL   Creatinine, Ser 8.11 (L) 0.61 - 1.24 mg/dL   Calcium 8.9 8.9 - 91.4 mg/dL   Total Protein 6.5 6.5 - 8.1 g/dL   Albumin 3.6 3.5 - 5.0 g/dL   AST 13 (L) 15 - 41 U/L   ALT 10 0 - 44 U/L   Alkaline Phosphatase 53 38 - 126 U/L   Total Bilirubin 0.6 <1.2 mg/dL   GFR, Estimated >78 >29 mL/min    Comment: (NOTE) Calculated using the CKD-EPI Creatinine Equation (2021)    Anion gap 10 5 - 15    Comment: Performed at [facility], [address], [place], [place] [address]  Brain  natriuretic peptide     Status: None   Collection Time: 09/13/23  9:14 AM  Result Value Ref Range   B Natriuretic Peptide 27.0 0.0 - 100.0 pg/mL    Comment: Performed at [facility], [address], [place], [place] [address]   CT Angio Chest PE W and/or Wo Contrast  Result Date: 09/13/2023 CLINICAL DATA:  Pulmonary embolism (PE) suspected, high prob. History of lung cancer. EXAM: CT ANGIOGRAPHY CHEST WITH CONTRAST TECHNIQUE: Multidetector CT imaging of the chest was performed using the standard protocol during bolus administration of intravenous contrast. Multiplanar CT image reconstructions and MIPs were obtained to evaluate the vascular anatomy. RADIATION DOSE REDUCTION: This exam was performed according to the departmental dose-optimization program which includes automated exposure control, adjustment of the mA and/or kV according to patient size and/or use of iterative reconstruction technique. CONTRAST:  75mL OMNIPAQUE IOHEXOL 350 MG/ML SOLN COMPARISON:  CTA chest dated June 25, 2023. FINDINGS: Cardiovascular: Satisfactory opacification of the pulmonary arteries to the segmental level. No evidence of pulmonary embolism. Normal heart size. No pericardial effusion. Multivessel coronary artery calcifications. Mild atherosclerosis of the thoracic aorta. Mediastinum/Nodes: No enlarged mediastinal, hilar, or axillary lymph nodes. Thyroid gland, trachea, and esophagus demonstrate no significant findings. Lungs/Pleura: Upper lobe predominant centrilobular and paraseptal emphysema. The previously described irregular potentially cavitary nodule in the right lower lobe is now solid in appearance without focal central lucency and measures 1.9 x 1.9 cm (series 14, image 115), previously 1.9 x 1.8 cm. This demonstrates similar adjacent branching in the right lower lobe. Essentially unchanged 8 mm ground-glass nodular opacity in the right upper lobe (series 14, image 43). Similar bronchial wall thickening  bilaterally with cylindrical bronchiectasis. No pleural effusion. No pneumothorax. Upper Abdomen: No acute abnormality. Stable left upper pole renal cyst. Musculoskeletal: No chest wall abnormality. No acute or significant osseous findings. Review of the MIP images confirms the above findings. IMPRESSION: 1. No evidence of acute pulmonary embolism. 2. 1.9 x 1.9 cm irregular nodule in the right lower lobe is similar in size and demonstrates a now solid appearance without focal central lucency. These findings are concerning for malignancy. 3. Essentially unchanged 8 mm ground-glass nodular opacity in the right upper lobe. Recommend continued attention on follow-up imaging. 4. Similar bronchial wall thickening with cylindrical bronchiectasis.  5. Aortic Atherosclerosis (ICD10-I70.0) and Emphysema (ICD10-J43.9). Electronically Signed   By: Hart Robinsons M.D.   On: 09/13/2023 18:12   DG Chest 2 View  Result Date: 09/13/2023 CLINICAL DATA:  Shortness of breath.  History of lung cancer EXAM: CHEST - 2 VIEW COMPARISON:  June 25, 2023. FINDINGS: The heart size and mediastinal contours are within normal limits. Right basilar nodular density is again noted as described on prior CT scan. Left lung is unremarkable. The visualized skeletal structures are unremarkable. IMPRESSION: Right basilar nodular density is again noted as described as potential malignancy on prior CT scan. Electronically Signed   By: Lupita Raider M.D.   On: 09/13/2023 10:25    Pending Labs Unresulted Labs (From admission, onward)    None       Vitals/Pain Today's Vitals   09/13/23 1359 09/13/23 1415 09/13/23 1717 09/13/23 1900  BP:  (!) 146/84 (!) 146/84   Pulse:  84 84 78  Resp:   (!) 23   Temp:   98.9 F (37.2 C)   TempSrc:      SpO2: 98% 96% 96% 98%  Weight:      Height:      PainSc:        Isolation Precautions No active isolations  Medications Medications  ipratropium-albuterol (DUONEB) 0.5-2.5 (3) MG/3ML  nebulizer solution 3 mL (has no administration in time range)  ipratropium-albuterol (DUONEB) 0.5-2.5 (3) MG/3ML nebulizer solution 3 mL (has no administration in time range)  dextromethorphan-guaiFENesin (MUCINEX DM) 30-600 MG per 12 hr tablet 1 tablet (has no administration in time range)  methylPREDNISolone sodium succinate (SOLU-MEDROL) 40 mg/mL injection 40 mg (has no administration in time range)  azithromycin (ZITHROMAX) tablet 500 mg (has no administration in time range)    Followed by  azithromycin (ZITHROMAX) tablet 250 mg (has no administration in time range)  pantoprazole (PROTONIX) EC tablet 40 mg (has no administration in time range)  ipratropium-albuterol (DUONEB) 0.5-2.5 (3) MG/3ML nebulizer solution 3 mL (3 mLs Nebulization Given 09/13/23 0923)  albuterol (PROVENTIL) (2.5 MG/3ML) 0.083% nebulizer solution 2.5 mg (2.5 mg Nebulization Given 09/13/23 0923)  magnesium sulfate IVPB 2 g 50 mL (0 g Intravenous Stopped 09/13/23 1427)  ipratropium-albuterol (DUONEB) 0.5-2.5 (3) MG/3ML nebulizer solution 3 mL (3 mLs Nebulization Given 09/13/23 1359)  albuterol (PROVENTIL) (2.5 MG/3ML) 0.083% nebulizer solution 2.5 mg (2.5 mg Nebulization Given 09/13/23 1359)  methylPREDNISolone sodium succinate (SOLU-MEDROL) 125 mg/2 mL injection 125 mg (125 mg Intravenous Given 09/13/23 1324)  iohexol (OMNIPAQUE) 350 MG/ML injection 75 mL (75 mLs Intravenous Contrast Given 09/13/23 1429)    Mobility walks     Focused Assessments    R Recommendations: See Admitting Provider Note  Report given to:   Additional Notes:

## 2023-09-13 NOTE — H&P (Addendum)
History and Physical    Patient: Miguel Marsh:[id number] DOB: [birth year] DOA: 09/13/2023 DOS: the patient was seen and examined on 09/13/2023 PCP: [name], NP  Patient coming from: Home  Chief Complaint:  Chief Complaint  Patient presents with   Shortness of Breath   HPI: Miguel Marsh is a 62 y.o. male with medical history significant of GERD, COPD hypertension,  chronic hypoxic respiratory failure on 3 LPM of oxygen at baseline, HFpEF, history of metastatic NSCLC (follows with [facility]), tobacco abuse who presents to the emergency department from home via EMS due to worsening shortness of breath.  Patient complained of several days of mild cough with occasional small amount of green sputum production and increasing shortness of breath.  He has a history of lung cancer which was in remission, but recently recurred and was going to follow-up at the cancer center in with insulin today, while dressing to go for his appointment, he noted worsening shortness of breath with increased work of breathing that resulted in activating EMS.  Patient has increased home oxygen use to 4 LPM from 3 LPM due to the worsening shortness of breath.  Patient was sent to the ED for further evaluation and management. Patient was admitted from 8/25 to 9/1 due to acute on chronic respiratory failure with hypoxia and hypercarbia secondary to COVID-pneumonia and COPD exacerbation  ED Course:  In the emergency department, patient was hemodynamically stable, BP was 149/76, O2 sat was 93% to 100% on supplemental oxygen via [place] at 4 LPM, other vital signs were within normal range.  Showed normocytic anemia.  BMP was normal except for chloride of 93, bicarb 37, creatinine 0.49, BNP 27.0.  X-ray showed right  basilar nodular density is again noted as described as potential malignancy on prior CT scan. CT angiography of chest with contrast showed 1.9 x 1.9 cm irregular nodule in the  right lower lobe is similar in size and demonstrates a now solid appearance without focal central lucency. These findings are concerning for malignancy. Essentially unchanged 8 mm ground-glass nodular opacity in the right upper lobe. It was provided, Solu-Medrol to 9/1 mg x 1 was given, IV magnesium 2 g x 1 was given.  Hospitalist was asked to admit patient for further evaluation and management.  Review of Systems: Review of systems as noted in the HPI. All other systems reviewed and are negative.   Past Medical History:  Diagnosis Date   Arthritis    Bronchitis    Cancer (HCC)    metastatic NSCLC (followed by [facility])   COPD (chronic obstructive pulmonary disease) (HCC)    HTN (hypertension)    Past Surgical History:  Procedure Laterality Date   LUNG BIOPSY     TOTAL HIP ARTHROPLASTY     TUMOR REMOVAL Right 12/04/2017    Social History:  reports that he has quit smoking. His smoking use included cigarettes. He has never used smokeless tobacco. He reports current alcohol use. He reports current drug use. Drugs: Marijuana and Cocaine.   Allergies  Allergen Reactions   Ibuprofen Shortness Of Breath    History reviewed. No pertinent family history.   Prior to Admission medications   Medication Sig Start Date End Date Taking? Authorizing Provider  albuterol (PROVENTIL) (2.5 MG/3ML) 0.083% nebulizer solution Inhale 3 mLs (2.5 mg total) into the lungs every 6 (six) hours as needed for wheezing or shortness of breath. 07/01/23  Yes Tyrone Nine, MD  albuterol (VENTOLIN HFA)  108 (90 Base) MCG/ACT inhaler Inhale 2 puffs into the lungs every 6 (six) hours as needed. 06/18/23  Yes [provider]  BREZTRI AEROSPHERE 160-9-4.8 MCG/ACT AERO Inhale 2 puffs into the lungs 2 (two) times daily. 05/17/22  Yes [provider]  clonazePAM (KLONOPIN) 1 MG tablet Take 1 mg by mouth 2 (two) times daily. 05/18/22  Yes [provider]  furosemide (LASIX) 40 MG tablet Take 1 tablet  (40 mg total) by mouth daily. Patient taking differently: Take 40 mg by mouth as needed for fluid or edema. 03/09/23  Yes Vassie Loll, MD  hydrochlorothiazide (HYDRODIURIL) 25 MG tablet Take 25 mg by mouth daily. 08/14/23  Yes [provider]  omeprazole (PRILOSEC) 40 MG capsule Take by mouth daily. 08/06/23  Yes [provider]  oxyCODONE (ROXICODONE) 15 MG immediate release tablet Take 15 mg by mouth every 4 (four) hours as needed.   Yes [provider]  OXYGEN Inhale 3 L into the lungs continuous.    Yes [provider]    Physical Exam: BP (!) 146/84   Pulse 78   Temp 98.9 F (37.2 C)   Resp (!) 23   Ht 5\' 6"  (1.676 m)   Wt 74.8 kg   SpO2 98%   BMI 26.63 kg/m   General: 62 y.o. year-old male well developed well nourished in no acute distress.  Alert and oriented x3. HEENT: NCAT, EOMI Neck: Supple, trachea medial Cardiovascular: Regular rate and rhythm with no rubs or gallops.  No thyromegaly or JVD noted.  No lower extremity edema. 2/4 pulses in all 4 extremities. Respiratory: Decreased breath sounds in all lobes on auscultation with no rales.  Abdomen: Soft, nontender nondistended with normal bowel sounds x4 quadrants. Muskuloskeletal: No cyanosis, clubbing or edema noted bilaterally Neuro: CN II-XII intact, strength 5/5 x 4, sensation, reflexes intact Skin: No ulcerative lesions noted or rashes Psychiatry: Judgement and insight appear normal. Mood is appropriate for condition and setting          Labs on Admission:  Basic Metabolic Panel: Recent Labs  Lab 09/13/23 0914  NA 139  K 3.7  CL 92*  CO2 37*  GLUCOSE 89  BUN 9  CREATININE 0.49*  CALCIUM 8.9   Liver Function Tests: Recent Labs  Lab 09/13/23 0914  AST 13*  ALT 10  ALKPHOS 53  BILITOT 0.6  PROT 6.5  ALBUMIN 3.6   No results for input(s): "LIPASE", "AMYLASE" in the last 168 hours. No results for input(s): "AMMONIA" in the last 168 hours. CBC: Recent Labs   Lab 09/13/23 0914  WBC 6.6  NEUTROABS 4.7  HGB 12.5*  HCT 40.7  MCV 95.3  PLT 170   Cardiac Enzymes: No results for input(s): "CKTOTAL", "CKMB", "CKMBINDEX", "TROPONINI" in the last 168 hours.  BNP (last 3 results) Recent Labs    03/05/23 2326 06/25/23 1442 09/13/23 0914  BNP 183.0* 35.0 27.0    ProBNP (last 3 results) No results for input(s): "PROBNP" in the last 8760 hours.  CBG: No results for input(s): "GLUCAP" in the last 168 hours.  Radiological Exams on Admission: CT Angio Chest PE W and/or Wo Contrast  Result Date: 09/13/2023 CLINICAL DATA:  Pulmonary embolism (PE) suspected, high prob. History of lung cancer. EXAM: CT ANGIOGRAPHY CHEST WITH CONTRAST TECHNIQUE: Multidetector CT imaging of the chest was performed using the standard protocol during bolus administration of intravenous contrast. Multiplanar CT image reconstructions and MIPs were obtained to evaluate the vascular anatomy. RADIATION DOSE REDUCTION:  This exam was performed according to the departmental dose-optimization program which includes automated exposure control, adjustment of the mA and/or kV according to patient size and/or use of iterative reconstruction technique. CONTRAST:  75mL OMNIPAQUE IOHEXOL 350 MG/ML SOLN COMPARISON:  CTA chest dated June 25, 2023. FINDINGS: Cardiovascular: Satisfactory opacification of the pulmonary arteries to the segmental level. No evidence of pulmonary embolism. Normal heart size. No pericardial effusion. Multivessel coronary artery calcifications. Mild atherosclerosis of the thoracic aorta. Mediastinum/Nodes: No enlarged mediastinal, hilar, or axillary lymph nodes. Thyroid gland, trachea, and esophagus demonstrate no significant findings. Lungs/Pleura: Upper lobe predominant centrilobular and paraseptal emphysema. The previously described irregular potentially cavitary nodule in the right lower lobe is now solid in appearance without focal central lucency and measures 1.9 x  1.9 cm (series 14, image 115), previously 1.9 x 1.8 cm. This demonstrates similar adjacent branching in the right lower lobe. Essentially unchanged 8 mm ground-glass nodular opacity in the right upper lobe (series 14, image 43). Similar bronchial wall thickening bilaterally with cylindrical bronchiectasis. No pleural effusion. No pneumothorax. Upper Abdomen: No acute abnormality. Stable left upper pole renal cyst. Musculoskeletal: No chest wall abnormality. No acute or significant osseous findings. Review of the MIP images confirms the above findings. IMPRESSION: 1. No evidence of acute pulmonary embolism. 2. 1.9 x 1.9 cm irregular nodule in the right lower lobe is similar in size and demonstrates a now solid appearance without focal central lucency. These findings are concerning for malignancy. 3. Essentially unchanged 8 mm ground-glass nodular opacity in the right upper lobe. Recommend continued attention on follow-up imaging. 4. Similar bronchial wall thickening with cylindrical bronchiectasis. 5. Aortic Atherosclerosis (ICD10-I70.0) and Emphysema (ICD10-J43.9). Electronically Signed   By: Hart Robinsons M.D.   On: 09/13/2023 18:12   DG Chest 2 View  Result Date: 09/13/2023 CLINICAL DATA:  Shortness of breath.  History of lung cancer EXAM: CHEST - 2 VIEW COMPARISON:  June 25, 2023. FINDINGS: The heart size and mediastinal contours are within normal limits. Right basilar nodular density is again noted as described on prior CT scan. Left lung is unremarkable. The visualized skeletal structures are unremarkable. IMPRESSION: Right basilar nodular density is again noted as described as potential malignancy on prior CT scan. Electronically Signed   By: Lupita Raider M.D.   On: 09/13/2023 10:25    EKG: I independently viewed the EKG done and my findings are as followed: Normal sinus rhythm at rate of 89 bpm with LAFB  Assessment/Plan Present on Admission:  Acute exacerbation of chronic obstructive  pulmonary disease (COPD) (HCC)  Acute on chronic respiratory failure with hypoxia (HCC)  Lung nodule  GERD (gastroesophageal reflux disease)  Principal Problem:   Acute exacerbation of chronic obstructive pulmonary disease (COPD) (HCC) Active Problems:   GERD (gastroesophageal reflux disease)   Acute on chronic respiratory failure with hypoxia (HCC)   Lung nodule   Cocaine abuse (HCC)  Acute exacerbation of COPD Continue duo nebs, Mucinex, Solu-Medrol, azithromycin. Continue Protonix to prevent steroid-induced ulcer Continue incentive spirometry and flutter valve  Acute on chronic respiratory failure with hypoxia Continue supplemental oxygen to maintain O2 sat > 92% with plan to wean patient off oxygen as tolerated  Lung nodule 1.9 x 1.9 cm irregular nodule in the right lower lobe is similar in size and demonstration and now solid appearance without focal central lucency these are concerning for malignancy.  Patient follows with [facility] in [place]  GERD Continue Protonix  Chronic Pain Continue home meds  Cocaine abuse Patient endorsed use of cocaine yesterday Patient was counseled on cocaine use cessation  Tobacco use disorder Patient was counseled on tobacco use cessation  DVT prophylaxis: Lovenox  Code Status: Full code  Family Communication: None at bedside  Consults: None  Severity of Illness: The appropriate patient status for this patient is OBSERVATION. Observation status is judged to be reasonable and necessary in order to provide the required intensity of service to ensure the patient's safety. The patient's presenting symptoms, physical exam findings, and initial radiographic and laboratory data in the context of their medical condition is felt to place them at decreased risk for further clinical deterioration. Furthermore, it is anticipated that the patient will be medically stable for discharge from the hospital within 2 midnights  of admission.   Author: Frankey Shown, DO 09/13/2023 9:11 PM  For on call review [email or web address].

## 2023-09-13 NOTE — ED Provider Notes (Signed)
[place] [facility] DEPARTMENT AT [facility] Provider Note   CSN: [id number] Arrival date & time: 09/13/23  0818     History  Chief Complaint  Patient presents with   Shortness of Breath    Miguel Marsh is a 62 y.o. male.  Patient has a history of COPD and lung cancer.  He states he developed a cough this week and has been short of breath.  Small amount of green sputum production.  Patient states he has no energy to get up and walk around  The history is provided by the patient and medical records. No language interpreter was used.  Shortness of Breath Severity:  Moderate Onset quality:  Sudden Timing:  Constant Chronicity:  Recurrent Context: activity   Relieved by:  Nothing Worsened by:  Nothing Ineffective treatments:  None tried Associated symptoms: no abdominal pain, no chest pain, no cough, no headaches and no rash        Home Medications Prior to Admission medications   Medication Sig Start Date End Date Taking? Authorizing Provider  albuterol (PROVENTIL) (2.5 MG/3ML) 0.083% nebulizer solution Inhale 3 mLs (2.5 mg total) into the lungs every 6 (six) hours as needed for wheezing or shortness of breath. 07/01/23  Yes Tyrone Nine, MD  albuterol (VENTOLIN HFA) 108 (90 Base) MCG/ACT inhaler Inhale 2 puffs into the lungs every 6 (six) hours as needed. 06/18/23  Yes [provider]  BREZTRI AEROSPHERE 160-9-4.8 MCG/ACT AERO Inhale 2 puffs into the lungs 2 (two) times daily. 05/17/22  Yes [provider]  clonazePAM (KLONOPIN) 1 MG tablet Take 1 mg by mouth 2 (two) times daily. 05/18/22  Yes [provider]  furosemide (LASIX) 40 MG tablet Take 1 tablet (40 mg total) by mouth daily. Patient taking differently: Take 40 mg by mouth as needed for fluid or edema. 03/09/23  Yes Vassie Loll, MD  hydrochlorothiazide (HYDRODIURIL) 25 MG tablet Take 25 mg by mouth daily. 08/14/23  Yes [provider]  omeprazole (PRILOSEC) 40 MG  capsule Take by mouth daily. 08/06/23  Yes [provider]  oxyCODONE (ROXICODONE) 15 MG immediate release tablet Take 15 mg by mouth every 4 (four) hours as needed.   Yes [provider]  OXYGEN Inhale 3 L into the lungs continuous.    Yes [provider]      Allergies    Ibuprofen    Review of Systems   Review of Systems  Constitutional:  Negative for appetite change and fatigue.  HENT:  Negative for congestion, ear discharge and sinus pressure.   Eyes:  Negative for discharge.  Respiratory:  Positive for shortness of breath. Negative for cough.   Cardiovascular:  Negative for chest pain.  Gastrointestinal:  Negative for abdominal pain and diarrhea.  Genitourinary:  Negative for frequency and hematuria.  Musculoskeletal:  Negative for back pain.  Skin:  Negative for rash.  Neurological:  Negative for seizures and headaches.  Psychiatric/Behavioral:  Negative for hallucinations.     Physical Exam Updated Vital Signs BP 131/75   Pulse 78   Temp 99 F (37.2 C) (Oral)   Resp (!) 23   Ht 5\' 6"  (1.676 m)   Wt 74.8 kg   SpO2 98%   BMI 26.63 kg/m  Physical Exam Vitals and nursing note reviewed.  Constitutional:      Appearance: He is well-developed.  HENT:     Head: Normocephalic.     Nose: Nose normal.  Eyes:  General: No scleral icterus.    Conjunctiva/sclera: Conjunctivae normal.  Neck:     Thyroid: No thyromegaly.  Cardiovascular:     Rate and Rhythm: Normal rate and regular rhythm.     Heart sounds: No murmur heard.    No friction rub. No gallop.  Pulmonary:     Breath sounds: No stridor. Wheezing present. No rales.  Chest:     Chest wall: No tenderness.  Abdominal:     General: There is no distension.     Tenderness: There is no abdominal tenderness. There is no rebound.  Musculoskeletal:        General: Normal range of motion.     Cervical back: Neck supple.  Lymphadenopathy:     Cervical: No cervical adenopathy.  Skin:     Findings: No erythema or rash.  Neurological:     Mental Status: He is alert and oriented to person, place, and time.     Motor: No abnormal muscle tone.     Coordination: Coordination normal.  Psychiatric:        Behavior: Behavior normal.     ED Results / Procedures / Treatments   Labs (all labs ordered are listed, but only abnormal results are displayed) Labs Reviewed  CBC WITH DIFFERENTIAL/PLATELET - Abnormal; Notable for the following components:      Result Value   Hemoglobin 12.5 (*)    All other components within normal limits  COMPREHENSIVE METABOLIC PANEL - Abnormal; Notable for the following components:   Chloride 92 (*)    CO2 37 (*)    Creatinine, Ser 0.49 (*)    AST 13 (*)    All other components within normal limits  BRAIN NATRIURETIC PEPTIDE    EKG None  Radiology DG Chest 2 View  Result Date: 09/13/2023 CLINICAL DATA:  Shortness of breath.  History of lung cancer EXAM: CHEST - 2 VIEW COMPARISON:  June 25, 2023. FINDINGS: The heart size and mediastinal contours are within normal limits. Right basilar nodular density is again noted as described on prior CT scan. Left lung is unremarkable. The visualized skeletal structures are unremarkable. IMPRESSION: Right basilar nodular density is again noted as described as potential malignancy on prior CT scan. Electronically Signed   By: Lupita Raider M.D.   On: 09/13/2023 10:25    Procedures Procedures    Medications Ordered in ED Medications  ipratropium-albuterol (DUONEB) 0.5-2.5 (3) MG/3ML nebulizer solution 3 mL (3 mLs Nebulization Given 09/13/23 0923)  albuterol (PROVENTIL) (2.5 MG/3ML) 0.083% nebulizer solution 2.5 mg (2.5 mg Nebulization Given 09/13/23 0923)  magnesium sulfate IVPB 2 g 50 mL (0 g Intravenous Stopped 09/13/23 1427)  ipratropium-albuterol (DUONEB) 0.5-2.5 (3) MG/3ML nebulizer solution 3 mL (3 mLs Nebulization Given 09/13/23 1359)  albuterol (PROVENTIL) (2.5 MG/3ML) 0.083% nebulizer  solution 2.5 mg (2.5 mg Nebulization Given 09/13/23 1359)  methylPREDNISolone sodium succinate (SOLU-MEDROL) 125 mg/2 mL injection 125 mg (125 mg Intravenous Given 09/13/23 1324)  iohexol (OMNIPAQUE) 350 MG/ML injection 75 mL (75 mLs Intravenous Contrast Given 09/13/23 1429)    ED Course/ Medical Decision Making/ A&P                                 Medical Decision Making Amount and/or Complexity of Data Reviewed Labs: ordered. Radiology: ordered.  Risk Prescription drug management. Decision regarding hospitalization.   Patient with shortness of breath and exacerbation of COPD.  CT scan pending.  Patient will  be admitted to medicine        Final Clinical Impression(s) / ED Diagnoses Final diagnoses:  None    Rx / DC Orders ED Discharge Orders     None         Bethann Berkshire, MD 09/14/23 1806

## 2023-09-13 NOTE — ED Triage Notes (Addendum)
Pt bib [facility] for SOB. Pt states he has a hx of lung cancer, COPD, CHF. Pt states he woke up this morning more SOB than normal. Pt normally wears 3 liter of O2 at home, pt is currently on 4 liters. EMS placed an 18 g in R AC. Pt states he used cocaine yesterday.

## 2023-09-13 NOTE — ED Notes (Signed)
Pt maintained 98% with 5l of O2 ambulating down the hall

## 2023-09-14 DIAGNOSIS — J441 Chronic obstructive pulmonary disease with (acute) exacerbation: Secondary | ICD-10-CM | POA: Diagnosis not present

## 2023-09-14 LAB — COMPREHENSIVE METABOLIC PANEL
ALT: 10 U/L (ref 0–44)
AST: 14 U/L — ABNORMAL LOW (ref 15–41)
Albumin: 3.3 g/dL — ABNORMAL LOW (ref 3.5–5.0)
Alkaline Phosphatase: 64 U/L (ref 38–126)
Anion gap: 7 (ref 5–15)
BUN: 13 mg/dL (ref 8–23)
CO2: 37 mmol/L — ABNORMAL HIGH (ref 22–32)
Calcium: 8.7 mg/dL — ABNORMAL LOW (ref 8.9–10.3)
Chloride: 93 mmol/L — ABNORMAL LOW (ref 98–111)
Creatinine, Ser: 0.68 mg/dL (ref 0.61–1.24)
GFR, Estimated: >60 mL/min (ref >60)
Glucose, Bld: 195 mg/dL — ABNORMAL HIGH (ref 70–99)
Potassium: 4 mmol/L (ref 3.5–5.1)
Sodium: 137 mmol/L (ref 135–145)
Total Bilirubin: 0.3 mg/dL (ref <1.2)
Total Protein: 6.3 g/dL — ABNORMAL LOW (ref 6.5–8.1)

## 2023-09-14 LAB — MAGNESIUM: Magnesium: 1.9 mg/dL (ref 1.7–2.4)

## 2023-09-14 LAB — CBC
HCT: 38.2 % — ABNORMAL LOW (ref 39.0–52.0)
Hemoglobin: 11.9 g/dL — ABNORMAL LOW (ref 13.0–17.0)
MCH: 29.3 pg (ref 26.0–34.0)
MCHC: 31.2 g/dL (ref 30.0–36.0)
MCV: 94.1 fL (ref 80.0–100.0)
Platelets: 194 10*3/uL (ref 150–400)
RBC: 4.06 MIL/uL — ABNORMAL LOW (ref 4.22–5.81)
RDW: 11.7 % (ref 11.5–15.5)
WBC: 5.2 10*3/uL (ref 4.0–10.5)
nRBC: 0 % (ref 0.0–0.2)

## 2023-09-14 LAB — PHOSPHORUS: Phosphorus: 2.6 mg/dL (ref 2.5–4.6)

## 2023-09-14 MED ORDER — ALBUTEROL SULFATE (2.5 MG/3ML) 0.083% IN NEBU: 2.5000 mg | INHALATION_SOLUTION | RESPIRATORY_TRACT | Status: DC | PRN

## 2023-09-14 MED ORDER — GUAIFENESIN 100 MG/5ML PO LIQD
10.0000 mL | Freq: Once | ORAL | Status: AC
  Administered 2023-09-14: 10 mL via ORAL
  Filled 2023-09-14: qty 10

## 2023-09-14 NOTE — Progress Notes (Signed)
Pt refused pna and influenza vaccines.

## 2023-09-14 NOTE — Care Management Obs Status (Signed)
MEDICARE OBSERVATION STATUS NOTIFICATION   Patient Details  Name: Miguel Marsh MRN: [id number] Date of Birth: May 03, 1961   Medicare Observation Status Notification Given:  Yes    [name] 09/14/2023, 3:59 PM

## 2023-09-14 NOTE — Progress Notes (Signed)
PROGRESS NOTE     Miguel Marsh, is a 62 y.o. male, DOB - 06-03-1961, UYQ:[id number]  Admit date - 09/13/2023   Admitting Physician Frankey Shown, DO  Outpatient Primary MD for the patient is [name], NP  LOS - 0  Chief Complaint  Patient presents with   Shortness of Breath      Brief Narrative:  62 y.o. male with medical history significant of GERD, COPD hypertension,  chronic hypoxic respiratory failure on 3 LPM of oxygen at baseline, HFpEF, history of metastatic NSCLC (follows with [facility]), ongoing tobacco abuse admitted on 09/13/2023 with acute COPD exacerbation in setting of underlying lung malignancy with worsening hypoxic respiratory failure    -Assessment and Plan: 1) acute COPD exacerbation--- -Cough and wheezing persist -Becomes very dyspneic and more wheezy  with ambulation to Bathroom -Continue IV Solu-Medrol -Continue azithromycin, bronchodilators, mucolytics  2) recurrent lung cancer--- initially diagnosed with lung cancer in 2016 -RLL cavitary nodule, history of NSCLC: 9mm RUL GGO, 1.9x1.8cm irregular potentially cavitary RLL nodule on CTA 06/25/2023  -Lung nodule-1.9 x 1.9 cm irregular nodule in the right lower lobe is similar in size and demonstration and now solid appearance without focal central lucency these are concerning for malignancy.  -Outpatient follow-up with [name] advised  3) acute on chronic hypoxic respiratory failure--- at baseline patient uses 2 L of oxygen via nasal cannula Cough and wheezing persist -Becomes very dyspneic and more wheezy  with ambulation to Bathroom -Oxygen has been weaned down to 4 L of oxygen via nasal cannula at this time  4)Tobacco Abuse--- smoking cessation advised Smoking cessation counseling for 4 minutes today,  -patient declines Nicotine patch I have discussed tobacco cessation with the patient.  I have counseled the patient regarding the  negative impacts of continued tobacco use including but not limited to lung cancer, COPD, and cardiovascular disease.  I have discussed alternatives to tobacco and modalities that may help facilitate tobacco cessation including but not limited to biofeedback, hypnosis, and medications.  Total time spent with tobacco counseling was 4 minutes.  5)GERD--- continue Protonix especially while on high-dose steroids  6) cocaine abuse--patient is not ready to quit  7) chronic pain syndrome--- continue PTA pain regimen  Status is: Inpatient   Disposition: The patient is from: Home              Anticipated d/c is to: Home              Anticipated d/c date is: 1 day              Patient currently is not medically stable to d/c. Barriers: Not Clinically Stable-   Code Status :  -  Code Status: Full Code   Family Communication:    NA (patient is alert, awake and coherent)   DVT Prophylaxis  :   - SCDs  enoxaparin (LOVENOX) injection 40 mg Start: 09/13/23 2200 SCDs Start: 09/13/23 2100   Lab Results  Component Value Date   PLT 194 09/14/2023   Inpatient Medications  Scheduled Meds:  azithromycin  250 mg Oral Daily   dextromethorphan-guaiFENesin  1 tablet Oral BID   enoxaparin (LOVENOX) injection  40 mg Subcutaneous Q24H   guaiFENesin  10 mL Oral Once   influenza vac split trivalent PF  0.5 mL Intramuscular Tomorrow-1000   ipratropium-albuterol  3 mL Nebulization Q6H   methylPREDNISolone (SOLU-MEDROL) injection  40 mg Intravenous Q12H   pantoprazole  40  mg Oral Daily   pneumococcal 20-valent conjugate vaccine  0.5 mL Intramuscular Tomorrow-1000   Continuous Infusions: PRN Meds:.acetaminophen **OR** acetaminophen, albuterol, ondansetron **OR** ondansetron (ZOFRAN) IV, oxyCODONE   Anti-infectives (From admission, onward)    Start     Dose/Rate Route Frequency Ordered Stop   09/14/23 1000  azithromycin (ZITHROMAX) tablet 250 mg       Placed in "Followed by" Linked Group   250 mg Oral  Daily 09/13/23 2033 09/18/23 0959   09/13/23 2045  azithromycin (ZITHROMAX) tablet 500 mg       Placed in "Followed by" Linked Group   500 mg Oral Daily 09/13/23 2033 09/13/23 2159      Subjective: Miguel Marsh today has no fevers, no emesis,  No chest pain,   -Cough and wheezing persist -Becomes very dyspneic and more wheezy  with ambulation to Bathroom   Objective: Vitals:   09/14/23 0509 09/14/23 0900 09/14/23 1405 09/14/23 1431  BP: 120/66  132/76   Pulse: 71  76   Resp: 20  19   Temp: 98 F (36.7 C)  98.4 F (36.9 C)   TempSrc: Oral  Oral   SpO2: 94% 92% 92% 99%  Weight:      Height:        Intake/Output Summary (Last 24 hours) at 09/14/2023 1554 Last data filed at 09/14/2023 1252 Gross per 24 hour  Intake 957 ml  Output 560 ml  Net 397 ml   Filed Weights   09/13/23 0831 09/13/23 2157  Weight: 74.8 kg 70.2 kg    Physical Exam  Gen:- Awake Alert, no conversational dyspnea HEENT:- [place].AT, No sclera icterus Nose-  4 L/min Neck-Supple Neck,No JVD,.  Lungs-diminished breath sounds, scattered wheezes  CV- S1, S2 normal, regular  Abd-  +ve B.Sounds, Abd Soft, No tenderness,    Extremity/Skin:- No  edema, pedal pulses present  Psych-affect is appropriate, oriented x3 Neuro-no new focal deficits, no tremors  Data Reviewed: I have personally reviewed following labs and imaging studies  CBC: Recent Labs  Lab 09/13/23 0914 09/14/23 0451  WBC 6.6 5.2  NEUTROABS 4.7  --   HGB 12.5* 11.9*  HCT 40.7 38.2*  MCV 95.3 94.1  PLT 170 194   Basic Metabolic Panel: Recent Labs  Lab 09/13/23 0914 09/14/23 0451  NA 139 137  K 3.7 4.0  CL 92* 93*  CO2 37* 37*  GLUCOSE 89 195*  BUN 9 13  CREATININE 0.49* 0.68  CALCIUM 8.9 8.7*  MG  --  1.9  PHOS  --  2.6   GFR: Estimated Creatinine Clearance: 86.4 mL/min (by C-G formula based on SCr of 0.68 mg/dL). Liver Function Tests: Recent Labs  Lab 09/13/23 0914 09/14/23 0451  AST 13* 14*  ALT 10 10   ALKPHOS 53 64  BILITOT 0.6 0.3  PROT 6.5 6.3*  ALBUMIN 3.6 3.3*   Radiology Studies: CT Angio Chest PE W and/or Wo Contrast  Result Date: 09/13/2023 CLINICAL DATA:  Pulmonary embolism (PE) suspected, high prob. History of lung cancer. EXAM: CT ANGIOGRAPHY CHEST WITH CONTRAST TECHNIQUE: Multidetector CT imaging of the chest was performed using the standard protocol during bolus administration of intravenous contrast. Multiplanar CT image reconstructions and MIPs were obtained to evaluate the vascular anatomy. RADIATION DOSE REDUCTION: This exam was performed according to the departmental dose-optimization program which includes automated exposure control, adjustment of the mA and/or kV according to patient size and/or use of iterative reconstruction technique. CONTRAST:  75mL OMNIPAQUE IOHEXOL 350 MG/ML SOLN COMPARISON:  CTA chest dated June 25, 2023. FINDINGS: Cardiovascular: Satisfactory opacification of the pulmonary arteries to the segmental level. No evidence of pulmonary embolism. Normal heart size. No pericardial effusion. Multivessel coronary artery calcifications. Mild atherosclerosis of the thoracic aorta. Mediastinum/Nodes: No enlarged mediastinal, hilar, or axillary lymph nodes. Thyroid gland, trachea, and esophagus demonstrate no significant findings. Lungs/Pleura: Upper lobe predominant centrilobular and paraseptal emphysema. The previously described irregular potentially cavitary nodule in the right lower lobe is now solid in appearance without focal central lucency and measures 1.9 x 1.9 cm (series 14, image 115), previously 1.9 x 1.8 cm. This demonstrates similar adjacent branching in the right lower lobe. Essentially unchanged 8 mm ground-glass nodular opacity in the right upper lobe (series 14, image 43). Similar bronchial wall thickening bilaterally with cylindrical bronchiectasis. No pleural effusion. No pneumothorax. Upper Abdomen: No acute abnormality. Stable left upper pole renal  cyst. Musculoskeletal: No chest wall abnormality. No acute or significant osseous findings. Review of the MIP images confirms the above findings. IMPRESSION: 1. No evidence of acute pulmonary embolism. 2. 1.9 x 1.9 cm irregular nodule in the right lower lobe is similar in size and demonstrates a now solid appearance without focal central lucency. These findings are concerning for malignancy. 3. Essentially unchanged 8 mm ground-glass nodular opacity in the right upper lobe. Recommend continued attention on follow-up imaging. 4. Similar bronchial wall thickening with cylindrical bronchiectasis. 5. Aortic Atherosclerosis (ICD10-I70.0) and Emphysema (ICD10-J43.9). Electronically Signed   By: Hart Robinsons M.D.   On: 09/13/2023 18:12   DG Chest 2 View  Result Date: 09/13/2023 CLINICAL DATA:  Shortness of breath.  History of lung cancer EXAM: CHEST - 2 VIEW COMPARISON:  June 25, 2023. FINDINGS: The heart size and mediastinal contours are within normal limits. Right basilar nodular density is again noted as described on prior CT scan. Left lung is unremarkable. The visualized skeletal structures are unremarkable. IMPRESSION: Right basilar nodular density is again noted as described as potential malignancy on prior CT scan. Electronically Signed   By: Lupita Raider M.D.   On: 09/13/2023 10:25    Scheduled Meds:  azithromycin  250 mg Oral Daily   dextromethorphan-guaiFENesin  1 tablet Oral BID   enoxaparin (LOVENOX) injection  40 mg Subcutaneous Q24H   guaiFENesin  10 mL Oral Once   influenza vac split trivalent PF  0.5 mL Intramuscular Tomorrow-1000   ipratropium-albuterol  3 mL Nebulization Q6H   methylPREDNISolone (SOLU-MEDROL) injection  40 mg Intravenous Q12H   pantoprazole  40 mg Oral Daily   pneumococcal 20-valent conjugate vaccine  0.5 mL Intramuscular Tomorrow-1000   Continuous Infusions:   LOS: 0 days   Shon Hale M.D on 09/14/2023 at 3:54 PM  Go to [email or web address] - for contact  info  [facility] - Office  [phone]  If 7PM-7AM, please contact night-coverage [email or web address] 09/14/2023, 3:54 PM

## 2023-09-14 NOTE — Plan of Care (Signed)

## 2023-09-14 NOTE — Progress Notes (Signed)
Patient suffers from COPD which impairs their ability to perform daily activities like ambulating in the home. A walker will not resolve issue with performing activities of daily living. A wheelchair will allow patient to safely perform daily activities. Patient can safely propel the wheelchair in the home or has a caregiver who can provide assistance.

## 2023-09-14 NOTE — Plan of Care (Signed)
  Problem: Clinical Measurements: Goal: Respiratory complications will improve Outcome: Not Progressing   Problem: Pain Management: Goal: General experience of comfort will improve Outcome: Not Progressing

## 2023-09-14 NOTE — Progress Notes (Signed)
Pt reports he has an aid for about 20 hours a week. He is requesting wheelchair. No preference on agency. Referred to [facility] with [employer].   Transition of Care Department [facility]) has reviewed patient and no TOC needs have been identified at this time. We will continue to monitor patient advancement through interdisciplinary progression rounds. If new patient transition needs arise, please place a TOC consult.   09/14/23 0723  TOC Brief Assessment  Insurance and Status Reviewed  Patient has primary care physician Yes  Home environment has been reviewed Lives alone.  Prior level of function: Independent.  Prior/Current Home Services No current home services  Social Determinants of Health Reivew SDOH reviewed no interventions necessary  Readmission risk has been reviewed Yes  Transition of care needs no transition of care needs at this time

## 2023-09-15 DIAGNOSIS — J441 Chronic obstructive pulmonary disease with (acute) exacerbation: Secondary | ICD-10-CM | POA: Diagnosis not present

## 2023-09-15 MED ORDER — FUROSEMIDE 20 MG PO TABS
20.0000 mg | ORAL_TABLET | Freq: Every day | ORAL | Status: DC
  Administered 2023-09-15 – 2023-09-16 (×2): 20 mg via ORAL
  Filled 2023-09-15 (×2): qty 1

## 2023-09-15 MED ORDER — ALPRAZOLAM 0.5 MG PO TABS
0.5000 mg | ORAL_TABLET | Freq: Once | ORAL | Status: AC
  Administered 2023-09-15: 0.5 mg via ORAL
  Filled 2023-09-15: qty 1

## 2023-09-15 MED ORDER — IPRATROPIUM-ALBUTEROL 0.5-2.5 (3) MG/3ML IN SOLN
3.0000 mL | Freq: Four times a day (QID) | RESPIRATORY_TRACT | Status: DC
  Administered 2023-09-16 (×2): 3 mL via RESPIRATORY_TRACT
  Filled 2023-09-15 (×2): qty 3

## 2023-09-15 NOTE — Plan of Care (Signed)
  Problem: Clinical Measurements: Goal: Respiratory complications will improve Outcome: Progressing   Problem: Activity: Goal: Risk for activity intolerance will decrease Outcome: Progressing   Problem: Pain Management: Goal: General experience of comfort will improve Outcome: Progressing   Problem: Safety: Goal: Ability to remain free from injury will improve Outcome: Progressing   Problem: Skin Integrity: Goal: Risk for impaired skin integrity will decrease Outcome: Progressing

## 2023-09-15 NOTE — Progress Notes (Signed)
PROGRESS NOTE     Miguel Marsh, is a 62 y.o. male, DOB - January 15, 1961, WUJ:[id number]  Admit date - 09/13/2023   Admitting Physician Frankey Shown, DO  Outpatient Primary MD for the patient is [name], NP  LOS - 0  Chief Complaint  Patient presents with   Shortness of Breath      Brief Narrative:  62 y.o. male with medical history significant of GERD, COPD hypertension,  chronic hypoxic respiratory failure on 3 LPM of oxygen at baseline, HFpEF, history of metastatic NSCLC (follows with [facility]), ongoing tobacco abuse admitted on 09/13/2023 with acute COPD exacerbation in setting of underlying lung malignancy with worsening hypoxic respiratory failure    -Assessment and Plan: 1)Acute COPD exacerbation--- -Cough and wheezing persist -Becomes very dyspneic and more wheezy  with ambulation to Bathroom -Continue IV Solu-Medrol -Continue azithromycin, bronchodilators, mucolytics  2)Recurrent Lung Cancer--- initially diagnosed with lung cancer in 2016 -RLL cavitary nodule, history of NSCLC: 9mm RUL GGO, 1.9x1.8cm irregular potentially cavitary RLL nodule on CTA 06/25/2023  -Lung nodule-1.9 x 1.9 cm irregular nodule in the right lower lobe is similar in size and demonstration and now solid appearance without focal central lucency these are concerning for malignancy.  -Outpatient follow-up with his oncologist at [facility] advised  3)Acute on chronic hypoxic respiratory failure--- at baseline patient uses 2 L of oxygen via nasal cannula Cough and wheezing persist -Becomes very dyspneic and more wheezy  with ambulation to Bathroom -Oxygen has been weaned down to 4 L of oxygen via nasal cannula at this time  4)HFpEF-Chronic Diastolic Dysfunction CHF --Echo from 03/06/2023 with EF of 60 to 65% -Prior echo from 2019 was consistent with EF of 65 to 60% and grade 1 diastolic dysfunction -Lasix 20 mg daily  5)GERD--- continue Protonix especially  while on high-dose steroids  6)Cocaine Abuse--patient is not ready to quit  7)Chronic Pain Syndrome--- continue PTA pain regimen  8)Tobacco Abuse--- smoking cessation advised -patient declines Nicotine patch  Disposition: The patient is from: Home              Anticipated d/c is to: Home              Anticipated d/c date is: 1 day              Patient currently is not medically stable to d/c. Barriers: Not Clinically Stable-   Code Status :  -  Code Status: Full Code   Family Communication:    NA (patient is alert, awake and coherent)   DVT Prophylaxis  :   - SCDs  enoxaparin (LOVENOX) injection 40 mg Start: 09/13/23 2200 SCDs Start: 09/13/23 2100  Lab Results  Component Value Date   PLT 194 09/14/2023   Inpatient Medications  Scheduled Meds:  azithromycin  250 mg Oral Daily   dextromethorphan-guaiFENesin  1 tablet Oral BID   enoxaparin (LOVENOX) injection  40 mg Subcutaneous Q24H   influenza vac split trivalent PF  0.5 mL Intramuscular Tomorrow-1000   ipratropium-albuterol  3 mL Nebulization Q6H   methylPREDNISolone (SOLU-MEDROL) injection  40 mg Intravenous Q12H   pantoprazole  40 mg Oral Daily   pneumococcal 20-valent conjugate vaccine  0.5 mL Intramuscular Tomorrow-1000   Continuous Infusions: PRN Meds:.acetaminophen **OR** acetaminophen, albuterol, ondansetron **OR** ondansetron (ZOFRAN) IV, oxyCODONE  Anti-infectives (From admission, onward)    Start     Dose/Rate Route Frequency Ordered Stop   09/14/23 1000  azithromycin (ZITHROMAX) tablet 250 mg  Placed in "Followed by" Linked Group   250 mg Oral Daily 09/13/23 2033 09/18/23 0959   09/13/23 2045  azithromycin (ZITHROMAX) tablet 500 mg       Placed in "Followed by" Linked Group   500 mg Oral Daily 09/13/23 2033 09/13/23 2159      Subjective: Miguel Marsh today has no fevers, no emesis,  No chest pain,   -Cough and wheezing persist -Becomes very dyspneic and more wheezy  with normal activity--with  increased work of breathing--- --mouth breathing often - Objective: Vitals:   09/15/23 1034 09/15/23 1303 09/15/23 1306 09/15/23 1319  BP: (!) 142/77  (!) 153/76   Pulse: 72  89   Resp: 19  16   Temp: 98.2 F (36.8 C)  98.4 F (36.9 C)   TempSrc: Oral  Oral   SpO2: 94% 95% 95% 94%  Weight:      Height:        Intake/Output Summary (Last 24 hours) at 09/15/2023 1632 Last data filed at 09/15/2023 1630 Gross per 24 hour  Intake 1680 ml  Output 1730 ml  Net -50 ml   Filed Weights   09/13/23 0831 09/13/23 2157  Weight: 74.8 kg 70.2 kg    Physical Exam  Gen:- Awake Alert, No conversational dyspnea, significant dyspnea on exertion HEENT:- [place].AT, No sclera icterus Nose- [place] 4 L/min Neck-Supple Neck,No JVD,.  Lungs-diminished breath sounds, scattered wheezes  CV- S1, S2 normal, regular  Abd-  +ve B.Sounds, Abd Soft, No tenderness,    Extremity/Skin:- No  edema, pedal pulses present  Psych-affect is appropriate, oriented x3 Neuro-no new focal deficits, no tremors  Data Reviewed: I have personally reviewed following labs and imaging studies  CBC: Recent Labs  Lab 09/13/23 0914 09/14/23 0451  WBC 6.6 5.2  NEUTROABS 4.7  --   HGB 12.5* 11.9*  HCT 40.7 38.2*  MCV 95.3 94.1  PLT 170 194   Basic Metabolic Panel: Recent Labs  Lab 09/13/23 0914 09/14/23 0451  NA 139 137  K 3.7 4.0  CL 92* 93*  CO2 37* 37*  GLUCOSE 89 195*  BUN 9 13  CREATININE 0.49* 0.68  CALCIUM 8.9 8.7*  MG  --  1.9  PHOS  --  2.6   GFR: Estimated Creatinine Clearance: 86.4 mL/min (by C-G formula based on SCr of 0.68 mg/dL). Liver Function Tests: Recent Labs  Lab 09/13/23 0914 09/14/23 0451  AST 13* 14*  ALT 10 10  ALKPHOS 53 64  BILITOT 0.6 0.3  PROT 6.5 6.3*  ALBUMIN 3.6 3.3*   Scheduled Meds:  azithromycin  250 mg Oral Daily   dextromethorphan-guaiFENesin  1 tablet Oral BID   enoxaparin (LOVENOX) injection  40 mg Subcutaneous Q24H   influenza vac split trivalent PF  0.5 mL  Intramuscular Tomorrow-1000   ipratropium-albuterol  3 mL Nebulization Q6H   methylPREDNISolone (SOLU-MEDROL) injection  40 mg Intravenous Q12H   pantoprazole  40 mg Oral Daily   pneumococcal 20-valent conjugate vaccine  0.5 mL Intramuscular Tomorrow-1000   Continuous Infusions:   LOS: 0 days   Shon Hale M.D on 09/15/2023 at 4:32 PM  Go to [email or web address] - for contact info  [facility] - Office  [phone]  If 7PM-7AM, please contact night-coverage [email or web address] 09/15/2023, 4:32 PM

## 2023-09-15 NOTE — Plan of Care (Signed)
  Problem: Clinical Measurements: Goal: Respiratory complications will improve Outcome: Not Progressing   Problem: Activity: Goal: Risk for activity intolerance will decrease Outcome: Not Progressing   Problem: Pain Management: Goal: General experience of comfort will improve Outcome: Not Progressing

## 2023-09-16 DIAGNOSIS — J441 Chronic obstructive pulmonary disease with (acute) exacerbation: Secondary | ICD-10-CM | POA: Diagnosis not present

## 2023-09-16 LAB — BASIC METABOLIC PANEL
Anion gap: 9 (ref 5–15)
BUN: 12 mg/dL (ref 8–23)
CO2: 34 mmol/L — ABNORMAL HIGH (ref 22–32)
Calcium: 8.9 mg/dL (ref 8.9–10.3)
Chloride: 93 mmol/L — ABNORMAL LOW (ref 98–111)
Creatinine, Ser: 0.68 mg/dL (ref 0.61–1.24)
GFR, Estimated: >60 mL/min (ref >60)
Glucose, Bld: 142 mg/dL — ABNORMAL HIGH (ref 70–99)
Potassium: 4.4 mmol/L (ref 3.5–5.1)
Sodium: 136 mmol/L (ref 135–145)

## 2023-09-16 MED ORDER — PREDNISONE 20 MG PO TABS
40.0000 mg | ORAL_TABLET | Freq: Every day | ORAL | 0 refills | Status: AC
Start: 2023-09-16 — End: 2023-09-23

## 2023-09-16 MED ORDER — ALBUTEROL SULFATE (2.5 MG/3ML) 0.083% IN NEBU: 2.5000 mg | INHALATION_SOLUTION | Freq: Four times a day (QID) | RESPIRATORY_TRACT | 3 refills | Status: DC | PRN

## 2023-09-16 MED ORDER — DOXYCYCLINE HYCLATE 100 MG PO TABS: 100.0000 mg | ORAL_TABLET | Freq: Two times a day (BID) | ORAL | 0 refills | Status: AC

## 2023-09-16 MED ORDER — OMEPRAZOLE 40 MG PO CPDR: 40.0000 mg | DELAYED_RELEASE_CAPSULE | Freq: Every day | ORAL | 3 refills | Status: DC

## 2023-09-16 MED ORDER — BREZTRI AEROSPHERE 160-9-4.8 MCG/ACT IN AERO: 2.0000 | INHALATION_SPRAY | Freq: Two times a day (BID) | RESPIRATORY_TRACT | 3 refills | Status: DC

## 2023-09-16 MED ORDER — ACETAMINOPHEN 325 MG PO TABS: 650.0000 mg | ORAL_TABLET | Freq: Four times a day (QID) | ORAL | 1 refills | Status: DC | PRN

## 2023-09-16 MED ORDER — DM-GUAIFENESIN ER 30-600 MG PO TB12: 1.0000 | ORAL_TABLET | Freq: Two times a day (BID) | ORAL | 0 refills | Status: AC

## 2023-09-16 MED ORDER — ALBUTEROL SULFATE HFA 108 (90 BASE) MCG/ACT IN AERS: 2.0000 | INHALATION_SPRAY | Freq: Four times a day (QID) | RESPIRATORY_TRACT | 3 refills | Status: DC | PRN

## 2023-09-16 NOTE — Discharge Instructions (Addendum)
1)You need oxygen at home at 3 L via nasal cannula continuously while awake and while asleep--- smoking or having open fires around oxygen can cause fire, significant injury and death 2) right lower lobe lung nodule ----abnormal findings on your chest imaging study showing 1.9 x 1.9 cm irregular nodule in the right lower lobe . These findings are concerning for malignancy--please follow-up with your oncologist at [facility]  -You need to see the pulmonologist for further investigation to make sure you do not have lung cancer 3) complete abstinence from tobacco advised

## 2023-09-16 NOTE — Discharge Summary (Signed)
Miguel Marsh, is a 62 y.o. male  DOB 1961-07-15  MRN [id number].  Admission date:  09/13/2023  Admitting Physician  Frankey Shown, DO  Discharge Date:  09/16/2023   Primary MD  [name], NP  Recommendations for primary care physician for things to follow:  1)You need oxygen at home at 3 L via nasal cannula continuously while awake and while asleep--- smoking or having open fires around oxygen can cause fire, significant injury and death 2) right lower lobe lung nodule ----abnormal findings on your chest imaging study showing 1.9 x 1.9 cm irregular nodule in the right lower lobe . These findings are concerning for malignancy--please follow-up with your oncologist at [facility]  -You need to see the pulmonologist for further investigation to make sure you do not have lung cancer 3) complete abstinence from tobacco advised  Admission Diagnosis  Acute exacerbation of chronic obstructive pulmonary disease (COPD) (HCC) [J44.1] COPD exacerbation (HCC) [J44.1]   Discharge Diagnosis  Acute exacerbation of chronic obstructive pulmonary disease (COPD) (HCC) [J44.1] COPD exacerbation (HCC) [J44.1]   Principal Problem:   Acute exacerbation of chronic obstructive pulmonary disease (COPD) (HCC) Active Problems:   GERD (gastroesophageal reflux disease)   Acute on chronic respiratory failure with hypoxia (HCC)   Lung nodule   Cocaine abuse (HCC)     Past Medical History:  Diagnosis Date   Arthritis    Bronchitis    Cancer (HCC)    metastatic NSCLC (followed by [facility])   COPD (chronic obstructive pulmonary disease) (HCC)    HTN (hypertension)    Past Surgical History:  Procedure Laterality Date   LUNG BIOPSY     TOTAL HIP ARTHROPLASTY     TUMOR REMOVAL Right 12/04/2017    HPI  from the history and physical done on the day of admission:   HPI: Miguel Marsh is a 62 y.o. male with  medical history significant of GERD, COPD hypertension,  chronic hypoxic respiratory failure on 3 LPM of oxygen at baseline, HFpEF, history of metastatic NSCLC (follows with [facility]), tobacco abuse who presents to the emergency department from home via EMS due to worsening shortness of breath.  Patient complained of several days of mild cough with occasional small amount of green sputum production and increasing shortness of breath.  He has a history of lung cancer which was in remission, but recently recurred and was going to follow-up at the cancer center in with insulin today, while dressing to go for his appointment, he noted worsening shortness of breath with increased work of breathing that resulted in activating EMS.  Patient has increased home oxygen use to 4 LPM from 3 LPM due to the worsening shortness of breath.  Patient was sent to the ED for further evaluation and management. Patient was admitted from 8/25 to 9/1 due to acute on chronic respiratory failure with hypoxia and hypercarbia secondary to COVID-pneumonia and COPD exacerbation   ED Course:  In the emergency department, patient was hemodynamically stable, BP was  149/76, O2 sat was 93% to 100% on supplemental oxygen via [place] at 4 LPM, other vital signs were within normal range.  Showed normocytic anemia.  BMP was normal except for chloride of 93, bicarb 37, creatinine 0.49, BNP 27.0.  X-ray showed right  basilar nodular density is again noted as described as potential malignancy on prior CT scan. CT angiography of chest with contrast showed 1.9 x 1.9 cm irregular nodule in the right lower lobe is similar in size and demonstrates a now solid appearance without focal central lucency. These findings are concerning for malignancy. Essentially unchanged 8 mm ground-glass nodular opacity in the right upper lobe. It was provided, Solu-Medrol to 9/1 mg x 1 was given, IV magnesium 2 g x 1 was given.  Hospitalist was asked to  admit patient for further evaluation and management.   Review of Systems: Review of systems as noted in the HPI. All other systems reviewed and are negative.   Hospital Course:    Brief Narrative:  62 y.o. male with medical history significant of GERD, COPD hypertension,  chronic hypoxic respiratory failure on 3 LPM of oxygen at baseline, HFpEF, history of metastatic NSCLC ([facility] ongoing tobacco abuse admitted on 09/13/2023 with acute COPD exacerbation in setting of underlying lung malignancy with worsening hypoxic respiratory failure    -Assessment and Plan: 1)Acute COPD exacerbation--- -Cough and wheezing improved significantly -Dyspnea on exertion has improved -Treated with IV Solu-Medrol, antibiotics, bronchodilators, mucolytics -Okay to discharge on doxycycline and prednisone   2)Recurrent Lung Cancer--- initially diagnosed with lung cancer in 2016 -RLL cavitary nodule, history of NSCLC: 9mm RUL GGO, 1.9x1.8cm irregular potentially cavitary RLL nodule on CTA 06/25/2023  -Lung nodule-1.9 x 1.9 cm irregular nodule in the right lower lobe is similar in size and demonstration and now solid appearance without focal central lucency these are concerning for malignancy.  -Outpatient follow-up with his oncologist at [facility] advised   3)Acute on chronic hypoxic respiratory failure--- at baseline patient uses 2 to  3 L of oxygen via nasal cannula -Cough dyspnea and wheezing has improved significantly -Oxygen has been weaned down to 2 L of oxygen via nasal cannula at this time   4)HFpEF-Chronic Diastolic Dysfunction CHF --Echo from 03/06/2023 with EF of 60 to 65% -Prior echo from 2019 was consistent with EF of 65 to 60% and grade 1 diastolic dysfunction -Lasix 20 mg daily   5)GERD--- continue omeprazole especially while on steroids   6)Cocaine Abuse--patient is not ready to quit   7)Chronic Pain Syndrome--- continue PTA pain regimen    8)Tobacco Abuse--- Smoking cessation counseling for 4 minutes today,   nicotine patch advised I have discussed tobacco cessation with the patient.  I have counseled the patient regarding the negative impacts of continued tobacco use including but not limited to lung cancer, COPD, and cardiovascular disease.  I have discussed alternatives to tobacco and modalities that may help facilitate tobacco cessation including but not limited to biofeedback, hypnosis, and medications.  Total time spent with tobacco counseling was 4 minutes.  Disposition: The patient is from: Home              Anticipated d/c is to: Home  Discharge Condition: stable  Follow UP   Follow-up Information     [facility] Follow up.   Why: Wheelchair will be delivered                Diet and Activity recommendation:  As advised  Discharge Instructions    Discharge Instructions     Call MD for:  difficulty breathing, headache or visual disturbances   Complete by: As directed    Call MD for:  persistant dizziness or light-headedness   Complete by: As directed    Call MD for:  persistant nausea and vomiting   Complete by: As directed    Call MD for:  temperature >100.4   Complete by: As directed    Diet - low sodium heart healthy   Complete by: As directed    Discharge instructions   Complete by: As directed    1)You need oxygen at home at 3 L via nasal cannula continuously while awake and while asleep--- smoking or having open fires around oxygen can cause fire, significant injury and death 2) right lower lobe lung nodule ----abnormal findings on your chest imaging study showing 1.9 x 1.9 cm irregular nodule in the right lower lobe . These findings are concerning for malignancy--please follow-up with your oncologist at [facility]  -You need to see the pulmonologist for further investigation to make sure you do not have lung cancer 3) complete abstinence from tobacco advised   Increase  activity slowly   Complete by: As directed        Discharge Medications     Allergies as of 09/16/2023       Reactions   Ibuprofen Shortness Of Breath        Medication List     STOP taking these medications    hydrochlorothiazide 25 MG tablet Commonly known as: HYDRODIURIL       TAKE these medications    acetaminophen 325 MG tablet Commonly known as: TYLENOL Take 2 tablets (650 mg total) by mouth every 6 (six) hours as needed for mild pain (pain score 1-3) (or Fever >/= 101).   albuterol (2.5 MG/3ML) 0.083% nebulizer solution Commonly known as: PROVENTIL Inhale 3 mLs (2.5 mg total) into the lungs every 6 (six) hours as needed for wheezing or shortness of breath.   albuterol 108 (90 Base) MCG/ACT inhaler Commonly known as: VENTOLIN HFA Inhale 2 puffs into the lungs every 6 (six) hours as needed.   Breztri Aerosphere 160-9-4.8 MCG/ACT Aero Generic drug: Budeson-Glycopyrrol-Formoterol Inhale 2 puffs into the lungs 2 (two) times daily.   clonazePAM 1 MG tablet Commonly known as: KLONOPIN Take 1 mg by mouth 2 (two) times daily.   dextromethorphan-guaiFENesin 30-600 MG 12hr tablet Commonly known as: MUCINEX DM Take 1 tablet by mouth 2 (two) times daily for 10 days.   doxycycline 100 MG tablet Commonly known as: VIBRA-TABS Take 1 tablet (100 mg total) by mouth 2 (two) times daily for 5 days.   furosemide 40 MG tablet Commonly known as: LASIX Take 1 tablet (40 mg total) by mouth daily. What changed:  when to take this reasons to take this   omeprazole 40 MG capsule Commonly known as: PRILOSEC Take 1 capsule (40 mg total) by mouth daily. What changed: how much to take   oxyCODONE 15 MG immediate release tablet Commonly known as: ROXICODONE Take 15 mg by mouth every 4 (four) hours as needed.   OXYGEN Inhale 3 L into the lungs continuous.   predniSONE 20 MG tablet Commonly known as: DELTASONE Take 2 tablets (40 mg total) by mouth daily with  breakfast for 7 days.               Durable Medical Equipment  (From admission, onward)  Start     Ordered   09/14/23 0754  For home use only DME standard manual wheelchair with seat cushion  Once       Comments: Patient suffers from Lung Cancer and COPD with Chronic Hypoxic Resp Failure on home oxygen which impairs their ability to perform daily activities like bathing, dressing, grooming, and toileting in the home.  A cane, crutch, or walker will not resolve issue with performing activities of daily living. A wheelchair will allow patient to safely perform daily activities. Patient can safely propel the wheelchair in the home or has a caregiver who can provide assistance. Length of need Lifetime. Accessories: elevating leg rests (ELRs), wheel locks, extensions and anti-tippers.   09/14/23 0756           Major procedures and Radiology Reports - PLEASE review detailed and final reports for all details, in brief -   CT Angio Chest PE W and/or Wo Contrast  Result Date: 09/13/2023 CLINICAL DATA:  Pulmonary embolism (PE) suspected, high prob. History of lung cancer. EXAM: CT ANGIOGRAPHY CHEST WITH CONTRAST TECHNIQUE: Multidetector CT imaging of the chest was performed using the standard protocol during bolus administration of intravenous contrast. Multiplanar CT image reconstructions and MIPs were obtained to evaluate the vascular anatomy. RADIATION DOSE REDUCTION: This exam was performed according to the departmental dose-optimization program which includes automated exposure control, adjustment of the mA and/or kV according to patient size and/or use of iterative reconstruction technique. CONTRAST:  75mL OMNIPAQUE IOHEXOL 350 MG/ML SOLN COMPARISON:  CTA chest dated June 25, 2023. FINDINGS: Cardiovascular: Satisfactory opacification of the pulmonary arteries to the segmental level. No evidence of pulmonary embolism. Normal heart size. No pericardial effusion. Multivessel  coronary artery calcifications. Mild atherosclerosis of the thoracic aorta. Mediastinum/Nodes: No enlarged mediastinal, hilar, or axillary lymph nodes. Thyroid gland, trachea, and esophagus demonstrate no significant findings. Lungs/Pleura: Upper lobe predominant centrilobular and paraseptal emphysema. The previously described irregular potentially cavitary nodule in the right lower lobe is now solid in appearance without focal central lucency and measures 1.9 x 1.9 cm (series 14, image 115), previously 1.9 x 1.8 cm. This demonstrates similar adjacent branching in the right lower lobe. Essentially unchanged 8 mm ground-glass nodular opacity in the right upper lobe (series 14, image 43). Similar bronchial wall thickening bilaterally with cylindrical bronchiectasis. No pleural effusion. No pneumothorax. Upper Abdomen: No acute abnormality. Stable left upper pole renal cyst. Musculoskeletal: No chest wall abnormality. No acute or significant osseous findings. Review of the MIP images confirms the above findings. IMPRESSION: 1. No evidence of acute pulmonary embolism. 2. 1.9 x 1.9 cm irregular nodule in the right lower lobe is similar in size and demonstrates a now solid appearance without focal central lucency. These findings are concerning for malignancy. 3. Essentially unchanged 8 mm ground-glass nodular opacity in the right upper lobe. Recommend continued attention on follow-up imaging. 4. Similar bronchial wall thickening with cylindrical bronchiectasis. 5. Aortic Atherosclerosis (ICD10-I70.0) and Emphysema (ICD10-J43.9). Electronically Signed   By: Hart Robinsons M.D.   On: 09/13/2023 18:12   DG Chest 2 View  Result Date: 09/13/2023 CLINICAL DATA:  Shortness of breath.  History of lung cancer EXAM: CHEST - 2 VIEW COMPARISON:  June 25, 2023. FINDINGS: The heart size and mediastinal contours are within normal limits. Right basilar nodular density is again noted as described on prior CT scan. Left lung is  unremarkable. The visualized skeletal structures are unremarkable. IMPRESSION: Right basilar nodular density is again noted as described as potential malignancy  on prior CT scan. Electronically Signed   By: Lupita Raider M.D.   On: 09/13/2023 10:25    Today   Subjective    Rabon Hagee today has no new complaints  No fever  Or chills   No Nausea, Vomiting or Diarrhea        Patient has been seen and examined prior to discharge   Objective   Blood pressure 120/88, pulse 74, temperature 97.6 F (36.4 C), temperature source Oral, resp. rate 16, height 5' 5.98" (1.676 m), weight 70.2 kg, SpO2 96%.   Intake/Output Summary (Last 24 hours) at 09/16/2023 1240 Last data filed at 09/16/2023 1048 Gross per 24 hour  Intake 1200 ml  Output 1275 ml  Net -75 ml   Exam Gen:- Awake Alert, no acute distress, no conversational dyspnea HEENT:- [place].AT, No sclera icterus Nose- [place] 2L/min Neck-Supple Neck,No JVD,.  Lungs-improving air movement, no wheezing  CV- S1, S2 normal, regular Abd-  +ve B.Sounds, Abd Soft, No tenderness,    Extremity/Skin:- No  edema,   good pulses Psych-affect is appropriate, oriented x3 Neuro-no new focal deficits, no tremors    Data Review   CBC w Diff:  Lab Results  Component Value Date   WBC 5.2 09/14/2023   HGB 11.9 (L) 09/14/2023   HCT 38.2 (L) 09/14/2023   PLT 194 09/14/2023   LYMPHOPCT 18 09/13/2023   MONOPCT 9 09/13/2023   EOSPCT 1 09/13/2023   BASOPCT 1 09/13/2023    CMP:  Lab Results  Component Value Date   NA 136 09/16/2023   K 4.4 09/16/2023   CL 93 (L) 09/16/2023   CO2 34 (H) 09/16/2023   BUN 12 09/16/2023   CREATININE 0.68 09/16/2023   PROT 6.3 (L) 09/14/2023   ALBUMIN 3.3 (L) 09/14/2023   BILITOT 0.3 09/14/2023   ALKPHOS 64 09/14/2023   AST 14 (L) 09/14/2023   ALT 10 09/14/2023   Total Discharge time is about 33 minutes  Shon Hale M.D on 09/16/2023 at 12:40 PM  Go to [email or web address] -  for contact info  [facility] - Office  [phone]

## 2023-10-27 ENCOUNTER — Other Ambulatory Visit: Payer: Self-pay

## 2023-10-27 ENCOUNTER — Encounter (HOSPITAL_COMMUNITY): Payer: Self-pay

## 2023-10-27 ENCOUNTER — Inpatient Hospital Stay (HOSPITAL_COMMUNITY)
Admission: EM | Admit: 2023-10-27 | Discharge: 2023-11-01 | DRG: 189 | Disposition: A | Discharge status: Home / Self Care | Payer: 59 | Attending: Family Medicine | Admitting: Family Medicine

## 2023-10-27 ENCOUNTER — Emergency Department (HOSPITAL_COMMUNITY): Payer: 59

## 2023-10-27 DIAGNOSIS — Z5320 Procedure and treatment not carried out because of patient's decision for unspecified reasons: Secondary | ICD-10-CM | POA: Diagnosis present

## 2023-10-27 DIAGNOSIS — Z79899 Other long term (current) drug therapy: Secondary | ICD-10-CM

## 2023-10-27 DIAGNOSIS — F1410 Cocaine abuse, uncomplicated: Secondary | ICD-10-CM | POA: Diagnosis present

## 2023-10-27 DIAGNOSIS — D630 Anemia in neoplastic disease: Secondary | ICD-10-CM | POA: Diagnosis present

## 2023-10-27 DIAGNOSIS — Z1152 Encounter for screening for COVID-19: Secondary | ICD-10-CM

## 2023-10-27 DIAGNOSIS — J9622 Acute and chronic respiratory failure with hypercapnia: Secondary | ICD-10-CM | POA: Diagnosis not present

## 2023-10-27 DIAGNOSIS — J441 Chronic obstructive pulmonary disease with (acute) exacerbation: Secondary | ICD-10-CM | POA: Diagnosis not present

## 2023-10-27 DIAGNOSIS — Z886 Allergy status to analgesic agent status: Secondary | ICD-10-CM

## 2023-10-27 DIAGNOSIS — F17200 Nicotine dependence, unspecified, uncomplicated: Secondary | ICD-10-CM | POA: Diagnosis present

## 2023-10-27 DIAGNOSIS — J9621 Acute and chronic respiratory failure with hypoxia: Secondary | ICD-10-CM | POA: Diagnosis present

## 2023-10-27 DIAGNOSIS — I110 Hypertensive heart disease with heart failure: Secondary | ICD-10-CM | POA: Diagnosis present

## 2023-10-27 DIAGNOSIS — K219 Gastro-esophageal reflux disease without esophagitis: Secondary | ICD-10-CM | POA: Diagnosis present

## 2023-10-27 DIAGNOSIS — F17210 Nicotine dependence, cigarettes, uncomplicated: Secondary | ICD-10-CM | POA: Diagnosis present

## 2023-10-27 DIAGNOSIS — I10 Essential (primary) hypertension: Secondary | ICD-10-CM | POA: Diagnosis present

## 2023-10-27 DIAGNOSIS — E8729 Other acidosis: Secondary | ICD-10-CM | POA: Diagnosis present

## 2023-10-27 DIAGNOSIS — C3491 Malignant neoplasm of unspecified part of right bronchus or lung: Secondary | ICD-10-CM | POA: Diagnosis present

## 2023-10-27 DIAGNOSIS — D649 Anemia, unspecified: Secondary | ICD-10-CM

## 2023-10-27 DIAGNOSIS — Z716 Tobacco abuse counseling: Secondary | ICD-10-CM

## 2023-10-27 DIAGNOSIS — Z96649 Presence of unspecified artificial hip joint: Secondary | ICD-10-CM | POA: Diagnosis present

## 2023-10-27 DIAGNOSIS — Z9981 Dependence on supplemental oxygen: Secondary | ICD-10-CM

## 2023-10-27 DIAGNOSIS — R911 Solitary pulmonary nodule: Secondary | ICD-10-CM | POA: Diagnosis present

## 2023-10-27 DIAGNOSIS — I5032 Chronic diastolic (congestive) heart failure: Secondary | ICD-10-CM | POA: Diagnosis present

## 2023-10-27 DIAGNOSIS — Z23 Encounter for immunization: Secondary | ICD-10-CM

## 2023-10-27 LAB — CBC WITH DIFFERENTIAL/PLATELET
Abs Immature Granulocytes: 0.01 10*3/uL (ref 0.00–0.07)
Basophils Absolute: 0 10*3/uL (ref 0.0–0.1)
Basophils Relative: 1 %
Eosinophils Absolute: 0.3 10*3/uL (ref 0.0–0.5)
Eosinophils Relative: 4 %
HCT: 40.4 % (ref 39.0–52.0)
Hemoglobin: 12.5 g/dL — ABNORMAL LOW (ref 13.0–17.0)
Immature Granulocytes: 0 %
Lymphocytes Relative: 44 %
Lymphs Abs: 2.9 10*3/uL (ref 0.7–4.0)
MCH: 29.7 pg (ref 26.0–34.0)
MCHC: 30.9 g/dL (ref 30.0–36.0)
MCV: 96 fL (ref 80.0–100.0)
Monocytes Absolute: 0.7 10*3/uL (ref 0.1–1.0)
Monocytes Relative: 11 %
Neutro Abs: 2.6 10*3/uL (ref 1.7–7.7)
Neutrophils Relative %: 40 %
Platelets: 206 10*3/uL (ref 150–400)
RBC: 4.21 MIL/uL — ABNORMAL LOW (ref 4.22–5.81)
RDW: 12.1 % (ref 11.5–15.5)
WBC: 6.5 10*3/uL (ref 4.0–10.5)
nRBC: 0 % (ref 0.0–0.2)

## 2023-10-27 LAB — BLOOD GAS, ARTERIAL
Acid-Base Excess: 11.3 mmol/L — ABNORMAL HIGH (ref 0.0–2.0)
Bicarbonate: 40.6 mmol/L — ABNORMAL HIGH (ref 20.0–28.0)
Drawn by: 22223
O2 Saturation: 95.6 %
Patient temperature: 37
pCO2 arterial: 77 mm[Hg] (ref 32–48)
pH, Arterial: 7.33 — ABNORMAL LOW (ref 7.35–7.45)
pO2, Arterial: 72 mm[Hg] — ABNORMAL LOW (ref 83–108)

## 2023-10-27 LAB — BASIC METABOLIC PANEL
Anion gap: 7 (ref 5–15)
BUN: 15 mg/dL (ref 8–23)
CO2: 36 mmol/L — ABNORMAL HIGH (ref 22–32)
Calcium: 8.6 mg/dL — ABNORMAL LOW (ref 8.9–10.3)
Chloride: 98 mmol/L (ref 98–111)
Creatinine, Ser: 0.71 mg/dL (ref 0.61–1.24)
GFR, Estimated: >60 mL/min (ref >60)
Glucose, Bld: 153 mg/dL — ABNORMAL HIGH (ref 70–99)
Potassium: 4.1 mmol/L (ref 3.5–5.1)
Sodium: 141 mmol/L (ref 135–145)

## 2023-10-27 MED ORDER — ALBUTEROL SULFATE (2.5 MG/3ML) 0.083% IN NEBU
20.0000 mg/h | INHALATION_SOLUTION | Freq: Once | RESPIRATORY_TRACT | Status: DC
  Filled 2023-10-27: qty 6

## 2023-10-27 MED ORDER — IPRATROPIUM BROMIDE 0.02 % IN SOLN
RESPIRATORY_TRACT | Status: AC
  Administered 2023-10-27: 1 mg
  Filled 2023-10-27: qty 5

## 2023-10-27 MED ORDER — MAGNESIUM SULFATE 2 GM/50ML IV SOLN
2.0000 g | Freq: Once | INTRAVENOUS | Status: AC
  Administered 2023-10-27: 2 g via INTRAVENOUS
  Filled 2023-10-27: qty 50

## 2023-10-27 MED ORDER — ALBUTEROL SULFATE (2.5 MG/3ML) 0.083% IN NEBU
INHALATION_SOLUTION | RESPIRATORY_TRACT | Status: AC
  Administered 2023-10-27: 10 mg
  Filled 2023-10-27: qty 30

## 2023-10-27 MED ORDER — IPRATROPIUM BROMIDE 0.02 % IN SOLN
0.5000 mg | Freq: Once | RESPIRATORY_TRACT | Status: DC
  Filled 2023-10-27: qty 2.5

## 2023-10-27 NOTE — ED Notes (Signed)
Date and time results received: 10/27/23 2340   Test: CO2 Critical Value: 77  Name of Provider Notified: Preston Fleeting, MD

## 2023-10-27 NOTE — ED Provider Notes (Signed)
[address] EMERGENCY DEPARTMENT AT [facility] Provider Note   CSN: [id number] Arrival date & time: 10/27/23  2308     History {Add pertinent medical, surgical, social history, OB history to HPI:1} Chief complaint: Shortness of breath  Miguel Marsh is a 62 y.o. male.  The history is provided by the patient.  He has history of COPD, lung cancer and comes in because of difficulty breathing over the last 2 days which has been getting progressively worse.  He has had a cough productive of clear sputum.  He denies fever or chills but has had sweats.  He has been using his home nebulizer and inhaler without any relief.  He came in by ambulance where he received albuterol and ipratropium via nebulizer and methylprednisolone.  He does have history of requiring mechanical ventilation and states that he would want to proceed with that if necessary.   Home Medications Prior to Admission medications   Medication Sig Start Date End Date Taking? Authorizing Provider  acetaminophen (TYLENOL) 325 MG tablet Take 2 tablets (650 mg total) by mouth every 6 (six) hours as needed for mild pain (pain score 1-3) (or Fever >/= 101). 09/16/23   Shon Hale, MD  albuterol (PROVENTIL) (2.5 MG/3ML) 0.083% nebulizer solution Inhale 3 mLs (2.5 mg total) into the lungs every 6 (six) hours as needed for wheezing or shortness of breath. 09/16/23   Shon Hale, MD  albuterol (VENTOLIN HFA) 108 (90 Base) MCG/ACT inhaler Inhale 2 puffs into the lungs every 6 (six) hours as needed. 09/16/23   Shon Hale, MD  BREZTRI AEROSPHERE 160-9-4.8 MCG/ACT AERO Inhale 2 puffs into the lungs 2 (two) times daily. 09/16/23   Shon Hale, MD  clonazePAM (KLONOPIN) 1 MG tablet Take 1 mg by mouth 2 (two) times daily. 05/18/22   [provider]  furosemide (LASIX) 40 MG tablet Take 1 tablet (40 mg total) by mouth daily. Patient taking differently: Take 40 mg by mouth as needed for fluid or edema.  03/09/23   Vassie Loll, MD  omeprazole (PRILOSEC) 40 MG capsule Take 1 capsule (40 mg total) by mouth daily. 09/16/23   Shon Hale, MD  oxyCODONE (ROXICODONE) 15 MG immediate release tablet Take 15 mg by mouth every 4 (four) hours as needed.    [provider]  OXYGEN Inhale 3 L into the lungs continuous.     [provider]      Allergies    Ibuprofen    Review of Systems   Review of Systems  All other systems reviewed and are negative.   Physical Exam Updated Vital Signs Pulse (!) 107   Temp 98.4 F (36.9 C) (Axillary)   Resp (!) 24   Ht 5\' 6"  (1.676 m)   Wt 71 kg   SpO2 96%   BMI 25.26 kg/m  Physical Exam Vitals and nursing note reviewed.   62 year old male, in mild to moderate respiratory distress.  He is using accessory muscles of respiration, but can speak in complete sentences without stopping.  Vital signs are significant for elevated respiratory rate and heart rate. Oxygen saturation is 96%, which is normal. Head is normocephalic and atraumatic. PERRLA, EOMI. Oropharynx is clear. Neck is nontender and supple without adenopathy or JVD. Lungs are have diminished airflow with diffuse expiratory wheezes.  There are no rales or rhonchi. Chest is nontender. Heart has regular rate and rhythm without murmur. Abdomen is soft, flat, nontender. Extremities have no cyanosis or edema, full range of  motion is present. Skin is warm and dry without rash. Neurologic: Mental status is normal, moves all extremities equally.  ED Results / Procedures / Treatments   Labs (all labs ordered are listed, but only abnormal results are displayed) Labs Reviewed - No data to display  EKG None  Radiology No results found.  Procedures Procedures  Cardiac monitor shows sinus tachycardia, per my interpretation.  Medications Ordered in ED Medications  magnesium sulfate IVPB 2 g 50 mL (has no administration in time range)  ipratropium (ATROVENT) 0.02 %  nebulizer solution (1 mg  Given 10/27/23 2317)  albuterol (PROVENTIL) (2.5 MG/3ML) 0.083% nebulizer solution (10 mg  Given 10/27/23 2317)    ED Course/ Medical Decision Making/ A&P   {   Click here for ABCD2, HEART and other calculatorsREFRESH Note before signing :1}                              Medical Decision Making Amount and/or Complexity of Data Reviewed Labs: ordered. Radiology: ordered.  Risk Prescription drug management.   COPD exacerbation.  I have ordered a continuous nebulizer treatment with albuterol and ipratropium as well as intravenous magnesium.  I have ordered laboratory workup of CBC and basic metabolic panel as well as an arterial blood gas, chest x-ray, electrocardiogram.  On review of past records, he has history of COPD exacerbation and it appears that his baseline pCO2 is 80-90.  He has multiple admissions for COPD exacerbation, most recently 09/13/2023-09/16/2023.  {Document critical care time when appropriate:1} {Document review of labs and clinical decision tools ie heart score, Chads2Vasc2 etc:1}  {Document your independent review of radiology images, and any outside records:1} {Document your discussion with family members, caretakers, and with consultants:1} {Document social determinants of health affecting pt's care:1} {Document your decision making why or why not admission, treatments were needed:1} Final Clinical Impression(s) / ED Diagnoses Final diagnoses:  None    Rx / DC Orders ED Discharge Orders     None

## 2023-10-27 NOTE — ED Triage Notes (Signed)
Pt arrived form home via [facility] c/o SOB. Pt endorses COPD exacerbation. Pt delivered 2 Neb treatments to self PTA. EMS established IV access and administered 125mg  Solu-Medrol PTA. RT at bedside.

## 2023-10-28 DIAGNOSIS — J441 Chronic obstructive pulmonary disease with (acute) exacerbation: Secondary | ICD-10-CM

## 2023-10-28 DIAGNOSIS — I5032 Chronic diastolic (congestive) heart failure: Secondary | ICD-10-CM | POA: Diagnosis present

## 2023-10-28 DIAGNOSIS — Z5320 Procedure and treatment not carried out because of patient's decision for unspecified reasons: Secondary | ICD-10-CM | POA: Diagnosis present

## 2023-10-28 DIAGNOSIS — E8729 Other acidosis: Secondary | ICD-10-CM | POA: Diagnosis present

## 2023-10-28 DIAGNOSIS — Z716 Tobacco abuse counseling: Secondary | ICD-10-CM | POA: Diagnosis not present

## 2023-10-28 DIAGNOSIS — J9621 Acute and chronic respiratory failure with hypoxia: Secondary | ICD-10-CM | POA: Diagnosis present

## 2023-10-28 DIAGNOSIS — I110 Hypertensive heart disease with heart failure: Secondary | ICD-10-CM | POA: Diagnosis present

## 2023-10-28 DIAGNOSIS — Z79899 Other long term (current) drug therapy: Secondary | ICD-10-CM | POA: Diagnosis not present

## 2023-10-28 DIAGNOSIS — Z23 Encounter for immunization: Secondary | ICD-10-CM | POA: Diagnosis present

## 2023-10-28 DIAGNOSIS — Z1152 Encounter for screening for COVID-19: Secondary | ICD-10-CM | POA: Diagnosis not present

## 2023-10-28 DIAGNOSIS — C3491 Malignant neoplasm of unspecified part of right bronchus or lung: Secondary | ICD-10-CM | POA: Diagnosis present

## 2023-10-28 DIAGNOSIS — F17210 Nicotine dependence, cigarettes, uncomplicated: Secondary | ICD-10-CM | POA: Diagnosis present

## 2023-10-28 DIAGNOSIS — F1410 Cocaine abuse, uncomplicated: Secondary | ICD-10-CM | POA: Diagnosis present

## 2023-10-28 DIAGNOSIS — Z96649 Presence of unspecified artificial hip joint: Secondary | ICD-10-CM | POA: Diagnosis present

## 2023-10-28 DIAGNOSIS — Z9981 Dependence on supplemental oxygen: Secondary | ICD-10-CM | POA: Diagnosis not present

## 2023-10-28 DIAGNOSIS — D630 Anemia in neoplastic disease: Secondary | ICD-10-CM | POA: Diagnosis present

## 2023-10-28 DIAGNOSIS — J9622 Acute and chronic respiratory failure with hypercapnia: Secondary | ICD-10-CM | POA: Diagnosis present

## 2023-10-28 DIAGNOSIS — K219 Gastro-esophageal reflux disease without esophagitis: Secondary | ICD-10-CM | POA: Diagnosis present

## 2023-10-28 DIAGNOSIS — Z886 Allergy status to analgesic agent status: Secondary | ICD-10-CM | POA: Diagnosis not present

## 2023-10-28 LAB — BLOOD GAS, ARTERIAL
Acid-Base Excess: 8.3 mmol/L — ABNORMAL HIGH (ref 0.0–2.0)
Acid-Base Excess: 7.8 mmol/L — ABNORMAL HIGH (ref 0.0–2.0)
Bicarbonate: 36.7 mmol/L — ABNORMAL HIGH (ref 20.0–28.0)
Bicarbonate: 36.6 mmol/L — ABNORMAL HIGH (ref 20.0–28.0)
Drawn by: 22223
Drawn by: 22223
O2 Saturation: 93.1 %
O2 Saturation: 97.8 %
Patient temperature: 37
Patient temperature: 37
pCO2 arterial: 68 mm[Hg] (ref 32–48)
pCO2 arterial: 71 mm[Hg] (ref 32–48)
pH, Arterial: 7.34 — ABNORMAL LOW (ref 7.35–7.45)
pH, Arterial: 7.32 — ABNORMAL LOW (ref 7.35–7.45)
pO2, Arterial: 62 mm[Hg] — ABNORMAL LOW (ref 83–108)
pO2, Arterial: 83 mm[Hg] (ref 83–108)

## 2023-10-28 LAB — RAPID URINE DRUG SCREEN, HOSP PERFORMED
Amphetamines: NOT DETECTED
Barbiturates: NOT DETECTED
Benzodiazepines: NOT DETECTED
Cocaine: POSITIVE — AB
Opiates: NOT DETECTED
Tetrahydrocannabinol: NOT DETECTED

## 2023-10-28 LAB — RESP PANEL BY RT-PCR (RSV, FLU A&B, COVID)  RVPGX2
Influenza A by PCR: NEGATIVE
Influenza B by PCR: NEGATIVE
Resp Syncytial Virus by PCR: NEGATIVE
SARS Coronavirus 2 by RT PCR: NEGATIVE

## 2023-10-28 LAB — MRSA NEXT GEN BY PCR, NASAL: MRSA by PCR Next Gen: NOT DETECTED

## 2023-10-28 MED ORDER — IPRATROPIUM-ALBUTEROL 0.5-2.5 (3) MG/3ML IN SOLN
3.0000 mL | Freq: Four times a day (QID) | RESPIRATORY_TRACT | Status: DC
  Administered 2023-10-28 – 2023-10-30 (×9): 3 mL via RESPIRATORY_TRACT
  Filled 2023-10-28 (×9): qty 3

## 2023-10-28 MED ORDER — BISACODYL 10 MG RE SUPP: 10.0000 mg | Freq: Every day | RECTAL | Status: DC | PRN

## 2023-10-28 MED ORDER — PANTOPRAZOLE SODIUM 40 MG PO TBEC
40.0000 mg | DELAYED_RELEASE_TABLET | Freq: Every day | ORAL | Status: DC
  Administered 2023-10-28 – 2023-11-01 (×5): 40 mg via ORAL
  Filled 2023-10-28 (×5): qty 1

## 2023-10-28 MED ORDER — DM-GUAIFENESIN ER 30-600 MG PO TB12
1.0000 | ORAL_TABLET | Freq: Two times a day (BID) | ORAL | Status: DC
  Administered 2023-10-28 – 2023-11-01 (×9): 1 via ORAL
  Filled 2023-10-28 (×9): qty 1

## 2023-10-28 MED ORDER — OXYCODONE HCL 5 MG PO TABS
15.0000 mg | ORAL_TABLET | Freq: Once | ORAL | Status: AC
  Administered 2023-10-28: 15 mg via ORAL
  Filled 2023-10-28: qty 3

## 2023-10-28 MED ORDER — SODIUM CHLORIDE 0.9% FLUSH
3.0000 mL | Freq: Two times a day (BID) | INTRAVENOUS | Status: DC
  Administered 2023-10-28 – 2023-10-31 (×6): 3 mL via INTRAVENOUS

## 2023-10-28 MED ORDER — ALBUTEROL SULFATE (2.5 MG/3ML) 0.083% IN NEBU
INHALATION_SOLUTION | RESPIRATORY_TRACT | Status: AC
  Filled 2023-10-28: qty 12

## 2023-10-28 MED ORDER — ENOXAPARIN SODIUM 40 MG/0.4ML IJ SOSY
40.0000 mg | PREFILLED_SYRINGE | INTRAMUSCULAR | Status: DC
Start: 2023-10-28 — End: 2023-11-01
  Administered 2023-10-28 – 2023-10-31 (×4): 40 mg via SUBCUTANEOUS
  Filled 2023-10-28 (×4): qty 0.4

## 2023-10-28 MED ORDER — NICOTINE 21 MG/24HR TD PT24
21.0000 mg | MEDICATED_PATCH | Freq: Every day | TRANSDERMAL | Status: DC
Start: 2023-10-28 — End: 2023-11-01
  Administered 2023-10-28 – 2023-11-01 (×5): 21 mg via TRANSDERMAL
  Filled 2023-10-28 (×5): qty 1

## 2023-10-28 MED ORDER — SODIUM CHLORIDE 0.9% FLUSH: 3.0000 mL | INTRAVENOUS | Status: DC | PRN

## 2023-10-28 MED ORDER — IPRATROPIUM-ALBUTEROL 0.5-2.5 (3) MG/3ML IN SOLN
3.0000 mL | Freq: Once | RESPIRATORY_TRACT | Status: AC
Start: 2023-10-28 — End: 2023-10-28
  Administered 2023-10-28: 3 mL via RESPIRATORY_TRACT
  Filled 2023-10-28: qty 3

## 2023-10-28 MED ORDER — DOXYCYCLINE HYCLATE 100 MG PO TABS
100.0000 mg | ORAL_TABLET | Freq: Two times a day (BID) | ORAL | Status: DC
  Administered 2023-10-28 – 2023-11-01 (×9): 100 mg via ORAL
  Filled 2023-10-28 (×9): qty 1

## 2023-10-28 MED ORDER — BUDESON-GLYCOPYRROL-FORMOTEROL 160-9-4.8 MCG/ACT IN AERO
2.0000 | INHALATION_SPRAY | Freq: Two times a day (BID) | RESPIRATORY_TRACT | Status: DC
Start: 2023-10-28 — End: 2023-10-28

## 2023-10-28 MED ORDER — FLUTICASONE FUROATE-VILANTEROL 100-25 MCG/ACT IN AEPB
1.0000 | INHALATION_SPRAY | Freq: Every day | RESPIRATORY_TRACT | Status: DC
  Administered 2023-10-28 – 2023-11-01 (×5): 1 via RESPIRATORY_TRACT
  Filled 2023-10-28: qty 28

## 2023-10-28 MED ORDER — ONDANSETRON HCL 4 MG PO TABS: 4.0000 mg | ORAL_TABLET | Freq: Four times a day (QID) | ORAL | Status: DC | PRN

## 2023-10-28 MED ORDER — ONDANSETRON HCL 4 MG/2ML IJ SOLN: 4.0000 mg | Freq: Four times a day (QID) | INTRAMUSCULAR | Status: DC | PRN

## 2023-10-28 MED ORDER — UMECLIDINIUM BROMIDE 62.5 MCG/ACT IN AEPB
1.0000 | INHALATION_SPRAY | Freq: Every day | RESPIRATORY_TRACT | Status: DC
  Administered 2023-10-28 – 2023-11-01 (×5): 1 via RESPIRATORY_TRACT
  Filled 2023-10-28: qty 7

## 2023-10-28 MED ORDER — OXYCODONE HCL 5 MG PO TABS
10.0000 mg | ORAL_TABLET | Freq: Four times a day (QID) | ORAL | Status: DC | PRN
  Administered 2023-10-28 – 2023-11-01 (×9): 10 mg via ORAL
  Filled 2023-10-28 (×9): qty 2

## 2023-10-28 MED ORDER — METHYLPREDNISOLONE SODIUM SUCC 40 MG IJ SOLR
40.0000 mg | Freq: Two times a day (BID) | INTRAMUSCULAR | Status: DC
  Administered 2023-10-28 – 2023-11-01 (×8): 40 mg via INTRAVENOUS
  Filled 2023-10-28 (×8): qty 1

## 2023-10-28 MED ORDER — ACETAMINOPHEN 650 MG RE SUPP: 650.0000 mg | Freq: Four times a day (QID) | RECTAL | Status: DC | PRN

## 2023-10-28 MED ORDER — ALBUTEROL SULFATE (2.5 MG/3ML) 0.083% IN NEBU: 10.0000 mg | INHALATION_SOLUTION | RESPIRATORY_TRACT | Status: AC

## 2023-10-28 MED ORDER — METHYLPREDNISOLONE SODIUM SUCC 125 MG IJ SOLR
125.0000 mg | Freq: Once | INTRAMUSCULAR | Status: AC
  Administered 2023-10-28: 125 mg via INTRAVENOUS
  Filled 2023-10-28: qty 2

## 2023-10-28 MED ORDER — CLONAZEPAM 0.5 MG PO TABS
0.5000 mg | ORAL_TABLET | Freq: Two times a day (BID) | ORAL | Status: DC | PRN
  Administered 2023-10-28 – 2023-11-01 (×4): 0.5 mg via ORAL
  Filled 2023-10-28 (×4): qty 1

## 2023-10-28 MED ORDER — FUROSEMIDE 40 MG PO TABS
40.0000 mg | ORAL_TABLET | Freq: Every day | ORAL | Status: DC
  Administered 2023-10-29 – 2023-11-01 (×4): 40 mg via ORAL
  Filled 2023-10-28 (×5): qty 1

## 2023-10-28 MED ORDER — CHLORHEXIDINE GLUCONATE CLOTH 2 % EX PADS
6.0000 | MEDICATED_PAD | Freq: Every day | CUTANEOUS | Status: DC
  Administered 2023-10-28 – 2023-10-29 (×2): 6 via TOPICAL

## 2023-10-28 MED ORDER — ACETAMINOPHEN 325 MG PO TABS: 650.0000 mg | ORAL_TABLET | Freq: Four times a day (QID) | ORAL | Status: DC | PRN

## 2023-10-28 MED ORDER — SODIUM CHLORIDE 0.9% FLUSH
3.0000 mL | Freq: Two times a day (BID) | INTRAVENOUS | Status: DC
  Administered 2023-10-28 – 2023-11-01 (×7): 3 mL via INTRAVENOUS

## 2023-10-28 MED ORDER — POLYETHYLENE GLYCOL 3350 17 G PO PACK: 17.0000 g | PACK | Freq: Every day | ORAL | Status: DC | PRN

## 2023-10-28 MED ORDER — SODIUM CHLORIDE 0.9 % IV SOLN: INTRAVENOUS | Status: AC | PRN

## 2023-10-28 MED ORDER — ALBUTEROL SULFATE (2.5 MG/3ML) 0.083% IN NEBU
2.5000 mg | INHALATION_SOLUTION | RESPIRATORY_TRACT | Status: DC | PRN
  Administered 2023-10-28 – 2023-10-30 (×2): 2.5 mg via RESPIRATORY_TRACT
  Filled 2023-10-28 (×2): qty 3

## 2023-10-28 NOTE — H&P (Signed)
Patient Demographics:    Miguel Marsh, is a 62 y.o. male  MRN: [id number]   DOB - [birth year]  Admit Date - 10/27/2023  Outpatient Primary MD for the patient is [name], NP   Assessment & Plan:   Assessment and Plan: 1)Acute on chronic hypoxic and hypercapnic respiratory failure--- due to COPD exacerbation -Patient with increased shortness of breath required multiple nebulizer treatments and IV steroids from EMS PTA -ABG in the ED showed uncompensated respiratory acidosis, after interventions in the ED including BiPAP and steroids and bronchodilators patient's respiratory status improved he was taken off BiPAP however he decompensated again had to be placed back on BiPAP in the ED -- At the time of my evaluation patient is requesting to be taken off BiPAP again -Chest x-ray with stable findings including stable right basilar nodular density concerning for malignancy which is not new--- no new acute findings -COVID, influenza and RSV negative --- EKG sinus tach without acute findings -WBC of 6.5 -Continue IV Solu-Medrol, doxycycline, bronchodilators mucolytic's --Okay to use BiPAP as needed alternating with nasal cannula -  2)Acute COPD exacerbation---with ongoing tobacco abuse --Management as above #1   3)Recurrent Lung Cancer--- initially diagnosed with lung cancer in 2016 -RLL cavitary nodule, history of NSCLC: 9mm RUL GGO, 1.9x1.8cm irregular potentially cavitary RLL nodule on CTA 06/25/2023 --patient follows with [facility] -Lung nodule-1.9 x 1.9 cm irregular nodule in the right lower lobe is similar in size and demonstration and now solid appearance without focal central lucency these are concerning for malignancy.  -Outpatient follow-up with his oncologist at [facility] advised    4)HFpEF-Chronic Diastolic Dysfunction CHF --Echo from 03/06/2023 with EF of 60 to 65% -Prior echo from 2019 was consistent with EF of 65 to 60% and grade 1 diastolic dysfunction -Stable at this time, no acute exacerbation, -c/n PTA Lasix 40 mg daily   5)GERD--- continue protonix especially while on steroids   6)H/o Cocaine Abuse--patient is not ready to quit -Urine tox requested   7)Tobacco Abuse--- smoking cessation advised patient not ready to quit -Give nicotine patch  Status is: Inpatient  Dispo: The patient is from: Home              Anticipated d/c is to: Home              Anticipated d/c date is: 2 days              Patient currently is not medically stable to d/c. Barriers: Not Clinically Stable-   With History of - Reviewed by me  Past Medical History:  Diagnosis Date   Arthritis    Bronchitis    Cancer (HCC)    metastatic NSCLC (followed by [facility])   COPD (chronic obstructive pulmonary disease) (HCC)    HTN (hypertension)       Past Surgical History:  Procedure Laterality Date   LUNG BIOPSY     TOTAL HIP ARTHROPLASTY  TUMOR REMOVAL Right 12/04/2017    Chief Complaint  Patient presents with   Shortness of Breath      HPI:    Miguel Marsh  is a 62 y.o. male  with medical history significant of GERD, COPD, chronic hypoxic respiratory failure on 3 LPM of oxygen at baseline (history of prior intubations for respiratory failure), HFpEF, history of metastatic NSCLC (follows with [facility]), HTN , history of polysubstance abuse and ongoing tobacco abuse returns to the ED on 10/27/2023  by EMS with increased shortness of breath required multiple nebulizer treatments and IV steroids from EMS PTA -ABG in the ED showed uncompensated respiratory acidosis, after interventions in the ED including BiPAP and steroids and bronchodilators patient's respiratory status improved he was taken off BiPAP however he decompensated again had to be placed back  on BiPAP in the ED -- At time of my evaluation patient is requesting to be taken off BiPAP again -Chest x-ray with stable findings including stable right basilar nodular density concerning for malignancy which is not new--- no new acute findings -COVID, influenza and RSV negative --- EKG sinus tach without acute findings -CBC with a WBC of 6.5 hemoglobin of 12.5, platelets 206 -Creatinine 0.71, potassium 4.1 sodium 141 , bicarb 36    Review of systems:    In addition to the HPI above,   A full Review of  Systems was done, all other systems reviewed are negative except as noted above in HPI , .   Social History:  Reviewed by me   Social History   Tobacco Use   Smoking status: Former    Current packs/day: 0.10    Types: Cigarettes   Smokeless tobacco: Never  Substance Use Topics   Alcohol use: Yes    Comment: occ     Family History :  Reviewed by me   History reviewed. No pertinent family history.    Home Medications:   Prior to Admission medications   Medication Sig Start Date End Date Taking? Authorizing Provider  acetaminophen (TYLENOL) 325 MG tablet Take 2 tablets (650 mg total) by mouth every 6 (six) hours as needed for mild pain (pain score 1-3) (or Fever >/= 101). 09/16/23  Yes Saiya Crist, MD  albuterol (PROVENTIL) (2.5 MG/3ML) 0.083% nebulizer solution Inhale 3 mLs (2.5 mg total) into the lungs every 6 (six) hours as needed for wheezing or shortness of breath. 09/16/23  Yes Aland Chestnutt, MD  albuterol (VENTOLIN HFA) 108 (90 Base) MCG/ACT inhaler Inhale 2 puffs into the lungs every 6 (six) hours as needed. 09/16/23  Yes Miski Feldpausch, MD  BREZTRI AEROSPHERE 160-9-4.8 MCG/ACT AERO Inhale 2 puffs into the lungs 2 (two) times daily. 09/16/23  Yes Rilyn Scroggs, MD  clonazePAM (KLONOPIN) 1 MG tablet Take 1 mg by mouth 2 (two) times daily. 05/18/22  Yes [provider]  omeprazole (PRILOSEC) 40 MG capsule Take 1 capsule (40 mg total) by mouth  daily. 09/16/23  Yes Vonceil Upshur, MD  oxyCODONE (ROXICODONE) 15 MG immediate release tablet Take 15 mg by mouth every 4 (four) hours as needed for pain.   Yes [provider]  OXYGEN Inhale 3 L into the lungs continuous.    Yes [provider]  furosemide (LASIX) 40 MG tablet Take 1 tablet (40 mg total) by mouth daily. Patient not taking: Reported on 10/28/2023 03/09/23   Vassie Loll, MD     Allergies:     Allergies  Allergen Reactions   Ibuprofen Shortness  Of Breath     Physical Exam:   Vitals  Blood pressure (!) 125/47, pulse 76, temperature 98.1 F (36.7 C), temperature source Oral, resp. rate 13, height 5\' 6"  (1.676 m), weight 72.8 kg, SpO2 96%.  Physical Examination: General appearance - alert, acute respiratory distress, tachypnea and increased work of breathing noted requiring BiPAP Mental status - alert, oriented to person, place, and time,  Nose- Hobbs 5L/min--- alternating with BiPAP Eyes - sclera anicteric Neck - supple, no JVD elevation , Chest -diminished breath sounds with scattered wheezes bilaterally  heart - S1 and S2 normal, regular, tachy Abdomen - soft, nontender, nondistended, +BS Neurological - screening mental status exam normal, neck supple without rigidity, cranial nerves II through XII intact, DTR's normal and symmetric Extremities - no pedal edema noted, intact peripheral pulses  Skin - warm, dry   Data Review:    CBC Recent Labs  Lab 10/27/23 2335  WBC 6.5  HGB 12.5*  HCT 40.4  PLT 206  MCV 96.0  MCH 29.7  MCHC 30.9  RDW 12.1  LYMPHSABS 2.9  MONOABS 0.7  EOSABS 0.3  BASOSABS 0.0  ------------------------------------------------------------------------------------------------------------------  Chemistries  Recent Labs  Lab 10/27/23 2335  NA 141  K 4.1  CL 98  CO2 36*  GLUCOSE 153*  BUN 15  CREATININE 0.71  CALCIUM 8.6*    ------------------------------------------------------------------------------------------------------------------ estimated creatinine clearance is 86.4 mL/min (by C-G formula based on SCr of 0.71 mg/dL). ------------------------------------------------------------------------------------------------------------------ ------------------------------------------------------------------------------------------------------------------    Component Value Date/Time   BNP 27.0 09/13/2023 0914  --------------------------------------------------------------------------------------------------------------  Urinalysis    Component Value Date/Time   COLORURINE YELLOW 03/06/2023 1130   APPEARANCEUR HAZY (A) 03/06/2023 1130   LABSPEC 1.023 03/06/2023 1130   PHURINE 5.0 03/06/2023 1130   GLUCOSEU 50 (A) 03/06/2023 1130   HGBUR NEGATIVE 03/06/2023 1130   BILIRUBINUR NEGATIVE 03/06/2023 1130   KETONESUR NEGATIVE 03/06/2023 1130   PROTEINUR 100 (A) 03/06/2023 1130   UROBILINOGEN 0.2 10/21/2013 0200   NITRITE NEGATIVE 03/06/2023 1130   LEUKOCYTESUR NEGATIVE 03/06/2023 1130   ----------------------------------------------------------------------------------------------------------------   Imaging Results:    DG Chest Port 1 View Result Date: 10/27/2023 CLINICAL DATA:  Shortness of breath EXAM: PORTABLE CHEST 1 VIEW COMPARISON:  08/13/2019 FINDINGS: Cardiac shadow is stable. The lungs are well aerated bilaterally. Stable nodular density in the right base is again seen similar to that seen on prior CT examination. No bony abnormality is noted. IMPRESSION: Stable right basilar nodular density when compared with the prior CT again concerning for malignancy. No new focal abnormality is noted. Electronically Signed   By: Alcide Clever M.D.   On: 10/27/2023 23:57   Radiological Exams on Admission: DG Chest Port 1 View Result Date: 10/27/2023 CLINICAL DATA:  Shortness of breath EXAM: PORTABLE CHEST 1  VIEW COMPARISON:  08/13/2019 FINDINGS: Cardiac shadow is stable. The lungs are well aerated bilaterally. Stable nodular density in the right base is again seen similar to that seen on prior CT examination. No bony abnormality is noted. IMPRESSION: Stable right basilar nodular density when compared with the prior CT again concerning for malignancy. No new focal abnormality is noted. Electronically Signed   By: Alcide Clever M.D.   On: 10/27/2023 23:57   DVT Prophylaxis -SCD /Lovenox AM Labs Ordered, also please review Full Orders  Family Communication: Admission, patients condition and plan of care including tests being ordered have been discussed with the patient  who indicate understanding and agree with the plan   Condition -stable  Shon Hale M.D on  10/28/2023 at 5:20 PM Go to [email or web address] -  for contact info  [facility] - Office  [phone]

## 2023-10-28 NOTE — ED Notes (Signed)
Pt provided drink and snack at this time.

## 2023-10-28 NOTE — Progress Notes (Signed)
At approximately [phone] patient being prepared to be transported to ICU on BiPAP and he demanded to be taken off the BiPAP. BiPAP was removed and taken to ICU# 10, RN and NT to transport patient.

## 2023-10-28 NOTE — ED Provider Notes (Signed)
At change of shift care signed out from Dr. Preston Fleeting to follow-up consultation with hospitalist.  They were paged for admission for COPD and respiratory failure.  Patient is requiring more nebulizer treatments and a continuous neb.  I discussed the case with Dr. Mariea Clonts at 8:00 AM, he has been kind enough to admit this very sick patient to the hospital   Eber Hong, MD 10/28/23 [phone]

## 2023-10-29 DIAGNOSIS — J441 Chronic obstructive pulmonary disease with (acute) exacerbation: Secondary | ICD-10-CM | POA: Diagnosis not present

## 2023-10-29 LAB — BASIC METABOLIC PANEL
Anion gap: 7 (ref 5–15)
BUN: 11 mg/dL (ref 8–23)
CO2: 33 mmol/L — ABNORMAL HIGH (ref 22–32)
Calcium: 9 mg/dL (ref 8.9–10.3)
Chloride: 99 mmol/L (ref 98–111)
Creatinine, Ser: 0.58 mg/dL — ABNORMAL LOW (ref 0.61–1.24)
GFR, Estimated: >60 mL/min (ref >60)
Glucose, Bld: 136 mg/dL — ABNORMAL HIGH (ref 70–99)
Potassium: 4.6 mmol/L (ref 3.5–5.1)
Sodium: 139 mmol/L (ref 135–145)

## 2023-10-29 MED ORDER — INFLUENZA VAC A&B SURF ANT ADJ 0.5 ML IM SUSY
0.5000 mL | PREFILLED_SYRINGE | INTRAMUSCULAR | Status: AC
  Administered 2023-10-30: 0.5 mL via INTRAMUSCULAR
  Filled 2023-10-29: qty 0.5

## 2023-10-29 MED ORDER — PNEUMOCOCCAL 20-VAL CONJ VACC 0.5 ML IM SUSY
0.5000 mL | PREFILLED_SYRINGE | INTRAMUSCULAR | Status: AC
  Administered 2023-10-30: 0.5 mL via INTRAMUSCULAR
  Filled 2023-10-29: qty 0.5

## 2023-10-29 MED ORDER — ORAL CARE MOUTH RINSE: 15.0000 mL | OROMUCOSAL | Status: DC | PRN

## 2023-10-29 NOTE — TOC Transition Note (Signed)
Transition of Care [facility]) - Discharge Note   Patient Details  Name: Miguel Marsh MRN: [id number] Date of Birth: Jul 28, 1961  Transition of [facility] CM/SW Contact:  Beather Arbour Phone Number: 10/29/2023, 4:32 PM   Clinical Narrative:    Patient is risk for readmission. Patient was admitted for COPD with acute exacerbation. Patient was assessed . Patient has oxygen at home and is on 3L. Patient family supports when they can. Patient uses [facility] services for transportation. Patient stated that he has had HH before when he had his knee replaced. Patient stated that if [facility] or SNF is recommended he will consider. TOC has not be consulted, but will continue to follow.    Final next level of care: Home/Self Care Barriers to Discharge: Continued Medical Work up   Patient Goals and CMS Choice Patient states their goals for this hospitalization and ongoing recovery are:: return back home CMS Medicare.gov Compare Post Acute Care list provided to:: Patient Choice offered to / list presented to : Patient      Discharge Placement  Home with self care         Discharge Plan and Services Additional resources added to the After Visit Summary for   In-house Referral: NA   Post Acute Care Choice: Durable Medical Equipment             Has oxygen at home   Social Drivers of Health (SDOH) Interventions SDOH Screenings   Food Insecurity: No Food Insecurity (10/28/2023)  Housing: Low Risk  (10/28/2023)  Transportation Needs: No Transportation Needs (10/28/2023)  Utilities: Not At Risk (10/28/2023)  Tobacco Use: Medium Risk (10/27/2023)     Readmission Risk Interventions    10/29/2023    4:27 PM 06/25/2023    9:30 AM 03/07/2023    9:19 AM  Readmission Risk Prevention Plan  Transportation Screening Complete Complete Complete  HRI or Home Care Consult  Complete Complete  Social Work Consult for Recovery Care Planning/Counseling  Complete Complete  Palliative Care Screening   Not Applicable Not Applicable  Medication Review Oceanographer) Complete Complete Complete  HRI or Home Care Consult Complete    SW Recovery Care/Counseling Consult Complete    Palliative Care Screening Not Applicable    Skilled Nursing Facility Patient Refused

## 2023-10-29 NOTE — Progress Notes (Signed)
PROGRESS NOTE  Miguel Marsh, is a 62 y.o. male, DOB - [birth year], WRU:[id number]  Admit date - 10/27/2023   Admitting Physician Mosella Kasa Mariea Clonts, MD  Outpatient Primary MD for the patient is [name], NP  LOS - 1  Chief Complaint  Patient presents with   Shortness of Breath      Brief Narrative:  62 y.o. male  with medical history significant of GERD, COPD, chronic hypoxic respiratory failure on 3 LPM of oxygen at baseline (history of prior intubations for respiratory failure), HFpEF, history of metastatic NSCLC (follows with [facility]), HTN , history of polysubstance abuse and ongoing tobacco abuse admitted on 10/28/2023 with acute on chronic hypoxic and hypercapnic respiratory failure with uncompensated respiratory acidosis requiring BiPAP discharge on admission    -Assessment and Plan: 1)Acute on chronic hypoxic and hypercapnic respiratory failure--- due to COPD exacerbation -Patient with increased shortness of breath required multiple nebulizer treatments and IV steroids from EMS PTA -ABG in the ED showed uncompensated respiratory acidosis, after interventions in the ED including BiPAP and steroids and bronchodilators patient's respiratory status improved he was taken off BiPAP however he decompensated again had to be placed back on BiPAP in the ED -COVID, influenza and RSV negative --- EKG sinus tach without acute findings 10/29/23 -cough, wheezing and dyspnea is Not worse -Patient refused CPAP/BiPAP overnight -Continue IV Solu-Medrol, doxycycline, bronchodilators mucolytic's -Encourage BiPAP use as needed and nightly -Oxygen requirement improving currently down to 5 L via nasal cannula -   2)Acute COPD exacerbation---with ongoing tobacco abuse --Management as above #1   3)Recurrent Lung Cancer--- initially diagnosed with lung cancer in 2016 -RLL cavitary nodule, history of NSCLC: 9mm RUL GGO, 1.9x1.8cm irregular potentially cavitary RLL  nodule on CTA 06/25/2023 --patient follows with [facility] -Lung nodule-1.9 x 1.9 cm irregular nodule in the right lower lobe is similar in size and demonstration and now solid appearance without focal central lucency these are concerning for malignancy.  -Outpatient follow-up with his oncologist at [facility] advised   4)HFpEF-Chronic Diastolic Dysfunction CHF --Echo from 03/06/2023 with EF of 60 to 65% -Prior echo from 2019 was consistent with EF of 65 to 60% and grade 1 diastolic dysfunction 10/29/23 -Stable at this time, no acute exacerbation (appears euvolemic) -c/n PTA Lasix 40 mg daily   5)GERD--- continue protonix especially while on steroids   6)H/o Cocaine Abuse--patient is not ready to quit -Urine tox positive for cocaine this admission   7)Tobacco Abuse--- smoking cessation advised patient not ready to quit -c/n Nicotine patch  Status is: Inpatient   Disposition: The patient is from: Home              Anticipated d/c is to: Home              Anticipated d/c date is: 1 day              Patient currently is not medically stable to d/c. Barriers: Not Clinically Stable-   Code Status :  -  Code Status: Full Code   Family Communication:    NA (patient is alert, awake and coherent)   DVT Prophylaxis  :   - SCDs  enoxaparin (LOVENOX) injection 40 mg Start: 10/28/23 2200 SCDs Start: 10/28/23 0806 Place TED hose Start: 10/28/23 0806   Lab Results  Component Value Date   PLT 206 10/27/2023    Inpatient Medications  Scheduled Meds:  albuterol  20 mg/hr Nebulization Once   Chlorhexidine Gluconate Cloth  6 each Topical Q0600   dextromethorphan-guaiFENesin  1 tablet Oral BID   doxycycline  100 mg Oral Q12H   enoxaparin (LOVENOX) injection  40 mg Subcutaneous Q24H   fluticasone furoate-vilanterol  1 puff Inhalation Daily   And   umeclidinium bromide  1 puff Inhalation Daily   furosemide  40 mg Oral Daily   ipratropium  0.5 mg Nebulization Once    ipratropium-albuterol  3 mL Nebulization Q6H   methylPREDNISolone (SOLU-MEDROL) injection  40 mg Intravenous Q12H   nicotine  21 mg Transdermal Daily   pantoprazole  40 mg Oral Daily   sodium chloride flush  3 mL Intravenous Q12H   sodium chloride flush  3 mL Intravenous Q12H   Continuous Infusions: PRN Meds:.acetaminophen **OR** acetaminophen, albuterol, bisacodyl, clonazePAM, ondansetron **OR** ondansetron (ZOFRAN) IV, oxyCODONE, polyethylene glycol, sodium chloride flush   Anti-infectives (From admission, onward)    Start     Dose/Rate Route Frequency Ordered Stop   10/28/23 1000  doxycycline (VIBRA-TABS) tablet 100 mg        100 mg Oral Every 12 hours 10/28/23 0800        Subjective: Miguel Marsh today has no fevers, no emesis,  No chest pain,   -cough, wheezing and dyspnea is Not worse -Patient refused CPAP/BiPAP overnight  Objective: Vitals:   10/29/23 0800 10/29/23 0824 10/29/23 1255 10/29/23 1302  BP: (!) 148/72  (!) 150/77   Pulse: 66  76   Resp: 18     Temp:   97.9 F (36.6 C)   TempSrc:   Oral   SpO2: 99% 99% 99% 99%  Weight:      Height:        Intake/Output Summary (Last 24 hours) at 10/29/2023 1344 Last data filed at 10/29/2023 1200 Gross per 24 hour  Intake 975 ml  Output 2800 ml  Net -1825 ml   Filed Weights   10/27/23 2325 10/28/23 0838  Weight: 71 kg 72.8 kg    Physical Exam Gen:- Awake Alert, no conversational dyspnea, he has dyspnea with minimal exertion HEENT:- [place].AT, No sclera icterus Nose- [place] 4L/min Neck-Supple Neck,No JVD,.  Lungs-improving air movement, overall air movement is not great though, few scattered wheezes  CV- S1, S2 normal, regular  Abd-  +ve B.Sounds, Abd Soft, No tenderness,    Extremity/Skin:- No  edema, pedal pulses present  Psych-affect is appropriate, oriented x3 Neuro--generalized weakness, no new focal deficits, no tremors  Data Reviewed: I have personally reviewed following labs and imaging  studies  CBC: Recent Labs  Lab 10/27/23 2335  WBC 6.5  NEUTROABS 2.6  HGB 12.5*  HCT 40.4  MCV 96.0  PLT 206   Basic Metabolic Panel: Recent Labs  Lab 10/27/23 2335 10/29/23 0414  NA 141 139  K 4.1 4.6  CL 98 99  CO2 36* 33*  GLUCOSE 153* 136*  BUN 15 11  CREATININE 0.71 0.58*  CALCIUM 8.6* 9.0   GFR: Estimated Creatinine Clearance: 86.4 mL/min (A) (by C-G formula based on SCr of 0.58 mg/dL (L)).  Recent Results (from the past 240 hours)  Resp panel by RT-PCR (RSV, Flu A&B, Covid) Anterior Nasal Swab     Status: None   Collection Time: 10/27/23 11:34 PM   Specimen: Anterior Nasal Swab  Result Value Ref Range Status   SARS Coronavirus 2 by RT PCR NEGATIVE NEGATIVE Final    Comment: (NOTE) SARS-CoV-2 target nucleic acids are NOT DETECTED.  The SARS-CoV-2 RNA is generally detectable in upper respiratory specimens during  the acute phase of infection. The lowest concentration of SARS-CoV-2 viral copies this assay can detect is 138 copies/mL. A negative result does not preclude SARS-Cov-2 infection and should not be used as the sole basis for treatment or other patient management decisions. A negative result may occur with  improper specimen collection/handling, submission of specimen other than nasopharyngeal swab, presence of viral mutation(s) within the areas targeted by this assay, and inadequate number of viral copies(<138 copies/mL). A negative result must be combined with clinical observations, patient history, and epidemiological information. The expected result is Negative.  Fact Sheet for Patients:  BloggerCourse.com  Fact Sheet for Healthcare Providers:  SeriousBroker.it  This test is no t yet approved or cleared by the [place] FDA and  has been authorized for detection and/or diagnosis of SARS-CoV-2 by FDA under an Emergency Use Authorization (EUA). This EUA will remain  in effect (meaning this  test can be used) for the duration of the COVID-19 declaration under Section 564(b)(1) of the Act, 21 U.S.C.section 360bbb-3(b)(1), unless the authorization is terminated  or revoked sooner.       Influenza A by PCR NEGATIVE NEGATIVE Final   Influenza B by PCR NEGATIVE NEGATIVE Final    Comment: (NOTE) The Xpert Xpress SARS-CoV-2/FLU/RSV plus assay is intended as an aid in the diagnosis of influenza from Nasopharyngeal swab specimens and should not be used as a sole basis for treatment. Nasal washings and aspirates are unacceptable for Xpert Xpress SARS-CoV-2/FLU/RSV testing.  Fact Sheet for Patients: BloggerCourse.com  Fact Sheet for Healthcare Providers: SeriousBroker.it  This test is not yet approved or cleared by the [place] FDA and has been authorized for detection and/or diagnosis of SARS-CoV-2 by FDA under an Emergency Use Authorization (EUA). This EUA will remain in effect (meaning this test can be used) for the duration of the COVID-19 declaration under Section 564(b)(1) of the Act, 21 U.S.C. section 360bbb-3(b)(1), unless the authorization is terminated or revoked.     Resp Syncytial Virus by PCR NEGATIVE NEGATIVE Final    Comment: (NOTE) Fact Sheet for Patients: BloggerCourse.com  Fact Sheet for Healthcare Providers: SeriousBroker.it  This test is not yet approved or cleared by the [place] FDA and has been authorized for detection and/or diagnosis of SARS-CoV-2 by FDA under an Emergency Use Authorization (EUA). This EUA will remain in effect (meaning this test can be used) for the duration of the COVID-19 declaration under Section 564(b)(1) of the Act, 21 U.S.C. section 360bbb-3(b)(1), unless the authorization is terminated or revoked.  Performed at [facility], [address] [place], [place] [address]   MRSA Next Gen by PCR, Nasal     Status:  None   Collection Time: 10/28/23  8:23 AM   Specimen: Nasal Mucosa; Nasal Swab  Result Value Ref Range Status   MRSA by PCR Next Gen NOT DETECTED NOT DETECTED Final    Comment: (NOTE) The GeneXpert MRSA Assay (FDA approved for NASAL specimens only), is one component of a comprehensive MRSA colonization surveillance program. It is not intended to diagnose MRSA infection nor to guide or monitor treatment for MRSA infections. Test performance is not FDA approved in patients less than 47 years old. Performed at [facility], [address], [place], [place] [address]     Radiology Studies: [facility] [place] 1 View Result Date: 10/27/2023 CLINICAL DATA:  Shortness of breath EXAM: PORTABLE CHEST 1 VIEW COMPARISON:  08/13/2019 FINDINGS: Cardiac shadow is stable. The lungs are well aerated bilaterally. Stable nodular density  in the right base is again seen similar to that seen on prior CT examination. No bony abnormality is noted. IMPRESSION: Stable right basilar nodular density when compared with the prior CT again concerning for malignancy. No new focal abnormality is noted. Electronically Signed   By: Alcide Clever M.D.   On: 10/27/2023 23:57   Scheduled Meds:  albuterol  20 mg/hr Nebulization Once   Chlorhexidine Gluconate Cloth  6 each Topical Q0600   dextromethorphan-guaiFENesin  1 tablet Oral BID   doxycycline  100 mg Oral Q12H   enoxaparin (LOVENOX) injection  40 mg Subcutaneous Q24H   fluticasone furoate-vilanterol  1 puff Inhalation Daily   And   umeclidinium bromide  1 puff Inhalation Daily   furosemide  40 mg Oral Daily   ipratropium  0.5 mg Nebulization Once   ipratropium-albuterol  3 mL Nebulization Q6H   methylPREDNISolone (SOLU-MEDROL) injection  40 mg Intravenous Q12H   nicotine  21 mg Transdermal Daily   pantoprazole  40 mg Oral Daily   sodium chloride flush  3 mL Intravenous Q12H   sodium chloride flush  3 mL Intravenous Q12H   Continuous Infusions:   LOS: 1 day    Shon Hale M.D on 10/29/2023 at 1:44 PM  Go to [email or web address] - for contact info  [facility] - Office  [phone]  If 7PM-7AM, please contact night-coverage [email or web address] 10/29/2023, 1:44 PM

## 2023-10-29 NOTE — Plan of Care (Signed)
  Problem: Education: Goal: Knowledge of General Education information will improve Description: Including pain rating scale, medication(s)/side effects and non-pharmacologic comfort measures Outcome: Progressing   Problem: Clinical Measurements: Goal: Respiratory complications will improve Outcome: Progressing   Problem: Activity: Goal: Risk for activity intolerance will decrease Outcome: Progressing   

## 2023-10-29 NOTE — Evaluation (Signed)
Physical Therapy Evaluation Patient Details Name: Miguel Marsh MRN: [id number] DOB: 05-04-1961 Today's Date: 10/29/2023  History of Present Illness  Miguel Marsh  is a 62 y.o. male  with medical history significant of GERD, COPD, chronic hypoxic respiratory failure on 3 LPM of oxygen at baseline (history of prior intubations for respiratory failure), HFpEF, history of metastatic NSCLC (follows with [facility]), HTN , history of polysubstance abuse and ongoing tobacco abuse returns to the ED on 10/27/2023  by EMS with increased shortness of breath required multiple nebulizer treatments and IV steroids from EMS PTA  -ABG in the ED showed uncompensated respiratory acidosis, after interventions in the ED including BiPAP and steroids and bronchodilators patient's respiratory status improved he was taken off BiPAP however he decompensated again had to be placed back on BiPAP in the ED   Clinical Impression  Patient functioning near baseline for functional mobility and gait with good return for ambulating in room/hallway without loss of balance or need for an AD.  Plan:  Patient discharged from physical therapy to care of nursing for ambulation daily as tolerated for length of stay.          If plan is discharge home, recommend the following: Help with stairs or ramp for entrance   Can travel by private vehicle        Equipment Recommendations None recommended by PT  Recommendations for Other Services       Functional Status Assessment Patient has had a recent decline in their functional status and/or demonstrates limited ability to make significant improvements in function in a reasonable and predictable amount of time     Precautions / Restrictions Precautions Precautions: None Restrictions Weight Bearing Restrictions Per Provider Order: No      Mobility  Bed Mobility Overal bed mobility: Independent                  Transfers Overall transfer  level: Independent                      Ambulation/Gait Ambulation/Gait assistance: Modified independent (Device/Increase time) Gait Distance (Feet): 75 Feet Assistive device: None Gait Pattern/deviations: WFL(Within Functional Limits) Gait velocity: decreased     General Gait Details: slightly labored movement with good return for ambulating in room, hallway without loss of balance or need for an AD  Stairs            Wheelchair Mobility     Tilt Bed    Modified Rankin (Stroke Patients Only)       Balance Overall balance assessment: No apparent balance deficits (not formally assessed)                                           Pertinent Vitals/Pain Pain Assessment Pain Assessment: No/denies pain    Home Living Family/patient expects to be discharged to:: Private residence Living Arrangements: Alone Available Help at Discharge: Personal care attendant;Available PRN/intermittently Type of Home: Apartment Home Access: Elevator       Home Layout: One level Home Equipment: Rollator (4 wheels);Cane - single point Additional Comments: PT states he normally walks without his cane    Prior Function Prior Level of Function : Independent/Modified Independent             Mobility Comments: household ambulator without AD, uses electric carts  in stores ADLs Comments:  Assisted by home aides     Extremity/Trunk Assessment   Upper Extremity Assessment Upper Extremity Assessment: Overall WFL for tasks assessed    Lower Extremity Assessment Lower Extremity Assessment: Overall WFL for tasks assessed    Cervical / Trunk Assessment Cervical / Trunk Assessment: Normal  Communication   Communication Communication: No apparent difficulties  Cognition Arousal: Alert Behavior During Therapy: WFL for tasks assessed/performed Overall Cognitive Status: Within Functional Limits for tasks assessed                                           General Comments      Exercises     Assessment/Plan    PT Assessment Patient does not need any further PT services  PT Problem List         PT Treatment Interventions      PT Goals (Current goals can be found in the Care Plan section)  Acute Rehab PT Goals Patient Stated Goal: return home with home aides to assist PT Goal Formulation: With patient Time For Goal Achievement: 10/29/23 Potential to Achieve Goals: Good    Frequency       Co-evaluation               AM-PAC PT "6 Clicks" Mobility  Outcome Measure Help needed turning from your back to your side while in a flat bed without using bedrails?: None Help needed moving from lying on your back to sitting on the side of a flat bed without using bedrails?: None Help needed moving to and from a bed to a chair (including a wheelchair)?: None Help needed standing up from a chair using your arms (e.g., wheelchair or bedside chair)?: None Help needed to walk in hospital room?: None Help needed climbing 3-5 steps with a railing? : A Little 6 Click Score: 23    End of Session Equipment Utilized During Treatment: Oxygen Activity Tolerance: Patient tolerated treatment well;Patient limited by fatigue Patient left: in bed;with call bell/phone within reach Nurse Communication: Mobility status PT Visit Diagnosis: Unsteadiness on feet (R26.81);Other abnormalities of gait and mobility (R26.89);Muscle weakness (generalized) (M62.81)    Time: 3086-5784 PT Time Calculation (min) (ACUTE ONLY): 16 min   Charges:   PT Evaluation $PT Eval Low Complexity: 1 Low PT Treatments $Therapeutic Activity: 8-22 mins PT General Charges $$ ACUTE PT VISIT: 1 Visit         3:28 PM, 10/29/23 Ocie Bob, MPT Physical Therapist with [facility] [phone] office [phone] mobile phone

## 2023-10-30 DIAGNOSIS — J441 Chronic obstructive pulmonary disease with (acute) exacerbation: Secondary | ICD-10-CM | POA: Diagnosis not present

## 2023-10-30 LAB — GLUCOSE, CAPILLARY: Glucose-Capillary: 80 mg/dL (ref 70–99)

## 2023-10-30 MED ORDER — IPRATROPIUM-ALBUTEROL 0.5-2.5 (3) MG/3ML IN SOLN
3.0000 mL | Freq: Three times a day (TID) | RESPIRATORY_TRACT | Status: DC
  Administered 2023-10-31 – 2023-11-01 (×5): 3 mL via RESPIRATORY_TRACT
  Filled 2023-10-30 (×5): qty 3

## 2023-10-30 NOTE — Plan of Care (Signed)

## 2023-10-30 NOTE — Progress Notes (Signed)
 PROGRESS NOTE  Miguel Marsh, is a 62 y.o. male, DOB - Apr 08, 1961, FMW:[id number]  Admit date - 10/27/2023   Admitting Physician Gunnison Chahal Pearlean, MD  Outpatient Primary MD for the patient is Renato Dorothey HERO, NP  LOS - 2  Chief Complaint  Patient presents with   Shortness of Breath      Brief Narrative:  62 y.o. male  with medical history significant of GERD, COPD, chronic hypoxic respiratory failure on 3 LPM of oxygen  at baseline (history of prior intubations for respiratory failure), HFpEF, history of metastatic NSCLC (follows with [facility]), HTN , history of polysubstance abuse and ongoing tobacco abuse admitted on 10/28/2023 with acute on chronic hypoxic and hypercapnic respiratory failure with uncompensated respiratory acidosis requiring BiPAP discharge on admission    -Assessment and Plan: 1)Acute on chronic hypoxic and hypercapnic respiratory failure--- due to COPD exacerbation -Patient with increased shortness of breath required multiple nebulizer treatments and IV steroids from EMS PTA -ABG in the ED showed uncompensated respiratory acidosis, after interventions in the ED including BiPAP and steroids and bronchodilators patient's respiratory status improved he was taken off BiPAP however he decompensated again had to be placed back on BiPAP in the ED -COVID, influenza and RSV negative --- EKG sinus tach without acute findings 10/30/23 -cough, wheezing and dyspnea is Not worse--significant dyspnea on exertion with ambulation  -Patient was more compliant with CPAP/BiPAP overnight -Continue IV Solu-Medrol , doxycycline , bronchodilators mucolytic's -Encourage BiPAP use as needed and nightly -Oxygen  requirement improving currently down to 5 L via nasal cannula -c/n BiPAP nightly and as needed   2)Acute COPD exacerbation---with ongoing tobacco abuse --Management as above #1   3)Recurrent Lung Cancer--- initially diagnosed with lung cancer in 2016 -RLL  cavitary nodule, history of NSCLC: 9mm RUL GGO, 1.9x1.8cm irregular potentially cavitary RLL nodule on CTA 06/25/2023 --patient follows with [facility] -Lung nodule-1.9 x 1.9 cm irregular nodule in the right lower lobe is similar in size and demonstration and now solid appearance without focal central lucency these are concerning for malignancy.  -Outpatient follow-up with his oncologist at [facility] advised   4)HFpEF-Chronic Diastolic Dysfunction CHF --Echo from 03/06/2023 with EF of 60 to 65% -Prior echo from 2019 was consistent with EF of 65 to 60% and grade 1 diastolic dysfunction 10/30/23 -Stable at this time, no acute exacerbation (appears euvolemic) -c/n PTA Lasix  40 mg daily   5)GERD--- continue protonix  especially while on steroids   6)H/o Cocaine Abuse--patient is not ready to quit -Urine tox positive for cocaine this admission   7)Tobacco Abuse--- smoking cessation advised patient not ready to quit -c/n Nicotine  patch  Status is: Inpatient   Disposition: The patient is from: Home              Anticipated d/c is to: Home              Anticipated d/c date is: 1 day              Patient currently is not medically stable to d/c. Barriers: Not Clinically Stable-   Code Status :  -  Code Status: Full Code   Family Communication:    NA (patient is alert, awake and coherent)   DVT Prophylaxis  :   - SCDs  enoxaparin  (LOVENOX ) injection 40 mg Start: 10/28/23 2200 SCDs Start: 10/28/23 0806 Place TED hose Start: 10/28/23 0806   Lab Results  Component Value Date   PLT 206 10/27/2023    Inpatient Medications  Scheduled Meds:  albuterol   20 mg/hr Nebulization Once   dextromethorphan -guaiFENesin   1 tablet Oral BID   doxycycline   100 mg Oral Q12H   enoxaparin  (LOVENOX ) injection  40 mg Subcutaneous Q24H   fluticasone  furoate-vilanterol  1 puff Inhalation Daily   And   umeclidinium bromide   1 puff Inhalation Daily   furosemide   40 mg Oral Daily   ipratropium  0.5  mg Nebulization Once   ipratropium-albuterol   3 mL Nebulization Q6H   methylPREDNISolone  (SOLU-MEDROL ) injection  40 mg Intravenous Q12H   nicotine   21 mg Transdermal Daily   pantoprazole   40 mg Oral Daily   sodium chloride  flush  3 mL Intravenous Q12H   sodium chloride  flush  3 mL Intravenous Q12H   Continuous Infusions: PRN Meds:.acetaminophen  **OR** acetaminophen , albuterol , bisacodyl , clonazePAM , ondansetron  **OR** ondansetron  (ZOFRAN ) IV, mouth rinse, oxyCODONE , polyethylene glycol, sodium chloride  flush   Anti-infectives (From admission, onward)    Start     Dose/Rate Route Frequency Ordered Stop   10/28/23 1000  doxycycline  (VIBRA -TABS) tablet 100 mg        100 mg Oral Every 12 hours 10/28/23 0800        Subjective: Miguel Marsh today has no fevers, no emesis,  No chest pain,   -cough, wheezing and dyspnea is Not worse -Patient was more compliant with BiPAP overnight -Dyspnea on exertion remains an issue  Objective: Vitals:   10/30/23 0328 10/30/23 0719 10/30/23 1224 10/30/23 1300  BP: (!) 149/79   134/71  Pulse: 64   73  Resp: 18     Temp: 98.2 F (36.8 C)   98.5 F (36.9 C)  TempSrc: Oral   Oral  SpO2: 99% 99% 99% 97%  Weight:      Height:        Intake/Output Summary (Last 24 hours) at 10/30/2023 1910 Last data filed at 10/30/2023 1700 Gross per 24 hour  Intake 965 ml  Output 1300 ml  Net -335 ml   Filed Weights   10/27/23 2325 10/28/23 0838  Weight: 71 kg 72.8 kg    Physical Exam Gen:- Awake Alert, no conversational dyspnea, he has dyspnea with minimal exertion  HEENT:- [place].AT, No sclera icterus Nose- [place] 5L/min Neck-Supple Neck,No JVD,.  Lungs-improving air movement, overall air movement is not great though, few scattered wheezes  CV- S1, S2 normal, regular  Abd-  +ve B.Sounds, Abd Soft, No tenderness,    Extremity/Skin:- No  edema, pedal pulses present  Psych-affect is appropriate, oriented x3 Neuro--generalized weakness, no new focal  deficits, no tremors  Data Reviewed: I have personally reviewed following labs and imaging studies  CBC: Recent Labs  Lab 10/27/23 2335  WBC 6.5  NEUTROABS 2.6  HGB 12.5*  HCT 40.4  MCV 96.0  PLT 206   Basic Metabolic Panel: Recent Labs  Lab 10/27/23 2335 10/29/23 0414  NA 141 139  K 4.1 4.6  CL 98 99  CO2 36* 33*  GLUCOSE 153* 136*  BUN 15 11  CREATININE 0.71 0.58*  CALCIUM 8.6* 9.0   GFR: Estimated Creatinine Clearance: 86.4 mL/min (A) (by C-G formula based on SCr of 0.58 mg/dL (L)).  Recent Results (from the past 240 hours)  Resp panel by RT-PCR (RSV, Flu A&B, Covid) Anterior Nasal Swab     Status: None   Collection Time: 10/27/23 11:34 PM   Specimen: Anterior Nasal Swab  Result Value Ref Range Status   SARS Coronavirus 2 by RT PCR NEGATIVE NEGATIVE Final    Comment: (NOTE)  SARS-CoV-2 target nucleic acids are NOT DETECTED.  The SARS-CoV-2 RNA is generally detectable in upper respiratory specimens during the acute phase of infection. The lowest concentration of SARS-CoV-2 viral copies this assay can detect is 138 copies/mL. A negative result does not preclude SARS-Cov-2 infection and should not be used as the sole basis for treatment or other patient management decisions. A negative result may occur with  improper specimen collection/handling, submission of specimen other than nasopharyngeal swab, presence of viral mutation(s) within the areas targeted by this assay, and inadequate number of viral copies(<138 copies/mL). A negative result must be combined with clinical observations, patient history, and epidemiological information. The expected result is Negative.  Fact Sheet for Patients:  bloggercourse.com  Fact Sheet for Healthcare Providers:  seriousbroker.it  This test is no t yet approved or cleared by the [place]  FDA and  has been authorized for detection and/or diagnosis of SARS-CoV-2 by FDA  under an Emergency Use Authorization (EUA). This EUA will remain  in effect (meaning this test can be used) for the duration of the COVID-19 declaration under Section 564(b)(1) of the Act, 21 U.S.C.section 360bbb-3(b)(1), unless the authorization is terminated  or revoked sooner.       Influenza A by PCR NEGATIVE NEGATIVE Final   Influenza B by PCR NEGATIVE NEGATIVE Final    Comment: (NOTE) The Xpert Xpress SARS-CoV-2/FLU/RSV plus assay is intended as an aid in the diagnosis of influenza from Nasopharyngeal swab specimens and should not be used as a sole basis for treatment. Nasal washings and aspirates are unacceptable for Xpert Xpress SARS-CoV-2/FLU/RSV testing.  Fact Sheet for Patients: bloggercourse.com  Fact Sheet for Healthcare Providers: seriousbroker.it  This test is not yet approved or cleared by the [place]  FDA and has been authorized for detection and/or diagnosis of SARS-CoV-2 by FDA under an Emergency Use Authorization (EUA). This EUA will remain in effect (meaning this test can be used) for the duration of the COVID-19 declaration under Section 564(b)(1) of the Act, 21 U.S.C. section 360bbb-3(b)(1), unless the authorization is terminated or revoked.     Resp Syncytial Virus by PCR NEGATIVE NEGATIVE Final    Comment: (NOTE) Fact Sheet for Patients: bloggercourse.com  Fact Sheet for Healthcare Providers: seriousbroker.it  This test is not yet approved or cleared by the [place]  FDA and has been authorized for detection and/or diagnosis of SARS-CoV-2 by FDA under an Emergency Use Authorization (EUA). This EUA will remain in effect (meaning this test can be used) for the duration of the COVID-19 declaration under Section 564(b)(1) of the Act, 21 U.S.C. section 360bbb-3(b)(1), unless the authorization is terminated or revoked.  Performed at [facility], [address] [place], [place] [address]   MRSA Next Gen by PCR, Nasal     Status: None   Collection Time: 10/28/23  8:23 AM   Specimen: Nasal Mucosa; Nasal Swab  Result Value Ref Range Status   MRSA by PCR Next Gen NOT DETECTED NOT DETECTED Final    Comment: (NOTE) The GeneXpert MRSA Assay (FDA approved for NASAL specimens only), is one component of a comprehensive MRSA colonization surveillance program. It is not intended to diagnose MRSA infection nor to guide or monitor treatment for MRSA infections. Test performance is not FDA approved in patients less than 68 years old. Performed at [facility], [address] [place], [place] [address]     Radiology Studies: No results found.  Scheduled Meds:  albuterol   20 mg/hr Nebulization Once   dextromethorphan -guaiFENesin   1 tablet Oral BID   doxycycline   100 mg Oral Q12H   enoxaparin  (LOVENOX ) injection  40 mg Subcutaneous Q24H   fluticasone  furoate-vilanterol  1 puff Inhalation Daily   And   umeclidinium bromide   1 puff Inhalation Daily   furosemide   40 mg Oral Daily   ipratropium  0.5 mg Nebulization Once   ipratropium-albuterol   3 mL Nebulization Q6H   methylPREDNISolone  (SOLU-MEDROL ) injection  40 mg Intravenous Q12H   nicotine   21 mg Transdermal Daily   pantoprazole   40 mg Oral Daily   sodium chloride  flush  3 mL Intravenous Q12H   sodium chloride  flush  3 mL Intravenous Q12H   Continuous Infusions:   LOS: 2 days   Rendall Carwin M.D on 10/30/2023 at 7:10 PM  Go to [email or web address] - for contact info  [facility] - Office  [phone]  If 7PM-7AM, please contact night-coverage [email or web address] 10/30/2023, 7:10 PM

## 2023-10-31 DIAGNOSIS — J441 Chronic obstructive pulmonary disease with (acute) exacerbation: Secondary | ICD-10-CM | POA: Diagnosis not present

## 2023-10-31 NOTE — Progress Notes (Signed)
 Patient request to go on BIPAP for the night after breathing treatment. RT placed patient on previous BIPAP settings with 4 lpm bled in.

## 2023-10-31 NOTE — Progress Notes (Signed)
 PROGRESS NOTE  Miguel Marsh, is a 63 y.o. male, DOB - 06-13-1961, FMW:[id number]  Admit date - 10/27/2023   Admitting Physician Staton Markey Pearlean, MD  Outpatient Primary MD for the patient is Renato Dorothey HERO, NP  LOS - 3  Chief Complaint  Patient presents with   Shortness of Breath      Brief Narrative:  63 y.o. male  with medical history significant of GERD, COPD, chronic hypoxic respiratory failure on 3 LPM of oxygen  at baseline (history of prior intubations for respiratory failure), HFpEF, history of metastatic NSCLC (follows with [facility]), HTN , history of polysubstance abuse and ongoing tobacco abuse admitted on 10/28/2023 with acute on chronic hypoxic and hypercapnic respiratory failure with uncompensated respiratory acidosis requiring BiPAP discharge on admission   -Assessment and Plan: 1)Acute on chronic hypoxic and hypercapnic respiratory failure--- due to COPD exacerbation -Patient with increased shortness of breath required multiple nebulizer treatments and IV steroids from EMS PTA -ABG in the ED showed uncompensated respiratory acidosis, after interventions in the ED including BiPAP and steroids and bronchodilators patient's respiratory status improved he was taken off BiPAP however he decompensated again had to be placed back on BiPAP in the ED -COVID, influenza and RSV negative --- EKG sinus tach without acute findings 10/31/23 -cough, wheezing and dyspnea is Not worse--he still has significant dyspnea on exertion with ambulation  -Patient was more compliant with BiPAP over last couple of days  -Continue IV Solu-Medrol , doxycycline , bronchodilators mucolytic's -Encourage BiPAP use as needed and nightly -Oxygen  requirement is not worse currently down to 5 L via nasal cannula -   2)Acute COPD exacerbation---with ongoing tobacco abuse --Management as above #1   3)Recurrent Lung Cancer--- initially diagnosed with lung cancer in 2016 -RLL  cavitary nodule, history of NSCLC: 9mm RUL GGO, 1.9x1.8cm irregular potentially cavitary RLL nodule on CTA 06/25/2023 --patient [facility] with [facility] -Lung nodule-1.9 x 1.9 cm irregular nodule in the right lower lobe is similar in size and demonstration and now solid appearance without focal central lucency these are concerning for malignancy.  -Outpatient follow-up with his oncologist at [facility] advised   4)HFpEF-Chronic Diastolic Dysfunction CHF --Echo from 03/06/2023 with EF of 60 to 65% -Prior echo from 2019 was consistent with EF of 65 to 60% and grade 1 diastolic dysfunction 10/31/23 -Stable at this time, no acute exacerbation (appears euvolemic) -c/n PTA Lasix  40 mg daily   5)GERD--- continue protonix  especially while on steroids   6)H/o Cocaine Abuse--patient is not ready to quit -Urine tox positive for cocaine this admission   7)Tobacco Abuse--- smoking cessation advised patient not ready to quit -c/n Nicotine  patch  Status is: Inpatient   Disposition: The patient is from: Home              Anticipated d/c is to: Home              Anticipated d/c date is: 1 day              Patient currently is not medically stable to d/c. Barriers: Not Clinically Stable-   Code Status :  -  Code Status: Full Code   Family Communication:    NA (patient is alert, awake and coherent)   DVT Prophylaxis  :   - SCDs  enoxaparin  (LOVENOX ) injection 40 mg Start: 10/28/23 2200 SCDs Start: 10/28/23 0806 Place TED hose Start: 10/28/23 0806   Lab Results  Component Value Date   PLT 206 10/27/2023  Inpatient Medications  Scheduled Meds:  dextromethorphan -guaiFENesin   1 tablet Oral BID   doxycycline   100 mg Oral Q12H   enoxaparin  (LOVENOX ) injection  40 mg Subcutaneous Q24H   fluticasone  furoate-vilanterol  1 puff Inhalation Daily   And   umeclidinium bromide   1 puff Inhalation Daily   furosemide   40 mg Oral Daily   ipratropium-albuterol   3 mL Nebulization TID    methylPREDNISolone  (SOLU-MEDROL ) injection  40 mg Intravenous Q12H   nicotine   21 mg Transdermal Daily   pantoprazole   40 mg Oral Daily   sodium chloride  flush  3 mL Intravenous Q12H   sodium chloride  flush  3 mL Intravenous Q12H   Continuous Infusions: PRN Meds:.acetaminophen  **OR** acetaminophen , albuterol , bisacodyl , clonazePAM , ondansetron  **OR** ondansetron  (ZOFRAN ) IV, mouth rinse, oxyCODONE , polyethylene glycol, sodium chloride  flush   Anti-infectives (From admission, onward)    Start     Dose/Rate Route Frequency Ordered Stop   10/28/23 1000  doxycycline  (VIBRA -TABS) tablet 100 mg        100 mg Oral Every 12 hours 10/28/23 0800        Subjective: Miguel Marsh today has no fevers, no emesis,  No chest pain,   -cough, wheezing and dyspnea is Not worse Remains dyspneic with minimal activity  Objective: Vitals:   10/31/23 0514 10/31/23 1010 10/31/23 1413 10/31/23 1435  BP: 122/72   109/67  Pulse: 68   78  Resp:  14  20  Temp: 98.4 F (36.9 C)   98.2 F (36.8 C)  TempSrc: Oral   Oral  SpO2: 100%  98% 99%  Weight:      Height:        Intake/Output Summary (Last 24 hours) at 10/31/2023 1733 Last data filed at 10/31/2023 1436 Gross per 24 hour  Intake 865 ml  Output 1300 ml  Net -435 ml   Filed Weights   10/27/23 2325 10/28/23 0838  Weight: 71 kg 72.8 kg    Physical Exam Gen:- Awake Alert, no conversational dyspnea, he has dyspnea with minimal exertion  HEENT:- [place].AT, No sclera icterus Nose- [place] 5L/min--alternating BIPAP Neck-Supple Neck,No JVD,.  Lungs-improving air movement, overall air movement is not great though, few scattered wheezes  CV- S1, S2 normal, regular  Abd-  +ve B.Sounds, Abd Soft, No tenderness,    Extremity/Skin:- No  edema, pedal pulses present  Psych-affect is appropriate, oriented x3 Neuro--Generalized weakness, no new focal deficits, no tremors  Data Reviewed: I have personally reviewed following labs and imaging studies  CBC: Recent  Labs  Lab 10/27/23 2335  WBC 6.5  NEUTROABS 2.6  HGB 12.5*  HCT 40.4  MCV 96.0  PLT 206   Basic Metabolic Panel: Recent Labs  Lab 10/27/23 2335 10/29/23 0414  NA 141 139  K 4.1 4.6  CL 98 99  CO2 36* 33*  GLUCOSE 153* 136*  BUN 15 11  CREATININE 0.71 0.58*  CALCIUM 8.6* 9.0   GFR: Estimated Creatinine Clearance: 86.4 mL/min (A) (by C-G formula based on SCr of 0.58 mg/dL (L)).  Recent Results (from the past 240 hours)  Resp panel by RT-PCR (RSV, Flu A&B, Covid) Anterior Nasal Swab     Status: None   Collection Time: 10/27/23 11:34 PM   Specimen: Anterior Nasal Swab  Result Value Ref Range Status   SARS Coronavirus 2 by RT PCR NEGATIVE NEGATIVE Final    Comment: (NOTE) SARS-CoV-2 target nucleic acids are NOT DETECTED.  The SARS-CoV-2 RNA is generally detectable in upper respiratory specimens during the acute  phase of infection. The lowest concentration of SARS-CoV-2 viral copies this assay can detect is 138 copies/mL. A negative result does not preclude SARS-Cov-2 infection and should not be used as the sole basis for treatment or other patient management decisions. A negative result may occur with  improper specimen collection/handling, submission of specimen other than nasopharyngeal swab, presence of viral mutation(s) within the areas targeted by this assay, and inadequate number of viral copies(<138 copies/mL). A negative result must be combined with clinical observations, patient history, and epidemiological information. The expected result is Negative.  Fact Sheet for Patients:  bloggercourse.com  Fact Sheet for Healthcare Providers:  seriousbroker.it  This test is no t yet approved or cleared by the [place]  FDA and  has been authorized for detection and/or diagnosis of SARS-CoV-2 by FDA under an Emergency Use Authorization (EUA). This EUA will remain  in effect (meaning this test can be used) for the  duration of the COVID-19 declaration under Section 564(b)(1) of the Act, 21 U.S.C.section 360bbb-3(b)(1), unless the authorization is terminated  or revoked sooner.       Influenza A by PCR NEGATIVE NEGATIVE Final   Influenza B by PCR NEGATIVE NEGATIVE Final    Comment: (NOTE) The Xpert Xpress SARS-CoV-2/FLU/RSV plus assay is intended as an aid in the diagnosis of influenza from Nasopharyngeal swab specimens and should not be used as a sole basis for treatment. Nasal washings and aspirates are unacceptable for Xpert Xpress SARS-CoV-2/FLU/RSV testing.  Fact Sheet for Patients: bloggercourse.com  Fact Sheet for Healthcare Providers: seriousbroker.it  This test is not yet approved or cleared by the [place]  FDA and has been authorized for detection and/or diagnosis of SARS-CoV-2 by FDA under an Emergency Use Authorization (EUA). This EUA will remain in effect (meaning this test can be used) for the duration of the COVID-19 declaration under Section 564(b)(1) of the Act, 21 U.S.C. section 360bbb-3(b)(1), unless the authorization is terminated or revoked.     Resp Syncytial Virus by PCR NEGATIVE NEGATIVE Final    Comment: (NOTE) Fact Sheet for Patients: bloggercourse.com  Fact Sheet for Healthcare Providers: seriousbroker.it  This test is not yet approved or cleared by the [place]  FDA and has been authorized for detection and/or diagnosis of SARS-CoV-2 by FDA under an Emergency Use Authorization (EUA). This EUA will remain in effect (meaning this test can be used) for the duration of the COVID-19 declaration under Section 564(b)(1) of the Act, 21 U.S.C. section 360bbb-3(b)(1), unless the authorization is terminated or revoked.  Performed at [facility], [address] [place], [place] [address]   MRSA Next Gen by PCR, Nasal     Status: None   Collection Time:  10/28/23  8:23 AM   Specimen: Nasal Mucosa; Nasal Swab  Result Value Ref Range Status   MRSA by PCR Next Gen NOT DETECTED NOT DETECTED Final    Comment: (NOTE) The GeneXpert MRSA Assay (FDA approved for NASAL specimens only), is one component of a comprehensive MRSA colonization surveillance program. It is not intended to diagnose MRSA infection nor to guide or monitor treatment for MRSA infections. Test performance is not FDA approved in patients less than 71 years old. Performed at [facility], [address] [place], [place] [address]     Radiology Studies: No results found.  Scheduled Meds:  dextromethorphan -guaiFENesin   1 tablet Oral BID   doxycycline   100 mg Oral Q12H   enoxaparin  (LOVENOX ) injection  40 mg Subcutaneous Q24H   fluticasone  furoate-vilanterol  1 puff  Inhalation Daily   And   umeclidinium bromide   1 puff Inhalation Daily   furosemide   40 mg Oral Daily   ipratropium-albuterol   3 mL Nebulization TID   methylPREDNISolone  (SOLU-MEDROL ) injection  40 mg Intravenous Q12H   nicotine   21 mg Transdermal Daily   pantoprazole   40 mg Oral Daily   sodium chloride  flush  3 mL Intravenous Q12H   sodium chloride  flush  3 mL Intravenous Q12H   Continuous Infusions:   LOS: 3 days   Rendall Carwin M.D on 10/31/2023 at 5:33 PM  Go to [email or web address] - for contact info  [facility] - Office  [phone]  If 7PM-7AM, please contact night-coverage [email or web address] 10/31/2023, 5:33 PM

## 2023-10-31 NOTE — Progress Notes (Signed)
 Mobility Specialist Progress Note:    10/31/23 1149  Mobility  Activity Ambulated with assistance in hallway  Level of Assistance Modified independent, requires aide device or extra time  Assistive Device None  Distance Ambulated (ft) 65 ft  Range of Motion/Exercises Active;All extremities  Activity Response Tolerated well  Mobility Referral Yes  Mobility visit 1 Mobility  Mobility Specialist Start Time (ACUTE ONLY) 1130  Mobility Specialist Stop Time (ACUTE ONLY) 1150  Mobility Specialist Time Calculation (min) (ACUTE ONLY) 20 min   Pt received sitting EOB, agreeable to mobility. ModI to stand and ambulate with no AD. Tolerated well, pt c/o SOB, fatigue, and expresses anxiety about going home if SOB does not improve. SpO2 98% on 3L throughout session, pt stated they just used CPAP machine as well. Returned to room, left sitting EOB. All needs met.   Sherrilee Ditty Mobility Specialist Please contact via Special Educational Needs Teacher or  Rehab office at [phone]

## 2023-11-01 DIAGNOSIS — J441 Chronic obstructive pulmonary disease with (acute) exacerbation: Secondary | ICD-10-CM | POA: Diagnosis not present

## 2023-11-01 LAB — GLUCOSE, CAPILLARY: Glucose-Capillary: 97 mg/dL (ref 70–99)

## 2023-11-01 MED ORDER — NICOTINE 21 MG/24HR TD PT24: 21.0000 mg | MEDICATED_PATCH | Freq: Every day | TRANSDERMAL | 0 refills | Status: DC

## 2023-11-01 MED ORDER — OMEPRAZOLE 40 MG PO CPDR: 40.0000 mg | DELAYED_RELEASE_CAPSULE | Freq: Every day | ORAL | 3 refills | Status: DC

## 2023-11-01 MED ORDER — CLONAZEPAM 1 MG PO TABS: 1.0000 mg | ORAL_TABLET | Freq: Two times a day (BID) | ORAL | Status: DC | PRN

## 2023-11-01 MED ORDER — PREDNISONE 20 MG PO TABS
40.0000 mg | ORAL_TABLET | Freq: Every day | ORAL | 0 refills | Status: AC
Start: 2023-11-01 — End: 2023-11-06

## 2023-11-01 MED ORDER — PREDNISONE 20 MG PO TABS: 50.0000 mg | ORAL_TABLET | Freq: Once | ORAL | Status: DC

## 2023-11-01 MED ORDER — ALBUTEROL SULFATE (2.5 MG/3ML) 0.083% IN NEBU: 2.5000 mg | INHALATION_SOLUTION | Freq: Four times a day (QID) | RESPIRATORY_TRACT | 3 refills | Status: DC | PRN

## 2023-11-01 MED ORDER — DOXYCYCLINE HYCLATE 100 MG PO TABS: 100.0000 mg | ORAL_TABLET | Freq: Two times a day (BID) | ORAL | 0 refills | Status: AC

## 2023-11-01 MED ORDER — ALBUTEROL SULFATE HFA 108 (90 BASE) MCG/ACT IN AERS: 2.0000 | INHALATION_SPRAY | Freq: Four times a day (QID) | RESPIRATORY_TRACT | 3 refills | Status: DC | PRN

## 2023-11-01 MED ORDER — BREZTRI AEROSPHERE 160-9-4.8 MCG/ACT IN AERO: 2.0000 | INHALATION_SPRAY | Freq: Two times a day (BID) | RESPIRATORY_TRACT | 3 refills | Status: DC

## 2023-11-01 MED ORDER — FUROSEMIDE 40 MG PO TABS: 20.0000 mg | ORAL_TABLET | ORAL | 1 refills | Status: DC

## 2023-11-01 MED ORDER — GUAIFENESIN ER 600 MG PO TB12: 600.0000 mg | ORAL_TABLET | Freq: Two times a day (BID) | ORAL | 0 refills | Status: AC

## 2023-11-01 NOTE — Discharge Summary (Signed)
 Miguel Marsh, is a 63 y.o. male  DOB 05/01/1961  MRN [id number].  Admission date:  10/27/2023  Admitting Physician  Rendall Carwin, MD  Discharge Date:  11/01/2023   Primary MD  Renato Dorothey HERO, NP  Recommendations for primary care physician for things to follow:  1)you need oxygen  at home at 2 to 3 L via nasal cannula continuously while awake and while asleep--- smoking or having open fires around oxygen  can cause fire, significant injury and death  2) complete abstinence from tobacco and cocaine strongly advised  3)follow up with Renato Dorothey HERO, NP --primary care provider for recheck within a week  Admission Diagnosis  COPD exacerbation (HCC) [J44.1] Acute and chronic respiratory failure with hypercapnia (HCC) [J96.22] COPD with acute exacerbation (HCC) [J44.1] Normochromic normocytic anemia [D64.9]   Discharge Diagnosis  COPD exacerbation (HCC) [J44.1] Acute and chronic respiratory failure with hypercapnia (HCC) [J96.22] COPD with acute exacerbation (HCC) [J44.1] Normochromic normocytic anemia [D64.9]    Principal Problem:   COPD with acute exacerbation (HCC) Active Problems:   Essential hypertension   Tobacco use disorder   Non-small cell cancer of right lung (in remission)   GERD (gastroesophageal reflux disease)   Acute on chronic respiratory failure with hypoxia and hypercapnia (HCC)   Lung nodule      Past Medical History:  Diagnosis Date   Arthritis    Bronchitis    Cancer (HCC)    metastatic NSCLC (followed by [facility])   COPD (chronic obstructive pulmonary disease) (HCC)    HTN (hypertension)     Past Surgical History:  Procedure Laterality Date   LUNG BIOPSY     TOTAL HIP ARTHROPLASTY     TUMOR REMOVAL Right 12/04/2017    HPI  from the history and physical done on the day of admission:   Miguel Marsh  is a 63 y.o. male  with medical history significant of GERD,  COPD, chronic hypoxic respiratory failure on 3 LPM of oxygen  at baseline (history of prior intubations for respiratory failure), HFpEF, history of metastatic NSCLC (follows with [facility]), HTN , history of polysubstance abuse and ongoing tobacco abuse returns to the ED on 10/27/2023  by EMS with increased shortness of breath required multiple nebulizer treatments and IV steroids from EMS PTA -ABG in the ED showed uncompensated respiratory acidosis, after interventions in the ED including BiPAP and steroids and bronchodilators patient's respiratory status improved he was taken off BiPAP however he decompensated again had to be placed back on BiPAP in the ED -- At time of my evaluation patient is requesting to be taken off BiPAP again -Chest x-ray with stable findings including stable right basilar nodular density concerning for malignancy which is not new--- no new acute findings -COVID, influenza and RSV negative --- EKG sinus tach without acute findings -CBC with a WBC of 6.5 hemoglobin of 12.5, platelets 206 -Creatinine 0.71, potassium 4.1 sodium 141 , bicarb 36   Hospital Course:     Brief Narrative:  63 y.o. male  with  medical history significant of GERD, COPD, chronic hypoxic respiratory failure on 3 LPM of oxygen  at baseline (history of prior intubations for respiratory failure), HFpEF, history of metastatic NSCLC ([facility]s [facility] HTN , history of polysubstance abuse and ongoing tobacco abuse admitted on 10/28/2023 with acute on chronic hypoxic and hypercapnic respiratory failure with uncompensated respiratory acidosis requiring BiPAP discharge on admission   -Assessment and Plan: 1)Acute on chronic hypoxic and hypercapnic respiratory failure--- due to COPD exacerbation -Patient with increased shortness of breath required multiple nebulizer treatments and IV steroids from EMS PTA -ABG in the ED showed uncompensated respiratory  acidosis, after interventions in the ED including BiPAP and steroids and bronchodilators patient's respiratory status improved he was taken off BiPAP however he decompensated again had to be placed back on BiPAP in the ED -COVID, influenza and RSV negative --- EKG sinus tach without acute findings -cough, wheezing and dyspnea has improved significantly -Off BiPAP and doing well on nasal cannula -Treated with IV Solu-Medrol , doxycycline , bronchodilators mucolytic's --Okay to discharge on prednisone  and doxycycline  -Oxygen  requirement has improved, back to baseline of 2 to 3 L of oxygen  at this time. -  2)Acute COPD exacerbation---with ongoing tobacco abuse --Much improved overall--patient feels like he is back to baseline --Management as above #1   3)Recurrent Lung Cancer--- initially diagnosed with lung cancer in 2016 -RLL cavitary nodule, history of NSCLC: 9mm RUL GGO, 1.9x1.8cm irregular potentially cavitary RLL nodule on CTA 06/25/2023 --patient [facility] -Lung nodule-1.9 x 1.9 cm irregular nodule in the right lower lobe is similar in size and demonstration and now solid appearance without focal central lucency these are concerning for malignancy.  -Outpatient follow-up with his oncologist at [facility] advised   4)HFpEF-Chronic Diastolic Dysfunction CHF --Echo from 03/06/2023 with EF of 60 to 65% -Prior echo from 2019 was consistent with EF of 65 to 60% and grade 1 diastolic dysfunction -Stable at this time, no acute exacerbation (appears euvolemic) -PTA patient uses Lasix  as needed may continue to use it as needed for discharge   5)GERD--- continue protonix  especially while on steroids   6)H/o Cocaine Abuse--patient is not ready to quit -Urine tox positive for cocaine this admission   7)Tobacco Abuse---  Smoking cessation counseling for 4 minutes today, consider nicotine  patch I have discussed tobacco cessation with the patient.  I have counseled the patient  regarding the negative impacts of continued tobacco use including but not limited to lung cancer, COPD, and cardiovascular disease.  I have discussed alternatives to tobacco and modalities that may help facilitate tobacco cessation including but not limited to biofeedback, hypnosis, and medications.  Total time spent with tobacco counseling was 4 minutes.  Discharge Condition: stable, oxygen  requirement is back to baseline  Follow UP   Follow-up Information     Renato Dorothey HERO, NP. Schedule an appointment as soon as possible for a visit in 1 week(s).   Specialty: Internal Medicine Contact information: [address] [place] [address] [phone]                 Diet and Activity recommendation:  As advised  Discharge Instructions    Discharge Instructions     Call MD for:  difficulty breathing, headache or visual disturbances   Complete by: As directed    Call MD for:  persistant dizziness or light-headedness   Complete by: As directed    Call MD for:  persistant nausea and vomiting  Complete by: As directed    Call MD for:  redness, tenderness, or signs of infection (pain, swelling, redness, odor or green/yellow discharge around incision site)   Complete by: As directed    Call MD for:  temperature >100.4   Complete by: As directed    Diet - low sodium heart healthy   Complete by: As directed    Discharge instructions   Complete by: As directed    1)you need oxygen  at home at 2 to 3 L via nasal cannula continuously while awake and while asleep--- smoking or having open fires around oxygen  can cause fire, significant injury and death  2) complete abstinence from tobacco and cocaine strongly advised  3)follow up with Renato Dorothey HERO, NP --primary care provider for recheck within a week   Increase activity slowly   Complete by: As directed          Discharge Medications     Allergies as of 11/01/2023       Reactions   Ibuprofen  Shortness Of Breath         Medication List     TAKE these medications    acetaminophen  325 MG tablet Commonly known as: TYLENOL  Take 2 tablets (650 mg total) by mouth every 6 (six) hours as needed for mild pain (pain score 1-3) (or Fever >/= 101).   albuterol  (2.5 MG/3ML) 0.083% nebulizer solution Commonly known as: PROVENTIL  Inhale 3 mLs (2.5 mg total) into the lungs every 6 (six) hours as needed for wheezing or shortness of breath.   albuterol  108 (90 Base) MCG/ACT inhaler Commonly known as: VENTOLIN  HFA Inhale 2 puffs into the lungs every 6 (six) hours as needed.   Breztri  Aerosphere 160-9-4.8 MCG/ACT Aero Generic drug: Budeson-Glycopyrrol-Formoterol  Inhale 2 puffs into the lungs 2 (two) times daily.   clonazePAM  1 MG tablet Commonly known as: KLONOPIN  Take 1 tablet (1 mg total) by mouth 2 (two) times daily as needed for anxiety. What changed:  when to take this reasons to take this   doxycycline  100 MG tablet Commonly known as: VIBRA -TABS Take 1 tablet (100 mg total) by mouth 2 (two) times daily for 5 days.   furosemide  40 MG tablet Commonly known as: LASIX  Take 0.5 tablets (20 mg total) by mouth See admin instructions. Take once a day  as needed for fluid/swelling What changed:  how much to take when to take this additional instructions   guaiFENesin  600 MG 12 hr tablet Commonly known as: Mucinex  Take 1 tablet (600 mg total) by mouth 2 (two) times daily for 10 days.   nicotine  21 mg/24hr patch Commonly known as: NICODERM CQ  - dosed in mg/24 hours Place 1 patch (21 mg total) onto the skin daily. Start taking on: November 02, 2023   omeprazole  40 MG capsule Commonly known as: PRILOSEC Take 1 capsule (40 mg total) by mouth daily.   oxyCODONE  15 MG immediate release tablet Commonly known as: ROXICODONE  Take 15 mg by mouth every 4 (four) hours as needed for pain.   OXYGEN  Inhale 3 L into the lungs continuous.   predniSONE  20 MG tablet Commonly known as: DELTASONE  Take 2  tablets (40 mg total) by mouth daily with breakfast for 5 days.        Major procedures and Radiology Reports - PLEASE review detailed and final reports for all details, in brief -   DG Chest Port 1 View Result Date: 10/27/2023 CLINICAL DATA:  Shortness of breath EXAM: PORTABLE CHEST 1 VIEW COMPARISON:  08/13/2019 FINDINGS: Cardiac shadow is stable. The lungs are well aerated bilaterally. Stable nodular density in the right base is again seen similar to that seen on prior CT examination. No bony abnormality is noted. IMPRESSION: Stable right basilar nodular density when compared with the prior CT again concerning for malignancy. No new focal abnormality is noted. Electronically Signed   By: Oneil Devonshire M.D.   On: 10/27/2023 23:57    Micro Results   Recent Results (from the past 240 hours)  Resp panel by RT-PCR (RSV, Flu A&B, Covid) Anterior Nasal Swab     Status: None   Collection Time: 10/27/23 11:34 PM   Specimen: Anterior Nasal Swab  Result Value Ref Range Status   SARS Coronavirus 2 by RT PCR NEGATIVE NEGATIVE Final    Comment: (NOTE) SARS-CoV-2 target nucleic acids are NOT DETECTED.  The SARS-CoV-2 RNA is generally detectable in upper respiratory specimens during the acute phase of infection. The lowest concentration of SARS-CoV-2 viral copies this assay can detect is 138 copies/mL. A negative result does not preclude SARS-Cov-2 infection and should not be used as the sole basis for treatment or other patient management decisions. A negative result may occur with  improper specimen collection/handling, submission of specimen other than nasopharyngeal swab, presence of viral mutation(s) within the areas targeted by this assay, and inadequate number of viral copies(<138 copies/mL). A negative result must be combined with clinical observations, patient history, and epidemiological information. The expected result is Negative.  Fact Sheet for Patients:   bloggercourse.com  Fact Sheet for Healthcare Providers:  seriousbroker.it  This test is no t yet approved or cleared by the [place]  FDA and  has been authorized for detection and/or diagnosis of SARS-CoV-2 by FDA under an Emergency Use Authorization (EUA). This EUA will remain  in effect (meaning this test can be used) for the duration of the COVID-19 declaration under Section 564(b)(1) of the Act, 21 U.S.C.section 360bbb-3(b)(1), unless the authorization is terminated  or revoked sooner.       Influenza A by PCR NEGATIVE NEGATIVE Final   Influenza B by PCR NEGATIVE NEGATIVE Final    Comment: (NOTE) The Xpert Xpress SARS-CoV-2/FLU/RSV plus assay is intended as an aid in the diagnosis of influenza from Nasopharyngeal swab specimens and should not be used as a sole basis for treatment. Nasal washings and aspirates are unacceptable for Xpert Xpress SARS-CoV-2/FLU/RSV testing.  Fact Sheet for Patients: bloggercourse.com  Fact Sheet for Healthcare Providers: seriousbroker.it  This test is not yet approved or cleared by the [place]  FDA and has been authorized for detection and/or diagnosis of SARS-CoV-2 by FDA under an Emergency Use Authorization (EUA). This EUA will remain in effect (meaning this test can be used) for the duration of the COVID-19 declaration under Section 564(b)(1) of the Act, 21 U.S.C. section 360bbb-3(b)(1), unless the authorization is terminated or revoked.     Resp Syncytial Virus by PCR NEGATIVE NEGATIVE Final    Comment: (NOTE) Fact Sheet for Patients: bloggercourse.com  Fact Sheet for Healthcare Providers: seriousbroker.it  This test is not yet approved or cleared by the [place]  FDA and has been authorized for detection and/or diagnosis of SARS-CoV-2 by FDA under an Emergency Use  Authorization (EUA). This EUA will remain in effect (meaning this test can be used) for the duration of the COVID-19 declaration under Section 564(b)(1) of the Act, 21 U.S.C. section 360bbb-3(b)(1), unless the authorization is terminated or revoked.  Performed at [facility], [address], [place],  [place] [address]   MRSA Next Gen by PCR, Nasal     Status: None   Collection Time: 10/28/23  8:23 AM   Specimen: Nasal Mucosa; Nasal Swab  Result Value Ref Range Status   MRSA by PCR Next Gen NOT DETECTED NOT DETECTED Final    Comment: (NOTE) The GeneXpert MRSA Assay (FDA approved for NASAL specimens only), is one component of a comprehensive MRSA colonization surveillance program. It is not intended to diagnose MRSA infection nor to guide or monitor treatment for MRSA infections. Test performance is not FDA approved in patients less than 29 years old. Performed at [facility], [address], [place], [place] [address]     Today   Subjective    Miguel Marsh today has no new complaints -Off BiPAP        -Oxygen  requirement is back to baseline -Ambulating without chest pains or palpitations or dizziness    Patient has been seen and examined prior to discharge   Objective   Blood pressure 122/71, pulse 64, temperature 98.9 F (37.2 C), temperature source Oral, resp. rate 17, height 5' 6 (1.676 m), weight 72.8 kg, SpO2 100%.   Intake/Output Summary (Last 24 hours) at 11/01/2023 1634 Last data filed at 11/01/2023 1300 Gross per 24 hour  Intake 640 ml  Output 500 ml  Net 140 ml    Exam Gen:- Awake Alert, no acute distress , no conversational dyspnea HEENT:- [place].AT, No sclera icterus Nose-[place] 3/L Neck-Supple Neck,No JVD,.  Lungs-improved air movement, no wheezing  CV- S1, S2 normal, regular Abd-  +ve B.Sounds, Abd Soft, No tenderness,    Extremity/Skin:- No  edema,   good pulses Psych-affect is appropriate, oriented x3 Neuro-generalized weakness, no new focal deficits,  no tremors    Data Review   CBC w Diff:  Lab Results  Component Value Date   WBC 6.5 10/27/2023   HGB 12.5 (L) 10/27/2023   HCT 40.4 10/27/2023   PLT 206 10/27/2023   LYMPHOPCT 44 10/27/2023   MONOPCT 11 10/27/2023   EOSPCT 4 10/27/2023   BASOPCT 1 10/27/2023   CMP:  Lab Results  Component Value Date   NA 139 10/29/2023   K 4.6 10/29/2023   CL 99 10/29/2023   CO2 33 (H) 10/29/2023   BUN 11 10/29/2023   CREATININE 0.58 (L) 10/29/2023   PROT 6.3 (L) 09/14/2023   ALBUMIN 3.3 (L) 09/14/2023   BILITOT 0.3 09/14/2023   ALKPHOS 64 09/14/2023   AST 14 (L) 09/14/2023   ALT 10 09/14/2023  .Total Discharge time is about 33 minutes  Rendall Carwin M.D on 11/01/2023 at 4:34 PM  Go to [email or web address] -  for contact info  [facility] - Office  [phone]

## 2023-11-01 NOTE — Discharge Instructions (Signed)
 1)you need oxygen  at home at 2 to 3 L via nasal cannula continuously while awake and while asleep--- smoking or having open fires around oxygen  can cause fire, significant injury and death  2) complete abstinence from tobacco and cocaine strongly advised  3)follow up with Renato Dorothey HERO, NP --primary care provider for recheck within a week

## 2023-11-01 NOTE — Progress Notes (Signed)
 This RN taking over patient care. Report received from RN Lauren.

## 2023-11-01 NOTE — Progress Notes (Signed)
 Patient c/o feeling dizzy , checked VS , Dr. Rendall is aware, patient states he feels a little anxious CBG was 97. Gave Prn oxycodone  and clonazepam  PO , will continue to monitor   11/01/23 1000  Vitals  BP (!) 140/78  MAP (mmHg) 92  BP Location Right Arm  Pulse Rate 62  MEWS COLOR  MEWS Score Color Green  Oxygen  Therapy  SpO2 100 %  O2 Device Nasal Cannula  O2 Flow Rate (L/min) 4 L/min  MEWS Score  MEWS Temp 0  MEWS Systolic 0  MEWS Pulse 0  MEWS RR 0  MEWS LOC 0  MEWS Score 0

## 2023-11-01 NOTE — Care Management Important Message (Signed)
 Important Message  Patient Details  Name: Miguel Marsh MRN: [id number] Date of Birth: 13-Oct-1961   Important Message Given:  Yes - Medicare IM     [name] 11/01/2023, 12:31 PM

## 2023-11-01 NOTE — Plan of Care (Signed)
   Problem: Education: Goal: Knowledge of General Education information will improve Description Including pain rating scale, medication(s)/side effects and non-pharmacologic comfort measures Outcome: Progressing

## 2023-11-01 NOTE — Plan of Care (Signed)
   Problem: Education: Goal: Knowledge of General Education information will improve Description Including pain rating scale, medication(s)/side effects and non-pharmacologic comfort measures Outcome: Progressing   Problem: Activity: Goal: Risk for activity intolerance will decrease Outcome: Progressing   Problem: Safety: Goal: Ability to remain free from injury will improve Outcome: Progressing

## 2023-11-01 NOTE — Progress Notes (Signed)
 Patient discharged home with all belongings & with our oxygen tank. Patient understands discharge instructions. [facility] phone number provided for when retuning oxygen tank.

## 2023-12-04 ENCOUNTER — Inpatient Hospital Stay (HOSPITAL_COMMUNITY)
Admission: EM | Admit: 2023-12-04 | Discharge: 2023-12-08 | DRG: 191 | Disposition: A | Discharge status: Home / Self Care | Payer: 59 | Attending: Internal Medicine | Admitting: Internal Medicine

## 2023-12-04 ENCOUNTER — Encounter (HOSPITAL_COMMUNITY): Payer: Self-pay

## 2023-12-04 ENCOUNTER — Other Ambulatory Visit: Payer: Self-pay

## 2023-12-04 ENCOUNTER — Emergency Department (HOSPITAL_COMMUNITY): Payer: 59

## 2023-12-04 DIAGNOSIS — J439 Emphysema, unspecified: Secondary | ICD-10-CM | POA: Diagnosis present

## 2023-12-04 DIAGNOSIS — Z96649 Presence of unspecified artificial hip joint: Secondary | ICD-10-CM | POA: Diagnosis present

## 2023-12-04 DIAGNOSIS — D638 Anemia in other chronic diseases classified elsewhere: Secondary | ICD-10-CM | POA: Diagnosis present

## 2023-12-04 DIAGNOSIS — C3491 Malignant neoplasm of unspecified part of right bronchus or lung: Secondary | ICD-10-CM | POA: Diagnosis present

## 2023-12-04 DIAGNOSIS — J441 Chronic obstructive pulmonary disease with (acute) exacerbation: Principal | ICD-10-CM | POA: Diagnosis present

## 2023-12-04 DIAGNOSIS — J9611 Chronic respiratory failure with hypoxia: Secondary | ICD-10-CM | POA: Diagnosis not present

## 2023-12-04 DIAGNOSIS — F1410 Cocaine abuse, uncomplicated: Secondary | ICD-10-CM | POA: Diagnosis present

## 2023-12-04 DIAGNOSIS — Z9981 Dependence on supplemental oxygen: Secondary | ICD-10-CM

## 2023-12-04 DIAGNOSIS — R0602 Shortness of breath: Secondary | ICD-10-CM | POA: Diagnosis not present

## 2023-12-04 DIAGNOSIS — Z79899 Other long term (current) drug therapy: Secondary | ICD-10-CM

## 2023-12-04 DIAGNOSIS — I10 Essential (primary) hypertension: Secondary | ICD-10-CM | POA: Diagnosis not present

## 2023-12-04 DIAGNOSIS — Z87891 Personal history of nicotine dependence: Secondary | ICD-10-CM

## 2023-12-04 DIAGNOSIS — Z1152 Encounter for screening for COVID-19: Secondary | ICD-10-CM

## 2023-12-04 DIAGNOSIS — E872 Acidosis, unspecified: Secondary | ICD-10-CM | POA: Diagnosis present

## 2023-12-04 DIAGNOSIS — J9612 Chronic respiratory failure with hypercapnia: Secondary | ICD-10-CM | POA: Diagnosis present

## 2023-12-04 DIAGNOSIS — Z886 Allergy status to analgesic agent status: Secondary | ICD-10-CM

## 2023-12-04 DIAGNOSIS — Z8249 Family history of ischemic heart disease and other diseases of the circulatory system: Secondary | ICD-10-CM

## 2023-12-04 DIAGNOSIS — Z7951 Long term (current) use of inhaled steroids: Secondary | ICD-10-CM

## 2023-12-04 LAB — CBC WITH DIFFERENTIAL/PLATELET
Abs Immature Granulocytes: 0.02 10*3/uL (ref 0.00–0.07)
Basophils Absolute: 0 10*3/uL (ref 0.0–0.1)
Basophils Relative: 1 %
Eosinophils Absolute: 0.1 10*3/uL (ref 0.0–0.5)
Eosinophils Relative: 2 %
HCT: 37.9 % — ABNORMAL LOW (ref 39.0–52.0)
Hemoglobin: 12.2 g/dL — ABNORMAL LOW (ref 13.0–17.0)
Immature Granulocytes: 0 %
Lymphocytes Relative: 29 %
Lymphs Abs: 1.9 10*3/uL (ref 0.7–4.0)
MCH: 30.4 pg (ref 26.0–34.0)
MCHC: 32.2 g/dL (ref 30.0–36.0)
MCV: 94.5 fL (ref 80.0–100.0)
Monocytes Absolute: 0.7 10*3/uL (ref 0.1–1.0)
Monocytes Relative: 11 %
Neutro Abs: 3.8 10*3/uL (ref 1.7–7.7)
Neutrophils Relative %: 57 %
Platelets: 225 10*3/uL (ref 150–400)
RBC: 4.01 MIL/uL — ABNORMAL LOW (ref 4.22–5.81)
RDW: 12 % (ref 11.5–15.5)
WBC: 6.5 10*3/uL (ref 4.0–10.5)
nRBC: 0 % (ref 0.0–0.2)

## 2023-12-04 LAB — COMPREHENSIVE METABOLIC PANEL
ALT: 11 U/L (ref 0–44)
AST: 13 U/L — ABNORMAL LOW (ref 15–41)
Albumin: 3.5 g/dL (ref 3.5–5.0)
Alkaline Phosphatase: 62 U/L (ref 38–126)
Anion gap: 8 (ref 5–15)
BUN: 6 mg/dL — ABNORMAL LOW (ref 8–23)
CO2: 36 mmol/L — ABNORMAL HIGH (ref 22–32)
Calcium: 8.9 mg/dL (ref 8.9–10.3)
Chloride: 95 mmol/L — ABNORMAL LOW (ref 98–111)
Creatinine, Ser: 0.55 mg/dL — ABNORMAL LOW (ref 0.61–1.24)
GFR, Estimated: >60 mL/min (ref >60)
Glucose, Bld: 96 mg/dL (ref 70–99)
Potassium: 3.6 mmol/L (ref 3.5–5.1)
Sodium: 139 mmol/L (ref 135–145)
Total Bilirubin: 0.8 mg/dL (ref 0.0–1.2)
Total Protein: 6.5 g/dL (ref 6.5–8.1)

## 2023-12-04 LAB — TROPONIN I (HIGH SENSITIVITY)
Troponin I (High Sensitivity): 7 ng/L (ref <18)
Troponin I (High Sensitivity): 5 ng/L (ref <18)

## 2023-12-04 LAB — BRAIN NATRIURETIC PEPTIDE: B Natriuretic Peptide: 38 pg/mL (ref 0.0–100.0)

## 2023-12-04 LAB — LACTIC ACID, PLASMA
Lactic Acid, Venous: 0.8 mmol/L (ref 0.5–1.9)
Lactic Acid, Venous: 2.3 mmol/L (ref 0.5–1.9)
Lactic Acid, Venous: 1.9 mmol/L (ref 0.5–1.9)

## 2023-12-04 LAB — RESP PANEL BY RT-PCR (RSV, FLU A&B, COVID)  RVPGX2
Influenza A by PCR: NEGATIVE
Influenza B by PCR: NEGATIVE
Resp Syncytial Virus by PCR: NEGATIVE
SARS Coronavirus 2 by RT PCR: NEGATIVE

## 2023-12-04 MED ORDER — POLYETHYLENE GLYCOL 3350 17 G PO PACK: 17.0000 g | PACK | Freq: Every day | ORAL | Status: DC | PRN

## 2023-12-04 MED ORDER — ONDANSETRON HCL 4 MG PO TABS: 4.0000 mg | ORAL_TABLET | Freq: Four times a day (QID) | ORAL | Status: DC | PRN

## 2023-12-04 MED ORDER — MOMETASONE FURO-FORMOTEROL FUM 100-5 MCG/ACT IN AERO
2.0000 | INHALATION_SPRAY | Freq: Two times a day (BID) | RESPIRATORY_TRACT | Status: DC
  Administered 2023-12-05 – 2023-12-08 (×7): 2 via RESPIRATORY_TRACT
  Filled 2023-12-04: qty 8.8

## 2023-12-04 MED ORDER — BUDESON-GLYCOPYRROL-FORMOTEROL 160-9-4.8 MCG/ACT IN AERO
2.0000 | INHALATION_SPRAY | Freq: Two times a day (BID) | RESPIRATORY_TRACT | Status: DC
Start: 2023-12-04 — End: 2023-12-04

## 2023-12-04 MED ORDER — UMECLIDINIUM BROMIDE 62.5 MCG/ACT IN AEPB
1.0000 | INHALATION_SPRAY | Freq: Every day | RESPIRATORY_TRACT | Status: DC
  Administered 2023-12-05 – 2023-12-08 (×4): 1 via RESPIRATORY_TRACT
  Filled 2023-12-04: qty 7

## 2023-12-04 MED ORDER — ENOXAPARIN SODIUM 40 MG/0.4ML IJ SOSY
40.0000 mg | PREFILLED_SYRINGE | INTRAMUSCULAR | Status: DC
Start: 2023-12-05 — End: 2023-12-08
  Administered 2023-12-05 – 2023-12-08 (×4): 40 mg via SUBCUTANEOUS
  Filled 2023-12-04 (×4): qty 0.4

## 2023-12-04 MED ORDER — SODIUM CHLORIDE 0.9 % IV SOLN
500.0000 mg | INTRAVENOUS | Status: AC
  Administered 2023-12-04: 500 mg via INTRAVENOUS
  Filled 2023-12-04: qty 5

## 2023-12-04 MED ORDER — AZITHROMYCIN 250 MG PO TABS
500.0000 mg | ORAL_TABLET | Freq: Every day | ORAL | Status: AC
  Administered 2023-12-05 – 2023-12-08 (×4): 500 mg via ORAL
  Filled 2023-12-04 (×4): qty 2

## 2023-12-04 MED ORDER — IPRATROPIUM-ALBUTEROL 0.5-2.5 (3) MG/3ML IN SOLN
3.0000 mL | Freq: Once | RESPIRATORY_TRACT | Status: AC
  Administered 2023-12-04: 3 mL via RESPIRATORY_TRACT
  Filled 2023-12-04: qty 3

## 2023-12-04 MED ORDER — GUAIFENESIN-DM 100-10 MG/5ML PO SYRP
15.0000 mL | ORAL_SOLUTION | Freq: Three times a day (TID) | ORAL | Status: AC
  Administered 2023-12-05: 15 mL via ORAL
  Filled 2023-12-04: qty 15

## 2023-12-04 MED ORDER — METHYLPREDNISOLONE SODIUM SUCC 125 MG IJ SOLR
125.0000 mg | Freq: Once | INTRAMUSCULAR | Status: AC
  Administered 2023-12-04: 125 mg via INTRAVENOUS
  Filled 2023-12-04: qty 2

## 2023-12-04 MED ORDER — IOHEXOL 350 MG/ML SOLN
75.0000 mL | Freq: Once | INTRAVENOUS | Status: AC | PRN
  Administered 2023-12-04: 75 mL via INTRAVENOUS

## 2023-12-04 MED ORDER — ONDANSETRON HCL 4 MG/2ML IJ SOLN: 4.0000 mg | Freq: Four times a day (QID) | INTRAMUSCULAR | Status: DC | PRN

## 2023-12-04 MED ORDER — ALBUTEROL SULFATE (2.5 MG/3ML) 0.083% IN NEBU
INHALATION_SOLUTION | RESPIRATORY_TRACT | Status: AC
  Filled 2023-12-04: qty 12

## 2023-12-04 MED ORDER — PREDNISONE 10 MG PO TABS: 20.0000 mg | ORAL_TABLET | Freq: Every day | ORAL | 0 refills | Status: DC

## 2023-12-04 MED ORDER — ACETAMINOPHEN 650 MG RE SUPP: 650.0000 mg | Freq: Four times a day (QID) | RECTAL | Status: DC | PRN

## 2023-12-04 MED ORDER — LACTATED RINGERS IV BOLUS
500.0000 mL | Freq: Once | INTRAVENOUS | Status: AC
  Administered 2023-12-04: 500 mL via INTRAVENOUS

## 2023-12-04 MED ORDER — ALBUTEROL SULFATE (2.5 MG/3ML) 0.083% IN NEBU
10.0000 mg/h | INHALATION_SOLUTION | RESPIRATORY_TRACT | Status: DC
  Administered 2023-12-04: 10 mg/h via RESPIRATORY_TRACT

## 2023-12-04 MED ORDER — METHYLPREDNISOLONE SODIUM SUCC 125 MG IJ SOLR: 60.0000 mg | Freq: Two times a day (BID) | INTRAMUSCULAR | Status: DC

## 2023-12-04 MED ORDER — CLONAZEPAM 0.5 MG PO TABS
1.0000 mg | ORAL_TABLET | Freq: Two times a day (BID) | ORAL | Status: DC | PRN
  Administered 2023-12-05 – 2023-12-07 (×2): 1 mg via ORAL
  Filled 2023-12-04 (×2): qty 2

## 2023-12-04 MED ORDER — PREDNISONE 20 MG PO TABS
40.0000 mg | ORAL_TABLET | Freq: Every day | ORAL | Status: DC
Start: 2023-12-06 — End: 2023-12-10

## 2023-12-04 MED ORDER — ACETAMINOPHEN 325 MG PO TABS: 650.0000 mg | ORAL_TABLET | Freq: Four times a day (QID) | ORAL | Status: DC | PRN

## 2023-12-04 NOTE — Assessment & Plan Note (Signed)
Presenting with dyspnea, productive cough, reduced air entry bilaterally.  Multiple prior admissions for COPD exacerbation. -IV Solu-Medrol 125 mg x 1, continue 60 twice daily - IV Azithromycin -Mucolytics -DuoNebs as needed and scheduled

## 2023-12-04 NOTE — ED Provider Notes (Signed)
 [place] EMERGENCY DEPARTMENT AT [facility] Provider Note   CSN: [id number] Arrival date & time: 12/04/23  1425     History  Chief Complaint  Patient presents with   Shortness of Breath    Miguel Marsh is a 63 y.o. male history of lung cancer newly on radiation that began last Friday, COPD, on 4 L nasal cannula baseline presented with shortness of breath.  Patient is on 4 L but denies any coughing or chest pain.  Patient states he feels acutely short of breath but denies fevers.  Patient is unsure of sick contacts.  Patient states he does not feel fluid overloaded and is not of history of CHF.  Patient received breathing treatments along with steroids from EMS.  Home Medications Prior to Admission medications   Medication Sig Start Date End Date Taking? Authorizing Provider  albuterol  (PROVENTIL ) (2.5 MG/3ML) 0.083% nebulizer solution Inhale 3 mLs (2.5 mg total) into the lungs every 6 (six) hours as needed for wheezing or shortness of breath. 11/01/23  Yes Emokpae, Courage, MD  albuterol  (VENTOLIN  HFA) 108 (90 Base) MCG/ACT inhaler Inhale 2 puffs into the lungs every 6 (six) hours as needed. 11/01/23  Yes Emokpae, Courage, MD  BREZTRI  AEROSPHERE 160-9-4.8 MCG/ACT AERO Inhale 2 puffs into the lungs 2 (two) times daily. 11/01/23  Yes Emokpae, Courage, MD  clonazePAM  (KLONOPIN ) 1 MG tablet Take 1 tablet (1 mg total) by mouth 2 (two) times daily as needed for anxiety. 11/01/23  Yes Emokpae, Courage, MD  furosemide  (LASIX ) 40 MG tablet Take 0.5 tablets (20 mg total) by mouth See admin instructions. Take once a day  as needed for fluid/swelling 11/01/23  Yes Emokpae, Courage, MD  omeprazole  (PRILOSEC) 40 MG capsule Take 1 capsule (40 mg total) by mouth daily. 11/01/23  Yes Emokpae, Courage, MD  oxyCODONE  (ROXICODONE ) 15 MG immediate release tablet Take 15 mg by mouth every 4 (four) hours as needed for pain.   Yes [provider]  OXYGEN  Inhale 3 L into the lungs continuous.    Yes  [provider]      Allergies    Ibuprofen     Review of Systems   Review of Systems  Respiratory:  Positive for shortness of breath.     Physical Exam Updated Vital Signs BP (!) 122/96   Pulse 91   Temp 98 F (36.7 C) (Oral)   Resp 18   Ht 5' 7 (1.702 m)   Wt 74.8 kg   SpO2 92%   BMI 25.84 kg/m  Physical Exam Constitutional:      General: He is not in acute distress. Cardiovascular:     Rate and Rhythm: Normal rate and regular rhythm.     Pulses: Normal pulses.     Heart sounds: Normal heart sounds.  Pulmonary:     Effort: No respiratory distress.     Breath sounds: Wheezing present.     Comments: Able to speak in full sentences Increased work of breathing Musculoskeletal:     Right lower leg: No tenderness. No edema.     Left lower leg: No tenderness. No edema.  Skin:    General: Skin is warm and dry.  Neurological:     Mental Status: He is alert.  Psychiatric:        Mood and Affect: Mood normal.     ED Results / Procedures / Treatments   Labs (all labs ordered are listed, but only abnormal results are displayed) Labs Reviewed  LACTIC  ACID, PLASMA - Abnormal; Notable for the following components:      Result Value   Lactic Acid, Venous 2.3 (*)    All other components within normal limits  COMPREHENSIVE METABOLIC PANEL - Abnormal; Notable for the following components:   Chloride 95 (*)    CO2 36 (*)    BUN 6 (*)    Creatinine, Ser 0.55 (*)    AST 13 (*)    All other components within normal limits  CBC WITH DIFFERENTIAL/PLATELET - Abnormal; Notable for the following components:   RBC 4.01 (*)    Hemoglobin 12.2 (*)    HCT 37.9 (*)    All other components within normal limits  RESP PANEL BY RT-PCR (RSV, FLU A&B, COVID)  [id number]  LACTIC ACID, PLASMA  URINALYSIS, W/ REFLEX TO CULTURE (INFECTION SUSPECTED)  LACTIC ACID, PLASMA  BRAIN NATRIURETIC PEPTIDE  TROPONIN I (HIGH SENSITIVITY)  TROPONIN I (HIGH SENSITIVITY)    EKG EKG  Interpretation Date/Time:  Tuesday December 04 2023 14:31:42 EST Ventricular Rate:  91 PR Interval:  132 QRS Duration:  84 QT Interval:  349 QTC Calculation: 435 R Axis:   -3  Text Interpretation: Sinus rhythm Minimal ST elevation, inferior leads Confirmed by Cleotilde Rogue ([id number]) on 12/04/2023 6:38:46 PM  Radiology CT Angio Chest PE W/Cm &/Or Wo Cm Result Date: 12/04/2023 CLINICAL DATA:  Respiratory distress.  History of lung cancer. EXAM: CT ANGIOGRAPHY CHEST WITH CONTRAST TECHNIQUE: Multidetector CT imaging of the chest was performed using the standard protocol during bolus administration of intravenous contrast. Multiplanar CT image reconstructions and MIPs were obtained to evaluate the vascular anatomy. RADIATION DOSE REDUCTION: This exam was performed according to the departmental dose-optimization program which includes automated exposure control, adjustment of the mA and/or kV according to patient size and/or use of iterative reconstruction technique. CONTRAST:  75mL OMNIPAQUE  IOHEXOL  350 MG/ML SOLN COMPARISON:  Chest x-ray from same day. CT chest dated September 13, 2023. FINDINGS: Cardiovascular: Suboptimal opacification of the pulmonary arteries to the segmental level. No evidence of pulmonary embolism. Normal heart size. No pericardial effusion. No thoracic aortic aneurysm or dissection. Coronary, aortic arch, and branch vessel atherosclerotic vascular disease. Mediastinum/Nodes: No enlarged mediastinal, hilar, or axillary lymph nodes. Thyroid gland, trachea, and esophagus demonstrate no significant findings. Lungs/Pleura: New slightly expansile area of mucous airways impaction in the medial left lower lobe (series 10, image 114). Irregular, branching right lower lobe nodule is slightly increased in size in the interval, currently measuring approximately 2.1 x 2.0 cm (series 10, image 113), previously 1.9 x 1.9 cm. 8 mm ground-glass nodule in the anterior right upper lobe is not significantly  changed (series 10, image 49). Similar bronchial wall thickening and cylindrical bronchiectasis in both lungs. No focal consolidation, pleural effusion, or pneumothorax. Mild centrilobular emphysema again noted. Upper Abdomen: No acute abnormality. Musculoskeletal: No chest wall abnormality. No acute or significant osseous findings. Review of the MIP images confirms the above findings. IMPRESSION: 1. Suboptimal study.  No evidence of pulmonary embolism. 2. Irregular, branching right lower lobe nodule is slightly increased in size in the interval, currently measuring approximately 2.1 x 2.0 cm. This remains concerning for malignancy. 3. Aortic Atherosclerosis (ICD10-I70.0) and Emphysema (ICD10-J43.9). Electronically Signed   By: Elsie ONEIDA Shoulder M.D.   On: 12/04/2023 17:03   DG Chest 2 View Result Date: 12/04/2023 CLINICAL DATA:  Shortness of breath. EXAM: CHEST - 2 VIEW COMPARISON:  10/27/2023. FINDINGS: Bilateral lung fields are clear. Bilateral costophrenic angles are clear. Normal cardio-mediastinal silhouette.  There is a small retrocardiac hiatal hernia. No acute osseous abnormalities. The soft tissues are within normal limits. IMPRESSION: No active cardiopulmonary disease. Electronically Signed   By: Ree Molt M.D.   On: 12/04/2023 16:13    Procedures Procedures    Medications Ordered in ED Medications  albuterol  (PROVENTIL ) (2.5 MG/3ML) 0.083% nebulizer solution (10 mg/hr Nebulization New Bag/Given 12/04/23 1606)  albuterol  (PROVENTIL ) (2.5 MG/3ML) 0.083% nebulizer solution (  Not Given 12/04/23 1608)  ipratropium-albuterol  (DUONEB) 0.5-2.5 (3) MG/3ML nebulizer solution 3 mL (3 mLs Nebulization Given 12/04/23 1520)  iohexol  (OMNIPAQUE ) 350 MG/ML injection 75 mL (75 mLs Intravenous Contrast Given 12/04/23 1548)  lactated ringers  bolus 500 mL (500 mLs Intravenous New Bag/Given 12/04/23 1816)    ED Course/ Medical Decision Making/ A&P                                 Medical Decision Making Amount  and/or Complexity of Data Reviewed Labs: ordered. Radiology: ordered.  Risk Prescription drug management.   Miguel Marsh 63 y.o. presented today for shortness of breath.  Working DDx that I considered at this time includes, but not limited to, asthma/COPD exacerbation, URI, viral illness, anemia, ACS, PE, pneumonia, pleural effusion, lung cancer, CHF, respiratory distress, medication side effect, intoxication.  R/o DDx: asthma/COPD exacerbation, URI, viral illness, anemia, ACS, PE, pneumonia, pleural effusion, CHF, respiratory distress, medication side effect, intoxication: These are considered less likely due to history of present illness, physical exam, labs/imaging findings  Review of prior external notes: 11/30/2023 office visit  Unique Tests and My Independent Interpretation:  CBC: Unremarkable CMP: Unremarkable, around baseline EKG: Sinus 91 bpm, no ST elevations or depressions noted Troponin: 7 Lactic acid: 0.8, 2.3 CXR: No acute findings CTA Chest PE: No acute findings Respiratory Panel: Negative  Social Determinants of Health: none  Discussion with Independent Historian:  EMS  Discussion of Management of Tests: None  Risk: Medium: prescription drug management  Risk Stratification Score: None  Plan: On exam patient was in no acute distress but appeared uncomfortable on exam on 4 L nasal cannula which is his baseline. Physical exam showed wheezing in the left lung. The cardiac monitor was ordered secondary to the patient's history of shortness of breath and to monitor the patient for dysrhythmia. Cardiac monitor by my independent interpretation showed normal sinus.  Will obtain labs and imaging.  Patient is a lung cancer patient and given that he feels acutely short of breath we will get CTA to rule out PE.  Will give breathing treatment due to wheezing found on exam.  Chest x-ray appears to have right basilar opacity however still waiting for radiology read.  Chest  x-ray read by radiology does not show any acute findings.  Reevaluation of patient's cardiac monitor by myself does show normal sinus.  Patient states he is feeling better after receiving a breathing treatment.  CT scan was negative.  Will get delta troponin and reevaluate.  Went to reevaluate the patient and patient was at his baseline feeling comfortable.  CT scan was negative for any acute findings.  Patient is currently at his baseline of oxygen  and we had shared decision making patient states he feels comfortable going home as his labs and imaging are reassuring pending delta troponin.  Do feel patient's shortness of breath may be related to his lung cancer and trying recommend he follows up with his primary care provider and oncologist.  Patient's  repeat lactic acid did come back elevated 2.3.  Will give fluids and recheck.  Patient signed out to [place], [place].  Please review their note for the continuation of patient's care.  The plan at this point is recheck lactic acid and if downtrending can discharge.  This chart was dictated using voice recognition software.  Despite best efforts to proofread,  errors can occur which can change the documentation meaning.         Final Clinical Impression(s) / ED Diagnoses Final diagnoses:  None    Rx / DC Orders ED Discharge Orders     None         Victor Lynwood ONEIDA DEVONNA 12/04/23 1841    Yolande Lamar BROCKS, MD 12/06/23 [phone]

## 2023-12-04 NOTE — Assessment & Plan Note (Signed)
Currently follows at [facility].  Currently on radiotherapy, last radiotherapy was yesterday he was unable to make today's appointment due to illness.

## 2023-12-04 NOTE — ED Provider Notes (Signed)
   Patient signed out to me by Lynwood Mas, PA-C pending reassessment and repeat lactic acid.  Patient with history of COPD initially diagnosed with non-small cell lung cancer 2016, recurrence in August 2024 in right lung on 4 L O2 nasal cannula at baseline.  He sees oncology at [facility].  Had an appointment today but did not feel like going.  Continues to have shortness of breath despite supplemental oxygen .  He was given multiple nebs here.  Workup included labs and CT angio of the chest that was negative for PE.  See previous provider note for complete H&P  His initial lactic acid was elevated at 2.3.  Patient was given IV fluids and repeat lactic acid improved at 1.9 but patient continues to feel short of breath.  Despite DuoNeb and continuous albuterol  treatment here.  He was given Solu-Medrol  by EMS and route  On my exam, patient continues to have pursed lip breathing.  Few scattered expiratory wheezes remain.  He was initially feeling better after his continuous neb but now his shortness of breath has returned and he is slightly tachypneic.  O2 sat at 92% on 3 L. likely COPD exacerbation.  Feel that he will need hospital admission  Discussed with [facility] hospitalist, Dr. FORBES Carwin who agrees to admit   Herlinda Milling, PA-C 12/04/23 2121    Cleotilde Rogue, MD 12/05/23 [phone]

## 2023-12-04 NOTE — ED Triage Notes (Signed)
Pt presents to ED via [facility] for increased SOB. Hx lung cancer. Pt given Duoneb PTA, 5 albuterol and 1 atrovent, solumedrol 125 mg, 18g R AC PTA. VS BP 172/80, P 91, RR 20, O2 100% on 8L. Pt normally wears 4L.

## 2023-12-04 NOTE — Assessment & Plan Note (Addendum)
Systolic 120s to 161W. -Not on antihypertensives.  On as needed Lasix 20 mg

## 2023-12-04 NOTE — H&P (Signed)
 History and Physical    Miguel Marsh:[id number] DOB: 06/03/1961 DOA: 12/04/2023  PCP: Miguel Dorothey HERO, NP   Patient coming from: Home  I have personally briefly reviewed patient's old medical records in [facility]  Chief Complaint: Difficulty breathing  HPI: Miguel Marsh is a 63 y.o. male with medical history significant for COPD, respiratory failure on 3 L, non-small cell lung cancer , hypertension. Patient presented to the ED with complaints of difficulty breathing worsening over the past week, with worsening productive cough.  No chest pain.  No lower extremity swelling. Currently receiving radiation treatments for lung cancer at [facility].  ED Course: Temperature 98.5.  Heart rate 80s to 90s.  Respiratory rate 16-23.  Blood pressure systolic 120s to 849d.  O2 sats 91 to 97 percent on 2 L nasal cannula. CTA-suboptimal study, no PE, irregular right lower lobe nodule concerning for malignancy.  Emphysema. DuoNebs, given initial improvement in breathing, with plans to discharge, but with recurrence of difficulty breathing hospitalist was consulted for admission.  500 mL bolus given.  Review of Systems: As per HPI all other systems reviewed and negative.  Past Medical History:  Diagnosis Date   Arthritis    Bronchitis    Cancer (HCC)    metastatic NSCLC (followed by [facility])   COPD (chronic obstructive pulmonary disease) (HCC)    HTN (hypertension)     Past Surgical History:  Procedure Laterality Date   LUNG BIOPSY     TOTAL HIP ARTHROPLASTY     TUMOR REMOVAL Right 12/04/2017     reports that he has quit smoking. His smoking use included cigarettes. He has never used smokeless tobacco. He reports current alcohol  use. He reports current drug use. Drugs: Marijuana and Cocaine.  Allergies  Allergen Reactions   Ibuprofen  Shortness Of Breath    Family history of hypertension  Prior to Admission medications   Medication Sig Start Date End Date Taking?  Authorizing Provider  albuterol  (PROVENTIL ) (2.5 MG/3ML) 0.083% nebulizer solution Inhale 3 mLs (2.5 mg total) into the lungs every 6 (six) hours as needed for wheezing or shortness of breath. 11/01/23  Yes Arizbeth Marsh, Courage, MD  albuterol  (VENTOLIN  HFA) 108 (90 Base) MCG/ACT inhaler Inhale 2 puffs into the lungs every 6 (six) hours as needed. 11/01/23  Yes Miguel Marsh, Courage, MD  BREZTRI  AEROSPHERE 160-9-4.8 MCG/ACT AERO Inhale 2 puffs into the lungs 2 (two) times daily. 11/01/23  Yes Miguel Marsh, Courage, MD  clonazePAM  (KLONOPIN ) 1 MG tablet Take 1 tablet (1 mg total) by mouth 2 (two) times daily as needed for anxiety. 11/01/23  Yes Miguel Marsh, Courage, MD  furosemide  (LASIX ) 40 MG tablet Take 0.5 tablets (20 mg total) by mouth See admin instructions. Take once a day  as needed for fluid/swelling 11/01/23  Yes Miguel Marsh, Courage, MD  omeprazole  (PRILOSEC) 40 MG capsule Take 1 capsule (40 mg total) by mouth daily. 11/01/23  Yes Miguel Marsh, Courage, MD  oxyCODONE  (ROXICODONE ) 15 MG immediate release tablet Take 15 mg by mouth every 4 (four) hours as needed for pain.   Yes [provider]  OXYGEN  Inhale 3 L into the lungs continuous.    Yes [provider]  predniSONE  (DELTASONE ) 10 MG tablet Take 2 tablets (20 mg total) by mouth daily for 4 days. 12/04/23 12/08/23 Yes Miguel Lynwood DASEN, PA-C    Physical Exam: Vitals:   12/04/23 1830 12/04/23 1845 12/04/23 1930 12/04/23 2030  BP: (!) 122/96 137/71 (!) 144/79 (!) 147/80  Pulse: 91 91 90  80  Resp: 18 (!) 21 (!) 23 20  Temp: 98 F (36.7 C)     TempSrc: Oral     SpO2: 92% 92% 93% 95%  Weight:      Height:        Constitutional: NAD, calm, comfortable Vitals:   12/04/23 1830 12/04/23 1845 12/04/23 1930 12/04/23 2030  BP: (!) 122/96 137/71 (!) 144/79 (!) 147/80  Pulse: 91 91 90 80  Resp: 18 (!) 21 (!) 23 20  Temp: 98 F (36.7 C)     TempSrc: Oral     SpO2: 92% 92% 93% 95%  Weight:      Height:       Eyes: PERRL, lids and conjunctivae normal ENMT:  Mucous membranes are moist. Neck: normal, supple, no masses, no thyromegaly Respiratory: Reduced breath sounds bilaterally, pursed breathing this is normal for patient, prolonged expiratory phase.  Cardiovascular: Regular rate and rhythm, no murmurs / rubs / gallops. No extremity edema.  Extremities warm.   Abdomen: no tenderness, no masses palpated. No hepatosplenomegaly. Bowel sounds positive.  Musculoskeletal: no clubbing / cyanosis. No joint deformity upper and lower extremities.  Skin: no rashes, lesions, ulcers. No induration Neurologic: No facial asymmetry, moving extremities spontaneously, speech fluent.  Psychiatric: Normal judgment and insight. Alert and oriented x 3. Normal mood.   Labs on Admission: I have personally reviewed following labs and imaging studies  CBC: Recent Labs  Lab 12/04/23 1448  WBC 6.5  NEUTROABS 3.8  HGB 12.2*  HCT 37.9*  MCV 94.5  PLT 225   Basic Metabolic Panel: Recent Labs  Lab 12/04/23 1448  NA 139  K 3.6  CL 95*  CO2 36*  GLUCOSE 96  BUN 6*  CREATININE 0.55*  CALCIUM 8.9   GFR: Estimated Creatinine Clearance: 89.5 mL/min (A) (by C-G formula based on SCr of 0.55 mg/dL (L)). Liver Function Tests: Recent Labs  Lab 12/04/23 1448  AST 13*  ALT 11  ALKPHOS 62  BILITOT 0.8  PROT 6.5  ALBUMIN 3.5   Radiological Exams on Admission: CT Angio Chest PE W/Cm &/Or Wo Cm Result Date: 12/04/2023 CLINICAL DATA:  Respiratory distress.  History of lung cancer. EXAM: CT ANGIOGRAPHY CHEST WITH CONTRAST TECHNIQUE: Multidetector CT imaging of the chest was performed using the standard protocol during bolus administration of intravenous contrast. Multiplanar CT image reconstructions and MIPs were obtained to evaluate the vascular anatomy. RADIATION DOSE REDUCTION: This exam was performed according to the departmental dose-optimization program which includes automated exposure control, adjustment of the mA and/or kV according to patient size and/or use  of iterative reconstruction technique. CONTRAST:  75mL OMNIPAQUE  IOHEXOL  350 MG/ML SOLN COMPARISON:  Chest x-ray from same day. CT chest dated September 13, 2023. FINDINGS: Cardiovascular: Suboptimal opacification of the pulmonary arteries to the segmental level. No evidence of pulmonary embolism. Normal heart size. No pericardial effusion. No thoracic aortic aneurysm or dissection. Coronary, aortic arch, and branch vessel atherosclerotic vascular disease. Mediastinum/Nodes: No enlarged mediastinal, hilar, or axillary lymph nodes. Thyroid gland, trachea, and esophagus demonstrate no significant findings. Lungs/Pleura: New slightly expansile area of mucous airways impaction in the medial left lower lobe (series 10, image 114). Irregular, branching right lower lobe nodule is slightly increased in size in the interval, currently measuring approximately 2.1 x 2.0 cm (series 10, image 113), previously 1.9 x 1.9 cm. 8 mm ground-glass nodule in the anterior right upper lobe is not significantly changed (series 10, image 49). Similar bronchial wall thickening and cylindrical bronchiectasis in both  lungs. No focal consolidation, pleural effusion, or pneumothorax. Mild centrilobular emphysema again noted. Upper Abdomen: No acute abnormality. Musculoskeletal: No chest wall abnormality. No acute or significant osseous findings. Review of the MIP images confirms the above findings. IMPRESSION: 1. Suboptimal study.  No evidence of pulmonary embolism. 2. Irregular, branching right lower lobe nodule is slightly increased in size in the interval, currently measuring approximately 2.1 x 2.0 cm. This remains concerning for malignancy. 3. Aortic Atherosclerosis (ICD10-I70.0) and Emphysema (ICD10-J43.9). Electronically Signed   By: Elsie ONEIDA Shoulder M.D.   On: 12/04/2023 17:03   DG Chest 2 View Result Date: 12/04/2023 CLINICAL DATA:  Shortness of breath. EXAM: CHEST - 2 VIEW COMPARISON:  10/27/2023. FINDINGS: Bilateral lung fields are  clear. Bilateral costophrenic angles are clear. Normal cardio-mediastinal silhouette. There is a small retrocardiac hiatal hernia. No acute osseous abnormalities. The soft tissues are within normal limits. IMPRESSION: No active cardiopulmonary disease. Electronically Signed   By: Ree Molt M.D.   On: 12/04/2023 16:13    EKG: Independently reviewed.  Sinus rhythm rate 91, QTc 435.  No significant change from prior.  Assessment/Plan Principal Problem:   COPD exacerbation (HCC) Active Problems:   Chronic respiratory failure with hypoxia and hypercapnia (HCC)   Essential hypertension   Non-small cell cancer of right lung (in remission)   Cocaine abuse (HCC)   Assessment and Plan: * COPD exacerbation (HCC) Presenting with dyspnea, productive cough, reduced air entry bilaterally.  Multiple prior admissions for COPD exacerbation. -IV Solu-Medrol  125 mg x 1, continue 60 twice daily - IV Azithromycin  -Mucolytics -DuoNebs as needed and scheduled  Chronic respiratory failure with hypoxia and hypercapnia (HCC) Chronically on 3 L.  Currently on 2 L.  Sats currently 91 to 97%.  No increased O2 requirement  Essential hypertension Systolic 120s to 849d. -Not on antihypertensives.  On as needed Lasix  20 mg  Non-small cell cancer of right lung (in remission) Currently follows at [facility].  Currently on radiotherapy, last radiotherapy was yesterday he was unable to make today's appointment due to illness.   DVT prophylaxis: Lovenox  Code Status: FULL code-confirmed with patient at bedside Family Communication: None at bedside Disposition Plan: ~ 1- 2 days Consults called: None Admission status:  Obs tele    Author: Tully FORBES Carwin, MD 12/04/2023 11:11 PM  For on call review [email or web address].

## 2023-12-04 NOTE — ED Notes (Signed)
Gave pt a sandwich and cup of ice.

## 2023-12-04 NOTE — Discharge Instructions (Addendum)
 Please follow-up with your primary care provider and oncologist in regards recent ER visit.  Today your labs and imaging are reassuring you may have had an exacerbation of your lung cancer.  I have prescribed you steroids to take for the next 2 days to help with any shortness of breath he may have.  Please take medications as prescribed and if symptoms are to worsen please return to the ER.

## 2023-12-04 NOTE — Assessment & Plan Note (Addendum)
Chronically on 3 L.  Currently on 2 L.  Sats currently 91 to 97%.  No increased O2 requirement

## 2023-12-05 DIAGNOSIS — C3491 Malignant neoplasm of unspecified part of right bronchus or lung: Secondary | ICD-10-CM | POA: Diagnosis not present

## 2023-12-05 DIAGNOSIS — I10 Essential (primary) hypertension: Secondary | ICD-10-CM | POA: Diagnosis not present

## 2023-12-05 DIAGNOSIS — J441 Chronic obstructive pulmonary disease with (acute) exacerbation: Secondary | ICD-10-CM | POA: Diagnosis not present

## 2023-12-05 DIAGNOSIS — J9611 Chronic respiratory failure with hypoxia: Secondary | ICD-10-CM | POA: Diagnosis not present

## 2023-12-05 MED ORDER — METHYLPREDNISOLONE SODIUM SUCC 125 MG IJ SOLR
80.0000 mg | Freq: Two times a day (BID) | INTRAMUSCULAR | Status: DC
  Administered 2023-12-05: 80 mg via INTRAVENOUS
  Filled 2023-12-05 (×2): qty 2

## 2023-12-05 MED ORDER — TRAMADOL HCL 50 MG PO TABS: 50.0000 mg | ORAL_TABLET | Freq: Four times a day (QID) | ORAL | Status: DC | PRN

## 2023-12-05 MED ORDER — OXYCODONE HCL 5 MG PO TABS
15.0000 mg | ORAL_TABLET | ORAL | Status: DC | PRN
  Administered 2023-12-05 – 2023-12-07 (×5): 15 mg via ORAL
  Filled 2023-12-05 (×6): qty 3

## 2023-12-05 MED ORDER — PREDNISONE 20 MG PO TABS: 40.0000 mg | ORAL_TABLET | Freq: Every day | ORAL | Status: DC

## 2023-12-05 MED ORDER — METHYLPREDNISOLONE SODIUM SUCC 125 MG IJ SOLR
80.0000 mg | Freq: Two times a day (BID) | INTRAMUSCULAR | Status: DC
  Administered 2023-12-05: 80 mg via INTRAVENOUS
  Filled 2023-12-05: qty 2

## 2023-12-05 MED ORDER — IPRATROPIUM-ALBUTEROL 0.5-2.5 (3) MG/3ML IN SOLN
3.0000 mL | Freq: Four times a day (QID) | RESPIRATORY_TRACT | Status: DC
  Administered 2023-12-05 – 2023-12-08 (×13): 3 mL via RESPIRATORY_TRACT
  Filled 2023-12-05 (×13): qty 3

## 2023-12-05 MED ORDER — ALBUTEROL SULFATE (2.5 MG/3ML) 0.083% IN NEBU: 2.5000 mg | INHALATION_SOLUTION | RESPIRATORY_TRACT | Status: DC | PRN

## 2023-12-05 NOTE — Progress Notes (Signed)
 PROGRESS NOTE    Miguel Marsh  FMW:[id number] DOB: Jan 12, 1961 DOA: 12/04/2023 PCP: Renato Dorothey HERO, NP   Brief Narrative:  Patient is a 63 year old male with history of COPD, chronic respiratory failure on 3 L oxygen  by nasal cannula, non-small cell lung cancer, hypertension presented with worsening shortness of breath.  On presentation, CTA chest was suboptimal study, no PE, irregular right lower lobe nodule concerning for malignancy, emphysema.  He was started on IV Solu-Medrol  along with nebs.  Assessment & Plan:   COPD exacerbation Chronic respiratory with hypoxia -Imaging as above.  Respiratory status currently stable: Currently requiring 3 L oxygen  via nasal cannula which he normally wears at home -Feel slightly better.  Continue Solu-Medrol .  Continue current meds along with oral Zithromax .  COVID/influenza/RSV PCR negative on presentation  Non-small cell lung cancer of right lung, apparently in remission -Currently follows at [facility]. Currently on radiotherapy.  Outpatient follow-up with oncology  Essential hypertension -Blood pressure intermittently elevated.  Not on antihypertensives at home.  Continue as needed antihypertensives.  Lactic acidosis--possible from COPD exacerbation.  Resolved  Anemia of chronic disease -Possibly from chronic illnesses.  Hemoglobin stable.  Monitor intermittently  DVT prophylaxis: Lovenox  Code Status: Full Family Communication: None at bedside Disposition Plan: Status is: Observation The patient will require care spanning > 2 midnights and should be moved to inpatient because: Of severity of illness  Consultants: None  Procedures: None  Antimicrobials:  Anti-infectives (From admission, onward)    Start     Dose/Rate Route Frequency Ordered Stop   12/05/23 2315  azithromycin  (ZITHROMAX ) tablet 500 mg       Placed in Followed by Linked Group   500 mg Oral Daily 12/04/23 2311 12/09/23 0959   12/04/23 2315   azithromycin  (ZITHROMAX ) 500 mg in sodium chloride  0.9 % 250 mL IVPB       Placed in Followed by Linked Group   500 mg 250 mL/hr over 60 Minutes Intravenous Every 24 hours 12/04/23 2311 12/05/23 0024        Subjective: Patient seen and examined at bedside.  She slept better but still short breath with exertion.  No fever, vomiting, chest pain reported.  Objective: Vitals:   12/04/23 2230 12/04/23 2300 12/05/23 0100 12/05/23 0407  BP: (!) 147/90 (!) 141/103 (!) 151/87 (!) 156/86  Pulse: 88 88 86 68  Resp: 20 11 (!) 26 19  Temp:   98 F (36.7 C) 98.1 F (36.7 C)  TempSrc:    Oral  SpO2: 94% 95% 97% 96%  Weight:   73.8 kg   Height:   5' 7 (1.702 m)     Intake/Output Summary (Last 24 hours) at 12/05/2023 0658 Last data filed at 12/05/2023 0407 Gross per 24 hour  Intake --  Output 1000 ml  Net -1000 ml   Filed Weights   12/04/23 1431 12/05/23 0100  Weight: 74.8 kg 73.8 kg    Examination:  General exam: Appears calm and comfortable.  On 3 L oxygen  by nasal cannula.  Looks chronically ill and deconditioned. Respiratory system: Bilateral decreased breath sounds at bases with scattered crackles and wheezing Cardiovascular system: S1 & S2 heard, Rate controlled Gastrointestinal system: Abdomen is nondistended, soft and nontender. Normal bowel sounds heard. Extremities: No cyanosis, clubbing, edema  Central nervous system: Alert and oriented. No focal neurological deficits. Moving extremities Skin: No rashes, lesions or ulcers Psychiatry: Flat affect; not agitated.   Data Reviewed: I have personally reviewed following labs and imaging studies  CBC: Recent Labs  Lab 12/04/23 1448  WBC 6.5  NEUTROABS 3.8  HGB 12.2*  HCT 37.9*  MCV 94.5  PLT 225   Basic Metabolic Panel: Recent Labs  Lab 12/04/23 1448  NA 139  K 3.6  CL 95*  CO2 36*  GLUCOSE 96  BUN 6*  CREATININE 0.55*  CALCIUM 8.9   GFR: Estimated Creatinine Clearance: 89.5 mL/min (A) (by C-G formula  based on SCr of 0.55 mg/dL (L)). Liver Function Tests: Recent Labs  Lab 12/04/23 1448  AST 13*  ALT 11  ALKPHOS 62  BILITOT 0.8  PROT 6.5  ALBUMIN 3.5   No results for input(s): LIPASE, AMYLASE in the last 168 hours. No results for input(s): AMMONIA in the last 168 hours. Coagulation Profile: No results for input(s): INR, PROTIME in the last 168 hours. Cardiac Enzymes: No results for input(s): CKTOTAL, CKMB, CKMBINDEX, TROPONINI in the last 168 hours. BNP (last 3 results) No results for input(s): PROBNP in the last 8760 hours. HbA1C: No results for input(s): HGBA1C in the last 72 hours. CBG: No results for input(s): GLUCAP in the last 168 hours. Lipid Profile: No results for input(s): CHOL, HDL, LDLCALC, TRIG, CHOLHDL, LDLDIRECT in the last 72 hours. Thyroid Function Tests: No results for input(s): TSH, T4TOTAL, FREET4, T3FREE, THYROIDAB in the last 72 hours. Anemia Panel: No results for input(s): VITAMINB12, FOLATE, FERRITIN, TIBC, IRON, RETICCTPCT in the last 72 hours. Sepsis Labs: Recent Labs  Lab 12/04/23 1435 12/04/23 1619 12/04/23 1911  LATICACIDVEN 0.8 2.3* 1.9    Recent Results (from the past 240 hours)  Resp panel by RT-PCR (RSV, Flu A&B, Covid) Anterior Nasal Swab     Status: None   Collection Time: 12/04/23  2:54 PM   Specimen: Anterior Nasal Swab  Result Value Ref Range Status   SARS Coronavirus 2 by RT PCR NEGATIVE NEGATIVE Final    Comment: (NOTE) SARS-CoV-2 target nucleic acids are NOT DETECTED.  The SARS-CoV-2 RNA is generally detectable in upper respiratory specimens during the acute phase of infection. The lowest concentration of SARS-CoV-2 viral copies this assay can detect is 138 copies/mL. A negative result does not preclude SARS-Cov-2 infection and should not be used as the sole basis for treatment or other patient management decisions. A negative result may occur with  improper  specimen collection/handling, submission of specimen other than nasopharyngeal swab, presence of viral mutation(s) within the areas targeted by this assay, and inadequate number of viral copies(<138 copies/mL). A negative result must be combined with clinical observations, patient history, and epidemiological information. The expected result is Negative.  Fact Sheet for Patients:  bloggercourse.com  Fact Sheet for Healthcare Providers:  seriousbroker.it  This test is no t yet approved or cleared by the [place]  FDA and  has been authorized for detection and/or diagnosis of SARS-CoV-2 by FDA under an Emergency Use Authorization (EUA). This EUA will remain  in effect (meaning this test can be used) for the duration of the COVID-19 declaration under Section 564(b)(1) of the Act, 21 U.S.C.section 360bbb-3(b)(1), unless the authorization is terminated  or revoked sooner.       Influenza A by PCR NEGATIVE NEGATIVE Final   Influenza B by PCR NEGATIVE NEGATIVE Final    Comment: (NOTE) The Xpert Xpress SARS-CoV-2/FLU/RSV plus assay is intended as an aid in the diagnosis of influenza from Nasopharyngeal swab specimens and should not be used as a sole basis for treatment. Nasal washings and aspirates are unacceptable for Xpert Xpress SARS-CoV-2/FLU/RSV testing.  Fact Sheet for Patients: bloggercourse.com  Fact Sheet for Healthcare Providers: seriousbroker.it  This test is not yet approved or cleared by the [place]  FDA and has been authorized for detection and/or diagnosis of SARS-CoV-2 by FDA under an Emergency Use Authorization (EUA). This EUA will remain in effect (meaning this test can be used) for the duration of the COVID-19 declaration under Section 564(b)(1) of the Act, 21 U.S.C. section 360bbb-3(b)(1), unless the authorization is terminated or revoked.     Resp  Syncytial Virus by PCR NEGATIVE NEGATIVE Final    Comment: (NOTE) Fact Sheet for Patients: bloggercourse.com  Fact Sheet for Healthcare Providers: seriousbroker.it  This test is not yet approved or cleared by the [place]  FDA and has been authorized for detection and/or diagnosis of SARS-CoV-2 by FDA under an Emergency Use Authorization (EUA). This EUA will remain in effect (meaning this test can be used) for the duration of the COVID-19 declaration under Section 564(b)(1) of the Act, 21 U.S.C. section 360bbb-3(b)(1), unless the authorization is terminated or revoked.  Performed at [facility], [address] [place], [place] [address]          Radiology Studies: CT Angio Chest PE W/Cm &/Or Wo Cm Result Date: 12/04/2023 CLINICAL DATA:  Respiratory distress.  History of lung cancer. EXAM: CT ANGIOGRAPHY CHEST WITH CONTRAST TECHNIQUE: Multidetector CT imaging of the chest was performed using the standard protocol during bolus administration of intravenous contrast. Multiplanar CT image reconstructions and MIPs were obtained to evaluate the vascular anatomy. RADIATION DOSE REDUCTION: This exam was performed according to the departmental dose-optimization program which includes automated exposure control, adjustment of the mA and/or kV according to patient size and/or use of iterative reconstruction technique. CONTRAST:  75mL OMNIPAQUE  IOHEXOL  350 MG/ML SOLN COMPARISON:  Chest x-ray from same day. CT chest dated September 13, 2023. FINDINGS: Cardiovascular: Suboptimal opacification of the pulmonary arteries to the segmental level. No evidence of pulmonary embolism. Normal heart size. No pericardial effusion. No thoracic aortic aneurysm or dissection. Coronary, aortic arch, and branch vessel atherosclerotic vascular disease. Mediastinum/Nodes: No enlarged mediastinal, hilar, or axillary lymph nodes. Thyroid gland, trachea, and esophagus  demonstrate no significant findings. Lungs/Pleura: New slightly expansile area of mucous airways impaction in the medial left lower lobe (series 10, image 114). Irregular, branching right lower lobe nodule is slightly increased in size in the interval, currently measuring approximately 2.1 x 2.0 cm (series 10, image 113), previously 1.9 x 1.9 cm. 8 mm ground-glass nodule in the anterior right upper lobe is not significantly changed (series 10, image 49). Similar bronchial wall thickening and cylindrical bronchiectasis in both lungs. No focal consolidation, pleural effusion, or pneumothorax. Mild centrilobular emphysema again noted. Upper Abdomen: No acute abnormality. Musculoskeletal: No chest wall abnormality. No acute or significant osseous findings. Review of the MIP images confirms the above findings. IMPRESSION: 1. Suboptimal study.  No evidence of pulmonary embolism. 2. Irregular, branching right lower lobe nodule is slightly increased in size in the interval, currently measuring approximately 2.1 x 2.0 cm. This remains concerning for malignancy. 3. Aortic Atherosclerosis (ICD10-I70.0) and Emphysema (ICD10-J43.9). Electronically Signed   By: Elsie ONEIDA Shoulder M.D.   On: 12/04/2023 17:03   DG Chest 2 View Result Date: 12/04/2023 CLINICAL DATA:  Shortness of breath. EXAM: CHEST - 2 VIEW COMPARISON:  10/27/2023. FINDINGS: Bilateral lung fields are clear. Bilateral costophrenic angles are clear. Normal cardio-mediastinal silhouette. There is a small retrocardiac hiatal hernia. No acute osseous abnormalities. The soft tissues are within normal limits. IMPRESSION:  No active cardiopulmonary disease. Electronically Signed   By: Ree Molt M.D.   On: 12/04/2023 16:13        Scheduled Meds:  azithromycin   500 mg Oral Daily   enoxaparin  (LOVENOX ) injection  40 mg Subcutaneous Q24H   guaiFENesin -dextromethorphan   15 mL Oral Q8H   methylPREDNISolone  (SOLU-MEDROL ) injection  60 mg Intravenous Q12H   Followed  by   NOREEN ON 12/06/2023] predniSONE   40 mg Oral Q breakfast   mometasone -formoterol   2 puff Inhalation BID   umeclidinium bromide   1 puff Inhalation Daily   Continuous Infusions:  albuterol  Stopped (12/04/23 1932)          Sophie Mao, MD [facility] 12/05/2023, 6:58 AM

## 2023-12-05 NOTE — TOC Initial Note (Signed)
 Transition of Care [facility]) - Initial/Assessment Note    Patient Details  Name: Miguel Marsh MRN: [id number] Date of Birth: 1960/11/15  Transition of Care [facility]) CM/SW Contact:    Noreen KATHEE Cleotilde ISRAEL Phone Number: 12/05/2023, 8:43 AM  Clinical Narrative:                  CSW spoke with patient this morning and assessed him. Patient lives alone but has an Aide that comes every day after 4 pm and stay for 2-3 hours. Patient uses oxygen  at home and is on 3L . Patient stated that he was unsure who his oxygen  company was but according to chart it looks like [employer]. Patient has a WC at home. Patient uses public transportation to get where he needs to go. TOC will continue to follow.   Expected Discharge Plan: Home/Self Care Barriers to Discharge: Continued Medical Work up   Patient Goals and CMS Choice Patient states their goals for this hospitalization and ongoing recovery are:: return back home CMS Medicare.gov Compare Post Acute Care list provided to:: Patient Choice offered to / list presented to : Patient [place] ownership interest in [facility].provided to:: Patient    Expected Discharge Plan and Services In-house Referral: Clinical Social Work Discharge Planning Services: CM Consult Post Acute Care Choice: Horticulturist, [facility] Healthcare aide through [facility]) Living arrangements for the past 2 months: Apartment                   DME Agency: [employer]      Prior Living Arrangements/Services Living arrangements for the past 2 months: Apartment Lives with:: Self Patient language and need for interpreter reviewed:: Yes Do you feel safe going back to the place where you live?: Yes      Need for Family Participation in Patient Care: Yes (Comment) Care giver support system in place?: Yes (comment) Current home services: DME, Homehealth aide Criminal Activity/Legal Involvement Pertinent to Current Situation/Hospitalization: No - Comment as  needed  Activities of Daily Living   ADL Screening (condition at time of admission) Independently performs ADLs?: Yes (appropriate for developmental age) Is the patient deaf or have difficulty hearing?: No Does the patient have difficulty seeing, even when wearing glasses/contacts?: No Does the patient have difficulty concentrating, remembering, or making decisions?: No  Permission Sought/Granted      Share Information with NAME: Daleon     Permission granted to share info w Relationship: Patient     Emotional Assessment Appearance:: Appears stated age   Affect (typically observed): Accepting, Appropriate Orientation: : Oriented to Self, Oriented to Place, Oriented to  Time, Oriented to Situation Alcohol  / Substance Use: Not Applicable Psych Involvement: No (comment)  Admission diagnosis:  Shortness of breath [R06.02] COPD with acute exacerbation (HCC) [J44.1] Patient Active Problem List   Diagnosis Date Noted   Cocaine abuse (HCC) 09/13/2023   CAP (community acquired pneumonia) 06/24/2023   Lung nodule 06/24/2023   COVID-19 virus infection 06/24/2023   Elevated brain natriuretic peptide (BNP) level 03/06/2023   Elevated MCV 03/06/2023   Acute metabolic encephalopathy 02/01/2023   Leukocytosis 10/06/2020   Goals of care, counseling/discussion    Palliative care by specialist    DNR (do not resuscitate) discussion    Hyperglycemia 04/12/2020   Chronic respiratory failure with hypoxia and hypercapnia (HCC) 01/22/2020   Acute respiratory failure (HCC) 01/23/2018   Acute on chronic respiratory failure with hypoxia and hypercapnia (HCC) 01/23/2018   Acute on chronic respiratory  failure with hypoxia (HCC) 01/01/2018   Obesity, Class III, BMI 40-49.9 (morbid obesity) (HCC) 01/01/2018   Acute exacerbation of chronic obstructive pulmonary disease (COPD) (HCC) 01/01/2018   Essential hypertension 12/09/2015   Non-small cell cancer of right lung (in remission) 12/09/2015   GERD  (gastroesophageal reflux disease) 12/09/2015   Tobacco use disorder 12/17/2014   COPD exacerbation (HCC) 12/17/2014   Obesity 12/17/2014   Dyspnea    Difficulty walking 04/02/2013   Hip weakness 04/02/2013   PCP:  Renato Dorothey HERO, NP Pharmacy:   [facility] [phone] - [place], [place] - [address] [place] [address] Phone: [phone] Fax: [phone]     Social Drivers of Health (SDOH) Social History: SDOH Screenings   Food Insecurity: No Food Insecurity (12/05/2023)  Housing: Patient Declined (12/05/2023)  Transportation Needs: Patient Declined (12/05/2023)  Utilities: Not At Risk (12/05/2023)  Tobacco Use: Medium Risk (12/04/2023)   SDOH Interventions:     Readmission Risk Interventions    10/29/2023    4:27 PM 06/25/2023    9:30 AM 03/07/2023    9:19 AM  Readmission Risk Prevention Plan  Transportation Screening Complete Complete Complete  HRI or Home Care Consult  Complete Complete  Social Work Consult for Recovery Care Planning/Counseling  Complete Complete  Palliative Care Screening  Not Applicable Not Applicable  Medication Review Oceanographer) Complete Complete Complete  HRI or Home Care Consult Complete    SW Recovery Care/Counseling Consult Complete    Palliative Care Screening Not Applicable    Skilled Nursing Facility Patient Refused

## 2023-12-05 NOTE — Progress Notes (Signed)
 Patient states that he is having chest pain on the right side that comes and goes. Patient states that he has this pain from the cancer. MD Maury Space notified.

## 2023-12-05 NOTE — Progress Notes (Signed)
 Patient arrived to floor earlier in shift.  Any  movement from patient causes him to be extremely short of breath and has pursed lip breathing. It takes him a long time to recover from any activity. Patient is alert and oriented and can respond in sentences. Vitals have been stable, oxygen  stable on 4 liters.

## 2023-12-05 NOTE — Care Management Obs Status (Signed)
 MEDICARE OBSERVATION STATUS NOTIFICATION   Patient Details  Name: Miguel Marsh MRN: [id number] Date of Birth: 16-Jul-1961   Medicare Observation Status Notification Given:  Yes    Shanique Aslinger L Hazen Brumett 12/05/2023, 4:08 PM

## 2023-12-06 DIAGNOSIS — I10 Essential (primary) hypertension: Secondary | ICD-10-CM | POA: Diagnosis present

## 2023-12-06 DIAGNOSIS — Z87891 Personal history of nicotine dependence: Secondary | ICD-10-CM | POA: Diagnosis not present

## 2023-12-06 DIAGNOSIS — Z1152 Encounter for screening for COVID-19: Secondary | ICD-10-CM | POA: Diagnosis not present

## 2023-12-06 DIAGNOSIS — Z8249 Family history of ischemic heart disease and other diseases of the circulatory system: Secondary | ICD-10-CM | POA: Diagnosis not present

## 2023-12-06 DIAGNOSIS — Z79899 Other long term (current) drug therapy: Secondary | ICD-10-CM | POA: Diagnosis not present

## 2023-12-06 DIAGNOSIS — C3491 Malignant neoplasm of unspecified part of right bronchus or lung: Secondary | ICD-10-CM | POA: Diagnosis present

## 2023-12-06 DIAGNOSIS — J439 Emphysema, unspecified: Secondary | ICD-10-CM | POA: Diagnosis present

## 2023-12-06 DIAGNOSIS — Z7951 Long term (current) use of inhaled steroids: Secondary | ICD-10-CM | POA: Diagnosis not present

## 2023-12-06 DIAGNOSIS — Z886 Allergy status to analgesic agent status: Secondary | ICD-10-CM | POA: Diagnosis not present

## 2023-12-06 DIAGNOSIS — J441 Chronic obstructive pulmonary disease with (acute) exacerbation: Secondary | ICD-10-CM | POA: Diagnosis present

## 2023-12-06 DIAGNOSIS — Z96649 Presence of unspecified artificial hip joint: Secondary | ICD-10-CM | POA: Diagnosis present

## 2023-12-06 DIAGNOSIS — J9611 Chronic respiratory failure with hypoxia: Secondary | ICD-10-CM | POA: Diagnosis present

## 2023-12-06 DIAGNOSIS — E872 Acidosis, unspecified: Secondary | ICD-10-CM | POA: Diagnosis present

## 2023-12-06 DIAGNOSIS — D638 Anemia in other chronic diseases classified elsewhere: Secondary | ICD-10-CM | POA: Diagnosis present

## 2023-12-06 DIAGNOSIS — R0602 Shortness of breath: Secondary | ICD-10-CM | POA: Diagnosis present

## 2023-12-06 DIAGNOSIS — F1410 Cocaine abuse, uncomplicated: Secondary | ICD-10-CM | POA: Diagnosis present

## 2023-12-06 DIAGNOSIS — J9612 Chronic respiratory failure with hypercapnia: Secondary | ICD-10-CM | POA: Diagnosis present

## 2023-12-06 DIAGNOSIS — Z9981 Dependence on supplemental oxygen: Secondary | ICD-10-CM | POA: Diagnosis not present

## 2023-12-06 MED ORDER — METHYLPREDNISOLONE SODIUM SUCC 40 MG IJ SOLR
40.0000 mg | Freq: Two times a day (BID) | INTRAMUSCULAR | Status: DC
  Administered 2023-12-06 (×2): 40 mg via INTRAVENOUS
  Filled 2023-12-06 (×3): qty 1

## 2023-12-06 MED ORDER — METHYLPREDNISOLONE SODIUM SUCC 40 MG IJ SOLR: 40.0000 mg | Freq: Two times a day (BID) | INTRAMUSCULAR | Status: DC

## 2023-12-06 NOTE — Progress Notes (Signed)
 PROGRESS NOTE    Miguel Marsh  FMW:[id number] DOB: 1960/12/18 DOA: 12/04/2023 PCP: Renato Dorothey HERO, NP   Brief Narrative:  Patient is a 63 year old male with history of COPD, chronic respiratory failure on 3 L oxygen  by nasal cannula, non-small cell lung cancer, hypertension presented with worsening shortness of breath.  On presentation, CTA chest was suboptimal study, no PE, irregular right lower lobe nodule concerning for malignancy, emphysema.  He was started on IV Solu-Medrol  along with nebs.  Assessment & Plan:   COPD exacerbation Chronic respiratory with hypoxia -Imaging as above.  Respiratory status currently stable: Currently requiring 3 L oxygen  via nasal cannula which he normally wears at home -Feel slightly better but does not feel ready to go home yet.  Continue Solu-Medrol  but decrease to 40 mg IV every 12 hours.  Continue current meds along with oral Zithromax .  COVID/influenza/RSV PCR negative on presentation  Non-small cell lung cancer of right lung, apparently in remission -Currently follows at [facility]. Currently on radiotherapy.  Outpatient follow-up with oncology  Essential hypertension -Blood pressure intermittently elevated.  Not on antihypertensives at home.  Continue as needed antihypertensives.  Lactic acidosis--possible from COPD exacerbation.  Resolved  Anemia of chronic disease -Possibly from chronic illnesses.  Hemoglobin stable.  Monitor intermittently  DVT prophylaxis: Lovenox  Code Status: Full Family Communication: None at bedside Disposition Plan: Status is: Observation The patient will require care spanning > 2 midnights and should be moved to inpatient because: Of severity of illness  Consultants: None  Procedures: None  Antimicrobials:  Anti-infectives (From admission, onward)    Start     Dose/Rate Route Frequency Ordered Stop   12/05/23 2315  azithromycin  (ZITHROMAX ) tablet 500 mg       Placed in Followed by Linked  Group   500 mg Oral Daily 12/04/23 2311 12/09/23 0959   12/04/23 2315  azithromycin  (ZITHROMAX ) 500 mg in sodium chloride  0.9 % 250 mL IVPB       Placed in Followed by Linked Group   500 mg 250 mL/hr over 60 Minutes Intravenous Every 24 hours 12/04/23 2311 12/05/23 1047        Subjective: Patient seen and examined at bedside.  Feels slightly better but still short of breath with exertion and does not feel ready to go home today.  Denies any chest pain, fever or vomiting.  Objective: Vitals:   12/06/23 0522 12/06/23 0733 12/06/23 0734 12/06/23 0735  BP: (!) 154/73     Pulse: 75     Resp: (!) 22     Temp: (!) 97.5 F (36.4 C)     TempSrc: Oral     SpO2: 100% 98% 98% 98%  Weight:      Height:        Intake/Output Summary (Last 24 hours) at 12/06/2023 0921 Last data filed at 12/06/2023 0500 Gross per 24 hour  Intake 480 ml  Output 2800 ml  Net -2320 ml   Filed Weights   12/04/23 1431 12/05/23 0100 12/06/23 0500  Weight: 74.8 kg 73.8 kg 74.7 kg    Examination:  General: Currently on 3 L oxygen  via nasal cannula.  No distress ENT/neck: No thyromegaly.  JVD is not elevated  respiratory: Decreased breath sounds at bases bilaterally with some crackles; some wheezing heard  CVS: S1-S2 heard, rate controlled currently Abdominal: Soft, nontender, slightly distended; no organomegaly, bowel sounds are heard Extremities: Trace lower extremity edema; no cyanosis  CNS: Awake and alert.  No focal neurologic deficit.  Moves extremities Lymph: No obvious lymphadenopathy Skin: No obvious ecchymosis/lesions  psych: No signs of agitation.  Affect is mostly flat. musculoskeletal: No obvious joint swelling/deformity    Data Reviewed: I have personally reviewed following labs and imaging studies  CBC: Recent Labs  Lab 12/04/23 1448  WBC 6.5  NEUTROABS 3.8  HGB 12.2*  HCT 37.9*  MCV 94.5  PLT 225   Basic Metabolic Panel: Recent Labs  Lab 12/04/23 1448  NA 139  K 3.6  CL  95*  CO2 36*  GLUCOSE 96  BUN 6*  CREATININE 0.55*  CALCIUM 8.9   GFR: Estimated Creatinine Clearance: 89.5 mL/min (A) (by C-G formula based on SCr of 0.55 mg/dL (L)). Liver Function Tests: Recent Labs  Lab 12/04/23 1448  AST 13*  ALT 11  ALKPHOS 62  BILITOT 0.8  PROT 6.5  ALBUMIN 3.5   No results for input(s): LIPASE, AMYLASE in the last 168 hours. No results for input(s): AMMONIA in the last 168 hours. Coagulation Profile: No results for input(s): INR, PROTIME in the last 168 hours. Cardiac Enzymes: No results for input(s): CKTOTAL, CKMB, CKMBINDEX, TROPONINI in the last 168 hours. BNP (last 3 results) No results for input(s): PROBNP in the last 8760 hours. HbA1C: No results for input(s): HGBA1C in the last 72 hours. CBG: No results for input(s): GLUCAP in the last 168 hours. Lipid Profile: No results for input(s): CHOL, HDL, LDLCALC, TRIG, CHOLHDL, LDLDIRECT in the last 72 hours. Thyroid Function Tests: No results for input(s): TSH, T4TOTAL, FREET4, T3FREE, THYROIDAB in the last 72 hours. Anemia Panel: No results for input(s): VITAMINB12, FOLATE, FERRITIN, TIBC, IRON, RETICCTPCT in the last 72 hours. Sepsis Labs: Recent Labs  Lab 12/04/23 1435 12/04/23 1619 12/04/23 1911  LATICACIDVEN 0.8 2.3* 1.9    Recent Results (from the past 240 hours)  Resp panel by RT-PCR (RSV, Flu A&B, Covid) Anterior Nasal Swab     Status: None   Collection Time: 12/04/23  2:54 PM   Specimen: Anterior Nasal Swab  Result Value Ref Range Status   SARS Coronavirus 2 by RT PCR NEGATIVE NEGATIVE Final    Comment: (NOTE) SARS-CoV-2 target nucleic acids are NOT DETECTED.  The SARS-CoV-2 RNA is generally detectable in upper respiratory specimens during the acute phase of infection. The lowest concentration of SARS-CoV-2 viral copies this assay can detect is 138 copies/mL. A negative result does not preclude SARS-Cov-2 infection  and should not be used as the sole basis for treatment or other patient management decisions. A negative result may occur with  improper specimen collection/handling, submission of specimen other than nasopharyngeal swab, presence of viral mutation(s) within the areas targeted by this assay, and inadequate number of viral copies(<138 copies/mL). A negative result must be combined with clinical observations, patient history, and epidemiological information. The expected result is Negative.  Fact Sheet for Patients:  bloggercourse.com  Fact Sheet for Healthcare Providers:  seriousbroker.it  This test is no t yet approved or cleared by the [place]  FDA and  has been authorized for detection and/or diagnosis of SARS-CoV-2 by FDA under an Emergency Use Authorization (EUA). This EUA will remain  in effect (meaning this test can be used) for the duration of the COVID-19 declaration under Section 564(b)(1) of the Act, 21 U.S.C.section 360bbb-3(b)(1), unless the authorization is terminated  or revoked sooner.       Influenza A by PCR NEGATIVE NEGATIVE Final   Influenza B by PCR NEGATIVE NEGATIVE Final    Comment: (NOTE) The Xpert  Xpress SARS-CoV-2/FLU/RSV plus assay is intended as an aid in the diagnosis of influenza from Nasopharyngeal swab specimens and should not be used as a sole basis for treatment. Nasal washings and aspirates are unacceptable for Xpert Xpress SARS-CoV-2/FLU/RSV testing.  Fact Sheet for Patients: bloggercourse.com  Fact Sheet for Healthcare Providers: seriousbroker.it  This test is not yet approved or cleared by the [place]  FDA and has been authorized for detection and/or diagnosis of SARS-CoV-2 by FDA under an Emergency Use Authorization (EUA). This EUA will remain in effect (meaning this test can be used) for the duration of the COVID-19 declaration  under Section 564(b)(1) of the Act, 21 U.S.C. section 360bbb-3(b)(1), unless the authorization is terminated or revoked.     Resp Syncytial Virus by PCR NEGATIVE NEGATIVE Final    Comment: (NOTE) Fact Sheet for Patients: bloggercourse.com  Fact Sheet for Healthcare Providers: seriousbroker.it  This test is not yet approved or cleared by the [place]  FDA and has been authorized for detection and/or diagnosis of SARS-CoV-2 by FDA under an Emergency Use Authorization (EUA). This EUA will remain in effect (meaning this test can be used) for the duration of the COVID-19 declaration under Section 564(b)(1) of the Act, 21 U.S.C. section 360bbb-3(b)(1), unless the authorization is terminated or revoked.  Performed at [facility], [address], [place], [place] [address]          Radiology Studies: CT Angio Chest PE W/Cm &/Or Wo Cm Result Date: 12/04/2023 CLINICAL DATA:  Respiratory distress.  History of lung cancer. EXAM: CT ANGIOGRAPHY CHEST WITH CONTRAST TECHNIQUE: Multidetector CT imaging of the chest was performed using the standard protocol during bolus administration of intravenous contrast. Multiplanar CT image reconstructions and MIPs were obtained to evaluate the vascular anatomy. RADIATION DOSE REDUCTION: This exam was performed according to the departmental dose-optimization program which includes automated exposure control, adjustment of the mA and/or kV according to patient size and/or use of iterative reconstruction technique. CONTRAST:  75mL OMNIPAQUE  IOHEXOL  350 MG/ML SOLN COMPARISON:  Chest x-ray from same day. CT chest dated September 13, 2023. FINDINGS: Cardiovascular: Suboptimal opacification of the pulmonary arteries to the segmental level. No evidence of pulmonary embolism. Normal heart size. No pericardial effusion. No thoracic aortic aneurysm or dissection. Coronary, aortic arch, and branch vessel atherosclerotic  vascular disease. Mediastinum/Nodes: No enlarged mediastinal, hilar, or axillary lymph nodes. Thyroid gland, trachea, and esophagus demonstrate no significant findings. Lungs/Pleura: New slightly expansile area of mucous airways impaction in the medial left lower lobe (series 10, image 114). Irregular, branching right lower lobe nodule is slightly increased in size in the interval, currently measuring approximately 2.1 x 2.0 cm (series 10, image 113), previously 1.9 x 1.9 cm. 8 mm ground-glass nodule in the anterior right upper lobe is not significantly changed (series 10, image 49). Similar bronchial wall thickening and cylindrical bronchiectasis in both lungs. No focal consolidation, pleural effusion, or pneumothorax. Mild centrilobular emphysema again noted. Upper Abdomen: No acute abnormality. Musculoskeletal: No chest wall abnormality. No acute or significant osseous findings. Review of the MIP images confirms the above findings. IMPRESSION: 1. Suboptimal study.  No evidence of pulmonary embolism. 2. Irregular, branching right lower lobe nodule is slightly increased in size in the interval, currently measuring approximately 2.1 x 2.0 cm. This remains concerning for malignancy. 3. Aortic Atherosclerosis (ICD10-I70.0) and Emphysema (ICD10-J43.9). Electronically Signed   By: Elsie ONEIDA Shoulder M.D.   On: 12/04/2023 17:03   DG Chest 2 View Result Date: 12/04/2023 CLINICAL DATA:  Shortness of breath.  EXAM: CHEST - 2 VIEW COMPARISON:  10/27/2023. FINDINGS: Bilateral lung fields are clear. Bilateral costophrenic angles are clear. Normal cardio-mediastinal silhouette. There is a small retrocardiac hiatal hernia. No acute osseous abnormalities. The soft tissues are within normal limits. IMPRESSION: No active cardiopulmonary disease. Electronically Signed   By: Ree Molt M.D.   On: 12/04/2023 16:13        Scheduled Meds:  azithromycin   500 mg Oral Daily   enoxaparin  (LOVENOX ) injection  40 mg Subcutaneous  Q24H   ipratropium-albuterol   3 mL Nebulization Q6H   methylPREDNISolone  (SOLU-MEDROL ) injection  80 mg Intravenous Q12H   mometasone -formoterol   2 puff Inhalation BID   umeclidinium bromide   1 puff Inhalation Daily   Continuous Infusions:  albuterol  Stopped (12/04/23 1932)          Sophie Mao, MD [facility] 12/06/2023, 9:21 AM

## 2023-12-06 NOTE — Progress Notes (Signed)
 Mobility Specialist Progress Note:    12/06/23 1511  Mobility  Activity Ambulated with assistance in hallway  Level of Assistance Standby assist, set-up cues, supervision of patient - no hands on  Assistive Device None  Distance Ambulated (ft) 100 ft  Range of Motion/Exercises Active;All extremities  Activity Response Tolerated well  Mobility Referral Yes  Mobility visit 1 Mobility  Mobility Specialist Start Time (ACUTE ONLY) 1440  Mobility Specialist Stop Time (ACUTE ONLY) 1505  Mobility Specialist Time Calculation (min) (ACUTE ONLY) 25 min   Pt received in bed, agreeable to mobility. Required SBA to stand and ambulate with no AD. Tolerated well, SpO2 97% on 4L throughout session. Pt c/o fatigue near EOS. Returned to room, left pt sitting EOB. All needs met   Sherrilee Ditty Mobility Specialist Please contact via SecureChat or  Rehab office at [phone]

## 2023-12-06 NOTE — Plan of Care (Signed)

## 2023-12-07 DIAGNOSIS — J441 Chronic obstructive pulmonary disease with (acute) exacerbation: Secondary | ICD-10-CM | POA: Diagnosis not present

## 2023-12-07 MED ORDER — PREDNISONE 20 MG PO TABS: 40.0000 mg | ORAL_TABLET | Freq: Every day | ORAL | 0 refills | Status: AC

## 2023-12-07 MED ORDER — METHYLPREDNISOLONE SODIUM SUCC 40 MG IJ SOLR
40.0000 mg | Freq: Two times a day (BID) | INTRAMUSCULAR | Status: DC
  Administered 2023-12-07 – 2023-12-08 (×3): 40 mg via INTRAVENOUS
  Filled 2023-12-07 (×2): qty 1

## 2023-12-07 NOTE — Discharge Summary (Signed)
 Physician Discharge Summary  Miguel Marsh FMW:[id number] DOB: February 17, 1961 DOA: 12/04/2023  PCP: Renato Dorothey HERO, NP  Admit date: 12/04/2023 Discharge date: 12/08/2023  Admitted From: Home Disposition: Home  Recommendations for Outpatient Follow-up:  Follow up with PCP in 1 week with repeat CBC/BMP Follow up in ED if symptoms worsen or new appear   Home Health: No Equipment/Devices: None  Discharge Condition: Stable CODE STATUS: Full Diet recommendation: Heart healthy  Brief/Interim Summary: Miguel Marsh is a 63 year old male with history of COPD, chronic respiratory failure on 3 L oxygen  by nasal cannula, non-small cell lung cancer, hypertension presented with worsening shortness of breath. On presentation, CTA chest was suboptimal study, no PE, irregular right lower lobe nodule concerning for malignancy, emphysema. He was started on IV Solu-Medrol  along with nebs.  During the hospitalizing, his condition is improved.  He feels much better and feels okay to go home today.  Discharge patient home today on oral prednisone .  Outpatient follow-up with PCP.  Discharge Diagnoses:   COPD exacerbation Chronic respiratory with hypoxia -Imaging as above.  Respiratory status currently stable: Currently requiring 3 L oxygen  via nasal cannula which he normally wears at home -Currently on IV Solu-Medrol  along with nebs and Zithromax .  COVID/influenza/RSV PCR negative on presentation -Feels better and feels okay to go home today.  Discharge patient home today on oral prednisone  40 mg daily for 7 days.  Continue home inhaled regimen.   Non-small cell lung cancer of right lung, apparently in remission -Currently follows at [facility]. Currently on radiotherapy.  Outpatient follow-up with oncology   Essential hypertension -Blood pressure intermittently elevated.  Not on antihypertensives at home.  Outpatient follow-up with PCP.   Lactic acidosis--possible from COPD exacerbation.  Resolved    Anemia of chronic disease -Possibly from chronic illnesses.  Hemoglobin stable.  Monitor intermittently as an outpatient  Discharge Instructions   Allergies as of 12/07/2023       Reactions   Ibuprofen  Shortness Of Breath        Medication List     TAKE these medications    albuterol  (2.5 MG/3ML) 0.083% nebulizer solution Commonly known as: PROVENTIL  Inhale 3 mLs (2.5 mg total) into the lungs every 6 (six) hours as needed for wheezing or shortness of breath.   albuterol  108 (90 Base) MCG/ACT inhaler Commonly known as: VENTOLIN  HFA Inhale 2 puffs into the lungs every 6 (six) hours as needed.   Breztri  Aerosphere 160-9-4.8 MCG/ACT Aero Generic drug: Budeson-Glycopyrrol-Formoterol  Inhale 2 puffs into the lungs 2 (two) times daily.   clonazePAM  1 MG tablet Commonly known as: KLONOPIN  Take 1 tablet (1 mg total) by mouth 2 (two) times daily as needed for anxiety.   furosemide  40 MG tablet Commonly known as: LASIX  Take 0.5 tablets (20 mg total) by mouth See admin instructions. Take once a day  as needed for fluid/swelling   omeprazole  40 MG capsule Commonly known as: PRILOSEC Take 1 capsule (40 mg total) by mouth daily.   oxyCODONE  15 MG immediate release tablet Commonly known as: ROXICODONE  Take 15 mg by mouth every 4 (four) hours as needed for pain.   OXYGEN  Inhale 3 L into the lungs continuous.   predniSONE  20 MG tablet Commonly known as: DELTASONE  Take 2 tablets (40 mg total) by mouth daily with breakfast for 7 days. Start taking on: December 08, 2023        Follow-up Information     Renato Dorothey HERO, NP .   Specialty: Internal Medicine Contact information:  [address] [place]  [address] [place] [address] [phone]         [facility] at [facility] .   Specialty: Emergency Medicine Contact information: [address] [place]  [address] [phone]        Renato Dorothey HERO, NP. Schedule an appointment  as soon as possible for a visit in 1 week(s).   Specialty: Internal Medicine Contact information: [address] [place]  [address] [place] [address] [phone]                Allergies  Allergen Reactions   Ibuprofen  Shortness Of Breath    Consultations: None   Procedures/Studies: CT Angio Chest PE W/Cm &/Or Wo Cm Result Date: 12/04/2023 CLINICAL DATA:  Respiratory distress.  History of lung cancer. EXAM: CT ANGIOGRAPHY CHEST WITH CONTRAST TECHNIQUE: Multidetector CT imaging of the chest was performed using the standard protocol during bolus administration of intravenous contrast. Multiplanar CT image reconstructions and MIPs were obtained to evaluate the vascular anatomy. RADIATION DOSE REDUCTION: This exam was performed according to the departmental dose-optimization program which includes automated exposure control, adjustment of the mA and/or kV according to patient size and/or use of iterative reconstruction technique. CONTRAST:  75mL OMNIPAQUE  IOHEXOL  350 MG/ML SOLN COMPARISON:  Chest x-ray from same day. CT chest dated September 13, 2023. FINDINGS: Cardiovascular: Suboptimal opacification of the pulmonary arteries to the segmental level. No evidence of pulmonary embolism. Normal heart size. No pericardial effusion. No thoracic aortic aneurysm or dissection. Coronary, aortic arch, and branch vessel atherosclerotic vascular disease. Mediastinum/Nodes: No enlarged mediastinal, hilar, or axillary lymph nodes. Thyroid gland, trachea, and esophagus demonstrate no significant findings. Lungs/Pleura: New slightly expansile area of mucous airways impaction in the medial left lower lobe (series 10, image 114). Irregular, branching right lower lobe nodule is slightly increased in size in the interval, currently measuring approximately 2.1 x 2.0 cm (series 10, image 113), previously 1.9 x 1.9 cm. 8 mm ground-glass nodule in the anterior right upper lobe is not significantly changed (series 10, image 49).  Similar bronchial wall thickening and cylindrical bronchiectasis in both lungs. No focal consolidation, pleural effusion, or pneumothorax. Mild centrilobular emphysema again noted. Upper Abdomen: No acute abnormality. Musculoskeletal: No chest wall abnormality. No acute or significant osseous findings. Review of the MIP images confirms the above findings. IMPRESSION: 1. Suboptimal study.  No evidence of pulmonary embolism. 2. Irregular, branching right lower lobe nodule is slightly increased in size in the interval, currently measuring approximately 2.1 x 2.0 cm. This remains concerning for malignancy. 3. Aortic Atherosclerosis (ICD10-I70.0) and Emphysema (ICD10-J43.9). Electronically Signed   By: Elsie ONEIDA Shoulder M.D.   On: 12/04/2023 17:03   DG Chest 2 View Result Date: 12/04/2023 CLINICAL DATA:  Shortness of breath. EXAM: CHEST - 2 VIEW COMPARISON:  10/27/2023. FINDINGS: Bilateral lung fields are clear. Bilateral costophrenic angles are clear. Normal cardio-mediastinal silhouette. There is a small retrocardiac hiatal hernia. No acute osseous abnormalities. The soft tissues are within normal limits. IMPRESSION: No active cardiopulmonary disease. Electronically Signed   By: Ree Molt M.D.   On: 12/04/2023 16:13      Subjective: Patient seen and examined at bedside.  No fever, vomiting, chest pain reported.  Feels okay to go home today.  Discharge Exam: Vitals:   12/06/23 2128 12/07/23 0431  BP:  133/75  Pulse:  81  Resp:  20  Temp:  98.1 F (36.7 C)  SpO2: 99% 99%    General: Pt is  alert, awake, not in acute distress.  On 3-4 L oxygen  via nasal cannula Cardiovascular: rate controlled, S1/S2 + Respiratory: bilateral decreased breath sounds at bases with some scattered crackles Abdominal: Soft, NT, ND, bowel sounds + Extremities: Trace lower extremity edema, no cyanosis    The results of significant diagnostics from this hospitalization (including imaging, microbiology, ancillary and  laboratory) are listed below for reference.     Microbiology: Recent Results (from the past 240 hours)  Resp panel by RT-PCR (RSV, Flu A&B, Covid) Anterior Nasal Swab     Status: None   Collection Time: 12/04/23  2:54 PM   Specimen: Anterior Nasal Swab  Result Value Ref Range Status   SARS Coronavirus 2 by RT PCR NEGATIVE NEGATIVE Final    Comment: (NOTE) SARS-CoV-2 target nucleic acids are NOT DETECTED.  The SARS-CoV-2 RNA is generally detectable in upper respiratory specimens during the acute phase of infection. The lowest concentration of SARS-CoV-2 viral copies this assay can detect is 138 copies/mL. A negative result does not preclude SARS-Cov-2 infection and should not be used as the sole basis for treatment or other patient management decisions. A negative result may occur with  improper specimen collection/handling, submission of specimen other than nasopharyngeal swab, presence of viral mutation(s) within the areas targeted by this assay, and inadequate number of viral copies(<138 copies/mL). A negative result must be combined with clinical observations, patient history, and epidemiological information. The expected result is Negative.  Fact Sheet for Patients:  bloggercourse.com  Fact Sheet for Healthcare Providers:  seriousbroker.it  This test is no t yet approved or cleared by the [place]  FDA and  has been authorized for detection and/or diagnosis of SARS-CoV-2 by FDA under an Emergency Use Authorization (EUA). This EUA will remain  in effect (meaning this test can be used) for the duration of the COVID-19 declaration under Section 564(b)(1) of the Act, 21 U.S.C.section 360bbb-3(b)(1), unless the authorization is terminated  or revoked sooner.       Influenza A by PCR NEGATIVE NEGATIVE Final   Influenza B by PCR NEGATIVE NEGATIVE Final    Comment: (NOTE) The Xpert Xpress SARS-CoV-2/FLU/RSV plus assay is  intended as an aid in the diagnosis of influenza from Nasopharyngeal swab specimens and should not be used as a sole basis for treatment. Nasal washings and aspirates are unacceptable for Xpert Xpress SARS-CoV-2/FLU/RSV testing.  Fact Sheet for Patients: bloggercourse.com  Fact Sheet for Healthcare Providers: seriousbroker.it  This test is not yet approved or cleared by the [place]  FDA and has been authorized for detection and/or diagnosis of SARS-CoV-2 by FDA under an Emergency Use Authorization (EUA). This EUA will remain in effect (meaning this test can be used) for the duration of the COVID-19 declaration under Section 564(b)(1) of the Act, 21 U.S.C. section 360bbb-3(b)(1), unless the authorization is terminated or revoked.     Resp Syncytial Virus by PCR NEGATIVE NEGATIVE Final    Comment: (NOTE) Fact Sheet for Patients: bloggercourse.com  Fact Sheet for Healthcare Providers: seriousbroker.it  This test is not yet approved or cleared by the [place]  FDA and has been authorized for detection and/or diagnosis of SARS-CoV-2 by FDA under an Emergency Use Authorization (EUA). This EUA will remain in effect (meaning this test can be used) for the duration of the COVID-19 declaration under Section 564(b)(1) of the Act, 21 U.S.C. section 360bbb-3(b)(1), unless the authorization is terminated or revoked.  Performed at [facility], [address] [place], [place] [address]  Labs: BNP (last 3 results) Recent Labs    06/25/23 1442 09/13/23 0914 12/04/23 1448  BNP 35.0 27.0 38.0   Basic Metabolic Panel: Recent Labs  Lab 12/04/23 1448  NA 139  K 3.6  CL 95*  CO2 36*  GLUCOSE 96  BUN 6*  CREATININE 0.55*  CALCIUM 8.9   Liver Function Tests: Recent Labs  Lab 12/04/23 1448  AST 13*  ALT 11  ALKPHOS 62  BILITOT 0.8  PROT 6.5  ALBUMIN 3.5   No  results for input(s): LIPASE, AMYLASE in the last 168 hours. No results for input(s): AMMONIA in the last 168 hours. CBC: Recent Labs  Lab 12/04/23 1448  WBC 6.5  NEUTROABS 3.8  HGB 12.2*  HCT 37.9*  MCV 94.5  PLT 225   Cardiac Enzymes: No results for input(s): CKTOTAL, CKMB, CKMBINDEX, TROPONINI in the last 168 hours. BNP: Invalid input(s): POCBNP CBG: No results for input(s): GLUCAP in the last 168 hours. D-Dimer No results for input(s): DDIMER in the last 72 hours. Hgb A1c No results for input(s): HGBA1C in the last 72 hours. Lipid Profile No results for input(s): CHOL, HDL, LDLCALC, TRIG, CHOLHDL, LDLDIRECT in the last 72 hours. Thyroid function studies No results for input(s): TSH, T4TOTAL, T3FREE, THYROIDAB in the last 72 hours.  Invalid input(s): FREET3 Anemia work up No results for input(s): VITAMINB12, FOLATE, FERRITIN, TIBC, IRON, RETICCTPCT in the last 72 hours. Urinalysis    Component Value Date/Time   COLORURINE YELLOW 03/06/2023 1130   APPEARANCEUR HAZY (A) 03/06/2023 1130   LABSPEC 1.023 03/06/2023 1130   PHURINE 5.0 03/06/2023 1130   GLUCOSEU 50 (A) 03/06/2023 1130   HGBUR NEGATIVE 03/06/2023 1130   BILIRUBINUR NEGATIVE 03/06/2023 1130   KETONESUR NEGATIVE 03/06/2023 1130   PROTEINUR 100 (A) 03/06/2023 1130   UROBILINOGEN 0.2 10/21/2013 0200   NITRITE NEGATIVE 03/06/2023 1130   LEUKOCYTESUR NEGATIVE 03/06/2023 1130   Sepsis Labs Recent Labs  Lab 12/04/23 1448  WBC 6.5   Microbiology Recent Results (from the past 240 hours)  Resp panel by RT-PCR (RSV, Flu A&B, Covid) Anterior Nasal Swab     Status: None   Collection Time: 12/04/23  2:54 PM   Specimen: Anterior Nasal Swab  Result Value Ref Range Status   SARS Coronavirus 2 by RT PCR NEGATIVE NEGATIVE Final    Comment: (NOTE) SARS-CoV-2 target nucleic acids are NOT DETECTED.  The SARS-CoV-2 RNA is generally detectable in upper  respiratory specimens during the acute phase of infection. The lowest concentration of SARS-CoV-2 viral copies this assay can detect is 138 copies/mL. A negative result does not preclude SARS-Cov-2 infection and should not be used as the sole basis for treatment or other patient management decisions. A negative result may occur with  improper specimen collection/handling, submission of specimen other than nasopharyngeal swab, presence of viral mutation(s) within the areas targeted by this assay, and inadequate number of viral copies(<138 copies/mL). A negative result must be combined with clinical observations, patient history, and epidemiological information. The expected result is Negative.  Fact Sheet for Patients:  bloggercourse.com  Fact Sheet for Healthcare Providers:  seriousbroker.it  This test is no t yet approved or cleared by the [place]  FDA and  has been authorized for detection and/or diagnosis of SARS-CoV-2 by FDA under an Emergency Use Authorization (EUA). This EUA will remain  in effect (meaning this test can be used) for the duration of the COVID-19 declaration under Section 564(b)(1) of the Act, 21 U.S.C.section 360bbb-3(b)(1), unless the  authorization is terminated  or revoked sooner.       Influenza A by PCR NEGATIVE NEGATIVE Final   Influenza B by PCR NEGATIVE NEGATIVE Final    Comment: (NOTE) The Xpert Xpress SARS-CoV-2/FLU/RSV plus assay is intended as an aid in the diagnosis of influenza from Nasopharyngeal swab specimens and should not be used as a sole basis for treatment. Nasal washings and aspirates are unacceptable for Xpert Xpress SARS-CoV-2/FLU/RSV testing.  Fact Sheet for Patients: bloggercourse.com  Fact Sheet for Healthcare Providers: seriousbroker.it  This test is not yet approved or cleared by the [place]  FDA and has been  authorized for detection and/or diagnosis of SARS-CoV-2 by FDA under an Emergency Use Authorization (EUA). This EUA will remain in effect (meaning this test can be used) for the duration of the COVID-19 declaration under Section 564(b)(1) of the Act, 21 U.S.C. section 360bbb-3(b)(1), unless the authorization is terminated or revoked.     Resp Syncytial Virus by PCR NEGATIVE NEGATIVE Final    Comment: (NOTE) Fact Sheet for Patients: bloggercourse.com  Fact Sheet for Healthcare Providers: seriousbroker.it  This test is not yet approved or cleared by the [place]  FDA and has been authorized for detection and/or diagnosis of SARS-CoV-2 by FDA under an Emergency Use Authorization (EUA). This EUA will remain in effect (meaning this test can be used) for the duration of the COVID-19 declaration under Section 564(b)(1) of the Act, 21 U.S.C. section 360bbb-3(b)(1), unless the authorization is terminated or revoked.  Performed at [facility], [address], [place], [place] [address]      Time coordinating discharge: 35 minutes  SIGNED:   Sophie Mao, MD  [facility] 12/08/2023, 8:57 AM

## 2023-12-07 NOTE — Progress Notes (Signed)
 PROGRESS NOTE    Miguel Marsh  FMW:[id number] DOB: 07/16/1961 DOA: 12/04/2023 PCP: Renato Dorothey HERO, NP   Brief Narrative:  Patient is a 63 year old male with history of COPD, chronic respiratory failure on 3 L oxygen  by nasal cannula, non-small cell lung cancer, hypertension presented with worsening shortness of breath.  On presentation, CTA chest was suboptimal study, no PE, irregular right lower lobe nodule concerning for malignancy, emphysema.  He was started on IV Solu-Medrol  along with nebs.  Assessment & Plan:   COPD exacerbation Chronic respiratory with hypoxia -Imaging as above.  Respiratory status currently stable: Currently requiring 3 L oxygen  via nasal cannula which he normally wears at home -Feel slightly better but does not feel ready to go home yet.  Continue Solu-Medrol  at current dose.  Continue current meds along with oral Zithromax .  COVID/influenza/RSV PCR negative on presentation  Non-small cell lung cancer of right lung, apparently in remission -Currently follows at [facility]. Currently on radiotherapy.  Outpatient follow-up with oncology  Essential hypertension -Blood pressure intermittently elevated.  Not on antihypertensives at home.  Continue as needed antihypertensives.  Lactic acidosis--possible from COPD exacerbation.  Resolved  Anemia of chronic disease -Possibly from chronic illnesses.  Hemoglobin stable.  Monitor intermittently  DVT prophylaxis: Lovenox  Code Status: Full Family Communication: None at bedside Disposition Plan: Status is: inpatient because: Of severity of illness  Consultants: None  Procedures: None  Antimicrobials:  Anti-infectives (From admission, onward)    Start     Dose/Rate Route Frequency Ordered Stop   12/05/23 2315  azithromycin  (ZITHROMAX ) tablet 500 mg       Placed in Followed by Linked Group   500 mg Oral Daily 12/04/23 2311 12/09/23 0959   12/04/23 2315  azithromycin  (ZITHROMAX ) 500 mg in sodium  chloride 0.9 % 250 mL IVPB       Placed in Followed by Linked Group   500 mg 250 mL/hr over 60 Minutes Intravenous Every 24 hours 12/04/23 2311 12/05/23 1047        Subjective: Patient seen and examined at bedside.  Still feels slightly short of breath with exertion and does not feel that he is ready to go home today.  Denies worsening chest pain, fever or vomiting. Objective: Vitals:   12/06/23 1950 12/06/23 2128 12/07/23 0431 12/07/23 0500  BP: (!) 143/78  133/75   Pulse: (!) 105  81   Resp: 20  20   Temp: 98 F (36.7 C)  98.1 F (36.7 C)   TempSrc: Oral     SpO2: 98% 99% 99%   Weight:    76.5 kg  Height:        Intake/Output Summary (Last 24 hours) at 12/07/2023 0941 Last data filed at 12/07/2023 0940 Gross per 24 hour  Intake 480 ml  Output 2000 ml  Net -1520 ml   Filed Weights   12/05/23 0100 12/06/23 0500 12/07/23 0500  Weight: 73.8 kg 74.7 kg 76.5 kg    Examination:  General: On 3 to 4 L oxygen  via nasal cannula.  No acute distress.  Chronically ill and deconditioned looking respiratory: Bilateral decreased breath sounds at bases with some scattered crackles  CVS: Currently rate controlled; S1-S2 heard  abdominal: Soft, nontender, mildly distended, no organomegaly; normal bowel sounds are heard  extremities: No clubbing; mild lower extremity edema present     Data Reviewed: I have personally reviewed following labs and imaging studies  CBC: Recent Labs  Lab 12/04/23 1448  WBC 6.5  NEUTROABS  3.8  HGB 12.2*  HCT 37.9*  MCV 94.5  PLT 225   Basic Metabolic Panel: Recent Labs  Lab 12/04/23 1448  NA 139  K 3.6  CL 95*  CO2 36*  GLUCOSE 96  BUN 6*  CREATININE 0.55*  CALCIUM 8.9   GFR: Estimated Creatinine Clearance: 89.5 mL/min (A) (by C-G formula based on SCr of 0.55 mg/dL (L)). Liver Function Tests: Recent Labs  Lab 12/04/23 1448  AST 13*  ALT 11  ALKPHOS 62  BILITOT 0.8  PROT 6.5  ALBUMIN 3.5   No results for input(s): LIPASE,  AMYLASE in the last 168 hours. No results for input(s): AMMONIA in the last 168 hours. Coagulation Profile: No results for input(s): INR, PROTIME in the last 168 hours. Cardiac Enzymes: No results for input(s): CKTOTAL, CKMB, CKMBINDEX, TROPONINI in the last 168 hours. BNP (last 3 results) No results for input(s): PROBNP in the last 8760 hours. HbA1C: No results for input(s): HGBA1C in the last 72 hours. CBG: No results for input(s): GLUCAP in the last 168 hours. Lipid Profile: No results for input(s): CHOL, HDL, LDLCALC, TRIG, CHOLHDL, LDLDIRECT in the last 72 hours. Thyroid Function Tests: No results for input(s): TSH, T4TOTAL, FREET4, T3FREE, THYROIDAB in the last 72 hours. Anemia Panel: No results for input(s): VITAMINB12, FOLATE, FERRITIN, TIBC, IRON, RETICCTPCT in the last 72 hours. Sepsis Labs: Recent Labs  Lab 12/04/23 1435 12/04/23 1619 12/04/23 1911  LATICACIDVEN 0.8 2.3* 1.9    Recent Results (from the past 240 hours)  Resp panel by RT-PCR (RSV, Flu A&B, Covid) Anterior Nasal Swab     Status: None   Collection Time: 12/04/23  2:54 PM   Specimen: Anterior Nasal Swab  Result Value Ref Range Status   SARS Coronavirus 2 by RT PCR NEGATIVE NEGATIVE Final    Comment: (NOTE) SARS-CoV-2 target nucleic acids are NOT DETECTED.  The SARS-CoV-2 RNA is generally detectable in upper respiratory specimens during the acute phase of infection. The lowest concentration of SARS-CoV-2 viral copies this assay can detect is 138 copies/mL. A negative result does not preclude SARS-Cov-2 infection and should not be used as the sole basis for treatment or other patient management decisions. A negative result may occur with  improper specimen collection/handling, submission of specimen other than nasopharyngeal swab, presence of viral mutation(s) within the areas targeted by this assay, and inadequate number of viral copies(<138  copies/mL). A negative result must be combined with clinical observations, patient history, and epidemiological information. The expected result is Negative.  Fact Sheet for Patients:  bloggercourse.com  Fact Sheet for Healthcare Providers:  seriousbroker.it  This test is no t yet approved or cleared by the [place]  FDA and  has been authorized for detection and/or diagnosis of SARS-CoV-2 by FDA under an Emergency Use Authorization (EUA). This EUA will remain  in effect (meaning this test can be used) for the duration of the COVID-19 declaration under Section 564(b)(1) of the Act, 21 U.S.C.section 360bbb-3(b)(1), unless the authorization is terminated  or revoked sooner.       Influenza A by PCR NEGATIVE NEGATIVE Final   Influenza B by PCR NEGATIVE NEGATIVE Final    Comment: (NOTE) The Xpert Xpress SARS-CoV-2/FLU/RSV plus assay is intended as an aid in the diagnosis of influenza from Nasopharyngeal swab specimens and should not be used as a sole basis for treatment. Nasal washings and aspirates are unacceptable for Xpert Xpress SARS-CoV-2/FLU/RSV testing.  Fact Sheet for Patients: bloggercourse.com  Fact Sheet for Healthcare Providers: seriousbroker.it  This test is not yet approved or cleared by the [place]  FDA and has been authorized for detection and/or diagnosis of SARS-CoV-2 by FDA under an Emergency Use Authorization (EUA). This EUA will remain in effect (meaning this test can be used) for the duration of the COVID-19 declaration under Section 564(b)(1) of the Act, 21 U.S.C. section 360bbb-3(b)(1), unless the authorization is terminated or revoked.     Resp Syncytial Virus by PCR NEGATIVE NEGATIVE Final    Comment: (NOTE) Fact Sheet for Patients: bloggercourse.com  Fact Sheet for Healthcare  Providers: seriousbroker.it  This test is not yet approved or cleared by the [place]  FDA and has been authorized for detection and/or diagnosis of SARS-CoV-2 by FDA under an Emergency Use Authorization (EUA). This EUA will remain in effect (meaning this test can be used) for the duration of the COVID-19 declaration under Section 564(b)(1) of the Act, 21 U.S.C. section 360bbb-3(b)(1), unless the authorization is terminated or revoked.  Performed at [facility], [address] [place], [place] [address]          Radiology Studies: No results found.       Scheduled Meds:  azithromycin   500 mg Oral Daily   enoxaparin  (LOVENOX ) injection  40 mg Subcutaneous Q24H   ipratropium-albuterol   3 mL Nebulization Q6H   methylPREDNISolone  (SOLU-MEDROL ) injection  40 mg Intravenous BID   mometasone -formoterol   2 puff Inhalation BID   umeclidinium bromide   1 puff Inhalation Daily   Continuous Infusions:  albuterol  Stopped (12/04/23 1932)          Sophie Mao, MD [facility] 12/07/2023, 9:41 AM

## 2023-12-08 DIAGNOSIS — J441 Chronic obstructive pulmonary disease with (acute) exacerbation: Secondary | ICD-10-CM | POA: Diagnosis not present

## 2023-12-08 NOTE — Plan of Care (Signed)

## 2023-12-08 NOTE — Progress Notes (Signed)
 Nsg Discharge Note  Admit Date:  12/04/2023 Discharge date: 12/08/2023   [facility] to be D/C'd Home per MD order.  AVS completed.  Copy for chart, and copy for patient signed, and dated. Patient/caregiver able to verbalize understanding.  Discharge Medication: Allergies as of 12/08/2023       Reactions   Ibuprofen  Shortness Of Breath        Medication List     TAKE these medications    albuterol  (2.5 MG/3ML) 0.083% nebulizer solution Commonly known as: PROVENTIL  Inhale 3 mLs (2.5 mg total) into the lungs every 6 (six) hours as needed for wheezing or shortness of breath.   albuterol  108 (90 Base) MCG/ACT inhaler Commonly known as: VENTOLIN  HFA Inhale 2 puffs into the lungs every 6 (six) hours as needed.   Breztri  Aerosphere 160-9-4.8 MCG/ACT Aero Generic drug: Budeson-Glycopyrrol-Formoterol  Inhale 2 puffs into the lungs 2 (two) times daily.   clonazePAM  1 MG tablet Commonly known as: KLONOPIN  Take 1 tablet (1 mg total) by mouth 2 (two) times daily as needed for anxiety.   furosemide  40 MG tablet Commonly known as: LASIX  Take 0.5 tablets (20 mg total) by mouth See admin instructions. Take once a day  as needed for fluid/swelling   omeprazole  40 MG capsule Commonly known as: PRILOSEC Take 1 capsule (40 mg total) by mouth daily.   oxyCODONE  15 MG immediate release tablet Commonly known as: ROXICODONE  Take 15 mg by mouth every 4 (four) hours as needed for pain.   OXYGEN  Inhale 3 L into the lungs continuous.   predniSONE  20 MG tablet Commonly known as: DELTASONE  Take 2 tablets (40 mg total) by mouth daily with breakfast for 7 days.        Discharge Assessment: Vitals:   12/08/23 0202 12/08/23 0502  BP:  124/78  Pulse:  82  Resp:  20  Temp:  97.7 F (36.5 C)  SpO2: 96% 100%   Skin clean, dry and intact without evidence of skin break down, no evidence of skin tears noted. IV catheter discontinued intact. Site without signs and symptoms of complications  - no redness or edema noted at insertion site, patient denies c/o pain - only slight tenderness at site.  Dressing with slight pressure applied.  D/c Instructions-Education: Discharge instructions given to patient/family with verbalized understanding. D/c education completed with patient/family including follow up instructions, medication list, d/c activities limitations if indicated, with other d/c instructions as indicated by MD - patient able to verbalize understanding, all questions fully answered. Patient instructed to return to ED, call 911, or call MD for any changes in condition.  Patient escorted via WC, and D/C home via private auto.  Dagoberto LITTIE Edison, RN 12/08/2023 10:03 AM

## 2023-12-08 NOTE — Progress Notes (Signed)
 Mobility Specialist Progress Note:    12/08/23 0912  Mobility  Activity Ambulated with assistance in hallway  Level of Assistance Standby assist, set-up cues, supervision of patient - no hands on  Assistive Device None  Distance Ambulated (ft) 50 ft  Range of Motion/Exercises Active;All extremities  Activity Response Tolerated well  Mobility Referral Yes  Mobility visit 1 Mobility  Mobility Specialist Start Time (ACUTE ONLY) 0840  Mobility Specialist Stop Time (ACUTE ONLY) 0900  Mobility Specialist Time Calculation (min) (ACUTE ONLY) 20 min   Pt received in bed, agreeable to mobility. Required SBA to stand and ambulate with no AD. Tolerated well, SPO2 98% on 4L before and after ambulation. Audible SOB, pt c/o SOB. Returned to room, left pt sitting EOB. All needs met.   Sherrilee Ditty Mobility Specialist Please contact via Special Educational Needs Teacher or  Rehab office at [phone]

## 2024-01-14 ENCOUNTER — Other Ambulatory Visit: Payer: Self-pay

## 2024-01-14 ENCOUNTER — Emergency Department (HOSPITAL_COMMUNITY)

## 2024-01-14 ENCOUNTER — Inpatient Hospital Stay (HOSPITAL_COMMUNITY)
Admission: EM | Admit: 2024-01-14 | Discharge: 2024-01-17 | DRG: 193 | Disposition: A | Discharge status: Home / Self Care | Attending: Internal Medicine | Admitting: Internal Medicine

## 2024-01-14 ENCOUNTER — Encounter (HOSPITAL_COMMUNITY): Payer: Self-pay

## 2024-01-14 DIAGNOSIS — J189 Pneumonia, unspecified organism: Principal | ICD-10-CM | POA: Diagnosis present

## 2024-01-14 DIAGNOSIS — J9621 Acute and chronic respiratory failure with hypoxia: Secondary | ICD-10-CM | POA: Diagnosis present

## 2024-01-14 DIAGNOSIS — K219 Gastro-esophageal reflux disease without esophagitis: Secondary | ICD-10-CM | POA: Insufficient documentation

## 2024-01-14 DIAGNOSIS — Z96649 Presence of unspecified artificial hip joint: Secondary | ICD-10-CM | POA: Diagnosis present

## 2024-01-14 DIAGNOSIS — Z833 Family history of diabetes mellitus: Secondary | ICD-10-CM

## 2024-01-14 DIAGNOSIS — J441 Chronic obstructive pulmonary disease with (acute) exacerbation: Secondary | ICD-10-CM | POA: Diagnosis present

## 2024-01-14 DIAGNOSIS — F419 Anxiety disorder, unspecified: Secondary | ICD-10-CM | POA: Insufficient documentation

## 2024-01-14 DIAGNOSIS — I10 Essential (primary) hypertension: Secondary | ICD-10-CM | POA: Diagnosis present

## 2024-01-14 DIAGNOSIS — Z85118 Personal history of other malignant neoplasm of bronchus and lung: Secondary | ICD-10-CM

## 2024-01-14 DIAGNOSIS — Z1152 Encounter for screening for COVID-19: Secondary | ICD-10-CM

## 2024-01-14 DIAGNOSIS — F1490 Cocaine use, unspecified, uncomplicated: Secondary | ICD-10-CM | POA: Diagnosis present

## 2024-01-14 DIAGNOSIS — J440 Chronic obstructive pulmonary disease with acute lower respiratory infection: Secondary | ICD-10-CM | POA: Diagnosis present

## 2024-01-14 DIAGNOSIS — F17210 Nicotine dependence, cigarettes, uncomplicated: Secondary | ICD-10-CM | POA: Diagnosis present

## 2024-01-14 LAB — CBC WITH DIFFERENTIAL/PLATELET
Abs Immature Granulocytes: 0.01 10*3/uL (ref 0.00–0.07)
Basophils Absolute: 0 10*3/uL (ref 0.0–0.1)
Basophils Relative: 0 %
Eosinophils Absolute: 0.2 10*3/uL (ref 0.0–0.5)
Eosinophils Relative: 5 %
HCT: 40.6 % (ref 39.0–52.0)
Hemoglobin: 12.6 g/dL — ABNORMAL LOW (ref 13.0–17.0)
Immature Granulocytes: 0 %
Lymphocytes Relative: 29 %
Lymphs Abs: 1.4 10*3/uL (ref 0.7–4.0)
MCH: 29.6 pg (ref 26.0–34.0)
MCHC: 31 g/dL (ref 30.0–36.0)
MCV: 95.5 fL (ref 80.0–100.0)
Monocytes Absolute: 0.9 10*3/uL (ref 0.1–1.0)
Monocytes Relative: 17 %
Neutro Abs: 2.3 10*3/uL (ref 1.7–7.7)
Neutrophils Relative %: 49 %
Platelets: 209 10*3/uL (ref 150–400)
RBC: 4.25 MIL/uL (ref 4.22–5.81)
RDW: 12.6 % (ref 11.5–15.5)
WBC: 4.9 10*3/uL (ref 4.0–10.5)
nRBC: 0 % (ref 0.0–0.2)

## 2024-01-14 LAB — COMPREHENSIVE METABOLIC PANEL WITH GFR
ALT: 10 U/L (ref 0–44)
AST: 14 U/L — ABNORMAL LOW (ref 15–41)
Albumin: 3.7 g/dL (ref 3.5–5.0)
Alkaline Phosphatase: 83 U/L (ref 38–126)
Anion gap: 6 (ref 5–15)
BUN: 9 mg/dL (ref 8–23)
CO2: 35 mmol/L — ABNORMAL HIGH (ref 22–32)
Calcium: 9 mg/dL (ref 8.9–10.3)
Chloride: 102 mmol/L (ref 98–111)
Creatinine, Ser: 0.62 mg/dL (ref 0.61–1.24)
GFR, Estimated: >60 mL/min (ref >60)
Glucose, Bld: 106 mg/dL — ABNORMAL HIGH (ref 70–99)
Potassium: 3.6 mmol/L (ref 3.5–5.1)
Sodium: 143 mmol/L (ref 135–145)
Total Bilirubin: 0.4 mg/dL (ref 0.0–1.2)
Total Protein: 6.9 g/dL (ref 6.5–8.1)

## 2024-01-14 LAB — RESP PANEL BY RT-PCR (RSV, FLU A&B, COVID)  RVPGX2
Influenza A by PCR: NEGATIVE
Influenza B by PCR: NEGATIVE
Resp Syncytial Virus by PCR: NEGATIVE
SARS Coronavirus 2 by RT PCR: NEGATIVE

## 2024-01-14 LAB — BRAIN NATRIURETIC PEPTIDE: B Natriuretic Peptide: 80 pg/mL (ref 0.0–100.0)

## 2024-01-14 LAB — TROPONIN I (HIGH SENSITIVITY): Troponin I (High Sensitivity): 6 ng/L (ref <18)

## 2024-01-14 LAB — LACTIC ACID, PLASMA: Lactic Acid, Venous: 0.9 mmol/L (ref 0.5–1.9)

## 2024-01-14 MED ORDER — ALBUTEROL SULFATE (2.5 MG/3ML) 0.083% IN NEBU
INHALATION_SOLUTION | RESPIRATORY_TRACT | Status: AC
  Administered 2024-01-14: 10 mg
  Filled 2024-01-14: qty 12

## 2024-01-14 MED ORDER — SODIUM CHLORIDE 0.9 % IV SOLN
2.0000 g | INTRAVENOUS | Status: DC
  Administered 2024-01-15 – 2024-01-16 (×3): 2 g via INTRAVENOUS
  Filled 2024-01-14 (×3): qty 20

## 2024-01-14 MED ORDER — MAGNESIUM HYDROXIDE 400 MG/5ML PO SUSP: 30.0000 mL | Freq: Every day | ORAL | Status: DC | PRN

## 2024-01-14 MED ORDER — ACETAMINOPHEN 325 MG PO TABS: 650.0000 mg | ORAL_TABLET | Freq: Four times a day (QID) | ORAL | Status: DC | PRN

## 2024-01-14 MED ORDER — ALBUTEROL (5 MG/ML) CONTINUOUS INHALATION SOLN
10.0000 mg | INHALATION_SOLUTION | RESPIRATORY_TRACT | Status: AC
  Filled 2024-01-14: qty 5

## 2024-01-14 MED ORDER — FUROSEMIDE 20 MG PO TABS: 20.0000 mg | ORAL_TABLET | Freq: Every day | ORAL | Status: DC | PRN

## 2024-01-14 MED ORDER — DOXYCYCLINE HYCLATE 100 MG PO TABS: 100.0000 mg | ORAL_TABLET | Freq: Once | ORAL | Status: DC

## 2024-01-14 MED ORDER — SODIUM CHLORIDE 0.9 % IV SOLN
500.0000 mg | INTRAVENOUS | Status: DC
  Administered 2024-01-15 – 2024-01-17 (×3): 500 mg via INTRAVENOUS
  Filled 2024-01-14 (×3): qty 5

## 2024-01-14 MED ORDER — ACETAMINOPHEN 650 MG RE SUPP: 650.0000 mg | Freq: Four times a day (QID) | RECTAL | Status: DC | PRN

## 2024-01-14 MED ORDER — GUAIFENESIN ER 600 MG PO TB12
600.0000 mg | ORAL_TABLET | Freq: Two times a day (BID) | ORAL | Status: DC
  Administered 2024-01-15 – 2024-01-17 (×6): 600 mg via ORAL
  Filled 2024-01-14 (×6): qty 1

## 2024-01-14 MED ORDER — TRAZODONE HCL 50 MG PO TABS
25.0000 mg | ORAL_TABLET | Freq: Every evening | ORAL | Status: DC | PRN
  Administered 2024-01-15: 25 mg via ORAL
  Filled 2024-01-14: qty 1

## 2024-01-14 MED ORDER — IPRATROPIUM-ALBUTEROL 0.5-2.5 (3) MG/3ML IN SOLN
3.0000 mL | Freq: Once | RESPIRATORY_TRACT | Status: AC
Start: 2024-01-14 — End: 2024-01-14
  Administered 2024-01-14: 3 mL via RESPIRATORY_TRACT
  Filled 2024-01-14: qty 3

## 2024-01-14 MED ORDER — ONDANSETRON HCL 4 MG PO TABS: 4.0000 mg | ORAL_TABLET | Freq: Four times a day (QID) | ORAL | Status: DC | PRN

## 2024-01-14 MED ORDER — METHYLPREDNISOLONE SODIUM SUCC 125 MG IJ SOLR
125.0000 mg | Freq: Once | INTRAMUSCULAR | Status: AC
  Administered 2024-01-14: 125 mg via INTRAVENOUS
  Filled 2024-01-14: qty 2

## 2024-01-14 MED ORDER — OXYCODONE HCL 5 MG PO TABS
15.0000 mg | ORAL_TABLET | ORAL | Status: DC | PRN
  Administered 2024-01-15 – 2024-01-17 (×3): 15 mg via ORAL
  Filled 2024-01-14 (×3): qty 3

## 2024-01-14 MED ORDER — METHYLPREDNISOLONE SODIUM SUCC 40 MG IJ SOLR
40.0000 mg | Freq: Two times a day (BID) | INTRAMUSCULAR | Status: DC
  Administered 2024-01-15 – 2024-01-17 (×5): 40 mg via INTRAVENOUS
  Filled 2024-01-14 (×5): qty 1

## 2024-01-14 MED ORDER — IPRATROPIUM-ALBUTEROL 0.5-2.5 (3) MG/3ML IN SOLN
3.0000 mL | Freq: Four times a day (QID) | RESPIRATORY_TRACT | Status: DC
  Administered 2024-01-15 – 2024-01-17 (×10): 3 mL via RESPIRATORY_TRACT
  Filled 2024-01-14 (×8): qty 3

## 2024-01-14 MED ORDER — PANTOPRAZOLE SODIUM 40 MG PO TBEC
40.0000 mg | DELAYED_RELEASE_TABLET | Freq: Every day | ORAL | Status: DC
  Administered 2024-01-15 – 2024-01-17 (×3): 40 mg via ORAL
  Filled 2024-01-14 (×3): qty 1

## 2024-01-14 MED ORDER — CLONAZEPAM 0.5 MG PO TABS
1.0000 mg | ORAL_TABLET | Freq: Two times a day (BID) | ORAL | Status: DC | PRN
  Administered 2024-01-15 – 2024-01-17 (×3): 1 mg via ORAL
  Filled 2024-01-14 (×3): qty 2

## 2024-01-14 MED ORDER — HYDROCOD POLI-CHLORPHE POLI ER 10-8 MG/5ML PO SUER: 5.0000 mL | Freq: Two times a day (BID) | ORAL | Status: DC | PRN

## 2024-01-14 MED ORDER — ENOXAPARIN SODIUM 40 MG/0.4ML IJ SOSY
40.0000 mg | PREFILLED_SYRINGE | INTRAMUSCULAR | Status: DC
  Administered 2024-01-15 – 2024-01-17 (×3): 40 mg via SUBCUTANEOUS
  Filled 2024-01-14 (×3): qty 0.4

## 2024-01-14 MED ORDER — ONDANSETRON HCL 4 MG/2ML IJ SOLN: 4.0000 mg | Freq: Four times a day (QID) | INTRAMUSCULAR | Status: DC | PRN

## 2024-01-14 MED ORDER — SODIUM CHLORIDE 0.9 % IV SOLN: INTRAVENOUS | Status: AC

## 2024-01-14 NOTE — ED Provider Notes (Signed)
 [place] EMERGENCY DEPARTMENT AT [facility] Provider Note   CSN: [id number] Arrival date & time: 01/14/24  2110     History  Chief Complaint  Patient presents with   Shortness of Breath    Miguel Marsh is a 63 y.o. male.   Shortness of Breath  This patient is a 63 year old male, he has a history of severe COPD, he wears oxygen chronically at home at 2 or 3 L 24 hours a day and he also has a problem with cocaine last using about 3 days ago.  He reports over the last couple of days he has had increasing shortness of breath, wheezing and on arrival the patient has pursed lip breathing secondary to respiratory distress.  He has no fevers or chills though he states that last night he woke up sweaty.  He has no swelling of his legs, states he does have a cough but this is a chronic cough and nothing is different.  He had taken 2 nebulized treatments at home prior to arrival.  Paramedics gave him additional nebulizer, he states that is not really helped that much.    Home Medications Prior to Admission medications   Medication Sig Start Date End Date Taking? Authorizing Provider  albuterol (PROVENTIL) (2.5 MG/3ML) 0.083% nebulizer solution Inhale 3 mLs (2.5 mg total) into the lungs every 6 (six) hours as needed for wheezing or shortness of breath. 11/01/23   Shon Hale, MD  albuterol (VENTOLIN HFA) 108 (90 Base) MCG/ACT inhaler Inhale 2 puffs into the lungs every 6 (six) hours as needed. 11/01/23   Shon Hale, MD  BREZTRI AEROSPHERE 160-9-4.8 MCG/ACT AERO Inhale 2 puffs into the lungs 2 (two) times daily. 11/01/23   Shon Hale, MD  clonazePAM (KLONOPIN) 1 MG tablet Take 1 tablet (1 mg total) by mouth 2 (two) times daily as needed for anxiety. 11/01/23   Shon Hale, MD  furosemide (LASIX) 40 MG tablet Take 0.5 tablets (20 mg total) by mouth See admin instructions. Take once a day  as needed for fluid/swelling 11/01/23   Shon Hale, MD  omeprazole (PRILOSEC)  40 MG capsule Take 1 capsule (40 mg total) by mouth daily. 11/01/23   Shon Hale, MD  oxyCODONE (ROXICODONE) 15 MG immediate release tablet Take 15 mg by mouth every 4 (four) hours as needed for pain.    [provider]  OXYGEN Inhale 3 L into the lungs continuous.     [provider]      Allergies    Ibuprofen    Review of Systems   Review of Systems  Respiratory:  Positive for shortness of breath.   All other systems reviewed and are negative.   Physical Exam Updated Vital Signs BP (!) 160/82   Pulse 94   Temp 98 F (36.7 C) (Oral)   Resp 20   Ht 1.702 m (5\' 7" )   Wt 76.2 kg   SpO2 93%   BMI 26.31 kg/m  Physical Exam Vitals and nursing note reviewed.  Constitutional:      General: He is in acute distress.     Appearance: He is well-developed. He is ill-appearing.  HENT:     Head: Normocephalic and atraumatic.     Mouth/Throat:     Pharynx: No oropharyngeal exudate.  Eyes:     General: No scleral icterus.       Right eye: No discharge.        Left eye: No discharge.     Conjunctiva/sclera:  Conjunctivae normal.     Pupils: Pupils are equal, round, and reactive to light.  Neck:     Thyroid: No thyromegaly.     Vascular: No JVD.  Cardiovascular:     Rate and Rhythm: Normal rate and regular rhythm.     Heart sounds: Normal heart sounds. No murmur heard.    No friction rub. No gallop.  Pulmonary:     Effort: Respiratory distress present.     Breath sounds: Wheezing and rhonchi present. No rales.  Abdominal:     General: Bowel sounds are normal. There is no distension.     Palpations: Abdomen is soft. There is no mass.     Tenderness: There is no abdominal tenderness.  Musculoskeletal:        General: No tenderness. Normal range of motion.     Cervical back: Normal range of motion and neck supple.  Lymphadenopathy:     Cervical: No cervical adenopathy.  Skin:    General: Skin is warm and dry.     Findings: No erythema or rash.   Neurological:     Mental Status: He is alert.     Coordination: Coordination normal.  Psychiatric:        Behavior: Behavior normal.     ED Results / Procedures / Treatments   Labs (all labs ordered are listed, but only abnormal results are displayed) Labs Reviewed  CBC WITH DIFFERENTIAL/PLATELET - Abnormal; Notable for the following components:      Result Value   Hemoglobin 12.6 (*)    All other components within normal limits  COMPREHENSIVE METABOLIC PANEL - Abnormal; Notable for the following components:   CO2 35 (*)    Glucose, Bld 106 (*)    AST 14 (*)    All other components within normal limits  RESP PANEL BY RT-PCR (RSV, FLU A&B, COVID)  [id number]  BRAIN NATRIURETIC PEPTIDE  LACTIC ACID, PLASMA  TROPONIN I (HIGH SENSITIVITY)    EKG EKG Interpretation Date/Time:  Monday January 14 2024 21:30:44 EDT Ventricular Rate:  84 PR Interval:  133 QRS Duration:  87 QT Interval:  350 QTC Calculation: 417 R Axis:   2  Text Interpretation: Sinus rhythm Confirmed by Eber Hong ([id number]) on 01/14/2024 9:36:04 PM  Radiology DG Chest Port 1 View Result Date: 01/14/2024 CLINICAL DATA:  Cough and shortness of breath. EXAM: PORTABLE CHEST 1 VIEW COMPARISON:  December 04, 2023 FINDINGS: The heart size and mediastinal contours are within normal limits. Surgical clips are seen overlying the superior mediastinum on the left. Mild atelectasis and/or infiltrate is seen within the mid right lung and bilateral lung bases. No pleural effusion or pneumothorax is identified. The visualized skeletal structures are unremarkable. IMPRESSION: Mild mid right lung and bibasilar atelectasis and/or infiltrate. Electronically Signed   By: Aram Candela M.D.   On: 01/14/2024 22:15    Procedures .Critical Care  Performed by: Eber Hong, MD Authorized by: Eber Hong, MD   Critical care provider statement:    Critical care time (minutes):  45   Critical care time was exclusive of:  Separately  billable procedures and treating other patients   Critical care was necessary to treat or prevent imminent or life-threatening deterioration of the following conditions:  Respiratory failure   Critical care was time spent personally by me on the following activities:  Development of treatment plan with patient or surrogate, discussions with consultants, evaluation of patient's response to treatment, examination of patient, obtaining history from patient or surrogate, review  of old charts, re-evaluation of patient's condition, pulse oximetry, ordering and review of radiographic studies, ordering and review of laboratory studies and ordering and performing treatments and interventions   I assumed direction of critical care for this patient from another provider in my specialty: no     Care discussed with: admitting provider   Comments:           Medications Ordered in ED Medications  albuterol (PROVENTIL,VENTOLIN) solution continuous neb (10 mg Nebulization Not Given 01/14/24 2201)  doxycycline (VIBRA-TABS) tablet 100 mg (has no administration in time range)  ipratropium-albuterol (DUONEB) 0.5-2.5 (3) MG/3ML nebulizer solution 3 mL (3 mLs Nebulization Given 01/14/24 2200)  methylPREDNISolone sodium succinate (SOLU-MEDROL) 125 mg/2 mL injection 125 mg (125 mg Intravenous Given 01/14/24 2155)  albuterol (PROVENTIL) (2.5 MG/3ML) 0.083% nebulizer solution (10 mg  Given 01/14/24 2200)    ED Course/ Medical Decision Making/ A&P                                 Medical Decision Making Amount and/or Complexity of Data Reviewed Labs: ordered. Radiology: ordered.  Risk Prescription drug management. Decision regarding hospitalization.    This patient presents to the ED for concern of shortness of breath, this involves an extensive number of treatment options, and is a complaint that carries with it a high risk of complications and morbidity.  The differential diagnosis includes COPD exacerbation,  hypoxic respiratory failure, pneumonia, COVID, flu, pneumothorax, pleural effusion, cocaine related shortness of breath   Co morbidities that complicate the patient evaluation  Cocaine use, severe COPD oxygen dependent   Additional history obtained:  Additional history obtained from medical record External records from outside source obtained and reviewed including recent admission to the hospital approximately 1 month ago for shortness of breath, admitted in December for acute on chronic respiratory failure   Lab Tests:  I Ordered, and personally interpreted labs.  The pertinent results include: Lactic acid is normal, CBC is normal, metabolic panel is unremarkable except for a CO2 of 35, troponin is normal and BNP is 80   Imaging Studies ordered:  I ordered imaging studies including x-ray of the chest I independently visualized and interpreted imaging which showed mid right lung and bibasilar atelectasis or infiltrate I agree with the radiologist interpretation   Cardiac Monitoring: / EKG:  The patient was maintained on a cardiac monitor.  I personally viewed and interpreted the cardiac monitored which showed an underlying rhythm of: Normal sinus rhythm   Consultations Obtained:  I requested consultation with the hospitalist,  and discussed lab and imaging findings as well as pertinent plan - they recommend: Admission to the hospital   Problem List / ED Course / Critical interventions / Medication management  Patient given continuous nebulizer treatments with bronchodilator therapy, Solu-Medrol and will start antibiotics.  Discussed with the hospitalist for admission as the patient even after this still has ongoing respiratory distress although his oxygenation is 99 to 100% he has pursed lip breathing and in distress, no signs of pneumothorax   Social Determinants of Health:  Chronic drug use, chronic lung disease   Test / Admission - Considered:  Admit to higher  level of care         Final Clinical Impression(s) / ED Diagnoses Final diagnoses:  COPD exacerbation (HCC)    Rx / DC Orders ED Discharge Orders     None  Eber Hong, MD 01/14/24 [phone]

## 2024-01-14 NOTE — H&P (Signed)
 [place]   PATIENT NAME: Miguel Marsh    MR#:  [id number]  DATE OF BIRTH:  [birth year]  DATE OF ADMISSION:  01/14/2024  PRIMARY CARE PHYSICIAN: [name], NP   Patient is coming from: Home  REQUESTING/REFERRING PHYSICIAN: Eber Hong, MD  CHIEF COMPLAINT:   Chief Complaint  Patient presents with   Shortness of Breath    HISTORY OF PRESENT ILLNESS:  Miguel Marsh is a 63 y.o.African-American male with medical history significant for osteoarthritis, COPD and hypertension, and metastatic non-squamous cell lung cancer, who presented to the emergency room with acute onset of worsening cough with associated expectoration of yellowish and greenish sputum as well as dyspnea and occasional wheezing over the last 4 days.  He denies any fever or chills.  No nausea or vomiting or abdominal pain.  No chest pain or palpitations.  He smokes 1 pack of cigarettes per every couple weeks.  No dysuria, oliguria or hematuria or flank pain.  ED Course: When he came to the ER, BP was 159/81 with pulse oximetry of 96% on 3 L of O2 by nasal cannula and otherwise normal vital signs.  CBC showed hemoglobin of 12.6 and hematocrit 40.6.  Lactic acid was 0.9.  CMP revealed the CO2 of 35 and glucose of 106 with otherwise unremarkable values.  Respiratory panel came back negative. EKG as reviewed by me : EKG showed sinus rhythm with a rate of 91 with PACs and nonspecific IVCD with LAD. Imaging: Portable chest x-ray showed mild right midlung and bibasilar atelectasis and/or infiltrate.  The patient was given DuoNebs, 125 mg IV Solu-Medrol, 10 mg of continuous nebulizer butyryl and was ordered p.o. doxycycline 100 mg.  He will be admitted to the medical telemetry bed for further evaluation and management. PAST MEDICAL HISTORY:   Past Medical History:  Diagnosis Date   Arthritis    Bronchitis    Cancer (HCC)    metastatic NSCLC (followed by [facility])   COPD (chronic obstructive pulmonary  disease) (HCC)    HTN (hypertension)     PAST SURGICAL HISTORY:   Past Surgical History:  Procedure Laterality Date   LUNG BIOPSY     TOTAL HIP ARTHROPLASTY     TUMOR REMOVAL Right 12/04/2017    SOCIAL HISTORY:   Social History   Tobacco Use   Smoking status: Former    Current packs/day: 0.10    Types: Cigarettes   Smokeless tobacco: Never  Substance Use Topics   Alcohol use: Yes    Comment: occ    FAMILY HISTORY:   Positive for diabetes mellitus.  DRUG ALLERGIES:   Allergies  Allergen Reactions   Ibuprofen Shortness Of Breath    REVIEW OF SYSTEMS:   ROS As per history of present illness. All pertinent systems were reviewed above. Constitutional, HEENT, cardiovascular, respiratory, GI, GU, musculoskeletal, neuro, psychiatric, endocrine, integumentary and hematologic systems were reviewed and are otherwise negative/unremarkable except for positive findings mentioned above in the HPI.   MEDICATIONS AT HOME:   Prior to Admission medications   Medication Sig Start Date End Date Taking? Authorizing Provider  albuterol (PROVENTIL) (2.5 MG/3ML) 0.083% nebulizer solution Inhale 3 mLs (2.5 mg total) into the lungs every 6 (six) hours as needed for wheezing or shortness of breath. 11/01/23   Shon Hale, MD  albuterol (VENTOLIN HFA) 108 (90 Base) MCG/ACT inhaler Inhale 2 puffs into the lungs every 6 (six) hours as needed. 11/01/23   Shon Hale, MD  Markus Daft AEROSPHERE  160-9-4.8 MCG/ACT AERO Inhale 2 puffs into the lungs 2 (two) times daily. 11/01/23   Shon Hale, MD  clonazePAM (KLONOPIN) 1 MG tablet Take 1 tablet (1 mg total) by mouth 2 (two) times daily as needed for anxiety. 11/01/23   Shon Hale, MD  furosemide (LASIX) 40 MG tablet Take 0.5 tablets (20 mg total) by mouth See admin instructions. Take once a day  as needed for fluid/swelling 11/01/23   Shon Hale, MD  omeprazole (PRILOSEC) 40 MG capsule Take 1 capsule (40 mg total) by mouth daily. 11/01/23    Shon Hale, MD  oxyCODONE (ROXICODONE) 15 MG immediate release tablet Take 15 mg by mouth every 4 (four) hours as needed for pain.    [provider]  OXYGEN Inhale 3 L into the lungs continuous.     [provider]      VITAL SIGNS:  Blood pressure 122/74, pulse 63, temperature 98.2 F (36.8 C), resp. rate 16, height 5\' 7"  (1.702 m), weight 76.2 kg, SpO2 100%.  PHYSICAL EXAMINATION:  Physical Exam  GENERAL:  63 y.o.-year-old African-American male patient lying in the bed with  mild respiratory distress with conversational dyspnea EYES: Pupils equal, round, reactive to light and accommodation. No scleral icterus. Extraocular muscles intact.  HEENT: Head atraumatic, normocephalic. Oropharynx and nasopharynx clear.  NECK:  Supple, no jugular venous distention. No thyroid enlargement, no tenderness.  LUNGS: Diminished right basal breath sounds with right basal crackles and diffuse expiratory wheezes with tight expiratory airflow and harsh vesicular breathing no use of accessory muscles of respiration.  CARDIOVASCULAR: Regular rate and rhythm, S1, S2 normal. No murmurs, rubs, or gallops.  ABDOMEN: Soft, nondistended, nontender. Bowel sounds present. No organomegaly or mass.  EXTREMITIES: No pedal edema, cyanosis, or clubbing.  NEUROLOGIC: Cranial nerves II through XII are intact. Muscle strength 5/5 in all extremities. Sensation intact. Gait not checked.  PSYCHIATRIC: The patient is alert and oriented x 3.  Normal affect and good eye contact. SKIN: No obvious rash, lesion, or ulcer.   LABORATORY PANEL:   CBC Recent Labs  Lab 01/15/24 0516  WBC 5.7  HGB 11.8*  HCT 37.6*  PLT 194   ------------------------------------------------------------------------------------------------------------------  Chemistries  Recent Labs  Lab 01/14/24 2129 01/15/24 0516  NA 143 141  K 3.6 4.4  CL 102 103  CO2 35* 31  GLUCOSE 106* 139*  BUN 9 8  CREATININE 0.62 0.56*   CALCIUM 9.0 8.6*  AST 14*  --   ALT 10  --   ALKPHOS 83  --   BILITOT 0.4  --    ------------------------------------------------------------------------------------------------------------------  Cardiac Enzymes No results for input(s): "TROPONINI" in the last 168 hours. ------------------------------------------------------------------------------------------------------------------  RADIOLOGY:  DG Chest Port 1 View Result Date: 01/14/2024 CLINICAL DATA:  Cough and shortness of breath. EXAM: PORTABLE CHEST 1 VIEW COMPARISON:  December 04, 2023 FINDINGS: The heart size and mediastinal contours are within normal limits. Surgical clips are seen overlying the superior mediastinum on the left. Mild atelectasis and/or infiltrate is seen within the mid right lung and bilateral lung bases. No pleural effusion or pneumothorax is identified. The visualized skeletal structures are unremarkable. IMPRESSION: Mild mid right lung and bibasilar atelectasis and/or infiltrate. Electronically Signed   By: Aram Candela M.D.   On: 01/14/2024 22:15      IMPRESSION AND PLAN:  Assessment and Plan: * COPD exacerbation (HCC) - The patient will be admitted to a medically monitored bed. - We will place the patient IV steroid therapy with IV  Solu-Medrol as well as nebulized bronchodilator therapy with duonebs q.i.d. and q.4 hours p.r.n.Marland Kitchen - Mucolytic therapy will be provided with Mucinex and antibiotic therapy with IV Rocephin and Zithromax. - O2 protocol will be followed.   CAP (community acquired pneumonia) - This is likely the culprit for 1. - Management as above.  GERD without esophagitis - Continue PPI therapy.  Anxiety - We will continue Klonopin.       DVT prophylaxis: Lovenox.  Advanced Care Planning:  Code Status: full code.  Family Communication:  The plan of care was discussed in details with the patient (and family). I answered all questions. The patient agreed to proceed with the  above mentioned plan. Further management will depend upon hospital course. Disposition Plan: Back to previous home environment Consults called: none.  All the records are reviewed and case discussed with ED provider.  Status is: Inpatient    At the time of the admission, it appears that the appropriate admission status for this patient is inpatient.  This is judged to be reasonable and necessary in order to provide the required intensity of service to ensure the patient's safety given the presenting symptoms, physical exam findings and initial radiographic and laboratory data in the context of comorbid conditions.  The patient requires inpatient status due to high intensity of service, high risk of further deterioration and high frequency of surveillance required.  I certify that at the time of admission, it is my clinical judgment that the patient will require inpatient hospital care extending more than 2 midnights.                            Dispo: The patient is from: Home              Anticipated d/c is to: Home              Patient currently is not medically stable to d/c.              Difficult to place patient: No  Hannah Beat M.D on 01/15/2024 at 6:04 AM  [facility]   From 7 PM-7 AM, contact night-coverage [email or web address]  CC: Primary care physician; [name], NP

## 2024-01-14 NOTE — ED Triage Notes (Signed)
 Pt complaining of shortness of breath that started about an hour ago. Is normally on 3 ltrs of home o2 and he took a breathing treatment at home. Ems gave a duoneb enroute and put him on 4 ltrs. York Spaniel that he has had a cough for the last 3-4 days as well.

## 2024-01-15 DIAGNOSIS — J189 Pneumonia, unspecified organism: Secondary | ICD-10-CM | POA: Diagnosis not present

## 2024-01-15 DIAGNOSIS — K219 Gastro-esophageal reflux disease without esophagitis: Secondary | ICD-10-CM | POA: Diagnosis not present

## 2024-01-15 DIAGNOSIS — F419 Anxiety disorder, unspecified: Secondary | ICD-10-CM | POA: Insufficient documentation

## 2024-01-15 DIAGNOSIS — J441 Chronic obstructive pulmonary disease with (acute) exacerbation: Secondary | ICD-10-CM | POA: Diagnosis not present

## 2024-01-15 LAB — CBC
HCT: 37.6 % — ABNORMAL LOW (ref 39.0–52.0)
Hemoglobin: 11.8 g/dL — ABNORMAL LOW (ref 13.0–17.0)
MCH: 30 pg (ref 26.0–34.0)
MCHC: 31.4 g/dL (ref 30.0–36.0)
MCV: 95.7 fL (ref 80.0–100.0)
Platelets: 194 10*3/uL (ref 150–400)
RBC: 3.93 MIL/uL — ABNORMAL LOW (ref 4.22–5.81)
RDW: 12.2 % (ref 11.5–15.5)
WBC: 5.7 10*3/uL (ref 4.0–10.5)
nRBC: 0 % (ref 0.0–0.2)

## 2024-01-15 LAB — BASIC METABOLIC PANEL
Anion gap: 7 (ref 5–15)
BUN: 8 mg/dL (ref 8–23)
CO2: 31 mmol/L (ref 22–32)
Calcium: 8.6 mg/dL — ABNORMAL LOW (ref 8.9–10.3)
Chloride: 103 mmol/L (ref 98–111)
Creatinine, Ser: 0.56 mg/dL — ABNORMAL LOW (ref 0.61–1.24)
GFR, Estimated: >60 mL/min (ref >60)
Glucose, Bld: 139 mg/dL — ABNORMAL HIGH (ref 70–99)
Potassium: 4.4 mmol/L (ref 3.5–5.1)
Sodium: 141 mmol/L (ref 135–145)

## 2024-01-15 MED ORDER — ORAL CARE MOUTH RINSE: 15.0000 mL | OROMUCOSAL | Status: DC | PRN

## 2024-01-15 NOTE — ED Notes (Signed)
 Attempted to call report to [phone]

## 2024-01-15 NOTE — Progress Notes (Signed)
 PROGRESS NOTE   Miguel Marsh  GMW:[id number] DOB: 08/13/1961 DOA: 01/14/2024 PCP: [name], NP   Chief Complaint  Patient presents with   Shortness of Breath   Level of care: Telemetry  Brief Admission History:  63 year old male with hypertension, metastatic non-squamous cell lung cancer, osteoarthritis and COPD presented to the emergency department with 4 days of increasing yellow-greenish sputum production shortness of breath wheezing.  He continues to smoke cigarettes.  He was treated in the ED but did not recover to baseline and admission was requested for further management of acute COPD exacerbation.   Assessment and Plan:  COPD with acute exacerbation - failed outpatient management  - inpatient admission for IV steroids, scheduled bronchodilators - Mucolytic therapy will be provided with Mucinex and antibiotic therapy with IV Rocephin and Zithromax. - O2 protocol will be followed.  GERD without esophagitis - Continue PPI therapy.  Anxiety - Continue home clonazepam.  CAP (community acquired pneumonia) - IV steroids and antibiotics and supportive measures as above    DVT prophylaxis: enoxaparin Code Status: Full  Family Communication:  Disposition:    Consultants:   Procedures:   Antimicrobials:  Ceftriaxone>> Azithromycin>> Subjective: The patient reports he feels very short of breath with wheezing chest congestion and sputum production. Objective: Vitals:   01/15/24 1108 01/15/24 1229 01/15/24 1523 01/15/24 1730  BP:  130/62  128/68  Pulse:  83  77  Resp:      Temp:  97.6 F (36.4 C)  97.8 F (36.6 C)  TempSrc:  Oral  Oral  SpO2: 98% 99% 99% 100%  Weight:      Height:        Intake/Output Summary (Last 24 hours) at 01/15/2024 1809 Last data filed at 01/15/2024 1731 Gross per 24 hour  Intake 420 ml  Output 700 ml  Net -280 ml   Filed Weights   01/14/24 2127  Weight: 76.2 kg   Examination:  General exam: Appears moderately  distressed speaking in short sentences with audible wheezing Respiratory system: Diffuse inspiratory and expiratory wheezing heard with Rales left lower lobe heard posteriorly Cardiovascular system: normal S1 & S2 heard. No JVD, murmurs, rubs, gallops or clicks. No pedal edema. Gastrointestinal system: Abdomen is nondistended, soft and nontender. No organomegaly or masses felt. Normal bowel sounds heard. Central nervous system: Alert and oriented. No focal neurological deficits. Extremities: Symmetric 5 x 5 power. Skin: No rashes, lesions or ulcers. Psychiatry: Judgement and insight appear normal. Mood & affect appropriate.   Data Reviewed: I have personally reviewed following labs and imaging studies  CBC: Recent Labs  Lab 01/14/24 2129 01/15/24 0516  WBC 4.9 5.7  NEUTROABS 2.3  --   HGB 12.6* 11.8*  HCT 40.6 37.6*  MCV 95.5 95.7  PLT 209 194    Basic Metabolic Panel: Recent Labs  Lab 01/14/24 2129 01/15/24 0516  NA 143 141  K 3.6 4.4  CL 102 103  CO2 35* 31  GLUCOSE 106* 139*  BUN 9 8  CREATININE 0.62 0.56*  CALCIUM 9.0 8.6*    CBG: No results for input(s): "GLUCAP" in the last 168 hours.  Recent Results (from the past 240 hours)  Resp panel by RT-PCR (RSV, Flu A&B, Covid) Anterior Nasal Swab     Status: None   Collection Time: 01/14/24 10:47 PM   Specimen: Anterior Nasal Swab  Result Value Ref Range Status   SARS Coronavirus 2 by RT PCR NEGATIVE NEGATIVE Final    Comment: (NOTE) SARS-CoV-2 target  nucleic acids are NOT DETECTED.  The SARS-CoV-2 RNA is generally detectable in upper respiratory specimens during the acute phase of infection. The lowest concentration of SARS-CoV-2 viral copies this assay can detect is 138 copies/mL. A negative result does not preclude SARS-Cov-2 infection and should not be used as the sole basis for treatment or other patient management decisions. A negative result may occur with  improper specimen collection/handling,  submission of specimen other than nasopharyngeal swab, presence of viral mutation(s) within the areas targeted by this assay, and inadequate number of viral copies(<138 copies/mL). A negative result must be combined with clinical observations, patient history, and epidemiological information. The expected result is Negative.  Fact Sheet for Patients:  BloggerCourse.com  Fact Sheet for Healthcare Providers:  SeriousBroker.it  This test is no t yet approved or cleared by the [place] FDA and  has been authorized for detection and/or diagnosis of SARS-CoV-2 by FDA under an Emergency Use Authorization (EUA). This EUA will remain  in effect (meaning this test can be used) for the duration of the COVID-19 declaration under Section 564(b)(1) of the Act, 21 U.S.C.section 360bbb-3(b)(1), unless the authorization is terminated  or revoked sooner.       Influenza A by PCR NEGATIVE NEGATIVE Final   Influenza B by PCR NEGATIVE NEGATIVE Final    Comment: (NOTE) The Xpert Xpress SARS-CoV-2/FLU/RSV plus assay is intended as an aid in the diagnosis of influenza from Nasopharyngeal swab specimens and should not be used as a sole basis for treatment. Nasal washings and aspirates are unacceptable for Xpert Xpress SARS-CoV-2/FLU/RSV testing.  Fact Sheet for Patients: BloggerCourse.com  Fact Sheet for Healthcare Providers: SeriousBroker.it  This test is not yet approved or cleared by the [place] FDA and has been authorized for detection and/or diagnosis of SARS-CoV-2 by FDA under an Emergency Use Authorization (EUA). This EUA will remain in effect (meaning this test can be used) for the duration of the COVID-19 declaration under Section 564(b)(1) of the Act, 21 U.S.C. section 360bbb-3(b)(1), unless the authorization is terminated or revoked.     Resp Syncytial Virus by PCR NEGATIVE  NEGATIVE Final    Comment: (NOTE) Fact Sheet for Patients: BloggerCourse.com  Fact Sheet for Healthcare Providers: SeriousBroker.it  This test is not yet approved or cleared by the [place] FDA and has been authorized for detection and/or diagnosis of SARS-CoV-2 by FDA under an Emergency Use Authorization (EUA). This EUA will remain in effect (meaning this test can be used) for the duration of the COVID-19 declaration under Section 564(b)(1) of the Act, 21 U.S.C. section 360bbb-3(b)(1), unless the authorization is terminated or revoked.  Performed at [facility], [address], [place], [place] [address]      Radiology Studies: [facility] 1 View Result Date: 01/14/2024 CLINICAL DATA:  Cough and shortness of breath. EXAM: PORTABLE CHEST 1 VIEW COMPARISON:  December 04, 2023 FINDINGS: The heart size and mediastinal contours are within normal limits. Surgical clips are seen overlying the superior mediastinum on the left. Mild atelectasis and/or infiltrate is seen within the mid right lung and bilateral lung bases. No pleural effusion or pneumothorax is identified. The visualized skeletal structures are unremarkable. IMPRESSION: Mild mid right lung and bibasilar atelectasis and/or infiltrate. Electronically Signed   By: Aram Candela M.D.   On: 01/14/2024 22:15    Scheduled Meds:  enoxaparin (LOVENOX) injection  40 mg Subcutaneous Q24H   guaiFENesin  600 mg Oral BID   ipratropium-albuterol  3 mL Nebulization QID  methylPREDNISolone (SOLU-MEDROL) injection  40 mg Intravenous Q12H   pantoprazole  40 mg Oral Daily   Continuous Infusions:  sodium chloride 100 mL/hr at 01/15/24 0022   azithromycin Stopped (01/15/24 0202)   cefTRIAXone (ROCEPHIN)  IV Stopped (01/15/24 0054)     LOS: 1 day   Time spent: 54 mins  Genevieve Arbaugh Laural Benes, MD How to contact the [facility] Attending or Consulting provider 7A - 7P or covering provider during  after hours 7P -7A, for this patient?  Check the care team in [facility] and look for a) attending/consulting TR[facility] provider listed and b) the [facility] team listed Log into [email or web address] to find provider on call.  Locate the [facility] provider you are looking for under [facility] and page to a number that you can be directly reached. If you still have difficulty reaching the provider, please page the [facility] (Director on Call) for the Hospitalists listed on amion for assistance.  01/15/2024, 6:09 PM

## 2024-01-15 NOTE — Assessment & Plan Note (Signed)
 We will continue Klonopin  ?

## 2024-01-15 NOTE — Hospital Course (Signed)
 63 year old male with hypertension, metastatic non-squamous cell lung cancer, osteoarthritis and COPD presented to the emergency department with 4 days of increasing yellow-greenish sputum production shortness of breath wheezing.  He continues to smoke cigarettes.  He was treated in the ED but did not recover to baseline and admission was requested for further management of acute COPD exacerbation.

## 2024-01-15 NOTE — TOC Initial Note (Signed)
 Transition of Care [facility]) - Initial/Assessment Note    Patient Details  Name: Miguel Marsh MRN: [id number] Date of Birth: 30-Jul-1961  [facility]) CM/SW Contact:    Karn Cassis, LCSW Phone Number: 01/15/2024, 8:43 AM  Clinical Narrative: Pt admitted for COPD exacerbation. Assessment completed due to high risk readmission score. Pt reports he lives alone but has a CAP aid several hours a day, 7 days a week. He is on 3L home O2 (unsure on agency). [facility] provides transportation to appointments. Pt plans to return home when medically stable. No needs reported at this time. TOC will follow.                   Expected Discharge Plan: Home/Self Care Barriers to Discharge: Continued Medical Work up   Patient Goals and CMS Choice Patient states their goals for this hospitalization and ongoing recovery are:: return home   Choice offered to / list presented to : Patient [place] ownership interest in [facility].provided to::  (n/a)    Expected Discharge Plan and Services In-house Referral: Clinical Social Work     Living arrangements for the past 2 months: Apartment                                      Prior Living Arrangements/Services Living arrangements for the past 2 months: Apartment Lives with:: Self Patient language and need for interpreter reviewed:: Yes Do you feel safe going back to the place where you live?: Yes      Need for Family Participation in Patient Care: No (Comment)   Current home services: DME (wheelchair, home O2) Criminal Activity/Legal Involvement Pertinent to Current Situation/Hospitalization: No - Comment as needed  Activities of Daily Living      Permission Sought/Granted                  Emotional Assessment     Affect (typically observed): Appropriate Orientation: : Oriented to Self, Oriented to Place, Oriented to  Time, Oriented to Situation Alcohol / Substance Use: Not Applicable Psych  Involvement: No (comment)  Admission diagnosis:  COPD exacerbation (HCC) [J44.1] Patient Active Problem List   Diagnosis Date Noted   Anxiety 01/15/2024   GERD without esophagitis 01/15/2024   Cocaine abuse (HCC) 09/13/2023   CAP (community acquired pneumonia) 06/24/2023   Lung nodule 06/24/2023   COVID-19 virus infection 06/24/2023   Elevated brain natriuretic peptide (BNP) level 03/06/2023   Elevated MCV 03/06/2023   Acute metabolic encephalopathy 02/01/2023   Leukocytosis 10/06/2020   Goals of care, counseling/discussion    Palliative care by specialist    DNR (do not resuscitate) discussion    Hyperglycemia 04/12/2020   Chronic respiratory failure with hypoxia and hypercapnia (HCC) 01/22/2020   Acute respiratory failure (HCC) 01/23/2018   Acute on chronic respiratory failure with hypoxia and hypercapnia (HCC) 01/23/2018   Acute on chronic respiratory failure with hypoxia (HCC) 01/01/2018   Obesity, Class III, BMI 40-49.9 (morbid obesity) (HCC) 01/01/2018   Acute exacerbation of chronic obstructive pulmonary disease (COPD) (HCC) 01/01/2018   Essential hypertension 12/09/2015   Non-small cell cancer of right lung (in remission) 12/09/2015   GERD (gastroesophageal reflux disease) 12/09/2015   Tobacco use disorder 12/17/2014   COPD exacerbation (HCC) 12/17/2014   Obesity 12/17/2014   Dyspnea    Difficulty walking 04/02/2013   Hip weakness 04/02/2013   PCP:  Milinda Cave,  [place], NP Pharmacy:   [facility] [phone] - [place], [place] - [address] [place] [address] Phone: [phone] Fax: [phone]     Social Drivers of Health (SDOH) Social History: SDOH Screenings   Food Insecurity: No Food Insecurity (12/05/2023)  Housing: Patient Declined (12/05/2023)  Transportation Needs: Patient Declined (12/05/2023)  Utilities: Not At Risk (12/05/2023)  Tobacco Use: Medium Risk (01/14/2024)   SDOH  Interventions:     Readmission Risk Interventions    01/15/2024    8:41 AM 10/29/2023    4:27 PM 06/25/2023    9:30 AM  Readmission Risk Prevention Plan  Transportation Screening Complete Complete Complete  HRI or Home Care Consult   Complete  Social Work Consult for Recovery Care Planning/Counseling   Complete  Palliative Care Screening   Not Applicable  Medication Review Oceanographer) Complete Complete Complete  HRI or Home Care Consult Complete Complete   SW Recovery Care/Counseling Consult Complete Complete   Palliative Care Screening Not Applicable Not Applicable   Skilled Nursing Facility Not Applicable Patient Refused

## 2024-01-15 NOTE — Assessment & Plan Note (Signed)
 -  The patient will be admitted to a medically monitored bed. - We will place the patient IV steroid therapy with IV Solu-Medrol as well as nebulized bronchodilator therapy with duonebs q.i.d. and q.4 hours p.r.n.Marland Kitchen - Mucolytic therapy will be provided with Mucinex and antibiotic therapy with IV Rocephin and Zithromax. - O2 protocol will be followed.

## 2024-01-15 NOTE — Assessment & Plan Note (Signed)
 Continue PPI therapy.

## 2024-01-15 NOTE — Plan of Care (Signed)
  Problem: Education: Goal: Knowledge of General Education information will improve Description: Including pain rating scale, medication(s)/side effects and non-pharmacologic comfort measures Outcome: Progressing   Problem: Health Behavior/Discharge Planning: Goal: Ability to manage health-related needs will improve Outcome: Progressing   Problem: Nutrition: Goal: Adequate nutrition will be maintained Outcome: Progressing   Problem: Coping: Goal: Level of anxiety will decrease Outcome: Not Progressing

## 2024-01-15 NOTE — ED Notes (Addendum)
 ED TO INPATIENT HANDOFF REPORT  ED Nurse Name and Phone #: [phone]  S Name/Age/Gender Miguel Marsh 63 y.o. male Room/Bed: APA08/APA08  Code Status   Code Status: Full Code  Home/SNF/Other Home Patient oriented to: self, place, time, and situation Is this baseline? Yes   Triage Complete: Triage complete  Chief Complaint COPD exacerbation (HCC) [J44.1]  Triage Note Pt complaining of shortness of breath that started about an hour ago. Is normally on 3 ltrs of home o2 and he took a breathing treatment at home. Ems gave a duoneb enroute and put him on 4 ltrs. York Spaniel that he has had a cough for the last 3-4 days as well.    Allergies Allergies  Allergen Reactions   Ibuprofen Shortness Of Breath    Level of Care/Admitting Diagnosis ED Disposition     ED Disposition  Admit   Condition  --   Comment  Hospital Area: [facility] [[id number]]  Level of Care: Telemetry [5]  Covid Evaluation: Asymptomatic - no recent exposure (last 10 days) testing not required  Diagnosis: COPD exacerbation [facility]) [[id number]]  Admitting Physician: Hannah Beat [[id number]]  Attending Physician: Hannah Beat [[id number]]  Certification:: I certify this patient will need inpatient services for at least 2 midnights  Expected Medical Readiness: 01/16/2024          B Medical/Surgery History Past Medical History:  Diagnosis Date   Arthritis    Bronchitis    Cancer (HCC)    metastatic NSCLC (followed by [facility])   COPD (chronic obstructive pulmonary disease) (HCC)    HTN (hypertension)    Past Surgical History:  Procedure Laterality Date   LUNG BIOPSY     TOTAL HIP ARTHROPLASTY     TUMOR REMOVAL Right 12/04/2017     A IV Location/Drains/Wounds Patient Lines/Drains/Airways Status     Active Line/Drains/Airways     Name Placement date Placement time Site Days   Peripheral IV 01/15/24 20 G Anterior;Distal;Right;Upper Antecubital 01/15/24  0650  Antecubital  less than 1             Intake/Output Last 24 hours No intake or output data in the 24 hours ending 01/15/24 0758  Labs/Imaging Results for orders placed or performed during the hospital encounter of 01/14/24 (from the past 48 hours)  CBC with Differential     Status: Abnormal   Collection Time: 01/14/24  9:29 PM  Result Value Ref Range   WBC 4.9 4.0 - 10.5 K/uL   RBC 4.25 4.22 - 5.81 MIL/uL   Hemoglobin 12.6 (L) 13.0 - 17.0 g/dL   HCT 57.8 46.9 - 62.9 %   MCV 95.5 80.0 - 100.0 fL   MCH 29.6 26.0 - 34.0 pg   MCHC 31.0 30.0 - 36.0 g/dL   RDW 52.8 41.3 - 24.4 %   Platelets 209 150 - 400 K/uL   nRBC 0.0 0.0 - 0.2 %   Neutrophils Relative % 49 %   Neutro Abs 2.3 1.7 - 7.7 K/uL   Lymphocytes Relative 29 %   Lymphs Abs 1.4 0.7 - 4.0 K/uL   Monocytes Relative 17 %   Monocytes Absolute 0.9 0.1 - 1.0 K/uL   Eosinophils Relative 5 %   Eosinophils Absolute 0.2 0.0 - 0.5 K/uL   Basophils Relative 0 %   Basophils Absolute 0.0 0.0 - 0.1 K/uL   Immature Granulocytes 0 %   Abs Immature Granulocytes 0.01 0.00 - 0.07 K/uL    Comment: Performed at [facility],  [address] [place], [place] [address]  Comprehensive metabolic panel     Status: Abnormal   Collection Time: 01/14/24  9:29 PM  Result Value Ref Range   Sodium 143 135 - 145 mmol/L   Potassium 3.6 3.5 - 5.1 mmol/L   Chloride 102 98 - 111 mmol/L   CO2 35 (H) 22 - 32 mmol/L   Glucose, Bld 106 (H) 70 - 99 mg/dL    Comment: Glucose reference range applies only to samples taken after fasting for at least 8 hours.   BUN 9 8 - 23 mg/dL   Creatinine, Ser 6.04 0.61 - 1.24 mg/dL   Calcium 9.0 8.9 - 54.0 mg/dL   Total Protein 6.9 6.5 - 8.1 g/dL   Albumin 3.7 3.5 - 5.0 g/dL   AST 14 (L) 15 - 41 U/L   ALT 10 0 - 44 U/L   Alkaline Phosphatase 83 38 - 126 U/L   Total Bilirubin 0.4 0.0 - 1.2 mg/dL   GFR, Estimated >98 >11 mL/min    Comment: (NOTE) Calculated using the CKD-EPI Creatinine Equation (2021)    Anion gap 6 5 - 15    Comment: Performed at [facility], [address] [place], [place] [address]  Troponin I (High Sensitivity)     Status: None   Collection Time: 01/14/24  9:29 PM  Result Value Ref Range   Troponin I (High Sensitivity) 6 <18 ng/L    Comment: (NOTE) Elevated high sensitivity troponin I (hsTnI) values and significant  changes across serial measurements may suggest ACS but many other  chronic and acute conditions are known to elevate hsTnI results.  Refer to the "Links" section for chest pain algorithms and additional  guidance. Performed at [facility], [address] [place], [place] [address]   Brain natriuretic peptide     Status: None   Collection Time: 01/14/24  9:29 PM  Result Value Ref Range   B Natriuretic Peptide 80.0 0.0 - 100.0 pg/mL    Comment: Performed at [facility], [address] [place], [place] [address]  Lactic acid, plasma     Status: None   Collection Time: 01/14/24  9:54 PM  Result Value Ref Range   Lactic Acid, Venous 0.9 0.5 - 1.9 mmol/L    Comment: Performed at [facility], [address], [place], [place] [address]  Resp panel by RT-PCR (RSV, Flu A&B, Covid) Anterior Nasal Swab     Status: None   Collection Time: 01/14/24 10:47 PM   Specimen: Anterior Nasal Swab  Result Value Ref Range   SARS Coronavirus 2 by RT PCR NEGATIVE NEGATIVE    Comment: (NOTE) SARS-CoV-2 target nucleic acids are NOT DETECTED.  The SARS-CoV-2 RNA is generally detectable in upper respiratory specimens during the acute phase of infection. The lowest concentration of SARS-CoV-2 viral copies this assay can detect is 138 copies/mL. A negative result does not preclude SARS-Cov-2 infection and should not be used as the sole basis for treatment or other patient management decisions. A negative result may occur with  improper specimen collection/handling, submission of specimen other than nasopharyngeal swab, presence of viral mutation(s) within the areas targeted by this assay, and inadequate number of  viral copies(<138 copies/mL). A negative result must be combined with clinical observations, patient history, and epidemiological information. The expected result is Negative.  Fact Sheet for Patients:  BloggerCourse.com  Fact Sheet for Healthcare Providers:  SeriousBroker.it  This test is no t yet approved or cleared by the [place]  FDA and  has been authorized for detection and/or diagnosis of SARS-CoV-2 by FDA under an Emergency Use Authorization (EUA). This EUA will remain  in effect (meaning this test can be used) for the duration of the COVID-19 declaration under Section 564(b)(1) of the Act, 21 U.S.C.section 360bbb-3(b)(1), unless the authorization is terminated  or revoked sooner.       Influenza A by PCR NEGATIVE NEGATIVE   Influenza B by PCR NEGATIVE NEGATIVE    Comment: (NOTE) The Xpert Xpress SARS-CoV-2/FLU/RSV plus assay is intended as an aid in the diagnosis of influenza from Nasopharyngeal swab specimens and should not be used as a sole basis for treatment. Nasal washings and aspirates are unacceptable for Xpert Xpress SARS-CoV-2/FLU/RSV testing.  Fact Sheet for Patients: BloggerCourse.com  Fact Sheet for Healthcare Providers: SeriousBroker.it  This test is not yet approved or cleared by the [place] FDA and has been authorized for detection and/or diagnosis of SARS-CoV-2 by FDA under an Emergency Use Authorization (EUA). This EUA will remain in effect (meaning this test can be used) for the duration of the COVID-19 declaration under Section 564(b)(1) of the Act, 21 U.S.C. section 360bbb-3(b)(1), unless the authorization is terminated or revoked.     Resp Syncytial Virus by PCR NEGATIVE NEGATIVE    Comment: (NOTE) Fact Sheet for Patients: BloggerCourse.com  Fact Sheet for Healthcare  Providers: SeriousBroker.it  This test is not yet approved or cleared by the [place] FDA and has been authorized for detection and/or diagnosis of SARS-CoV-2 by FDA under an Emergency Use Authorization (EUA). This EUA will remain in effect (meaning this test can be used) for the duration of the COVID-19 declaration under Section 564(b)(1) of the Act, 21 U.S.C. section 360bbb-3(b)(1), unless the authorization is terminated or revoked.  Performed at [facility], [address] [place], [place] [address]   Basic metabolic panel     Status: Abnormal   Collection Time: 01/15/24  5:16 AM  Result Value Ref Range   Sodium 141 135 - 145 mmol/L   Potassium 4.4 3.5 - 5.1 mmol/L   Chloride 103 98 - 111 mmol/L   CO2 31 22 - 32 mmol/L   Glucose, Bld 139 (H) 70 - 99 mg/dL    Comment: Glucose reference range applies only to samples taken after fasting for at least 8 hours.   BUN 8 8 - 23 mg/dL   Creatinine, Ser 1.91 (L) 0.61 - 1.24 mg/dL   Calcium 8.6 (L) 8.9 - 10.3 mg/dL   GFR, Estimated >47 >82 mL/min    Comment: (NOTE) Calculated using the CKD-EPI Creatinine Equation (2021)    Anion gap 7 5 - 15    Comment: Performed at [facility], [address], [place], [place] [address]  CBC     Status: Abnormal   Collection Time: 01/15/24  5:16 AM  Result Value Ref Range   WBC 5.7 4.0 - 10.5 K/uL   RBC 3.93 (L) 4.22 - 5.81 MIL/uL   Hemoglobin 11.8 (L) 13.0 - 17.0 g/dL   HCT 30.8 (L) 65.7 - 84.6 %   MCV 95.7 80.0 - 100.0 fL   MCH 30.0 26.0 - 34.0 pg   MCHC 31.4 30.0 - 36.0 g/dL   RDW 96.2 95.2 - 84.1 %   Platelets 194 150 - 400 K/uL   nRBC 0.0 0.0 - 0.2 %    Comment: Performed at [facility], [address] [place], [place] [address]   DG Chest Port 1 View Result Date: 01/14/2024 CLINICAL  DATA:  Cough and shortness of breath. EXAM: PORTABLE CHEST 1 VIEW COMPARISON:  December 04, 2023 FINDINGS: The heart size and mediastinal contours are within normal limits.  Surgical clips are seen overlying the superior mediastinum on the left. Mild atelectasis and/or infiltrate is seen within the mid right lung and bilateral lung bases. No pleural effusion or pneumothorax is identified. The visualized skeletal structures are unremarkable. IMPRESSION: Mild mid right lung and bibasilar atelectasis and/or infiltrate. Electronically Signed   By: Aram Candela M.D.   On: 01/14/2024 22:15    Pending Labs Unresulted Labs (From admission, onward)    None       Vitals/Pain Today's Vitals   01/15/24 0600 01/15/24 0630 01/15/24 0730 01/15/24 0746  BP: 129/79 (!) 153/72 120/83   Pulse: 73 80 69   Resp: 16 (!) 22 16   Temp:      TempSrc:      SpO2: 100% 98% 100%   Weight:      Height:      PainSc:    0-No pain    Isolation Precautions No active isolations  Medications Medications  albuterol (PROVENTIL,VENTOLIN) solution continuous neb (10 mg Nebulization Not Given 01/14/24 2201)  oxyCODONE (Oxy IR/ROXICODONE) immediate release tablet 15 mg (has no administration in time range)  furosemide (LASIX) tablet 20 mg (has no administration in time range)  pantoprazole (PROTONIX) EC tablet 40 mg (has no administration in time range)  clonazePAM (KLONOPIN) tablet 1 mg (has no administration in time range)  cefTRIAXone (ROCEPHIN) 2 g in sodium chloride 0.9 % 100 mL IVPB (0 g Intravenous Stopped 01/15/24 0054)  azithromycin (ZITHROMAX) 500 mg in sodium chloride 0.9 % 250 mL IVPB (0 mg Intravenous Stopped 01/15/24 0202)  enoxaparin (LOVENOX) injection 40 mg (has no administration in time range)  0.9 %  sodium chloride infusion ( Intravenous New Bag/Given 01/15/24 0022)  acetaminophen (TYLENOL) tablet 650 mg (has no administration in time range)    Or  acetaminophen (TYLENOL) suppository 650 mg (has no administration in time range)  traZODone (DESYREL) tablet 25 mg (has no administration in time range)  magnesium hydroxide (MILK OF MAGNESIA) suspension 30 mL (has no  administration in time range)  ondansetron (ZOFRAN) tablet 4 mg (has no administration in time range)    Or  ondansetron (ZOFRAN) injection 4 mg (has no administration in time range)  ipratropium-albuterol (DUONEB) 0.5-2.5 (3) MG/3ML nebulizer solution 3 mL (has no administration in time range)  guaiFENesin (MUCINEX) 12 hr tablet 600 mg (600 mg Oral Given 01/15/24 0020)  chlorpheniramine-HYDROcodone (TUSSIONEX) 10-8 MG/5ML suspension 5 mL (has no administration in time range)  methylPREDNISolone sodium succinate (SOLU-MEDROL) 40 mg/mL injection 40 mg (40 mg Intravenous Given 01/15/24 0700)  ipratropium-albuterol (DUONEB) 0.5-2.5 (3) MG/3ML nebulizer solution 3 mL (3 mLs Nebulization Given 01/14/24 2200)  methylPREDNISolone sodium succinate (SOLU-MEDROL) 125 mg/2 mL injection 125 mg (125 mg Intravenous Given 01/14/24 2155)  albuterol (PROVENTIL) (2.5 MG/3ML) 0.083% nebulizer solution (10 mg  Given 01/14/24 2200)    Mobility walks     Focused Assessments Normally on 2 lpm now on 4 lpm   R Recommendations: See Admitting Provider Note  Report given to:   Additional Notes:  A&O x3, phone & charger in bag of belongings,

## 2024-01-15 NOTE — Assessment & Plan Note (Signed)
-  This is likely the culprit for #1. - Management as above.

## 2024-01-16 DIAGNOSIS — J441 Chronic obstructive pulmonary disease with (acute) exacerbation: Secondary | ICD-10-CM | POA: Diagnosis not present

## 2024-01-16 NOTE — Progress Notes (Signed)
 SATURATION QUALIFICATIONS: (This note is used to comply with regulatory documentation for home oxygen)  Patient Saturations on 3L at Rest = 95%  Patient Saturations on 3 Liters of oxygen while Ambulating = 92%  Pt on 3L [place] at home. Pt able to ambulate ~100 ft  on 3L [place] with taking multiple breaks due to SOB and dizziness.

## 2024-01-16 NOTE — Plan of Care (Signed)
  Problem: Education: Goal: Knowledge of General Education information will improve Description: Including pain rating scale, medication(s)/side effects and non-pharmacologic comfort measures Outcome: Progressing   Problem: Health Behavior/Discharge Planning: Goal: Ability to manage health-related needs will improve Outcome: Progressing   Problem: Nutrition: Goal: Adequate nutrition will be maintained Outcome: Progressing   Problem: Safety: Goal: Ability to remain free from injury will improve Outcome: Progressing   Problem: Activity: Goal: Ability to tolerate increased activity will improve Outcome: Progressing   Problem: Respiratory: Goal: Ability to maintain a clear airway will improve Outcome: Progressing   Problem: Coping: Goal: Level of anxiety will decrease Outcome: Not Progressing

## 2024-01-16 NOTE — Care Management Important Message (Signed)
 Important Message  Patient Details  Name: Miguel Marsh MRN: [id number] Date of Birth: 1961-03-19   Important Message Given:  N/A - LOS <3 / Initial given by admissions     [name] 01/16/2024, 1:06 PM

## 2024-01-16 NOTE — Progress Notes (Signed)
 PROGRESS NOTE    Miguel Marsh  NFA:[id number] DOB: 1961/10/17 DOA: 01/14/2024 PCP: [name], NP   Brief Narrative:  Miguel Marsh is a 63 y.o.African-American male with medical history significant for osteoarthritis, COPD on 2-3L [place] continuously, hypertension, and metastatic non-squamous cell lung cancer, who presented to the emergency room with acute onset of worsening cough with associated expectoration of yellowish and greenish sputum as well as dyspnea and wheezing. Patient reports recent use of cocaine as well complicating his respiratory status.   Assessment & Plan:   Principal Problem:   COPD exacerbation (HCC) Active Problems:   CAP (community acquired pneumonia)   Anxiety   GERD without esophagitis   Chronic hypoxic respiratory failure without acute hypoxia  COPD exacerbation  Respiratory distress without hypoxia from baseline  Community-acquired pneumonia, POA -Continue to provide supportive care with nebs, supplemental oxygen -Antibiotics ongoing to cover possible underlying infection thought to be complication for his exacerbation -Patient reports baseline of 2 to 3 L nasal cannula, continuous at home -Patient profoundly dyspneic today with even minimal exertion (sit to stand, transferring to chair)  Cocaine use/abuse, ongoing  GERD without esophagitis - Continue PPI therapy.   Anxiety - We will continue Klonopin at home dose  DVT prophylaxis: enoxaparin (LOVENOX) injection 40 mg Start: 01/15/24 1000   Code Status:   Code Status: Full Code  Family Communication: None present  Status is: Inpatient  Dispo: The patient is from: Home              Anticipated d/c is to: Home              Anticipated d/c date is: 24 to 48 hours              Patient currently not medically stable for discharge  Consultants:  None  Procedures:  None  Antimicrobials:  Ceftriaxone, azithromycin x 5 days  Subjective: No acute issues or events overnight,  respiratory status somewhat improving but still profoundly diminished from baseline.  Unable to ambulate any distance in the room, profound exertional dyspnea just transferring from bedside chair to bed  Objective: Vitals:   01/15/24 1730 01/15/24 1932 01/15/24 2008 01/16/24 0342  BP: 128/68 136/87  132/76  Pulse: 77 72  75  Resp:      Temp: 97.8 F (36.6 C) 98 F (36.7 C)  97.8 F (36.6 C)  TempSrc: Oral Oral  Oral  SpO2: 100% 100% 95% 100%  Weight:      Height:        Intake/Output Summary (Last 24 hours) at 01/16/2024 0743 Last data filed at 01/16/2024 0342 Gross per 24 hour  Intake 420 ml  Output 2300 ml  Net -1880 ml   Filed Weights   01/14/24 2127  Weight: 76.2 kg    Examination:  General exam: Appears calm and comfortable  Respiratory system: Clear to auscultation. Respiratory effort normal. Cardiovascular system: S1 & S2 heard, RRR. No JVD, murmurs, rubs, gallops or clicks. No pedal edema. Gastrointestinal system: Abdomen is nondistended, soft and nontender. No organomegaly or masses felt. Normal bowel sounds heard. Central nervous system: Alert and oriented. No focal neurological deficits. Extremities: Symmetric 5 x 5 power. Skin: No rashes, lesions or ulcers Psychiatry: Judgement and insight appear normal. Mood & affect appropriate.     Data Reviewed: I have personally reviewed following labs and imaging studies  CBC: Recent Labs  Lab 01/14/24 2129 01/15/24 0516  WBC 4.9 5.7  NEUTROABS 2.3  --  HGB 12.6* 11.8*  HCT 40.6 37.6*  MCV 95.5 95.7  PLT 209 194   Basic Metabolic Panel: Recent Labs  Lab 01/14/24 2129 01/15/24 0516  NA 143 141  K 3.6 4.4  CL 102 103  CO2 35* 31  GLUCOSE 106* 139*  BUN 9 8  CREATININE 0.62 0.56*  CALCIUM 9.0 8.6*   GFR: Estimated Creatinine Clearance: 89.5 mL/min (A) (by C-G formula based on SCr of 0.56 mg/dL (L)). Liver Function Tests: Recent Labs  Lab 01/14/24 2129  AST 14*  ALT 10  ALKPHOS 83  BILITOT  0.4  PROT 6.9  ALBUMIN 3.7   No results for input(s): "LIPASE", "AMYLASE" in the last 168 hours. No results for input(s): "AMMONIA" in the last 168 hours. Coagulation Profile: No results for input(s): "INR", "PROTIME" in the last 168 hours. Cardiac Enzymes: No results for input(s): "CKTOTAL", "CKMB", "CKMBINDEX", "TROPONINI" in the last 168 hours. BNP (last 3 results) No results for input(s): "PROBNP" in the last 8760 hours. HbA1C: No results for input(s): "HGBA1C" in the last 72 hours. CBG: No results for input(s): "GLUCAP" in the last 168 hours. Lipid Profile: No results for input(s): "CHOL", "HDL", "LDLCALC", "TRIG", "CHOLHDL", "LDLDIRECT" in the last 72 hours. Thyroid Function Tests: No results for input(s): "TSH", "T4TOTAL", "FREET4", "T3FREE", "THYROIDAB" in the last 72 hours. Anemia Panel: No results for input(s): "VITAMINB12", "FOLATE", "FERRITIN", "TIBC", "IRON", "RETICCTPCT" in the last 72 hours. Sepsis Labs: Recent Labs  Lab 01/14/24 2154  LATICACIDVEN 0.9    Recent Results (from the past 240 hours)  Resp panel by RT-PCR (RSV, Flu A&B, Covid) Anterior Nasal Swab     Status: None   Collection Time: 01/14/24 10:47 PM   Specimen: Anterior Nasal Swab  Result Value Ref Range Status   SARS Coronavirus 2 by RT PCR NEGATIVE NEGATIVE Final    Comment: (NOTE) SARS-CoV-2 target nucleic acids are NOT DETECTED.  The SARS-CoV-2 RNA is generally detectable in upper respiratory specimens during the acute phase of infection. The lowest concentration of SARS-CoV-2 viral copies this assay can detect is 138 copies/mL. A negative result does not preclude SARS-Cov-2 infection and should not be used as the sole basis for treatment or other patient management decisions. A negative result may occur with  improper specimen collection/handling, submission of specimen other than nasopharyngeal swab, presence of viral mutation(s) within the areas targeted by this assay, and inadequate  number of viral copies(<138 copies/mL). A negative result must be combined with clinical observations, patient history, and epidemiological information. The expected result is Negative.  Fact Sheet for Patients:  BloggerCourse.com  Fact Sheet for Healthcare Providers:  SeriousBroker.it  This test is no t yet approved or cleared by the [place] FDA and  has been authorized for detection and/or diagnosis of SARS-CoV-2 by FDA under an Emergency Use Authorization (EUA). This EUA will remain  in effect (meaning this test can be used) for the duration of the COVID-19 declaration under Section 564(b)(1) of the Act, 21 U.S.C.section 360bbb-3(b)(1), unless the authorization is terminated  or revoked sooner.       Influenza A by PCR NEGATIVE NEGATIVE Final   Influenza B by PCR NEGATIVE NEGATIVE Final    Comment: (NOTE) The Xpert Xpress SARS-CoV-2/FLU/RSV plus assay is intended as an aid in the diagnosis of influenza from Nasopharyngeal swab specimens and should not be used as a sole basis for treatment. Nasal washings and aspirates are unacceptable for Xpert Xpress SARS-CoV-2/FLU/RSV testing.  Fact Sheet for Patients: BloggerCourse.com  Fact  Sheet for Healthcare Providers: SeriousBroker.it  This test is not yet approved or cleared by the [place] and has been authorized for detection and/or diagnosis of SARS-CoV-2 by FDA under an Emergency Use Authorization (EUA). This EUA will remain in effect (meaning this test can be used) for the duration of the COVID-19 declaration under Section 564(b)(1) of the Act, 21 U.S.C. section 360bbb-3(b)(1), unless the authorization is terminated or revoked.     Resp Syncytial Virus by PCR NEGATIVE NEGATIVE Final    Comment: (NOTE) Fact Sheet for Patients: BloggerCourse.com  Fact Sheet for Healthcare  Providers: SeriousBroker.it  This test is not yet approved or cleared by the [place] FDA and has been authorized for detection and/or diagnosis of SARS-CoV-2 by FDA under an Emergency Use Authorization (EUA). This EUA will remain in effect (meaning this test can be used) for the duration of the COVID-19 declaration under Section 564(b)(1) of the Act, 21 U.S.C. section 360bbb-3(b)(1), unless the authorization is terminated or revoked.  Performed at [facility], [address] [place], [place] [address]      Radiology Studies: [facility] Chest [facility] 1 View Result Date: 01/14/2024 CLINICAL DATA:  Cough and shortness of breath. EXAM: PORTABLE CHEST 1 VIEW COMPARISON:  December 04, 2023 FINDINGS: The heart size and mediastinal contours are within normal limits. Surgical clips are seen overlying the superior mediastinum on the left. Mild atelectasis and/or infiltrate is seen within the mid right lung and bilateral lung bases. No pleural effusion or pneumothorax is identified. The visualized skeletal structures are unremarkable. IMPRESSION: Mild mid right lung and bibasilar atelectasis and/or infiltrate. Electronically Signed   By: Aram Candela M.D.   On: 01/14/2024 22:15   Scheduled Meds:  enoxaparin (LOVENOX) injection  40 mg Subcutaneous Q24H   guaiFENesin  600 mg Oral BID   ipratropium-albuterol  3 mL Nebulization QID   methylPREDNISolone (SOLU-MEDROL) injection  40 mg Intravenous Q12H   pantoprazole  40 mg Oral Daily   Continuous Infusions:  azithromycin 500 mg (01/15/24 2208)   cefTRIAXone (ROCEPHIN)  IV 2 g (01/15/24 2207)     LOS: 2 days   Time spent:  Azucena Fallen, DO [facility]  If 7PM-7AM, please contact night-coverage [email or web address]  01/16/2024, 7:43 AM

## 2024-01-17 DIAGNOSIS — J441 Chronic obstructive pulmonary disease with (acute) exacerbation: Secondary | ICD-10-CM | POA: Diagnosis not present

## 2024-01-17 MED ORDER — PREDNISONE 10 MG PO TABS: ORAL_TABLET | ORAL | 0 refills | Status: DC

## 2024-01-17 MED ORDER — AZITHROMYCIN 500 MG PO TABS: 500.0000 mg | ORAL_TABLET | Freq: Every day | ORAL | 0 refills | Status: DC

## 2024-01-17 MED ORDER — ALBUTEROL SULFATE (2.5 MG/3ML) 0.083% IN NEBU: 2.5000 mg | INHALATION_SOLUTION | Freq: Four times a day (QID) | RESPIRATORY_TRACT | 0 refills | Status: DC | PRN

## 2024-01-17 NOTE — Discharge Summary (Signed)
 Physician Discharge Summary  Miguel Marsh NFA:[id number] DOB: [birth year] DOA: 01/14/2024  PCP: [name], NP  Admit date: 01/14/2024 Discharge date: 01/17/2024  Admitted From: Home Disposition: Home  Recommendations for Outpatient Follow-up:  Follow up with PCP in 1-2 weeks Pulmonology as scheduled: Would benefit from outpatient sleep apnea testing  Home Health: None Equipment/Devices: None  Discharge Condition: Stable CODE STATUS: Full Diet recommendation: Low-salt low-carb low-fat diet  Brief/Interim Summary: Miguel Marsh is a 63 y.o.African-American male with medical history significant for osteoarthritis, COPD on 2-3L [place] continuously, hypertension, and metastatic non-squamous cell lung cancer, who presented to the emergency room with acute onset of worsening cough with associated expectoration of yellowish and greenish sputum as well as dyspnea and wheezing. Patient reports recent use of cocaine as well complicating his respiratory status.  Patient admitted as above with acute respiratory failure and chronic hypoxia, fortunately he was never truly hypoxic from baseline noted profound respiratory symptoms at intake concerning for COPD exacerbation.  Concurrent community-acquired pneumonia is also possible, as such she has been treated for both.  At this time he is now back to baseline ambulating without any further hypoxia or overt symptoms.  Recommend close follow-up with PCP in the next 1 to 2 weeks and follow-up with pulmonology as scheduled.  Discussed need for cocaine cessation as well.  Patient otherwise stable and agreeable for discharge home.  Discharge Diagnoses:  Principal Problem:   COPD exacerbation (HCC) Active Problems:   CAP (community acquired pneumonia)   Anxiety   GERD without esophagitis  Chronic hypoxic respiratory failure without acute hypoxia  COPD exacerbation  Respiratory distress without hypoxia from baseline  Community-acquired pneumonia,  POA -Continue antibiotics treatments as needed at home -Continued on home baseline of 2 to 3 L nasal cannula; without hypoxia or symptoms   Cocaine use/abuse, ongoing GERD without esophagitis - Continue PPI therapy. Anxiety - We will continue Klonopin at home dose  Discharge Instructions  Discharge Instructions     Call MD for:  difficulty breathing, headache or visual disturbances   Complete by: As directed    Call MD for:  extreme fatigue   Complete by: As directed    Call MD for:  hives   Complete by: As directed    Call MD for:  persistant dizziness or light-headedness   Complete by: As directed    Call MD for:  persistant nausea and vomiting   Complete by: As directed    Call MD for:  severe uncontrolled pain   Complete by: As directed    Call MD for:  temperature >100.4   Complete by: As directed    Diet - low sodium heart healthy   Complete by: As directed    Increase activity slowly   Complete by: As directed       Allergies as of 01/17/2024       Reactions   Ibuprofen Shortness Of Breath        Medication List     TAKE these medications    albuterol 108 (90 Base) MCG/ACT inhaler Commonly known as: VENTOLIN HFA Inhale 2 puffs into the lungs every 6 (six) hours as needed.   albuterol (2.5 MG/3ML) 0.083% nebulizer solution Commonly known as: PROVENTIL Inhale 3 mLs (2.5 mg total) into the lungs every 6 (six) hours as needed for wheezing or shortness of breath.   azithromycin 500 MG tablet Commonly known as: ZITHROMAX Take 1 tablet (500 mg total) by mouth daily.   Breztri Aerosphere  160-9-4.8 MCG/ACT Aero Generic drug: budeson-glycopyrrolate-formoterol Inhale 2 puffs into the lungs 2 (two) times daily.   clonazePAM 1 MG tablet Commonly known as: KLONOPIN Take 1 tablet (1 mg total) by mouth 2 (two) times daily as needed for anxiety.   furosemide 40 MG tablet Commonly known as: LASIX Take 0.5 tablets (20 mg total) by mouth See admin instructions.  Take once a day  as needed for fluid/swelling   omeprazole 40 MG capsule Commonly known as: PRILOSEC Take 1 capsule (40 mg total) by mouth daily.   oxyCODONE 15 MG immediate release tablet Commonly known as: ROXICODONE Take 15 mg by mouth every 4 (four) hours as needed for pain.   OXYGEN Inhale 3 L into the lungs continuous.   predniSONE 10 MG tablet Commonly known as: DELTASONE Take 4 tablets (40 mg total) by mouth daily for 3 days, THEN 3 tablets (30 mg total) daily for 3 days, THEN 2 tablets (20 mg total) daily for 3 days, THEN 1 tablet (10 mg total) daily for 3 days. Start taking on: January 17, 2024        Allergies  Allergen Reactions   Ibuprofen Shortness Of Breath    Consultations: None  Procedures/Studies: DG Chest Port 1 View Result Date: 01/14/2024 CLINICAL DATA:  Cough and shortness of breath. EXAM: PORTABLE CHEST 1 VIEW COMPARISON:  December 04, 2023 FINDINGS: The heart size and mediastinal contours are within normal limits. Surgical clips are seen overlying the superior mediastinum on the left. Mild atelectasis and/or infiltrate is seen within the mid right lung and bilateral lung bases. No pleural effusion or pneumothorax is identified. The visualized skeletal structures are unremarkable. IMPRESSION: Mild mid right lung and bibasilar atelectasis and/or infiltrate. Electronically Signed   By: Aram Candela M.D.   On: 01/14/2024 22:15     Subjective: No acute issues or events overnight denies nausea vomiting diarrhea constipation headache fevers or chest pain   Discharge Exam: Vitals:   01/17/24 1042 01/17/24 1043  BP:    Pulse:    Resp:    Temp:    SpO2: 99% 95%   Vitals:   01/16/24 1953 01/17/24 0323 01/17/24 1042 01/17/24 1043  BP: (!) 142/70 128/71    Pulse: 82 69    Resp: 16     Temp: 98.2 F (36.8 C) 97.8 F (36.6 C)    TempSrc: Oral Oral    SpO2: 100% 100% 99% 95%  Weight:      Height:        General: Pt is alert, awake, not in acute  distress Cardiovascular: RRR, S1/S2 +, no rubs, no gallops Respiratory: CTA bilaterally, no wheezing, no rhonchi Abdominal: Soft, NT, ND, bowel sounds + Extremities: no edema, no cyanosis    The results of significant diagnostics from this hospitalization (including imaging, microbiology, ancillary and laboratory) are listed below for reference.     Microbiology: Recent Results (from the past 240 hours)  Resp panel by RT-PCR (RSV, Flu A&B, Covid) Anterior Nasal Swab     Status: None   Collection Time: 01/14/24 10:47 PM   Specimen: Anterior Nasal Swab  Result Value Ref Range Status   SARS Coronavirus 2 by RT PCR NEGATIVE NEGATIVE Final    Comment: (NOTE) SARS-CoV-2 target nucleic acids are NOT DETECTED.  The SARS-CoV-2 RNA is generally detectable in upper respiratory specimens during the acute phase of infection. The lowest concentration of SARS-CoV-2 viral copies this assay can detect is 138 copies/mL. A negative result does not preclude  SARS-Cov-2 infection and should not be used as the sole basis for treatment or other patient management decisions. A negative result may occur with  improper specimen collection/handling, submission of specimen other than nasopharyngeal swab, presence of viral mutation(s) within the areas targeted by this assay, and inadequate number of viral copies(<138 copies/mL). A negative result must be combined with clinical observations, patient history, and epidemiological information. The expected result is Negative.  Fact Sheet for Patients:  BloggerCourse.com  Fact Sheet for Healthcare Providers:  SeriousBroker.it  This test is no t yet approved or cleared by the [place] FDA and  has been authorized for detection and/or diagnosis of SARS-CoV-2 by FDA under an Emergency Use Authorization (EUA). This EUA will remain  in effect (meaning this test can be used) for the duration of the COVID-19  declaration under Section 564(b)(1) of the Act, 21 U.S.C.section 360bbb-3(b)(1), unless the authorization is terminated  or revoked sooner.       Influenza A by PCR NEGATIVE NEGATIVE Final   Influenza B by PCR NEGATIVE NEGATIVE Final    Comment: (NOTE) The Xpert Xpress SARS-CoV-2/FLU/RSV plus assay is intended as an aid in the diagnosis of influenza from Nasopharyngeal swab specimens and should not be used as a sole basis for treatment. Nasal washings and aspirates are unacceptable for Xpert Xpress SARS-CoV-2/FLU/RSV testing.  Fact Sheet for Patients: BloggerCourse.com  Fact Sheet for Healthcare Providers: SeriousBroker.it  This test is not yet approved or cleared by the [place] FDA and has been authorized for detection and/or diagnosis of SARS-CoV-2 by FDA under an Emergency Use Authorization (EUA). This EUA will remain in effect (meaning this test can be used) for the duration of the COVID-19 declaration under Section 564(b)(1) of the Act, 21 U.S.C. section 360bbb-3(b)(1), unless the authorization is terminated or revoked.     Resp Syncytial Virus by PCR NEGATIVE NEGATIVE Final    Comment: (NOTE) Fact Sheet for Patients: BloggerCourse.com  Fact Sheet for Healthcare Providers: SeriousBroker.it  This test is not yet approved or cleared by the [place] FDA and has been authorized for detection and/or diagnosis of SARS-CoV-2 by FDA under an Emergency Use Authorization (EUA). This EUA will remain in effect (meaning this test can be used) for the duration of the COVID-19 declaration under Section 564(b)(1) of the Act, 21 U.S.C. section 360bbb-3(b)(1), unless the authorization is terminated or revoked.  Performed at [facility], [address], [place], [place] [address]      Labs: BNP (last 3 results) Recent Labs    09/13/23 0914 12/04/23 1448 01/14/24 2129   BNP 27.0 38.0 80.0   Basic Metabolic Panel: Recent Labs  Lab 01/14/24 2129 01/15/24 0516  NA 143 141  K 3.6 4.4  CL 102 103  CO2 35* 31  GLUCOSE 106* 139*  BUN 9 8  CREATININE 0.62 0.56*  CALCIUM 9.0 8.6*   Liver Function Tests: Recent Labs  Lab 01/14/24 2129  AST 14*  ALT 10  ALKPHOS 83  BILITOT 0.4  PROT 6.9  ALBUMIN 3.7   No results for input(s): "LIPASE", "AMYLASE" in the last 168 hours. No results for input(s): "AMMONIA" in the last 168 hours. CBC: Recent Labs  Lab 01/14/24 2129 01/15/24 0516  WBC 4.9 5.7  NEUTROABS 2.3  --   HGB 12.6* 11.8*  HCT 40.6 37.6*  MCV 95.5 95.7  PLT 209 194   Cardiac Enzymes: No results for input(s): "CKTOTAL", "CKMB", "CKMBINDEX", "TROPONINI" in the last 168 hours. BNP: Invalid input(s): "POCBNP" CBG:  No results for input(s): "GLUCAP" in the last 168 hours. D-Dimer No results for input(s): "DDIMER" in the last 72 hours. Hgb A1c No results for input(s): "HGBA1C" in the last 72 hours. Lipid Profile No results for input(s): "CHOL", "HDL", "LDLCALC", "TRIG", "CHOLHDL", "LDLDIRECT" in the last 72 hours. Thyroid function studies No results for input(s): "TSH", "T4TOTAL", "T3FREE", "THYROIDAB" in the last 72 hours.  Invalid input(s): "FREET3" Anemia work up No results for input(s): "VITAMINB12", "FOLATE", "FERRITIN", "TIBC", "IRON", "RETICCTPCT" in the last 72 hours. Urinalysis    Component Value Date/Time   COLORURINE YELLOW 03/06/2023 1130   APPEARANCEUR HAZY (A) 03/06/2023 1130   LABSPEC 1.023 03/06/2023 1130   PHURINE 5.0 03/06/2023 1130   GLUCOSEU 50 (A) 03/06/2023 1130   HGBUR NEGATIVE 03/06/2023 1130   BILIRUBINUR NEGATIVE 03/06/2023 1130   KETONESUR NEGATIVE 03/06/2023 1130   PROTEINUR 100 (A) 03/06/2023 1130   UROBILINOGEN 0.2 10/21/2013 0200   NITRITE NEGATIVE 03/06/2023 1130   LEUKOCYTESUR NEGATIVE 03/06/2023 1130   Sepsis Labs Recent Labs  Lab 01/14/24 2129 01/15/24 0516  WBC 4.9 5.7    Microbiology Recent Results (from the past 240 hours)  Resp panel by RT-PCR (RSV, Flu A&B, Covid) Anterior Nasal Swab     Status: None   Collection Time: 01/14/24 10:47 PM   Specimen: Anterior Nasal Swab  Result Value Ref Range Status   SARS Coronavirus 2 by RT PCR NEGATIVE NEGATIVE Final    Comment: (NOTE) SARS-CoV-2 target nucleic acids are NOT DETECTED.  The SARS-CoV-2 RNA is generally detectable in upper respiratory specimens during the acute phase of infection. The lowest concentration of SARS-CoV-2 viral copies this assay can detect is 138 copies/mL. A negative result does not preclude SARS-Cov-2 infection and should not be used as the sole basis for treatment or other patient management decisions. A negative result may occur with  improper specimen collection/handling, submission of specimen other than nasopharyngeal swab, presence of viral mutation(s) within the areas targeted by this assay, and inadequate number of viral copies(<138 copies/mL). A negative result must be combined with clinical observations, patient history, and epidemiological information. The expected result is Negative.  Fact Sheet for Patients:  BloggerCourse.com  Fact Sheet for Healthcare Providers:  SeriousBroker.it  This test is no t yet approved or cleared by the [place] FDA and  has been authorized for detection and/or diagnosis of SARS-CoV-2 by FDA under an Emergency Use Authorization (EUA). This EUA will remain  in effect (meaning this test can be used) for the duration of the COVID-19 declaration under Section 564(b)(1) of the Act, 21 U.S.C.section 360bbb-3(b)(1), unless the authorization is terminated  or revoked sooner.       Influenza A by PCR NEGATIVE NEGATIVE Final   Influenza B by PCR NEGATIVE NEGATIVE Final    Comment: (NOTE) The Xpert Xpress SARS-CoV-2/FLU/RSV plus assay is intended as an aid in the diagnosis of influenza  from Nasopharyngeal swab specimens and should not be used as a sole basis for treatment. Nasal washings and aspirates are unacceptable for Xpert Xpress SARS-CoV-2/FLU/RSV testing.  Fact Sheet for Patients: BloggerCourse.com  Fact Sheet for Healthcare Providers: SeriousBroker.it  This test is not yet approved or cleared by the [place] FDA and has been authorized for detection and/or diagnosis of SARS-CoV-2 by FDA under an Emergency Use Authorization (EUA). This EUA will remain in effect (meaning this test can be used) for the duration of the COVID-19 declaration under Section 564(b)(1) of the Act, 21 U.S.C. section 360bbb-3(b)(1), unless the authorization  is terminated or revoked.     Resp Syncytial Virus by PCR NEGATIVE NEGATIVE Final    Comment: (NOTE) Fact Sheet for Patients: BloggerCourse.com  Fact Sheet for Healthcare Providers: SeriousBroker.it  This test is not yet approved or cleared by the [place] FDA and has been authorized for detection and/or diagnosis of SARS-CoV-2 by FDA under an Emergency Use Authorization (EUA). This EUA will remain in effect (meaning this test can be used) for the duration of the COVID-19 declaration under Section 564(b)(1) of the Act, 21 U.S.C. section 360bbb-3(b)(1), unless the authorization is terminated or revoked.  Performed at [facility], [address] [place], [place] [address]      Time coordinating discharge: Over 30 minutes  SIGNED:   Azucena Fallen, DO [facility] 01/17/2024, 12:44 PM Pager   If 7PM-7AM, please contact night-coverage [email or web address]

## 2024-01-17 NOTE — Care Management Important Message (Signed)
 Important Message  Patient Details  Name: JVON MERONEY MRN: [id number] Date of Birth: 04-27-1961   Important Message Given:  N/A - LOS <3 / Initial given by admissions     [name] 01/17/2024, 2:00 PM

## 2024-01-17 NOTE — Progress Notes (Signed)
 Patient ambulated in hallway approximately 146ft with staff on baseline 3L nasal cannula. Maintained O2 saturation at 95% throughout ambulation. Dr. Natale Milch made aware.

## 2024-01-17 NOTE — Plan of Care (Signed)
  Problem: Education: Goal: Knowledge of General Education information will improve Description Including pain rating scale, medication(s)/side effects and non-pharmacologic comfort measures Outcome: Progressing   Problem: Clinical Measurements: Goal: Ability to maintain a body temperature in the normal range will improve Outcome: Progressing

## 2024-01-27 ENCOUNTER — Inpatient Hospital Stay (HOSPITAL_COMMUNITY)
Admission: EM | Admit: 2024-01-27 | Discharge: 2024-01-31 | DRG: 189 | Disposition: A | Discharge status: Home / Self Care | Attending: Family Medicine | Admitting: Family Medicine

## 2024-01-27 ENCOUNTER — Other Ambulatory Visit: Payer: Self-pay

## 2024-01-27 ENCOUNTER — Emergency Department (HOSPITAL_COMMUNITY)

## 2024-01-27 ENCOUNTER — Encounter (HOSPITAL_COMMUNITY): Payer: Self-pay | Admitting: Emergency Medicine

## 2024-01-27 DIAGNOSIS — I10 Essential (primary) hypertension: Secondary | ICD-10-CM | POA: Diagnosis present

## 2024-01-27 DIAGNOSIS — J441 Chronic obstructive pulmonary disease with (acute) exacerbation: Principal | ICD-10-CM | POA: Diagnosis present

## 2024-01-27 DIAGNOSIS — Z85118 Personal history of other malignant neoplasm of bronchus and lung: Secondary | ICD-10-CM

## 2024-01-27 DIAGNOSIS — F419 Anxiety disorder, unspecified: Secondary | ICD-10-CM | POA: Diagnosis present

## 2024-01-27 DIAGNOSIS — K219 Gastro-esophageal reflux disease without esophagitis: Secondary | ICD-10-CM | POA: Diagnosis not present

## 2024-01-27 DIAGNOSIS — R000 Tachycardia, unspecified: Secondary | ICD-10-CM | POA: Diagnosis present

## 2024-01-27 DIAGNOSIS — Z886 Allergy status to analgesic agent status: Secondary | ICD-10-CM

## 2024-01-27 DIAGNOSIS — G8929 Other chronic pain: Secondary | ICD-10-CM | POA: Diagnosis present

## 2024-01-27 DIAGNOSIS — Z716 Tobacco abuse counseling: Secondary | ICD-10-CM

## 2024-01-27 DIAGNOSIS — J9621 Acute and chronic respiratory failure with hypoxia: Principal | ICD-10-CM | POA: Diagnosis present

## 2024-01-27 DIAGNOSIS — F1490 Cocaine use, unspecified, uncomplicated: Secondary | ICD-10-CM | POA: Diagnosis present

## 2024-01-27 DIAGNOSIS — Z1152 Encounter for screening for COVID-19: Secondary | ICD-10-CM

## 2024-01-27 DIAGNOSIS — J479 Bronchiectasis, uncomplicated: Secondary | ICD-10-CM | POA: Diagnosis present

## 2024-01-27 DIAGNOSIS — F17210 Nicotine dependence, cigarettes, uncomplicated: Secondary | ICD-10-CM | POA: Diagnosis present

## 2024-01-27 DIAGNOSIS — Z96641 Presence of right artificial hip joint: Secondary | ICD-10-CM | POA: Diagnosis present

## 2024-01-27 DIAGNOSIS — Z9981 Dependence on supplemental oxygen: Secondary | ICD-10-CM

## 2024-01-27 LAB — COMPREHENSIVE METABOLIC PANEL WITH GFR
ALT: 12 U/L (ref 0–44)
AST: 15 U/L (ref 15–41)
Albumin: 3.3 g/dL — ABNORMAL LOW (ref 3.5–5.0)
Alkaline Phosphatase: 56 U/L (ref 38–126)
Anion gap: 9 (ref 5–15)
BUN: 12 mg/dL (ref 8–23)
CO2: 38 mmol/L — ABNORMAL HIGH (ref 22–32)
Calcium: 8.7 mg/dL — ABNORMAL LOW (ref 8.9–10.3)
Chloride: 91 mmol/L — ABNORMAL LOW (ref 98–111)
Creatinine, Ser: 0.61 mg/dL (ref 0.61–1.24)
GFR, Estimated: >60 mL/min (ref >60)
Glucose, Bld: 200 mg/dL — ABNORMAL HIGH (ref 70–99)
Potassium: 3.8 mmol/L (ref 3.5–5.1)
Sodium: 138 mmol/L (ref 135–145)
Total Bilirubin: 0.8 mg/dL (ref 0.0–1.2)
Total Protein: 6.4 g/dL — ABNORMAL LOW (ref 6.5–8.1)

## 2024-01-27 LAB — CBC WITH DIFFERENTIAL/PLATELET
Abs Immature Granulocytes: 0.02 10*3/uL (ref 0.00–0.07)
Basophils Absolute: 0 10*3/uL (ref 0.0–0.1)
Basophils Relative: 0 %
Eosinophils Absolute: 0.1 10*3/uL (ref 0.0–0.5)
Eosinophils Relative: 2 %
HCT: 39.7 % (ref 39.0–52.0)
Hemoglobin: 12.1 g/dL — ABNORMAL LOW (ref 13.0–17.0)
Immature Granulocytes: 0 %
Lymphocytes Relative: 12 %
Lymphs Abs: 0.9 10*3/uL (ref 0.7–4.0)
MCH: 29.7 pg (ref 26.0–34.0)
MCHC: 30.5 g/dL (ref 30.0–36.0)
MCV: 97.3 fL (ref 80.0–100.0)
Monocytes Absolute: 0.8 10*3/uL (ref 0.1–1.0)
Monocytes Relative: 12 %
Neutro Abs: 5.1 10*3/uL (ref 1.7–7.7)
Neutrophils Relative %: 74 %
Platelets: 177 10*3/uL (ref 150–400)
RBC: 4.08 MIL/uL — ABNORMAL LOW (ref 4.22–5.81)
RDW: 12.1 % (ref 11.5–15.5)
WBC: 6.9 10*3/uL (ref 4.0–10.5)
nRBC: 0 % (ref 0.0–0.2)

## 2024-01-27 LAB — RESP PANEL BY RT-PCR (RSV, FLU A&B, COVID)  RVPGX2
Influenza A by PCR: NEGATIVE
Influenza B by PCR: NEGATIVE
Resp Syncytial Virus by PCR: NEGATIVE
SARS Coronavirus 2 by RT PCR: NEGATIVE

## 2024-01-27 LAB — TROPONIN I (HIGH SENSITIVITY)
Troponin I (High Sensitivity): 6 ng/L (ref <18)
Troponin I (High Sensitivity): 6 ng/L (ref <18)

## 2024-01-27 LAB — BRAIN NATRIURETIC PEPTIDE: B Natriuretic Peptide: 20 pg/mL (ref 0.0–100.0)

## 2024-01-27 MED ORDER — METHYLPREDNISOLONE SODIUM SUCC 125 MG IJ SOLR
125.0000 mg | Freq: Once | INTRAMUSCULAR | Status: AC
  Administered 2024-01-27: 125 mg via INTRAVENOUS
  Filled 2024-01-27: qty 2

## 2024-01-27 MED ORDER — IPRATROPIUM-ALBUTEROL 0.5-2.5 (3) MG/3ML IN SOLN
3.0000 mL | Freq: Once | RESPIRATORY_TRACT | Status: AC
  Administered 2024-01-27: 3 mL via RESPIRATORY_TRACT
  Filled 2024-01-27: qty 3

## 2024-01-27 MED ORDER — MAGNESIUM SULFATE 2 GM/50ML IV SOLN
2.0000 g | Freq: Once | INTRAVENOUS | Status: AC
  Administered 2024-01-27: 2 g via INTRAVENOUS
  Filled 2024-01-27: qty 50

## 2024-01-27 MED ORDER — ALBUTEROL SULFATE (2.5 MG/3ML) 0.083% IN NEBU
2.5000 mg | INHALATION_SOLUTION | Freq: Once | RESPIRATORY_TRACT | Status: AC
  Administered 2024-01-27: 2.5 mg via RESPIRATORY_TRACT
  Filled 2024-01-27: qty 3

## 2024-01-27 NOTE — ED Triage Notes (Signed)
Pt c/o chest pain and sob all day.  ?

## 2024-01-27 NOTE — ED Notes (Signed)
 RT called for neb tx.

## 2024-01-27 NOTE — ED Notes (Signed)
 Pt given cup of ice per request and Dr Estell Harpin ok

## 2024-01-27 NOTE — H&P (Signed)
 [place]   PATIENT NAME: Miguel Marsh    MR#:  [id number]  DATE OF BIRTH:  April 09, 1961  DATE OF ADMISSION:  01/27/2024  PRIMARY CARE PHYSICIAN: [name], NP   Patient is coming from: Home  REQUESTING/REFERRING PHYSICIAN: Bethann Berkshire, MD  CHIEF COMPLAINT:   Chief Complaint  Patient presents with   Chest Pain  Cough and shortness of breath.  HISTORY OF PRESENT ILLNESS:  Miguel Marsh is a 63 y.o. African-American male with medical history significant for COPD, chronic respiratory failure on home O2 at 2 L/min, essential hypertension and metastatic non-small cell lung cancer, who presented to the emergency room with acute onset of worsening cough productive of whitish sputum as well as dyspnea and wheezing since yesterday.  He denies any fever or chills.  No nausea or vomiting or diarrhea or abdominal pain.  He admits to chest pain only with cough without palpitations.  No dysuria, oliguria or hematuria or flank pain.  ED Course: When the patient came to the ER, vital signs revealed heart rate of 107 with respiratory rate of 22 and blood pressure 144/75.  Pulse oximetry was 80% on room air and 94% on 2.5 L of O2 by nasal cannula. EKG as reviewed by me : EKG showed sinus tachycardia with rate of 101 with left anterior fascicular block. Imaging: Portable chest x-ray showed bibasilar atelectasis or infiltrates.  The patient was given 2 g of IV magnesium sulfate, 125 mg of IV Solu-Medrol, DuoNeb and 2.5 mg of nebulized albuterol.  He will be admitted to a medical telemetry observation bed for further evaluation and management. PAST MEDICAL HISTORY:   Past Medical History:  Diagnosis Date   Arthritis    Bronchitis    Cancer (HCC)    metastatic NSCLC (followed by [facility])   COPD (chronic obstructive pulmonary disease) (HCC)    HTN (hypertension)     PAST SURGICAL HISTORY:   Past Surgical History:  Procedure Laterality Date   LUNG BIOPSY     TOTAL HIP  ARTHROPLASTY     TUMOR REMOVAL Right 12/04/2017    SOCIAL HISTORY:   Social History   Tobacco Use   Smoking status: Former    Current packs/day: 0.10    Types: Cigarettes   Smokeless tobacco: Never  Substance Use Topics   Alcohol use: Yes    Comment: occ    FAMILY HISTORY:  History reviewed. No pertinent family history.  DRUG ALLERGIES:   Allergies  Allergen Reactions   Ibuprofen Shortness Of Breath    REVIEW OF SYSTEMS:   ROS As per history of present illness. All pertinent systems were reviewed above. Constitutional, HEENT, cardiovascular, respiratory, GI, GU, musculoskeletal, neuro, psychiatric, endocrine, integumentary and hematologic systems were reviewed and are otherwise negative/unremarkable except for positive findings mentioned above in the HPI.   MEDICATIONS AT HOME:   Prior to Admission medications   Medication Sig Start Date End Date Taking? Authorizing Provider  albuterol (PROVENTIL) (2.5 MG/3ML) 0.083% nebulizer solution Inhale 3 mLs (2.5 mg total) into the lungs every 6 (six) hours as needed for wheezing or shortness of breath. 01/17/24   Azucena Fallen, MD  albuterol (VENTOLIN HFA) 108 (90 Base) MCG/ACT inhaler Inhale 2 puffs into the lungs every 6 (six) hours as needed. 11/01/23   Shon Hale, MD  azithromycin (ZITHROMAX) 500 MG tablet Take 1 tablet (500 mg total) by mouth daily. 01/17/24   Azucena Fallen, MD  BREZTRI AEROSPHERE 160-9-4.8 MCG/ACT AERO  Inhale 2 puffs into the lungs 2 (two) times daily. 11/01/23   Shon Hale, MD  clonazePAM (KLONOPIN) 1 MG tablet Take 1 tablet (1 mg total) by mouth 2 (two) times daily as needed for anxiety. 11/01/23   Shon Hale, MD  furosemide (LASIX) 40 MG tablet Take 0.5 tablets (20 mg total) by mouth See admin instructions. Take once a day  as needed for fluid/swelling 11/01/23   Shon Hale, MD  omeprazole (PRILOSEC) 40 MG capsule Take 1 capsule (40 mg total) by mouth daily. 11/01/23   Shon Hale, MD  oxyCODONE (ROXICODONE) 15 MG immediate release tablet Take 15 mg by mouth every 4 (four) hours as needed for pain.    [provider]  OXYGEN Inhale 3 L into the lungs continuous.     [provider]  predniSONE (DELTASONE) 10 MG tablet Take 4 tablets (40 mg total) by mouth daily for 3 days, THEN 3 tablets (30 mg total) daily for 3 days, THEN 2 tablets (20 mg total) daily for 3 days, THEN 1 tablet (10 mg total) daily for 3 days. 01/17/24 01/29/24  Azucena Fallen, MD      VITAL SIGNS:  Blood pressure 131/66, pulse 81, temperature 98.1 F (36.7 C), temperature source Oral, resp. rate (!) 22, height 5\' 7"  (1.702 m), weight 77.3 kg, SpO2 99%.  PHYSICAL EXAMINATION:  Physical Exam  GENERAL:  63 y.o.-year-old patient lying in the bed with mild respiratory distress with conversational dyspnea. EYES: Pupils equal, round, reactive to light and accommodation. No scleral icterus. Extraocular muscles intact.  HEENT: Head atraumatic, normocephalic. Oropharynx and nasopharynx clear.  NECK:  Supple, no jugular venous distention. No thyroid enlargement, no tenderness.  LUNGS: Diffuse expiratory wheezes with tight expiratory airflow and harsh vesicular breathing. No use of accessory muscles of respiration.  CARDIOVASCULAR: Regular rate and rhythm, S1, S2 normal. No murmurs, rubs, or gallops.  ABDOMEN: Soft, nondistended, nontender. Bowel sounds present. No organomegaly or mass.  EXTREMITIES: No pedal edema, cyanosis, or clubbing.  NEUROLOGIC: Cranial nerves II through XII are intact. Muscle strength 5/5 in all extremities. Sensation intact. Gait not checked.  PSYCHIATRIC: The patient is alert and oriented x 3.  Normal affect and good eye contact. SKIN: No obvious rash, lesion, or ulcer.   LABORATORY PANEL:   CBC Recent Labs  Lab 01/27/24 2040  WBC 6.9  HGB 12.1*  HCT 39.7  PLT 177    ------------------------------------------------------------------------------------------------------------------  Chemistries  Recent Labs  Lab 01/27/24 2040  NA 138  K 3.8  CL 91*  CO2 38*  GLUCOSE 200*  BUN 12  CREATININE 0.61  CALCIUM 8.7*  AST 15  ALT 12  ALKPHOS 56  BILITOT 0.8   ------------------------------------------------------------------------------------------------------------------  Cardiac Enzymes No results for input(s): "TROPONINI" in the last 168 hours. ------------------------------------------------------------------------------------------------------------------  RADIOLOGY:  DG Chest Port 1 View Result Date: 01/27/2024 CLINICAL DATA:  Chest pain and shortness of breath all day EXAM: PORTABLE CHEST 1 VIEW COMPARISON:  01/14/2024 FINDINGS: Stable cardiomediastinal silhouette. Bibasilar atelectasis or infiltrates. No pleural effusion or pneumothorax. No displaced rib fractures. IMPRESSION: Bibasilar atelectasis or infiltrates. Electronically Signed   By: Minerva Fester M.D.   On: 01/27/2024 20:55      IMPRESSION AND PLAN:  Assessment and Plan: * COPD exacerbation (HCC) - The patient will be admitted to a medically monitored bed. - We will place the patient IV steroid therapy with IV Solu-Medrol as well as nebulized bronchodilator therapy with duonebs q.i.d. and q.4 hours p.r.n.Marland Kitchen - Mucolytic  therapy will be provided with Mucinex and antibiotic therapy with IV Rocephin. - O2 protocol will be followed. - We will hold off long-acting beta agonist.   Essential hypertension - We will continue antihypertensive therapy.  GERD without esophagitis - We will continue PPI therapy.  Anxiety - We will continue Klonopin.   DVT prophylaxis: Lovenox.  Advanced Care Planning:  Code Status: full code.  Family Communication:  The plan of care was discussed in details with the patient (and family). I answered all questions. The patient agreed to proceed  with the above mentioned plan. Further management will depend upon hospital course. Disposition Plan: Back to previous home environment Consults called: none.  All the records are reviewed and case discussed with ED provider.  Status is: Observation  I certify that at the time of admission, it is my clinical judgment that the patient will require hospital care extending less than 2 midnights.                            Dispo: The patient is from: Home              Anticipated d/c is to: Home              Patient currently is not medically stable to d/c.              Difficult to place patient: No  Hannah Beat M.D on 01/28/2024 at 2:03 AM  [facility]   From 7 PM-7 AM, contact night-coverage [email or web address]  CC: Primary care physician; [name], NP

## 2024-01-27 NOTE — ED Provider Notes (Signed)
 [place] EMERGENCY DEPARTMENT AT [facility] Provider Note   CSN: [id number] Arrival date & time: 01/27/24  2010     History {Add pertinent medical, surgical, social history, OB history to HPI:1} Chief Complaint  Patient presents with   Chest Pain    Miguel Marsh is a 63 y.o. male.  Patient has a history of COPD and non-small cell lung cancer.  Patient complains of a cough for a few days and shortness of breath.  He is normally on 4 L nasal.  The history is provided by the patient. No language interpreter was used.  Shortness of Breath Severity:  Moderate Onset quality:  Sudden Timing:  Constant Progression:  Worsening Chronicity:  New Context: not activity   Relieved by:  Nothing Worsened by:  Nothing Ineffective treatments:  None tried Associated symptoms: no abdominal pain, no chest pain, no cough, no headaches and no rash        Home Medications Prior to Admission medications   Medication Sig Start Date End Date Taking? Authorizing Provider  albuterol (PROVENTIL) (2.5 MG/3ML) 0.083% nebulizer solution Inhale 3 mLs (2.5 mg total) into the lungs every 6 (six) hours as needed for wheezing or shortness of breath. 01/17/24   Azucena Fallen, MD  albuterol (VENTOLIN HFA) 108 (90 Base) MCG/ACT inhaler Inhale 2 puffs into the lungs every 6 (six) hours as needed. 11/01/23   Shon Hale, MD  azithromycin (ZITHROMAX) 500 MG tablet Take 1 tablet (500 mg total) by mouth daily. 01/17/24   Azucena Fallen, MD  BREZTRI AEROSPHERE 160-9-4.8 MCG/ACT AERO Inhale 2 puffs into the lungs 2 (two) times daily. 11/01/23   Shon Hale, MD  clonazePAM (KLONOPIN) 1 MG tablet Take 1 tablet (1 mg total) by mouth 2 (two) times daily as needed for anxiety. 11/01/23   Shon Hale, MD  furosemide (LASIX) 40 MG tablet Take 0.5 tablets (20 mg total) by mouth See admin instructions. Take once a day  as needed for fluid/swelling 11/01/23   Shon Hale, MD  omeprazole  (PRILOSEC) 40 MG capsule Take 1 capsule (40 mg total) by mouth daily. 11/01/23   Shon Hale, MD  oxyCODONE (ROXICODONE) 15 MG immediate release tablet Take 15 mg by mouth every 4 (four) hours as needed for pain.    [provider]  OXYGEN Inhale 3 L into the lungs continuous.     [provider]  predniSONE (DELTASONE) 10 MG tablet Take 4 tablets (40 mg total) by mouth daily for 3 days, THEN 3 tablets (30 mg total) daily for 3 days, THEN 2 tablets (20 mg total) daily for 3 days, THEN 1 tablet (10 mg total) daily for 3 days. 01/17/24 01/29/24  Azucena Fallen, MD      Allergies    Ibuprofen    Review of Systems   Review of Systems  Constitutional:  Negative for appetite change and fatigue.  HENT:  Negative for congestion, ear discharge and sinus pressure.   Eyes:  Negative for discharge.  Respiratory:  Positive for shortness of breath. Negative for cough.   Cardiovascular:  Negative for chest pain.  Gastrointestinal:  Negative for abdominal pain and diarrhea.  Genitourinary:  Negative for frequency and hematuria.  Musculoskeletal:  Negative for back pain.  Skin:  Negative for rash.  Neurological:  Negative for seizures and headaches.  Psychiatric/Behavioral:  Negative for hallucinations.     Physical Exam Updated Vital Signs BP (!) 164/93   Pulse 86   Temp 98.1 F (  36.7 C) (Axillary)   Resp 19   Ht 5\' 7"  (1.702 m)   Wt 76 kg   SpO2 100%   BMI 26.24 kg/m  Physical Exam Vitals and nursing note reviewed.  Constitutional:      Appearance: He is well-developed.  HENT:     Head: Normocephalic.     Nose: Nose normal.  Eyes:     General: No scleral icterus.    Conjunctiva/sclera: Conjunctivae normal.  Neck:     Thyroid: No thyromegaly.  Cardiovascular:     Rate and Rhythm: Normal rate and regular rhythm.     Heart sounds: No murmur heard.    No friction rub. No gallop.  Pulmonary:     Breath sounds: No stridor. Wheezing present. No rales.  Chest:      Chest wall: No tenderness.  Abdominal:     General: There is no distension.     Tenderness: There is no abdominal tenderness. There is no rebound.  Musculoskeletal:        General: Normal range of motion.     Cervical back: Neck supple.  Lymphadenopathy:     Cervical: No cervical adenopathy.  Skin:    Findings: No erythema or rash.  Neurological:     Mental Status: He is alert and oriented to person, place, and time.     Motor: No abnormal muscle tone.     Coordination: Coordination normal.  Psychiatric:        Behavior: Behavior normal.     ED Results / Procedures / Treatments   Labs (all labs ordered are listed, but only abnormal results are displayed) Labs Reviewed  CBC WITH DIFFERENTIAL/PLATELET - Abnormal; Notable for the following components:      Result Value   RBC 4.08 (*)    Hemoglobin 12.1 (*)    All other components within normal limits  COMPREHENSIVE METABOLIC PANEL WITH GFR - Abnormal; Notable for the following components:   Chloride 91 (*)    CO2 38 (*)    Glucose, Bld 200 (*)    Calcium 8.7 (*)    Total Protein 6.4 (*)    Albumin 3.3 (*)    All other components within normal limits  RESP PANEL BY RT-PCR (RSV, FLU A&B, COVID)  RVPGX2  BRAIN NATRIURETIC PEPTIDE  TROPONIN I (HIGH SENSITIVITY)  TROPONIN I (HIGH SENSITIVITY)    EKG EKG Interpretation Date/Time:  Sunday January 27 2024 20:19:45 EDT Ventricular Rate:  101 PR Interval:  131 QRS Duration:  80 QT Interval:  321 QTC Calculation: 416 R Axis:   -79  Text Interpretation: Sinus tachycardia Left anterior fascicular block Confirmed by Bethann Berkshire [phone]) on 01/27/2024 10:18:38 PM  Radiology DG Chest Port 1 View Result Date: 01/27/2024 CLINICAL DATA:  Chest pain and shortness of breath all day EXAM: PORTABLE CHEST 1 VIEW COMPARISON:  01/14/2024 FINDINGS: Stable cardiomediastinal silhouette. Bibasilar atelectasis or infiltrates. No pleural effusion or pneumothorax. No displaced rib fractures.  IMPRESSION: Bibasilar atelectasis or infiltrates. Electronically Signed   By: Minerva Fester M.D.   On: 01/27/2024 20:55    Procedures Procedures  {Document cardiac monitor, telemetry assessment procedure when appropriate:1}  Medications Ordered in ED Medications  ipratropium-albuterol (DUONEB) 0.5-2.5 (3) MG/3ML nebulizer solution 3 mL (3 mLs Nebulization Given 01/27/24 2248)  albuterol (PROVENTIL) (2.5 MG/3ML) 0.083% nebulizer solution 2.5 mg (2.5 mg Nebulization Given 01/27/24 2248)  methylPREDNISolone sodium succinate (SOLU-MEDROL) 125 mg/2 mL injection 125 mg (125 mg Intravenous Given 01/27/24 2239)  magnesium sulfate IVPB 2  g 50 mL (2 g Intravenous New Bag/Given 01/27/24 2239)    ED Course/ Medical Decision Making/ A&P   {Patient with COPD exacerbation.  Patient improved some with neb treatments.  Patient still having wheezing and complains of shortness of breath. Click here for ABCD2, HEART and other calculatorsREFRESH Note before signing :1}                              Medical Decision Making Amount and/or Complexity of Data Reviewed Labs: ordered. Radiology: ordered.  Risk Prescription drug management. Decision regarding hospitalization.   Patient with COPD exacerbation.  he will be admitted to medicine  {Document critical care time when appropriate:1} {Document review of labs and clinical decision tools ie heart score, Chads2Vasc2 etc:1}  {Document your independent review of radiology images, and any outside records:1} {Document your discussion with family members, caretakers, and with consultants:1} {Document social determinants of health affecting pt's care:1} {Document your decision making why or why not admission, treatments were needed:1} Final Clinical Impression(s) / ED Diagnoses Final diagnoses:  COPD exacerbation (HCC)    Rx / DC Orders ED Discharge Orders     None

## 2024-01-28 DIAGNOSIS — Z9981 Dependence on supplemental oxygen: Secondary | ICD-10-CM | POA: Diagnosis not present

## 2024-01-28 DIAGNOSIS — R000 Tachycardia, unspecified: Secondary | ICD-10-CM | POA: Diagnosis present

## 2024-01-28 DIAGNOSIS — Z1152 Encounter for screening for COVID-19: Secondary | ICD-10-CM | POA: Diagnosis not present

## 2024-01-28 DIAGNOSIS — Z886 Allergy status to analgesic agent status: Secondary | ICD-10-CM | POA: Diagnosis not present

## 2024-01-28 DIAGNOSIS — Z716 Tobacco abuse counseling: Secondary | ICD-10-CM | POA: Diagnosis not present

## 2024-01-28 DIAGNOSIS — F17210 Nicotine dependence, cigarettes, uncomplicated: Secondary | ICD-10-CM | POA: Diagnosis present

## 2024-01-28 DIAGNOSIS — F1490 Cocaine use, unspecified, uncomplicated: Secondary | ICD-10-CM | POA: Diagnosis present

## 2024-01-28 DIAGNOSIS — G8929 Other chronic pain: Secondary | ICD-10-CM | POA: Diagnosis present

## 2024-01-28 DIAGNOSIS — Z96641 Presence of right artificial hip joint: Secondary | ICD-10-CM | POA: Diagnosis present

## 2024-01-28 DIAGNOSIS — J479 Bronchiectasis, uncomplicated: Secondary | ICD-10-CM | POA: Diagnosis present

## 2024-01-28 DIAGNOSIS — Z720 Tobacco use: Secondary | ICD-10-CM | POA: Diagnosis not present

## 2024-01-28 DIAGNOSIS — K219 Gastro-esophageal reflux disease without esophagitis: Secondary | ICD-10-CM | POA: Diagnosis present

## 2024-01-28 DIAGNOSIS — I10 Essential (primary) hypertension: Secondary | ICD-10-CM | POA: Diagnosis present

## 2024-01-28 DIAGNOSIS — F17200 Nicotine dependence, unspecified, uncomplicated: Secondary | ICD-10-CM | POA: Diagnosis not present

## 2024-01-28 DIAGNOSIS — J9621 Acute and chronic respiratory failure with hypoxia: Secondary | ICD-10-CM | POA: Diagnosis present

## 2024-01-28 DIAGNOSIS — Z85118 Personal history of other malignant neoplasm of bronchus and lung: Secondary | ICD-10-CM | POA: Diagnosis not present

## 2024-01-28 DIAGNOSIS — F419 Anxiety disorder, unspecified: Secondary | ICD-10-CM | POA: Diagnosis present

## 2024-01-28 DIAGNOSIS — J441 Chronic obstructive pulmonary disease with (acute) exacerbation: Secondary | ICD-10-CM | POA: Diagnosis present

## 2024-01-28 LAB — BASIC METABOLIC PANEL WITH GFR
Anion gap: 8 (ref 5–15)
BUN: 11 mg/dL (ref 8–23)
CO2: 36 mmol/L — ABNORMAL HIGH (ref 22–32)
Calcium: 8.8 mg/dL — ABNORMAL LOW (ref 8.9–10.3)
Chloride: 96 mmol/L — ABNORMAL LOW (ref 98–111)
Creatinine, Ser: 0.47 mg/dL — ABNORMAL LOW (ref 0.61–1.24)
GFR, Estimated: >60 mL/min (ref >60)
Glucose, Bld: 165 mg/dL — ABNORMAL HIGH (ref 70–99)
Potassium: 4.8 mmol/L (ref 3.5–5.1)
Sodium: 140 mmol/L (ref 135–145)

## 2024-01-28 LAB — RAPID URINE DRUG SCREEN, HOSP PERFORMED
Amphetamines: NOT DETECTED
Barbiturates: NOT DETECTED
Benzodiazepines: NOT DETECTED
Cocaine: POSITIVE — AB
Opiates: NOT DETECTED
Tetrahydrocannabinol: NOT DETECTED

## 2024-01-28 LAB — CBC
HCT: 39.2 % (ref 39.0–52.0)
Hemoglobin: 12.1 g/dL — ABNORMAL LOW (ref 13.0–17.0)
MCH: 29.5 pg (ref 26.0–34.0)
MCHC: 30.9 g/dL (ref 30.0–36.0)
MCV: 95.6 fL (ref 80.0–100.0)
Platelets: 179 10*3/uL (ref 150–400)
RBC: 4.1 MIL/uL — ABNORMAL LOW (ref 4.22–5.81)
RDW: 12 % (ref 11.5–15.5)
WBC: 5.5 10*3/uL (ref 4.0–10.5)
nRBC: 0 % (ref 0.0–0.2)

## 2024-01-28 LAB — EXPECTORATED SPUTUM ASSESSMENT W GRAM STAIN, RFLX TO RESP C

## 2024-01-28 MED ORDER — DM-GUAIFENESIN ER 30-600 MG PO TB12
1.0000 | ORAL_TABLET | Freq: Two times a day (BID) | ORAL | Status: DC
  Administered 2024-01-28 – 2024-01-31 (×7): 1 via ORAL
  Filled 2024-01-28 (×7): qty 1

## 2024-01-28 MED ORDER — ONDANSETRON HCL 4 MG PO TABS: 4.0000 mg | ORAL_TABLET | Freq: Four times a day (QID) | ORAL | Status: DC | PRN

## 2024-01-28 MED ORDER — PREDNISONE 20 MG PO TABS: 40.0000 mg | ORAL_TABLET | Freq: Every day | ORAL | Status: DC

## 2024-01-28 MED ORDER — ENOXAPARIN SODIUM 40 MG/0.4ML IJ SOSY
40.0000 mg | PREFILLED_SYRINGE | INTRAMUSCULAR | Status: DC
  Administered 2024-01-28 – 2024-01-31 (×4): 40 mg via SUBCUTANEOUS
  Filled 2024-01-28 (×4): qty 0.4

## 2024-01-28 MED ORDER — SODIUM CHLORIDE 0.9 % IV SOLN: INTRAVENOUS | Status: DC

## 2024-01-28 MED ORDER — METHYLPREDNISOLONE SODIUM SUCC 40 MG IJ SOLR
40.0000 mg | Freq: Two times a day (BID) | INTRAMUSCULAR | Status: DC
  Administered 2024-01-28: 40 mg via INTRAVENOUS
  Filled 2024-01-28: qty 1

## 2024-01-28 MED ORDER — OXYCODONE HCL 5 MG PO TABS
15.0000 mg | ORAL_TABLET | Freq: Four times a day (QID) | ORAL | Status: DC | PRN
  Administered 2024-01-28 – 2024-01-30 (×5): 15 mg via ORAL
  Filled 2024-01-28 (×5): qty 3

## 2024-01-28 MED ORDER — CLONAZEPAM 0.5 MG PO TABS
1.0000 mg | ORAL_TABLET | Freq: Two times a day (BID) | ORAL | Status: DC | PRN
  Administered 2024-01-28 – 2024-01-30 (×3): 1 mg via ORAL
  Filled 2024-01-28 (×3): qty 2

## 2024-01-28 MED ORDER — METHYLPREDNISOLONE SODIUM SUCC 40 MG IJ SOLR
40.0000 mg | Freq: Two times a day (BID) | INTRAMUSCULAR | Status: DC
  Administered 2024-01-28 – 2024-01-31 (×6): 40 mg via INTRAVENOUS
  Filled 2024-01-28 (×7): qty 1

## 2024-01-28 MED ORDER — TRAZODONE HCL 50 MG PO TABS
25.0000 mg | ORAL_TABLET | Freq: Every evening | ORAL | Status: DC | PRN
  Filled 2024-01-28: qty 1

## 2024-01-28 MED ORDER — ACETAMINOPHEN 650 MG RE SUPP: 650.0000 mg | Freq: Four times a day (QID) | RECTAL | Status: DC | PRN

## 2024-01-28 MED ORDER — ACETAMINOPHEN 325 MG PO TABS: 650.0000 mg | ORAL_TABLET | Freq: Four times a day (QID) | ORAL | Status: DC | PRN

## 2024-01-28 MED ORDER — MAGNESIUM HYDROXIDE 400 MG/5ML PO SUSP: 30.0000 mL | Freq: Every day | ORAL | Status: DC | PRN

## 2024-01-28 MED ORDER — SODIUM CHLORIDE 0.9 % IV SOLN
1.0000 g | INTRAVENOUS | Status: DC
  Administered 2024-01-28 – 2024-01-29 (×2): 1 g via INTRAVENOUS
  Filled 2024-01-28 (×2): qty 10

## 2024-01-28 MED ORDER — ONDANSETRON HCL 4 MG/2ML IJ SOLN: 4.0000 mg | Freq: Four times a day (QID) | INTRAMUSCULAR | Status: DC | PRN

## 2024-01-28 NOTE — Assessment & Plan Note (Addendum)
-   The patient will be admitted to a medically monitored bed. - We will place the patient IV steroid therapy with IV Solu-Medrol as well as nebulized bronchodilator therapy with duonebs q.i.d. and q.4 hours p.r.n.Marland Kitchen - Mucolytic therapy will be provided with Mucinex and antibiotic therapy with IV Rocephin. - O2 protocol will be followed. - We will hold off long-acting beta agonist.

## 2024-01-28 NOTE — Assessment & Plan Note (Signed)
 We will continue Klonopin  ?

## 2024-01-28 NOTE — ED Notes (Signed)
 ED TO INPATIENT HANDOFF REPORT  ED Nurse Name and Phone #:   S Name/Age/Gender Miguel Marsh 63 y.o. male Room/Bed: APA07/APA07  Code Status   Code Status: Prior  Home/SNF/Other Home Patient oriented to: self, place, time, and situation Is this baseline? Yes   Triage Complete: Triage complete  Chief Complaint COPD exacerbation (HCC) [J44.1]  Triage Note Pt c/o chest pain and sob all day.    Allergies Allergies  Allergen Reactions   Ibuprofen Shortness Of Breath    Level of Care/Admitting Diagnosis ED Disposition     ED Disposition  Admit   Condition  --   Comment  Hospital Area: [facility] [[id number]]  Level of Care: Telemetry [5]  Covid Evaluation: Confirmed COVID Negative  Diagnosis: COPD exacerbation [facility]) [[id number]]  Admitting Physician: Hannah Beat [[id number]]  Attending Physician: Hannah Beat [[id number]]          B Medical/Surgery History Past Medical History:  Diagnosis Date   Arthritis    Bronchitis    Cancer (HCC)    metastatic NSCLC (followed by [facility])   COPD (chronic obstructive pulmonary disease) (HCC)    HTN (hypertension)    Past Surgical History:  Procedure Laterality Date   LUNG BIOPSY     TOTAL HIP ARTHROPLASTY     TUMOR REMOVAL Right 12/04/2017     A IV Location/Drains/Wounds Patient Lines/Drains/Airways Status     Active Line/Drains/Airways     Name Placement date Placement time Site Days   Peripheral IV 01/27/24 20 G Anterior;Distal;Left;Upper Arm 01/27/24  2024  Arm  1            Intake/Output Last 24 hours No intake or output data in the 24 hours ending 01/28/24 0005  Labs/Imaging Results for orders placed or performed during the hospital encounter of 01/27/24 (from the past 48 hours)  CBC with Differential     Status: Abnormal   Collection Time: 01/27/24  8:40 PM  Result Value Ref Range   WBC 6.9 4.0 - 10.5 K/uL   RBC 4.08 (L) 4.22 - 5.81 MIL/uL   Hemoglobin 12.1 (L) 13.0 - 17.0 g/dL   HCT 40.3  47.4 - 25.9 %   MCV 97.3 80.0 - 100.0 fL   MCH 29.7 26.0 - 34.0 pg   MCHC 30.5 30.0 - 36.0 g/dL   RDW 56.3 87.5 - 64.3 %   Platelets 177 150 - 400 K/uL   nRBC 0.0 0.0 - 0.2 %   Neutrophils Relative % 74 %   Neutro Abs 5.1 1.7 - 7.7 K/uL   Lymphocytes Relative 12 %   Lymphs Abs 0.9 0.7 - 4.0 K/uL   Monocytes Relative 12 %   Monocytes Absolute 0.8 0.1 - 1.0 K/uL   Eosinophils Relative 2 %   Eosinophils Absolute 0.1 0.0 - 0.5 K/uL   Basophils Relative 0 %   Basophils Absolute 0.0 0.0 - 0.1 K/uL   Immature Granulocytes 0 %   Abs Immature Granulocytes 0.02 0.00 - 0.07 K/uL    Comment: Performed at [facility], [address], [place], [place] [address]  Comprehensive metabolic panel     Status: Abnormal   Collection Time: 01/27/24  8:40 PM  Result Value Ref Range   Sodium 138 135 - 145 mmol/L   Potassium 3.8 3.5 - 5.1 mmol/L   Chloride 91 (L) 98 - 111 mmol/L   CO2 38 (H) 22 - 32 mmol/L   Glucose, Bld 200 (H) 70 - 99 mg/dL  Comment: Glucose reference range applies only to samples taken after fasting for at least 8 hours.   BUN 12 8 - 23 mg/dL   Creatinine, Ser 4.78 0.61 - 1.24 mg/dL   Calcium 8.7 (L) 8.9 - 10.3 mg/dL   Total Protein 6.4 (L) 6.5 - 8.1 g/dL   Albumin 3.3 (L) 3.5 - 5.0 g/dL   AST 15 15 - 41 U/L   ALT 12 0 - 44 U/L   Alkaline Phosphatase 56 38 - 126 U/L   Total Bilirubin 0.8 0.0 - 1.2 mg/dL   GFR, Estimated >29 >56 mL/min    Comment: (NOTE) Calculated using the CKD-EPI Creatinine Equation (2021)    Anion gap 9 5 - 15    Comment: Performed at [facility], [address], [place], [place] [address]  Troponin I (High Sensitivity)     Status: None   Collection Time: 01/27/24  8:40 PM  Result Value Ref Range   Troponin I (High Sensitivity) 6 <18 ng/L    Comment: (NOTE) Elevated high sensitivity troponin I (hsTnI) values and significant  changes across serial measurements may suggest ACS but many other  chronic and acute conditions are known to elevate hsTnI  results.  Refer to the "Links" section for chest pain algorithms and additional  guidance. Performed at [facility], [address] [place], [place] [address]   Brain natriuretic peptide     Status: None   Collection Time: 01/27/24  8:40 PM  Result Value Ref Range   B Natriuretic Peptide 20.0 0.0 - 100.0 pg/mL    Comment: Performed at [facility], [address], [place], [place] [address]  Resp panel by RT-PCR (RSV, Flu A&B, Covid) Anterior Nasal Swab     Status: None   Collection Time: 01/27/24 10:25 PM   Specimen: Anterior Nasal Swab  Result Value Ref Range   SARS Coronavirus 2 by RT PCR NEGATIVE NEGATIVE    Comment: (NOTE) SARS-CoV-2 target nucleic acids are NOT DETECTED.  The SARS-CoV-2 RNA is generally detectable in upper respiratory specimens during the acute phase of infection. The lowest concentration of SARS-CoV-2 viral copies this assay can detect is 138 copies/mL. A negative result does not preclude SARS-Cov-2 infection and should not be used as the sole basis for treatment or other patient management decisions. A negative result may occur with  improper specimen collection/handling, submission of specimen other than nasopharyngeal swab, presence of viral mutation(s) within the areas targeted by this assay, and inadequate number of viral copies(<138 copies/mL). A negative result must be combined with clinical observations, patient history, and epidemiological information. The expected result is Negative.  Fact Sheet for Patients:  BloggerCourse.com  Fact Sheet for Healthcare Providers:  SeriousBroker.it  This test is no t yet approved or cleared by the [place] FDA and  has been authorized for detection and/or diagnosis of SARS-CoV-2 by FDA under an Emergency Use Authorization (EUA). This EUA will remain  in effect (meaning this test can be used) for the duration of the COVID-19 declaration under Section  564(b)(1) of the Act, 21 U.S.C.section 360bbb-3(b)(1), unless the authorization is terminated  or revoked sooner.       Influenza A by PCR NEGATIVE NEGATIVE   Influenza B by PCR NEGATIVE NEGATIVE    Comment: (NOTE) The Xpert Xpress SARS-CoV-2/FLU/RSV plus assay is intended as an aid in the diagnosis of influenza from Nasopharyngeal swab specimens and should not be used as a sole basis for treatment. Nasal washings and aspirates are unacceptable for  Xpert Xpress SARS-CoV-2/FLU/RSV testing.  Fact Sheet for Patients: BloggerCourse.com  Fact Sheet for Healthcare Providers: SeriousBroker.it  This test is not yet approved or cleared by the [place] FDA and has been authorized for detection and/or diagnosis of SARS-CoV-2 by FDA under an Emergency Use Authorization (EUA). This EUA will remain in effect (meaning this test can be used) for the duration of the COVID-19 declaration under Section 564(b)(1) of the Act, 21 U.S.C. section 360bbb-3(b)(1), unless the authorization is terminated or revoked.     Resp Syncytial Virus by PCR NEGATIVE NEGATIVE    Comment: (NOTE) Fact Sheet for Patients: BloggerCourse.com  Fact Sheet for Healthcare Providers: SeriousBroker.it  This test is not yet approved or cleared by the [place] FDA and has been authorized for detection and/or diagnosis of SARS-CoV-2 by FDA under an Emergency Use Authorization (EUA). This EUA will remain in effect (meaning this test can be used) for the duration of the COVID-19 declaration under Section 564(b)(1) of the Act, 21 U.S.C. section 360bbb-3(b)(1), unless the authorization is terminated or revoked.  Performed at [facility], [address], [place], [place] [address]   Troponin I (High Sensitivity)     Status: None   Collection Time: 01/27/24 10:58 PM  Result Value Ref Range   Troponin I (High  Sensitivity) 6 <18 ng/L    Comment: (NOTE) Elevated high sensitivity troponin I (hsTnI) values and significant  changes across serial measurements may suggest ACS but many other  chronic and acute conditions are known to elevate hsTnI results.  Refer to the "Links" section for chest pain algorithms and additional  guidance. Performed at [facility], [address], [place], [place] [address]    DG Chest Port 1 View Result Date: 01/27/2024 CLINICAL DATA:  Chest pain and shortness of breath all day EXAM: PORTABLE CHEST 1 VIEW COMPARISON:  01/14/2024 FINDINGS: Stable cardiomediastinal silhouette. Bibasilar atelectasis or infiltrates. No pleural effusion or pneumothorax. No displaced rib fractures. IMPRESSION: Bibasilar atelectasis or infiltrates. Electronically Signed   By: Minerva Fester M.D.   On: 01/27/2024 20:55    Pending Labs Unresulted Labs (From admission, onward)    None       Vitals/Pain Today's Vitals   01/27/24 2145 01/27/24 2230 01/27/24 2248 01/27/24 2330  BP: (!) 140/81 (!) 164/93  (!) 143/74  Pulse: 92 86  85  Resp: 15 19  17   Temp:    98.1 F (36.7 C)  TempSrc:    Axillary  SpO2: 98% 99% 100% 96%  Weight:      Height:      PainSc:        Isolation Precautions No active isolations  Medications Medications  ipratropium-albuterol (DUONEB) 0.5-2.5 (3) MG/3ML nebulizer solution 3 mL (3 mLs Nebulization Given 01/27/24 2248)  albuterol (PROVENTIL) (2.5 MG/3ML) 0.083% nebulizer solution 2.5 mg (2.5 mg Nebulization Given 01/27/24 2248)  methylPREDNISolone sodium succinate (SOLU-MEDROL) 125 mg/2 mL injection 125 mg (125 mg Intravenous Given 01/27/24 2239)  magnesium sulfate IVPB 2 g 50 mL (0 g Intravenous Stopped 01/28/24 0003)    Mobility walks     Focused Assessments    R Recommendations: See Admitting Provider Note  Report given to:   Additional Notes:

## 2024-01-28 NOTE — Assessment & Plan Note (Signed)
-   We will continue antihypertensive therapy.

## 2024-01-28 NOTE — TOC Initial Note (Signed)
 Transition of Care [facility]) - Initial/Assessment Note    Patient Details  Name: Miguel Marsh MRN: [id number] Date of Birth: 18-May-1961  Transition of Care [facility]) CM/SW Contact:    Karn Cassis, LCSW Phone Number: 01/28/2024, 12:59 PM  Clinical Narrative:  Pt admitted for COPD exacerbation. Assessment completed due to high risk readmission score. Pt reports he lives alone but has a CAP aid several hours a day, 7 days a week. He is on 3L home O2 (unsure on agency). [facility] provides transportation to appointments. Pt plans to return home when medically stable. No needs reported at this time. TOC will follow.                                 Expected Discharge Plan: Home/Self Care Barriers to Discharge: Continued Medical Work up   Patient Goals and CMS Choice Patient states their goals for this hospitalization and ongoing recovery are:: return home   Choice offered to / list presented to : Patient [place] ownership interest in [facility]provided to::  (n/a)    Expected Discharge Plan and Services In-house Referral: Clinical Social Work     Living arrangements for the past 2 months: Apartment                                      Prior Living Arrangements/Services Living arrangements for the past 2 months: Apartment Lives with:: Self Patient language and need for interpreter reviewed:: Yes Do you feel safe going back to the place where you live?: Yes      Need for Family Participation in Patient Care: No (Comment)   Current home services: DME (wheelchair, home O2) Criminal Activity/Legal Involvement Pertinent to Current Situation/Hospitalization: No - Comment as needed  Activities of Daily Living      Permission Sought/Granted                  Emotional Assessment     Affect (typically observed): Appropriate Orientation: : Oriented to Self, Oriented to Place, Oriented to  Time, Oriented to Situation Alcohol / Substance Use: Not  Applicable Psych Involvement: No (comment)  Admission diagnosis:  COPD exacerbation (HCC) [J44.1] Patient Active Problem List   Diagnosis Date Noted   Anxiety 01/15/2024   GERD without esophagitis 01/15/2024   Cocaine abuse (HCC) 09/13/2023   CAP (community acquired pneumonia) 06/24/2023   Lung nodule 06/24/2023   COVID-19 virus infection 06/24/2023   Elevated brain natriuretic peptide (BNP) level 03/06/2023   Elevated MCV 03/06/2023   Acute metabolic encephalopathy 02/01/2023   Leukocytosis 10/06/2020   Goals of care, counseling/discussion    Palliative care by specialist    DNR (do not resuscitate) discussion    Hyperglycemia 04/12/2020   Chronic respiratory failure with hypoxia and hypercapnia (HCC) 01/22/2020   Acute respiratory failure (HCC) 01/23/2018   Acute on chronic respiratory failure with hypoxia and hypercapnia (HCC) 01/23/2018   Acute on chronic respiratory failure with hypoxia (HCC) 01/01/2018   Obesity, Class III, BMI 40-49.9 (morbid obesity) (HCC) 01/01/2018   Acute exacerbation of chronic obstructive pulmonary disease (COPD) (HCC) 01/01/2018   Essential hypertension 12/09/2015   Non-small cell cancer of right lung (in remission) 12/09/2015   GERD (gastroesophageal reflux disease) 12/09/2015   Tobacco use disorder 12/17/2014   COPD exacerbation (HCC) 12/17/2014   Obesity 12/17/2014   Dyspnea  Difficulty walking 04/02/2013   Hip weakness 04/02/2013   PCP:  [name], NP Pharmacy:   [facility] [phone] - [place], [place] - [address] AT [facility] [address] [place] [address] Phone: [phone] Fax: [phone]     Social Drivers of Health (SDOH) Social History: SDOH Screenings   Food Insecurity: Patient Declined (01/28/2024)  Housing: Low Risk  (01/28/2024)  Transportation Needs: No Transportation Needs (01/28/2024)  Utilities: Not At Risk (01/28/2024)  Tobacco Use: Medium Risk (01/27/2024)    SDOH Interventions:     Readmission Risk Interventions    01/28/2024   12:56 PM 01/15/2024    8:41 AM 10/29/2023    4:27 PM  Readmission Risk Prevention Plan  Transportation Screening Complete Complete Complete  Medication Review Oceanographer) Complete Complete Complete  HRI or Home Care Consult Complete Complete Complete  SW Recovery Care/Counseling Consult Complete Complete Complete  Palliative Care Screening Not Applicable Not Applicable Not Applicable  Skilled Nursing Facility Not Applicable Not Applicable Patient Refused

## 2024-01-28 NOTE — Progress Notes (Signed)
 [facility] PROGRESS NOTE  CHARLE CLEAR (DOB: 06-30-1961) ZOX:[id number] PCP: [name], NP  Brief Narrative: BOBBYE REINITZ is a 63 y.o. male with a history of 2L O2-dependent COPD, HTN, metastatic NSCLC, cocaine use who presented to the ED on 01/27/2024 with worsening shortness of breath and wheezing. He was tachycardic and tachypneic, afebrile, and hypoxemic with bibasilar opacities (infiltrate vs. atelectasis) on CXR. Viral panel was negative. WBC, BNP and troponin were all within normal limits. He was given IV steroid, nebulized therapies and readmitted for AECOPD (had been admitted 3/17-3/20 for same).   Subjective: Breathing a bit better this morning, but not back to baseline. Still some wheezing.   Objective: BP 135/82   Pulse 90   Temp 98 F (36.7 C) (Oral)   Resp 16   Ht 5\' 7"  (1.702 m)   Wt 77.3 kg   SpO2 99%   BMI 26.69 kg/m   Gen: No distress Pulm: Diminished without wheezes this morning, slight rhonchi at bases improves with cough and deep breath  CV: RRR, no MRG GI: Soft, NT, ND, +BS  Neuro: Alert and oriented. No new focal deficits. Ext: Warm, no deformities. Skin: No rashes, lesions or ulcers on visualized skin   Assessment & Plan: Principal Problem:   COPD exacerbation (HCC) Active Problems:   Essential hypertension   Anxiety   GERD without esophagitis  Chronic hypoxic respiratory failure with acute respiratory distress due to AECOPD:  - Continue supplemental oxygen to maintain adequate saturations and normal respiratory effort.  - Continue IV steroid, scheduled and prn bronchodilators - Sputum culture. Continue ceftriaxone for now, however with CT evidence of bronchiectasis and very rapid readmission, could consider respiratory quinolone. - Mucinex DM  History of cocaine use:  - UDS +, cessation counseling provided  HTN:  - Avoid beta blocker  Anxiety:  - Continue clonazepam at dose prescribed consistently per PDMP  review  Chronic pain:  - Continue oxycodone as confirmed by PDMP review this morning.   GERD:  - Continue PPI  Tyrone Nine, MD [facility] [email or web address] 01/28/2024, 5:44 PM

## 2024-01-28 NOTE — Progress Notes (Signed)
 Transition of Care Department [facility]) has reviewed patient and no other TOC needs have been identified at this time. We will continue to monitor patient advancement through interdisciplinary progression rounds. If new patient transition needs arise, please place a TOC consult.   01/28/24 0803  TOC Brief Assessment  Insurance and Status Reviewed  Patient has primary care physician Yes  Home environment has been reviewed Lives alone.  Prior level of function: Has CAP aide about 3 hours a day.  Prior/Current Home Services No current home services  Social Drivers of Health Review SDOH reviewed no interventions necessary  Readmission risk has been reviewed Yes  Transition of care needs no transition of care needs at this time

## 2024-01-28 NOTE — Plan of Care (Signed)
  Problem: Education: Goal: Knowledge of General Education information will improve Description: Including pain rating scale, medication(s)/side effects and non-pharmacologic comfort measures Outcome: Progressing   Problem: Health Behavior/Discharge Planning: Goal: Ability to manage health-related needs will improve Outcome: Progressing   Problem: Clinical Measurements: Goal: Ability to maintain clinical measurements within normal limits will improve Outcome: Progressing Goal: Will remain free from infection Outcome: Progressing Goal: Diagnostic test results will improve Outcome: Progressing Goal: Respiratory complications will improve Outcome: Progressing Goal: Cardiovascular complication will be avoided Outcome: Progressing   Problem: Activity: Goal: Risk for activity intolerance will decrease Outcome: Progressing   Problem: Nutrition: Goal: Adequate nutrition will be maintained Outcome: Progressing   Problem: Coping: Goal: Level of anxiety will decrease Outcome: Progressing   Problem: Pain Managment: Goal: General experience of comfort will improve and/or be controlled Outcome: Progressing   Problem: Safety: Goal: Ability to remain free from injury will improve Outcome: Progressing   Problem: Skin Integrity: Goal: Risk for impaired skin integrity will decrease Outcome: Progressing   Problem: Education: Goal: Knowledge of disease or condition will improve Outcome: Progressing Goal: Knowledge of the prescribed therapeutic regimen will improve Outcome: Progressing Goal: Individualized Educational Video(s) Outcome: Progressing   Problem: Activity: Goal: Ability to tolerate increased activity will improve Outcome: Progressing Goal: Will verbalize the importance of balancing activity with adequate rest periods Outcome: Progressing   Problem: Respiratory: Goal: Ability to maintain a clear airway will improve Outcome: Progressing Goal: Levels of oxygenation  will improve Outcome: Progressing Goal: Ability to maintain adequate ventilation will improve Outcome: Progressing

## 2024-01-28 NOTE — Assessment & Plan Note (Signed)
 -  We will continue PPI therapy

## 2024-01-29 DIAGNOSIS — J441 Chronic obstructive pulmonary disease with (acute) exacerbation: Secondary | ICD-10-CM | POA: Diagnosis not present

## 2024-01-29 MED ORDER — ALBUTEROL SULFATE (2.5 MG/3ML) 0.083% IN NEBU
2.5000 mg | INHALATION_SOLUTION | Freq: Four times a day (QID) | RESPIRATORY_TRACT | Status: DC | PRN
  Administered 2024-01-29 – 2024-01-30 (×2): 2.5 mg via RESPIRATORY_TRACT
  Filled 2024-01-29 (×2): qty 3

## 2024-01-29 MED ORDER — LEVOFLOXACIN IN D5W 750 MG/150ML IV SOLN
750.0000 mg | INTRAVENOUS | Status: DC
  Administered 2024-01-29 – 2024-01-31 (×3): 750 mg via INTRAVENOUS
  Filled 2024-01-29 (×3): qty 150

## 2024-01-29 MED ORDER — ORAL CARE MOUTH RINSE: 15.0000 mL | OROMUCOSAL | Status: DC | PRN

## 2024-01-29 NOTE — Plan of Care (Signed)
 ?  Problem: Education: ?Goal: Knowledge of General Education information will improve ?Description: Including pain rating scale, medication(s)/side effects and non-pharmacologic comfort measures ?Outcome: Progressing ?  ?Problem: Health Behavior/Discharge Planning: ?Goal: Ability to manage health-related needs will improve ?Outcome: Progressing ?  ?Problem: Coping: ?Goal: Level of anxiety will decrease ?Outcome: Progressing ?  ?

## 2024-01-29 NOTE — Progress Notes (Signed)
 SATURATION QUALIFICATIONS: (This note is used to comply with regulatory documentation for home oxygen)  Patient Saturations on Room Air at Rest = 90%  Patient Saturations on Room Air while Ambulating = 86%  Patient Saturations on 4 Liters of oxygen while Ambulating = 88%  Please briefly explain why patient needs home oxygen:  Pt became dizzy and weak after ambulating ~79ft on room air. Pt needed to take a break and be placed on O2 prior to walking back to his room.

## 2024-01-29 NOTE — Progress Notes (Signed)
 [facility] PROGRESS NOTE  RAS KOLLMAN (DOB: 1961-04-08) [id number]:[id number] PCP: [name], NP  Brief Narrative: Miguel Marsh is a 63 y.o. male with a history of 2L O2-dependent COPD, HTN, metastatic NSCLC, cocaine use who presented to the ED on 01/27/2024 with worsening shortness of breath and wheezing. He was tachycardic and tachypneic, afebrile, and hypoxemic with bibasilar opacities (infiltrate vs. atelectasis) on CXR. Viral panel was negative. WBC, BNP and troponin were all within normal limits. He was given IV steroid, nebulized therapies and readmitted for AECOPD (had been admitted 3/17-3/20 for same).   Subjective: Feels no significant improvement since arrival. [place] a bit this morning and found it very difficult due to shortness of breath.   Objective: BP 118/62 (BP Location: Right Arm)   Pulse 90   Temp 98.2 F (36.8 C) (Oral)   Resp 16   Ht 5\' 7"  (1.702 m)   Wt 77.3 kg   SpO2 99%   BMI 26.69 kg/m   Gen: No distress Pulm: Tachypneic at times with pursed lip breathing, rhonchi at bases stable, no wheezing at this time  CV: RRR, no MRG or edema GI: Soft, NT, ND, +BS  Neuro: Alert and oriented. No new focal deficits. Ext: Warm, no deformities. Skin: No new rashes, lesions or ulcers on visualized skin   Assessment & Plan: Chronic hypoxic respiratory failure with acute respiratory distress due to AECOPD:  - Continue supplemental oxygen to maintain adequate saturations and normal respiratory effort.  - Continue IV steroid, scheduled and prn bronchodilators - Sputum culture pending, GPC and GNR and yeast noted thus far. Given the CT evidence of bronchiectasis and very rapid readmission, will change to respiratory quinolone. - Mucinex DM  History of cocaine use:  - UDS +, cessation counseling provided  HTN:  - Avoid beta blocker  Anxiety:  - Continue clonazepam at dose prescribed consistently per PDMP review  Chronic pain:  - Continue oxycodone as  confirmed by PDMP review this morning.   GERD:  - Continue PPI  Tyrone Nine, MD [facility] [email or web address] 01/29/2024, 11:54 AM

## 2024-01-29 NOTE — Progress Notes (Signed)
 Pharmacy Antibiotic Note  Miguel Marsh is a 63 y.o. male admitted on 01/27/2024 with  CAP with bronchiectasis .  Pharmacy has been consulted for Levaquin dosing.  Plan: Levaquin 750 mg IV every 24 hours. Monitor labs, c/s, and patient improvement.  Height: 5\' 7"  (170.2 cm) Weight: 77.3 kg (170 lb 6.4 oz) IBW/kg (Calculated) : 66.1  Temp (24hrs), Avg:98.1 F (36.7 C), Min:98 F (36.7 C), Max:98.2 F (36.8 C)  Recent Labs  Lab 01/27/24 2040 01/28/24 0436  WBC 6.9 5.5  CREATININE 0.61 0.47*    Estimated Creatinine Clearance: 89.5 mL/min (A) (by C-G formula based on SCr of 0.47 mg/dL (L)).    Allergies  Allergen Reactions   Ibuprofen Shortness Of Breath    Antimicrobials this admission: Levaquin 4/1 >> Ctx 3/31 >> 4/1  Microbiology results: 3/31 Sputum: gram + cocci in pairs, yeast, gram neg rods    Thank you for allowing pharmacy to be a part of this patient's care.  Judeth Cornfield, PharmD Clinical Pharmacist 01/29/2024 8:55 AM

## 2024-01-30 DIAGNOSIS — J441 Chronic obstructive pulmonary disease with (acute) exacerbation: Secondary | ICD-10-CM | POA: Diagnosis not present

## 2024-01-30 LAB — CULTURE, RESPIRATORY W GRAM STAIN

## 2024-01-30 MED ORDER — NICOTINE 14 MG/24HR TD PT24
14.0000 mg | MEDICATED_PATCH | Freq: Every day | TRANSDERMAL | Status: DC
  Administered 2024-01-30 – 2024-01-31 (×2): 14 mg via TRANSDERMAL
  Filled 2024-01-30 (×2): qty 1

## 2024-01-30 MED ORDER — IPRATROPIUM-ALBUTEROL 0.5-2.5 (3) MG/3ML IN SOLN
3.0000 mL | Freq: Three times a day (TID) | RESPIRATORY_TRACT | Status: DC
  Administered 2024-01-30 – 2024-01-31 (×3): 3 mL via RESPIRATORY_TRACT
  Filled 2024-01-30 (×3): qty 3

## 2024-01-30 NOTE — Plan of Care (Signed)
  Problem: Education: Goal: Knowledge of General Education information will improve Description: Including pain rating scale, medication(s)/side effects and non-pharmacologic comfort measures Outcome: Progressing   Problem: Clinical Measurements: Goal: Respiratory complications will improve Outcome: Progressing   Problem: Activity: Goal: Risk for activity intolerance will decrease Outcome: Progressing   Problem: Nutrition: Goal: Adequate nutrition will be maintained Outcome: Progressing   Problem: Pain Managment: Goal: General experience of comfort will improve and/or be controlled Outcome: Progressing

## 2024-01-30 NOTE — Progress Notes (Signed)
 [facility] PROGRESS NOTE  Miguel Marsh (DOB: 1961-02-23) JWJ:[id number] PCP: [name], NP  Brief Narrative: Miguel Marsh is a 63 y.o. male with a history of 2L O2-dependent COPD, HTN, metastatic NSCLC, cocaine use who presented to the ED on 01/27/2024 with worsening shortness of breath and wheezing. He was tachycardic and tachypneic, afebrile, and hypoxemic with bibasilar opacities (infiltrate vs. atelectasis) on CXR. Viral panel was negative. WBC, BNP and troponin were all within normal limits. He was given IV steroid, nebulized therapies and readmitted for AECOPD (had been admitted 3/17-3/20 for same).   Subjective: -Cough and dyspnea and wheezing persist -Desaturated with attempts to ambulate  Objective: BP 132/76   Pulse 85   Temp 98.3 F (36.8 C) (Oral)   Resp 18   Ht 5\' 7"  (1.702 m)   Wt 77.3 kg   SpO2 96%   BMI 26.69 kg/m   \  Physical Exam  Gen:- Awake Alert, some conversational dyspnea HEENT:- [place].AT, No sclera icterus Nose- [place] 3L/min Neck-Supple Neck,No JVD,.  Lungs-improving air movement, scattered wheezes  CV- S1, S2 normal, RRR Abd-  +ve B.Sounds, Abd Soft, No tenderness,    Extremity/Skin:- No  edema,   good pedal pulses  Psych-affect is appropriate, oriented x3 Neuro-no new focal deficits, no tremors   Assessment & Plan: Chronic hypoxic respiratory failure with acute respiratory distress due to AECOPD:  - Continue supplemental oxygen to maintain adequate saturations and normal respiratory effort.  - Sputum culture pending, GPC and GNR and yeast noted thus far. Given the CT evidence of bronchiectasis and very rapid readmission, will change to respiratory quinolone. - Mucinex DM 01/30/24 -Continue IV Solu-Medrol, bronchodilators, mucolytics and Levaquin -Desaturates easily   History of cocaine use:  - UDS +, cessation counseling provided  HTN:  - Avoid beta blocker  Anxiety:  - Continue clonazepam at dose prescribed consistently  per PDMP review  Chronic pain:  - Continue oxycodone as confirmed by PDMP review this morning.   GERD:  - Continue PPI especially while on steroids  Shon Hale, MD [facility] [email or web address] 01/30/2024, 7:05 PM

## 2024-01-30 NOTE — Plan of Care (Signed)

## 2024-01-31 DIAGNOSIS — J441 Chronic obstructive pulmonary disease with (acute) exacerbation: Secondary | ICD-10-CM | POA: Diagnosis not present

## 2024-01-31 DIAGNOSIS — F17200 Nicotine dependence, unspecified, uncomplicated: Secondary | ICD-10-CM

## 2024-01-31 DIAGNOSIS — Z720 Tobacco use: Secondary | ICD-10-CM

## 2024-01-31 MED ORDER — GUAIFENESIN ER 600 MG PO TB12
600.0000 mg | ORAL_TABLET | Freq: Two times a day (BID) | ORAL | 2 refills | Status: DC
Start: 2024-01-31 — End: 2024-07-11

## 2024-01-31 MED ORDER — BREZTRI AEROSPHERE 160-9-4.8 MCG/ACT IN AERO: 2.0000 | INHALATION_SPRAY | Freq: Two times a day (BID) | RESPIRATORY_TRACT | 3 refills | Status: DC

## 2024-01-31 MED ORDER — ALBUTEROL SULFATE (2.5 MG/3ML) 0.083% IN NEBU: 2.5000 mg | INHALATION_SOLUTION | Freq: Four times a day (QID) | RESPIRATORY_TRACT | 2 refills | Status: DC | PRN

## 2024-01-31 MED ORDER — CEFDINIR 300 MG PO CAPS: 300.0000 mg | ORAL_CAPSULE | Freq: Two times a day (BID) | ORAL | 0 refills | Status: AC

## 2024-01-31 MED ORDER — ACETAMINOPHEN 325 MG PO TABS: 650.0000 mg | ORAL_TABLET | Freq: Four times a day (QID) | ORAL | Status: DC | PRN

## 2024-01-31 MED ORDER — OMEPRAZOLE 40 MG PO CPDR: 40.0000 mg | DELAYED_RELEASE_CAPSULE | Freq: Every day | ORAL | 5 refills | Status: DC

## 2024-01-31 MED ORDER — NICOTINE 21 MG/24HR TD PT24: 21.0000 mg | MEDICATED_PATCH | TRANSDERMAL | 0 refills | Status: AC

## 2024-01-31 MED ORDER — TRAZODONE HCL 50 MG PO TABS: 50.0000 mg | ORAL_TABLET | Freq: Every evening | ORAL | 3 refills | Status: DC | PRN

## 2024-01-31 MED ORDER — NICOTINE 21 MG/24HR TD PT24: 21.0000 mg | MEDICATED_PATCH | TRANSDERMAL | 0 refills | Status: DC

## 2024-01-31 MED ORDER — DOXYCYCLINE HYCLATE 100 MG PO TABS: 100.0000 mg | ORAL_TABLET | Freq: Two times a day (BID) | ORAL | 0 refills | Status: AC

## 2024-01-31 MED ORDER — PREDNISONE 20 MG PO TABS: 40.0000 mg | ORAL_TABLET | Freq: Every day | ORAL | 0 refills | Status: AC

## 2024-01-31 MED ORDER — ALBUTEROL SULFATE HFA 108 (90 BASE) MCG/ACT IN AERS: 2.0000 | INHALATION_SPRAY | Freq: Four times a day (QID) | RESPIRATORY_TRACT | 3 refills | Status: DC | PRN

## 2024-01-31 NOTE — Plan of Care (Signed)
  Problem: Education: Goal: Knowledge of General Education information will improve Description: Including pain rating scale, medication(s)/side effects and non-pharmacologic comfort measures Outcome: Progressing   Problem: Health Behavior/Discharge Planning: Goal: Ability to manage health-related needs will improve Outcome: Progressing   Problem: Clinical Measurements: Goal: Ability to maintain clinical measurements within normal limits will improve Outcome: Progressing   Problem: Nutrition: Goal: Adequate nutrition will be maintained Outcome: Progressing   Problem: Coping: Goal: Level of anxiety will decrease Outcome: Progressing   Problem: Pain Managment: Goal: General experience of comfort will improve and/or be controlled Outcome: Progressing

## 2024-01-31 NOTE — Care Management Important Message (Signed)
 Important Message  Patient Details  Name: Miguel Marsh MRN: [id number] Date of Birth: 1961-06-08   Important Message Given:  N/A - LOS <3 / Initial given by admissions     [name] 01/31/2024, 2:38 PM
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
## 2024-01-31 NOTE — Discharge Summary (Addendum)
 Miguel Marsh, is a 63 y.o. male  DOB [birth year]  MRN [id number].  Admission date:  01/27/2024  Admitting Physician  Hannah Beat, MD  Discharge Date:  01/31/2024   Primary MD  [name], NP  Recommendations for primary care physician for things to follow:  1)You need oxygen at home at 2 L via nasal cannula continuously while awake and while asleep--- smoking or having open fires around oxygen can cause fire, significant injury and death  2)Complete Abstinence from Tobacco and Cocaine Advised  3) please note that there are some other medication changes  Admission Diagnosis  COPD exacerbation (HCC) [J44.1]   Discharge Diagnosis  COPD exacerbation (HCC) [J44.1]    Principal Problem:   COPD exacerbation (HCC) Active Problems:   Essential hypertension   Anxiety   GERD without esophagitis      Past Medical History:  Diagnosis Date   Arthritis    Bronchitis    Cancer (HCC)    metastatic NSCLC (followed by [facility])   COPD (chronic obstructive pulmonary disease) (HCC)    HTN (hypertension)     Past Surgical History:  Procedure Laterality Date   LUNG BIOPSY     TOTAL HIP ARTHROPLASTY     TUMOR REMOVAL Right 12/04/2017     HPI  from the history and physical done on the day of admission:   Miguel Marsh is a 63 y.o. African-American male with medical history significant for COPD, chronic respiratory failure on home O2 at 2 L/min, essential hypertension and metastatic non-small cell lung cancer, who presented to the emergency room with acute onset of worsening cough productive of whitish sputum as well as dyspnea and wheezing since yesterday.  He denies any fever or chills.  No nausea or vomiting or diarrhea or abdominal pain.  He admits to chest pain only with cough without palpitations.  No dysuria, oliguria or hematuria or flank pain.   ED Course: When the patient came to the ER, vital  signs revealed heart rate of 107 with respiratory rate of 22 and blood pressure 144/75.  Pulse oximetry was 80% on room air and 94% on 2.5 L of O2 by nasal cannula. EKG as reviewed by me : EKG showed sinus tachycardia with rate of 101 with left anterior fascicular block. Imaging: Portable chest x-ray showed bibasilar atelectasis or infiltrates.   The patient was given 2 g of IV magnesium sulfate, 125 mg of IV Solu-Medrol, DuoNeb and 2.5 mg of nebulized albuterol.  He will be admitted to a medical telemetry observation bed for further evaluation and management.   Hospital Course:     Assessment and Plan: 1)Acute on Chronic  hypoxic respiratory failure    - -Sputum Gram stain with GPC and GNR  --CT evidence of bronchiectasis  --Sputum culture negative -Treated with IV Solu-Medrol, bronchodilators, mucolytics and Levaquin -Clinically much improved --Oxygen requirement is back to his baseline of 2-3 L via nasal cannula -Okay to discharge on Doxy , prednisone and Omnicef    2)History  of cocaine use:  - UDS +, cessation counseling provided -Is not interested in drug rehab    3)Anxiety:  -Continue PTA clonazepam -May use trazodone as needed   4)Chronic pain:  - Continue oxycodone as confirmed by PDMP  -Follow-up with PCP for refills   5)GERD:  - Continue omeprazole especially while on steroids  6) tobacco abuse-- Smoking cessation counseling for 4 minutes today, consider nicotine patch I have discussed tobacco cessation with the patient.  I have counseled the patient regarding the negative impacts of continued tobacco use including but not limited to lung cancer, COPD, and cardiovascular disease.  I have discussed alternatives to tobacco and modalities that may help facilitate tobacco cessation including but not limited to biofeedback, hypnosis, and medications.  Total time spent with tobacco counseling was 4 minutes.   Discharge Condition: stable  Follow UP   Follow-up  Information     Miguel Cowden, MD. Schedule an appointment as soon as possible for a visit in 3 week(s).   Specialty: Pulmonary Disease Contact information: [address] [place] [address] [phone]                 Consults obtained -   Diet and Activity recommendation:  As advised  Discharge Instructions    Discharge Instructions     Call MD for:  difficulty breathing, headache or visual disturbances   Complete by: As directed    Call MD for:  persistant dizziness or light-headedness   Complete by: As directed    Call MD for:  persistant nausea and vomiting   Complete by: As directed    Call MD for:  temperature >100.4   Complete by: As directed    Diet - low sodium heart healthy   Complete by: As directed    Discharge instructions   Complete by: As directed    1)You need oxygen at home at 2 L via nasal cannula continuously while awake and while asleep--- smoking or having open fires around oxygen can cause fire, significant injury and death  2)Complete Abstinence from Tobacco and Cocaine Advised  3) please note that there are some other medication changes   Increase activity slowly   Complete by: As directed        Discharge Medications     Allergies as of 01/31/2024       Reactions   Ibuprofen Shortness Of Breath        Medication List     STOP taking these medications    azithromycin 500 MG tablet Commonly known as: ZITHROMAX       TAKE these medications    acetaminophen 325 MG tablet Commonly known as: TYLENOL Take 2 tablets (650 mg total) by mouth every 6 (six) hours as needed for mild pain (pain score 1-3) (or Fever >/= 101).   albuterol 108 (90 Base) MCG/ACT inhaler Commonly known as: VENTOLIN HFA Inhale 2 puffs into the lungs every 6 (six) hours as needed for shortness of breath or wheezing (Cough). What changed: reasons to take this   albuterol (2.5 MG/3ML) 0.083% nebulizer solution Commonly known as: PROVENTIL Inhale  3 mLs (2.5 mg total) into the lungs every 6 (six) hours as needed for wheezing or shortness of breath. What changed: Another medication with the same name was changed. Make sure you understand how and when to take each.   Breztri Aerosphere 160-9-4.8 MCG/ACT Aero Generic drug: budeson-glycopyrrolate-formoterol Inhale 2 puffs into the lungs 2 (two) times daily.   cefdinir  300 MG capsule Commonly known as: OMNICEF Take 1 capsule (300 mg total) by mouth 2 (two) times daily for 5 days.   clonazePAM 1 MG tablet Commonly known as: KLONOPIN Take 1 tablet (1 mg total) by mouth 2 (two) times daily as needed for anxiety.   doxycycline 100 MG tablet Commonly known as: VIBRA-TABS Take 1 tablet (100 mg total) by mouth 2 (two) times daily for 5 days.   furosemide 40 MG tablet Commonly known as: LASIX Take 0.5 tablets (20 mg total) by mouth See admin instructions. Take once a day  as needed for fluid/swelling   guaiFENesin 600 MG 12 hr tablet Commonly known as: Mucinex Take 1 tablet (600 mg total) by mouth 2 (two) times daily.   nicotine 21 mg/24hr patch Commonly known as: NICODERM CQ - dosed in mg/24 hours Place 1 patch (21 mg total) onto the skin daily for 28 days.   omeprazole 40 MG capsule Commonly known as: PRILOSEC Take 1 capsule (40 mg total) by mouth daily.   oxyCODONE 15 MG immediate release tablet Commonly known as: ROXICODONE Take 15 mg by mouth every 4 (four) hours as needed for pain.   OXYGEN Inhale 3 L into the lungs continuous.   predniSONE 20 MG tablet Commonly known as: DELTASONE Take 2 tablets (40 mg total) by mouth daily with breakfast for 5 days. What changed:  medication strength See the new instructions.   traZODone 50 MG tablet Commonly known as: DESYREL Take 1 tablet (50 mg total) by mouth at bedtime as needed for sleep.        Major procedures and Radiology Reports - PLEASE review detailed and final reports for all details, in brief -   DG Chest  Port 1 View Result Date: 01/27/2024 CLINICAL DATA:  Chest pain and shortness of breath all day EXAM: PORTABLE CHEST 1 VIEW COMPARISON:  01/14/2024 FINDINGS: Stable cardiomediastinal silhouette. Bibasilar atelectasis or infiltrates. No pleural effusion or pneumothorax. No displaced rib fractures. IMPRESSION: Bibasilar atelectasis or infiltrates. Electronically Signed   By: Minerva Fester M.D.   On: 01/27/2024 20:55   DG Chest Port 1 View Result Date: 01/14/2024 CLINICAL DATA:  Cough and shortness of breath. EXAM: PORTABLE CHEST 1 VIEW COMPARISON:  December 04, 2023 FINDINGS: The heart size and mediastinal contours are within normal limits. Surgical clips are seen overlying the superior mediastinum on the left. Mild atelectasis and/or infiltrate is seen within the mid right lung and bilateral lung bases. No pleural effusion or pneumothorax is identified. The visualized skeletal structures are unremarkable. IMPRESSION: Mild mid right lung and bibasilar atelectasis and/or infiltrate. Electronically Signed   By: Aram Candela M.D.   On: 01/14/2024 22:15   Micro Results   Recent Results (from the past 240 hours)  Resp panel by RT-PCR (RSV, Flu A&B, Covid) Anterior Nasal Swab     Status: None   Collection Time: 01/27/24 10:25 PM   Specimen: Anterior Nasal Swab  Result Value Ref Range Status   SARS Coronavirus 2 by RT PCR NEGATIVE NEGATIVE Final    Comment: (NOTE) SARS-CoV-2 target nucleic acids are NOT DETECTED.  The SARS-CoV-2 RNA is generally detectable in upper respiratory specimens during the acute phase of infection. The lowest concentration of SARS-CoV-2 viral copies this assay can detect is 138 copies/mL. A negative result does not preclude SARS-Cov-2 infection and should not be used as the sole basis for treatment or other patient management decisions. A negative result may occur with  improper specimen collection/handling, submission of  specimen other than nasopharyngeal swab, presence  of viral mutation(s) within the areas targeted by this assay, and inadequate number of viral copies(<138 copies/mL). A negative result must be combined with clinical observations, patient history, and epidemiological information. The expected result is Negative.  Fact Sheet for Patients:  BloggerCourse.com  Fact Sheet for Healthcare Providers:  SeriousBroker.it  This test is no t yet approved or cleared by the [place] FDA and  has been authorized for detection and/or diagnosis of SARS-CoV-2 by FDA under an Emergency Use Authorization (EUA). This EUA will remain  in effect (meaning this test can be used) for the duration of the COVID-19 declaration under Section 564(b)(1) of the Act, 21 U.S.C.section 360bbb-3(b)(1), unless the authorization is terminated  or revoked sooner.       Influenza A by PCR NEGATIVE NEGATIVE Final   Influenza B by PCR NEGATIVE NEGATIVE Final    Comment: (NOTE) The Xpert Xpress SARS-CoV-2/FLU/RSV plus assay is intended as an aid in the diagnosis of influenza from Nasopharyngeal swab specimens and should not be used as a sole basis for treatment. Nasal washings and aspirates are unacceptable for Xpert Xpress SARS-CoV-2/FLU/RSV testing.  Fact Sheet for Patients: BloggerCourse.com  Fact Sheet for Healthcare Providers: SeriousBroker.it  This test is not yet approved or cleared by the [place] FDA and has been authorized for detection and/or diagnosis of SARS-CoV-2 by FDA under an Emergency Use Authorization (EUA). This EUA will remain in effect (meaning this test can be used) for the duration of the COVID-19 declaration under Section 564(b)(1) of the Act, 21 U.S.C. section 360bbb-3(b)(1), unless the authorization is terminated or revoked.     Resp Syncytial Virus by PCR NEGATIVE NEGATIVE Final    Comment: (NOTE) Fact Sheet for  Patients: BloggerCourse.com  Fact Sheet for Healthcare Providers: SeriousBroker.it  This test is not yet approved or cleared by the [place] FDA and has been authorized for detection and/or diagnosis of SARS-CoV-2 by FDA under an Emergency Use Authorization (EUA). This EUA will remain in effect (meaning this test can be used) for the duration of the COVID-19 declaration under Section 564(b)(1) of the Act, 21 U.S.C. section 360bbb-3(b)(1), unless the authorization is terminated or revoked.  Performed at [facility], [address] [place], [place] [address]   Expectorated Sputum Assessment w Gram Stain, Rflx to Resp Cult     Status: None   Collection Time: 01/28/24  9:46 AM   Specimen: Sputum  Result Value Ref Range Status   Specimen Description SPUTUM  Final   Special Requests NONE  Final   Sputum evaluation   Final    THIS SPECIMEN IS ACCEPTABLE FOR SPUTUM CULTURE Performed at [facility], [address] [place], [place] [address]    Report Status 01/28/2024 FINAL  Final  Culture, Respiratory w Gram Stain     Status: None   Collection Time: 01/28/24  9:46 AM   Specimen: SPU  Result Value Ref Range Status   Specimen Description   Final    SPUTUM Performed at [facility], [address], [place], [place] [address]    Special Requests   Final    NONE Reflexed from [id number] Performed at [facility], [address] [place], [place] [address]    Gram Stain   Final    ABUNDANT WBC PRESENT, PREDOMINANTLY PMN FEW GRAM POSITIVE COCCI IN PAIRS IN CHAINS RARE YEAST WITH PSEUDOHYPHAE RARE GRAM NEGATIVE RODS    Culture   Final    MODERATE Normal respiratory  flora-no Staph aureus or Pseudomonas seen Performed at [facility], [address], [place], [place] [address]    Report Status 01/30/2024 FINAL  Final    Today   Subjective    Miguel Marsh today has no new complaints =-Cough and wheezing has  improved -Ambulated on 2 L of oxygen O2 sats 92 to 95%      -No fever  Or chills   No Nausea, Vomiting or Diarrhea   Patient has been seen and examined prior to discharge   Objective   Blood pressure 138/71, pulse 91, temperature 97.9 F (36.6 C), temperature source Oral, resp. rate 18, height 5\' 7"  (1.702 m), weight 77.3 kg, SpO2 97%.   Intake/Output Summary (Last 24 hours) at 01/31/2024 1522 Last data filed at 01/30/2024 1946 Gross per 24 hour  Intake 150 ml  Output --  Net 150 ml    Exam Gen:- Awake Alert, no acute distress, no conversational dyspnea HEENT:- [place].AT, No sclera icterus Nose-[place] 2L/min Neck-Supple Neck,No JVD,.  Lungs-improved air movement, no wheezing  CV- S1, S2 normal, regular Abd-  +ve B.Sounds, Abd Soft, No tenderness,    Extremity/Skin:- No  edema,   good pulses Psych-affect is appropriate, oriented x3 Neuro-no new focal deficits, no tremors    Data Review   CBC w Diff:  Lab Results  Component Value Date   WBC 5.5 01/28/2024   HGB 12.1 (L) 01/28/2024   HCT 39.2 01/28/2024   PLT 179 01/28/2024   LYMPHOPCT 12 01/27/2024   MONOPCT 12 01/27/2024   EOSPCT 2 01/27/2024   BASOPCT 0 01/27/2024    CMP:  Lab Results  Component Value Date   NA 140 01/28/2024   K 4.8 01/28/2024   CL 96 (L) 01/28/2024   CO2 36 (H) 01/28/2024   BUN 11 01/28/2024   CREATININE 0.47 (L) 01/28/2024   PROT 6.4 (L) 01/27/2024   ALBUMIN 3.3 (L) 01/27/2024   BILITOT 0.8 01/27/2024   ALKPHOS 56 01/27/2024   AST 15 01/27/2024   ALT 12 01/27/2024  .  Total Discharge time is about 33 minutes  Shon Hale M.D on 01/31/2024 at 3:22 PM  Go to [email or web address] -  for contact info  [facility] - Office  [phone]

## 2024-03-04 ENCOUNTER — Other Ambulatory Visit: Payer: Self-pay

## 2024-03-04 ENCOUNTER — Encounter (HOSPITAL_COMMUNITY): Payer: Self-pay

## 2024-03-04 ENCOUNTER — Inpatient Hospital Stay (HOSPITAL_COMMUNITY)
Admission: EM | Admit: 2024-03-04 | Discharge: 2024-03-06 | DRG: 189 | Disposition: A | Discharge status: Home / Self Care | Attending: Internal Medicine | Admitting: Internal Medicine

## 2024-03-04 ENCOUNTER — Emergency Department (HOSPITAL_COMMUNITY)

## 2024-03-04 DIAGNOSIS — Z886 Allergy status to analgesic agent status: Secondary | ICD-10-CM

## 2024-03-04 DIAGNOSIS — G8929 Other chronic pain: Secondary | ICD-10-CM | POA: Diagnosis present

## 2024-03-04 DIAGNOSIS — E8729 Other acidosis: Secondary | ICD-10-CM | POA: Diagnosis present

## 2024-03-04 DIAGNOSIS — R609 Edema, unspecified: Secondary | ICD-10-CM | POA: Diagnosis present

## 2024-03-04 DIAGNOSIS — F419 Anxiety disorder, unspecified: Secondary | ICD-10-CM | POA: Diagnosis present

## 2024-03-04 DIAGNOSIS — G4700 Insomnia, unspecified: Secondary | ICD-10-CM | POA: Diagnosis present

## 2024-03-04 DIAGNOSIS — J9621 Acute and chronic respiratory failure with hypoxia: Secondary | ICD-10-CM | POA: Diagnosis present

## 2024-03-04 DIAGNOSIS — Z7951 Long term (current) use of inhaled steroids: Secondary | ICD-10-CM | POA: Diagnosis not present

## 2024-03-04 DIAGNOSIS — C3491 Malignant neoplasm of unspecified part of right bronchus or lung: Secondary | ICD-10-CM | POA: Diagnosis not present

## 2024-03-04 DIAGNOSIS — J479 Bronchiectasis, uncomplicated: Secondary | ICD-10-CM | POA: Diagnosis present

## 2024-03-04 DIAGNOSIS — Z9981 Dependence on supplemental oxygen: Secondary | ICD-10-CM | POA: Diagnosis not present

## 2024-03-04 DIAGNOSIS — Z1152 Encounter for screening for COVID-19: Secondary | ICD-10-CM | POA: Diagnosis not present

## 2024-03-04 DIAGNOSIS — J441 Chronic obstructive pulmonary disease with (acute) exacerbation: Secondary | ICD-10-CM | POA: Diagnosis present

## 2024-03-04 DIAGNOSIS — F17210 Nicotine dependence, cigarettes, uncomplicated: Secondary | ICD-10-CM | POA: Diagnosis present

## 2024-03-04 DIAGNOSIS — Z85118 Personal history of other malignant neoplasm of bronchus and lung: Secondary | ICD-10-CM

## 2024-03-04 DIAGNOSIS — J9620 Acute and chronic respiratory failure, unspecified whether with hypoxia or hypercapnia: Secondary | ICD-10-CM | POA: Insufficient documentation

## 2024-03-04 DIAGNOSIS — J9622 Acute and chronic respiratory failure with hypercapnia: Secondary | ICD-10-CM | POA: Diagnosis not present

## 2024-03-04 DIAGNOSIS — I10 Essential (primary) hypertension: Secondary | ICD-10-CM | POA: Diagnosis present

## 2024-03-04 DIAGNOSIS — Z79899 Other long term (current) drug therapy: Secondary | ICD-10-CM | POA: Diagnosis not present

## 2024-03-04 DIAGNOSIS — K219 Gastro-esophageal reflux disease without esophagitis: Secondary | ICD-10-CM | POA: Diagnosis present

## 2024-03-04 LAB — COMPREHENSIVE METABOLIC PANEL WITH GFR
ALT: 16 U/L (ref 0–44)
AST: 17 U/L (ref 15–41)
Albumin: 3.6 g/dL (ref 3.5–5.0)
Alkaline Phosphatase: 98 U/L (ref 38–126)
Anion gap: 6 (ref 5–15)
BUN: 11 mg/dL (ref 8–23)
CO2: 36 mmol/L — ABNORMAL HIGH (ref 22–32)
Calcium: 9 mg/dL (ref 8.9–10.3)
Chloride: 97 mmol/L — ABNORMAL LOW (ref 98–111)
Creatinine, Ser: 0.76 mg/dL (ref 0.61–1.24)
GFR, Estimated: >60 mL/min (ref >60)
Glucose, Bld: 105 mg/dL — ABNORMAL HIGH (ref 70–99)
Potassium: 4.1 mmol/L (ref 3.5–5.1)
Sodium: 139 mmol/L (ref 135–145)
Total Bilirubin: 0.6 mg/dL (ref 0.0–1.2)
Total Protein: 7 g/dL (ref 6.5–8.1)

## 2024-03-04 LAB — CBC WITH DIFFERENTIAL/PLATELET
Abs Immature Granulocytes: 0.03 10*3/uL (ref 0.00–0.07)
Basophils Absolute: 0.1 10*3/uL (ref 0.0–0.1)
Basophils Relative: 1 %
Eosinophils Absolute: 0.3 10*3/uL (ref 0.0–0.5)
Eosinophils Relative: 5 %
HCT: 40.1 % (ref 39.0–52.0)
Hemoglobin: 12.6 g/dL — ABNORMAL LOW (ref 13.0–17.0)
Immature Granulocytes: 0 %
Lymphocytes Relative: 34 %
Lymphs Abs: 2.3 10*3/uL (ref 0.7–4.0)
MCH: 30.8 pg (ref 26.0–34.0)
MCHC: 31.4 g/dL (ref 30.0–36.0)
MCV: 98 fL (ref 80.0–100.0)
Monocytes Absolute: 1.2 10*3/uL — ABNORMAL HIGH (ref 0.1–1.0)
Monocytes Relative: 17 %
Neutro Abs: 2.9 10*3/uL (ref 1.7–7.7)
Neutrophils Relative %: 43 %
Platelets: 218 10*3/uL (ref 150–400)
RBC: 4.09 MIL/uL — ABNORMAL LOW (ref 4.22–5.81)
RDW: 12.4 % (ref 11.5–15.5)
WBC: 6.8 10*3/uL (ref 4.0–10.5)
nRBC: 0 % (ref 0.0–0.2)

## 2024-03-04 LAB — BLOOD GAS, VENOUS
Acid-Base Excess: 13 mmol/L — ABNORMAL HIGH (ref 0.0–2.0)
Bicarbonate: 42.8 mmol/L — ABNORMAL HIGH (ref 20.0–28.0)
Drawn by: 7012
O2 Saturation: 89.3 %
Patient temperature: 36.7
pCO2, Ven: 82 mmHg (ref 44–60)
pH, Ven: 7.32 (ref 7.25–7.43)
pO2, Ven: 53 mmHg — ABNORMAL HIGH (ref 32–45)

## 2024-03-04 LAB — BRAIN NATRIURETIC PEPTIDE: B Natriuretic Peptide: 21 pg/mL (ref 0.0–100.0)

## 2024-03-04 LAB — MAGNESIUM: Magnesium: 1.9 mg/dL (ref 1.7–2.4)

## 2024-03-04 LAB — RESP PANEL BY RT-PCR (RSV, FLU A&B, COVID)  RVPGX2
Influenza A by PCR: NEGATIVE
Influenza B by PCR: NEGATIVE
Resp Syncytial Virus by PCR: NEGATIVE
SARS Coronavirus 2 by RT PCR: NEGATIVE

## 2024-03-04 LAB — MRSA NEXT GEN BY PCR, NASAL: MRSA by PCR Next Gen: NOT DETECTED

## 2024-03-04 MED ORDER — ENOXAPARIN SODIUM 40 MG/0.4ML IJ SOSY
40.0000 mg | PREFILLED_SYRINGE | INTRAMUSCULAR | Status: DC
  Administered 2024-03-05 – 2024-03-06 (×2): 40 mg via SUBCUTANEOUS
  Filled 2024-03-04 (×2): qty 0.4

## 2024-03-04 MED ORDER — CLONAZEPAM 0.5 MG PO TABS
1.0000 mg | ORAL_TABLET | Freq: Two times a day (BID) | ORAL | Status: DC | PRN
  Administered 2024-03-04 – 2024-03-06 (×3): 1 mg via ORAL
  Filled 2024-03-04 (×3): qty 2

## 2024-03-04 MED ORDER — TRAZODONE HCL 50 MG PO TABS: 50.0000 mg | ORAL_TABLET | Freq: Every evening | ORAL | Status: DC | PRN

## 2024-03-04 MED ORDER — OXYCODONE HCL 5 MG PO TABS
15.0000 mg | ORAL_TABLET | ORAL | Status: DC | PRN
  Administered 2024-03-04 – 2024-03-06 (×4): 15 mg via ORAL
  Filled 2024-03-04 (×4): qty 3

## 2024-03-04 MED ORDER — METHYLPREDNISOLONE SODIUM SUCC 125 MG IJ SOLR
125.0000 mg | Freq: Once | INTRAMUSCULAR | Status: AC
  Administered 2024-03-04: 125 mg via INTRAVENOUS
  Filled 2024-03-04: qty 2

## 2024-03-04 MED ORDER — NICOTINE 21 MG/24HR TD PT24
21.0000 mg | MEDICATED_PATCH | Freq: Every day | TRANSDERMAL | Status: DC
  Filled 2024-03-04 (×2): qty 1

## 2024-03-04 MED ORDER — PANTOPRAZOLE SODIUM 40 MG PO TBEC
40.0000 mg | DELAYED_RELEASE_TABLET | Freq: Every day | ORAL | Status: DC
  Administered 2024-03-05 – 2024-03-06 (×2): 40 mg via ORAL
  Filled 2024-03-04 (×2): qty 1

## 2024-03-04 MED ORDER — PREDNISONE 20 MG PO TABS
40.0000 mg | ORAL_TABLET | Freq: Every day | ORAL | Status: DC
  Administered 2024-03-05 – 2024-03-06 (×2): 40 mg via ORAL
  Filled 2024-03-04 (×2): qty 2

## 2024-03-04 MED ORDER — HYDROCHLOROTHIAZIDE 25 MG PO TABS
25.0000 mg | ORAL_TABLET | Freq: Every morning | ORAL | Status: DC
  Administered 2024-03-05 – 2024-03-06 (×2): 25 mg via ORAL
  Filled 2024-03-04 (×2): qty 1

## 2024-03-04 MED ORDER — BUDESON-GLYCOPYRROL-FORMOTEROL 160-9-4.8 MCG/ACT IN AERO
2.0000 | INHALATION_SPRAY | Freq: Two times a day (BID) | RESPIRATORY_TRACT | Status: DC
  Administered 2024-03-05 – 2024-03-06 (×3): 2 via RESPIRATORY_TRACT
  Filled 2024-03-04: qty 5.9

## 2024-03-04 MED ORDER — AZITHROMYCIN 250 MG PO TABS
500.0000 mg | ORAL_TABLET | Freq: Every day | ORAL | Status: DC
  Administered 2024-03-05: 500 mg via ORAL
  Filled 2024-03-04: qty 2

## 2024-03-04 MED ORDER — ONDANSETRON HCL 4 MG/2ML IJ SOLN: 4.0000 mg | Freq: Four times a day (QID) | INTRAMUSCULAR | Status: DC | PRN

## 2024-03-04 MED ORDER — SODIUM CHLORIDE 0.9 % IV SOLN
500.0000 mg | INTRAVENOUS | Status: AC
  Administered 2024-03-04: 500 mg via INTRAVENOUS
  Filled 2024-03-04: qty 5

## 2024-03-04 MED ORDER — ONDANSETRON HCL 4 MG PO TABS: 4.0000 mg | ORAL_TABLET | Freq: Four times a day (QID) | ORAL | Status: DC | PRN

## 2024-03-04 MED ORDER — CHLORHEXIDINE GLUCONATE CLOTH 2 % EX PADS
6.0000 | MEDICATED_PAD | Freq: Every day | CUTANEOUS | Status: DC
  Administered 2024-03-04 – 2024-03-05 (×2): 6 via TOPICAL

## 2024-03-04 MED ORDER — IPRATROPIUM-ALBUTEROL 0.5-2.5 (3) MG/3ML IN SOLN
3.0000 mL | Freq: Once | RESPIRATORY_TRACT | Status: AC
  Administered 2024-03-04: 3 mL via RESPIRATORY_TRACT
  Filled 2024-03-04: qty 3

## 2024-03-04 MED ORDER — FUROSEMIDE 20 MG PO TABS: 20.0000 mg | ORAL_TABLET | Freq: Every day | ORAL | Status: DC | PRN

## 2024-03-04 MED ORDER — IPRATROPIUM-ALBUTEROL 0.5-2.5 (3) MG/3ML IN SOLN
3.0000 mL | Freq: Four times a day (QID) | RESPIRATORY_TRACT | Status: DC
  Administered 2024-03-05 – 2024-03-06 (×7): 3 mL via RESPIRATORY_TRACT
  Filled 2024-03-04 (×7): qty 3

## 2024-03-04 MED ORDER — ALBUTEROL SULFATE (2.5 MG/3ML) 0.083% IN NEBU
10.0000 mg/h | INHALATION_SOLUTION | RESPIRATORY_TRACT | Status: DC
  Administered 2024-03-04: 10 mg/h via RESPIRATORY_TRACT
  Filled 2024-03-04: qty 12

## 2024-03-04 NOTE — ED Notes (Signed)
 Attempted to call report x2

## 2024-03-04 NOTE — H&P (Addendum)
 History and Physical    EDLEY OH GNF:[id number] DOB: 10-15-1961 DOA: 03/04/2024  PCP: Lenn Quint, NP Patient coming from: home  Chief Complaint: SOB  HPI: Miguel Marsh is a 63 y.o. male with medical history significant of metastatic MSCLC, COPD, HTN, GER, Chronic pain. Presenting w/ several day h/o progressive SOB. Albuterol  w/o beneift. EMS called after sx progressed to the point of causing chest discomfort. EMS noted pt was hy0poxic on baseline 3L [place] w/ increased WOB>     ED Course: duoneb, methylpred given in ED and placed on BIPAP.   Review of Systems: As per HPI otherwise all other systems reviewed and are negative  Ambulatory Status:no restrictions.   Past Medical History:  Diagnosis Date   Arthritis    Bronchitis    Cancer (HCC)    metastatic NSCLC (followed by [facility])   COPD (chronic obstructive pulmonary disease) (HCC)    HTN (hypertension)     Past Surgical History:  Procedure Laterality Date   LUNG BIOPSY     TOTAL HIP ARTHROPLASTY     TUMOR REMOVAL Right 12/04/2017    Social History   Socioeconomic History   Marital status: Single    Spouse name: Not on file   Number of children: Not on file   Years of education: Not on file   Highest education level: Not on file  Occupational History   Not on file  Tobacco Use   Smoking status: Former    Current packs/day: 0.10    Types: Cigarettes   Smokeless tobacco: Never  Vaping Use   Vaping status: Never Used  Substance and Sexual Activity   Alcohol  use: Yes    Comment: occ   Drug use: Yes    Types: Marijuana, Cocaine    Comment: occ   Sexual activity: Not Currently  Other Topics Concern   Not on file  Social History Narrative   Not on file   Social Drivers of Health   Financial Resource Strain: Not on file  Food Insecurity: No Food Insecurity (03/04/2024)   Hunger Vital Sign    Worried About Running Out of Food in the Last Year: Never true    Ran Out of Food in the Last Year: Never  true  Transportation Needs: No Transportation Needs (03/04/2024)   PRAPARE - Administrator, Civil Service (Medical): No    Lack of Transportation (Non-Medical): No  Physical Activity: Not on file  Stress: Not on file  Social Connections: Not on file  Intimate Partner Violence: Not At Risk (03/04/2024)   Humiliation, Afraid, Rape, and Kick questionnaire    Fear of Current or Ex-Partner: No    Emotionally Abused: No    Physically Abused: No    Sexually Abused: No    Allergies  Allergen Reactions   Ibuprofen  Shortness Of Breath    History reviewed. No pertinent family history.    Prior to Admission medications   Medication Sig Start Date End Date Taking? Authorizing Provider  acetaminophen  (TYLENOL ) 325 MG tablet Take 2 tablets (650 mg total) by mouth every 6 (six) hours as needed for mild pain (pain score 1-3) (or Fever >/= 101). 01/31/24   Colin Dawley, MD  albuterol  (PROVENTIL ) (2.5 MG/3ML) 0.083% nebulizer solution Inhale 3 mLs (2.5 mg total) into the lungs every 6 (six) hours as needed for wheezing or shortness of breath. 01/31/24   Colin Dawley, MD  albuterol  (VENTOLIN  HFA) 108 (90 Base) MCG/ACT inhaler Inhale 2 puffs into the  lungs every 6 (six) hours as needed for shortness of breath or wheezing (Cough). 01/31/24   Colin Dawley, MD  BREZTRI  AEROSPHERE 160-9-4.8 MCG/ACT AERO Inhale 2 puffs into the lungs 2 (two) times daily. 01/31/24   Colin Dawley, MD  clonazePAM  (KLONOPIN ) 1 MG tablet Take 1 tablet (1 mg total) by mouth 2 (two) times daily as needed for anxiety. 11/01/23   Colin Dawley, MD  furosemide  (LASIX ) 40 MG tablet Take 0.5 tablets (20 mg total) by mouth See admin instructions. Take once a day  as needed for fluid/swelling 11/01/23   Colin Dawley, MD  guaiFENesin  (MUCINEX ) 600 MG 12 hr tablet Take 1 tablet (600 mg total) by mouth 2 (two) times daily. 01/31/24 01/30/25  Colin Dawley, MD  hydrochlorothiazide  (HYDRODIURIL ) 25 MG tablet Take 25 mg by mouth  every morning.    [provider]  nicotine  (NICODERM CQ  - DOSED IN MG/24 HOURS) 21 mg/24hr patch Place 21 mg onto the skin daily. 02/06/24   [provider]  omeprazole  (PRILOSEC) 40 MG capsule Take 1 capsule (40 mg total) by mouth daily. 01/31/24   Colin Dawley, MD  oxyCODONE  (ROXICODONE ) 15 MG immediate release tablet Take 15 mg by mouth every 4 (four) hours as needed for pain.    [provider]  OXYGEN  Inhale 3 L into the lungs continuous.     [provider]  traZODone  (DESYREL ) 50 MG tablet Take 1 tablet (50 mg total) by mouth at bedtime as needed for sleep. 01/31/24   Colin Dawley, MD    Physical Exam: Vitals:   03/04/24 2000 03/04/24 2020 03/04/24 2043 03/04/24 2100  BP: (!) 115/59  (!) 146/79 122/63  Pulse: 78 75 75 75  Resp: 18 18 18 20   Temp:   97.7 F (36.5 C)   TempSrc:   Oral   SpO2: 96% 93% 98% 98%  Weight:   84.7 kg   Height:   5\' 7"  (1.702 m)      General: Appears stated age. Resting in bed.  Eyes:  PERRL, EOMI, normal lids, iris ENT:  grossly normal hearing, lips & tongue, mmm Neck:  no LAD, masses or thyromegaly Cardiovascular:  RRR, no m/r/g. No LE edema.  Respiratory: on BIPAP, wheezing and ronchi throughout.  Abdomen:  soft, ntnd, NABS Skin:  no rash or induration seen on limited exam Musculoskeletal:  grossly normal tone BUE/BLE, good ROM, no bony abnormality Psychiatric:  grossly normal mood and affect, speech fluent and appropriate, AOx3 Neurologic:  CN 2-12 grossly intact, moves all extremities in coordinated fashion, sensation intact  Labs on Admission: I have personally reviewed following labs and imaging studies  CBC: Recent Labs  Lab 03/04/24 1702  WBC 6.8  NEUTROABS 2.9  HGB 12.6*  HCT 40.1  MCV 98.0  PLT 218   Basic Metabolic Panel: Recent Labs  Lab 03/04/24 1702  NA 139  K 4.1  CL 97*  CO2 36*  GLUCOSE 105*  BUN 11  CREATININE 0.76  CALCIUM 9.0  MG 1.9   GFR: Estimated Creatinine  Clearance: 99.5 mL/min (by C-G formula based on SCr of 0.76 mg/dL). Liver Function Tests: Recent Labs  Lab 03/04/24 1702  AST 17  ALT 16  ALKPHOS 98  BILITOT 0.6  PROT 7.0  ALBUMIN 3.6   No results for input(s): "LIPASE", "AMYLASE" in the last 168 hours. No results for input(s): "AMMONIA" in the last 168 hours. Coagulation Profile: No results for input(s): "INR", "PROTIME" in the last 168 hours. Cardiac Enzymes:  No results for input(s): "CKTOTAL", "CKMB", "CKMBINDEX", "TROPONINI" in the last 168 hours. BNP (last 3 results) No results for input(s): "PROBNP" in the last 8760 hours. HbA1C: No results for input(s): "HGBA1C" in the last 72 hours. CBG: No results for input(s): "GLUCAP" in the last 168 hours. Lipid Profile: No results for input(s): "CHOL", "HDL", "LDLCALC", "TRIG", "CHOLHDL", "LDLDIRECT" in the last 72 hours. Thyroid Function Tests: No results for input(s): "TSH", "T4TOTAL", "FREET4", "T3FREE", "THYROIDAB" in the last 72 hours. Anemia Panel: No results for input(s): "VITAMINB12", "FOLATE", "FERRITIN", "TIBC", "IRON", "RETICCTPCT" in the last 72 hours. Urine analysis:    Component Value Date/Time   COLORURINE YELLOW 03/06/2023 1130   APPEARANCEUR HAZY (A) 03/06/2023 1130   LABSPEC 1.023 03/06/2023 1130   PHURINE 5.0 03/06/2023 1130   GLUCOSEU 50 (A) 03/06/2023 1130   HGBUR NEGATIVE 03/06/2023 1130   BILIRUBINUR NEGATIVE 03/06/2023 1130   KETONESUR NEGATIVE 03/06/2023 1130   PROTEINUR 100 (A) 03/06/2023 1130   UROBILINOGEN 0.2 10/21/2013 0200   NITRITE NEGATIVE 03/06/2023 1130   LEUKOCYTESUR NEGATIVE 03/06/2023 1130    Creatinine Clearance: Estimated Creatinine Clearance: 99.5 mL/min (by C-G formula based on SCr of 0.76 mg/dL).  Sepsis Labs: @LABRCNTIP (procalcitonin:4,lacticidven:4) ) Recent Results (from the past 240 hours)  Resp panel by RT-PCR (RSV, Flu A&B, Covid) Anterior Nasal Swab     Status: None   Collection Time: 03/04/24  5:15 PM   Specimen:  Anterior Nasal Swab  Result Value Ref Range Status   SARS Coronavirus 2 by RT PCR NEGATIVE NEGATIVE Final    Comment: (NOTE) SARS-CoV-2 target nucleic acids are NOT DETECTED.  The SARS-CoV-2 RNA is generally detectable in upper respiratory specimens during the acute phase of infection. The lowest concentration of SARS-CoV-2 viral copies this assay can detect is 138 copies/mL. A negative result does not preclude SARS-Cov-2 infection and should not be used as the sole basis for treatment or other patient management decisions. A negative result may occur with  improper specimen collection/handling, submission of specimen other than nasopharyngeal swab, presence of viral mutation(s) within the areas targeted by this assay, and inadequate number of viral copies(<138 copies/mL). A negative result must be combined with clinical observations, patient history, and epidemiological information. The expected result is Negative.  Fact Sheet for Patients:  BloggerCourse.com  Fact Sheet for Healthcare Providers:  SeriousBroker.it  This test is no t yet approved or cleared by the [place]  FDA and  has been authorized for detection and/or diagnosis of SARS-CoV-2 by FDA under an Emergency Use Authorization (EUA). This EUA will remain  in effect (meaning this test can be used) for the duration of the COVID-19 declaration under Section 564(b)(1) of the Act, 21 U.S.C.section 360bbb-3(b)(1), unless the authorization is terminated  or revoked sooner.       Influenza A by PCR NEGATIVE NEGATIVE Final   Influenza B by PCR NEGATIVE NEGATIVE Final    Comment: (NOTE) The Xpert Xpress SARS-CoV-2/FLU/RSV plus assay is intended as an aid in the diagnosis of influenza from Nasopharyngeal swab specimens and should not be used as a sole basis for treatment. Nasal washings and aspirates are unacceptable for Xpert Xpress SARS-CoV-2/FLU/RSV testing.  Fact  Sheet for Patients: BloggerCourse.com  Fact Sheet for Healthcare Providers: SeriousBroker.it  This test is not yet approved or cleared by the [place]  FDA and has been authorized for detection and/or diagnosis of SARS-CoV-2 by FDA under an Emergency Use Authorization (EUA). This EUA will remain in effect (meaning this test can be used) for  the duration of the COVID-19 declaration under Section 564(b)(1) of the Act, 21 U.S.C. section 360bbb-3(b)(1), unless the authorization is terminated or revoked.     Resp Syncytial Virus by PCR NEGATIVE NEGATIVE Final    Comment: (NOTE) Fact Sheet for Patients: BloggerCourse.com  Fact Sheet for Healthcare Providers: SeriousBroker.it  This test is not yet approved or cleared by the [place]  FDA and has been authorized for detection and/or diagnosis of SARS-CoV-2 by FDA under an Emergency Use Authorization (EUA). This EUA will remain in effect (meaning this test can be used) for the duration of the COVID-19 declaration under Section 564(b)(1) of the Act, 21 U.S.C. section 360bbb-3(b)(1), unless the authorization is terminated or revoked.  Performed at [facility], [address] [place], [place] [address]      Radiological Exams on Admission: DG Chest Port 1 View Result Date: 03/04/2024 CLINICAL DATA:  dyspnea EXAM: PORTABLE CHEST - 1 VIEW COMPARISON:  January 27, 2024 FINDINGS: The right lower lobe nodule on the prior CT is not well visualized on today's radiograph. no lobar consolidation, pleural effusion, or pneumothorax. No cardiomegaly. Aortic atherosclerosis. No acute fracture or destructive lesion. Multilevel thoracic osteophytosis. IMPRESSION: No acute cardiopulmonary abnormality. Electronically Signed   By: Rance Burrows M.D.   On: 03/04/2024 17:22     Assessment/Plan Active Problems:   COPD exacerbation (HCC)   Essential  hypertension   Non-small cell cancer of right lung (in remission)   Acute on chronic respiratory failure with hypoxia and hypercapnia (HCC)   GERD without esophagitis   Acute on chronic respiratory failure: secondary to COPD exacerbation. Weaned off BIPAP w/o decompensaiton after moving to floor from ED. Duoneb, methylpred given in ED. CXR w/o appreciable acute process.  - Duoneb Q6 - Prednisone  40mg  every day - Azithro  HTN: - continue home hydrochlorothiazide   GERD: - continue home PPI  MSK pain: chronic and at baseline.  - continue Oxycodone .   Insomnia: - continue trazodone .   Deopendent edema: - continue Lasix  PRN     DVT prophylaxis: Lovenox   Code Status: Full  Family Communication: none  Disposition Plan: pending improvement in respiratory status  Consults called: none  Admission status: inpt.    Otilia Bloch MD [facility]  If 7PM-7AM, please contact night-coverage [email or web address] Password [email or web address]  03/04/2024, 10:19 PM

## 2024-03-04 NOTE — ED Notes (Signed)
 ICU requested report after shift change due to multiple patients discharging the unit

## 2024-03-04 NOTE — Progress Notes (Signed)
 Patient requested to be removed from BIPAP and was placed om 3lpm [place]. Saturations are 92% HR 82 RR 18.

## 2024-03-04 NOTE — ED Notes (Signed)
 Attempted to call report, ICU RN to call this RN back when ready for report.

## 2024-03-04 NOTE — ED Provider Notes (Signed)
  EMERGENCY DEPARTMENT AT [facility] Provider Note   CSN: [id number] Arrival date & time: 03/04/24  1647     History  Chief Complaint  Patient presents with   Shortness of Breath    Miguel Marsh is a 63 y.o. male.  HPI Patient presents for shortness of breath.  Medical history includes COPD, lung cancer, HTN, GERD, anxiety.  At around the day, patient had onset of shortness of breath and chest tightness.  He used his albuterol  inhaler and nebulizer at home without relief.  EMS was called to the scene.  EMS noted increased work of breathing and diminished air movement on lung auscultation.  No further medications or treatments were given prior to arrival.  At baseline, patient wears 3 L of supplemental oxygen .  With EMS, he has been on 4 L with SpO2 in the high 90s.  Patient denies any areas of new pain.    Home Medications Prior to Admission medications   Medication Sig Start Date End Date Taking? Authorizing Provider  acetaminophen  (TYLENOL ) 325 MG tablet Take 2 tablets (650 mg total) by mouth every 6 (six) hours as needed for mild pain (pain score 1-3) (or Fever >/= 101). 01/31/24   Colin Dawley, MD  albuterol  (PROVENTIL ) (2.5 MG/3ML) 0.083% nebulizer solution Inhale 3 mLs (2.5 mg total) into the lungs every 6 (six) hours as needed for wheezing or shortness of breath. 01/31/24   Colin Dawley, MD  albuterol  (VENTOLIN  HFA) 108 (90 Base) MCG/ACT inhaler Inhale 2 puffs into the lungs every 6 (six) hours as needed for shortness of breath or wheezing (Cough). 01/31/24   Colin Dawley, MD  BREZTRI  AEROSPHERE 160-9-4.8 MCG/ACT AERO Inhale 2 puffs into the lungs 2 (two) times daily. 01/31/24   Colin Dawley, MD  clonazePAM  (KLONOPIN ) 1 MG tablet Take 1 tablet (1 mg total) by mouth 2 (two) times daily as needed for anxiety. 11/01/23   Colin Dawley, MD  furosemide  (LASIX ) 40 MG tablet Take 0.5 tablets (20 mg total) by mouth See admin instructions. Take once a day   as needed for fluid/swelling 11/01/23   Colin Dawley, MD  guaiFENesin  (MUCINEX ) 600 MG 12 hr tablet Take 1 tablet (600 mg total) by mouth 2 (two) times daily. 01/31/24 01/30/25  Colin Dawley, MD  hydrochlorothiazide  (HYDRODIURIL ) 25 MG tablet Take 25 mg by mouth every morning.    [provider]  nicotine  (NICODERM CQ  - DOSED IN MG/24 HOURS) 21 mg/24hr patch Place 21 mg onto the skin daily. 02/06/24   [provider]  omeprazole  (PRILOSEC) 40 MG capsule Take 1 capsule (40 mg total) by mouth daily. 01/31/24   Colin Dawley, MD  oxyCODONE  (ROXICODONE ) 15 MG immediate release tablet Take 15 mg by mouth every 4 (four) hours as needed for pain.    [provider]  OXYGEN  Inhale 3 L into the lungs continuous.     [provider]  traZODone  (DESYREL ) 50 MG tablet Take 1 tablet (50 mg total) by mouth at bedtime as needed for sleep. 01/31/24   Colin Dawley, MD      Allergies    Ibuprofen     Review of Systems   Review of Systems  Respiratory:  Positive for chest tightness and shortness of breath.   All other systems reviewed and are negative.   Physical Exam Updated Vital Signs BP 125/67   Pulse 91   Temp 98 F (36.7 C) (Oral)   Resp 18   Ht 5\' 7"  (1.702 m)  Wt 77.3 kg   SpO2 98%   BMI 26.69 kg/m  Physical Exam Vitals and nursing note reviewed.  Constitutional:      General: He is not in acute distress.    Appearance: Normal appearance. He is well-developed. He is ill-appearing (Chronically). He is not toxic-appearing or diaphoretic.  HENT:     Head: Normocephalic and atraumatic.     Right Ear: External ear normal.     Left Ear: External ear normal.     Nose: Nose normal.     Mouth/Throat:     Mouth: Mucous membranes are moist.  Eyes:     Extraocular Movements: Extraocular movements intact.     Conjunctiva/sclera: Conjunctivae normal.  Cardiovascular:     Rate and Rhythm: Normal rate and regular rhythm.     Heart sounds: No murmur  heard. Pulmonary:     Effort: Tachypnea, accessory muscle usage and prolonged expiration present. No respiratory distress.     Breath sounds: Decreased air movement present. Decreased breath sounds present.  Abdominal:     General: There is no distension.     Palpations: Abdomen is soft.     Tenderness: There is no abdominal tenderness.  Musculoskeletal:        General: No swelling. Normal range of motion.     Cervical back: Normal range of motion and neck supple.  Skin:    General: Skin is warm and dry.     Coloration: Skin is not jaundiced or pale.  Neurological:     General: No focal deficit present.     Mental Status: He is alert and oriented to person, place, and time.  Psychiatric:        Mood and Affect: Mood normal.        Behavior: Behavior normal.     ED Results / Procedures / Treatments   Labs (all labs ordered are listed, but only abnormal results are displayed) Labs Reviewed  COMPREHENSIVE METABOLIC PANEL WITH GFR - Abnormal; Notable for the following components:      Result Value   Chloride 97 (*)    CO2 36 (*)    Glucose, Bld 105 (*)    All other components within normal limits  BLOOD GAS, VENOUS - Abnormal; Notable for the following components:   pCO2, Ven 82 (*)    pO2, Ven 53 (*)    Bicarbonate 42.8 (*)    Acid-Base Excess 13.0 (*)    All other components within normal limits  CBC WITH DIFFERENTIAL/PLATELET - Abnormal; Notable for the following components:   RBC 4.09 (*)    Hemoglobin 12.6 (*)    Monocytes Absolute 1.2 (*)    All other components within normal limits  RESP PANEL BY RT-PCR (RSV, FLU A&B, COVID)  [id number]  BRAIN NATRIURETIC PEPTIDE  MAGNESIUM     EKG None  Radiology DG Chest Port 1 View Result Date: 03/04/2024 CLINICAL DATA:  dyspnea EXAM: PORTABLE CHEST - 1 VIEW COMPARISON:  January 27, 2024 FINDINGS: The right lower lobe nodule on the prior CT is not well visualized on today's radiograph. no lobar consolidation, pleural effusion, or  pneumothorax. No cardiomegaly. Aortic atherosclerosis. No acute fracture or destructive lesion. Multilevel thoracic osteophytosis. IMPRESSION: No acute cardiopulmonary abnormality. Electronically Signed   By: Rance Burrows M.D.   On: 03/04/2024 17:22    Procedures Procedures    Medications Ordered in ED Medications  albuterol  (PROVENTIL ) (2.5 MG/3ML) 0.083% nebulizer solution (10 mg/hr Nebulization New Bag/Given 03/04/24 1711)  methylPREDNISolone  sodium succinate (SOLU-MEDROL )  125 mg/2 mL injection 125 mg (125 mg Intravenous Given 03/04/24 1716)  ipratropium-albuterol  (DUONEB) 0.5-2.5 (3) MG/3ML nebulizer solution 3 mL (3 mLs Nebulization Given 03/04/24 1711)    ED Course/ Medical Decision Making/ A&P                                 Medical Decision Making Amount and/or Complexity of Data Reviewed Labs: ordered. Radiology: ordered.  Risk Prescription drug management.   This patient presents to the ED for concern of shortness of breath, this involves an extensive number of treatment options, and is a complaint that carries with it a high risk of complications and morbidity.  The differential diagnosis includes COPD exacerbation, CHF, pneumonia, acidosis, anemia, anxiety   Co morbidities that complicate the patient evaluation  COPD, lung cancer, HTN, GERD, anxiety   Additional history obtained:  Additional history obtained from EMS External records from outside source obtained and reviewed including EMR   Lab Tests:  I Ordered, and personally interpreted labs.  The pertinent results include: Normal hemoglobin, no leukocytosis, normal kidney function, normal electrolytes.  Blood gas shows a mild respiratory acidosis.   Imaging Studies ordered:  I ordered imaging studies including chest x-ray I independently visualized and interpreted imaging which showed no acute findings I agree with the radiologist interpretation   Cardiac Monitoring: / EKG:  The patient was  maintained on a cardiac monitor.  I personally viewed and interpreted the cardiac monitored which showed an underlying rhythm of: Sinus rhythm   Problem List / ED Course / Critical interventions / Medication management  Patient presenting for shortness of breath.  On arrival, patient has mildly increased work of breathing.  He is able to speak in complete sentences.  He describes onset of symptoms as earlier today.  On lung auscultation, he has severely diminished air movement.  Treatment for COPD exacerbation was initiated.  Patient's blood gas showed a respiratory acidosis.  BiPAP was ordered.  Lab work was otherwise unremarkable.  Chest x-ray showed no focal opacities.  Patient was admitted for further management. I ordered medication including Solu-Medrol , DuoNeb, albuterol  for COPD exacerbation Reevaluation of the patient after these medicines showed that the patient improved I have reviewed the patients home medicines and have made adjustments as needed   Social Determinants of Health:  Frequent hospital admissions         Final Clinical Impression(s) / ED Diagnoses Final diagnoses:  COPD exacerbation (HCC)  Acute on chronic respiratory failure with hypercapnia [facility])    Rx / DC Orders ED Discharge Orders     None         Iva Mariner, MD 03/04/24 1813

## 2024-03-04 NOTE — ED Triage Notes (Signed)
 Pt arrived via [facility] from group home for difficulty breathing. Pt presents on 3L [place]al [place] at baseline. Pt reports using inhaler w/o relief. EMS reports Pts lungs sound diminished in all lobes.

## 2024-03-05 DIAGNOSIS — J9621 Acute and chronic respiratory failure with hypoxia: Secondary | ICD-10-CM | POA: Diagnosis not present

## 2024-03-05 DIAGNOSIS — I10 Essential (primary) hypertension: Secondary | ICD-10-CM | POA: Diagnosis not present

## 2024-03-05 DIAGNOSIS — K219 Gastro-esophageal reflux disease without esophagitis: Secondary | ICD-10-CM | POA: Diagnosis not present

## 2024-03-05 DIAGNOSIS — J441 Chronic obstructive pulmonary disease with (acute) exacerbation: Secondary | ICD-10-CM | POA: Diagnosis not present

## 2024-03-05 LAB — RAPID URINE DRUG SCREEN, HOSP PERFORMED
Amphetamines: NOT DETECTED
Barbiturates: NOT DETECTED
Benzodiazepines: NOT DETECTED
Cocaine: NOT DETECTED
Opiates: NOT DETECTED
Tetrahydrocannabinol: NOT DETECTED

## 2024-03-05 LAB — GLUCOSE, CAPILLARY: Glucose-Capillary: 122 mg/dL — ABNORMAL HIGH (ref 70–99)

## 2024-03-05 LAB — HIV ANTIBODY (ROUTINE TESTING W REFLEX): HIV Screen 4th Generation wRfx: NONREACTIVE

## 2024-03-05 MED ORDER — ORAL CARE MOUTH RINSE: 15.0000 mL | OROMUCOSAL | Status: DC | PRN

## 2024-03-05 MED ORDER — ADULT MULTIVITAMIN W/MINERALS CH
1.0000 | ORAL_TABLET | Freq: Every day | ORAL | Status: DC
  Administered 2024-03-05 – 2024-03-06 (×2): 1 via ORAL
  Filled 2024-03-05 (×2): qty 1

## 2024-03-05 NOTE — TOC Initial Note (Signed)
 Transition of Care [facility]) - Initial/Assessment Note    Patient Details  Name: Miguel Marsh MRN: [id number] Date of Birth: Apr 17, 1961  Transition of Care [facility]) CM/SW Contact:    Grandville Lax, LCSWA Phone Number: 03/05/2024, 8:32 AM  Clinical Narrative:                 Pt is high risk for readmission. Pt is known to [facility] from past hospital admissions. Pt lives alone and has a CAP aide that comes to assist in home multiple hours a day, 7 days a week. Pt is on 3L O2 chronically at home. [facility] provides transportation to appointments when needed. Pt has a wheelchair that he uses. TOC to follow.    Expected Discharge Plan: Home/Self Care Barriers to Discharge: Continued Medical Work up   Patient Goals and CMS Choice Patient states their goals for this hospitalization and ongoing recovery are:: return home CMS Medicare.gov Compare Post Acute Care list provided to:: Patient Choice offered to / list presented to : Patient      Expected Discharge Plan and Services In-house Referral: Clinical Social Work Discharge Planning Services: CM Consult Post Acute Care Choice: Resumption of Svcs/PTA Provider Living arrangements for the past 2 months: Single Family Home                                      Prior Living Arrangements/Services Living arrangements for the past 2 months: Single Family Home Lives with:: Self Patient language and need for interpreter reviewed:: Yes Do you feel safe going back to the place where you live?: Yes      Need for Family Participation in Patient Care: Yes (Comment) Care giver support system in place?: Yes (comment) Current home services: DME Criminal Activity/Legal Involvement Pertinent to Current Situation/Hospitalization: No - Comment as needed  Activities of Daily Living   ADL Screening (condition at time of admission) Independently performs ADLs?: Yes (appropriate for developmental age) Is the patient deaf or have difficulty hearing?:  No Does the patient have difficulty seeing, even when wearing glasses/contacts?: No Does the patient have difficulty concentrating, remembering, or making decisions?: No  Permission Sought/Granted                  Emotional Assessment Appearance:: Appears stated age Attitude/Demeanor/Rapport: Engaged Affect (typically observed): Accepting Orientation: : Oriented to Self, Oriented to Place, Oriented to  Time, Oriented to Situation Alcohol  / Substance Use: Not Applicable Psych Involvement: No (comment)  Admission diagnosis:  COPD exacerbation (HCC) [J44.1] Acute on chronic respiratory failure (HCC) [J96.20] Acute on chronic respiratory failure with hypercapnia (HCC) [J96.22] Patient Active Problem List   Diagnosis Date Noted   Acute on chronic respiratory failure (HCC) 03/04/2024   Anxiety 01/15/2024   GERD without esophagitis 01/15/2024   Cocaine abuse (HCC) 09/13/2023   CAP (community acquired pneumonia) 06/24/2023   Lung nodule 06/24/2023   COVID-19 virus infection 06/24/2023   Elevated brain natriuretic peptide (BNP) level 03/06/2023   Elevated MCV 03/06/2023   Acute metabolic encephalopathy 02/01/2023   Leukocytosis 10/06/2020   Goals of care, counseling/discussion    Palliative care by specialist    DNR (do not resuscitate) discussion    Hyperglycemia 04/12/2020   Chronic respiratory failure with hypoxia and hypercapnia (HCC) 01/22/2020   Acute respiratory failure (HCC) 01/23/2018   Acute on chronic respiratory failure with hypoxia and hypercapnia (HCC) 01/23/2018   Acute on chronic  respiratory failure with hypoxia (HCC) 01/01/2018   Obesity, Class III, BMI 40-49.9 (morbid obesity) 01/01/2018   Acute exacerbation of chronic obstructive pulmonary disease (COPD) (HCC) 01/01/2018   Essential hypertension 12/09/2015   Non-small cell cancer of right lung (in remission) 12/09/2015   GERD (gastroesophageal reflux disease) 12/09/2015   Tobacco use disorder 12/17/2014    COPD exacerbation (HCC) 12/17/2014   Obesity 12/17/2014   Dyspnea    Difficulty walking 04/02/2013   Hip weakness 04/02/2013   PCP:  Lenn Quint, NP Pharmacy:   [facility] [phone] - [place], [place] - [address] [place] [address] Phone: [phone] Fax: [phone]     Social Drivers of Health (SDOH) Social History: SDOH Screenings   Food Insecurity: No Food Insecurity (03/04/2024)  Housing: Low Risk  (03/04/2024)  Transportation Needs: No Transportation Needs (03/04/2024)  Utilities: Not At Risk (03/04/2024)  Tobacco Use: Medium Risk (03/04/2024)   SDOH Interventions:     Readmission Risk Interventions    03/05/2024    8:31 AM 01/28/2024   12:56 PM 01/15/2024    8:41 AM  Readmission Risk Prevention Plan  Transportation Screening Complete Complete Complete  Medication Review Oceanographer) Complete Complete Complete  HRI or Home Care Consult Complete Complete Complete  SW Recovery Care/Counseling Consult Complete Complete Complete  Palliative Care Screening Not Applicable Not Applicable Not Applicable  Skilled Nursing Facility Not Applicable Not Applicable Not Applicable

## 2024-03-05 NOTE — Progress Notes (Signed)
   03/05/24 2217  BiPAP/CPAP/SIPAP  BiPAP/CPAP/SIPAP Pt Type Adult  BiPAP/CPAP/SIPAP DREAMSTATIOND  Respiratory Rate 18 breaths/min  IPAP 12 cmH20  EPAP 6 cmH2O  Flow Rate 5 lpm  Patient Home Machine No  Patient Home Mask No  Patient Home Tubing No  Auto Titrate No  Device Plugged into RED Power Outlet Yes  BiPAP/CPAP /SiPAP Vitals  Pulse Rate 76  Resp 18  SpO2 92 %  Bilateral Breath Sounds Clear  MEWS Score/Color  MEWS Score 0  MEWS Score Color Miguel Marsh

## 2024-03-05 NOTE — Evaluation (Signed)
 Occupational Therapy Evaluation Patient Details Name: Miguel Marsh MRN: [id number] DOB: [birth year] Today's Date: 03/05/2024   History of Present Illness   Miguel Marsh is a 63 y.o. male with medical history significant of metastatic MSCLC, COPD, HTN, GER, Chronic pain. Presenting w/ several day h/o progressive SOB. Albuterol  w/o beneift. EMS called after sx progressed to the point of causing chest discomfort. EMS noted pt was hy0poxic on baseline 3L [place] w/ increased WOB> (per MD)     Clinical Impressions Pt agreeable to OT and PT co-evaluation. Pt on ~3.5 to 3 L supplemental O2 during session. Pt is from a group home with IADL assist at baseline. Pt appears to be near baseline function at level of Mod I for ambulation, transfers, and ADL's based on observation listed below. Pt is not recommended for further acute OT services and will be discharged to care of nursing staff for remaining length of stay.      If plan is discharge home, recommend the following:   Assist for transportation;Assistance with cooking/housework     Functional Status Assessment   Patient has not had a recent decline in their functional status     Equipment Recommendations   None recommended by OT             Precautions/Restrictions   Precautions Precautions: Fall Recall of Precautions/Restrictions: Intact Restrictions Weight Bearing Restrictions Per Provider Order: No     Mobility Bed Mobility Overal bed mobility: Independent                  Transfers Overall transfer level: Modified independent Equipment used: Straight cane               General transfer comment: EOB to chair without AD and no assist; able to ambulate in hall with [facility]      Balance Overall balance assessment: Mild deficits observed, not formally tested                                         ADL either performed or assessed with clinical judgement   ADL Overall ADL's :  Modified independent                                       General ADL Comments: Able to doff and don sock seated at EOB; able to ambualte with SPC. Pt shows ability to care for  himself for ADL's without physical assist. Able to stand and urinate without assist.     Vision Baseline Vision/History: 1 Wears glasses Ability to See in Adequate Light: 1 Impaired Patient Visual Report: No change from baseline Vision Assessment?: Wears glasses for reading;No apparent visual deficits     Perception Perception: Not tested       Praxis Praxis: Not tested       Pertinent Vitals/Pain Pain Assessment Pain Assessment: No/denies pain     Extremity/Trunk Assessment Upper Extremity Assessment Upper Extremity Assessment: Right hand dominant;LUE deficits/detail LUE Deficits / Details: limited to 3-/5 shoulder flexion from previous shoulder injury; god functional use otherwise   Lower Extremity Assessment Lower Extremity Assessment: Defer to PT evaluation   Cervical / Trunk Assessment Cervical / Trunk Assessment: Normal   Communication Communication Communication: No apparent difficulties   Cognition Arousal: Alert Behavior During Therapy: [facility] for tasks assessed/performed  Cognition: No apparent impairments                               Following commands: Intact       Cueing  General Comments   Cueing Techniques: Verbal cues                 Home Living Family/patient expects to be discharged to:: Group home Living Arrangements: Group Home Available Help at Discharge: Available 24 hours/day;Other (Comment) (group home staff) Type of Home: Group Home Home Access: Level entry     Home Layout: One level     Bathroom Shower/Tub: Chief Strategy Officer: Handicapped height Bathroom Accessibility: Yes How Accessible: Accessible via wheelchair;Accessible via walker Home Equipment: Wheelchair - manual;Cane - single point;Shower  seat;Adaptive equipment Adaptive Equipment: Sock aid        Prior Functioning/Environment Prior Level of Function : Needs assist       Physical Assist : ADLs (physical)   ADLs (physical): IADLs Mobility Comments: Household and short distance ambulator with SPC. ADLs Comments: Pt reports independence with ADL's assist for IADL'w by group home staff.                            Co-evaluation PT/OT/SLP Co-Evaluation/Treatment: Yes Reason for Co-Treatment: To address functional/ADL transfers   OT goals addressed during session: ADL's and self-care                       End of Session Equipment Utilized During Treatment: Oxygen ;Other (comment) [facility]) Nurse Communication: Mobility status  Activity Tolerance: Patient tolerated treatment well Patient left: in chair;with call bell/phone within reach  OT Visit Diagnosis: Unsteadiness on feet (R26.81);Muscle weakness (generalized) (M62.81)                Time: 1610-9604 OT Time Calculation (min): 17 min Charges:  OT General Charges $OT Visit: 1 Visit OT Evaluation $OT Eval Low Complexity: 1 Low  Saide Lanuza OT, MOT   Thurnell Floss 03/05/2024, 10:13 AM

## 2024-03-05 NOTE — Progress Notes (Signed)
 Initial Nutrition Assessment  DOCUMENTATION CODES:   Not applicable  INTERVENTION:  Multivitamin w/ minerals daily Double protein portions with all meals  NUTRITION DIAGNOSIS:   Increased nutrient needs related to chronic illness (COPD) as evidenced by estimated needs.  GOAL:   Patient will meet greater than or equal to 90% of their needs  MONITOR:   PO intake, Weight trends, I & O's, Labs  REASON FOR ASSESSMENT:   Consult Assessment of nutrition requirement/status  ASSESSMENT:   63 y.o. male presented to the ED with SOB and chest discomfort. PMH includes GERD, HTN, NSCLC, COPD, and anxiety. Pt admitted with COPD exacerbation.   RD working remotely at time of assessment. Pt currently in ICU. RN reports that pt ate 100% of breakfast this morning. Pt last evaluated by RD team in 2021.   Per EMR, pt with weight gain over the past year. Pt with significant 8.5 kg weight gain within the past month, unsure if pt currently has edema or if bed was zeroed out prior to obtaining weight.   Medications: Zithromax , Protonix , Prednisone  Labs reviewed.   Respiratory: Nasal Cannula 4 L/min  UOP: 650 mL x 24 hrs  NUTRITION - FOCUSED PHYSICAL EXAM:  Deferred to follow-up.   Diet Order:   Diet Order             Diet regular Room service appropriate? Yes; Fluid consistency: Thin  Diet effective now                   EDUCATION NEEDS:   No education needs have been identified at this time  Skin:  Skin Assessment: Reviewed RN Assessment  Last BM:  5/5  Height:  Ht Readings from Last 1 Encounters:  03/04/24 5\' 7"  (1.702 m)   Weight:  Wt Readings from Last 1 Encounters:  03/04/24 84.7 kg   Ideal Body Weight:  67.3 kg  BMI:  Body mass index is 29.25 kg/m.  Estimated Nutritional Needs:  Kcal:  2000-2200 Protein:  100-120 grams Fluid:  >/= 2 L   Doneta Furbish RD, LDN Clinical Dietitian

## 2024-03-05 NOTE — Progress Notes (Signed)
 Patient stated he wants to go on Bipap at around 11pm.  Informed patient I would be back at that time.  Asked patient whether he uses one at home and patient stated that he does not.

## 2024-03-05 NOTE — Evaluation (Signed)
 Physical Therapy Evaluation Patient Details Name: Miguel Marsh MRN: [id number] DOB: 05/17/1961 Today's Date: 03/05/2024  History of Present Illness  Miguel Marsh is a 63 y.o. male with medical history significant of metastatic MSCLC, COPD, HTN, GER, Chronic pain. Presenting w/ several day h/o progressive SOB. Albuterol  w/o beneift. EMS called after sx progressed to the point of causing chest discomfort. EMS noted pt was hy0poxic on baseline 3L [place] w/ increased WOB>   Clinical Impression  Patient functioning near baseline for functional mobility and gait demonstrating good return for bed mobility, transfers and ambulating in room, hallways without loss of balance using SPC. Patient encouraged to ambulate with nursing staff and mobility techs as tolerated for length of stay. Plan:  Patient discharged from physical therapy to care of nursing for ambulation daily as tolerated for length of stay.          If plan is discharge home, recommend the following: Help with stairs or ramp for entrance;Assistance with cooking/housework   Can travel by private vehicle        Equipment Recommendations None recommended by PT  Recommendations for Other Services       Functional Status Assessment Patient has had a recent decline in their functional status and/or demonstrates limited ability to make significant improvements in function in a reasonable and predictable amount of time     Precautions / Restrictions Precautions Precautions: Fall Recall of Precautions/Restrictions: Intact Restrictions Weight Bearing Restrictions Per Provider Order: No      Mobility  Bed Mobility Overal bed mobility: Independent                  Transfers Overall transfer level: Modified independent Equipment used: Straight cane               General transfer comment: good return for transferrring to chair without AD or using [facility]    Ambulation/Gait Ambulation/Gait assistance: Modified  independent (Device/Increase time), Supervision Gait Distance (Feet): 100 Feet Assistive device: Straight cane Gait Pattern/deviations: Step-to pattern, Decreased step length - left, Decreased stance time - right, Decreased stride length Gait velocity: decreased     General Gait Details: good return for ambulating in room, hallways using SPC with mostly step-to pattern without loss of balance, on 2 LPM O2 with SpO2 at 89-92%  Stairs            Wheelchair Mobility     Tilt Bed    Modified Rankin (Stroke Patients Only)       Balance Overall balance assessment: Needs assistance Sitting-balance support: Feet supported, No upper extremity supported Sitting balance-Leahy Scale: Good Sitting balance - Comments: seated at EOB   Standing balance support: During functional activity, No upper extremity supported Standing balance-Leahy Scale: Fair Standing balance comment: fair/good using SPC                             Pertinent Vitals/Pain Pain Assessment Pain Assessment: No/denies pain    Home Living Family/patient expects to be discharged to:: Group home Living Arrangements: Group Home Available Help at Discharge: Available 24 hours/day;Other (Comment) Type of Home: Group Home Home Access: Level entry       Home Layout: One level Home Equipment: Wheelchair - manual;Cane - single point;Shower seat;Adaptive equipment      Prior Function Prior Level of Function : Needs assist       Physical Assist : ADLs (physical);Mobility (physical) Mobility (physical): Bed mobility;Transfers;Gait;Stairs ADLs (physical): IADLs  Mobility Comments: Household and short distance community ambulator with SPC. ADLs Comments: Pt reports independence with ADL's assist for IADL'w by group home staff.     Extremity/Trunk Assessment   Upper Extremity Assessment Upper Extremity Assessment: Defer to OT evaluation LUE Deficits / Details: limited to 3-/5 shoulder flexion from  previous shoulder injury; god functional use otherwise    Lower Extremity Assessment Lower Extremity Assessment: Overall WFL for tasks assessed    Cervical / Trunk Assessment Cervical / Trunk Assessment: Normal  Communication   Communication Communication: No apparent difficulties    Cognition Arousal: Alert Behavior During Therapy: WFL for tasks assessed/performed   PT - Cognitive impairments: No apparent impairments                         Following commands: Intact       Cueing Cueing Techniques: Verbal cues     General Comments      Exercises     Assessment/Plan    PT Assessment Patient does not need any further PT services  PT Problem List         PT Treatment Interventions      PT Goals (Current goals can be found in the Care Plan section)  Acute Rehab PT Goals Patient Stated Goal: return home with ALF staff to assist PT Goal Formulation: With patient Time For Goal Achievement: 03/05/24 Potential to Achieve Goals: Good    Frequency       Co-evaluation PT/OT/SLP Co-Evaluation/Treatment: Yes Reason for Co-Treatment: To address functional/ADL transfers PT goals addressed during session: Mobility/safety with mobility;Balance;Proper use of DME OT goals addressed during session: ADL's and self-care       AM-PAC PT "6 Clicks" Mobility  Outcome Measure Help needed turning from your back to your side while in a flat bed without using bedrails?: None Help needed moving from lying on your back to sitting on the side of a flat bed without using bedrails?: None Help needed moving to and from a bed to a chair (including a wheelchair)?: None Help needed standing up from a chair using your arms (e.g., wheelchair or bedside chair)?: None Help needed to walk in hospital room?: A Little Help needed climbing 3-5 steps with a railing? : A Little 6 Click Score: 22    End of Session Equipment Utilized During Treatment: Oxygen  Activity Tolerance:  Patient tolerated treatment well Patient left: in chair;with call bell/phone within reach Nurse Communication: Mobility status PT Visit Diagnosis: Unsteadiness on feet (R26.81);Other abnormalities of gait and mobility (R26.89);Muscle weakness (generalized) (M62.81)    Time: 4098-1191 PT Time Calculation (min) (ACUTE ONLY): 16 min   Charges:   PT Evaluation $PT Eval Low Complexity: 1 Low PT Treatments $Therapeutic Activity: 8-22 mins PT General Charges $$ ACUTE PT VISIT: 1 Visit         12:29 PM, 03/05/24 Walton Guppy, MPT Physical Therapist with [facility] 336 [phone] office [phone] mobile phone

## 2024-03-05 NOTE — Progress Notes (Signed)
  Progress Note   Patient: Miguel Marsh WGN:[id number] DOB: [birth year] DOA: 03/04/2024     1 DOS: the patient was seen and examined on 03/05/2024   Brief hospital admission narrative: As per H&P written by Dr. Ambrose Junk 03/04/24 COBY LAS is a 63 y.o. male with medical history significant of metastatic MSCLC, COPD, HTN, GER, Chronic pain. Presenting w/ several day h/o progressive SOB. Albuterol  w/o beneift. EMS called after sx progressed to the point of causing chest discomfort. EMS noted pt was hy0poxic on baseline 3L [place] w/ increased WOB>    ED Course: duoneb, methylpred given in ED and placed on BIPAP.   Assessment and plan 1-acute on chronic respiratory failure with hypoxia and hypercapnia - Appears to be secondary to COPD exacerbation and bronchiectasis - Continue oral antibiotics, steroids and bronchodilator management - Patient required transiently the use of BiPAP - Will provide 1 more night of BiPAP nightly and repeat ABG in a.m. - Patient will benefit of outpatient sleep study. - Mucolytic's and flutter valve has been ordered -Oxygen  supplementation has been titrated to/wean down to baseline regimen and appears stable.  2-hypertension - Overall stable - Continue current antihypertensive regimen - Follow vital signs.  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  Subjective:  No fever, no chest pain, no nausea, no vomiting.  Reports breathing improving and currently using baseline home 3 L supplementation regimen.  Physical Exam: Vitals:   03/05/24 0700 03/05/24 0720 03/05/24 0740 03/05/24 0800  BP: (!) 105/54   136/76  Pulse: 76   80  Resp: 13   13  Temp:  (!) 97.2 F (36.2 C)    TempSrc:  Oral    SpO2: 97%  99% 95%  Weight:      Height:        General exam: Alert, awake, oriented x 3; speaking in full sentences and just expressing intermittent nonproductive coughing spell and short winded sensation with activity. Respiratory system: Fair air movement bilaterally; positive expiratory wheezing and rhonchi appreciated.  No using accessory muscle. Cardiovascular system:RRR. No rubs or gallops. Gastrointestinal system: Abdomen is nondistended, soft and nontender.  Positive bowel sounds. Central nervous system: Mood in 4 limbs spontaneously.  No focal neurological deficits. Extremities: No cyanosis or clubbing; trace edema appreciated bilaterally. Skin: No petechiae. Psychiatry: Judgement and insight appear normal. Mood & affect appropriate.    Data Reviewed: UDS: Negative -Respiratory panel: Negative for COVID, influenza and RSV Magnesium : 1.9 CBC: WBC 6.8, hemoglobin 12.6 and platelet count 218K  Family Communication: No family at bedside.  Disposition: Status is: Inpatient Remains inpatient appropriate because: IV therapy and respiratory management/bronchodilator support  Time spent: 50 minutes  Author: Justina Oman, MD 03/05/2024 10:50 AM  For on call review [email or web address].

## 2024-03-06 DIAGNOSIS — I10 Essential (primary) hypertension: Secondary | ICD-10-CM | POA: Diagnosis not present

## 2024-03-06 DIAGNOSIS — J9621 Acute and chronic respiratory failure with hypoxia: Secondary | ICD-10-CM | POA: Diagnosis not present

## 2024-03-06 DIAGNOSIS — J441 Chronic obstructive pulmonary disease with (acute) exacerbation: Secondary | ICD-10-CM | POA: Diagnosis not present

## 2024-03-06 DIAGNOSIS — C3491 Malignant neoplasm of unspecified part of right bronchus or lung: Secondary | ICD-10-CM | POA: Diagnosis not present

## 2024-03-06 LAB — BASIC METABOLIC PANEL WITH GFR
Anion gap: 6 (ref 5–15)
BUN: 16 mg/dL (ref 8–23)
CO2: 37 mmol/L — ABNORMAL HIGH (ref 22–32)
Calcium: 9 mg/dL (ref 8.9–10.3)
Chloride: 94 mmol/L — ABNORMAL LOW (ref 98–111)
Creatinine, Ser: 0.81 mg/dL (ref 0.61–1.24)
GFR, Estimated: >60 mL/min (ref >60)
Glucose, Bld: 119 mg/dL — ABNORMAL HIGH (ref 70–99)
Potassium: 3.8 mmol/L (ref 3.5–5.1)
Sodium: 137 mmol/L (ref 135–145)

## 2024-03-06 LAB — BLOOD GAS, ARTERIAL
Acid-Base Excess: 13 mmol/L — ABNORMAL HIGH (ref 0.0–2.0)
Bicarbonate: 41.4 mmol/L — ABNORMAL HIGH (ref 20.0–28.0)
Drawn by: 38235
O2 Saturation: 91.7 %
Patient temperature: 37
pCO2 arterial: 70 mmHg (ref 32–48)
pH, Arterial: 7.38 (ref 7.35–7.45)
pO2, Arterial: 58 mmHg — ABNORMAL LOW (ref 83–108)

## 2024-03-06 LAB — CBC
HCT: 34.5 % — ABNORMAL LOW (ref 39.0–52.0)
Hemoglobin: 11.1 g/dL — ABNORMAL LOW (ref 13.0–17.0)
MCH: 30.7 pg (ref 26.0–34.0)
MCHC: 32.2 g/dL (ref 30.0–36.0)
MCV: 95.6 fL (ref 80.0–100.0)
Platelets: 180 10*3/uL (ref 150–400)
RBC: 3.61 MIL/uL — ABNORMAL LOW (ref 4.22–5.81)
RDW: 12.1 % (ref 11.5–15.5)
WBC: 12.5 10*3/uL — ABNORMAL HIGH (ref 4.0–10.5)
nRBC: 0 % (ref 0.0–0.2)

## 2024-03-06 MED ORDER — OMEPRAZOLE 40 MG PO CPDR: 40.0000 mg | DELAYED_RELEASE_CAPSULE | Freq: Two times a day (BID) | ORAL | 2 refills | Status: DC

## 2024-03-06 MED ORDER — AZITHROMYCIN 250 MG PO TABS: 250.0000 mg | ORAL_TABLET | Freq: Every day | ORAL | 0 refills | Status: DC

## 2024-03-06 MED ORDER — AZITHROMYCIN 250 MG PO TABS: 250.0000 mg | ORAL_TABLET | Freq: Every day | ORAL | 0 refills | Status: AC

## 2024-03-06 MED ORDER — PREDNISONE 20 MG PO TABS: ORAL_TABLET | ORAL | 0 refills | Status: DC

## 2024-03-06 NOTE — TOC Transition Note (Signed)
 Transition of Care [facility]) - Discharge Note   Patient Details  Name: Miguel Marsh MRN: [id number] Date of Birth: [birth year]  Transition of [facility]) CM/SW Contact:  Grandville Lax, LCSWA Phone Number: 03/06/2024, 2:47 PM  Clinical Narrative:    CSW updated that pt is from a family care home. CSW spoke to [place] who states pt is new to her facility and has been there for about 2 days. Pt ca return. CSW completed Fl2 and sent that with D/C summary to [facility] via secure email. [facility] called for transport. RN updated on plan for D/C. TOC signing off.   Final next level of care: Rest Home Barriers to Discharge: Barriers Resolved   Patient Goals and CMS Choice Patient states their goals for this hospitalization and ongoing recovery are:: return to [facility] CMS Medicare.gov Compare Post Acute Care list provided to:: Patient Choice offered to / list presented to : Patient      Discharge Placement                       Discharge Plan and Services Additional resources added to the After Visit Summary for   In-house Referral: Clinical Social Work Discharge Planning Services: CM Consult Post Acute Care Choice: Resumption of Svcs/PTA Provider                               Social Drivers of Health (SDOH) Interventions SDOH Screenings   Food Insecurity: No Food Insecurity (03/04/2024)  Housing: Low Risk  (03/04/2024)  Transportation Needs: No Transportation Needs (03/04/2024)  Utilities: Not At Risk (03/04/2024)  Tobacco Use: Medium Risk (03/04/2024)     Readmission Risk Interventions    03/05/2024    8:31 AM 01/28/2024   12:56 PM 01/15/2024    8:41 AM  Readmission Risk Prevention Plan  Transportation Screening Complete Complete Complete  Medication Review Oceanographer) Complete Complete Complete  HRI or Home Care Consult Complete Complete Complete  SW Recovery Care/Counseling Consult Complete Complete Complete  Palliative Care Screening Not Applicable Not Applicable Not  Applicable  Skilled Nursing Facility Not Applicable Not Applicable Not Applicable

## 2024-03-06 NOTE — Care Management Important Message (Signed)
 Important Message  Patient Details  Name: Miguel Marsh MRN: [id number] Date of Birth: October 29, 1961   Important Message Given:  N/A - LOS <3 / Initial given by admissions     Miguel Marsh 03/06/2024, 1:44 PM

## 2024-03-06 NOTE — Progress Notes (Signed)
   03/06/24 0981  Provider Notification  Provider Name/Title Arrien, MD  Date Provider Notified 03/06/24  Time Provider Notified [phone]  Method of Notification Page  Notification Reason Critical Result  Test performed and critical result pCO2 70  Date Critical Result Received 03/06/24  Time Critical Result Received 0615  Provider response No new orders  Date of Provider Response 03/06/24  Time of Provider Response [phone]

## 2024-03-06 NOTE — Plan of Care (Signed)

## 2024-03-06 NOTE — Discharge Summary (Signed)
 Physician Discharge Summary   Patient: Miguel Marsh MRN: [id number] DOB: Jun 17, 1961  Admit date:     03/04/2024  Discharge date: 03/06/24  Discharge Physician: Justina Oman   PCP: Lenn Quint, NP   Recommendations at discharge:  Repeat basic metabolic panel to follow ultralights renal function Make sure patient follow-up with pulmonologist as recommended for PFTs and a sleep study as an outpatient Reassess blood pressure and further adjust antihypertensive regimen  Discharge Diagnoses: Active Problems:   COPD exacerbation (HCC)   Essential hypertension   Non-small cell cancer of right lung (in remission)   Acute on chronic respiratory failure with hypoxia and hypercapnia (HCC)   GERD without esophagitis  Brief hospital admission narrative: As per H&P written by Dr. Ambrose Junk 03/04/24 Miguel Marsh is a 63 y.o. male with medical history significant of metastatic MSCLC, COPD, HTN, GER, Chronic pain. Presenting w/ several day h/o progressive SOB. Albuterol  w/o beneift. EMS called after sx progressed to the point of causing chest discomfort. EMS noted pt was hy0poxic on baseline 3L [place] w/ increased WOB>    ED Course: duoneb, methylpred given in ED and placed on BIPAP.   Assessment and Plan: 1-acute on chronic respiratory failure with hypoxia and hypercapnia - Appears to be secondary to COPD exacerbation and bronchiectasis - Excellent response to the steroids, bronchodilator management, mucolytic's and antibiotics - Patient require transient use of BiPAP with completed stabilization of his ABG and stabilization of oxygen  saturation to chronic supplementation (3 L). - Patient has been discharged home on his steroids tapering, instruction to resume bronchodilator management, continue mucolytic's and oral antibiotics - Outpatient follow-up with pulmonologist recommended for PFTs, sleep study and further adjustment to maintenance therapy of his COPD.  2-hypertension - Overall  stable - Continue current antihypertensive regimen - Heart healthy diet discussed with patient.   3-GERD - Continue PPI -Dose adjusted to twice a day for GI prophylaxis and very symptomatic management.   4-dependent edema - Continue as needed Lasix  and resume the use of daily HCTZ. -Low-sodium diet and leg elevation has been advised.   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   Consultants: None Procedures performed: See below for x-ray reports. Disposition: Home Diet recommendation: Heart healthy/low-sodium diet.  DISCHARGE MEDICATION: Allergies as of 03/06/2024       Reactions   Ibuprofen  Shortness Of Breath        Medication List     TAKE these medications    acetaminophen  325 MG tablet Commonly known as: TYLENOL  Take 2 tablets (650 mg total) by mouth every 6 (six) hours as needed for mild pain (pain score 1-3) (or Fever >/= 101).   albuterol  108 (90 Base) MCG/ACT inhaler Commonly known as: VENTOLIN  HFA Inhale 2 puffs into the lungs every 6 (six) hours as needed for shortness of breath or wheezing (Cough).   albuterol  (2.5 MG/3ML) 0.083% nebulizer solution Commonly known as: PROVENTIL  Inhale 3 mLs (2.5 mg total) into the lungs every 6 (six) hours as needed for wheezing or shortness of breath.   azithromycin  250 MG tablet Commonly known as: ZITHROMAX  Take 1 tablet (250 mg total) by mouth daily for 4 doses.   Breztri  Aerosphere 160-9-4.8 MCG/ACT Aero inhaler Generic drug: budeson-glycopyrrolate-formoterol  Inhale 2 puffs into the lungs 2 (two) times daily.   clonazePAM  1 MG tablet Commonly known as: KLONOPIN  Take 1 tablet (1  mg total) by mouth 2 (two) times daily as needed for anxiety.   furosemide  40 MG tablet Commonly known as: LASIX  Take 0.5 tablets (20 mg total) by mouth See admin instructions. Take once a day   as needed for fluid/swelling   guaiFENesin  600 MG 12 hr tablet Commonly known as: Mucinex  Take 1 tablet (600 mg total) by mouth 2 (two) times daily.   hydrochlorothiazide  25 MG tablet Commonly known as: HYDRODIURIL  Take 25 mg by mouth every morning.   nicotine  21 mg/24hr patch Commonly known as: NICODERM CQ  - dosed in mg/24 hours Place 21 mg onto the skin daily.   omeprazole  40 MG capsule Commonly known as: PRILOSEC Take 1 capsule (40 mg total) by mouth in the morning and at bedtime. What changed: when to take this   oxyCODONE  15 MG immediate release tablet Commonly known as: ROXICODONE  Take 15 mg by mouth every 4 (four) hours as needed for pain.   OXYGEN  Inhale 3 L into the lungs continuous.   predniSONE  20 MG tablet Commonly known as: DELTASONE  Take 3 tablets by mouth daily x 1 day; then 2 tablets by mouth daily x 2 days; then 1 tablet by mouth daily x 3 days; then half tablet by mouth daily x 3 days and stop prednisone . Start taking on: Mar 07, 2024   traZODone  50 MG tablet Commonly known as: DESYREL  Take 1 tablet (50 mg total) by mouth at bedtime as needed for sleep.        Follow-up Information     Lenn Quint, NP. Schedule an appointment as soon as possible for a visit in 10 day(s).   Specialty: Internal Medicine Contact information: [address] [place] [address] [phone]                Discharge Exam: Miguel Marsh Weights   03/04/24 1706 03/04/24 2043 03/05/24 1521  Weight: 77.3 kg 84.7 kg 86.7 kg   General exam: Alert, awake, oriented x 3; speaking in full sentences and feeling ready to go home.  Good saturation on 3 L supplementation. Respiratory system: No use of accessory muscles; improved air movement appreciated bilaterally.  Positive scattered rhonchi and minimal expiratory wheezing on exam. Cardiovascular system:RRR. No murmurs, rubs, gallops. Gastrointestinal system: Abdomen is nondistended, soft and nontender. No organomegaly  or masses felt. Normal bowel sounds heard. Central nervous system: No focal neurological deficits. Extremities: No cyanosis or clubbing; trace edema appreciated bilaterally. Skin: No rashes, lesions or ulcers Psychiatry: Judgement and insight appear normal. Mood & affect appropriate.    Condition at discharge: Stable and improved.  The results of significant diagnostics from this hospitalization (including imaging, microbiology, ancillary and laboratory) are listed below for reference.   Imaging Studies: DG Chest Port 1 View Result Date: 03/04/2024 CLINICAL DATA:  dyspnea EXAM: PORTABLE CHEST - 1 VIEW COMPARISON:  January 27, 2024 FINDINGS: The right lower lobe nodule on the prior CT is not well visualized on today's radiograph. no lobar consolidation, pleural effusion, or pneumothorax. No cardiomegaly. Aortic atherosclerosis. No acute fracture or destructive lesion. Multilevel thoracic osteophytosis. IMPRESSION: No acute cardiopulmonary abnormality. Electronically Signed   By: Rance Burrows M.D.   On: 03/04/2024 17:22    Microbiology: Results for orders placed or performed during the hospital encounter of 03/04/24  Resp panel by RT-PCR (RSV, Flu A&B, Covid) Anterior Nasal Swab     Status: None   Collection Time: 03/04/24  5:15 PM   Specimen: Anterior Nasal Swab  Result Value  Ref Range Status   SARS Coronavirus 2 by RT PCR NEGATIVE NEGATIVE Final    Comment: (NOTE) SARS-CoV-2 target nucleic acids are NOT DETECTED.  The SARS-CoV-2 RNA is generally detectable in upper respiratory specimens during the acute phase of infection. The lowest concentration of SARS-CoV-2 viral copies this assay can detect is 138 copies/mL. A negative result does not preclude SARS-Cov-2 infection and should not be used as the sole basis for treatment or other patient management decisions. A negative result may occur with  improper specimen collection/handling, submission of specimen other than nasopharyngeal  swab, presence of viral mutation(s) within the areas targeted by this assay, and inadequate number of viral copies(<138 copies/mL). A negative result must be combined with clinical observations, patient history, and epidemiological information. The expected result is Negative.  Fact Sheet for Patients:  BloggerCourse.com  Fact Sheet for Healthcare Providers:  SeriousBroker.it  This test is no t yet approved or cleared by the [place]  FDA and  has been authorized for detection and/or diagnosis of SARS-CoV-2 by FDA under an Emergency Use Authorization (EUA). This EUA will remain  in effect (meaning this test can be used) for the duration of the COVID-19 declaration under Section 564(b)(1) of the Act, 21 U.S.C.section 360bbb-3(b)(1), unless the authorization is terminated  or revoked sooner.       Influenza A by PCR NEGATIVE NEGATIVE Final   Influenza B by PCR NEGATIVE NEGATIVE Final    Comment: (NOTE) The Xpert Xpress SARS-CoV-2/FLU/RSV plus assay is intended as an aid in the diagnosis of influenza from Nasopharyngeal swab specimens and should not be used as a sole basis for treatment. Nasal washings and aspirates are unacceptable for Xpert Xpress SARS-CoV-2/FLU/RSV testing.  Fact Sheet for Patients: BloggerCourse.com  Fact Sheet for Healthcare Providers: SeriousBroker.it  This test is not yet approved or cleared by the [place]  FDA and has been authorized for detection and/or diagnosis of SARS-CoV-2 by FDA under an Emergency Use Authorization (EUA). This EUA will remain in effect (meaning this test can be used) for the duration of the COVID-19 declaration under Section 564(b)(1) of the Act, 21 U.S.C. section 360bbb-3(b)(1), unless the authorization is terminated or revoked.     Resp Syncytial Virus by PCR NEGATIVE NEGATIVE Final    Comment: (NOTE) Fact Sheet for  Patients: BloggerCourse.com  Fact Sheet for Healthcare Providers: SeriousBroker.it  This test is not yet approved or cleared by the [place]  FDA and has been authorized for detection and/or diagnosis of SARS-CoV-2 by FDA under an Emergency Use Authorization (EUA). This EUA will remain in effect (meaning this test can be used) for the duration of the COVID-19 declaration under Section 564(b)(1) of the Act, 21 U.S.C. section 360bbb-3(b)(1), unless the authorization is terminated or revoked.  Performed at [facility], [address], [place], [place] [address]   MRSA Next Gen by PCR, Nasal     Status: None   Collection Time: 03/04/24  8:45 PM   Specimen: Nasal Mucosa; Nasal Swab  Result Value Ref Range Status   MRSA by PCR Next Gen NOT DETECTED NOT DETECTED Final    Comment: (NOTE) The GeneXpert MRSA Assay (FDA approved for NASAL specimens only), is one component of a comprehensive MRSA colonization surveillance program. It is not intended to diagnose MRSA infection nor to guide or monitor treatment for MRSA infections. Test performance is not FDA approved in patients less than 75 years old. Performed at [facility], [address], [place], [place] [address]  Labs: CBC: Recent Labs  Lab 03/04/24 1702 03/06/24 0457  WBC 6.8 12.5*  NEUTROABS 2.9  --   HGB 12.6* 11.1*  HCT 40.1 34.5*  MCV 98.0 95.6  PLT 218 180   Basic Metabolic Panel: Recent Labs  Lab 03/04/24 1702 03/06/24 0457  NA 139 137  K 4.1 3.8  CL 97* 94*  CO2 36* 37*  GLUCOSE 105* 119*  BUN 11 16  CREATININE 0.76 0.81  CALCIUM 9.0 9.0  MG 1.9  --    Liver Function Tests: Recent Labs  Lab 03/04/24 1702  AST 17  ALT 16  ALKPHOS 98  BILITOT 0.6  PROT 7.0  ALBUMIN 3.6   CBG: Recent Labs  Lab 03/05/24 1116  GLUCAP 122*    Discharge time spent: greater than 30 minutes.  Signed: Justina Oman, MD [facility] 03/06/2024

## 2024-03-06 NOTE — Progress Notes (Signed)
 Went in to see if patient was sleeping and still on BIPAP machine.  Patient was back on [place] and was off BIPAP.  Gave patient treatment, BS were diminished but still heard a slight wheeze.

## 2024-03-06 NOTE — NC FL2 (Signed)
 [place]  MEDICAID FL2 LEVEL OF CARE FORM     IDENTIFICATION  Patient Name: Miguel Marsh Birthdate: [birth year] Sex: male Admission Date (Current Location): 03/04/2024  [facility] and [place] Number:  [name] and Address:  [facility],  [address], [name] [address]      Provider Number: [id number]  Attending Physician Name and Address:  Justina Oman, MD  Relative Name and Phone Number:       Current Level of Care: Hospital Recommended Level of Care: [facility] Prior Approval Number:    Date Approved/Denied:   PASRR Number:    Discharge Plan: Other (Comment) [facility])    Current Diagnoses: Patient Active Problem List   Diagnosis Date Noted   Acute on chronic respiratory failure (HCC) 03/04/2024   Anxiety 01/15/2024   GERD without esophagitis 01/15/2024   Cocaine abuse (HCC) 09/13/2023   CAP (community acquired pneumonia) 06/24/2023   Lung nodule 06/24/2023   COVID-19 virus infection 06/24/2023   Elevated brain natriuretic peptide (BNP) level 03/06/2023   Elevated MCV 03/06/2023   Acute metabolic encephalopathy 02/01/2023   Leukocytosis 10/06/2020   Goals of care, counseling/discussion    Palliative care by specialist    DNR (do not resuscitate) discussion    Hyperglycemia 04/12/2020   Chronic respiratory failure with hypoxia and hypercapnia (HCC) 01/22/2020   Acute respiratory failure (HCC) 01/23/2018   Acute on chronic respiratory failure with hypoxia and hypercapnia (HCC) 01/23/2018   Acute on chronic respiratory failure with hypoxia (HCC) 01/01/2018   Obesity, Class III, BMI 40-49.9 (morbid obesity) 01/01/2018   Acute exacerbation of chronic obstructive pulmonary disease (COPD) (HCC) 01/01/2018   Essential hypertension 12/09/2015   Non-small cell cancer of right lung (in remission) 12/09/2015   GERD (gastroesophageal reflux disease) 12/09/2015   Tobacco use disorder 12/17/2014   COPD exacerbation (HCC) 12/17/2014    Obesity 12/17/2014   Dyspnea    Difficulty walking 04/02/2013   Hip weakness 04/02/2013    Orientation RESPIRATION BLADDER Height & Weight     Self, Time, Situation, Place  O2 (4.5) Continent Weight: 191 lb 3.2 oz (86.7 kg) Height:  5\' 7"  (170.2 cm)  BEHAVIORAL SYMPTOMS/MOOD NEUROLOGICAL BOWEL NUTRITION STATUS      Continent Diet (Regular)  AMBULATORY STATUS COMMUNICATION OF NEEDS Skin   Supervision Verbally Normal                       Personal Care Assistance Level of Assistance  Bathing, Feeding, Dressing Bathing Assistance: Limited assistance Feeding assistance: Independent Dressing Assistance: Limited assistance     Functional Limitations Info  Sight, Hearing, Speech Sight Info: Adequate Hearing Info: Adequate Speech Info: Adequate    SPECIAL CARE FACTORS FREQUENCY                       Contractures Contractures Info: Not present    Additional Factors Info  Code Status, Allergies Code Status Info: FULL Allergies Info: Ibuprofen            Current Medications (03/06/2024):  This is the current hospital active medication list Current Facility-Administered Medications  Medication Dose Route Frequency Provider Last Rate Last Admin   azithromycin  (ZITHROMAX ) tablet 500 mg  500 mg Oral QHS Justina Oman, MD   500 mg at 03/05/24 2204   budeson-glycopyrrolate-formoterol  (BREZTRI ) 160-9-4.8 MCG/ACT inhaler 2 puff  2 puff Inhalation BID Justina Oman, MD   2 puff at 03/06/24 0744   clonazePAM  (KLONOPIN ) tablet 1  mg  1 mg Oral BID PRN Justina Oman, MD   1 mg at 03/06/24 1033   enoxaparin  (LOVENOX ) injection 40 mg  40 mg Subcutaneous Q24H Justina Oman, MD   40 mg at 03/06/24 0919   furosemide  (LASIX ) tablet 20 mg  20 mg Oral Daily PRN Justina Oman, MD       hydrochlorothiazide  (HYDRODIURIL ) tablet 25 mg  25 mg Oral q morning Justina Oman, MD   25 mg at 03/06/24 0920   ipratropium-albuterol  (DUONEB) 0.5-2.5 (3) MG/3ML nebulizer solution 3 mL  3 mL  Nebulization Q6H Justina Oman, MD   3 mL at 03/06/24 1416   multivitamin with minerals tablet 1 tablet  1 tablet Oral Daily Justina Oman, MD   1 tablet at 03/06/24 1610   nicotine  (NICODERM CQ  - dosed in mg/24 hours) patch 21 mg  21 mg Transdermal Daily Justina Oman, MD       ondansetron  (ZOFRAN ) tablet 4 mg  4 mg Oral Q6H PRN Justina Oman, MD       Or   ondansetron  (ZOFRAN ) injection 4 mg  4 mg Intravenous Q6H PRN Justina Oman, MD       Oral care mouth rinse  15 mL Mouth Rinse PRN Justina Oman, MD       oxyCODONE  (Oxy IR/ROXICODONE ) immediate release tablet 15 mg  15 mg Oral Q4H PRN Justina Oman, MD   15 mg at 03/06/24 9604   pantoprazole  (PROTONIX ) EC tablet 40 mg  40 mg Oral Daily Justina Oman, MD   40 mg at 03/06/24 5409   predniSONE  (DELTASONE ) tablet 40 mg  40 mg Oral Q breakfast Justina Oman, MD   40 mg at 03/06/24 8119   traZODone  (DESYREL ) tablet 50 mg  50 mg Oral QHS PRN Justina Oman, MD         Discharge Medications: Allergies as of 03/06/2024       Reactions   Ibuprofen  Shortness Of Breath        Medication List     TAKE these medications    acetaminophen  325 MG tablet Commonly known as: TYLENOL  Take 2 tablets (650 mg total) by mouth every 6 (six) hours as needed for mild pain (pain score 1-3) (or Fever >/= 101).   albuterol  108 (90 Base) MCG/ACT inhaler Commonly known as: VENTOLIN  HFA Inhale 2 puffs into the lungs every 6 (six) hours as needed for shortness of breath or wheezing (Cough).   albuterol  (2.5 MG/3ML) 0.083% nebulizer solution Commonly known as: PROVENTIL  Inhale 3 mLs (2.5 mg total) into the lungs every 6 (six) hours as needed for wheezing or shortness of breath.   azithromycin  250 MG tablet Commonly known as: ZITHROMAX  Take 1 tablet (250 mg total) by mouth daily for 4 doses.   Breztri  Aerosphere 160-9-4.8 MCG/ACT Aero inhaler Generic drug: budeson-glycopyrrolate-formoterol  Inhale 2 puffs into the lungs 2 (two) times daily.    clonazePAM  1 MG tablet Commonly known as: KLONOPIN  Take 1 tablet (1 mg total) by mouth 2 (two) times daily as needed for anxiety.   furosemide  40 MG tablet Commonly known as: LASIX  Take 0.5 tablets (20 mg total) by mouth See admin instructions. Take once a day  as needed for fluid/swelling   guaiFENesin  600 MG 12 hr tablet Commonly known as: Mucinex  Take 1 tablet (600 mg total) by mouth 2 (two) times daily.   hydrochlorothiazide  25 MG tablet Commonly known as: HYDRODIURIL  Take 25 mg by mouth every morning.   nicotine  21 mg/24hr patch Commonly known as:  NICODERM CQ  - dosed in mg/24 hours Place 21 mg onto the skin daily.   omeprazole  40 MG capsule Commonly known as: PRILOSEC Take 1 capsule (40 mg total) by mouth in the morning and at bedtime. What changed: when to take this   oxyCODONE  15 MG immediate release tablet Commonly known as: ROXICODONE  Take 15 mg by mouth every 4 (four) hours as needed for pain.   OXYGEN  Inhale 3 L into the lungs continuous.   predniSONE  20 MG tablet Commonly known as: DELTASONE  Take 3 tablets by mouth daily x 1 day; then 2 tablets by mouth daily x 2 days; then 1 tablet by mouth daily x 3 days; then half tablet by mouth daily x 3 days and stop prednisone . Start taking on: Mar 07, 2024   traZODone  50 MG tablet Commonly known as: DESYREL  Take 1 tablet (50 mg total) by mouth at bedtime as needed for sleep.         Relevant Imaging Results:  Relevant Lab Results:   Additional Information SSN: [id number], LCSWA

## 2024-04-02 ENCOUNTER — Observation Stay (HOSPITAL_COMMUNITY)
Admission: EM | Admit: 2024-04-02 | Discharge: 2024-04-03 | Disposition: A | Discharge status: Home / Self Care | Attending: Family Medicine | Admitting: Family Medicine

## 2024-04-02 ENCOUNTER — Emergency Department (HOSPITAL_COMMUNITY)

## 2024-04-02 ENCOUNTER — Other Ambulatory Visit: Payer: Self-pay

## 2024-04-02 ENCOUNTER — Encounter (HOSPITAL_COMMUNITY): Payer: Self-pay | Admitting: *Deleted

## 2024-04-02 DIAGNOSIS — K219 Gastro-esophageal reflux disease without esophagitis: Secondary | ICD-10-CM | POA: Diagnosis present

## 2024-04-02 DIAGNOSIS — J441 Chronic obstructive pulmonary disease with (acute) exacerbation: Secondary | ICD-10-CM | POA: Diagnosis not present

## 2024-04-02 DIAGNOSIS — J449 Chronic obstructive pulmonary disease, unspecified: Secondary | ICD-10-CM | POA: Diagnosis present

## 2024-04-02 DIAGNOSIS — J189 Pneumonia, unspecified organism: Secondary | ICD-10-CM | POA: Diagnosis not present

## 2024-04-02 DIAGNOSIS — R059 Cough, unspecified: Secondary | ICD-10-CM | POA: Insufficient documentation

## 2024-04-02 DIAGNOSIS — Z96641 Presence of right artificial hip joint: Secondary | ICD-10-CM | POA: Insufficient documentation

## 2024-04-02 DIAGNOSIS — J9600 Acute respiratory failure, unspecified whether with hypoxia or hypercapnia: Secondary | ICD-10-CM | POA: Diagnosis present

## 2024-04-02 DIAGNOSIS — Z87891 Personal history of nicotine dependence: Secondary | ICD-10-CM | POA: Insufficient documentation

## 2024-04-02 DIAGNOSIS — Z7951 Long term (current) use of inhaled steroids: Secondary | ICD-10-CM | POA: Insufficient documentation

## 2024-04-02 DIAGNOSIS — Z85118 Personal history of other malignant neoplasm of bronchus and lung: Secondary | ICD-10-CM | POA: Insufficient documentation

## 2024-04-02 DIAGNOSIS — I10 Essential (primary) hypertension: Secondary | ICD-10-CM | POA: Insufficient documentation

## 2024-04-02 DIAGNOSIS — R0600 Dyspnea, unspecified: Secondary | ICD-10-CM | POA: Diagnosis present

## 2024-04-02 DIAGNOSIS — R262 Difficulty in walking, not elsewhere classified: Secondary | ICD-10-CM | POA: Diagnosis not present

## 2024-04-02 DIAGNOSIS — J9621 Acute and chronic respiratory failure with hypoxia: Secondary | ICD-10-CM | POA: Diagnosis not present

## 2024-04-02 DIAGNOSIS — Z515 Encounter for palliative care: Secondary | ICD-10-CM

## 2024-04-02 DIAGNOSIS — C3491 Malignant neoplasm of unspecified part of right bronchus or lung: Secondary | ICD-10-CM | POA: Diagnosis present

## 2024-04-02 DIAGNOSIS — F17200 Nicotine dependence, unspecified, uncomplicated: Secondary | ICD-10-CM | POA: Diagnosis present

## 2024-04-02 DIAGNOSIS — F419 Anxiety disorder, unspecified: Secondary | ICD-10-CM | POA: Diagnosis not present

## 2024-04-02 DIAGNOSIS — J9620 Acute and chronic respiratory failure, unspecified whether with hypoxia or hypercapnia: Secondary | ICD-10-CM | POA: Diagnosis present

## 2024-04-02 DIAGNOSIS — Z66 Do not resuscitate: Secondary | ICD-10-CM | POA: Insufficient documentation

## 2024-04-02 LAB — CBC WITH DIFFERENTIAL/PLATELET
Abs Immature Granulocytes: 0.01 10*3/uL (ref 0.00–0.07)
Basophils Absolute: 0 10*3/uL (ref 0.0–0.1)
Basophils Relative: 1 %
Eosinophils Absolute: 0.1 10*3/uL (ref 0.0–0.5)
Eosinophils Relative: 2 %
HCT: 42.7 % (ref 39.0–52.0)
Hemoglobin: 13.4 g/dL (ref 13.0–17.0)
Immature Granulocytes: 0 %
Lymphocytes Relative: 32 %
Lymphs Abs: 1.2 10*3/uL (ref 0.7–4.0)
MCH: 30.3 pg (ref 26.0–34.0)
MCHC: 31.4 g/dL (ref 30.0–36.0)
MCV: 96.6 fL (ref 80.0–100.0)
Monocytes Absolute: 0.7 10*3/uL (ref 0.1–1.0)
Monocytes Relative: 19 %
Neutro Abs: 1.8 10*3/uL (ref 1.7–7.7)
Neutrophils Relative %: 46 %
Platelets: 173 10*3/uL (ref 150–400)
RBC: 4.42 MIL/uL (ref 4.22–5.81)
RDW: 12 % (ref 11.5–15.5)
WBC: 3.8 10*3/uL — ABNORMAL LOW (ref 4.0–10.5)
nRBC: 0 % (ref 0.0–0.2)

## 2024-04-02 LAB — COMPREHENSIVE METABOLIC PANEL WITH GFR
ALT: 13 U/L (ref 0–44)
AST: 15 U/L (ref 15–41)
Albumin: 3.6 g/dL (ref 3.5–5.0)
Alkaline Phosphatase: 61 U/L (ref 38–126)
Anion gap: 6 (ref 5–15)
BUN: 16 mg/dL (ref 8–23)
CO2: 40 mmol/L — ABNORMAL HIGH (ref 22–32)
Calcium: 9.1 mg/dL (ref 8.9–10.3)
Chloride: 89 mmol/L — ABNORMAL LOW (ref 98–111)
Creatinine, Ser: 0.82 mg/dL (ref 0.61–1.24)
GFR, Estimated: >60 mL/min (ref >60)
Glucose, Bld: 101 mg/dL — ABNORMAL HIGH (ref 70–99)
Potassium: 3.8 mmol/L (ref 3.5–5.1)
Sodium: 135 mmol/L (ref 135–145)
Total Bilirubin: 1.1 mg/dL (ref 0.0–1.2)
Total Protein: 7.3 g/dL (ref 6.5–8.1)

## 2024-04-02 LAB — TROPONIN I (HIGH SENSITIVITY): Troponin I (High Sensitivity): 6 ng/L (ref <18)

## 2024-04-02 LAB — MAGNESIUM: Magnesium: 1.8 mg/dL (ref 1.7–2.4)

## 2024-04-02 LAB — RESP PANEL BY RT-PCR (RSV, FLU A&B, COVID)  RVPGX2
Influenza A by PCR: NEGATIVE
Influenza B by PCR: NEGATIVE
Resp Syncytial Virus by PCR: NEGATIVE
SARS Coronavirus 2 by RT PCR: NEGATIVE

## 2024-04-02 LAB — MRSA NEXT GEN BY PCR, NASAL: MRSA by PCR Next Gen: NOT DETECTED

## 2024-04-02 LAB — PROCALCITONIN: Procalcitonin: <0.1 ng/mL

## 2024-04-02 LAB — BRAIN NATRIURETIC PEPTIDE: B Natriuretic Peptide: 11 pg/mL (ref 0.0–100.0)

## 2024-04-02 LAB — LACTIC ACID, PLASMA: Lactic Acid, Venous: 1 mmol/L (ref 0.5–1.9)

## 2024-04-02 LAB — PHOSPHORUS: Phosphorus: 3.5 mg/dL (ref 2.5–4.6)

## 2024-04-02 MED ORDER — ACETAMINOPHEN 325 MG PO TABS
650.0000 mg | ORAL_TABLET | Freq: Four times a day (QID) | ORAL | Status: DC | PRN
Start: 2024-04-02 — End: 2024-04-03
  Administered 2024-04-02: 650 mg via ORAL
  Filled 2024-04-02: qty 2

## 2024-04-02 MED ORDER — SODIUM CHLORIDE 0.9% FLUSH: 3.0000 mL | Freq: Two times a day (BID) | INTRAVENOUS | Status: DC

## 2024-04-02 MED ORDER — SENNOSIDES-DOCUSATE SODIUM 8.6-50 MG PO TABS
1.0000 | ORAL_TABLET | Freq: Every evening | ORAL | Status: DC | PRN
Start: 2024-04-02 — End: 2024-04-03

## 2024-04-02 MED ORDER — FLORANEX PO PACK
1.0000 g | PACK | Freq: Three times a day (TID) | ORAL | Status: DC
  Administered 2024-04-02 – 2024-04-03 (×2): 1 g via ORAL
  Filled 2024-04-02 (×6): qty 1

## 2024-04-02 MED ORDER — ZOLPIDEM TARTRATE 5 MG PO TABS
5.0000 mg | ORAL_TABLET | Freq: Every evening | ORAL | Status: DC | PRN
Start: 2024-04-02 — End: 2024-04-03

## 2024-04-02 MED ORDER — CHLORHEXIDINE GLUCONATE CLOTH 2 % EX PADS
6.0000 | MEDICATED_PAD | Freq: Every day | CUTANEOUS | Status: DC
  Administered 2024-04-03: 6 via TOPICAL

## 2024-04-02 MED ORDER — FLEET ENEMA RE ENEM: 1.0000 | ENEMA | Freq: Once | RECTAL | Status: DC | PRN

## 2024-04-02 MED ORDER — IPRATROPIUM-ALBUTEROL 0.5-2.5 (3) MG/3ML IN SOLN
3.0000 mL | Freq: Once | RESPIRATORY_TRACT | Status: AC
  Administered 2024-04-02: 3 mL via RESPIRATORY_TRACT
  Filled 2024-04-02: qty 3

## 2024-04-02 MED ORDER — SODIUM CHLORIDE 0.9 % IV SOLN
500.0000 mg | Freq: Once | INTRAVENOUS | Status: AC
  Administered 2024-04-02: 500 mg via INTRAVENOUS
  Filled 2024-04-02: qty 5

## 2024-04-02 MED ORDER — ONDANSETRON HCL 4 MG/2ML IJ SOLN: 4.0000 mg | Freq: Four times a day (QID) | INTRAMUSCULAR | Status: DC | PRN

## 2024-04-02 MED ORDER — OXYCODONE HCL 5 MG PO TABS
15.0000 mg | ORAL_TABLET | Freq: Once | ORAL | Status: AC
  Administered 2024-04-02: 15 mg via ORAL
  Filled 2024-04-02: qty 3

## 2024-04-02 MED ORDER — NICOTINE 21 MG/24HR TD PT24
21.0000 mg | MEDICATED_PATCH | Freq: Every day | TRANSDERMAL | Status: DC
  Administered 2024-04-03: 21 mg via TRANSDERMAL
  Filled 2024-04-02: qty 1

## 2024-04-02 MED ORDER — AZITHROMYCIN 250 MG PO TABS
500.0000 mg | ORAL_TABLET | Freq: Every day | ORAL | Status: DC
  Administered 2024-04-03: 500 mg via ORAL
  Filled 2024-04-02: qty 2

## 2024-04-02 MED ORDER — CLONAZEPAM 0.5 MG PO TABS
1.0000 mg | ORAL_TABLET | Freq: Two times a day (BID) | ORAL | Status: DC
  Administered 2024-04-02 – 2024-04-03 (×2): 1 mg via ORAL
  Filled 2024-04-02 (×2): qty 2

## 2024-04-02 MED ORDER — HYDROMORPHONE HCL 1 MG/ML IJ SOLN: 0.5000 mg | INTRAMUSCULAR | Status: DC | PRN

## 2024-04-02 MED ORDER — OXYCODONE HCL 5 MG PO TABS: 5.0000 mg | ORAL_TABLET | ORAL | Status: DC | PRN

## 2024-04-02 MED ORDER — BISACODYL 5 MG PO TBEC: 5.0000 mg | DELAYED_RELEASE_TABLET | Freq: Every day | ORAL | Status: DC | PRN

## 2024-04-02 MED ORDER — PANTOPRAZOLE SODIUM 40 MG PO TBEC
40.0000 mg | DELAYED_RELEASE_TABLET | Freq: Every day | ORAL | Status: DC
  Administered 2024-04-03: 40 mg via ORAL
  Filled 2024-04-02: qty 1

## 2024-04-02 MED ORDER — METHYLPREDNISOLONE SODIUM SUCC 125 MG IJ SOLR
80.0000 mg | Freq: Two times a day (BID) | INTRAMUSCULAR | Status: DC
  Administered 2024-04-02: 80 mg via INTRAVENOUS
  Filled 2024-04-02: qty 2

## 2024-04-02 MED ORDER — ONDANSETRON HCL 4 MG PO TABS: 4.0000 mg | ORAL_TABLET | Freq: Four times a day (QID) | ORAL | Status: DC | PRN

## 2024-04-02 MED ORDER — HEPARIN SODIUM (PORCINE) 5000 UNIT/ML IJ SOLN
5000.0000 [IU] | Freq: Three times a day (TID) | INTRAMUSCULAR | Status: DC
  Administered 2024-04-02 – 2024-04-03 (×3): 5000 [IU] via SUBCUTANEOUS
  Filled 2024-04-02 (×3): qty 1

## 2024-04-02 MED ORDER — OXYCODONE HCL 5 MG PO TABS
15.0000 mg | ORAL_TABLET | ORAL | Status: DC | PRN
  Administered 2024-04-02: 15 mg via ORAL
  Filled 2024-04-02: qty 3

## 2024-04-02 MED ORDER — LEVALBUTEROL HCL 0.63 MG/3ML IN NEBU
0.6300 mg | INHALATION_SOLUTION | Freq: Four times a day (QID) | RESPIRATORY_TRACT | Status: DC | PRN
  Administered 2024-04-03: 0.63 mg via RESPIRATORY_TRACT
  Filled 2024-04-02: qty 3

## 2024-04-02 MED ORDER — METHYLPREDNISOLONE SODIUM SUCC 125 MG IJ SOLR
125.0000 mg | Freq: Once | INTRAMUSCULAR | Status: AC
  Administered 2024-04-02: 125 mg via INTRAVENOUS
  Filled 2024-04-02: qty 2

## 2024-04-02 MED ORDER — ALBUTEROL SULFATE (2.5 MG/3ML) 0.083% IN NEBU
2.5000 mg | INHALATION_SOLUTION | Freq: Once | RESPIRATORY_TRACT | Status: AC
  Administered 2024-04-02: 2.5 mg via RESPIRATORY_TRACT
  Filled 2024-04-02: qty 3

## 2024-04-02 MED ORDER — HYDRALAZINE HCL 20 MG/ML IJ SOLN: 10.0000 mg | INTRAMUSCULAR | Status: DC | PRN

## 2024-04-02 MED ORDER — SODIUM CHLORIDE 0.9% FLUSH: 3.0000 mL | INTRAVENOUS | Status: DC | PRN

## 2024-04-02 MED ORDER — SODIUM CHLORIDE 0.9% FLUSH
3.0000 mL | Freq: Two times a day (BID) | INTRAVENOUS | Status: DC
  Administered 2024-04-02 – 2024-04-03 (×3): 3 mL via INTRAVENOUS

## 2024-04-02 MED ORDER — ACETAMINOPHEN 650 MG RE SUPP: 650.0000 mg | Freq: Four times a day (QID) | RECTAL | Status: DC | PRN

## 2024-04-02 MED ORDER — SODIUM CHLORIDE 0.9 % IV SOLN
1.0000 g | Freq: Once | INTRAVENOUS | Status: AC
  Administered 2024-04-02: 1 g via INTRAVENOUS
  Filled 2024-04-02: qty 10

## 2024-04-02 MED ORDER — SODIUM CHLORIDE 0.9 % IV SOLN
1.0000 g | INTRAVENOUS | Status: DC
  Administered 2024-04-03: 1 g via INTRAVENOUS
  Filled 2024-04-02: qty 10

## 2024-04-02 MED ORDER — IPRATROPIUM BROMIDE 0.02 % IN SOLN: 0.5000 mg | Freq: Four times a day (QID) | RESPIRATORY_TRACT | Status: DC | PRN

## 2024-04-02 MED ORDER — FUROSEMIDE 20 MG PO TABS: 20.0000 mg | ORAL_TABLET | Freq: Every day | ORAL | Status: DC | PRN

## 2024-04-02 NOTE — Assessment & Plan Note (Signed)
-   Due to acute alcohol  respiratory failure, COPD exacerbation Management as above

## 2024-04-02 NOTE — Assessment & Plan Note (Signed)
-   Records reviewed-patient

## 2024-04-02 NOTE — ED Provider Notes (Signed)
 [place] EMERGENCY DEPARTMENT AT [facility] Provider Note   CSN: [id number] Arrival date & time: 04/02/24  0827     History  Chief Complaint  Patient presents with   Shortness of Breath    Miguel Marsh is a 63 y.o. male.  HPI 63 year old male with a history of COPD, hypertension, cocaine abuse, non-small cell lung cancer and other comorbidities on chronic 3 L of oxygen  presents with shortness of breath.  Symptoms started 3 days ago.  A couple days ago he had some sharp chest pain that is no longer present.  Last week his legs were swollen but no longer.  He denies fever or sore throat.  He's had a mildly productive cough for about 1 week.  He does still occasionally smoke.  Home Medications Prior to Admission medications   Medication Sig Start Date End Date Taking? Authorizing Provider  acetaminophen  (TYLENOL ) 325 MG tablet Take 2 tablets (650 mg total) by mouth every 6 (six) hours as needed for mild pain (pain score 1-3) (or Fever >/= 101). 01/31/24   Colin Dawley, MD  albuterol  (PROVENTIL ) (2.5 MG/3ML) 0.083% nebulizer solution Inhale 3 mLs (2.5 mg total) into the lungs every 6 (six) hours as needed for wheezing or shortness of breath. 01/31/24   Colin Dawley, MD  albuterol  (VENTOLIN  HFA) 108 (90 Base) MCG/ACT inhaler Inhale 2 puffs into the lungs every 6 (six) hours as needed for shortness of breath or wheezing (Cough). 01/31/24   Colin Dawley, MD  BREZTRI  AEROSPHERE 160-9-4.8 MCG/ACT AERO Inhale 2 puffs into the lungs 2 (two) times daily. 01/31/24   Colin Dawley, MD  clonazePAM  (KLONOPIN ) 1 MG tablet Take 1 tablet (1 mg total) by mouth 2 (two) times daily as needed for anxiety. 11/01/23   Colin Dawley, MD  clonazePAM  (KLONOPIN ) 1 MG tablet Take 1 mg by mouth 2 (two) times daily. 03/21/24   [provider]  furosemide  (LASIX ) 40 MG tablet Take 0.5 tablets (20 mg total) by mouth See admin instructions. Take once a day  as needed for fluid/swelling  11/01/23   Colin Dawley, MD  guaiFENesin  (MUCINEX ) 600 MG 12 hr tablet Take 1 tablet (600 mg total) by mouth 2 (two) times daily. 01/31/24 01/30/25  Colin Dawley, MD  hydrochlorothiazide  (HYDRODIURIL ) 25 MG tablet Take 25 mg by mouth every morning.    [provider]  nicotine  (NICODERM CQ  - DOSED IN MG/24 HOURS) 21 mg/24hr patch Place 21 mg onto the skin daily. 02/06/24   [provider]  omeprazole  (PRILOSEC) 40 MG capsule Take 1 capsule (40 mg total) by mouth in the morning and at bedtime. 03/06/24   Justina Oman, MD  oxyCODONE  (ROXICODONE ) 15 MG immediate release tablet Take 15 mg by mouth every 4 (four) hours as needed for pain.    [provider]  OXYGEN  Inhale 3 L into the lungs continuous.     [provider]  predniSONE  (DELTASONE ) 20 MG tablet Take 3 tablets by mouth daily x 1 day; then 2 tablets by mouth daily x 2 days; then 1 tablet by mouth daily x 3 days; then half tablet by mouth daily x 3 days and stop prednisone . 03/07/24   Justina Oman, MD  traZODone  (DESYREL ) 50 MG tablet Take 1 tablet (50 mg total) by mouth at bedtime as needed for sleep. 01/31/24   Colin Dawley, MD      Allergies    Ibuprofen     Review of Systems   Review of Systems  Constitutional:  Negative for fever.  Respiratory:  Positive for shortness of breath.   Cardiovascular:  Negative for chest pain and leg swelling.    Physical Exam Updated Vital Signs BP 133/76   Pulse 75   Temp 97.6 F (36.4 C) (Oral)   Resp 16   Ht 5\' 6"  (1.676 m)   Wt 88 kg   SpO2 92%   BMI 31.31 kg/m  Physical Exam Vitals and nursing note reviewed.  Constitutional:      General: He is not in acute distress.    Appearance: He is well-developed. He is not ill-appearing or diaphoretic.  HENT:     Head: Normocephalic and atraumatic.  Cardiovascular:     Rate and Rhythm: Normal rate and regular rhythm.     Heart sounds: Normal heart sounds.  Pulmonary:     Effort: Pulmonary effort is  normal. No accessory muscle usage.     Breath sounds: Decreased breath sounds and wheezing present.  Abdominal:     Palpations: Abdomen is soft.     Tenderness: There is no abdominal tenderness.  Musculoskeletal:     Right lower leg: No edema.     Left lower leg: No edema.  Skin:    General: Skin is warm and dry.  Neurological:     Mental Status: He is alert.     ED Results / Procedures / Treatments   Labs (all labs ordered are listed, but only abnormal results are displayed) Labs Reviewed  CBC WITH DIFFERENTIAL/PLATELET - Abnormal; Notable for the following components:      Result Value   WBC 3.8 (*)    All other components within normal limits  COMPREHENSIVE METABOLIC PANEL WITH GFR - Abnormal; Notable for the following components:   Chloride 89 (*)    CO2 40 (*)    Glucose, Bld 101 (*)    All other components within normal limits  RESP PANEL BY RT-PCR (RSV, FLU A&B, COVID)  RVPGX2  CULTURE, BLOOD (ROUTINE X 2)  CULTURE, BLOOD (ROUTINE X 2)  EXPECTORATED SPUTUM ASSESSMENT W GRAM STAIN, RFLX TO RESP C  MRSA NEXT GEN BY PCR, NASAL  BRAIN NATRIURETIC PEPTIDE  MAGNESIUM   PHOSPHORUS  PROCALCITONIN  LACTIC ACID, PLASMA  TROPONIN I (HIGH SENSITIVITY)    EKG EKG Interpretation Date/Time:  Wednesday April 02 2024 08:44:06 EDT Ventricular Rate:  78 PR Interval:    QRS Duration:  82 QT Interval:  356 QTC Calculation: 403 R Axis:   43  Text Interpretation: Normal sinus rhythm  non significant change since Mar 2025 Artifact in lead(s) I II aVR aVL aVF V5 Confirmed by Jerilynn Montenegro [phone]) on 04/02/2024 8:58:29 AM  Radiology DG Chest Portable 1 View Result Date: 04/02/2024 CLINICAL DATA:  Dyspnea EXAM: PORTABLE CHEST 1 VIEW COMPARISON:  Mar 04, 2024 FINDINGS: Minimal residual left lower lobe infiltrates and atelectasis without significant consolidations Heart and mediastinum normal No pleural effusions or pneumothorax IMPRESSION: Minimal residual left lower lobe infiltrates  and atelectasis. Electronically Signed   By: Fredrich Jefferson M.D.   On: 04/02/2024 09:14    Procedures Procedures    Medications Ordered in ED Medications  heparin  injection 5,000 Units (5,000 Units Subcutaneous Given 04/02/24 1449)  sodium chloride  flush (NS) 0.9 % injection 3 mL (3 mLs Intravenous Given 04/02/24 1449)  acetaminophen  (TYLENOL ) tablet 650 mg (has no administration in time range)    Or  acetaminophen  (TYLENOL ) suppository 650 mg (has no administration in time range)  HYDROmorphone  (DILAUDID ) injection 0.5-1 mg (  has no administration in time range)  zolpidem  (AMBIEN ) tablet 5 mg (has no administration in time range)  senna-docusate (Senokot-S) tablet 1 tablet (has no administration in time range)  bisacodyl  (DULCOLAX) EC tablet 5 mg (has no administration in time range)  sodium phosphate  (FLEET) enema 1 enema (has no administration in time range)  ondansetron  (ZOFRAN ) tablet 4 mg (has no administration in time range)    Or  ondansetron  (ZOFRAN ) injection 4 mg (has no administration in time range)  ipratropium (ATROVENT ) nebulizer solution 0.5 mg (has no administration in time range)  levalbuterol (XOPENEX) nebulizer solution 0.63 mg (has no administration in time range)  hydrALAZINE  (APRESOLINE ) injection 10 mg (has no administration in time range)  sodium chloride  flush (NS) 0.9 % injection 3-10 mL (has no administration in time range)  lactobacillus (FLORANEX/LACTINEX) granules 1 g (has no administration in time range)  oxyCODONE  (Oxy IR/ROXICODONE ) immediate release tablet 15 mg (has no administration in time range)  furosemide  (LASIX ) tablet 20 mg (has no administration in time range)  nicotine  (NICODERM CQ  - dosed in mg/24 hours) patch 21 mg (21 mg Transdermal Patient Refused/Not Given 04/02/24 1441)  pantoprazole  (PROTONIX ) EC tablet 40 mg (40 mg Oral Patient Refused/Not Given 04/02/24 1402)  clonazePAM  (KLONOPIN ) tablet 1 mg (1 mg Oral Not Given 04/02/24 1449)   methylPREDNISolone  sodium succinate (SOLU-MEDROL ) 125 mg/2 mL injection 80 mg (has no administration in time range)  Chlorhexidine  Gluconate Cloth 2 % PADS 6 each (has no administration in time range)  methylPREDNISolone  sodium succinate (SOLU-MEDROL ) 125 mg/2 mL injection 125 mg (125 mg Intravenous Given 04/02/24 0911)  ipratropium-albuterol  (DUONEB) 0.5-2.5 (3) MG/3ML nebulizer solution 3 mL (3 mLs Nebulization Given 04/02/24 0903)  albuterol  (PROVENTIL ) (2.5 MG/3ML) 0.083% nebulizer solution 2.5 mg (2.5 mg Nebulization Given 04/02/24 0903)  albuterol  (PROVENTIL ) (2.5 MG/3ML) 0.083% nebulizer solution 2.5 mg (2.5 mg Nebulization Given 04/02/24 1055)  ipratropium-albuterol  (DUONEB) 0.5-2.5 (3) MG/3ML nebulizer solution 3 mL (3 mLs Nebulization Given 04/02/24 1055)  cefTRIAXone  (ROCEPHIN ) 1 g in sodium chloride  0.9 % 100 mL IVPB (0 g Intravenous Stopped 04/02/24 1100)  azithromycin  (ZITHROMAX ) 500 mg in sodium chloride  0.9 % 250 mL IVPB (0 mg Intravenous Stopped 04/02/24 1414)  oxyCODONE  (Oxy IR/ROXICODONE ) immediate release tablet 15 mg (15 mg Oral Given 04/02/24 1101)    ED Course/ Medical Decision Making/ A&P                                 Medical Decision Making Amount and/or Complexity of Data Reviewed Labs: ordered.    Details: Leukopenia Radiology: ordered and independent interpretation performed.    Details: Left lower lobe infiltrate, probably pneumonia. ECG/medicine tests: ordered and independent interpretation performed.    Details: No ischemia  Risk Prescription drug management. Decision regarding hospitalization.   Patient presents with recurrent COPD exacerbation.  Has diminished breath sounds and wheezing on exam.  This is improving but he is still pretty symptomatic and now requiring 4 L instead of his normal 3 L after a breathing treatment.  Was given Solu-Medrol .  His x-ray is concerning for developing infiltrate and with his new cough, will treat as pneumonia.  Was given IV  antibiotics.  Discussed with Dr. Eilene Grater for admission.        Final Clinical Impression(s) / ED Diagnoses Final diagnoses:  COPD exacerbation (HCC)  Community acquired pneumonia of left lower lobe of lung    Rx / DC Orders ED Discharge  Orders     None         Jerilynn Montenegro, MD 04/02/24 [phone]

## 2024-04-02 NOTE — Assessment & Plan Note (Signed)
-   Acute on chronic hypoxic respiratory failure -Baseline O2 demand 3 L, currently requiring 4 L, satting 97% -Diffuse wheezing and rhonchi, continue with DuoNeb bronchodilators, IV steroids, empiric IV antibiotics -Chest x-ray atelectasis versus left lower lobe infiltrate -Will try to obtain sputum cultures, blood cultures and follow-up

## 2024-04-02 NOTE — H&P (Signed)
 History and Physical   Patient: Miguel Marsh                            PCP: Lenn Quint, NP                    DOB: [birth year]            DOA: 04/02/2024 JYN:[id number]             DOS: 04/02/2024, 4:15 PM  Lenn Quint, NP  Patient coming from:   HOME  I have personally reviewed patient's medical records, in electronic medical records, including:  [facility], and care everywhere.    Chief Complaint:   Chief Complaint  Patient presents with   Shortness of Breath    History of present illness:   Miguel Marsh is a 63 year old male with history of COPD, chronic respiratory failure on 3 L of oxygen  at baseline, HTN, substance abuse cocaine, nonsmall cell lung cancer, anxiety, hypertension.  Presented to ED with chief complaint of progressive shortness of breath for past 3 days.  Had a transient chest pain few days ago which has subsided.  Has been progressively getting weak denies any fevers, denies any chills.  Mildly productive cough for past 2 weeks.  History of chronic tobacco abuse.  Reports he still smokes few cigarettes a day.    ED Evaluation: Upon arrival was requiring 4 L of oxygen , 88% on room air, currently 92% on room 3 L. Was tachypneic with respiratory rate of 25. Blood pressure 133/76, pulse 75, temperature 97.6 F (36.4 C), temperature source Oral, resp. rate 16, height 5\' 6"  (1.676 m), weight 88 kg, SpO2 92%.  Labs CBC WC 3.8, chloride 89, CO2 40, glucose 101, lactic acid 1.0, magnesium  1.8, troponin 6, BNP 11.0, procalcitonin <0.10. Respiratory viral panel negative Chest x-ray:IMPRESSION: Minimal residual left lower lobe infiltrates and atelectasis  It was requested for patient to be admitted for COPD exacerbation and possible pneumonia    Patient Denies having: Fever, Chills, Chest Pain, Abd pain, N/V/D, headache, dizziness, lightheadedness,  Dysuria, Joint pain, rash, open wounds     Review of Systems: As per HPI, otherwise 10  point review of systems were negative.   ----------------------------------------------------------------------------------------------------------------------  Allergies  Allergen Reactions   Ibuprofen  Shortness Of Breath    Home MEDs:  Prior to Admission medications   Medication Sig Start Date End Date Taking? Authorizing Provider  clonazePAM  (KLONOPIN ) 1 MG tablet Take 1 mg by mouth 2 (two) times daily. 03/21/24  Yes [provider]  acetaminophen  (TYLENOL ) 325 MG tablet Take 2 tablets (650 mg total) by mouth every 6 (six) hours as needed for mild pain (pain score 1-3) (or Fever >/= 101). 01/31/24   Colin Dawley, MD  albuterol  (PROVENTIL ) (2.5 MG/3ML) 0.083% nebulizer solution Inhale 3 mLs (2.5 mg total) into the lungs every 6 (six) hours as needed for wheezing or shortness of breath. 01/31/24   Colin Dawley, MD  albuterol  (VENTOLIN  HFA) 108 (90 Base) MCG/ACT inhaler Inhale 2 puffs into the lungs every 6 (six) hours as needed for shortness of breath or wheezing (Cough). 01/31/24   Colin Dawley, MD  BREZTRI  AEROSPHERE 160-9-4.8 MCG/ACT AERO Inhale 2 puffs into the lungs 2 (two) times daily. 01/31/24   Colin Dawley, MD  clonazePAM  (KLONOPIN ) 1 MG tablet Take 1 tablet (1 mg total) by mouth 2 (two) times daily as needed for anxiety. 11/01/23  Colin Dawley, MD  furosemide  (LASIX ) 40 MG tablet Take 0.5 tablets (20 mg total) by mouth See admin instructions. Take once a day  as needed for fluid/swelling 11/01/23   Colin Dawley, MD  guaiFENesin  (MUCINEX ) 600 MG 12 hr tablet Take 1 tablet (600 mg total) by mouth 2 (two) times daily. 01/31/24 01/30/25  Colin Dawley, MD  hydrochlorothiazide  (HYDRODIURIL ) 25 MG tablet Take 25 mg by mouth every morning.    [provider]  nicotine  (NICODERM CQ  - DOSED IN MG/24 HOURS) 21 mg/24hr patch Place 21 mg onto the skin daily. 02/06/24   [provider]  omeprazole  (PRILOSEC) 40 MG capsule Take 1 capsule (40 mg total) by mouth  in the morning and at bedtime. 03/06/24   Justina Oman, MD  oxyCODONE  (ROXICODONE ) 15 MG immediate release tablet Take 15 mg by mouth every 4 (four) hours as needed for pain.    [provider]  OXYGEN  Inhale 3 L into the lungs continuous.     [provider]  predniSONE  (DELTASONE ) 20 MG tablet Take 3 tablets by mouth daily x 1 day; then 2 tablets by mouth daily x 2 days; then 1 tablet by mouth daily x 3 days; then half tablet by mouth daily x 3 days and stop prednisone . 03/07/24   Justina Oman, MD  traZODone  (DESYREL ) 50 MG tablet Take 1 tablet (50 mg total) by mouth at bedtime as needed for sleep. 01/31/24   Colin Dawley, MD    PRN MEDs: acetaminophen  **OR** acetaminophen , bisacodyl , hydrALAZINE , HYDROmorphone  (DILAUDID ) injection, ipratropium, levalbuterol, ondansetron  **OR** ondansetron  (ZOFRAN ) IV, oxyCODONE , senna-docusate, sodium chloride  flush, sodium phosphate , zolpidem   Past Medical History:  Diagnosis Date   Arthritis    Bronchitis    Cancer (HCC)    metastatic NSCLC (followed by [facility])   COPD (chronic obstructive pulmonary disease) (HCC)    HTN (hypertension)     Past Surgical History:  Procedure Laterality Date   LUNG BIOPSY     TOTAL HIP ARTHROPLASTY     TUMOR REMOVAL Right 12/04/2017     reports that he has quit smoking. His smoking use included cigarettes. He has never used smokeless tobacco. He reports current alcohol  use. He reports current drug use. Drugs: Marijuana and Cocaine.   History reviewed. No pertinent family history.  Physical Exam:   Vitals:   04/02/24 1400 04/02/24 1413 04/02/24 1441 04/02/24 1500  BP: 128/74  112/74 133/76  Pulse: 73  79 75  Resp: 12  10 16   Temp:  97.9 F (36.6 C)  97.6 F (36.4 C)  TempSrc:  Axillary  Oral  SpO2: 97%  (!) 88% 92%  Weight:    88 kg  Height:    5\' 6"  (1.676 m)   Constitutional: NAD, calm, comfortable Eyes: PERRL, lids and conjunctivae normal ENMT: Mucous membranes are moist. Posterior  pharynx clear of any exudate or lesions.Normal dentition.  Neck: normal, supple, no masses, no thyromegaly Respiratory: Mildly labored breathing, requiring 4 L of oxygen , satting 97%, diffuse wheezing and rhonchi, no crackles at lower lobes Cardiovascular: Regular rate and rhythm, no murmurs / rubs / gallops. No extremity edema. 2+ pedal pulses. No carotid bruits.  Abdomen: no tenderness, no masses palpated. No hepatosplenomegaly. Bowel sounds positive.  Musculoskeletal: Severe generalized weaknesses,  no clubbing / cyanosis. No joint deformity upper and lower extremities. Good ROM, no contractures. Normal muscle tone.  Neurologic: CN II-XII grossly intact. Sensation intact, DTR normal. Strength 5/5 in all 4.  Psychiatric: Normal judgment and insight.  Alert and oriented x 3. Normal mood.  Skin: no rashes, lesions, ulcers. No induration        Labs on admission:    I have personally reviewed following labs and imaging studies  CBC: Recent Labs  Lab 04/02/24 0847  WBC 3.8*  NEUTROABS 1.8  HGB 13.4  HCT 42.7  MCV 96.6  PLT 173   Basic Metabolic Panel: Recent Labs  Lab 04/02/24 0847  NA 135  K 3.8  CL 89*  CO2 40*  GLUCOSE 101*  BUN 16  CREATININE 0.82  CALCIUM 9.1  MG 1.8  PHOS 3.5   GFR: Estimated Creatinine Clearance: 97.1 mL/min (by C-G formula based on SCr of 0.82 mg/dL). Liver Function Tests: Recent Labs  Lab 04/02/24 0847  AST 15  ALT 13  ALKPHOS 61  BILITOT 1.1  PROT 7.3  ALBUMIN 3.6    Urine analysis:    Component Value Date/Time   COLORURINE YELLOW 03/06/2023 1130   APPEARANCEUR HAZY (A) 03/06/2023 1130   LABSPEC 1.023 03/06/2023 1130   PHURINE 5.0 03/06/2023 1130   GLUCOSEU 50 (A) 03/06/2023 1130   HGBUR NEGATIVE 03/06/2023 1130   BILIRUBINUR NEGATIVE 03/06/2023 1130   KETONESUR NEGATIVE 03/06/2023 1130   PROTEINUR 100 (A) 03/06/2023 1130   UROBILINOGEN 0.2 10/21/2013 0200   NITRITE NEGATIVE 03/06/2023 1130   LEUKOCYTESUR NEGATIVE  03/06/2023 1130    Last A1C:  Lab Results  Component Value Date   HGBA1C 5.6 06/26/2023     Radiologic Exams on Admission:   DG Chest Portable 1 View Result Date: 04/02/2024 CLINICAL DATA:  Dyspnea EXAM: PORTABLE CHEST 1 VIEW COMPARISON:  Mar 04, 2024 FINDINGS: Minimal residual left lower lobe infiltrates and atelectasis without significant consolidations Heart and mediastinum normal No pleural effusions or pneumothorax IMPRESSION: Minimal residual left lower lobe infiltrates and atelectasis. Electronically Signed   By: Fredrich Jefferson M.D.   On: 04/02/2024 09:14    EKG:   Independently reviewed.  Orders placed or performed during the hospital encounter of 04/02/24   EKG 12-Lead   EKG 12-Lead   ED EKG   ED EKG   EKG 12-Lead   ---------------------------------------------------------------------------------------------------------------------------------------    Assessment / Plan:   Principal Problem:   Acute on chronic respiratory failure (HCC) Active Problems:   Tobacco use disorder   CAP (community acquired pneumonia)   Difficulty walking   Dyspnea   Non-small cell cancer of right lung (in remission)   GERD (gastroesophageal reflux disease)   Acute exacerbation of chronic obstructive pulmonary disease (COPD) (HCC)   Palliative care by specialist   Anxiety   Assessment and Plan: * Acute on chronic respiratory failure (HCC) - Acute on chronic hypoxic respiratory failure -Baseline O2 demand 3 L, currently requiring 4 L, satting 97% -Diffuse wheezing and rhonchi, continue with DuoNeb bronchodilators, IV steroids, empiric IV antibiotics -Chest x-ray atelectasis versus left lower lobe infiltrate -Will try to obtain sputum cultures, blood cultures and follow-up  Tobacco use disorder Discussed with patient regarding smoking cessation NicoDerm patch provided  DNR (do not resuscitate) discussion - Records reviewed-patient  Palliative care by specialist On records  patient has DNR/DNI status -Consulted palliative care for determining goals of care  Acute exacerbation of chronic obstructive pulmonary disease (COPD) (HCC) Baseline O2 demand 3 L Currently requiring 4 L of oxygen  satting 97% Maintaining O2 sat 88-92% Nebs, steroids and empiric antibiotics as above  GERD (gastroesophageal reflux disease) - Continue PPI  Non-small cell cancer of right lung (in remission) - History  of non-small cancer -In remission Not on any treatment  Dyspnea - Due to acute alcohol  respiratory failure, COPD exacerbation Management as above  Difficulty walking - Consulting PT/OT for evaluation recommendations       Consults called: PT/OT/TOC/palliative care team -------------------------------------------------------------------------------------------------------------------------------------------- DVT prophylaxis:  heparin  injection 5,000 Units Start: 04/02/24 1400 TED hose Start: 04/02/24 1108 SCDs Start: 04/02/24 1108   Code Status:   Code Status: Full Code   Admission status: Patient will be admitted as Inpatient, with a greater than 2 midnight length of stay. Level of care: Med-Surg   Family Communication:  none at bedside  (The above findings and plan of care has been discussed with patient in detail, the patient expressed understanding and agreement of above plan)  --------------------------------------------------------------------------------------------------------------------------------------------------  Disposition Plan:  Anticipated 1-2 days Status is: Inpatient Remains inpatient appropriate because: Needing IV antibiotics, IV steroids, nebs, respiratory support     ----------------------------------------------------------------------------------------------------------------------------------------------------  Time spent:  64  Min.  Was spent seeing and evaluating the patient, reviewing all medical records, drawn plan of  care.  SIGNED: Bobbetta Burnet, MD, FHM. FAAFP. [place] [facility], Pager  (Please use [email or web address] to page/ or secure chat through epic) If 7PM-7AM, please contact night-coverage [email or web address],  04/02/2024, 4:15 PM

## 2024-04-02 NOTE — Assessment & Plan Note (Signed)
 On records patient has DNR/DNI status -Consulted palliative care for determining goals of care

## 2024-04-02 NOTE — Assessment & Plan Note (Addendum)
 Discussed with patient regarding smoking cessation NicoDerm patch provided

## 2024-04-02 NOTE — Hospital Course (Signed)
 Miguel Marsh is a 63 year old male with history of COPD, chronic respiratory failure on 3 L of oxygen  at baseline, HTN, substance abuse cocaine, nonsmall cell lung cancer, anxiety, hypertension.  Presented to ED with chief complaint of progressive shortness of breath for past 3 days.  Had a transient chest pain few days ago which has subsided.  Has been progressively getting weak denies any fevers, denies any chills.  Mildly productive cough for past 2 weeks.  History of chronic tobacco abuse.  Reports he still smokes few cigarettes a day.    ED Evaluation: Upon arrival was requiring 4 L of oxygen , 88% on room air, currently 92% on room 3 L. Was tachypneic with respiratory rate of 25. Blood pressure 133/76, pulse 75, temperature 97.6 F (36.4 C), temperature source Oral, resp. rate 16, height 5\' 6"  (1.676 m), weight 88 kg, SpO2 92%.  Labs CBC WC 3.8, chloride 89, CO2 40, glucose 101, lactic acid 1.0, magnesium  1.8, troponin 6, BNP 11.0, procalcitonin <0.10. Respiratory viral panel negative Chest x-ray:IMPRESSION: Minimal residual left lower lobe infiltrates and atelectasis  It was requested for patient to be admitted for COPD exacerbation and possible pneumonia

## 2024-04-02 NOTE — Plan of Care (Signed)
  Problem: Education: Goal: Knowledge of General Education information will improve Description: Including pain rating scale, medication(s)/side effects and non-pharmacologic comfort measures Outcome: Progressing   Problem: Health Behavior/Discharge Planning: Goal: Ability to manage health-related needs will improve Outcome: Progressing   Problem: Clinical Measurements: Goal: Ability to maintain clinical measurements within normal limits will improve Outcome: Progressing Goal: Will remain free from infection Outcome: Progressing Goal: Diagnostic test results will improve Outcome: Progressing Goal: Respiratory complications will improve Outcome: Progressing Goal: Cardiovascular complication will be avoided Outcome: Progressing   Problem: Activity: Goal: Risk for activity intolerance will decrease Outcome: Not Progressing   Problem: Nutrition: Goal: Adequate nutrition will be maintained Outcome: Not Progressing   Problem: Coping: Goal: Level of anxiety will decrease Outcome: Progressing   Problem: Elimination: Goal: Will not experience complications related to bowel motility Outcome: Progressing Goal: Will not experience complications related to urinary retention Outcome: Progressing   Problem: Pain Managment: Goal: General experience of comfort will improve and/or be controlled Outcome: Not Progressing   Problem: Safety: Goal: Ability to remain free from injury will improve Outcome: Progressing   Problem: Skin Integrity: Goal: Risk for impaired skin integrity will decrease Outcome: Progressing   Problem: Education: Goal: Knowledge of disease or condition will improve Outcome: Progressing Goal: Knowledge of the prescribed therapeutic regimen will improve Outcome: Progressing Goal: Individualized Educational Video(s) Outcome: Progressing   Problem: Activity: Goal: Ability to tolerate increased activity will improve Outcome: Not Progressing Goal: Will  verbalize the importance of balancing activity with adequate rest periods Outcome: Not Progressing   Problem: Respiratory: Goal: Ability to maintain a clear airway will improve Outcome: Progressing Goal: Levels of oxygenation will improve Outcome: Progressing Goal: Ability to maintain adequate ventilation will improve Outcome: Progressing   Problem: Activity: Goal: Ability to tolerate increased activity will improve Outcome: Not Progressing   Problem: Clinical Measurements: Goal: Ability to maintain a body temperature in the normal range will improve Outcome: Progressing   Problem: Respiratory: Goal: Ability to maintain adequate ventilation will improve Outcome: Progressing Goal: Ability to maintain a clear airway will improve Outcome: Progressing

## 2024-04-02 NOTE — TOC Initial Note (Signed)
 Transition of Care [facility]) - Initial/Assessment Note    Patient Details  Name: Miguel Marsh MRN: [id number] Date of Birth: 06-Feb-1961  Transition of Care [facility] CM/SW Contact:    Grandville Lax, LCSWA Phone Number: 04/02/2024, 2:03 PM  Clinical Narrative:                 CSW notes pt is high risk for readmission. Pt arrived to hospital from [facility]. CSW spoke to [place] who states that pt is a resident at [facility]. Pt has assistance with completing his ADLs. Pt has a walker to use in the [facility]. Pt is also at O2 at baseline. Pt does not have HH PT currently but Edd Gong states that may be needed at D/C. TOC to follow.   Expected Discharge Plan: Rest Home ([facility] #[facility]) Barriers to Discharge: Continued Medical Work up   Patient Goals and CMS Choice Patient states their goals for this hospitalization and ongoing recovery are:: return to [facility] CMS Medicare.gov Compare Post Acute Care list provided to:: Patient Choice offered to / list presented to : Patient      Expected Discharge Plan and Services In-house Referral: Clinical Social Work Discharge Planning Services: CM Consult Post Acute Care Choice: Home Health Living arrangements for the past 2 months: Group Home                                      Prior Living Arrangements/Services Living arrangements for the past 2 months: Group Home Lives with:: Facility Resident Patient language and need for interpreter reviewed:: Yes Do you feel safe going back to the place where you live?: Yes      Need for Family Participation in Patient Care: Yes (Comment) Care giver support system in place?: Yes (comment) Current home services: DME Criminal Activity/Legal Involvement Pertinent to Current Situation/Hospitalization: No - Comment as needed  Activities of Daily Living   ADL Screening (condition at time of admission) Independently performs ADLs?: Yes (appropriate for developmental age) Is the  patient deaf or have difficulty hearing?: No Does the patient have difficulty seeing, even when wearing glasses/contacts?: No Does the patient have difficulty concentrating, remembering, or making decisions?: No  Permission Sought/Granted                  Emotional Assessment Appearance:: Appears stated age Attitude/Demeanor/Rapport: Engaged Affect (typically observed): Accepting Orientation: : Oriented to Self, Oriented to Place, Oriented to  Time, Oriented to Situation Alcohol  / Substance Use: Not Applicable Psych Involvement: No (comment)  Admission diagnosis:  Acute on chronic respiratory failure (HCC) [J96.20] Patient Active Problem List   Diagnosis Date Noted   Acute on chronic respiratory failure (HCC) 03/04/2024   Anxiety 01/15/2024   Cocaine abuse (HCC) 09/13/2023   CAP (community acquired pneumonia) 06/24/2023   Lung nodule 06/24/2023   COVID-19 virus infection 06/24/2023   Elevated MCV 03/06/2023   Acute metabolic encephalopathy 02/01/2023   Leukocytosis 10/06/2020   Goals of care, counseling/discussion    Palliative care by specialist    DNR (do not resuscitate) discussion    Hyperglycemia 04/12/2020   Chronic respiratory failure with hypoxia and hypercapnia (HCC) 01/22/2020   Acute on chronic respiratory failure with hypoxia and hypercapnia (HCC) 01/23/2018   Obesity, Class III, BMI 40-49.9 (morbid obesity) 01/01/2018   Acute exacerbation of chronic obstructive pulmonary disease (COPD) (HCC) 01/01/2018   Essential hypertension 12/09/2015  Non-small cell cancer of right lung (in remission) 12/09/2015   GERD (gastroesophageal reflux disease) 12/09/2015   Tobacco use disorder 12/17/2014   COPD exacerbation (HCC) 12/17/2014   Obesity 12/17/2014   Dyspnea    Difficulty walking 04/02/2013   Hip weakness 04/02/2013   PCP:  Lenn Quint, NP Pharmacy:   [facility] - [address] [place] [address] Phone:  [phone] Fax: [phone]     Social Drivers of Health (SDOH) Social History: SDOH Screenings   Food Insecurity: No Food Insecurity (04/02/2024)  Housing: Low Risk  (04/02/2024)  Transportation Needs: No Transportation Needs (04/02/2024)  Utilities: Not At Risk (04/02/2024)  Tobacco Use: Medium Risk (04/02/2024)   SDOH Interventions:     Readmission Risk Interventions    04/02/2024    2:02 PM 03/05/2024    8:31 AM 01/28/2024   12:56 PM  Readmission Risk Prevention Plan  Transportation Screening Complete Complete Complete  Medication Review [employer]) Complete Complete Complete  HRI or Home Care Consult Complete Complete Complete  SW Recovery Care/Counseling Consult Complete Complete Complete  Palliative Care Screening Not Applicable Not Applicable Not Applicable  Skilled Nursing Facility Not Applicable Not Applicable Not Applicable

## 2024-04-02 NOTE — Assessment & Plan Note (Addendum)
-   Consulting PT/OT for evaluation recommendations

## 2024-04-02 NOTE — Assessment & Plan Note (Signed)
-   History of non-small cancer -In remission Not on any treatment

## 2024-04-02 NOTE — ED Triage Notes (Signed)
 Pt c/o sob that started x 4 days ago; pt has had intermittent chest pain since then  Pt now c/o of neck pain  When first responders arrived pt found to be mid 80's and pt was placed on NRB and O2 sats increased to mid 90's

## 2024-04-02 NOTE — Assessment & Plan Note (Signed)
 Continue PPI.

## 2024-04-02 NOTE — Assessment & Plan Note (Signed)
 Baseline O2 demand 3 L Currently requiring 4 L of oxygen  satting 97% Maintaining O2 sat 88-92% Nebs, steroids and empiric antibiotics as above

## 2024-04-03 DIAGNOSIS — J441 Chronic obstructive pulmonary disease with (acute) exacerbation: Secondary | ICD-10-CM

## 2024-04-03 DIAGNOSIS — Z515 Encounter for palliative care: Secondary | ICD-10-CM

## 2024-04-03 DIAGNOSIS — J9621 Acute and chronic respiratory failure with hypoxia: Secondary | ICD-10-CM | POA: Diagnosis not present

## 2024-04-03 DIAGNOSIS — J9600 Acute respiratory failure, unspecified whether with hypoxia or hypercapnia: Secondary | ICD-10-CM | POA: Diagnosis present

## 2024-04-03 DIAGNOSIS — Z7189 Other specified counseling: Secondary | ICD-10-CM | POA: Diagnosis not present

## 2024-04-03 LAB — CBC
HCT: 38.5 % — ABNORMAL LOW (ref 39.0–52.0)
Hemoglobin: 12.4 g/dL — ABNORMAL LOW (ref 13.0–17.0)
MCH: 30.2 pg (ref 26.0–34.0)
MCHC: 32.2 g/dL (ref 30.0–36.0)
MCV: 93.7 fL (ref 80.0–100.0)
Platelets: 177 10*3/uL (ref 150–400)
RBC: 4.11 MIL/uL — ABNORMAL LOW (ref 4.22–5.81)
RDW: 11.9 % (ref 11.5–15.5)
WBC: 3 10*3/uL — ABNORMAL LOW (ref 4.0–10.5)
nRBC: 0 % (ref 0.0–0.2)

## 2024-04-03 LAB — BASIC METABOLIC PANEL WITH GFR
Anion gap: 9 (ref 5–15)
BUN: 21 mg/dL (ref 8–23)
CO2: 41 mmol/L — ABNORMAL HIGH (ref 22–32)
Calcium: 8.9 mg/dL (ref 8.9–10.3)
Chloride: 87 mmol/L — ABNORMAL LOW (ref 98–111)
Creatinine, Ser: 0.83 mg/dL (ref 0.61–1.24)
GFR, Estimated: >60 mL/min (ref >60)
Glucose, Bld: 143 mg/dL — ABNORMAL HIGH (ref 70–99)
Potassium: 4.5 mmol/L (ref 3.5–5.1)
Sodium: 137 mmol/L (ref 135–145)

## 2024-04-03 MED ORDER — METHYLPREDNISOLONE SODIUM SUCC 40 MG IJ SOLR
40.0000 mg | Freq: Two times a day (BID) | INTRAMUSCULAR | Status: DC
  Administered 2024-04-03: 40 mg via INTRAVENOUS
  Filled 2024-04-03: qty 1

## 2024-04-03 MED ORDER — LEVOFLOXACIN 500 MG PO TABS: 500.0000 mg | ORAL_TABLET | Freq: Every day | ORAL | Status: DC

## 2024-04-03 MED ORDER — METHYLPREDNISOLONE 4 MG PO TBPK: ORAL_TABLET | ORAL | 0 refills | Status: DC

## 2024-04-03 MED ORDER — LEVOFLOXACIN 500 MG PO TABS: 500.0000 mg | ORAL_TABLET | Freq: Every day | ORAL | 0 refills | Status: AC

## 2024-04-03 NOTE — Consult Note (Signed)
 Consultation Note Date: 04/03/2024   Patient Name: Miguel Marsh  DOB: [birth year]  MRN: [id number]  Age / Sex: 63 y.o., male  PCP: Lenn Quint, NP Referring Physician: Bobbetta Burnet, MD  Reason for Consultation: Establishing goals of care  HPI/Patient Profile: 63 y.o. male  with past medical history of NSCLC (s/p radiation treatment to RLL/RUL Feb 2025 with curative intent), COPD, chronic 3L oxygen , HTN, cocaine use, anxiety admitted on 04/02/2024 with acute on chronic respiratory failure treating COPD exacerbation and potential pneumonia.  Clinical Assessment and Goals of Care: Consult received and chart review completed. I reviewed previous palliative consultation/visits. I reviewed oncology notes. I met today with Miguel Marsh. We reviewed his health status. We discussed his COPD complicated by lung cancer. He recently completed radiation therapy and is due a follow up with scans to assess his status in a couple weeks. He is hoping to avoid systemic treatment but notes he did well with Keytruda previously and is open to treatment if needed.   We discussed his symptoms. He does report more gait imbalance, vision changes, and headache over the past weeks to month. He has not had this assessed or worked up. May need to consider head scan. I shared symptoms with Dr. Eilene Marsh for follow up and recommendations.   I discussed with [name] his wishes. He knows that his health is declining due to his lungs worsening. We discussed code status. He endorses desire for DNR but shares he has been on ventilator before. After some thought and discussion he shares that he would be okay with being intubated. However, he would not desire to be kept on ventilator more than 3 days without significant improvement. He identifies his brother, Miguel Marsh, as the person to help us  make decisions if he is unable. He shares that he has a  strong faith and I am right with the Lord. He does not fear death although hopeful for more time.   All questions/concerns addressed. He is missing a backpack - discussed with nursing staff who are helping to locate. Emotional support provided.   Primary Decision Maker PATIENT    SUMMARY OF RECOMMENDATIONS   - DNR - Okay for ventilator support temporarily (would not desire > 3 days without significant improvement - no trach)  Code Status/Advance Care Planning: DNR   Symptom Management:  Per attending  Prognosis:  Unable to determine  Discharge Planning: Home with Home Health      Primary Diagnoses: Present on Admission:  Acute on chronic respiratory failure (HCC)  Tobacco use disorder  Non-small cell cancer of right lung (in remission)  GERD (gastroesophageal reflux disease)  Dyspnea  Difficulty walking  CAP (community acquired pneumonia)  Anxiety  Acute exacerbation of chronic obstructive pulmonary disease (COPD) (HCC)   I have reviewed the medical record, interviewed the patient and family, and examined the patient. The following aspects are pertinent.  Past Medical History:  Diagnosis Date   Arthritis    Bronchitis    Cancer (HCC)    metastatic NSCLC (  followed by [facility])   COPD (chronic obstructive pulmonary disease) (HCC)    HTN (hypertension)    Social History   Socioeconomic History   Marital status: Single    Spouse name: Not on file   Number of children: Not on file   Years of education: Not on file   Highest education level: Not on file  Occupational History   Not on file  Tobacco Use   Smoking status: Former    Current packs/day: 0.10    Types: Cigarettes   Smokeless tobacco: Never  Vaping Use   Vaping status: Never Used  Substance and Sexual Activity   Alcohol  use: Yes    Comment: occ   Drug use: Yes    Types: Marijuana, Cocaine    Comment: occ   Sexual activity: Not Currently  Other Topics Concern   Not on file  Social History  Narrative   Not on file   Social Drivers of Health   Financial Resource Strain: Not on file  Food Insecurity: No Food Insecurity (04/02/2024)   Hunger Vital Sign    Worried About Running Out of Food in the Last Year: Never true    Ran Out of Food in the Last Year: Never true  Transportation Needs: No Transportation Needs (04/02/2024)   PRAPARE - Administrator, Civil Service (Medical): No    Lack of Transportation (Non-Medical): No  Physical Activity: Not on file  Stress: Not on file  Social Connections: Not on file   History reviewed. No pertinent family history. Scheduled Meds:  azithromycin   500 mg Oral Daily   Chlorhexidine  Gluconate Cloth  6 each Topical Q0600   clonazePAM   1 mg Oral BID   heparin   5,000 Units Subcutaneous Q8H   lactobacillus  1 g Oral TID WC   methylPREDNISolone  (SOLU-MEDROL ) injection  40 mg Intravenous Q12H   nicotine   21 mg Transdermal Daily   pantoprazole   40 mg Oral Daily   sodium chloride  flush  3 mL Intravenous Q12H   Continuous Infusions:  cefTRIAXone  (ROCEPHIN )  IV 1 g (04/03/24 0914)   PRN Meds:.acetaminophen  **OR** acetaminophen , bisacodyl , furosemide , hydrALAZINE , HYDROmorphone  (DILAUDID ) injection, ipratropium, levalbuterol , ondansetron  **OR** ondansetron  (ZOFRAN ) IV, oxyCODONE , senna-docusate, sodium chloride  flush, sodium phosphate , zolpidem  Allergies  Allergen Reactions   Ibuprofen  Shortness Of Breath   Review of Systems  Constitutional:  Negative for activity change and appetite change.  HENT:  Negative for trouble swallowing.   Eyes:  Positive for visual disturbance.  Respiratory:  Positive for cough and shortness of breath.   Neurological:  Positive for headaches. Negative for speech difficulty.    Physical Exam Vitals and nursing note reviewed.  Constitutional:      General: He is not in acute distress.    Appearance: He is ill-appearing.   Cardiovascular:     Rate and Rhythm: Normal rate.  Pulmonary:     Effort:  No tachypnea, accessory muscle usage or respiratory distress.   Neurological:     Mental Status: He is alert and oriented to person, place, and time.     Vital Signs: BP 127/71   Pulse 61   Temp 97.8 F (36.6 C) (Oral)   Resp 16   Ht 5' 6 (1.676 m)   Wt 87.4 kg   SpO2 95%   BMI 31.10 kg/m  Pain Scale: 0-10   Pain Score: 2    SpO2: SpO2: 95 % O2 Device:SpO2: 95 % O2 Flow Rate: .O2 Flow Rate (L/min): 3 L/min  IO: Intake/output summary:  Intake/Output Summary (Last 24 hours) at 04/03/2024 1035 Last data filed at 04/03/2024 0900 Gross per 24 hour  Intake 874.16 ml  Output 950 ml  Net -75.84 ml    LBM: Last BM Date : 04/02/24 Baseline Weight: Weight: 86.6 kg Most recent weight: Weight: 87.4 kg     Palliative Assessment/Data:     Time Total: 75 min  Greater than 50%  of this time was spent counseling and coordinating care related to the above assessment and plan.  Signed by: Vila Grayer, NP Palliative Medicine Team Pager # [phone] (M-F 8a-5p) Team Phone # [phone] (Nights/Weekends)

## 2024-04-03 NOTE — Plan of Care (Signed)

## 2024-04-03 NOTE — NC FL2 (Signed)
 Miguel Marsh  MEDICAID FL2 LEVEL OF CARE FORM     IDENTIFICATION  Patient Name: Miguel Marsh Birthdate: Dec 07, 1960 Sex: male Admission Date (Current Location): 04/02/2024  [facility] and [place] Number:  [name] and Address:  [facility],  [address], [name] [address]      Provider Number: [id number]  Attending Physician Name and Address:  Bobbetta Burnet, MD  Relative Name and Phone Number:       Current Level of Care: Hospital Recommended Level of Care: [facility] Prior Approval Number:    Date Approved/Denied:   PASRR Number:    Discharge Plan: Other (Comment) (Family Care Home)    Current Diagnoses: Patient Active Problem List   Diagnosis Date Noted   Acute respiratory failure (HCC) 04/03/2024   Acute on chronic respiratory failure (HCC) 03/04/2024   Anxiety 01/15/2024   Cocaine abuse (HCC) 09/13/2023   CAP (community acquired pneumonia) 06/24/2023   Lung nodule 06/24/2023   COVID-19 virus infection 06/24/2023   Elevated MCV 03/06/2023   Acute metabolic encephalopathy 02/01/2023   Leukocytosis 10/06/2020   Goals of care, counseling/discussion    Palliative care by specialist    DNR (do not resuscitate) discussion    Hyperglycemia 04/12/2020   Chronic respiratory failure with hypoxia and hypercapnia (HCC) 01/22/2020   Acute on chronic respiratory failure with hypoxia and hypercapnia (HCC) 01/23/2018   Obesity, Class III, BMI 40-49.9 (morbid obesity) 01/01/2018   Acute exacerbation of chronic obstructive pulmonary disease (COPD) (HCC) 01/01/2018   Essential hypertension 12/09/2015   Non-small cell cancer of right lung (in remission) 12/09/2015   GERD (gastroesophageal reflux disease) 12/09/2015   Tobacco use disorder 12/17/2014   COPD exacerbation (HCC) 12/17/2014   Obesity 12/17/2014   Dyspnea    Difficulty walking 04/02/2013   Hip weakness 04/02/2013    Orientation RESPIRATION BLADDER Height & Weight     Self,  Time, Place, Situation  O2 (3L) Continent Weight: 192 lb 10.9 oz (87.4 kg) Height:  5\' 6"  (167.6 cm)  BEHAVIORAL SYMPTOMS/MOOD NEUROLOGICAL BOWEL NUTRITION STATUS      Continent Diet (Regular)  AMBULATORY STATUS COMMUNICATION OF NEEDS Skin   Supervision Verbally Normal                       Personal Care Assistance Level of Assistance  Bathing, Feeding, Dressing Bathing Assistance: Limited assistance Feeding assistance: Independent Dressing Assistance: Limited assistance     Functional Limitations Info  Sight, Hearing, Speech Sight Info: Adequate Hearing Info: Adequate Speech Info: Adequate    SPECIAL CARE FACTORS FREQUENCY                       Contractures Contractures Info: Not present    Additional Factors Info  Code Status, Allergies Code Status Info: FULL Allergies Info: Ibuprofen            Current Medications (04/03/2024):  This is the current hospital active medication list Current Facility-Administered Medications  Medication Dose Route Frequency Provider Last Rate Last Admin   acetaminophen  (TYLENOL ) tablet 650 mg  650 mg Oral Q6H PRN Amber Bail A, MD   650 mg at 04/02/24 1929   Or   acetaminophen  (TYLENOL ) suppository 650 mg  650 mg Rectal Q6H PRN Shahmehdi, Domenick Friedlander A, MD       azithromycin  (ZITHROMAX ) tablet 500 mg  500 mg Oral Daily Segars, Jonathan, MD   500 mg at 04/03/24 0908   bisacodyl  (DULCOLAX) EC  tablet 5 mg  5 mg Oral Daily PRN Shahmehdi, Domenick Friedlander A, MD       cefTRIAXone  (ROCEPHIN ) 1 g in sodium chloride  0.9 % 100 mL IVPB  1 g Intravenous Q24H Arnulfo Larch, MD 200 mL/hr at 04/03/24 0914 1 g at 04/03/24 0914   Chlorhexidine  Gluconate Cloth 2 % PADS 6 each  6 each Topical Q0600 Shahmehdi, Seyed A, MD   6 each at 04/03/24 0600   clonazePAM  (KLONOPIN ) tablet 1 mg  1 mg Oral BID Shahmehdi, Seyed A, MD   1 mg at 04/03/24 0908   furosemide  (LASIX ) tablet 20 mg  20 mg Oral Daily PRN Bobbetta Burnet, MD       heparin  injection 5,000  Units  5,000 Units Subcutaneous Q8H Shahmehdi, Seyed A, MD   5,000 Units at 04/03/24 0542   hydrALAZINE  (APRESOLINE ) injection 10 mg  10 mg Intravenous Q4H PRN Shahmehdi, Seyed A, MD       HYDROmorphone  (DILAUDID ) injection 0.5-1 mg  0.5-1 mg Intravenous Q2H PRN Shahmehdi, Seyed A, MD       ipratropium (ATROVENT ) nebulizer solution 0.5 mg  0.5 mg Nebulization Q6H PRN Shahmehdi, Seyed A, MD       lactobacillus (FLORANEX/LACTINEX) granules 1 g  1 g Oral TID WC Shahmehdi, Seyed A, MD   1 g at 04/03/24 0908   levalbuterol (XOPENEX) nebulizer solution 0.63 mg  0.63 mg Nebulization Q6H PRN Shahmehdi, Seyed A, MD   0.63 mg at 04/03/24 1039   levofloxacin  (LEVAQUIN ) tablet 500 mg  500 mg Oral Daily Shahmehdi, Seyed A, MD       methylPREDNISolone  sodium succinate (SOLU-MEDROL ) 40 mg/mL injection 40 mg  40 mg Intravenous Q12H Shahmehdi, Seyed A, MD   40 mg at 04/03/24 1610   nicotine  (NICODERM CQ  - dosed in mg/24 hours) patch 21 mg  21 mg Transdermal Daily Shahmehdi, Seyed A, MD   21 mg at 04/03/24 0909   ondansetron  (ZOFRAN ) tablet 4 mg  4 mg Oral Q6H PRN Shahmehdi, Seyed A, MD       Or   ondansetron  (ZOFRAN ) injection 4 mg  4 mg Intravenous Q6H PRN Shahmehdi, Seyed A, MD       oxyCODONE  (Oxy IR/ROXICODONE ) immediate release tablet 15 mg  15 mg Oral Q4H PRN Shahmehdi, Seyed A, MD   15 mg at 04/02/24 2117   pantoprazole  (PROTONIX ) EC tablet 40 mg  40 mg Oral Daily Shahmehdi, Seyed A, MD   40 mg at 04/03/24 0910   senna-docusate (Senokot-S) tablet 1 tablet  1 tablet Oral QHS PRN Shahmehdi, Seyed A, MD       sodium chloride  flush (NS) 0.9 % injection 3 mL  3 mL Intravenous Q12H Shahmehdi, Seyed A, MD   3 mL at 04/03/24 0911   sodium chloride  flush (NS) 0.9 % injection 3-10 mL  3-10 mL Intravenous PRN Shahmehdi, Seyed A, MD       sodium phosphate  (FLEET) enema 1 enema  1 enema Rectal Once PRN Shahmehdi, Seyed A, MD       zolpidem  (AMBIEN ) tablet 5 mg  5 mg Oral QHS PRN,MR X 1 Shahmehdi, Seyed A, MD          Discharge Medications: Allergies as of 04/03/2024       Reactions   Ibuprofen  Shortness Of Breath        Medication List     TAKE these medications    acetaminophen  325 MG tablet Commonly known as: TYLENOL  Take 2 tablets (650 mg  total) by mouth every 6 (six) hours as needed for mild pain (pain score 1-3) (or Fever >/= 101).   albuterol  108 (90 Base) MCG/ACT inhaler Commonly known as: VENTOLIN  HFA Inhale 2 puffs into the lungs every 6 (six) hours as needed for shortness of breath or wheezing (Cough).   albuterol  (2.5 MG/3ML) 0.083% nebulizer solution Commonly known as: PROVENTIL  Inhale 3 mLs (2.5 mg total) into the lungs every 6 (six) hours as needed for wheezing or shortness of breath.   Breztri  Aerosphere 160-9-4.8 MCG/ACT Aero inhaler Generic drug: budesonide -glycopyrrolate -formoterol  Inhale 2 puffs into the lungs 2 (two) times daily.   clonazePAM  1 MG tablet Commonly known as: KLONOPIN  Take 1 tablet (1 mg total) by mouth 2 (two) times daily as needed for anxiety.   furosemide  40 MG tablet Commonly known as: LASIX  Take 0.5 tablets (20 mg total) by mouth See admin instructions. Take once a day  as needed for fluid/swelling What changed:  when to take this reasons to take this additional instructions   guaiFENesin  600 MG 12 hr tablet Commonly known as: Mucinex  Take 1 tablet (600 mg total) by mouth 2 (two) times daily.   hydrochlorothiazide  25 MG tablet Commonly known as: HYDRODIURIL  Take 25 mg by mouth every morning.   levofloxacin  500 MG tablet Commonly known as: LEVAQUIN  Take 1 tablet (500 mg total) by mouth daily for 5 days.   methylPREDNISolone  4 MG Tbpk tablet Commonly known as: MEDROL  DOSEPAK Medrol  Dosepak take as instructed   nicotine  21 mg/24hr patch Commonly known as: NICODERM CQ  - dosed in mg/24 hours Place 21 mg onto the skin daily.   omeprazole  40 MG capsule Commonly known as: PRILOSEC Take 1 capsule (40 mg total) by mouth in the  morning and at bedtime.   OXYGEN  Inhale 3 L into the lungs continuous.   traZODone  50 MG tablet Commonly known as: DESYREL  Take 1 tablet (50 mg total) by mouth at bedtime as needed for sleep.         Relevant Imaging Results:  Relevant Lab Results:   Additional Information SSN: [id number], LCSWA

## 2024-04-03 NOTE — TOC Transition Note (Signed)
 Transition of Care [facility]) - Discharge Note   Patient Details  Name: Miguel Marsh MRN: [id number] Date of Birth: Apr 02, 1961  Transition of Care [facility]) CM/SW Contact:  Grandville Lax, LCSWA Phone Number: 04/03/2024, 11:47 AM   Clinical Narrative:    CSW updated that pt is medically stable for D/C back to [facility] today. CSW spoke to [facility] who states they can accept back. D/C summary and Fl2 sent to [facility] via secure email, she will review. PT recommending no PT follow up. Pt has home O2 supplied through [employer]. Edd Gong updated that they will provide transportation, CSW provide her with number for floor 300 nurses station to call when they arrive. TOC signing off.   Final next level of care: Other (comment) (Family Care Home) Barriers to Discharge: Barriers Resolved   Patient Goals and CMS Choice Patient states their goals for this hospitalization and ongoing recovery are:: return to [facility] CMS Medicare.gov Compare Post Acute Care list provided to:: Patient Choice offered to / list presented to : Patient      Discharge Placement                       Discharge Plan and Services Additional resources added to the After Visit Summary for   In-house Referral: Clinical Social Work Discharge Planning Services: CM Consult Post Acute Care Choice: Home Health                               Social Drivers of Health (SDOH) Interventions SDOH Screenings   Food Insecurity: No Food Insecurity (04/02/2024)  Housing: Low Risk  (04/02/2024)  Transportation Needs: No Transportation Needs (04/02/2024)  Utilities: Not At Risk (04/02/2024)  Tobacco Use: Medium Risk (04/02/2024)     Readmission Risk Interventions    04/02/2024    2:02 PM 03/05/2024    8:31 AM 01/28/2024   12:56 PM  Readmission Risk Prevention Plan  Transportation Screening Complete Complete Complete  Medication Review [employer]) Complete Complete Complete  HRI or Home Care Consult Complete Complete  Complete  SW Recovery Care/Counseling Consult Complete Complete Complete  Palliative Care Screening Not Applicable Not Applicable Not Applicable  Skilled Nursing Facility Not Applicable Not Applicable Not Applicable

## 2024-04-03 NOTE — Evaluation (Signed)
 Physical Therapy Evaluation Patient Details Name: JERAL ZICK MRN: [id number] DOB: [birth year] Today's Date: 04/03/2024  History of Present Illness  Miguel Marsh is a 63 year old male with history of COPD, chronic respiratory failure on 3 L of oxygen  at baseline, HTN, substance abuse cocaine, nonsmall cell lung cancer, anxiety, hypertension.  Presented to ED with chief complaint of progressive shortness of breath for past 3 days.  Had a transient chest pain few days ago which has subsided.  Has been progressively getting weak denies any fevers, denies any chills.  Mildly productive cough for past 2 weeks.  History of chronic tobacco abuse.  Reports he still smokes few cigarettes a day.   Clinical Impression  Patient functioning at baseline for functional mobility and gait demonstrating good return for bed mobility, transfers and ambulating in room/hallway without loss of balance using his SPC. Patient tolerated sitting up in chair after therapy. Plan:  Patient discharged from physical therapy to care of nursing for ambulation daily as tolerated for length of stay.           If plan is discharge home, recommend the following: Help with stairs or ramp for entrance   Can travel by private vehicle        Equipment Recommendations None recommended by PT  Recommendations for Other Services       Functional Status Assessment Patient has had a recent decline in their functional status and/or demonstrates limited ability to make significant improvements in function in a reasonable and predictable amount of time     Precautions / Restrictions Precautions Precautions: None Recall of Precautions/Restrictions: Intact Restrictions Weight Bearing Restrictions Per Provider Order: No      Mobility  Bed Mobility Overal bed mobility: Modified Independent                  Transfers Overall transfer level: Modified independent Equipment used: Straight cane                     Ambulation/Gait Ambulation/Gait assistance: Modified independent (Device/Increase time) Gait Distance (Feet): 100 Feet Assistive device: Straight cane Gait Pattern/deviations: Step-to pattern, Decreased step length - right, Decreased step length - left, Decreased stride length Gait velocity: decreased     General Gait Details: slightly labored movement without loss of balance with mostly step-to pattern, limited mostly due to fatigue, on 3 LPM with SpO2 at 95%  Stairs            Wheelchair Mobility     Tilt Bed    Modified Rankin (Stroke Patients Only)       Balance Overall balance assessment: Needs assistance Sitting-balance support: Feet supported, No upper extremity supported Sitting balance-Leahy Scale: Good Sitting balance - Comments: seated at EOB   Standing balance support: During functional activity, Single extremity supported Standing balance-Leahy Scale: Fair Standing balance comment: fair/good using his SPC                             Pertinent Vitals/Pain Pain Assessment Pain Assessment: No/denies pain    Home Living Family/patient expects to be discharged to:: Group home Living Arrangements: Group Home Available Help at Discharge: Other (Comment);Family;Available PRN/intermittently Type of Home: Group Home Home Access: Level entry       Home Layout: One level Home Equipment: Wheelchair - manual;Cane - single point;Shower seat;Adaptive equipment      Prior Function Prior Level of Function : Needs assist  Physical Assist : ADLs (physical);Mobility (physical) Mobility (physical): Bed mobility;Transfers;Gait;Stairs   Mobility Comments: Household and short distance community ambulator with SPC. ADLs Comments: Pt reports independence with ADL's assist for IADL'w by group home staff.     Extremity/Trunk Assessment   Upper Extremity Assessment Upper Extremity Assessment: Overall WFL for tasks assessed    Lower  Extremity Assessment Lower Extremity Assessment: Overall WFL for tasks assessed    Cervical / Trunk Assessment Cervical / Trunk Assessment: Normal  Communication   Communication Communication: No apparent difficulties    Cognition Arousal: Alert     PT - Cognitive impairments: No apparent impairments                         Following commands: Intact       Cueing Cueing Techniques: Verbal cues     General Comments      Exercises     Assessment/Plan    PT Assessment Patient does not need any further PT services  PT Problem List         PT Treatment Interventions      PT Goals (Current goals can be found in the Care Plan section)  Acute Rehab PT Goals Patient Stated Goal: return home PT Goal Formulation: With patient Time For Goal Achievement: 04/03/24 Potential to Achieve Goals: Good    Frequency       Co-evaluation               AM-PAC PT "6 Clicks" Mobility  Outcome Measure Help needed turning from your back to your side while in a flat bed without using bedrails?: None Help needed moving from lying on your back to sitting on the side of a flat bed without using bedrails?: None Help needed moving to and from a bed to a chair (including a wheelchair)?: None Help needed standing up from a chair using your arms (e.g., wheelchair or bedside chair)?: None Help needed to walk in hospital room?: A Little Help needed climbing 3-5 steps with a railing? : A Little 6 Click Score: 22    End of Session Equipment Utilized During Treatment: Oxygen  Activity Tolerance: Patient tolerated treatment well;Patient limited by fatigue Patient left: in chair;with call bell/phone within reach Nurse Communication: Mobility status PT Visit Diagnosis: Unsteadiness on feet (R26.81);Other abnormalities of gait and mobility (R26.89);Muscle weakness (generalized) (M62.81)    Time: 1001-1030 PT Time Calculation (min) (ACUTE ONLY): 29 min   Charges:   PT  Evaluation $PT Eval Moderate Complexity: 1 Mod PT Treatments $Therapeutic Activity: 23-37 mins PT General Charges $$ ACUTE PT VISIT: 1 Visit         1:50 PM, 04/03/24 Walton Guppy, MPT Physical Therapist with [facility] [phone] office [phone] mobile phone

## 2024-04-03 NOTE — Care Management Obs Status (Signed)
 MEDICARE OBSERVATION STATUS NOTIFICATION   Patient Details  Name: Miguel Marsh MRN: [id number] Date of Birth: [birth year]   Medicare Observation Status Notification Given:  Yes    Grandville Lax, LCSWA 04/03/2024, 11:28 AM

## 2024-04-03 NOTE — Care Management CC44 (Signed)
 Condition Code 44 Documentation Completed  Patient Details  Name: Miguel Marsh MRN: [id number] Date of Birth: 03/01/1961   Condition Code 44 given:  Yes Patient signature on Condition Code 44 notice:  Yes Documentation of 2 MD's agreement:  Yes Code 44 added to claim:  Yes    Grandville Lax, LCSWA 04/03/2024, 11:28 AM

## 2024-04-03 NOTE — Discharge Summary (Signed)
 Physician Discharge Summary   Patient: Miguel Marsh MRN: [id number] DOB: 03-31-1961  Admit date:     04/02/2024  Discharge date: 04/03/24  Discharge Physician: Miguel Marsh   PCP: Miguel Quint, NP   Recommendations at discharge:   Follow-up with PCP in 1 week Continue recommended medication of taper steroids and antibiotics Continue DuoNeb treatment every 4-6 hours Incentive spirometer,, fall precautions  Discharge Diagnoses: Principal Problem:   Acute on chronic respiratory failure (HCC) Active Problems:   Tobacco use disorder   CAP (community acquired pneumonia)   Difficulty walking   Dyspnea   Non-small cell cancer of right lung (in remission)   GERD (gastroesophageal reflux disease)   Acute exacerbation of chronic obstructive pulmonary disease (COPD) (HCC)   Palliative care by specialist   Anxiety   Acute respiratory failure (HCC)  Resolved Problems:   * No resolved hospital problems. *  Hospital Course: Miguel Marsh is a 63 year old male with history of COPD, chronic respiratory failure on 3 L of oxygen  at baseline, HTN, substance abuse cocaine, nonsmall cell lung cancer, anxiety, hypertension.  Presented to ED with chief complaint of progressive shortness of breath for past 3 days.  Had a transient chest pain few days ago which has subsided.  Has been progressively getting weak denies any fevers, denies any chills.  Mildly productive cough for past 2 weeks.  History of chronic tobacco abuse.  Reports he still smokes few cigarettes a day.    ED Evaluation: Upon arrival was requiring 4 L of oxygen , 88% on room air, currently 92% on room 3 L. Was tachypneic with respiratory rate of 25. Blood pressure 133/76, pulse 75, temperature 97.6 F (36.4 C), temperature source Oral, resp. rate 16, height 5\' 6"  (1.676 m), weight 88 kg, SpO2 92%.  Labs CBC WC 3.8, chloride 89, CO2 40, glucose 101, lactic acid 1.0, magnesium  1.8, troponin 6, BNP 11.0, procalcitonin  <0.10. Respiratory viral panel negative Chest x-ray:IMPRESSION: Minimal residual left lower lobe infiltrates and atelectasis  It was requested for patient to be admitted for COPD exacerbation and possible pneumonia    * Acute on chronic respiratory failure (HCC) - Acute on chronic hypoxic respiratory failure -Baseline O2 demand 3 L, currently requiring 4 L, satting 97% -Diffuse wheezing and rhonchi, continue with DuoNeb bronchodilators, IV steroids, empiric IV antibiotics -Chest x-ray atelectasis versus left lower lobe infiltrate - With aggressive above treatment patient returned to baseline, currently satting 92% on 3 L of oxygen  which is his baseline-improved shortness of breath  Tobacco use disorder Discussed with patient regarding smoking cessation NicoDerm patch provided  Acute exacerbation of chronic obstructive pulmonary disease (COPD) (HCC) Baseline O2 demand 3 L Much improved posted Down from 4 L to 3 L baseline satting 92% Proved wheezing and shortness of breath Maintaining O2 sat 88-92% Continue nebs bronchodilators, change IV steroids to p.o. prednisone  taper, continue P.o. antibiotics of Levaquin   GERD (gastroesophageal reflux disease) - Continue PPI  Non-small cell cancer of right lung (in remission) - History of non-small cancer -In remission Not on any treatment  Dyspnea - Due to acute alcohol  respiratory failure, COPD exacerbation Management as above  Difficulty walking - Consulting PT/OT with fall precaution      Disposition: Skilled nursing facility Diet recommendation:  Discharge Diet Orders (From admission, onward)     Start     Ordered   04/03/24 0000  Diet - low sodium heart healthy        04/03/24 1108  Cardiac and Carb modified diet DISCHARGE MEDICATION: Allergies as of 04/03/2024       Reactions   Ibuprofen  Shortness Of Breath        Medication List     TAKE these medications    acetaminophen  325 MG  tablet Commonly known as: TYLENOL  Take 2 tablets (650 mg total) by mouth every 6 (six) hours as needed for mild pain (pain score 1-3) (or Fever >/= 101).   albuterol  108 (90 Base) MCG/ACT inhaler Commonly known as: VENTOLIN  HFA Inhale 2 puffs into the lungs every 6 (six) hours as needed for shortness of breath or wheezing (Cough).   albuterol  (2.5 MG/3ML) 0.083% nebulizer solution Commonly known as: PROVENTIL  Inhale 3 mLs (2.5 mg total) into the lungs every 6 (six) hours as needed for wheezing or shortness of breath.   Breztri  Aerosphere 160-9-4.8 MCG/ACT Aero inhaler Generic drug: budesonide -glycopyrrolate -formoterol  Inhale 2 puffs into the lungs 2 (two) times daily.   clonazePAM  1 MG tablet Commonly known as: KLONOPIN  Take 1 tablet (1 mg total) by mouth 2 (two) times daily as needed for anxiety.   furosemide  40 MG tablet Commonly known as: LASIX  Take 0.5 tablets (20 mg total) by mouth See admin instructions. Take once a day  as needed for fluid/swelling What changed:  when to take this reasons to take this additional instructions   guaiFENesin  600 MG 12 hr tablet Commonly known as: Mucinex  Take 1 tablet (600 mg total) by mouth 2 (two) times daily.   hydrochlorothiazide  25 MG tablet Commonly known as: HYDRODIURIL  Take 25 mg by mouth every morning.   levofloxacin  500 MG tablet Commonly known as: LEVAQUIN  Take 1 tablet (500 mg total) by mouth daily for 5 days.   methylPREDNISolone  4 MG Tbpk tablet Commonly known as: MEDROL  DOSEPAK Medrol  Dosepak take as instructed   nicotine  21 mg/24hr patch Commonly known as: NICODERM CQ  - dosed in mg/24 hours Place 21 mg onto the skin daily.   omeprazole  40 MG capsule Commonly known as: PRILOSEC Take 1 capsule (40 mg total) by mouth in the morning and at bedtime.   OXYGEN  Inhale 3 L into the lungs continuous.   traZODone  50 MG tablet Commonly known as: DESYREL  Take 1 tablet (50 mg total) by mouth at bedtime as needed for  sleep.        Discharge Exam: Filed Weights   04/02/24 1500 04/03/24 0437 04/03/24 0500  Weight: 88 kg 87.4 kg 87.4 kg        General:  AAO x 3,  cooperative, no distress;   HEENT:  Normocephalic, PERRL, otherwise with in Normal limits   Neuro:  CNII-XII intact. , normal motor and sensation, reflexes intact   Lungs:   Clear to auscultation BL, Respirations unlabored,  No wheezes / crackles  Cardio:    S1/S2, RRR, No murmure, No Rubs or Gallops   Abdomen:  Soft, non-tender, bowel sounds active all four quadrants, no guarding or peritoneal signs.  Muscular  skeletal:  Limited exam -global generalized weaknesses - in bed, able to move all 4 extremities,   2+ pulses,  symmetric, No pitting edema  Skin:  Dry, warm to touch, negative for any Rashes,  Wounds: Please see nursing documentation          Condition at discharge: good  The results of significant diagnostics from this hospitalization (including imaging, microbiology, ancillary and laboratory) are listed below for reference.   Imaging Studies: DG Chest Portable 1 View Result Date: 04/02/2024 CLINICAL DATA:  Dyspnea  EXAM: PORTABLE CHEST 1 VIEW COMPARISON:  Mar 04, 2024 FINDINGS: Minimal residual left lower lobe infiltrates and atelectasis without significant consolidations Heart and mediastinum normal No pleural effusions or pneumothorax IMPRESSION: Minimal residual left lower lobe infiltrates and atelectasis. Electronically Signed   By: Fredrich Jefferson M.D.   On: 04/02/2024 09:14   DG Chest Port 1 View Result Date: 03/04/2024 CLINICAL DATA:  dyspnea EXAM: PORTABLE CHEST - 1 VIEW COMPARISON:  January 27, 2024 FINDINGS: The right lower lobe nodule on the prior CT is not well visualized on today's radiograph. no lobar consolidation, pleural effusion, or pneumothorax. No cardiomegaly. Aortic atherosclerosis. No acute fracture or destructive lesion. Multilevel thoracic osteophytosis. IMPRESSION: No acute cardiopulmonary abnormality.  Electronically Signed   By: Rance Burrows M.D.   On: 03/04/2024 17:22    Microbiology: Results for orders placed or performed during the hospital encounter of 04/02/24  Resp panel by RT-PCR (RSV, Flu A&B, Covid) Anterior Nasal Swab     Status: None   Collection Time: 04/02/24  8:45 AM   Specimen: Anterior Nasal Swab  Result Value Ref Range Status   SARS Coronavirus 2 by RT PCR NEGATIVE NEGATIVE Final    Comment: (NOTE) SARS-CoV-2 target nucleic acids are NOT DETECTED.  The SARS-CoV-2 RNA is generally detectable in upper respiratory specimens during the acute phase of infection. The lowest concentration of SARS-CoV-2 viral copies this assay can detect is 138 copies/mL. A negative result does not preclude SARS-Cov-2 infection and should not be used as the sole basis for treatment or other patient management decisions. A negative result may occur with  improper specimen collection/handling, submission of specimen other than nasopharyngeal swab, presence of viral mutation(s) within the areas targeted by this assay, and inadequate number of viral copies(<138 copies/mL). A negative result must be combined with clinical observations, patient history, and epidemiological information. The expected result is Negative.  Fact Sheet for Patients:  BloggerCourse.com  Fact Sheet for Healthcare Providers:  SeriousBroker.it  This test is no t yet approved or cleared by the [place]  FDA and  has been authorized for detection and/or diagnosis of SARS-CoV-2 by FDA under an Emergency Use Authorization (EUA). This EUA will remain  in effect (meaning this test can be used) for the duration of the COVID-19 declaration under Section 564(b)(1) of the Act, 21 U.S.C.section 360bbb-3(b)(1), unless the authorization is terminated  or revoked sooner.       Influenza A by PCR NEGATIVE NEGATIVE Final   Influenza B by PCR NEGATIVE NEGATIVE Final     Comment: (NOTE) The Xpert Xpress SARS-CoV-2/FLU/RSV plus assay is intended as an aid in the diagnosis of influenza from Nasopharyngeal swab specimens and should not be used as a sole basis for treatment. Nasal washings and aspirates are unacceptable for Xpert Xpress SARS-CoV-2/FLU/RSV testing.  Fact Sheet for Patients: BloggerCourse.com  Fact Sheet for Healthcare Providers: SeriousBroker.it  This test is not yet approved or cleared by the [place]  FDA and has been authorized for detection and/or diagnosis of SARS-CoV-2 by FDA under an Emergency Use Authorization (EUA). This EUA will remain in effect (meaning this test can be used) for the duration of the COVID-19 declaration under Section 564(b)(1) of the Act, 21 U.S.C. section 360bbb-3(b)(1), unless the authorization is terminated or revoked.     Resp Syncytial Virus by PCR NEGATIVE NEGATIVE Final    Comment: (NOTE) Fact Sheet for Patients: BloggerCourse.com  Fact Sheet for Healthcare Providers: SeriousBroker.it  This test is not yet approved or cleared by the  [place]  FDA and has been authorized for detection and/or diagnosis of SARS-CoV-2 by FDA under an Emergency Use Authorization (EUA). This EUA will remain in effect (meaning this test can be used) for the duration of the COVID-19 declaration under Section 564(b)(1) of the Act, 21 U.S.C. section 360bbb-3(b)(1), unless the authorization is terminated or revoked.  Performed at [facility], [address] [place], [place] [address]   Culture, blood (routine x 2)     Status: None (Preliminary result)   Collection Time: 04/02/24 10:28 AM   Specimen: BLOOD  Result Value Ref Range Status   Specimen Description BLOOD BLOOD RIGHT ARM RAC  Final   Special Requests Blood Culture adequate volume  Final   Culture   Final    NO GROWTH < 24 HOURS Performed at [facility], [address] [place], [place] [address]    Report Status PENDING  Incomplete  Culture, blood (routine x 2)     Status: None (Preliminary result)   Collection Time: 04/02/24 10:28 AM   Specimen: BLOOD  Result Value Ref Range Status   Specimen Description BLOOD BLOOD RIGHT HAND  Final   Special Requests   Final    Blood Culture adequate volume BOTTLES DRAWN AEROBIC AND ANAEROBIC   Culture   Final    NO GROWTH < 24 HOURS Performed at [facility], [address] [place], [place] [address]    Report Status PENDING  Incomplete  MRSA Next Gen by PCR, Nasal     Status: None   Collection Time: 04/02/24  2:30 PM   Specimen: Nasal Mucosa; Nasal Swab  Result Value Ref Range Status   MRSA by PCR Next Gen NOT DETECTED NOT DETECTED Final    Comment: (NOTE) The GeneXpert MRSA Assay (FDA approved for NASAL specimens only), is one component of a comprehensive MRSA colonization surveillance program. It is not intended to diagnose MRSA infection nor to guide or monitor treatment for MRSA infections. Test performance is not FDA approved in patients less than 50 years old. Performed at [facility], [address], [place], [place] [address]     Labs: CBC: Recent Labs  Lab 04/02/24 0847 04/03/24 0421  WBC 3.8* 3.0*  NEUTROABS 1.8  --   HGB 13.4 12.4*  HCT 42.7 38.5*  MCV 96.6 93.7  PLT 173 177   Basic Metabolic Panel: Recent Labs  Lab 04/02/24 0847 04/03/24 0421  NA 135 137  K 3.8 4.5  CL 89* 87*  CO2 40* 41*  GLUCOSE 101* 143*  BUN 16 21  CREATININE 0.82 0.83  CALCIUM 9.1 8.9  MG 1.8  --   PHOS 3.5  --    Liver Function Tests: Recent Labs  Lab 04/02/24 0847  AST 15  ALT 13  ALKPHOS 61  BILITOT 1.1  PROT 7.3  ALBUMIN 3.6   CBG: No results for input(s): "GLUCAP" in the last 168 hours.  Discharge time spent: greater than 30 minutes.  Signed: Bobbetta Burnet, MD [facility] 04/03/2024

## 2024-04-07 LAB — CULTURE, BLOOD (ROUTINE X 2)
Culture: NO GROWTH
Culture: NO GROWTH
Special Requests: ADEQUATE
Special Requests: ADEQUATE

## 2024-04-15 NOTE — Progress Notes (Signed)
 ------------------------------------------------------------------------------- Attestation signed by Lynwood Dallas Cousin, MD at 04/15/2024  2:09 PM ATTENDING ATTESTATION: I have reviewed the history with the patient and examined the patient.  I have reviewed the resident's note and agree with the assessment and plan. I have personally spent 30 minutes involved in face-to-face and non-face-to-face activities for this patient on the day of the visit. -------------------------------------------------------------------------------  RADIATION ONCOLOGY FOLLOW-UP VISIT: 04/15/2024  Oncology History  Non-small cell cancer of left lung (HCC)  06/21/2015 Initial Diagnosis   Non-small cell cancer of left lung (HCC)   07/12/2015 -  Chemotherapy   Started treatment on [facility] 62415. Randomized to Pembrolizumab only treatment arm.   06/25/2023 Relapse   CTA ([place]) - 1.9 cm irregular potential cavitary nodule in the right lower lobe in addition to 9 mm right upper lobe groundglass opacity   07/2023 Imaging   07/31/23:  CT Chest wo - increased size and density of multiple pulmonary nodules compared to prior CT 01/31/21; dominant nodule in the right lower lobe measuring up to 22 mm (302/199) concerning for recurrent lung cancer as well as part solid pulmonary nodule in the anterior right upper lobe measuring 10 mm (new from 01/31/2021) as well as stable, previously visualized  groundglass opacity along the posterior right upper lobe (302/111).  Additionally, increased size/density of nodules in right middle lobe measuring 7 mm  (302/170)   08/28/23:  PET/CT-  solid hypermetabolic right lower lobe lung nodule measuring 2.1 cm with SUV max 6;  additioanl  suspicious subcentimeter right upper lobe part solid cavitary pulmonary nodule with mild FDG avidity SUV max 1.0 (image 72)   Malignant neoplasm metastatic to right lung    (CMD)  10/02/2023 Initial Diagnosis   Malignant neoplasm metastatic to right lung  (CMD)   10/09/2023 -  Radiation   RADIATION Treatment Details (Noted on 10/02/2023) Site: Right Lung Technique: SBRT Goal: Curative Planned Treatment Start Date: 10/09/2023 SBRT: Right Lung   Treatment Period Technique Fraction Dose Fractions Total Dose  Course 1 11/30/2023-12/20/2023  (days elapsed: 20)        RT lower lung 11/30/2023-12/20/2023 SBRT 1,000 / 1,000 cGy 5 / 5 5,000 / 5,000 cGy        Right upper lung 11/30/2023-12/20/2023 SBRT 1,000 / 1,000 cGy 5 / 5 5,000 / 5,000 cGy    10/09/2023 -  Radiation   RADIATION Treatment Details (Noted on 10/02/2023) Site: Right Lung Technique: SBRT Goal: Curative Planned Treatment Start Date: 10/09/2023 No radiation treatments to show. (Treatments may have been administered in another system.)      Interval: 4 months since last visit, 4 months since completion of therapy  Interval History: Mr. Bonenberger presents today for regularly scheduled follow-up. He is doing well with no new issues or concerns. He denies new or worsening SOB, chest pain, oxygen  requirements but has endorses some rib pain that occurs occasionally. He presents today after completion of imaging.   Medications Ordered Prior to Encounter[1]  Allergies: Allergies[2]  Physical Exam: BP 120/73 (BP Location: Left arm, Patient Position: Sitting)   Pulse 64   Temp 98 F (36.7 C) (Temporal)   Resp 18   SpO2 100%   General: ECOG: 2 - Capable of selfcare but unable to carry out any work activities. Up and about > 50 percent of waking hours KPS (60-50%). Patient sitting comfortably in exam chair in no acute distress. HEENT: No palpable lymphadenopathy. Moist oral mucousa.  Cardio: Normal rate, regular rhythm. No rubs, murmurs, or gallops. Respiratory:  Lungs clear to auscultation bilaterally Neuro: Cranial nerves II-XII intact grossly   Labs:  No results found for this or any previous visit (from the past week).  Radiology:  CT CHEST WO (04/15/2024): Good response to RLL  lesion however unclear response to RUL lesion. Will wait for formal report.   Assessment:  Mr. Lamphier is a 63 yo M with presumed R. Lung recurrence of left-lung metastatic NSCLC vs multiple synchronous primary right lung NSCLC in both RUL and RLL s/p SBRT with COT 12/2023 who presents for regularly scheduled follow up with imaging. Imaging reveals good response to RLL lesion however will need to follow up on RUL lesion with formal radiology report.   Plan:  1. Will plan for 3 month follow up with CT CHEST same day tentatively pending formal imaging results             [1] Current Outpatient Medications on File Prior to Encounter  Medication Sig Dispense Refill  . acetaminophen  (TYLENOL ) 325 mg tablet Take 650 mg by mouth.    . albuterol  HFA (Ventolin  HFA) 90 mcg/actuation inhaler Inhale 180 mcg every 4 (four) hours as needed.    . budesonide -glycopyr-formoterol  (Breztri  Aerosphere) 160-9-4.8 mcg/actuation HFAA Inhale 2 puffs 2 (two) times a day.    . clonazePAM  (KlonoPIN ) 1 mg tablet Take 1 mg by mouth 2 (two) times a day.    . furosemide  (LASIX ) 20 mg tablet 1 tablet.    . guaiFENesin  (MUCINEX ) 600 mg 12 hr tablet Take 600 mg by mouth.    . hydroCHLOROthiazide  (HYDRODIURIL ) 25 mg tablet TAKE 1 TABLET BY MOUTH EVERY DAY IN THE MORNING for 90    . ipratropium-albuteroL  (DUO-NEB) 0.5-2.5 mg/3 mL nebulizer solution 3 mL.    . LORazepam  (ATIVAN ) 0.5 mg tablet Take 0.5 mg by mouth.    . omeprazole  (PriLOSEC) 40 mg DR capsule Take 40 mg by mouth Once Daily. 30 capsule 5  . oxyCODONE  (ROXICODONE ) 15 mg immediate release tablet 1 tablet.    . predniSONE  (DELTASONE ) 20 mg tablet      No current facility-administered medications on file prior to encounter.  [2] Allergies Allergen Reactions  . Ibuprofen  Shortness Of Breath

## 2024-05-18 ENCOUNTER — Other Ambulatory Visit: Payer: Self-pay

## 2024-05-18 ENCOUNTER — Encounter (HOSPITAL_COMMUNITY): Payer: Self-pay | Admitting: Family Medicine

## 2024-05-18 ENCOUNTER — Emergency Department (HOSPITAL_COMMUNITY)

## 2024-05-18 ENCOUNTER — Inpatient Hospital Stay (HOSPITAL_COMMUNITY)
Admission: EM | Admit: 2024-05-18 | Discharge: 2024-05-20 | DRG: 190 | Disposition: A | Discharge status: Home / Self Care | Attending: Family Medicine | Admitting: Family Medicine

## 2024-05-18 DIAGNOSIS — C799 Secondary malignant neoplasm of unspecified site: Secondary | ICD-10-CM | POA: Diagnosis present

## 2024-05-18 DIAGNOSIS — J441 Chronic obstructive pulmonary disease with (acute) exacerbation: Secondary | ICD-10-CM | POA: Diagnosis not present

## 2024-05-18 DIAGNOSIS — G8929 Other chronic pain: Secondary | ICD-10-CM | POA: Diagnosis present

## 2024-05-18 DIAGNOSIS — E669 Obesity, unspecified: Secondary | ICD-10-CM | POA: Diagnosis present

## 2024-05-18 DIAGNOSIS — J9620 Acute and chronic respiratory failure, unspecified whether with hypoxia or hypercapnia: Secondary | ICD-10-CM | POA: Diagnosis present

## 2024-05-18 DIAGNOSIS — Z79899 Other long term (current) drug therapy: Secondary | ICD-10-CM

## 2024-05-18 DIAGNOSIS — I5032 Chronic diastolic (congestive) heart failure: Secondary | ICD-10-CM | POA: Diagnosis present

## 2024-05-18 DIAGNOSIS — R0602 Shortness of breath: Secondary | ICD-10-CM | POA: Diagnosis not present

## 2024-05-18 DIAGNOSIS — Z7951 Long term (current) use of inhaled steroids: Secondary | ICD-10-CM

## 2024-05-18 DIAGNOSIS — C3491 Malignant neoplasm of unspecified part of right bronchus or lung: Secondary | ICD-10-CM | POA: Diagnosis present

## 2024-05-18 DIAGNOSIS — K219 Gastro-esophageal reflux disease without esophagitis: Secondary | ICD-10-CM | POA: Diagnosis present

## 2024-05-18 DIAGNOSIS — J9622 Acute and chronic respiratory failure with hypercapnia: Secondary | ICD-10-CM | POA: Diagnosis present

## 2024-05-18 DIAGNOSIS — Z87891 Personal history of nicotine dependence: Secondary | ICD-10-CM

## 2024-05-18 DIAGNOSIS — F17200 Nicotine dependence, unspecified, uncomplicated: Secondary | ICD-10-CM | POA: Diagnosis present

## 2024-05-18 DIAGNOSIS — Z66 Do not resuscitate: Secondary | ICD-10-CM | POA: Diagnosis present

## 2024-05-18 DIAGNOSIS — Z9981 Dependence on supplemental oxygen: Secondary | ICD-10-CM

## 2024-05-18 DIAGNOSIS — J9621 Acute and chronic respiratory failure with hypoxia: Secondary | ICD-10-CM | POA: Diagnosis present

## 2024-05-18 DIAGNOSIS — F419 Anxiety disorder, unspecified: Secondary | ICD-10-CM | POA: Diagnosis present

## 2024-05-18 DIAGNOSIS — I10 Essential (primary) hypertension: Secondary | ICD-10-CM | POA: Diagnosis present

## 2024-05-18 DIAGNOSIS — Z1152 Encounter for screening for COVID-19: Secondary | ICD-10-CM

## 2024-05-18 DIAGNOSIS — I110 Hypertensive heart disease with heart failure: Secondary | ICD-10-CM | POA: Diagnosis present

## 2024-05-18 DIAGNOSIS — F1410 Cocaine abuse, uncomplicated: Secondary | ICD-10-CM | POA: Diagnosis present

## 2024-05-18 LAB — COMPREHENSIVE METABOLIC PANEL WITH GFR
ALT: 15 U/L (ref 0–44)
AST: 19 U/L (ref 15–41)
Albumin: 3.4 g/dL — ABNORMAL LOW (ref 3.5–5.0)
Alkaline Phosphatase: 83 U/L (ref 38–126)
Anion gap: 11 (ref 5–15)
BUN: 13 mg/dL (ref 8–23)
CO2: 38 mmol/L — ABNORMAL HIGH (ref 22–32)
Calcium: 9.2 mg/dL (ref 8.9–10.3)
Chloride: 95 mmol/L — ABNORMAL LOW (ref 98–111)
Creatinine, Ser: 0.75 mg/dL (ref 0.61–1.24)
GFR, Estimated: >60 mL/min (ref >60)
Glucose, Bld: 101 mg/dL — ABNORMAL HIGH (ref 70–99)
Potassium: 4.1 mmol/L (ref 3.5–5.1)
Sodium: 144 mmol/L (ref 135–145)
Total Bilirubin: 0.6 mg/dL (ref 0.0–1.2)
Total Protein: 6.9 g/dL (ref 6.5–8.1)

## 2024-05-18 LAB — CBC WITH DIFFERENTIAL/PLATELET
Abs Immature Granulocytes: 0.02 K/uL (ref 0.00–0.07)
Basophils Absolute: 0 K/uL (ref 0.0–0.1)
Basophils Relative: 1 %
Eosinophils Absolute: 0.3 K/uL (ref 0.0–0.5)
Eosinophils Relative: 6 %
HCT: 42.5 % (ref 39.0–52.0)
Hemoglobin: 12.5 g/dL — ABNORMAL LOW (ref 13.0–17.0)
Immature Granulocytes: 0 %
Lymphocytes Relative: 25 %
Lymphs Abs: 1.3 K/uL (ref 0.7–4.0)
MCH: 29.2 pg (ref 26.0–34.0)
MCHC: 29.4 g/dL — ABNORMAL LOW (ref 30.0–36.0)
MCV: 99.3 fL (ref 80.0–100.0)
Monocytes Absolute: 0.7 K/uL (ref 0.1–1.0)
Monocytes Relative: 14 %
Neutro Abs: 2.8 K/uL (ref 1.7–7.7)
Neutrophils Relative %: 54 %
Platelets: 197 K/uL (ref 150–400)
RBC: 4.28 MIL/uL (ref 4.22–5.81)
RDW: 12.6 % (ref 11.5–15.5)
WBC: 5.1 K/uL (ref 4.0–10.5)
nRBC: 0 % (ref 0.0–0.2)

## 2024-05-18 LAB — RESPIRATORY PANEL BY PCR

## 2024-05-18 LAB — BLOOD GAS, VENOUS
Acid-Base Excess: 17.5 mmol/L — ABNORMAL HIGH (ref 0.0–2.0)
Bicarbonate: 48.8 mmol/L — ABNORMAL HIGH (ref 20.0–28.0)
Drawn by: 44828
O2 Saturation: 71.8 %
Patient temperature: 37.2
pCO2, Ven: 98 mmHg (ref 44–60)
pH, Ven: 7.31 (ref 7.25–7.43)
pO2, Ven: 45 mmHg (ref 32–45)

## 2024-05-18 LAB — BRAIN NATRIURETIC PEPTIDE: B Natriuretic Peptide: 24 pg/mL (ref 0.0–100.0)

## 2024-05-18 LAB — LACTIC ACID, PLASMA
Lactic Acid, Venous: 1 mmol/L (ref 0.5–1.9)
Lactic Acid, Venous: 1.4 mmol/L (ref 0.5–1.9)

## 2024-05-18 LAB — PROCALCITONIN: Procalcitonin: <0.1 ng/mL

## 2024-05-18 LAB — TROPONIN I (HIGH SENSITIVITY): Troponin I (High Sensitivity): 6 ng/L (ref <18)

## 2024-05-18 MED ORDER — ACETAMINOPHEN 325 MG PO TABS
650.0000 mg | ORAL_TABLET | Freq: Four times a day (QID) | ORAL | Status: DC | PRN
Start: 2024-05-18 — End: 2024-05-20
  Administered 2024-05-18: 650 mg via ORAL
  Filled 2024-05-18: qty 2

## 2024-05-18 MED ORDER — PREDNISONE 20 MG PO TABS
40.0000 mg | ORAL_TABLET | Freq: Every day | ORAL | Status: DC
  Filled 2024-05-18: qty 2

## 2024-05-18 MED ORDER — ONDANSETRON HCL 4 MG PO TABS: 4.0000 mg | ORAL_TABLET | Freq: Four times a day (QID) | ORAL | Status: DC | PRN

## 2024-05-18 MED ORDER — CLONAZEPAM 0.5 MG PO TABS
1.0000 mg | ORAL_TABLET | Freq: Two times a day (BID) | ORAL | Status: DC | PRN
  Administered 2024-05-19: 1 mg via ORAL
  Filled 2024-05-18: qty 2

## 2024-05-18 MED ORDER — ENOXAPARIN SODIUM 40 MG/0.4ML IJ SOSY
40.0000 mg | PREFILLED_SYRINGE | INTRAMUSCULAR | Status: DC
  Administered 2024-05-18 – 2024-05-19 (×2): 40 mg via SUBCUTANEOUS
  Filled 2024-05-18 (×2): qty 0.4

## 2024-05-18 MED ORDER — ALBUTEROL SULFATE (2.5 MG/3ML) 0.083% IN NEBU: 2.5000 mg | INHALATION_SOLUTION | RESPIRATORY_TRACT | Status: DC | PRN

## 2024-05-18 MED ORDER — AZITHROMYCIN 250 MG PO TABS: 500.0000 mg | ORAL_TABLET | Freq: Every day | ORAL | Status: DC

## 2024-05-18 MED ORDER — SODIUM CHLORIDE 0.9 % IV SOLN
500.0000 mg | INTRAVENOUS | Status: AC
  Administered 2024-05-18: 500 mg via INTRAVENOUS
  Filled 2024-05-18: qty 5

## 2024-05-18 MED ORDER — GUAIFENESIN ER 600 MG PO TB12
600.0000 mg | ORAL_TABLET | Freq: Two times a day (BID) | ORAL | Status: DC
  Administered 2024-05-18 – 2024-05-20 (×5): 600 mg via ORAL
  Filled 2024-05-18 (×5): qty 1

## 2024-05-18 MED ORDER — BUDESON-GLYCOPYRROL-FORMOTEROL 160-9-4.8 MCG/ACT IN AERO
2.0000 | INHALATION_SPRAY | Freq: Two times a day (BID) | RESPIRATORY_TRACT | Status: DC
  Administered 2024-05-18 – 2024-05-20 (×4): 2 via RESPIRATORY_TRACT
  Filled 2024-05-18: qty 5.9

## 2024-05-18 MED ORDER — ACETAMINOPHEN 650 MG RE SUPP
650.0000 mg | Freq: Four times a day (QID) | RECTAL | Status: DC | PRN
Start: 2024-05-18 — End: 2024-05-20

## 2024-05-18 MED ORDER — ALBUTEROL SULFATE (2.5 MG/3ML) 0.083% IN NEBU
10.0000 mg | INHALATION_SOLUTION | RESPIRATORY_TRACT | Status: AC
  Administered 2024-05-18: 10 mg via RESPIRATORY_TRACT
  Filled 2024-05-18 (×2): qty 12

## 2024-05-18 MED ORDER — ALBUTEROL SULFATE (2.5 MG/3ML) 0.083% IN NEBU
2.5000 mg | INHALATION_SOLUTION | Freq: Four times a day (QID) | RESPIRATORY_TRACT | Status: DC
  Administered 2024-05-18 – 2024-05-19 (×5): 2.5 mg via RESPIRATORY_TRACT
  Filled 2024-05-18 (×5): qty 3

## 2024-05-18 MED ORDER — METHYLPREDNISOLONE SODIUM SUCC 40 MG IJ SOLR
40.0000 mg | Freq: Two times a day (BID) | INTRAMUSCULAR | Status: AC
  Administered 2024-05-18: 40 mg via INTRAVENOUS
  Filled 2024-05-18: qty 1

## 2024-05-18 MED ORDER — METHYLPREDNISOLONE SODIUM SUCC 125 MG IJ SOLR
125.0000 mg | Freq: Once | INTRAMUSCULAR | Status: AC
  Administered 2024-05-18: 125 mg via INTRAVENOUS
  Filled 2024-05-18: qty 2

## 2024-05-18 MED ORDER — NICOTINE 21 MG/24HR TD PT24
21.0000 mg | MEDICATED_PATCH | Freq: Every day | TRANSDERMAL | Status: DC
  Filled 2024-05-18: qty 1

## 2024-05-18 MED ORDER — HYDROCHLOROTHIAZIDE 25 MG PO TABS
25.0000 mg | ORAL_TABLET | Freq: Every morning | ORAL | Status: DC
  Administered 2024-05-18 – 2024-05-20 (×3): 25 mg via ORAL
  Filled 2024-05-18 (×2): qty 1

## 2024-05-18 MED ORDER — PANTOPRAZOLE SODIUM 40 MG PO TBEC
40.0000 mg | DELAYED_RELEASE_TABLET | Freq: Every day | ORAL | Status: DC
  Administered 2024-05-18 – 2024-05-20 (×3): 40 mg via ORAL
  Filled 2024-05-18 (×3): qty 1

## 2024-05-18 MED ORDER — TRAZODONE HCL 50 MG PO TABS: 50.0000 mg | ORAL_TABLET | Freq: Every evening | ORAL | Status: DC | PRN

## 2024-05-18 MED ORDER — ALBUTEROL SULFATE (2.5 MG/3ML) 0.083% IN NEBU
10.0000 mg | INHALATION_SOLUTION | RESPIRATORY_TRACT | Status: AC
  Administered 2024-05-18: 10 mg via RESPIRATORY_TRACT
  Filled 2024-05-18: qty 12

## 2024-05-18 MED ORDER — ONDANSETRON HCL 4 MG/2ML IJ SOLN: 4.0000 mg | Freq: Four times a day (QID) | INTRAMUSCULAR | Status: DC | PRN

## 2024-05-18 NOTE — ED Provider Notes (Signed)
 [place] EMERGENCY DEPARTMENT AT [facility] Provider Note   CSN: [id number] Arrival date & time: 05/18/24  9278     Patient presents with: Shortness of Breath (C/o SOB that has been gradually increasing over the last week. Patient received nebulizer treatment this am around 630 with some relief noted. Patient O2 Sat 98% on room air but still feels SOB, O2 2L [place] applied for comfort. )   Miguel Marsh is a 63 y.o. male.  {Add pertinent medical, surgical, social history, OB history to HPI:32947}  Shortness of Breath    This patient is a 63 year old male with a known history of COPD, the patient has a history of nebulizer and albuterol  inhaler MDI use, he is on 3 L of oxygen  chronically.  He also has a history of hypertension, the patient was last admitted to the hospital approximately 1-1/2 months ago, admitted a month before that, 2 months before that and 2 weeks before that.  He is frequently admitted to the hospital secondary to his COPD.  He presents today complaining of increasing shortness of breath which has been getting worse over couple of weeks but overnight became worse.  He was feeling more dyspneic and did not feel any relief with his albuterol .  He was arrived by paramedic transport who noted him to be tachypneic and placed him on oxygen , no meds given on the ambulance  Prior to Admission medications   Medication Sig Start Date End Date Taking? Authorizing Provider  acetaminophen  (TYLENOL ) 325 MG tablet Take 2 tablets (650 mg total) by mouth every 6 (six) hours as needed for mild pain (pain score 1-3) (or Fever >/= 101). 01/31/24   Pearlean Manus, MD  albuterol  (PROVENTIL ) (2.5 MG/3ML) 0.083% nebulizer solution Inhale 3 mLs (2.5 mg total) into the lungs every 6 (six) hours as needed for wheezing or shortness of breath. 01/31/24   Pearlean Manus, MD  albuterol  (VENTOLIN  HFA) 108 (90 Base) MCG/ACT inhaler Inhale 2 puffs into the lungs every 6 (six) hours as needed for  shortness of breath or wheezing (Cough). 01/31/24   Pearlean Manus, MD  BREZTRI  AEROSPHERE 160-9-4.8 MCG/ACT AERO Inhale 2 puffs into the lungs 2 (two) times daily. 01/31/24   Pearlean Manus, MD  clonazePAM  (KLONOPIN ) 1 MG tablet Take 1 tablet (1 mg total) by mouth 2 (two) times daily as needed for anxiety. 11/01/23   Pearlean Manus, MD  furosemide  (LASIX ) 40 MG tablet Take 0.5 tablets (20 mg total) by mouth See admin instructions. Take once a day  as needed for fluid/swelling Patient taking differently: Take 20 mg by mouth daily as needed for fluid or edema. 11/01/23   Pearlean Manus, MD  guaiFENesin  (MUCINEX ) 600 MG 12 hr tablet Take 1 tablet (600 mg total) by mouth 2 (two) times daily. 01/31/24 01/30/25  Pearlean Manus, MD  hydrochlorothiazide  (HYDRODIURIL ) 25 MG tablet Take 25 mg by mouth every morning.    [provider]  methylPREDNISolone  (MEDROL  DOSEPAK) 4 MG TBPK tablet Medrol  Dosepak take as instructed 04/03/24   Shahmehdi, Adriana A, MD  nicotine  (NICODERM CQ  - DOSED IN MG/24 HOURS) 21 mg/24hr patch Place 21 mg onto the skin daily. 02/06/24   [provider]  omeprazole  (PRILOSEC) 40 MG capsule Take 1 capsule (40 mg total) by mouth in the morning and at bedtime. 03/06/24   Ricky Fines, MD  OXYGEN  Inhale 3 L into the lungs continuous.     [provider]  traZODone  (DESYREL ) 50 MG tablet Take 1 tablet (  50 mg total) by mouth at bedtime as needed for sleep. 01/31/24   Pearlean Manus, MD    Allergies: Ibuprofen     Review of Systems  Respiratory:  Positive for shortness of breath.   All other systems reviewed and are negative.   Updated Vital Signs BP 129/80 (BP Location: Left Arm)   Pulse 81   Temp 98.9 F (37.2 C) (Oral)   Resp 19   Ht 1.676 m (5' 6)   SpO2 95%   BMI 31.10 kg/m   Physical Exam Vitals and nursing note reviewed.  Constitutional:      General: He is not in acute distress.    Appearance: He is well-developed.  HENT:     Head:  Normocephalic and atraumatic.     Mouth/Throat:     Pharynx: No oropharyngeal exudate.  Eyes:     General: No scleral icterus.       Right eye: No discharge.        Left eye: No discharge.     Conjunctiva/sclera: Conjunctivae normal.     Pupils: Pupils are equal, round, and reactive to light.  Neck:     Thyroid: No thyromegaly.     Vascular: No JVD.  Cardiovascular:     Rate and Rhythm: Normal rate and regular rhythm.     Heart sounds: Normal heart sounds. No murmur heard.    No friction rub. No gallop.  Pulmonary:     Effort: Respiratory distress present.     Breath sounds: Wheezing present. No rales.  Abdominal:     General: Bowel sounds are normal. There is no distension.     Palpations: Abdomen is soft. There is no mass.     Tenderness: There is no abdominal tenderness.  Musculoskeletal:        General: No tenderness. Normal range of motion.     Cervical back: Normal range of motion and neck supple.     Right lower leg: No edema.     Left lower leg: No edema.  Lymphadenopathy:     Cervical: No cervical adenopathy.  Skin:    General: Skin is warm and dry.     Findings: No erythema or rash.  Neurological:     Mental Status: He is alert.     Coordination: Coordination normal.  Psychiatric:        Behavior: Behavior normal.     (all labs ordered are listed, but only abnormal results are displayed) Labs Reviewed - No data to display  EKG: None  Radiology: No results found.  {Document cardiac monitor, telemetry assessment procedure when appropriate:32947} Procedures   Medications Ordered in the ED - No data to display    {Click here for ABCD2, HEART and other calculators REFRESH Note before signing:1}                              Medical Decision Making   This patient presents to the ED for concern of increasing shortness of breath, this involves an extensive number of treatment options, and is a complaint that carries with it a high risk of complications  and morbidity.  The differential diagnosis includes COPD, CHF, pneumothorax, pneumonia   Co morbidities / Chronic conditions that complicate the patient evaluation  Chronic COPD, oxygen  dependent   Additional history obtained:  Additional history obtained from EMR External records from outside source obtained and reviewed including prior admissions to hospital   Lab Tests:  I Ordered, and personally interpreted labs.  The pertinent results include:  ***   Imaging Studies ordered:  I ordered imaging studies including ***  I independently visualized and interpreted imaging which showed *** I agree with the radiologist interpretation   Cardiac Monitoring: / EKG:  The patient was maintained on a cardiac monitor.  I personally viewed and interpreted the cardiac monitored which showed an underlying rhythm of: ***   Problem List / ED Course / Critical interventions / Medication management  *** I ordered medication including ***   Reevaluation of the patient after these medicines showed that the patient *** I have reviewed the patients home medicines and have made adjustments as needed   Consultations Obtained:  I requested consultation with the ***,  and discussed lab and imaging findings as well as pertinent plan - they recommend: ***   Social Determinants of Health:  ***   Test / Admission - Considered:  ***   {Document critical care time when appropriate  Document review of labs and clinical decision tools ie CHADS2VASC2, etc  Document your independent review of radiology images and any outside records  Document your discussion with family members, caretakers and with consultants  Document social determinants of health affecting pt's care  Document your decision making why or why not admission, treatments were needed:32947:::1}   Final diagnoses:  None    ED Discharge Orders     None

## 2024-05-18 NOTE — Plan of Care (Signed)
   Problem: Education: Goal: Knowledge of General Education information will improve Description: Including pain rating scale, medication(s)/side effects and non-pharmacologic comfort measures Outcome: Progressing   Problem: Clinical Measurements: Goal: Ability to maintain clinical measurements within normal limits will improve Outcome: Progressing Goal: Diagnostic test results will improve Outcome: Progressing

## 2024-05-18 NOTE — Progress Notes (Signed)
 Patient arrived to unit via wc , a/o x 3 in no respiratory distress. Patient ambulatory with steady gait. Patient admits to smoking 3 cigarettes a day and noncompliance with home oxygen  use. Patient sits off side of bed in tripod position with slow expirations and pursed lip breathing. Respiratory rate 20 lungs expiratory wheezes with rhonchi heard bilaterally.. Patient unwilling to change into gown. Patient also refuses nicotine  patch stating  every time I wear one of them patches I dream of dead people. Dont put that on me

## 2024-05-18 NOTE — H&P (Signed)
 History and Physical    Patient: Miguel Marsh FMW:[id number] DOB: [birth year] DOA: 05/18/2024 DOS: the patient was seen and examined on 05/18/2024 PCP: Renato Dorothey HERO, NP  Patient coming from: Home  Chief Complaint:  Chief Complaint  Patient presents with   Shortness of Breath    C/o SOB that has been gradually increasing over the last week. Patient received nebulizer treatment this am around 630 with some relief noted. Patient O2 Sat 98% on room air but still feels SOB, O2 2L [place] applied for comfort.    HPI: Miguel Marsh is a 63 y.o. male with medical history significant of 2L O2-dependent COPD with frequent readmissions for AECOPD (8 admissions in past 12 months), metastatic NSCLC, cocaine use, HTN who presented to the [facility] today by EMS for worsening dyspnea. He's had increasing wheezing incompletely responsive to albuterol  at home, no chest pain, no sick contacts, no fevers, no palpitations, no leg swelling, no change in orthopnea. Feels consistent with prior COPD exacerbations.   In the ED he was given IV steroids and duonebs, then continuous albuterol  with improvement in symptoms but still desaturated into mid-80%'s when ambulating so admission was requested.   Review of Systems: As mentioned in the history of present illness. All other systems reviewed and are negative. Past Medical History:  Diagnosis Date   Arthritis    Bronchitis    Cancer (HCC)    metastatic NSCLC (followed by [facility])   COPD (chronic obstructive pulmonary disease) (HCC)    HTN (hypertension)    Past Surgical History:  Procedure Laterality Date   LUNG BIOPSY     TOTAL HIP ARTHROPLASTY     TUMOR REMOVAL Right 12/04/2017   Social History:  reports that he has quit smoking. His smoking use included cigarettes. He has never used smokeless tobacco. He reports current alcohol  use. He reports current drug use. Drugs: Marijuana and Cocaine.  Allergies  Allergen Reactions   Ibuprofen  Shortness Of Breath     No family history on file.  Prior to Admission medications   Medication Sig Start Date End Date Taking? Authorizing Provider  acetaminophen  (TYLENOL ) 325 MG tablet Take 2 tablets (650 mg total) by mouth every 6 (six) hours as needed for mild pain (pain score 1-3) (or Fever >/= 101). 01/31/24   Pearlean Manus, MD  albuterol  (PROVENTIL ) (2.5 MG/3ML) 0.083% nebulizer solution Inhale 3 mLs (2.5 mg total) into the lungs every 6 (six) hours as needed for wheezing or shortness of breath. 01/31/24   Pearlean Manus, MD  albuterol  (VENTOLIN  HFA) 108 (90 Base) MCG/ACT inhaler Inhale 2 puffs into the lungs every 6 (six) hours as needed for shortness of breath or wheezing (Cough). 01/31/24   Pearlean Manus, MD  BREZTRI  AEROSPHERE 160-9-4.8 MCG/ACT AERO Inhale 2 puffs into the lungs 2 (two) times daily. 01/31/24   Pearlean Manus, MD  clonazePAM  (KLONOPIN ) 1 MG tablet Take 1 tablet (1 mg total) by mouth 2 (two) times daily as needed for anxiety. 11/01/23   Pearlean Manus, MD  furosemide  (LASIX ) 40 MG tablet Take 0.5 tablets (20 mg total) by mouth See admin instructions. Take once a day  as needed for fluid/swelling Patient taking differently: Take 20 mg by mouth daily as needed for fluid or edema. 11/01/23   Pearlean Manus, MD  guaiFENesin  (MUCINEX ) 600 MG 12 hr tablet Take 1 tablet (600 mg total) by mouth 2 (two) times daily. 01/31/24 01/30/25  Pearlean Manus, MD  hydrochlorothiazide  (HYDRODIURIL ) 25 MG tablet Take 25 mg by  mouth every morning.    [provider]  methylPREDNISolone  (MEDROL  DOSEPAK) 4 MG TBPK tablet Medrol  Dosepak take as instructed 04/03/24   Shahmehdi, Adriana A, MD  nicotine  (NICODERM CQ  - DOSED IN MG/24 HOURS) 21 mg/24hr patch Place 21 mg onto the skin daily. 02/06/24   [provider]  omeprazole  (PRILOSEC) 40 MG capsule Take 1 capsule (40 mg total) by mouth in the morning and at bedtime. 03/06/24   Ricky Fines, MD  OXYGEN  Inhale 3 L into the lungs continuous.     [provider]  traZODone  (DESYREL ) 50 MG tablet Take 1 tablet (50 mg total) by mouth at bedtime as needed for sleep. 01/31/24   Pearlean Manus, MD    Physical Exam: Vitals:   05/18/24 9262 05/18/24 0739 05/18/24 0759 05/18/24 1136  BP: 129/80     Pulse: 84  81   Resp:   19   Temp: 98.9 F (37.2 C)   (!) 97.5 F (36.4 C)  TempSrc: Oral   Oral  SpO2: (!) 2%  95%   Height:  5' 6 (1.676 m)    Gen: no distress, resting calmly Pulm: Speaking full sentences, normal rate, no retractions, pan-expiratory wheezing diffusely without crackles.  CV: RRR, no MRG or edema GI: Soft, NT, ND, +BS  Neuro: Alert and oriented. No new focal deficits. Ext: Warm, no deformities. Skin: No rashes, lesions or ulcers on visualized skin   Data Reviewed: ECG personally reviewed: NSR w/vent rate 81bpm, normal axis, isolated T wave flattening in aVL, otherwise normal.  CXR: personally reviewed: Agree with radiologist that there are streaky opacities bilaterally. L > R. No consolidation, effusion, or masses.  WBC 5.1k, normal diff Hgb 12.5g/dl, normal indices Lactic acid 1 > 1.4 BNP 24, troponin 6 Bicarb 38, albumin 3.4, otherwise not very remarkable CMP.  VBG: 7.31 / 98   Assessment and Plan: Acute on chronic hypoxic respiratory failure: due to AECOPD. Desaturated on home oxygen  with ambulation in ED.  - Continue supplemental oxygen  to maintain normal WOB and SpO2 >88%.   AECOPD:  - VBG w/normal pH and pCO2 of 98 (values since 2021 have all been near this, between 81 and >120). No distress, plan to admit to 300. - Check viral panel - Check PCT - Empiric azithromycin  - Continue controller Breztri  - Continue scheduled albuterol  - Continue IV steroids - IS/FV/mucolytic.   History of cocaine use: Last UDS in May 2025 was negative. Was positive March 2025, Dec 2024.  - Cessation counseling provided.  - Will just plan to avoid beta blockers given this and bronchospasm.   Tobacco use:  - Cessation  counseling provided  HTN, chronic HFpEF: - Continue HCTZ 25mg   - Avoid beta blocker   Anxiety:  - Continue clonazepam  at 1mg  dose prescribed consistently (last on 7/18, regularly 60 tabs / 30-day supply per PDMP review)   Chronic pain:  - Continue oxycodone  (regular fill of oxyIR 15mg  #90 for 30-day supply Rx by Dorothey Cassette, DNP last on 7/16 confirmed by PDMP review at admission).    Metastatic NSCLC: s/p pembrolizumab 2016 for left lung CA > remission, relapse in RUL and RLL (synchronous primary vs. metastatic) Aug 2024 RLL underwent SBRT Dec 2024 - Feb 2025. - Continue regular follow up with oncology in [facility], Dr. Missy.   GERD:  - Continue PPI  Obesity:  - Weight loss recommended, consider sleep study.   Advance Care Planning: DNR/DNI  Consults: None  Family Communication: None at bedside  Severity of Illness: The appropriate patient status for this patient is OBSERVATION. Observation status is judged to be reasonable and necessary in order to provide the required intensity of service to ensure the patient's safety. The patient's presenting symptoms, physical exam findings, and initial radiographic and laboratory data in the context of their medical condition is felt to place them at decreased risk for further clinical deterioration. Furthermore, it is anticipated that the patient will be medically stable for discharge from the hospital within 2 midnights of admission.   Author: Bernardino KATHEE Come, MD 05/18/2024 12:04 PM  For on call review [email or web address].

## 2024-05-18 NOTE — ED Triage Notes (Signed)
 Patient brought in by EMS [facility] with c/o SOB that has gradually increased over last week. O2 Sats 98% on room air but still c/o SOB. O2 2L n/c applied for comfort. Lung sounds with rhonchi in upper fields.

## 2024-05-19 DIAGNOSIS — F1410 Cocaine abuse, uncomplicated: Secondary | ICD-10-CM | POA: Diagnosis present

## 2024-05-19 DIAGNOSIS — I5032 Chronic diastolic (congestive) heart failure: Secondary | ICD-10-CM | POA: Diagnosis present

## 2024-05-19 DIAGNOSIS — J9622 Acute and chronic respiratory failure with hypercapnia: Secondary | ICD-10-CM | POA: Diagnosis present

## 2024-05-19 DIAGNOSIS — J441 Chronic obstructive pulmonary disease with (acute) exacerbation: Secondary | ICD-10-CM | POA: Diagnosis present

## 2024-05-19 DIAGNOSIS — Z79899 Other long term (current) drug therapy: Secondary | ICD-10-CM | POA: Diagnosis not present

## 2024-05-19 DIAGNOSIS — Z1152 Encounter for screening for COVID-19: Secondary | ICD-10-CM | POA: Diagnosis not present

## 2024-05-19 DIAGNOSIS — C799 Secondary malignant neoplasm of unspecified site: Secondary | ICD-10-CM | POA: Diagnosis present

## 2024-05-19 DIAGNOSIS — Z87891 Personal history of nicotine dependence: Secondary | ICD-10-CM | POA: Diagnosis not present

## 2024-05-19 DIAGNOSIS — I110 Hypertensive heart disease with heart failure: Secondary | ICD-10-CM | POA: Diagnosis present

## 2024-05-19 DIAGNOSIS — J9621 Acute and chronic respiratory failure with hypoxia: Secondary | ICD-10-CM | POA: Diagnosis present

## 2024-05-19 DIAGNOSIS — Z66 Do not resuscitate: Secondary | ICD-10-CM | POA: Diagnosis present

## 2024-05-19 DIAGNOSIS — K219 Gastro-esophageal reflux disease without esophagitis: Secondary | ICD-10-CM | POA: Diagnosis present

## 2024-05-19 DIAGNOSIS — G8929 Other chronic pain: Secondary | ICD-10-CM | POA: Diagnosis present

## 2024-05-19 DIAGNOSIS — F419 Anxiety disorder, unspecified: Secondary | ICD-10-CM | POA: Diagnosis present

## 2024-05-19 DIAGNOSIS — C3491 Malignant neoplasm of unspecified part of right bronchus or lung: Secondary | ICD-10-CM | POA: Diagnosis present

## 2024-05-19 DIAGNOSIS — E669 Obesity, unspecified: Secondary | ICD-10-CM | POA: Diagnosis present

## 2024-05-19 DIAGNOSIS — Z9981 Dependence on supplemental oxygen: Secondary | ICD-10-CM | POA: Diagnosis not present

## 2024-05-19 DIAGNOSIS — R0602 Shortness of breath: Secondary | ICD-10-CM | POA: Diagnosis present

## 2024-05-19 DIAGNOSIS — Z7951 Long term (current) use of inhaled steroids: Secondary | ICD-10-CM | POA: Diagnosis not present

## 2024-05-19 MED ORDER — METHYLPREDNISOLONE SODIUM SUCC 40 MG IJ SOLR
40.0000 mg | Freq: Two times a day (BID) | INTRAMUSCULAR | Status: DC
  Administered 2024-05-19 – 2024-05-20 (×3): 40 mg via INTRAVENOUS
  Filled 2024-05-19 (×3): qty 1

## 2024-05-19 MED ORDER — ALBUTEROL SULFATE (2.5 MG/3ML) 0.083% IN NEBU
2.5000 mg | INHALATION_SOLUTION | Freq: Three times a day (TID) | RESPIRATORY_TRACT | Status: DC
  Administered 2024-05-20: 2.5 mg via RESPIRATORY_TRACT
  Filled 2024-05-19: qty 3

## 2024-05-19 MED ORDER — AZITHROMYCIN 250 MG PO TABS
250.0000 mg | ORAL_TABLET | Freq: Every day | ORAL | Status: DC
  Administered 2024-05-19 – 2024-05-20 (×2): 250 mg via ORAL
  Filled 2024-05-19 (×2): qty 1

## 2024-05-19 NOTE — Plan of Care (Signed)
  Problem: Education: Goal: Knowledge of General Education information will improve Description: Including pain rating scale, medication(s)/side effects and non-pharmacologic comfort measures Outcome: Adequate for Discharge   Problem: Clinical Measurements: Goal: Ability to maintain clinical measurements within normal limits will improve Outcome: Adequate for Discharge Goal: Diagnostic test results will improve Outcome: Adequate for Discharge

## 2024-05-19 NOTE — Plan of Care (Signed)
  Problem: Education: Goal: Knowledge of General Education information will improve Description: Including pain rating scale, medication(s)/side effects and non-pharmacologic comfort measures Outcome: Progressing   Problem: Clinical Measurements: Goal: Will remain free from infection Outcome: Progressing Goal: Diagnostic test results will improve Outcome: Progressing Goal: Respiratory complications will improve Outcome: Progressing   Problem: Respiratory: Goal: Ability to maintain a clear airway will improve Outcome: Progressing Goal: Levels of oxygenation will improve Outcome: Progressing

## 2024-05-19 NOTE — Plan of Care (Signed)
  Problem: Education: Goal: Knowledge of General Education information will improve Description: Including pain rating scale, medication(s)/side effects and non-pharmacologic comfort measures Outcome: Progressing   Problem: Clinical Measurements: Goal: Ability to maintain clinical measurements within normal limits will improve Outcome: Progressing Goal: Will remain free from infection Outcome: Progressing Goal: Diagnostic test results will improve Outcome: Progressing Goal: Respiratory complications will improve Outcome: Progressing Goal: Cardiovascular complication will be avoided Outcome: Progressing   Problem: Nutrition: Goal: Adequate nutrition will be maintained Outcome: Progressing   Problem: Coping: Goal: Level of anxiety will decrease Outcome: Progressing   Problem: Elimination: Goal: Will not experience complications related to bowel motility Outcome: Progressing Goal: Will not experience complications related to urinary retention Outcome: Progressing   Problem: Safety: Goal: Ability to remain free from injury will improve Outcome: Progressing   Problem: Education: Goal: Knowledge of disease or condition will improve Outcome: Progressing Goal: Knowledge of the prescribed therapeutic regimen will improve Outcome: Progressing Goal: Individualized Educational Video(s) Outcome: Progressing   Problem: Respiratory: Goal: Ability to maintain a clear airway will improve Outcome: Progressing Goal: Levels of oxygenation will improve Outcome: Progressing Goal: Ability to maintain adequate ventilation will improve Outcome: Progressing

## 2024-05-19 NOTE — Progress Notes (Signed)
 [facility] PROGRESS NOTE  THEDORE PICKEL (DOB: Jan 24, 1961) FMW:[id number] PCP: Renato Dorothey HERO, NP  Brief Narrative: Miguel Marsh is a 63 y.o. male with a history of 2L O2-dependent COPD with frequent readmissions for AECOPD (8 admissions in past 12 months), metastatic NSCLC, cocaine use, HTN who presented [facility] on [facility]/2024 with shortness of breath, wheezing, and subsequently admitted for recurrent AECOPD.   Subjective: Still wheezing, though does note overall improvement. No chest pain.   Objective: BP 123/64 (BP Location: Left Arm)   Pulse 91   Temp 98.2 F (36.8 C) (Oral)   Resp 20   Ht 5' 6 (1.676 m)   Wt 98 kg   SpO2 (!) 88%   BMI 34.87 kg/m   Gen: Obese male in no acute distress Pulm: Tachypneic speaking in shorter sentences but no gasping. End-expiratory wheezing noted with slight prolongation.   CV: RRR, no MRG GI: Soft, NT, ND, +BS  Neuro: Alert and oriented. No new focal deficits. Ext: Warm, no deformities Skin: No new rashes, lesions or ulcers on visualized skin   Assessment & Plan: Acute on chronic hypoxic and hypercarbic respiratory failure: due to AECOPD. Desaturated on home oxygen  with ambulation in ED. VBG w/normal pH and pCO2 of 98 (values since 2021 have all been near this, between 81 and >120). - Continue supplemental oxygen  to maintain normal WOB and SpO2 >88%.    AECOPD: RVP negative, PCT reassuring.  - Empiric azithromycin  - Continue controller Breztri  - Continue scheduled albuterol  - Continue IV steroids, deescalate to prednisone  if bronchospasm continues improving 7/22. Overall he's stabilizing but not able to discharge at this time.  - IS/FV/mucolytic.    History of cocaine use: Last UDS in May 2025 was negative. Was positive March 2025, Dec 2024.  - Cessation counseling provided.  - Will just plan to avoid beta blockers given this and bronchospasm.    Tobacco use:  - Cessation counseling provided   HTN, chronic HFpEF: -  Continue HCTZ 25mg   - Avoid beta blocker   Anxiety:  - Continue clonazepam  at 1mg  dose prescribed consistently (last on 7/18, regularly 60 tabs / 30-day supply per PDMP review)   Chronic pain:  - Continue oxycodone  (regular fill of oxyIR 15mg  #90 for 30-day supply Rx by Dorothey Renato, DNP last on 7/16 confirmed by PDMP review at admission).    Metastatic NSCLC: s/p pembrolizumab 2016 for left lung CA > remission, relapse in RUL and RLL (synchronous primary vs. metastatic) Aug 2024 RLL underwent SBRT Dec 2024 - Feb 2025. - Continue regular follow up with oncology in [facility], Dr. Missy.    GERD:  - Continue PPI   Obesity:  - Weight loss recommended, consider sleep study.   Bernardino KATHEE Come, MD [facility] [email or web address] 05/19/2024, 1:59 PM

## 2024-05-19 NOTE — TOC Initial Note (Signed)
 Transition of Care [facility]) - Initial/Assessment Note    Patient Details  Name: Miguel Marsh MRN: [id number] Date of Birth: 1961/02/28  Transition [facility]) CM/SW Contact:    Noreen KATHEE Cleotilde ISRAEL Phone Number: 05/19/2024, 2:08 PM  Clinical Narrative:                  CSW assessed patient this morning. Patient is from [facility] in [place]. Patient shared that he uses a cane at the care home and that they assist with some of his ADL's. After speaking with patient, CSW called [facility] and spoke with Randie who confirmed being able to accept patient back and will need FL2 and DC summary on DC. Randie also shared that they can provide transportation for patient. TOC to follow.   Expected Discharge Plan: Home/Self Care Barriers to Discharge: Continued Medical Work up   Patient Goals and CMS Choice Patient states their goals for this hospitalization and ongoing recovery are:: return back to home care CMS Medicare.gov Compare Post Acute Care list provided to:: Patient        Expected Discharge Plan and Services In-house Referral: Clinical Social Work   Post Acute Care Choice: Durable Medical Equipment Living arrangements for the past 2 months: Assisted Living Facility                                      Prior Living Arrangements/Services Living arrangements for the past 2 months: Assisted Living Facility Lives with:: Facility Resident Patient language and need for interpreter reviewed:: Yes Do you feel safe going back to the place where you live?: Yes      Need for Family Participation in Patient Care: No (Comment) Care giver support system in place?: No (comment) Current home services: DME Criminal Activity/Legal Involvement Pertinent to Current Situation/Hospitalization: No - Comment as needed  Activities of Daily Living   ADL Screening (condition at time of admission) Independently performs ADLs?: Yes (appropriate for developmental  age) Is the patient deaf or have difficulty hearing?: No Does the patient have difficulty seeing, even when wearing glasses/contacts?: No Does the patient have difficulty concentrating, remembering, or making decisions?: No  Permission Sought/Granted      Share Information with NAME: Myson     Permission granted to share info w Relationship: Patient     Emotional Assessment Appearance:: Appears stated age Attitude/Demeanor/Rapport: Engaged Affect (typically observed): Accepting, Appropriate Orientation: : Oriented to Situation, Oriented to  Time, Oriented to Place, Oriented to Self Alcohol  / Substance Use: Not Applicable Psych Involvement: No (comment)  Admission diagnosis:  COPD exacerbation (HCC) [J44.1] Patient Active Problem List   Diagnosis Date Noted   Acute respiratory failure (HCC) 04/03/2024   Acute on chronic respiratory failure (HCC) 03/04/2024   Anxiety 01/15/2024   Cocaine abuse (HCC) 09/13/2023   CAP (community acquired pneumonia) 06/24/2023   Lung nodule 06/24/2023   COVID-19 virus infection 06/24/2023   Elevated MCV 03/06/2023   Acute metabolic encephalopathy 02/01/2023   Leukocytosis 10/06/2020   Goals of care, counseling/discussion    Palliative care by specialist    DNR (do not resuscitate) discussion    Hyperglycemia 04/12/2020   Chronic respiratory failure with hypoxia and hypercapnia (HCC) 01/22/2020   Acute on chronic respiratory failure with hypoxia and hypercapnia (HCC) 01/23/2018   Obesity, Class III, BMI 40-49.9 (morbid obesity) 01/01/2018   Acute exacerbation of chronic obstructive pulmonary disease (  COPD) (HCC) 01/01/2018   Essential hypertension 12/09/2015   Non-small cell cancer of right lung (in remission) 12/09/2015   GERD (gastroesophageal reflux disease) 12/09/2015   Tobacco use disorder 12/17/2014   COPD exacerbation (HCC) 12/17/2014   Obesity 12/17/2014   Dyspnea    Difficulty walking 04/02/2013   Hip weakness 04/02/2013   PCP:   Renato Dorothey HERO, NP Pharmacy:   [facility], [place] - [address]  [place] [address] Phone: [phone] Fax: [phone]     Social Drivers of Health (SDOH) Social History: SDOH Screenings   Food Insecurity: No Food Insecurity (05/18/2024)  Housing: Low Risk  (05/18/2024)  Transportation Needs: No Transportation Needs (05/18/2024)  Utilities: Not At Risk (05/18/2024)  Tobacco Use: Medium Risk (05/18/2024)   SDOH Interventions:     Readmission Risk Interventions    04/02/2024    2:02 PM 03/05/2024    8:31 AM 01/28/2024   12:56 PM  Readmission Risk Prevention Plan  Transportation Screening Complete Complete Complete  Medication Review Oceanographer) Complete Complete Complete  HRI or Home Care Consult Complete Complete Complete  SW Recovery Care/Counseling Consult Complete Complete Complete  Palliative Care Screening Not Applicable Not Applicable Not Applicable  Skilled Nursing Facility Not Applicable Not Applicable Not Applicable

## 2024-05-20 DIAGNOSIS — J441 Chronic obstructive pulmonary disease with (acute) exacerbation: Secondary | ICD-10-CM | POA: Diagnosis not present

## 2024-05-20 MED ORDER — AZITHROMYCIN 250 MG PO TABS: 250.0000 mg | ORAL_TABLET | Freq: Every day | ORAL | 0 refills | Status: AC

## 2024-05-20 MED ORDER — PREDNISONE 20 MG PO TABS: 40.0000 mg | ORAL_TABLET | Freq: Every day | ORAL | 0 refills | Status: AC

## 2024-05-20 NOTE — Discharge Summary (Signed)
 Physician Discharge Summary   Patient: Miguel Marsh MRN: [id number] DOB: May 01, 1961  Admit date:     05/18/2024  Discharge date: 05/20/24  Discharge Physician: Bernardino KATHEE Come   PCP: Renato Dorothey HERO, NP   Recommendations at discharge:  Continue routine follow up for respiratory failure and NSCLC.  Discharge Diagnoses: Principal Problem:   COPD exacerbation (HCC) Active Problems:   Tobacco use disorder   Acute on chronic respiratory failure (HCC)   Essential hypertension   Non-small cell cancer of right lung (in remission)   Cocaine abuse [facility])   Anxiety  Hospital Course: Miguel Marsh is a 63 y.o. male with a history of 2L O2-dependent COPD with frequent readmissions for AECOPD (8 admissions in past 12 months), metastatic NSCLC, cocaine use, HTN who presented to the [facility] on [facility]18/2024 with shortness of breath, wheezing, and subsequently admitted for recurrent AECOPD. With steroids, azithromycin , and nebulized therapies, he has returned to respiratory baseline and is stable for discharge.   Assessment and Plan: Acute on chronic hypoxic and hypercarbic respiratory failure: due to AECOPD. Desaturated on home oxygen  with ambulation in ED. VBG w/normal pH and pCO2 of 98 (values since 2021 have all been near this, between 81 and >120). - Continue supplemental oxygen  to maintain normal WOB and SpO2 >88%.    AECOPD: RVP negative, PCT reassuring.  - Empiric azithromycin  x5 days (2 more days after discharge) - Continue controller Breztri  - Continue scheduled albuterol  - Continue steroids with prednisone  for 2 more days after discharge.  - IS/FV/mucolytic.    History of cocaine use: Last UDS in May 2025 was negative. Was positive March 2025, Dec 2024.  - Cessation counseling provided.  - Will just plan to avoid beta blockers given this and bronchospasm.    Tobacco use:  - Cessation counseling provided   HTN, chronic HFpEF: - Continue HCTZ 25mg   - Avoid beta blocker    Anxiety:  - Continue clonazepam  at 1mg  dose prescribed consistently (last on 7/18, regularly 60 tabs / 30-day supply per PDMP review)   Chronic pain:  - Continue oxycodone  (regular fill of oxyIR 15mg  #90 for 30-day supply Rx by Dorothey Renato, DNP last on 7/16 confirmed by PDMP review at admission).    Metastatic NSCLC: s/p pembrolizumab 2016 for left lung CA > remission, relapse in RUL and RLL (synchronous primary vs. metastatic) Aug 2024 RLL underwent SBRT Dec 2024 - Feb 2025. - Continue regular follow up with oncology in [facility] system, Dr. Missy.    GERD:  - Continue PPI   Obesity:  - Weight loss recommended, consider sleep study.   Consultants: None Procedures performed: None  Disposition: Home Diet recommendation: Cardiac DISCHARGE MEDICATION: Allergies as of 05/20/2024       Reactions   Ibuprofen  Shortness Of Breath        Medication List     STOP taking these medications    methylPREDNISolone  4 MG Tbpk tablet Commonly known as: MEDROL  DOSEPAK       TAKE these medications    acetaminophen  325 MG tablet Commonly known as: TYLENOL  Take 2 tablets (650 mg total) by mouth every 6 (six) hours as needed for mild pain (pain score 1-3) (or Fever >/= 101).   albuterol  108 (90 Base) MCG/ACT inhaler Commonly known as: VENTOLIN  HFA Inhale 2 puffs into the lungs every 6 (six) hours as needed for shortness of breath or wheezing (Cough).   albuterol  (2.5 MG/3ML) 0.083% nebulizer solution Commonly known as: PROVENTIL  Inhale 3 mLs (2.5  mg total) into the lungs every 6 (six) hours as needed for wheezing or shortness of breath.   azithromycin  250 MG tablet Commonly known as: ZITHROMAX  Take 1 tablet (250 mg total) by mouth daily for 2 days. Start taking on: May 21, 2024   Breztri  Aerosphere 160-9-4.8 MCG/ACT Aero inhaler Generic drug: budesonide -glycopyrrolate -formoterol  Inhale 2 puffs into the lungs 2 (two) times daily.   clonazePAM  1 MG tablet Commonly known as:  KLONOPIN  Take 1 tablet (1 mg total) by mouth 2 (two) times daily as needed for anxiety.   furosemide  40 MG tablet Commonly known as: LASIX  Take 0.5 tablets (20 mg total) by mouth See admin instructions. Take once a day  as needed for fluid/swelling What changed:  when to take this reasons to take this additional instructions   guaiFENesin  600 MG 12 hr tablet Commonly known as: Mucinex  Take 1 tablet (600 mg total) by mouth 2 (two) times daily.   hydrochlorothiazide  25 MG tablet Commonly known as: HYDRODIURIL  Take 25 mg by mouth every morning.   nicotine  21 mg/24hr patch Commonly known as: NICODERM CQ  - dosed in mg/24 hours Place 21 mg onto the skin daily.   omeprazole  40 MG capsule Commonly known as: PRILOSEC Take 1 capsule (40 mg total) by mouth in the morning and at bedtime.   oxyCODONE  15 MG immediate release tablet Commonly known as: ROXICODONE  Take 15 mg by mouth every 8 (eight) hours as needed for pain.   OXYGEN  Inhale 3 L into the lungs continuous.   predniSONE  20 MG tablet Commonly known as: DELTASONE  Take 2 tablets (40 mg total) by mouth daily with breakfast for 2 days. Start taking on: May 21, 2024   traZODone  50 MG tablet Commonly known as: DESYREL  Take 1 tablet (50 mg total) by mouth at bedtime as needed for sleep.        Follow-up Information     Renato Dorothey HERO, NP Follow up.   Specialty: Internal Medicine Contact information: [address] [place] [address] [phone]                Discharge Exam: Fredricka Weights   05/18/24 1226  Weight: 98 kg  BP (!) 152/81 (BP Location: Right Arm)   Pulse (!) 58   Temp 97.9 F (36.6 C) (Oral)   Resp 20   Ht 5' 6 (1.676 m)   Wt 98 kg   SpO2 95%   BMI 34.87 kg/m   No distress, ambulating in room with dyspnea but no distress.  Clear, nonlabored  Condition at discharge: stable  The results of significant diagnostics from this hospitalization (including imaging, microbiology,  ancillary and laboratory) are listed below for reference.   Imaging Studies: DG Chest Port 1 View Result Date: 05/18/2024 CLINICAL DATA:  63 year old male with shortness of breath and cough progressive over the past week. EXAM: PORTABLE CHEST 1 VIEW COMPARISON:  Portable chest 04/02/2024 and earlier. FINDINGS: Portable AP view at 0840 hours. Stable large lung volumes. Chronic but increased mild streaky mid and lower lung opacities greater on the left. Mediastinal contours are stable and within normal limits. Visualized tracheal air column is within normal limits. No pneumothorax, pulmonary edema, pleural effusion or consolidation. Paucity of bowel gas. No acute osseous abnormality identified. IMPRESSION: Chronic lung disease with mildly increased streaky mid and lower lung opacities, greater on the left and compatible with acute infectious exacerbation. No pleural effusion or consolidation. Electronically Signed   By: VEAR Shona HERO.D.  On: 05/18/2024 08:58    Microbiology: Results for orders placed or performed during the hospital encounter of 05/18/24  Respiratory (~20 pathogens) panel by PCR     Status: None   Collection Time: 05/18/24 12:33 PM   Specimen: Nasopharyngeal Swab; Respiratory  Result Value Ref Range Status   Adenovirus NOT DETECTED NOT DETECTED Final   Coronavirus 229E NOT DETECTED NOT DETECTED Final    Comment: (NOTE) The Coronavirus on the Respiratory Panel, DOES NOT test for the novel  Coronavirus (2019 nCoV)    Coronavirus HKU1 NOT DETECTED NOT DETECTED Final   Coronavirus NL63 NOT DETECTED NOT DETECTED Final   Coronavirus OC43 NOT DETECTED NOT DETECTED Final   Metapneumovirus NOT DETECTED NOT DETECTED Final   Rhinovirus / Enterovirus NOT DETECTED NOT DETECTED Final   Influenza A NOT DETECTED NOT DETECTED Final   Influenza B NOT DETECTED NOT DETECTED Final   Parainfluenza Virus 1 NOT DETECTED NOT DETECTED Final   Parainfluenza Virus 2 NOT DETECTED NOT DETECTED Final    Parainfluenza Virus 3 NOT DETECTED NOT DETECTED Final   Parainfluenza Virus 4 NOT DETECTED NOT DETECTED Final   Respiratory Syncytial Virus NOT DETECTED NOT DETECTED Final   Bordetella pertussis NOT DETECTED NOT DETECTED Final   Bordetella Parapertussis NOT DETECTED NOT DETECTED Final   Chlamydophila pneumoniae NOT DETECTED NOT DETECTED Final   Mycoplasma pneumoniae NOT DETECTED NOT DETECTED Final    Comment: Performed at [facility], [address], [place], [place] [address]    Labs: CBC: Recent Labs  Lab 05/18/24 0822  WBC 5.1  NEUTROABS 2.8  HGB 12.5*  HCT 42.5  MCV 99.3  PLT 197   Basic Metabolic Panel: Recent Labs  Lab 05/18/24 0822  NA 144  K 4.1  CL 95*  CO2 38*  GLUCOSE 101*  BUN 13  CREATININE 0.75  CALCIUM 9.2   Liver Function Tests: Recent Labs  Lab 05/18/24 0822  AST 19  ALT 15  ALKPHOS 83  BILITOT 0.6  PROT 6.9  ALBUMIN 3.4*   CBG: No results for input(s): GLUCAP in the last 168 hours.  Discharge time spent: greater than 30 minutes.  Signed: Bernardino KATHEE Come, MD [facility] 05/20/2024

## 2024-05-20 NOTE — NC FL2 (Signed)
 [place]  MEDICAID FL2 LEVEL OF CARE FORM     IDENTIFICATION  Patient Name: Miguel Marsh Birthdate: [birth year] Sex: male Admission Date (Current Location): 05/18/2024  [place] and [place] Number:  Emanuel Dowson ( brother) [phone] Facility and Address:  [facility],  [address] [place] [address]      Provider Number: [id number]  Attending Physician Name and Address:  Bryn Bernardino KATHEE, MD  Relative Name and Phone Number:       Current Level of Care: Hospital Recommended Level of Care: [facility] Prior Approval Number:    Date Approved/Denied:   PASRR Number:    Discharge Plan: Other (Comment) (Adult Care Home)    Current Diagnoses: Patient Active Problem List   Diagnosis Date Noted   Acute respiratory failure (HCC) 04/03/2024   Acute on chronic respiratory failure (HCC) 03/04/2024   Anxiety 01/15/2024   Cocaine abuse (HCC) 09/13/2023   CAP (community acquired pneumonia) 06/24/2023   Lung nodule 06/24/2023   COVID-19 virus infection 06/24/2023   Elevated MCV 03/06/2023   Acute metabolic encephalopathy 02/01/2023   Leukocytosis 10/06/2020   Goals of care, counseling/discussion    Palliative care by specialist    DNR (do not resuscitate) discussion    Hyperglycemia 04/12/2020   Chronic respiratory failure with hypoxia and hypercapnia (HCC) 01/22/2020   Acute on chronic respiratory failure with hypoxia and hypercapnia (HCC) 01/23/2018   Obesity, Class III, BMI 40-49.9 (morbid obesity) 01/01/2018   Acute exacerbation of chronic obstructive pulmonary disease (COPD) (HCC) 01/01/2018   Essential hypertension 12/09/2015   Non-small cell cancer of right lung (in remission) 12/09/2015   GERD (gastroesophageal reflux disease) 12/09/2015   Tobacco use disorder 12/17/2014   COPD exacerbation (HCC) 12/17/2014   Obesity 12/17/2014   Dyspnea    Difficulty walking 04/02/2013   Hip weakness 04/02/2013    Orientation RESPIRATION  BLADDER Height & Weight     Self, Time, Situation, Place  O2 (3L) Continent Weight: 216 lb 0.8 oz (98 kg) Height:  5' 6 (167.6 cm)  BEHAVIORAL SYMPTOMS/MOOD NEUROLOGICAL BOWEL NUTRITION STATUS      Continent Diet (See DC summary)  AMBULATORY STATUS COMMUNICATION OF NEEDS Skin   Independent Verbally Normal                       Personal Care Assistance Level of Assistance  Bathing, Dressing Bathing Assistance: Limited assistance   Dressing Assistance: Limited assistance     Functional Limitations Info  Sight, Hearing, Speech Sight Info: Impaired Hearing Info: Adequate Speech Info: Adequate    SPECIAL CARE FACTORS FREQUENCY                       Contractures Contractures Info: Not present    Additional Factors Info  Code Status, Allergies Code Status Info: DNR - Limited Allergies Info: Ibuprofen            Current Medications (05/20/2024):  This is the current hospital active medication list Current Facility-Administered Medications  Medication Dose Route Frequency Provider Last Rate Last Admin   acetaminophen  (TYLENOL ) tablet 650 mg  650 mg Oral Q6H PRN Bryn Bernardino KATHEE, MD   650 mg at 05/18/24 1709   Or   acetaminophen  (TYLENOL ) suppository 650 mg  650 mg Rectal Q6H PRN Bryn Bernardino KATHEE, MD       albuterol  (PROVENTIL ) (2.5 MG/3ML) 0.083% nebulizer solution 2.5 mg  2.5 mg Nebulization Q4H PRN Bryn Bernardino KATHEE, MD  albuterol  (PROVENTIL ) (2.5 MG/3ML) 0.083% nebulizer solution 2.5 mg  2.5 mg Nebulization TID Bryn Bernardino NOVAK, MD   2.5 mg at 05/20/24 9278   azithromycin  (ZITHROMAX ) tablet 250 mg  250 mg Oral Daily Bryn Bernardino NOVAK, MD   250 mg at 05/20/24 9185   budesonide -glycopyrrolate -formoterol  (BREZTRI ) 160-9-4.8 MCG/ACT inhaler 2 puff  2 puff Inhalation BID Bryn Bernardino NOVAK, MD   2 puff at 05/20/24 0724   clonazePAM  (KLONOPIN ) tablet 1 mg  1 mg Oral BID PRN Bryn Bernardino NOVAK, MD   1 mg at 05/19/24 2113   enoxaparin  (LOVENOX ) injection 40 mg  40 mg Subcutaneous Q24H Bryn Bernardino B, MD   40 mg at 05/19/24 2113   guaiFENesin  (MUCINEX ) 12 hr tablet 600 mg  600 mg Oral BID Bryn Bernardino NOVAK, MD   600 mg at 05/20/24 9184   hydrochlorothiazide  (HYDRODIURIL ) tablet 25 mg  25 mg Oral q morning Bryn Bernardino NOVAK, MD   25 mg at 05/20/24 9185   methylPREDNISolone  sodium succinate (SOLU-MEDROL ) 40 mg/mL injection 40 mg  40 mg Intravenous Q12H Bryn Bernardino B, MD   40 mg at 05/20/24 0815   ondansetron  (ZOFRAN ) tablet 4 mg  4 mg Oral Q6H PRN Bryn Bernardino NOVAK, MD       Or   ondansetron  (ZOFRAN ) injection 4 mg  4 mg Intravenous Q6H PRN Bryn Bernardino NOVAK, MD       pantoprazole  (PROTONIX ) EC tablet 40 mg  40 mg Oral Daily Bryn Bernardino NOVAK, MD   40 mg at 05/20/24 0815   traZODone  (DESYREL ) tablet 50 mg  50 mg Oral QHS PRN Bryn Bernardino NOVAK, MD         Discharge Medications: Please see discharge summary for a list of discharge medications.  Relevant Imaging Results:  Relevant Lab Results:   Additional Information SSN: [id number] [place], LCSWA

## 2024-05-20 NOTE — Progress Notes (Signed)
 Spoke to [place] at [facility] where patient will be returning FL2 received and she is sending ride to pick him up now .

## 2024-05-20 NOTE — TOC Transition Note (Signed)
 Transition of Care [facility]) - Discharge Note   Patient Details  Name: Miguel Marsh MRN: [id number] Date of Birth: 1961/01/10  Transition of Care [facility]) CM/SW Contact:  Noreen KATHEE Pinal, LCSWA Phone Number: 05/20/2024, 10:00 AM   Clinical Narrative:     Patient is returning back to [place] [facility] care home. CSW faxed FL2 and DC summary  to [facility] fax this morning. TOC signing off.   Final next level of care: Home/Self Care Barriers to Discharge: Barriers Resolved   Patient Goals and CMS Choice Patient states their goals for this hospitalization and ongoing recovery are:: return back to care home CMS Medicare.gov Compare Post Acute Care list provided to:: Patient Choice offered to / list presented to : Patient      Discharge Placement                Patient to be transferred to facility by: [facility] Name of family member notified: Normand Patient and family notified of of transfer: 05/20/24  Discharge Plan and Services Additional resources added to the After Visit Summary for   In-house Referral: Clinical Social Work   Post Acute Care Choice: Durable Medical Equipment                               Social Drivers of Health (SDOH) Interventions SDOH Screenings   Food Insecurity: No Food Insecurity (05/18/2024)  Housing: Low Risk  (05/18/2024)  Transportation Needs: No Transportation Needs (05/18/2024)  Utilities: Not At Risk (05/18/2024)  Tobacco Use: Medium Risk (05/18/2024)     Readmission Risk Interventions    05/20/2024    9:57 AM 04/02/2024    2:02 PM 03/05/2024    8:31 AM  Readmission Risk Prevention Plan  Transportation Screening Complete Complete Complete  Medication Review Oceanographer) Complete Complete Complete  HRI or Home Care Consult Complete Complete Complete  SW Recovery Care/Counseling Consult Complete Complete Complete  Palliative Care Screening Not Applicable Not Applicable Not Applicable  Skilled Nursing Facility Not Applicable Not  Applicable Not Applicable

## 2024-06-12 ENCOUNTER — Emergency Department (HOSPITAL_COMMUNITY)

## 2024-06-12 ENCOUNTER — Emergency Department (HOSPITAL_COMMUNITY)
Admission: EM | Admit: 2024-06-12 | Discharge: 2024-06-12 | Disposition: A | Discharge status: Home / Self Care | Attending: Emergency Medicine | Admitting: Emergency Medicine

## 2024-06-12 ENCOUNTER — Other Ambulatory Visit: Payer: Self-pay

## 2024-06-12 ENCOUNTER — Encounter (HOSPITAL_COMMUNITY): Payer: Self-pay | Admitting: Emergency Medicine

## 2024-06-12 DIAGNOSIS — J441 Chronic obstructive pulmonary disease with (acute) exacerbation: Secondary | ICD-10-CM | POA: Diagnosis not present

## 2024-06-12 DIAGNOSIS — R0602 Shortness of breath: Secondary | ICD-10-CM | POA: Diagnosis present

## 2024-06-12 LAB — CBC WITH DIFFERENTIAL/PLATELET
Abs Immature Granulocytes: 0.01 K/uL (ref 0.00–0.07)
Basophils Absolute: 0 K/uL (ref 0.0–0.1)
Basophils Relative: 1 %
Eosinophils Absolute: 0.2 K/uL (ref 0.0–0.5)
Eosinophils Relative: 3 %
HCT: 42.2 % (ref 39.0–52.0)
Hemoglobin: 12.8 g/dL — ABNORMAL LOW (ref 13.0–17.0)
Immature Granulocytes: 0 %
Lymphocytes Relative: 23 %
Lymphs Abs: 1.3 K/uL (ref 0.7–4.0)
MCH: 29 pg (ref 26.0–34.0)
MCHC: 30.3 g/dL (ref 30.0–36.0)
MCV: 95.5 fL (ref 80.0–100.0)
Monocytes Absolute: 0.6 K/uL (ref 0.1–1.0)
Monocytes Relative: 11 %
Neutro Abs: 3.6 K/uL (ref 1.7–7.7)
Neutrophils Relative %: 62 %
Platelets: 190 K/uL (ref 150–400)
RBC: 4.42 MIL/uL (ref 4.22–5.81)
RDW: 12.1 % (ref 11.5–15.5)
WBC: 5.8 K/uL (ref 4.0–10.5)
nRBC: 0 % (ref 0.0–0.2)

## 2024-06-12 LAB — COMPREHENSIVE METABOLIC PANEL WITH GFR
ALT: 10 U/L (ref 0–44)
AST: 15 U/L (ref 15–41)
Albumin: 3.5 g/dL (ref 3.5–5.0)
Alkaline Phosphatase: 59 U/L (ref 38–126)
Anion gap: 11 (ref 5–15)
BUN: 9 mg/dL (ref 8–23)
CO2: 36 mmol/L — ABNORMAL HIGH (ref 22–32)
Calcium: 9 mg/dL (ref 8.9–10.3)
Chloride: 92 mmol/L — ABNORMAL LOW (ref 98–111)
Creatinine, Ser: 0.71 mg/dL (ref 0.61–1.24)
GFR, Estimated: >60 mL/min (ref >60)
Glucose, Bld: 106 mg/dL — ABNORMAL HIGH (ref 70–99)
Potassium: 3.9 mmol/L (ref 3.5–5.1)
Sodium: 139 mmol/L (ref 135–145)
Total Bilirubin: 1 mg/dL (ref 0.0–1.2)
Total Protein: 6.5 g/dL (ref 6.5–8.1)

## 2024-06-12 LAB — RESP PANEL BY RT-PCR (RSV, FLU A&B, COVID)  RVPGX2
Influenza A by PCR: NEGATIVE
Influenza B by PCR: NEGATIVE
Resp Syncytial Virus by PCR: NEGATIVE
SARS Coronavirus 2 by RT PCR: NEGATIVE

## 2024-06-12 MED ORDER — DOXYCYCLINE HYCLATE 100 MG PO CAPS: 100.0000 mg | ORAL_CAPSULE | Freq: Two times a day (BID) | ORAL | 0 refills | Status: DC

## 2024-06-12 MED ORDER — MAGNESIUM SULFATE 2 GM/50ML IV SOLN
2.0000 g | Freq: Once | INTRAVENOUS | Status: AC
  Administered 2024-06-12: 2 g via INTRAVENOUS
  Filled 2024-06-12: qty 50

## 2024-06-12 MED ORDER — PREDNISONE 10 MG PO TABS: 20.0000 mg | ORAL_TABLET | Freq: Every day | ORAL | 0 refills | Status: DC

## 2024-06-12 MED ORDER — METHYLPREDNISOLONE SODIUM SUCC 125 MG IJ SOLR
125.0000 mg | Freq: Once | INTRAMUSCULAR | Status: AC
  Administered 2024-06-12: 125 mg via INTRAVENOUS
  Filled 2024-06-12: qty 2

## 2024-06-12 NOTE — Discharge Instructions (Signed)
 Use your albuterol nebulizer every 4-6 hours as needed.  Follow-up with your family doctor next week.  Return if any problems

## 2024-06-12 NOTE — ED Provider Notes (Signed)
 [place] EMERGENCY DEPARTMENT AT [facility] Provider Note   CSN: [id number] Arrival date & time: 06/12/24  0800     Patient presents with: Shortness of Breath   Miguel Marsh is a 63 y.o. male.   Patient complains of shortness of breath.  Patient has a history of COPD  The history is provided by the patient and medical records. No language interpreter was used.  Shortness of Breath Severity:  Moderate Onset quality:  Sudden Timing:  Constant Progression:  Worsening Chronicity:  Recurrent Context: activity   Relieved by:  Nothing Worsened by:  Nothing Ineffective treatments:  Inhaler Associated symptoms: wheezing   Associated symptoms: no abdominal pain, no chest pain, no cough, no headaches and no rash        Prior to Admission medications   Medication Sig Start Date End Date Taking? Authorizing Provider  doxycycline  (VIBRAMYCIN ) 100 MG capsule Take 1 capsule (100 mg total) by mouth 2 (two) times daily. One po bid x 7 days 06/12/24  Yes Sameera Betton, MD  predniSONE  (DELTASONE ) 10 MG tablet Take 2 tablets (20 mg total) by mouth daily. 06/12/24  Yes Elliannah Wayment, MD  acetaminophen  (TYLENOL ) 325 MG tablet Take 2 tablets (650 mg total) by mouth every 6 (six) hours as needed for mild pain (pain score 1-3) (or Fever >/= 101). 01/31/24   Pearlean Manus, MD  albuterol  (PROVENTIL ) (2.5 MG/3ML) 0.083% nebulizer solution Inhale 3 mLs (2.5 mg total) into the lungs every 6 (six) hours as needed for wheezing or shortness of breath. 01/31/24   Pearlean Manus, MD  albuterol  (VENTOLIN  HFA) 108 (90 Base) MCG/ACT inhaler Inhale 2 puffs into the lungs every 6 (six) hours as needed for shortness of breath or wheezing (Cough). 01/31/24   Pearlean Manus, MD  BREZTRI  AEROSPHERE 160-9-4.8 MCG/ACT AERO Inhale 2 puffs into the lungs 2 (two) times daily. 01/31/24   Pearlean Manus, MD  clonazePAM  (KLONOPIN ) 1 MG tablet Take 1 tablet (1 mg total) by mouth 2 (two) times daily as needed  for anxiety. 11/01/23   Pearlean Manus, MD  furosemide  (LASIX ) 40 MG tablet Take 0.5 tablets (20 mg total) by mouth See admin instructions. Take once a day  as needed for fluid/swelling Patient taking differently: Take 20 mg by mouth daily as needed for fluid or edema. 11/01/23   Pearlean Manus, MD  guaiFENesin  (MUCINEX ) 600 MG 12 hr tablet Take 1 tablet (600 mg total) by mouth 2 (two) times daily. 01/31/24 01/30/25  Pearlean Manus, MD  hydrochlorothiazide  (HYDRODIURIL ) 25 MG tablet Take 25 mg by mouth every morning.    [provider]  nicotine  (NICODERM CQ  - DOSED IN MG/24 HOURS) 21 mg/24hr patch Place 21 mg onto the skin daily. 02/06/24   [provider]  omeprazole  (PRILOSEC) 40 MG capsule Take 1 capsule (40 mg total) by mouth in the morning and at bedtime. 03/06/24   Ricky Fines, MD  oxyCODONE  (ROXICODONE ) 15 MG immediate release tablet Take 15 mg by mouth every 8 (eight) hours as needed for pain. 05/14/24   [provider]  OXYGEN  Inhale 3 L into the lungs continuous.     [provider]  traZODone  (DESYREL ) 50 MG tablet Take 1 tablet (50 mg total) by mouth at bedtime as needed for sleep. 01/31/24   Pearlean Manus, MD    Allergies: Ibuprofen     Review of Systems  Constitutional:  Negative for appetite change and fatigue.  HENT:  Negative for congestion, ear discharge and sinus pressure.  Eyes:  Negative for discharge.  Respiratory:  Positive for shortness of breath and wheezing. Negative for cough.   Cardiovascular:  Negative for chest pain.  Gastrointestinal:  Negative for abdominal pain and diarrhea.  Genitourinary:  Negative for frequency and hematuria.  Musculoskeletal:  Negative for back pain.  Skin:  Negative for rash.  Neurological:  Negative for seizures and headaches.  Psychiatric/Behavioral:  Negative for hallucinations.     Updated Vital Signs BP 117/89   Pulse 84   Temp 98.9 F (37.2 C) (Oral)   Resp (!) 24   Ht 5' 6 (1.676 m)    Wt 98 kg   SpO2 93%   BMI 34.87 kg/m   Physical Exam Vitals and nursing note reviewed.  Constitutional:      Appearance: He is well-developed.  HENT:     Head: Normocephalic.     Nose: Nose normal.  Eyes:     General: No scleral icterus.    Conjunctiva/sclera: Conjunctivae normal.  Neck:     Thyroid: No thyromegaly.  Cardiovascular:     Rate and Rhythm: Normal rate and regular rhythm.     Heart sounds: No murmur heard.    No friction rub. No gallop.  Pulmonary:     Breath sounds: No stridor. Wheezing present. No rales.  Chest:     Chest wall: No tenderness.  Abdominal:     General: There is no distension.     Tenderness: There is no abdominal tenderness. There is no rebound.  Musculoskeletal:        General: Normal range of motion.     Cervical back: Neck supple.  Lymphadenopathy:     Cervical: No cervical adenopathy.  Skin:    Findings: No erythema or rash.  Neurological:     Mental Status: He is alert and oriented to person, place, and time.     Motor: No abnormal muscle tone.     Coordination: Coordination normal.  Psychiatric:        Behavior: Behavior normal.     (all labs ordered are listed, but only abnormal results are displayed) Labs Reviewed  CBC WITH DIFFERENTIAL/PLATELET - Abnormal; Notable for the following components:      Result Value   Hemoglobin 12.8 (*)    All other components within normal limits  COMPREHENSIVE METABOLIC PANEL WITH GFR - Abnormal; Notable for the following components:   Chloride 92 (*)    CO2 36 (*)    Glucose, Bld 106 (*)    All other components within normal limits  RESP PANEL BY RT-PCR (RSV, FLU A&B, COVID)  [id number]    EKG: None  Radiology: [facility] Chest Port 1 View Result Date: 06/12/2024 CLINICAL DATA:  Short of breath EXAM: PORTABLE CHEST 1 VIEW COMPARISON:  None Available. FINDINGS: 05/18/2024 normal cardiac silhouette. Linear atelectasis in the LEFT mid lung. No effusion, infiltrate, or pneumothorax. No acute osseous  abnormality. IMPRESSION: Linear atelectasis in the LEFT mid lung.  No acute findings Electronically Signed   By: Jackquline Boxer M.D.   On: 06/12/2024 09:40     Procedures   Medications Ordered in the ED  methylPREDNISolone  sodium succinate (SOLU-MEDROL ) 125 mg/2 mL injection 125 mg (125 mg Intravenous Given 06/12/24 0825)  magnesium  sulfate IVPB 2 g 50 mL (0 g Intravenous Stopped 06/12/24 0932)  Medical Decision Making Amount and/or Complexity of Data Reviewed Labs: ordered. Radiology: ordered. ECG/medicine tests: ordered.  Risk Prescription drug management.   Patient with COPD exacerbation.  He improved with albuterol  neb treatments, Solu-Medrol  and magnesium .  Patient sent home on prednisone  and doxycycline  and will use his nebs at home     Final diagnoses:  COPD exacerbation [facility])    ED Discharge Orders          Ordered    predniSONE  (DELTASONE ) 10 MG tablet  Daily        06/12/24 1141    doxycycline  (VIBRAMYCIN ) 100 MG capsule  2 times daily        06/12/24 1141               Suzette Pac, MD 06/13/24 1507

## 2024-06-12 NOTE — ED Triage Notes (Signed)
 Pt bib rcems. Pt c/o of feeling more SOB than normal x last week. Pt wheres 3L o2 at baseline. Sats in low 80s w/ EMS. Pt took home albuterol  inhaler and 5mg  albuterol  neb before ems arrived. Ems gave pt  2.5 albuterol  and 0.5 atrovent .  Pt sats 92 on 3L durning triage.

## 2024-07-11 ENCOUNTER — Emergency Department (HOSPITAL_COMMUNITY)

## 2024-07-11 ENCOUNTER — Other Ambulatory Visit: Payer: Self-pay

## 2024-07-11 ENCOUNTER — Inpatient Hospital Stay (HOSPITAL_COMMUNITY)
Admission: EM | Admit: 2024-07-11 | Discharge: 2024-07-15 | DRG: 189 | Disposition: A | Discharge status: Home / Self Care | Source: Skilled Nursing Facility | Attending: Internal Medicine | Admitting: Internal Medicine

## 2024-07-11 ENCOUNTER — Encounter (HOSPITAL_COMMUNITY): Payer: Self-pay | Admitting: Emergency Medicine

## 2024-07-11 DIAGNOSIS — J479 Bronchiectasis, uncomplicated: Secondary | ICD-10-CM | POA: Diagnosis present

## 2024-07-11 DIAGNOSIS — Z6836 Body mass index (BMI) 36.0-36.9, adult: Secondary | ICD-10-CM

## 2024-07-11 DIAGNOSIS — F39 Unspecified mood [affective] disorder: Secondary | ICD-10-CM | POA: Diagnosis present

## 2024-07-11 DIAGNOSIS — J441 Chronic obstructive pulmonary disease with (acute) exacerbation: Secondary | ICD-10-CM | POA: Diagnosis present

## 2024-07-11 DIAGNOSIS — K219 Gastro-esophageal reflux disease without esophagitis: Secondary | ICD-10-CM | POA: Diagnosis present

## 2024-07-11 DIAGNOSIS — G5780 Other specified mononeuropathies of unspecified lower limb: Secondary | ICD-10-CM | POA: Diagnosis present

## 2024-07-11 DIAGNOSIS — G894 Chronic pain syndrome: Secondary | ICD-10-CM | POA: Diagnosis present

## 2024-07-11 DIAGNOSIS — R531 Weakness: Secondary | ICD-10-CM | POA: Diagnosis present

## 2024-07-11 DIAGNOSIS — F17210 Nicotine dependence, cigarettes, uncomplicated: Secondary | ICD-10-CM | POA: Diagnosis present

## 2024-07-11 DIAGNOSIS — C3490 Malignant neoplasm of unspecified part of unspecified bronchus or lung: Secondary | ICD-10-CM | POA: Diagnosis present

## 2024-07-11 DIAGNOSIS — Z923 Personal history of irradiation: Secondary | ICD-10-CM | POA: Diagnosis not present

## 2024-07-11 DIAGNOSIS — J9621 Acute and chronic respiratory failure with hypoxia: Secondary | ICD-10-CM | POA: Diagnosis present

## 2024-07-11 DIAGNOSIS — Z96649 Presence of unspecified artificial hip joint: Secondary | ICD-10-CM | POA: Diagnosis present

## 2024-07-11 DIAGNOSIS — I110 Hypertensive heart disease with heart failure: Secondary | ICD-10-CM | POA: Diagnosis present

## 2024-07-11 DIAGNOSIS — Z716 Tobacco abuse counseling: Secondary | ICD-10-CM | POA: Diagnosis not present

## 2024-07-11 DIAGNOSIS — E66812 Obesity, class 2: Secondary | ICD-10-CM | POA: Diagnosis present

## 2024-07-11 DIAGNOSIS — I5032 Chronic diastolic (congestive) heart failure: Secondary | ICD-10-CM | POA: Diagnosis present

## 2024-07-11 LAB — COMPREHENSIVE METABOLIC PANEL WITH GFR
ALT: 13 U/L (ref 0–44)
AST: 22 U/L (ref 15–41)
Albumin: 3.7 g/dL (ref 3.5–5.0)
Alkaline Phosphatase: 68 U/L (ref 38–126)
Anion gap: 12 (ref 5–15)
BUN: 11 mg/dL (ref 8–23)
CO2: 36 mmol/L — ABNORMAL HIGH (ref 22–32)
Calcium: 9.3 mg/dL (ref 8.9–10.3)
Chloride: 92 mmol/L — ABNORMAL LOW (ref 98–111)
Creatinine, Ser: 0.82 mg/dL (ref 0.61–1.24)
GFR, Estimated: >60 mL/min (ref >60)
Glucose, Bld: 109 mg/dL — ABNORMAL HIGH (ref 70–99)
Potassium: 4 mmol/L (ref 3.5–5.1)
Sodium: 140 mmol/L (ref 135–145)
Total Bilirubin: 0.6 mg/dL (ref 0.0–1.2)
Total Protein: 7.2 g/dL (ref 6.5–8.1)

## 2024-07-11 LAB — CBC WITH DIFFERENTIAL/PLATELET
Abs Immature Granulocytes: 0.01 K/uL (ref 0.00–0.07)
Basophils Absolute: 0 K/uL (ref 0.0–0.1)
Basophils Relative: 0 %
Eosinophils Absolute: 0.2 K/uL (ref 0.0–0.5)
Eosinophils Relative: 4 %
HCT: 40.5 % (ref 39.0–52.0)
Hemoglobin: 12.7 g/dL — ABNORMAL LOW (ref 13.0–17.0)
Immature Granulocytes: 0 %
Lymphocytes Relative: 24 %
Lymphs Abs: 1.5 K/uL (ref 0.7–4.0)
MCH: 29.5 pg (ref 26.0–34.0)
MCHC: 31.4 g/dL (ref 30.0–36.0)
MCV: 94.2 fL (ref 80.0–100.0)
Monocytes Absolute: 0.9 K/uL (ref 0.1–1.0)
Monocytes Relative: 14 %
Neutro Abs: 3.7 K/uL (ref 1.7–7.7)
Neutrophils Relative %: 58 %
Platelets: 187 K/uL (ref 150–400)
RBC: 4.3 MIL/uL (ref 4.22–5.81)
RDW: 12 % (ref 11.5–15.5)
WBC: 6.4 K/uL (ref 4.0–10.5)
nRBC: 0 % (ref 0.0–0.2)

## 2024-07-11 LAB — TROPONIN I (HIGH SENSITIVITY)
Troponin I (High Sensitivity): 6 ng/L (ref <18)
Troponin I (High Sensitivity): 5 ng/L (ref <18)

## 2024-07-11 LAB — BLOOD GAS, VENOUS
Acid-Base Excess: 16.3 mmol/L — ABNORMAL HIGH (ref 0.0–2.0)
Bicarbonate: 45.7 mmol/L — ABNORMAL HIGH (ref 20.0–28.0)
Drawn by: 1517
O2 Saturation: 86.6 %
Patient temperature: 36.9
pCO2, Ven: 79 mmHg (ref 44–60)
pH, Ven: 7.37 (ref 7.25–7.43)
pO2, Ven: 53 mmHg — ABNORMAL HIGH (ref 32–45)

## 2024-07-11 LAB — I-STAT CHEM 8, ED
BUN: 11 mg/dL (ref 8–23)
Calcium, Ion: 1.05 mmol/L — ABNORMAL LOW (ref 1.15–1.40)
Chloride: 95 mmol/L — ABNORMAL LOW (ref 98–111)
Creatinine, Ser: 0.8 mg/dL (ref 0.61–1.24)
Glucose, Bld: 102 mg/dL — ABNORMAL HIGH (ref 70–99)
HCT: 40 % (ref 39.0–52.0)
Hemoglobin: 13.6 g/dL (ref 13.0–17.0)
Potassium: 3.9 mmol/L (ref 3.5–5.1)
Sodium: 139 mmol/L (ref 135–145)
TCO2: 38 mmol/L — ABNORMAL HIGH (ref 22–32)

## 2024-07-11 LAB — RESP PANEL BY RT-PCR (RSV, FLU A&B, COVID)  RVPGX2
Influenza A by PCR: NEGATIVE
Influenza B by PCR: NEGATIVE
Resp Syncytial Virus by PCR: NEGATIVE
SARS Coronavirus 2 by RT PCR: NEGATIVE

## 2024-07-11 LAB — RAPID URINE DRUG SCREEN, HOSP PERFORMED
Amphetamines: NOT DETECTED
Barbiturates: NOT DETECTED
Benzodiazepines: NOT DETECTED
Cocaine: NOT DETECTED
Opiates: NOT DETECTED
Tetrahydrocannabinol: NOT DETECTED

## 2024-07-11 LAB — BRAIN NATRIURETIC PEPTIDE: B Natriuretic Peptide: 10 pg/mL (ref 0.0–100.0)

## 2024-07-11 MED ORDER — IPRATROPIUM-ALBUTEROL 0.5-2.5 (3) MG/3ML IN SOLN
3.0000 mL | Freq: Once | RESPIRATORY_TRACT | Status: AC
  Administered 2024-07-11: 3 mL via RESPIRATORY_TRACT
  Filled 2024-07-11: qty 3

## 2024-07-11 MED ORDER — SODIUM CHLORIDE 0.9% FLUSH
3.0000 mL | Freq: Two times a day (BID) | INTRAVENOUS | Status: DC
Start: 2024-07-11 — End: 2024-07-15
  Administered 2024-07-11 – 2024-07-15 (×8): 3 mL via INTRAVENOUS

## 2024-07-11 MED ORDER — NICOTINE POLACRILEX 2 MG MT GUM: 2.0000 mg | CHEWING_GUM | OROMUCOSAL | Status: DC | PRN

## 2024-07-11 MED ORDER — LEVOFLOXACIN 500 MG PO TABS
500.0000 mg | ORAL_TABLET | ORAL | Status: DC
  Administered 2024-07-12 – 2024-07-15 (×4): 500 mg via ORAL
  Filled 2024-07-11 (×4): qty 1

## 2024-07-11 MED ORDER — PREDNISONE 20 MG PO TABS
40.0000 mg | ORAL_TABLET | Freq: Every day | ORAL | Status: DC
Start: 2024-07-12 — End: 2024-07-16
  Administered 2024-07-12 – 2024-07-13 (×2): 40 mg via ORAL
  Filled 2024-07-11 (×2): qty 2

## 2024-07-11 MED ORDER — IPRATROPIUM-ALBUTEROL 0.5-2.5 (3) MG/3ML IN SOLN
3.0000 mL | Freq: Four times a day (QID) | RESPIRATORY_TRACT | Status: DC
  Administered 2024-07-12 (×4): 3 mL via RESPIRATORY_TRACT
  Filled 2024-07-11 (×4): qty 3

## 2024-07-11 MED ORDER — BUDESON-GLYCOPYRROL-FORMOTEROL 160-9-4.8 MCG/ACT IN AERO
2.0000 | INHALATION_SPRAY | Freq: Two times a day (BID) | RESPIRATORY_TRACT | Status: DC
  Administered 2024-07-12 – 2024-07-13 (×3): 2 via RESPIRATORY_TRACT
  Filled 2024-07-11 (×2): qty 5.9

## 2024-07-11 MED ORDER — MAGNESIUM SULFATE 2 GM/50ML IV SOLN
2.0000 g | Freq: Once | INTRAVENOUS | Status: AC
  Administered 2024-07-11: 2 g via INTRAVENOUS
  Filled 2024-07-11: qty 50

## 2024-07-11 MED ORDER — HYDROCHLOROTHIAZIDE 25 MG PO TABS
25.0000 mg | ORAL_TABLET | Freq: Every morning | ORAL | Status: DC
  Administered 2024-07-12 – 2024-07-15 (×4): 25 mg via ORAL
  Filled 2024-07-11 (×4): qty 1

## 2024-07-11 MED ORDER — CLONAZEPAM 0.5 MG PO TABS
1.0000 mg | ORAL_TABLET | Freq: Two times a day (BID) | ORAL | Status: DC | PRN
  Administered 2024-07-13 – 2024-07-14 (×2): 1 mg via ORAL
  Filled 2024-07-11 (×2): qty 2

## 2024-07-11 MED ORDER — ALBUTEROL SULFATE (2.5 MG/3ML) 0.083% IN NEBU
2.5000 mg | INHALATION_SOLUTION | Freq: Once | RESPIRATORY_TRACT | Status: AC
  Administered 2024-07-11: 2.5 mg via RESPIRATORY_TRACT
  Filled 2024-07-11: qty 3

## 2024-07-11 MED ORDER — METHYLPREDNISOLONE SODIUM SUCC 125 MG IJ SOLR
125.0000 mg | Freq: Once | INTRAMUSCULAR | Status: AC
  Administered 2024-07-11: 125 mg via INTRAVENOUS
  Filled 2024-07-11: qty 2

## 2024-07-11 MED ORDER — PANTOPRAZOLE SODIUM 40 MG PO TBEC
40.0000 mg | DELAYED_RELEASE_TABLET | Freq: Two times a day (BID) | ORAL | Status: DC
  Administered 2024-07-12 – 2024-07-15 (×8): 40 mg via ORAL
  Filled 2024-07-11 (×8): qty 1

## 2024-07-11 MED ORDER — ALBUTEROL SULFATE (2.5 MG/3ML) 0.083% IN NEBU
2.5000 mg | INHALATION_SOLUTION | RESPIRATORY_TRACT | Status: DC | PRN
  Administered 2024-07-13: 2.5 mg via RESPIRATORY_TRACT
  Filled 2024-07-11: qty 3

## 2024-07-11 MED ORDER — ENOXAPARIN SODIUM 40 MG/0.4ML IJ SOSY
40.0000 mg | PREFILLED_SYRINGE | INTRAMUSCULAR | Status: DC
  Administered 2024-07-12 – 2024-07-15 (×4): 40 mg via SUBCUTANEOUS
  Filled 2024-07-11 (×4): qty 0.4

## 2024-07-11 NOTE — Progress Notes (Signed)
 MD wants to try patient off Bipap and see how he does, RN called to inform and I told her to put Bipap in standby mode and take him off.  If any issues to call.

## 2024-07-11 NOTE — H&P (Addendum)
 History and Physical    Miguel Marsh FMW:[id number] DOB: Aug 25, 1961 DOA: 07/11/2024  PCP: Renato Dorothey HERO, NP   Patient coming from: ALF [facility])    Chief Complaint:  Chief Complaint  Patient presents with   Respiratory Distress    HPI:  Miguel Marsh is a 63 y.o. 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   Reports SOB for few days, but suddenly worsened today and was increasingly SOB at rest. Typically he can go about 30 ft+ without stopping, had dropped to 44ft and then symptomatic at rest today. He has thick secretions that he has difficulty clearing. Otherwise notes intermittent sensation of chest tightness which is nonexertional. No other URI symptoms. No Fever or chills. No sick contacts. No allergy exposures. He has inhalers at home which he is using. He is still smoking about 2 cigarettes per day. No other drug use.   Otherwise notes has had about 1 month of bilateral thigh paresthesias/dysesthesia, burning. Does not radiate down his leg, feels more weak in his LE over past 2 weeks. No incontinence or saddle anesthesia    Review of Systems:  ROS complete and negative except as marked above   Allergies  Allergen Reactions   Ibuprofen  Shortness Of Breath    Prior to Admission medications   Medication Sig Start Date End Date Taking? Authorizing Provider  acetaminophen  (TYLENOL ) 325 MG tablet Take 2 tablets (650 mg total) by mouth every 6 (six) hours as needed for mild pain (pain score 1-3) (or Fever >/= 101). 01/31/24  Yes Emokpae, Courage, MD  albuterol  (PROVENTIL ) (2.5 MG/3ML) 0.083% nebulizer solution Inhale 3 mLs (2.5 mg total) into the  lungs every 6 (six) hours as needed for wheezing or shortness of breath. 01/31/24  Yes Pearlean Manus, MD  albuterol  (VENTOLIN  HFA) 108 (90 Base) MCG/ACT inhaler Inhale 2 puffs into the lungs every 6 (six) hours as needed for shortness of breath or wheezing (Cough). 01/31/24  Yes Emokpae, Courage, MD  BREZTRI  AEROSPHERE 160-9-4.8 MCG/ACT AERO Inhale 2 puffs into the lungs 2 (two) times daily. 01/31/24  Yes Pearlean Manus, MD  clonazePAM  (KLONOPIN ) 1 MG tablet Take 1 tablet (1 mg total) by mouth 2 (two) times daily as needed for anxiety. 11/01/23  Yes Emokpae, Courage, MD  hydrochlorothiazide  (HYDRODIURIL ) 25 MG tablet Take 25 mg by mouth every morning.   Yes [provider]  omeprazole  (PRILOSEC) 40 MG capsule Take 1 capsule (40 mg total) by mouth in the morning and at bedtime. 03/06/24  Yes Ricky Fines, MD  oxyCODONE  (ROXICODONE ) 15 MG immediate release tablet Take 15 mg by mouth every 8 (eight) hours as needed for pain. 05/14/24  Yes [provider]  OXYGEN  Inhale 4 L into the lungs continuous.    [provider]    Past Medical History:  Diagnosis Date   Arthritis    Bronchitis    Cancer (HCC)    metastatic NSCLC (followed by [facility])   COPD (chronic obstructive pulmonary disease) (HCC)    HTN (hypertension)     Past Surgical History:  Procedure Laterality Date   LUNG BIOPSY     TOTAL HIP ARTHROPLASTY     TUMOR REMOVAL Right  12/04/2017     reports that he has been smoking cigarettes. He has never used smokeless tobacco. He reports that he does not currently use alcohol . He reports current drug use. Drugs: Marijuana and Cocaine.  History reviewed. No pertinent family history.   Physical Exam: Vitals:   07/11/24 1924 07/11/24 1927 07/11/24 1927 07/11/24 2008  BP:      Pulse: 80 76  76  Resp: 16 17  17   Temp:      TempSrc:      SpO2: 93% 91% 91% 94%  Weight:      Height:        Gen: Awake, alert, chronically ill appearing.   CV: Regular, normal S1, S2,  no murmurs  Resp: Improved resp distress. Very diminished air movement, without wheezing or other adventitious sounds likely from decreased air movement.  Abd: Round, normoactive, nontender MSK: Symmetric, no edema  Skin: No rashes or lesions to exposed skin  Neuro: Alert and interactive  Psych: euthymic, appropriate    Data review:   Labs reviewed, notable for:   Bicarb 36 (hx hypercarbia)  BNP wnl, trop neg x2 WBC 6   Micro:  Results for orders placed or performed during the hospital encounter of 07/11/24  Resp panel by RT-PCR (RSV, Flu A&B, Covid) Anterior Nasal Swab     Status: None   Collection Time: 07/11/24  5:54 PM   Specimen: Anterior Nasal Swab  Result Value Ref Range Status   SARS Coronavirus 2 by RT PCR NEGATIVE NEGATIVE Final    Comment: (NOTE) SARS-CoV-2 target nucleic acids are NOT DETECTED.  The SARS-CoV-2 RNA is generally detectable in upper respiratory specimens during the acute phase of infection. The lowest concentration of SARS-CoV-2 viral copies this assay can detect is 138 copies/mL. A negative result does not preclude SARS-Cov-2 infection and should not be used as the sole basis for treatment or other patient management decisions. A negative result may occur with  improper specimen collection/handling, submission of specimen other than nasopharyngeal swab, presence of viral mutation(s) within the areas targeted by this assay, and inadequate number of viral copies(<138 copies/mL). A negative result must be combined with clinical observations, patient history, and epidemiological information. The expected result is Negative.  Fact Sheet for Patients:  BloggerCourse.com  Fact Sheet for Healthcare Providers:  SeriousBroker.it  This test is no t yet approved or cleared by the [place]  FDA and  has been authorized for detection and/or diagnosis of SARS-CoV-2 by FDA under an Emergency Use  Authorization (EUA). This EUA will remain  in effect (meaning this test can be used) for the duration of the COVID-19 declaration under Section 564(b)(1) of the Act, 21 U.S.C.section 360bbb-3(b)(1), unless the authorization is terminated  or revoked sooner.       Influenza A by PCR NEGATIVE NEGATIVE Final   Influenza B by PCR NEGATIVE NEGATIVE Final    Comment: (NOTE) The Xpert Xpress SARS-CoV-2/FLU/RSV plus assay is intended as an aid in the diagnosis of influenza from Nasopharyngeal swab specimens and should not be used as a sole basis for treatment. Nasal washings and aspirates are unacceptable for Xpert Xpress SARS-CoV-2/FLU/RSV testing.  Fact Sheet for Patients: BloggerCourse.com  Fact Sheet for Healthcare Providers: SeriousBroker.it  This test is not yet approved or cleared by the [place]  FDA and has been authorized for detection and/or diagnosis of SARS-CoV-2 by FDA under an Emergency Use Authorization (EUA). This EUA will remain in effect (meaning this test can be used) for the duration  of the COVID-19 declaration under Section 564(b)(1) of the Act, 21 U.S.C. section 360bbb-3(b)(1), unless the authorization is terminated or revoked.     Resp Syncytial Virus by PCR NEGATIVE NEGATIVE Final    Comment: (NOTE) Fact Sheet for Patients: BloggerCourse.com  Fact Sheet for Healthcare Providers: SeriousBroker.it  This test is not yet approved or cleared by the [place]  FDA and has been authorized for detection and/or diagnosis of SARS-CoV-2 by FDA under an Emergency Use Authorization (EUA). This EUA will remain in effect (meaning this test can be used) for the duration of the COVID-19 declaration under Section 564(b)(1) of the Act, 21 U.S.C. section 360bbb-3(b)(1), unless the authorization is terminated or revoked.  Performed at [facility], [address],  [place], [place] [address]     Imaging reviewed:  [facility] Chest [facility] 1 View Result Date: 07/11/2024 CLINICAL DATA:  sob pt coming from home arrives on C-pap. C/o respiratory distress over past 3-4 days. Takes lasix  and hydrochlorothiazide  daily with no missing doses. Ems reports diminished upper lung sounds and absent lower lung sounds EXAM: PORTABLE CHEST 1 VIEW COMPARISON:  Chest x-ray 06/12/2024, CT chest 12/04/2023 FINDINGS: The heart and mediastinal contours are unchanged. Redemonstration of left mid lung zone linear atelectasis versus scarring. No focal consolidation. No pulmonary edema. No pleural effusion. No pneumothorax. No acute osseous abnormality. IMPRESSION: No active disease. Electronically Signed   By: Morgane  Naveau M.D.   On: 07/11/2024 19:20    EKG:   Reviewed, SR, LAD, no acute iscehmic changes.   ED Course:  Arrived on BiPAP with EMS, transitioned to home 4L in the ED. Treated with solumedrol, nebs, Mg.    Assessment/Plan:  63 y.o. 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 requiring BiPAP with EMS -> currently on 4L (Reportedly has increased home O2 to 4; was 3L last admit), resp distress improved. However remains very symptomatic prompting admission. CXR without acute process; chronic L mid lungzone suspected scarring.  -Continue prednisone  40 mg daily for total 5 days -Levofloxacin  500 mg daily  -Check RVP, sputum culture, VBG   -Continue home Breztri , DuoNebs every 6 hours scheduled, albuterol  every 4 hours as needed, incentive spirometer, flutter valve, encourage out of bed to chair. -Home O2 evaluation prior to discharge -Consider referral to pulmonary rehab -Smoking cessation per below   Smoking cessation  Currently smoking 2 cigarettes per day. He is interested in quitting completely -- Rx for nicotine  gum prn, provide at discharge.  -- Counseled extensively on smoking cessation    Chronic medical problems:  Stage IV poorly differentiated NSCLC: Followed with [facility], Initially Dx L lung and completed immunotherapy, resection, radiation, more recently with R lung met v synchronus primary and treated with SBRT. No recent f/u with oncology; should reestablish outpatient.  HFpEF: Without acute exacerbation, on hydrochlorothiazide  for HTN / fluid mgmt  HTN: Continue home hydrochlorothiazide   Chronic pain: Last fill of Oxycodone  15 mg q 8 hr prn was on 7/16 for #90 tab, 30 day supply; Reports he  is now off oxycodone , removed from list.   Substance use disorder (cocaine): Send UDS Mood d/o: Continue home Clonazepam  1 mg BID, fill appropriate.   Body mass index is 34.87 kg/m. Obesity class I    DVT prophylaxis:  Lovenox  Code Status:  Full Code Diet:  Diet Orders (From admission, onward)    None      Family Communication:  None   Consults:  None   Admission status:   Inpatient, Telemetry bed  Severity of Illness: The appropriate patient status for this patient is INPATIENT. Inpatient status is judged to be reasonable and necessary in order to provide the required intensity of service to ensure the patient's safety. The patient's presenting symptoms, physical exam findings, and initial radiographic and laboratory data in the context of their chronic comorbidities is felt to place them at high risk for further clinical deterioration. Furthermore, it is not anticipated that the patient will be medically  stable for discharge from the hospital within 2 midnights of admission.   * I certify that at the point of admission it is my clinical judgment that the patient will require inpatient hospital care spanning beyond 2 midnights from the point of admission due to high intensity of service, high risk for further deterioration and high frequency of surveillance required.*   Dorn Dawson, MD [facility]  How to contact the [facility] Attending or Consulting provider 7A - 7P or covering provider during after hours 7P -7A, for this patient.  Check the care team in [facility] and look for a) attending/consulting [facility] provider listed and b) the [facility] team listed Log into [email or web address] and use [place]'s universal password to access. If you do not have the password, please contact the hospital operator. Locate the [facility] provider you are looking for under [facility] and page to a number that you can be directly reached. If you still have difficulty reaching the provider, please page the [facility] (Director on Call) for the Hospitalists listed on amion for assistance.  07/11/2024, 9:04 PM

## 2024-07-11 NOTE — Progress Notes (Signed)
 Went to view patient off Bipap.  Patient is currently on 4L with sat of 94%.  Patient states he does fine when he is not moving, but has trouble when he moves.  Will continue to monitor.

## 2024-07-11 NOTE — ED Notes (Signed)
 Patient taken off bipap and placed on 4l per EDP

## 2024-07-11 NOTE — ED Triage Notes (Addendum)
 Per [facility] pt coming from assisted living ([facility]) arrives on C-pap. C/o respiratory distress over past 3-4 days. Takes lasix  and hydrochlorothiazide  daily with no missing doses. Ems reports diminished upper lung sounds and absent lower lung sounds. Initial oxygen  saturation 70% RA and placed on C-pap. Patient alert on arrival.

## 2024-07-11 NOTE — ED Notes (Signed)
 Per EDP since patient is tolerating being off bi-pap-- ok to give drink and snack. Gave patient ice water and graham crackers.

## 2024-07-11 NOTE — ED Notes (Signed)
 Miguel Marsh [phone]- patients caretaker at [facility].

## 2024-07-12 ENCOUNTER — Inpatient Hospital Stay (HOSPITAL_COMMUNITY)

## 2024-07-12 DIAGNOSIS — Z716 Tobacco abuse counseling: Secondary | ICD-10-CM | POA: Diagnosis not present

## 2024-07-12 DIAGNOSIS — J441 Chronic obstructive pulmonary disease with (acute) exacerbation: Secondary | ICD-10-CM

## 2024-07-12 LAB — CBC
HCT: 38.7 % — ABNORMAL LOW (ref 39.0–52.0)
Hemoglobin: 12.2 g/dL — ABNORMAL LOW (ref 13.0–17.0)
MCH: 29.2 pg (ref 26.0–34.0)
MCHC: 31.5 g/dL (ref 30.0–36.0)
MCV: 92.6 fL (ref 80.0–100.0)
Platelets: 171 K/uL (ref 150–400)
RBC: 4.18 MIL/uL — ABNORMAL LOW (ref 4.22–5.81)
RDW: 11.9 % (ref 11.5–15.5)
WBC: 5.4 K/uL (ref 4.0–10.5)
nRBC: 0 % (ref 0.0–0.2)

## 2024-07-12 LAB — BASIC METABOLIC PANEL WITH GFR
Anion gap: 9 (ref 5–15)
BUN: 13 mg/dL (ref 8–23)
CO2: 36 mmol/L — ABNORMAL HIGH (ref 22–32)
Calcium: 9.1 mg/dL (ref 8.9–10.3)
Chloride: 94 mmol/L — ABNORMAL LOW (ref 98–111)
Creatinine, Ser: 0.71 mg/dL (ref 0.61–1.24)
GFR, Estimated: >60 mL/min (ref >60)
Glucose, Bld: 148 mg/dL — ABNORMAL HIGH (ref 70–99)
Potassium: 4.7 mmol/L (ref 3.5–5.1)
Sodium: 139 mmol/L (ref 135–145)

## 2024-07-12 LAB — PROCALCITONIN: Procalcitonin: <0.1 ng/mL

## 2024-07-12 LAB — PHOSPHORUS: Phosphorus: 3.4 mg/dL (ref 2.5–4.6)

## 2024-07-12 LAB — MAGNESIUM: Magnesium: 2 mg/dL (ref 1.7–2.4)

## 2024-07-12 MED ORDER — DM-GUAIFENESIN ER 30-600 MG PO TB12
1.0000 | ORAL_TABLET | Freq: Two times a day (BID) | ORAL | Status: DC
  Administered 2024-07-12 – 2024-07-15 (×7): 1 via ORAL
  Filled 2024-07-12 (×7): qty 1

## 2024-07-12 MED ORDER — IPRATROPIUM-ALBUTEROL 0.5-2.5 (3) MG/3ML IN SOLN
3.0000 mL | Freq: Three times a day (TID) | RESPIRATORY_TRACT | Status: DC
  Administered 2024-07-13 – 2024-07-15 (×8): 3 mL via RESPIRATORY_TRACT
  Filled 2024-07-12 (×8): qty 3

## 2024-07-12 MED ORDER — ACETAMINOPHEN 325 MG PO TABS
650.0000 mg | ORAL_TABLET | Freq: Four times a day (QID) | ORAL | Status: DC | PRN
  Administered 2024-07-12: 650 mg via ORAL
  Filled 2024-07-12: qty 2

## 2024-07-12 MED ORDER — LORAZEPAM 2 MG/ML IJ SOLN
1.0000 mg | Freq: Once | INTRAMUSCULAR | Status: AC
  Administered 2024-07-12: 1 mg via INTRAVENOUS
  Filled 2024-07-12: qty 1

## 2024-07-12 MED ORDER — GADOBUTROL 1 MMOL/ML IV SOLN
10.0000 mL | Freq: Once | INTRAVENOUS | Status: AC | PRN
  Administered 2024-07-12: 10 mL via INTRAVENOUS

## 2024-07-12 NOTE — ED Notes (Signed)
 Gave patient diet soda and graham crackers.

## 2024-07-12 NOTE — ED Provider Notes (Signed)
 [facility] MEDICAL SURGICAL UNIT Provider Note   CSN: [id number] Arrival date & time: 07/11/24  1750     Patient presents with: Respiratory Distress   Miguel Marsh is a 63 y.o. male.   Patient complains of shortness of breath.  Patient has a history of COPD.  Patient was placed on CPAP by the paramedics because his initial sats were 70%  The history is provided by the patient and medical records. No language interpreter was used.  Shortness of Breath Severity:  Moderate Onset quality:  Sudden Timing:  Constant Progression:  Waxing and waning Chronicity:  New Context: activity   Relieved by:  Nothing Worsened by:  Nothing Associated symptoms: no abdominal pain, no chest pain, no cough, no headaches and no rash        Prior to Admission medications   Medication Sig Start Date End Date Taking? Authorizing Provider  acetaminophen  (TYLENOL ) 325 MG tablet Take 2 tablets (650 mg total) by mouth every 6 (six) hours as needed for mild pain (pain score 1-3) (or Fever >/= 101). 01/31/24  Yes Emokpae, Courage, MD  albuterol  (PROVENTIL ) (2.5 MG/3ML) 0.083% nebulizer solution Inhale 3 mLs (2.5 mg total) into the lungs every 6 (six) hours as needed for wheezing or shortness of breath. 01/31/24  Yes Pearlean Manus, MD  albuterol  (VENTOLIN  HFA) 108 (90 Base) MCG/ACT inhaler Inhale 2 puffs into the lungs every 6 (six) hours as needed for shortness of breath or wheezing (Cough). 01/31/24  Yes Emokpae, Courage, MD  BREZTRI  AEROSPHERE 160-9-4.8 MCG/ACT AERO Inhale 2 puffs into the lungs 2 (two) times daily. 01/31/24  Yes Emokpae, Courage, MD  clonazePAM  (KLONOPIN ) 1 MG tablet Take 1 tablet (1 mg total) by mouth 2 (two) times daily as needed for anxiety. 11/01/23  Yes Emokpae, Courage, MD  hydrochlorothiazide  (HYDRODIURIL ) 25 MG tablet Take 25 mg by mouth every morning.   Yes [provider]  omeprazole  (PRILOSEC) 40 MG capsule Take 1 capsule (40 mg total) by mouth in the morning and at  bedtime. 03/06/24  Yes Ricky Fines, MD  OXYGEN  Inhale 4 L into the lungs continuous.    [provider]    Allergies: Ibuprofen     Review of Systems  Constitutional:  Negative for appetite change and fatigue.  HENT:  Negative for congestion, ear discharge and sinus pressure.   Eyes:  Negative for discharge.  Respiratory:  Positive for shortness of breath. Negative for cough.   Cardiovascular:  Negative for chest pain.  Gastrointestinal:  Negative for abdominal pain and diarrhea.  Genitourinary:  Negative for frequency and hematuria.  Musculoskeletal:  Negative for back pain.  Skin:  Negative for rash.  Neurological:  Negative for seizures and headaches.  Psychiatric/Behavioral:  Negative for hallucinations.     Updated Vital Signs BP 129/77 (BP Location: Right Arm)   Pulse 84   Temp 98.6 F (37 C) (Oral)   Resp 20   Ht 5' 6 (1.676 m)   Wt 102.4 kg   SpO2 97%   BMI 36.44 kg/m   Physical Exam Vitals and nursing note reviewed.  Constitutional:      Appearance: He is well-developed.  HENT:     Head: Normocephalic.     Nose: Nose normal.  Eyes:     General: No scleral icterus.    Conjunctiva/sclera: Conjunctivae normal.  Neck:     Thyroid: No thyromegaly.  Cardiovascular:     Rate and Rhythm: Normal rate and regular rhythm.     Heart  sounds: No murmur heard.    No friction rub. No gallop.  Pulmonary:     Breath sounds: No stridor. Wheezing present. No rales.  Chest:     Chest wall: No tenderness.  Abdominal:     General: There is no distension.     Tenderness: There is no abdominal tenderness. There is no rebound.  Musculoskeletal:        General: Normal range of motion.     Cervical back: Neck supple.  Lymphadenopathy:     Cervical: No cervical adenopathy.  Skin:    Findings: No erythema or rash.  Neurological:     Mental Status: He is alert and oriented to person, place, and time.     Motor: No abnormal muscle tone.     Coordination:  Coordination normal.  Psychiatric:        Behavior: Behavior normal.     (all labs ordered are listed, but only abnormal results are displayed) Labs Reviewed  CBC WITH DIFFERENTIAL/PLATELET - Abnormal; Notable for the following components:      Result Value   Hemoglobin 12.7 (*)    All other components within normal limits  COMPREHENSIVE METABOLIC PANEL WITH GFR - Abnormal; Notable for the following components:   Chloride 92 (*)    CO2 36 (*)    Glucose, Bld 109 (*)    All other components within normal limits  BLOOD GAS, VENOUS - Abnormal; Notable for the following components:   pCO2, Ven 79 (*)    pO2, Ven 53 (*)    Bicarbonate 45.7 (*)    Acid-Base Excess 16.3 (*)    All other components within normal limits  BASIC METABOLIC PANEL WITH GFR - Abnormal; Notable for the following components:   Chloride 94 (*)    CO2 36 (*)    Glucose, Bld 148 (*)    All other components within normal limits  CBC - Abnormal; Notable for the following components:   RBC 4.18 (*)    Hemoglobin 12.2 (*)    HCT 38.7 (*)    All other components within normal limits  I-STAT CHEM 8, ED - Abnormal; Notable for the following components:   Chloride 95 (*)    Glucose, Bld 102 (*)    Calcium, Ion 1.05 (*)    TCO2 38 (*)    All other components within normal limits  RESP PANEL BY RT-PCR (RSV, FLU A&B, COVID)  RVPGX2  EXPECTORATED SPUTUM ASSESSMENT W GRAM STAIN, RFLX TO RESP C  RESPIRATORY PANEL BY PCR  BRAIN NATRIURETIC PEPTIDE  MAGNESIUM   PHOSPHORUS  PROCALCITONIN  RAPID URINE DRUG SCREEN, HOSP PERFORMED  TROPONIN I (HIGH SENSITIVITY)  TROPONIN I (HIGH SENSITIVITY)    EKG: None  Radiology: MR Lumbar Spine W Wo Contrast Result Date: 07/12/2024 CLINICAL DATA:  63 year old male with history of lung cancer. Lower extremity weakness and paresthesia. EXAM: MRI LUMBAR SPINE WITHOUT AND WITH CONTRAST TECHNIQUE: Multiplanar and multiecho pulse sequences of the lumbar spine were obtained without and  with intravenous contrast. CONTRAST:  10mL GADAVIST  GADOBUTROL  1 MMOL/ML IV SOLN COMPARISON:  CT Abdomen and Pelvis 01/25/2018. FINDINGS: Segmentation:  Normal on the prior CT. Alignment: Some straightening of cervical lordosis appears stable since 2019. No significant scoliosis or spondylolisthesis. Vertebrae: Normal background bone marrow signal. Maintained vertebral height. Intact visible sacrum. No vertebral lesion. No abnormal enhancement identified. No marrow edema or evidence of acute osseous abnormality. Conus medullaris and cauda equina: Conus extends to the T12-L1 level. No lower spinal cord or  conus signal abnormality. No suspicious intradural enhancement. No abnormal dural thickening or enhancement identified. Cauda equina nerve roots appear normal. Capacious spinal canal. Paraspinal and other soft tissues: Partially visible chronic and benign left renal cysts (no follow-up imaging recommended). Negative other Visualized abdominal viscera and paraspinal soft tissues are within normal limits. Disc levels: T11-T12 through L1-L2:  Negative. L2-L3:  Mild mostly anterior and lateral disc bulging.  No stenosis. L3-L4: Disc space loss. Mild circumferential disc bulge. Mild posterior element hypertrophy. Endplate spurring. Moderate left (series 5, image 12) and mild right L3 neural foraminal stenosis. No spinal or lateral recess stenosis. L4-L5: Better preserved disc height but similar circumferential disc bulge with endplate spurring. Marked mild to moderate facet and ligament flavum hypertrophy. No spinal or lateral recess stenosis. Bilateral L4 neural foraminal stenosis, moderate on the left and moderate to severe on the right (series 8, image 25). L5-S1: Negative disc. Mild endplate spurring. Mild facet hypertrophy. Mild left L5 neural foraminal stenosis.6 IMPRESSION: 1. No metastatic disease identified. 2. Lumbar disc and endplate degeneration, mild posterior element hypertrophy without spinal or lateral  recess stenosis. But moderate Left L3, L4, and moderate to severe Right L4 nerve level foraminal stenosis. Query associated radiculitis. Electronically Signed   By: VEAR Hurst M.D.   On: 07/12/2024 09:46   DG Chest Port 1 View Result Date: 07/11/2024 CLINICAL DATA:  sob pt coming from home arrives on C-pap. C/o respiratory distress over past 3-4 days. Takes lasix  and hydrochlorothiazide  daily with no missing doses. Ems reports diminished upper lung sounds and absent lower lung sounds EXAM: PORTABLE CHEST 1 VIEW COMPARISON:  Chest x-ray 06/12/2024, CT chest 12/04/2023 FINDINGS: The heart and mediastinal contours are unchanged. Redemonstration of left mid lung zone linear atelectasis versus scarring. No focal consolidation. No pulmonary edema. No pleural effusion. No pneumothorax. No acute osseous abnormality. IMPRESSION: No active disease. Electronically Signed   By: Morgane  Naveau M.D.   On: 07/11/2024 19:20     Procedures   Medications Ordered in the ED  enoxaparin  (LOVENOX ) injection 40 mg (40 mg Subcutaneous Given 07/12/24 0850)  sodium chloride  flush (NS) 0.9 % injection 3 mL (3 mLs Intravenous Given 07/12/24 0850)  ipratropium-albuterol  (DUONEB) 0.5-2.5 (3) MG/3ML nebulizer solution 3 mL (3 mLs Nebulization Given 07/12/24 0834)  albuterol  (PROVENTIL ) (2.5 MG/3ML) 0.083% nebulizer solution 2.5 mg (has no administration in time range)  levofloxacin  (LEVAQUIN ) tablet 500 mg (500 mg Oral Given 07/12/24 0850)  predniSONE  (DELTASONE ) tablet 40 mg (40 mg Oral Given 07/12/24 0850)  hydrochlorothiazide  (HYDRODIURIL ) tablet 25 mg (25 mg Oral Given 07/12/24 0850)  pantoprazole  (PROTONIX ) EC tablet 40 mg (40 mg Oral Given 07/12/24 0849)  clonazePAM  (KLONOPIN ) tablet 1 mg (has no administration in time range)  budesonide -glycopyrrolate -formoterol  (BREZTRI ) 160-9-4.8 MCG/ACT inhaler 2 puff (2 puffs Inhalation Given 07/12/24 0839)  nicotine  polacrilex (NICORETTE ) gum 2 mg (has no administration in time range)   methylPREDNISolone  sodium succinate (SOLU-MEDROL ) 125 mg/2 mL injection 125 mg (125 mg Intravenous Given 07/11/24 1759)  magnesium  sulfate IVPB 2 g 50 mL (0 g Intravenous Stopped 07/11/24 1919)  ipratropium-albuterol  (DUONEB) 0.5-2.5 (3) MG/3ML nebulizer solution 3 mL (3 mLs Nebulization Given 07/11/24 1806)  albuterol  (PROVENTIL ) (2.5 MG/3ML) 0.083% nebulizer solution 2.5 mg (2.5 mg Nebulization Given 07/11/24 1806)  LORazepam  (ATIVAN ) injection 1 mg (1 mg Intravenous Given 07/12/24 0850)  gadobutrol  (GADAVIST ) 1 MMOL/ML injection 10 mL (10 mLs Intravenous Contrast Given 07/12/24 0936)   CRITICAL CARE Performed by: Fairy Sermon Total critical care time: 45 minutes  Critical care time was exclusive of separately billable procedures and treating other patients. Critical care was necessary to treat or prevent imminent or life-threatening deterioration. Critical care was time spent personally by me on the following activities: development of treatment plan with patient and/or surrogate as well as nursing, discussions with consultants, evaluation of patient's response to treatment, examination of patient, obtaining history from patient or surrogate, ordering and performing treatments and interventions, ordering and review of laboratory studies, ordering and review of radiographic studies, pulse oximetry and re-evaluation of patient's condition.                                  Medical Decision Making Amount and/or Complexity of Data Reviewed Labs: ordered. Radiology: ordered. ECG/medicine tests: ordered.  Risk Prescription drug management. Decision regarding hospitalization.   COPD exacerbation.  Patient will be admitted to medicine     Final diagnoses:  COPD exacerbation [facility])    ED Discharge Orders     None          Suzette Pac, MD 07/12/24 1110

## 2024-07-12 NOTE — TOC Initial Note (Signed)
 Transition of Care [facility]) - Initial/Assessment Note    Patient Details  Name: Miguel Marsh MRN: [id number] Date of Birth: 1961-05-04  [facility] CM/SW Contact:    Nena LITTIE Coffee, RN Phone Number: 07/12/2024, 4:29 PM  Clinical Narrative:                 Pt assessed for high readmission score. Lives at [facility] [phone]) x6 months. Has [employer] oxygen , nebulizer, wc and cane. Pt plans to return to ALF at dc.   [employer]  enrollment application faxed to [phone]. Fax confirmation received.   Expected Discharge Plan: Assisted Living Barriers to Discharge: Continued Medical Work up   Patient Goals and CMS Choice Patient states their goals for this hospitalization and ongoing recovery are:: Return home          Expected Discharge Plan and Services In-house Referral: Clinical Social Work Discharge Planning Services: CM Consult   Living arrangements for the past 2 months: Assisted Living Facility                                      Prior Living Arrangements/Services Living arrangements for the past 2 months: Assisted Living Facility Lives with:: Facility Resident Patient language and need for interpreter reviewed:: Yes Do you feel safe going back to the place where you live?: Yes      Need for Family Participation in Patient Care: Yes (Comment) Care giver support system in place?: Yes (comment) Current home services: DME ([employer] oxygen  and POC, nebulizer, wc, cane) Criminal Activity/Legal Involvement Pertinent to Current Situation/Hospitalization: No - Comment as needed  Activities of Daily Living      Permission Sought/Granted                  Emotional Assessment Appearance:: Appears stated age Attitude/Demeanor/Rapport: Engaged Affect (typically observed): Appropriate Orientation: : Oriented to Self, Oriented to Place, Oriented to  Time, Oriented to Situation Alcohol  / Substance Use: Not Applicable Psych  Involvement: No (comment)  Admission diagnosis:  COPD exacerbation (HCC) [J44.1] Patient Active Problem List   Diagnosis Date Noted   Acute respiratory failure (HCC) 04/03/2024   Acute on chronic respiratory failure (HCC) 03/04/2024   Anxiety 01/15/2024   Cocaine abuse (HCC) 09/13/2023   CAP (community acquired pneumonia) 06/24/2023   Lung nodule 06/24/2023   COVID-19 virus infection 06/24/2023   Elevated MCV 03/06/2023   Acute metabolic encephalopathy 02/01/2023   Leukocytosis 10/06/2020   Goals of care, counseling/discussion    Palliative care by specialist    Encounter for smoking cessation counseling    Hyperglycemia 04/12/2020   Chronic respiratory failure with hypoxia and hypercapnia (HCC) 01/22/2020   Acute on chronic respiratory failure with hypoxia and hypercapnia (HCC) 01/23/2018   Obesity, Class III, BMI 40-49.9 (morbid obesity) 01/01/2018   Acute exacerbation of chronic obstructive pulmonary disease (COPD) (HCC) 01/01/2018   Essential hypertension 12/09/2015   Non-small cell cancer of right lung (in remission) 12/09/2015   GERD (gastroesophageal reflux disease) 12/09/2015   Tobacco use disorder 12/17/2014   COPD exacerbation (HCC) 12/17/2014   Obesity 12/17/2014   Dyspnea    Difficulty walking 04/02/2013   Hip weakness 04/02/2013   PCP:  Renato Dorothey HERO, NP Pharmacy:   [facility], [place] - [address] [place] [address] Phone: [phone] Fax: [phone]  [facility] [place], [place] [address] [place] [address] Phone: [phone] Fax: [phone]     Social Drivers of Health (SDOH) Social History: SDOH Screenings   Food Insecurity: No Food Insecurity (05/18/2024)  Housing: Low Risk  (05/18/2024)  Transportation Needs: No Transportation Needs (05/18/2024)  Utilities: Not At Risk (05/18/2024)  Tobacco Use: High Risk (07/11/2024)   SDOH Interventions:     Readmission Risk  Interventions    07/12/2024    4:22 PM 05/20/2024    9:57 AM 04/02/2024    2:02 PM  Readmission Risk Prevention Plan  Transportation Screening Complete Complete Complete  Medication Review [employer]) Complete Complete Complete  PCP or Specialist appointment within 3-5 days of discharge Complete    HRI or Home Care Consult Complete Complete Complete  SW Recovery Care/Counseling Consult Complete Complete Complete  Palliative Care Screening Not Applicable Not Applicable Not Applicable  Skilled Nursing Facility Not Applicable Not Applicable Not Applicable

## 2024-07-12 NOTE — Progress Notes (Signed)
 Progress Note   Patient: Miguel Marsh FMW:[id number] DOB: 1961-08-13 DOA: 07/11/2024     1 DOS: the patient was seen and examined on 07/12/2024   Brief hospital admission narrative: As per H&P written by Dr. Keturah on 07/11/2024 Miguel Marsh is a 63 y.o. 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    Reports SOB for few days, but suddenly worsened today and was increasingly SOB at rest. Typically he can go about 30 ft+ without stopping, had dropped to 8ft and then symptomatic at rest today. He has thick secretions that he has difficulty clearing. Otherwise notes intermittent sensation of chest tightness which is nonexertional. No other URI symptoms. No Fever or chills. No sick contacts. No allergy exposures. He has inhalers at home which he is using. He is still smoking about 2 cigarettes per day. No other drug use.    Otherwise notes has had about 1 month of bilateral thigh paresthesias/dysesthesia, burning. Does not radiate down his leg, feels more weak in his LE over past 2 weeks. No incontinence or saddle anesthesia   Assessment and plan 1-acute on chronic respiratory failure with hypoxia - Patient reports using 2-3 L nasal cannula supplementation at baseline; up to 4 L currently to maintain saturation. - Appears to be secondary to bronchiectasis along with COPD exacerbation - Continue nebulizer management, steroid, antibiotics, mucolytic's and the use of flutter valve - Wean off oxygen  supplementation as tolerated - Follow clinical response.  2-tobacco abuse - Smoking cessation and complete abstinence recommended - Continue nicotine  gum  3-GERD/GI  prophylaxis - Continue PPI  4-stage IV no M small cell lung cancer - Continue outpatient follow-up with [facility] oncology service  5-chronic diastolic heart failure - Stable and compensated - Continue to follow strict I's and O's/daily weights - Continue current diuretic therapy.  6-chronic pain syndrome - Continue home analgesic therapy.  7-mood disorder - Continue clonazepam   8-hypertension - Follow vital signs and continue home antihypertensive agent - Heart healthy/low-sodium diet discussed with patient.  9-class II obesity -Body mass index is 36.44 kg/m.  -Low-calorie diet, portion control and increase physical activity discussed with patient.  10-lower extremity weakness/tingling - Follow-up lumbar MRI - Continue as needed analgesics - As needed muscle relaxant has also been added.  Subjective:  Reporting short winded sensation and mild difficulty speaking in full sentences.  Still requiring higher level of oxygen  supplementation than his baseline.  No fever, no chest pain, no nausea, no vomiting.  Physical Exam: Vitals:   07/12/24 0834 07/12/24 0839 07/12/24 1230 07/12/24 1329  BP:   117/62   Pulse:   83   Resp:   18   Temp:   98.5 F (36.9 C)   TempSrc:   Oral   SpO2: 97% 97% 96% 97%  Weight:      Height:       General exam: Alert, awake, oriented x 3; short winded with activity and demonstrating mild difficulty speaking in full sentences during examination. Respiratory system: Positive rhonchi bilaterally; expiratory wheezing appreciated on exam.  No using accessory muscles. Cardiovascular system:RRR. No murmurs, rubs, gallops. Gastrointestinal system: Abdomen is obese, nondistended, soft and nontender. No organomegaly or masses felt. Normal  bowel sounds heard. Central nervous system: Alert and oriented. No focal neurological deficits. Extremities: No cyanosis or clubbing. Skin: No petechiae. Psychiatry: Judgement and insight appear normal. Mood &  affect appropriate.    Data Reviewed: Procalcitonin: <0.10 CBC: WBCs 5.4, hemoglobin 12.2 and platelet count 171K Basic metabolic panel: Sodium 139, potassium 4.7, chloride 94, bicarb 36, BUN 13, creatinine 0.71 and GFR >60  Family Communication: No family at bedside.  Disposition: Status is: Inpatient Remains inpatient appropriate because: Continue IV therapy and bronchodilator management.  Anticipating discharge back home once medically stable.  Time spent: 50 minutes  Author: Eric Nunnery, MD 07/12/2024 1:56 PM  For on call review [email or web address].

## 2024-07-12 NOTE — Plan of Care (Signed)

## 2024-07-13 DIAGNOSIS — Z716 Tobacco abuse counseling: Secondary | ICD-10-CM | POA: Diagnosis not present

## 2024-07-13 DIAGNOSIS — J441 Chronic obstructive pulmonary disease with (acute) exacerbation: Secondary | ICD-10-CM | POA: Diagnosis not present

## 2024-07-13 LAB — RESPIRATORY PANEL BY PCR

## 2024-07-13 LAB — EXPECTORATED SPUTUM ASSESSMENT W GRAM STAIN, RFLX TO RESP C

## 2024-07-13 MED ORDER — GABAPENTIN 100 MG PO CAPS
100.0000 mg | ORAL_CAPSULE | Freq: Two times a day (BID) | ORAL | Status: DC
  Administered 2024-07-13 – 2024-07-15 (×4): 100 mg via ORAL
  Filled 2024-07-13 (×4): qty 1

## 2024-07-13 MED ORDER — ARFORMOTEROL TARTRATE 15 MCG/2ML IN NEBU
15.0000 ug | INHALATION_SOLUTION | Freq: Two times a day (BID) | RESPIRATORY_TRACT | Status: DC
  Administered 2024-07-13 – 2024-07-15 (×4): 15 ug via RESPIRATORY_TRACT
  Filled 2024-07-13 (×4): qty 2

## 2024-07-13 MED ORDER — METHYLPREDNISOLONE SODIUM SUCC 40 MG IJ SOLR
40.0000 mg | Freq: Two times a day (BID) | INTRAMUSCULAR | Status: DC
  Administered 2024-07-13 – 2024-07-15 (×4): 40 mg via INTRAVENOUS
  Filled 2024-07-13 (×4): qty 1

## 2024-07-13 MED ORDER — BUDESONIDE 0.5 MG/2ML IN SUSP
0.5000 mg | Freq: Two times a day (BID) | RESPIRATORY_TRACT | Status: DC
Start: 2024-07-13 — End: 2024-07-15
  Administered 2024-07-13 – 2024-07-15 (×4): 0.5 mg via RESPIRATORY_TRACT
  Filled 2024-07-13 (×4): qty 2

## 2024-07-13 NOTE — Plan of Care (Signed)

## 2024-07-13 NOTE — Progress Notes (Signed)
 Progress Note   Patient: Miguel Marsh:[id number] DOB: [birth year] DOA: 07/11/2024     2 DOS: the patient was seen and examined on 07/13/2024   Brief hospital admission narrative: As per H&P written by Dr. Keturah on 07/11/2024 Miguel Marsh is a 63 y.o. 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    Reports SOB for few days, but suddenly worsened today and was increasingly SOB at rest. Typically he can go about 30 ft+ without stopping, had dropped to 29ft and then symptomatic at rest today. He has thick secretions that he has difficulty clearing. Otherwise notes intermittent sensation of chest tightness which is nonexertional. No other URI symptoms. No Fever or chills. No sick contacts. No allergy exposures. He has inhalers at home which he is using. He is still smoking about 2 cigarettes per day. No other drug use.    Otherwise notes has had about 1 month of bilateral thigh paresthesias/dysesthesia, burning. Does not radiate down his leg, feels more weak in his LE over past 2 weeks. No incontinence or saddle anesthesia   Assessment and plan 1-acute on chronic respiratory failure with hypoxia - Patient reports using 2-3 L nasal cannula supplementation at baseline; up to 4 L currently to maintain saturation. - Appears to be secondary to bronchiectasis along with COPD exacerbation - Continue nebulizer management, steroid, antibiotics, mucolytic's and the use of flutter valve. -Given ongoing symptoms steroid transition to IV route and will start Pulmicort  and Brovana . - Wean down oxygen  supplementation to his baseline as tolerated. - Follow clinical  response.  2-tobacco abuse - Smoking cessation and complete abstinence recommended - Continue nicotine  gum  3-GERD/GI prophylaxis - Continue PPI  4-stage IV no M small cell lung cancer - Continue outpatient follow-up with [facility] oncology service  5-chronic diastolic heart failure - Stable and compensated - Continue to follow strict I's and O's/daily weights - Continue current diuretic therapy. - Low-sodium diet discussed with patient.  6-chronic pain syndrome - Continue home analgesic therapy.  7-mood disorder - Continue clonazepam   8-hypertension - Follow vital signs and continue home antihypertensive agent - Heart healthy/low-sodium diet discussed with patient.  9-class II obesity -Body mass index is 36.44 kg/m.  -Low-calorie diet, portion control and increase physical activity discussed with patient.  10-lower extremity weakness/tingling - MRI demonstrating degenerative changes and narrowing around L3-L4 with concern for possible nerve irritation in that area - Neurontin  twice daily will be started - Continue as needed analgesics and muscle relaxant.  Subjective:  Demonstrating difficulty to speak in full sentences; expressed not feeling well and experiences short winded sensation with activity.  Physical Exam: Vitals:   07/13/24 0612 07/13/24 0736 07/13/24 1210 07/13/24 1316  BP: (!) 151/75   (!) 145/73  Pulse: 70   85  Resp: 18     Temp: 97.7 F (36.5 C)   98 F (36.7 C)  TempSrc: Oral   Axillary  SpO2: 100% 100% 99% 100%  Weight:      Height:       General exam: Alert, awake, oriented x 3; demonstrating difficulty speaking in full sentences and complaining of few winded sensation with activity. Respiratory system: Diffuse expiratory wheezing and scattered  rhonchi appreciated on exam. Cardiovascular system:RRR. No murmurs, rubs, gallops. Gastrointestinal system: Abdomen is obese, nondistended, soft and nontender. No organomegaly or masses felt.  Normal bowel sounds heard. Central nervous system: No focal neurological deficits. Extremities: No cyanosis or clubbing; no edema. Skin: No petechiae. Psychiatry: Judgement and insight appear normal. Mood & affect appropriate.   Latest data Reviewed: Procalcitonin: <0.10 CBC: WBCs 5.4, hemoglobin 12.2 and platelet count 171K Basic metabolic panel: Sodium 139, potassium 4.7, chloride 94, bicarb 36, BUN 13, creatinine 0.71 and GFR >60 Respiratory viral panel: Negative.  Family Communication: No family at bedside.  Disposition: Status is: Inpatient Remains inpatient appropriate because: Continue IV therapy and bronchodilator management.  Anticipating discharge back home once medically stable.   Time spent: 50 minutes  Author: Eric Nunnery, MD 07/13/2024 6:22 PM  For on call review [email or web address].

## 2024-07-14 DIAGNOSIS — J441 Chronic obstructive pulmonary disease with (acute) exacerbation: Secondary | ICD-10-CM | POA: Diagnosis not present

## 2024-07-14 DIAGNOSIS — Z716 Tobacco abuse counseling: Secondary | ICD-10-CM | POA: Diagnosis not present

## 2024-07-14 NOTE — Progress Notes (Signed)
 Progress Note   Patient: Miguel Marsh FMW:[id number] DOB: 1961/05/24 DOA: 07/11/2024     3 DOS: the patient was seen and examined on 07/14/2024   Brief hospital admission narrative: As per H&P written by Dr. Keturah on 07/11/2024 Miguel Marsh is a 63 y.o. 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    Reports SOB for few days, but suddenly worsened today and was increasingly SOB at rest. Typically he can go about 30 ft+ without stopping, had dropped to 16ft and then symptomatic at rest today. He has thick secretions that he has difficulty clearing. Otherwise notes intermittent sensation of chest tightness which is nonexertional. No other URI symptoms. No Fever or chills. No sick contacts. No allergy exposures. He has inhalers at home which he is using. He is still smoking about 2 cigarettes per day. No other drug use.    Otherwise notes has had about 1 month of bilateral thigh paresthesias/dysesthesia, burning. Does not radiate down his leg, feels more weak in his LE over past 2 weeks. No incontinence or saddle anesthesia   Assessment and plan 1-acute on chronic respiratory failure with hypoxia - Patient reports using 2-3 L nasal cannula supplementation at baseline; up to 4 L currently to maintain saturation. - Appears to be secondary to bronchiectasis along with COPD exacerbation - Continue nebulizer management, steroid, antibiotics, mucolytic's and the use of flutter valve. - Will continue treatment with IV steroids, Pulmicort , Brovana  and DuoNeb. -Follow clinical response; hopefully discharge home in the next 24 hours. - Continue to wean down oxygen  supplementation  to his baseline as tolerated.  2-tobacco abuse - Smoking cessation and complete abstinence recommended - Continue nicotine  gum  3-GERD/GI prophylaxis - Continue PPI  4-stage IV no M small cell lung cancer - Continue outpatient follow-up with [facility] oncology service  5-chronic diastolic heart failure - Stable and compensated - Continue to follow strict I's and O's/daily weights - Continue current diuretic therapy. - Low-sodium diet discussed with patient.  6-chronic pain syndrome - Continue home analgesic therapy.  7-mood disorder - Continue clonazepam   8-hypertension - Follow vital signs and continue home antihypertensive agent - Heart healthy/low-sodium diet discussed with patient.  9-class II obesity -Body mass index is 36.44 kg/m.  -Low-calorie diet, portion control and increase physical activity discussed with patient.  10-lower extremity weakness/tingling - MRI demonstrating degenerative changes and narrowing around L3-L4 with concern for possible nerve irritation in that area - Neurontin  twice daily will be started - Continue as needed analgesics and muscle relaxant.  Subjective:  Expressing improvement in his symptoms; still short winded with activity.  Reports less discomfort for/numbness in his lower extremity.  Physical Exam: Vitals:   07/14/24 0505 07/14/24 0740 07/14/24 1342 07/14/24 1351  BP: (!) 144/76  116/72   Pulse: 65  72   Resp: 16  18   Temp: 98.1 F (36.7 C)  99 F (37.2 C)   TempSrc: Oral  Oral   SpO2: 98% 98% 98% 96%  Weight:      Height:       General exam: Alert, awake, oriented x 3; still short winded with activity but expressed breathing better.  No chest pain, no nausea,  no vomiting. Respiratory system: Expiratory wheezing appreciated on exam; no using accessory muscle. Cardiovascular system:RRR. No rubs or gallops; no JVD. Gastrointestinal system: Abdomen is obese, nondistended, soft and nontender. No organomegaly or masses  felt. Normal bowel sounds heard. Central nervous system:  No focal neurological deficits. Extremities: No cyanosis or clubbing. Skin: No petechiae. Psychiatry: Judgement and insight appear normal. Mood & affect appropriate.   Latest data Reviewed: Procalcitonin: <0.10 CBC: WBCs 5.4, hemoglobin 12.2 and platelet count 171K Basic metabolic panel: Sodium 139, potassium 4.7, chloride 94, bicarb 36, BUN 13, creatinine 0.71 and GFR >60 Respiratory viral panel: Negative.  Family Communication: No family at bedside.  Disposition: Status is: Inpatient Remains inpatient appropriate because: Continue IV therapy and bronchodilator management.  Anticipating discharge back home once medically stable.   Time spent: 50 minutes  Author: Eric Nunnery, MD 07/14/2024 5:03 PM  For on call review [email or web address].

## 2024-07-14 NOTE — TOC Progression Note (Signed)
 Transition of Care [facility]) - Progression Note    Patient Details  Name: Miguel Marsh MRN: [id number] Date of Birth: 09-02-1961  Transition of Care [facility]) CM/SW Contact  Lucie Lunger, [place] Phone Number: 07/14/2024, 3:40 PM  Clinical Narrative:    CSW attempted to reach [facility] with [facility] to follow up and provide update. CSW left VM and requested call back. TOC to follow.   Expected Discharge Plan: Assisted Living Barriers to Discharge: Continued Medical Work up               Expected Discharge Plan and Services In-house Referral: Clinical Social Work Discharge Planning Services: CM Consult   Living arrangements for the past 2 months: Assisted Living Facility                                       Social Drivers of Health (SDOH) Interventions SDOH Screenings   Food Insecurity: No Food Insecurity (07/13/2024)  Housing: High Risk (07/13/2024)  Transportation Needs: No Transportation Needs (07/13/2024)  Utilities: Not At Risk (07/13/2024)  Tobacco Use: High Risk (07/11/2024)    Readmission Risk Interventions    07/12/2024    4:22 PM 05/20/2024    9:57 AM 04/02/2024    2:02 PM  Readmission Risk Prevention Plan  Transportation Screening Complete Complete Complete  Medication Review Oceanographer) Complete Complete Complete  PCP or Specialist appointment within 3-5 days of discharge Complete    HRI or Home Care Consult Complete Complete Complete  SW Recovery Care/Counseling Consult Complete Complete Complete  Palliative Care Screening Not Applicable Not Applicable Not Applicable  Skilled Nursing Facility Not Applicable Not Applicable Not Applicable

## 2024-07-15 DIAGNOSIS — J441 Chronic obstructive pulmonary disease with (acute) exacerbation: Secondary | ICD-10-CM | POA: Diagnosis not present

## 2024-07-15 DIAGNOSIS — Z716 Tobacco abuse counseling: Secondary | ICD-10-CM | POA: Diagnosis not present

## 2024-07-15 MED ORDER — OMEPRAZOLE 40 MG PO CPDR: 40.0000 mg | DELAYED_RELEASE_CAPSULE | Freq: Two times a day (BID) | ORAL | 2 refills | Status: DC

## 2024-07-15 MED ORDER — GABAPENTIN 100 MG PO CAPS: 100.0000 mg | ORAL_CAPSULE | Freq: Two times a day (BID) | ORAL | 2 refills | Status: DC

## 2024-07-15 MED ORDER — DM-GUAIFENESIN ER 30-600 MG PO TB12: 1.0000 | ORAL_TABLET | Freq: Two times a day (BID) | ORAL | 0 refills | Status: AC

## 2024-07-15 MED ORDER — LEVOFLOXACIN 500 MG PO TABS: 500.0000 mg | ORAL_TABLET | ORAL | 0 refills | Status: AC

## 2024-07-15 MED ORDER — PREDNISONE 20 MG PO TABS
ORAL_TABLET | ORAL | 0 refills | Status: DC
Start: 2024-07-15 — End: 2024-08-03

## 2024-07-15 NOTE — Plan of Care (Signed)
   Problem: Education: Goal: Knowledge of General Education information will improve Description Including pain rating scale, medication(s)/side effects and non-pharmacologic comfort measures Outcome: Adequate for Discharge   Problem: Health Behavior/Discharge Planning: Goal: Ability to manage health-related needs will improve Outcome: Adequate for Discharge

## 2024-07-15 NOTE — Progress Notes (Signed)
 Report called to [facility]

## 2024-07-15 NOTE — TOC Progression Note (Signed)
 Transition of Care [facility]) - Progression Note    Patient Details  Name: Miguel Marsh MRN: [id number] Date of Birth: 07-Jan-1961  Transition of Care [facility]) CM/SW Contact  Lucie Lunger, [place] Phone Number: 07/15/2024, 12:11 PM  Clinical Narrative:    CSW updated by MD that pt is medically stable for D/C today back to ALF. CSW has attempted to reach [place] with ALF x4 times today, no answer. Secure text sent requesting call back. TOC to follow.   Expected Discharge Plan: Assisted Living Barriers to Discharge: Continued Medical Work up               Expected Discharge Plan and Services In-house Referral: Clinical Social Work Discharge Planning Services: CM Consult   Living arrangements for the past 2 months: Assisted Living Facility Expected Discharge Date: 07/15/24                                     Social Drivers of Health (SDOH) Interventions SDOH Screenings   Food Insecurity: No Food Insecurity (07/13/2024)  Housing: High Risk (07/13/2024)  Transportation Needs: No Transportation Needs (07/13/2024)  Utilities: Not At Risk (07/13/2024)  Tobacco Use: High Risk (07/11/2024)    Readmission Risk Interventions    07/12/2024    4:22 PM 05/20/2024    9:57 AM 04/02/2024    2:02 PM  Readmission Risk Prevention Plan  Transportation Screening Complete Complete Complete  Medication Review Oceanographer) Complete Complete Complete  PCP or Specialist appointment within 3-5 days of discharge Complete    HRI or Home Care Consult Complete Complete Complete  SW Recovery Care/Counseling Consult Complete Complete Complete  Palliative Care Screening Not Applicable Not Applicable Not Applicable  Skilled Nursing Facility Not Applicable Not Applicable Not Applicable

## 2024-07-15 NOTE — Care Management Important Message (Signed)
 Important Message  Patient Details  Name: Miguel Marsh MRN: [id number] Date of Birth: 1960-12-24   Important Message Given:  Yes - Medicare IM     Miata Culbreth L Brayten Komar 07/15/2024, 11:19 AM

## 2024-07-15 NOTE — TOC Transition Note (Signed)
 Transition of Care [facility]) - Discharge Note   Patient Details  Name: Miguel Marsh MRN: [id number] Date of Birth: [birth year]  Transition of Care [facility]) CM/SW Contact:  Lucie Lunger, LCSWA Phone Number: 07/15/2024, 3:46 PM  Clinical Narrative:    CSW updated that pt is medically stable for D/C back to ALF. CSW spoke to [place] with ALF who states they can accept back today, D/C summary and Fl2 sent via secure email to [facility] for review. RN provided with number to call for report. Randie states they will provide transport for pt. TOC signing off.   Final next level of care: Assisted Living Barriers to Discharge: Barriers Resolved   Patient Goals and CMS Choice Patient states their goals for this hospitalization and ongoing recovery are:: return to ALF CMS Medicare.gov Compare Post Acute Care list provided to:: Patient Choice offered to / list presented to : Patient      Discharge Placement                       Discharge Plan and Services Additional resources added to the After Visit Summary for   In-house Referral: Clinical Social Work Discharge Planning Services: CM Consult                                 Social Drivers of Health (SDOH) Interventions SDOH Screenings   Food Insecurity: No Food Insecurity (07/13/2024)  Housing: High Risk (07/13/2024)  Transportation Needs: No Transportation Needs (07/13/2024)  Utilities: Not At Risk (07/13/2024)  Tobacco Use: High Risk (07/11/2024)     Readmission Risk Interventions    07/12/2024    4:22 PM 05/20/2024    9:57 AM 04/02/2024    2:02 PM  Readmission Risk Prevention Plan  Transportation Screening Complete Complete Complete  Medication Review Oceanographer) Complete Complete Complete  PCP or Specialist appointment within 3-5 days of discharge Complete    HRI or Home Care Consult Complete Complete Complete  SW Recovery Care/Counseling Consult Complete Complete Complete  Palliative Care Screening Not  Applicable Not Applicable Not Applicable  Skilled Nursing Facility Not Applicable Not Applicable Not Applicable

## 2024-07-15 NOTE — NC FL2 (Signed)
 [place]  MEDICAID FL2 LEVEL OF CARE FORM     IDENTIFICATION  Patient Name: Miguel Marsh Birthdate: 1961-10-25 Sex: male Admission Date (Current Location): 07/11/2024  [facility] and [place] Number:  [name] and Address:  [facility],  [address] [place] [address]      Provider Number: [id number]  Attending Physician Name and Address:  Ricky Fines, MD  Relative Name and Phone Number:       Current Level of Care: Hospital Recommended Level of Care: Assisted Living Facility Prior Approval Number:    Date Approved/Denied:   PASRR Number:    Discharge Plan: Other (Comment) (ALF)    Current Diagnoses: Patient Active Problem List   Diagnosis Date Noted   Acute respiratory failure (HCC) 04/03/2024   Acute on chronic respiratory failure (HCC) 03/04/2024   Anxiety 01/15/2024   Cocaine abuse (HCC) 09/13/2023   CAP (community acquired pneumonia) 06/24/2023   Lung nodule 06/24/2023   COVID-19 virus infection 06/24/2023   Elevated MCV 03/06/2023   Acute metabolic encephalopathy 02/01/2023   Leukocytosis 10/06/2020   Goals of care, counseling/discussion    Palliative care by specialist    Encounter for smoking cessation counseling    Hyperglycemia 04/12/2020   Chronic respiratory failure with hypoxia and hypercapnia (HCC) 01/22/2020   Acute on chronic respiratory failure with hypoxia and hypercapnia (HCC) 01/23/2018   Obesity, Class III, BMI 40-49.9 (morbid obesity) 01/01/2018   Acute exacerbation of chronic obstructive pulmonary disease (COPD) (HCC) 01/01/2018   Essential hypertension 12/09/2015   Non-small cell cancer of right lung (in remission) 12/09/2015   GERD (gastroesophageal reflux disease) 12/09/2015   Tobacco use disorder 12/17/2014   COPD exacerbation (HCC) 12/17/2014   Obesity 12/17/2014   Dyspnea    Difficulty walking 04/02/2013   Hip weakness 04/02/2013    Orientation RESPIRATION BLADDER Height & Weight     Self,  Time, Situation, Place  O2 (4L) Continent Weight: 225 lb 12 oz (102.4 kg) Height:  5' 6 (167.6 cm)  BEHAVIORAL SYMPTOMS/MOOD NEUROLOGICAL BOWEL NUTRITION STATUS      Continent Diet (Regular)  AMBULATORY STATUS COMMUNICATION OF NEEDS Skin   Supervision Verbally Normal                       Personal Care Assistance Level of Assistance  Bathing, Dressing, Feeding Bathing Assistance: Limited assistance Feeding assistance: Independent Dressing Assistance: Independent     Functional Limitations Info  Sight, Hearing, Speech Sight Info: Adequate Hearing Info: Adequate Speech Info: Adequate    SPECIAL CARE FACTORS FREQUENCY                       Contractures Contractures Info: Not present    Additional Factors Info  Code Status, Allergies Code Status Info: FULL Allergies Info: Ibuprofen            Current Medications (07/15/2024):  This is the current hospital active medication list Current Facility-Administered Medications  Medication Dose Route Frequency Provider Last Rate Last Admin   acetaminophen  (TYLENOL ) tablet 650 mg  650 mg Oral Q6H PRN Ricky Fines, MD   650 mg at 07/12/24 1425   albuterol  (PROVENTIL ) (2.5 MG/3ML) 0.083% nebulizer solution 2.5 mg  2.5 mg Nebulization Q4H PRN Segars, Jonathan, MD   2.5 mg at 07/13/24 1209   arformoterol  (BROVANA ) nebulizer solution 15 mcg  15 mcg Nebulization BID Ricky Fines, MD   15 mcg at 07/15/24 0947   budesonide  (PULMICORT ) nebulizer solution  0.5 mg  0.5 mg Nebulization BID Ricky Fines, MD   0.5 mg at 07/15/24 9051   clonazePAM  (KLONOPIN ) tablet 1 mg  1 mg Oral BID PRN Segars, Jonathan, MD   1 mg at 07/14/24 2127   dextromethorphan -guaiFENesin  (MUCINEX  DM) 30-600 MG per 12 hr tablet 1 tablet  1 tablet Oral BID Ricky Fines, MD   1 tablet at 07/15/24 9167   enoxaparin  (LOVENOX ) injection 40 mg  40 mg Subcutaneous Q24H Segars, Jonathan, MD   40 mg at 07/15/24 9167   gabapentin  (NEURONTIN ) capsule 100 mg  100 mg  Oral BID Ricky Fines, MD   100 mg at 07/15/24 9166   hydrochlorothiazide  (HYDRODIURIL ) tablet 25 mg  25 mg Oral q morning Segars, Jonathan, MD   25 mg at 07/15/24 9167   ipratropium-albuterol  (DUONEB) 0.5-2.5 (3) MG/3ML nebulizer solution 3 mL  3 mL Nebulization TID Ricky Fines, MD   3 mL at 07/15/24 1437   levofloxacin  (LEVAQUIN ) tablet 500 mg  500 mg Oral Q24H Segars, Jonathan, MD   500 mg at 07/15/24 9166   methylPREDNISolone  sodium succinate (SOLU-MEDROL ) 40 mg/mL injection 40 mg  40 mg Intravenous Q12H Ricky Fines, MD   40 mg at 07/15/24 9167   nicotine  polacrilex (NICORETTE ) gum 2 mg  2 mg Oral PRN Segars, Jonathan, MD       pantoprazole  (PROTONIX ) EC tablet 40 mg  40 mg Oral BID Segars, Jonathan, MD   40 mg at 07/15/24 0833   sodium chloride  flush (NS) 0.9 % injection 3 mL  3 mL Intravenous Q12H Segars, Jonathan, MD   3 mL at 07/15/24 9166     Discharge Medications: Allergies as of 07/15/2024       Reactions   Ibuprofen  Shortness Of Breath        Medication List     TAKE these medications    acetaminophen  325 MG tablet Commonly known as: TYLENOL  Take 2 tablets (650 mg total) by mouth every 6 (six) hours as needed for mild pain (pain score 1-3) (or Fever >/= 101).   albuterol  108 (90 Base) MCG/ACT inhaler Commonly known as: VENTOLIN  HFA Inhale 2 puffs into the lungs every 6 (six) hours as needed for shortness of breath or wheezing (Cough).   albuterol  (2.5 MG/3ML) 0.083% nebulizer solution Commonly known as: PROVENTIL  Inhale 3 mLs (2.5 mg total) into the lungs every 6 (six) hours as needed for wheezing or shortness of breath.   Breztri  Aerosphere 160-9-4.8 MCG/ACT Aero inhaler Generic drug: budesonide -glycopyrrolate -formoterol  Inhale 2 puffs into the lungs 2 (two) times daily.   clonazePAM  1 MG tablet Commonly known as: KLONOPIN  Take 1 tablet (1 mg total) by mouth 2 (two) times daily as needed for anxiety.   dextromethorphan -guaiFENesin  30-600 MG 12hr  tablet Commonly known as: MUCINEX  DM Take 1 tablet by mouth 2 (two) times daily for 10 days.   gabapentin  100 MG capsule Commonly known as: NEURONTIN  Take 1 capsule (100 mg total) by mouth 2 (two) times daily.   hydrochlorothiazide  25 MG tablet Commonly known as: HYDRODIURIL  Take 25 mg by mouth every morning.   levofloxacin  500 MG tablet Commonly known as: LEVAQUIN  Take 1 tablet (500 mg total) by mouth daily for 3 days. Start taking on: July 16, 2024   omeprazole  40 MG capsule Commonly known as: PRILOSEC Take 1 capsule (40 mg total) by mouth in the morning and at bedtime.   OXYGEN  Inhale 4 L into the lungs continuous.   predniSONE  20 MG tablet Commonly known as:  DELTASONE  Take 3 tablets by mouth daily x 1 day; then 2 tablet by mouth daily x 2 days; then 1 tablet by mouth daily x 3 days; then half tablet by mouth daily x 3 days and stop prednisone .         Relevant Imaging Results:  Relevant Lab Results:   Additional Information SSN: [id number], LCSWA

## 2024-07-15 NOTE — Discharge Summary (Signed)
 Physician Discharge Summary   Patient: Miguel Marsh MRN: [id number] DOB: [birth year]  Admit date:     07/11/2024  Discharge date: 07/15/24  Discharge Physician: Eric Nunnery   PCP: Renato Dorothey HERO, NP   Recommendations at discharge:  Repeat basic metabolic panel to follow ultralights renal function Reassess blood pressure and adjust medication as needed. Continue assisting patient with weight loss management. Follow response to lower back pain and lower extremity numbness/tingling with initiated medication and further adjust treatment as required.  Discharge Diagnoses: Principal Problem:   COPD exacerbation (HCC) Active Problems:   Encounter for smoking cessation counseling Hypertension Class II obesity Mood disorder Chronic pain syndrome Chronic diastolic heart failure Stage IV none small cell lung cancer GERD Degenerative changes lumbar disease  Brief hospital admission narrative: As per H&P written by Dr. Keturah on 07/11/2024 Miguel Marsh is a 63 y.o. 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    Reports SOB for few days, but suddenly worsened today and was increasingly SOB at rest. Typically he can go about 30 ft+ without stopping, had dropped to 37ft and then symptomatic at rest today. He has thick secretions that he has difficulty clearing. Otherwise notes intermittent sensation of chest tightness which is nonexertional. No other URI symptoms. No Fever or chills. No sick contacts. No allergy exposures. He has inhalers at home which he is using. He is still smoking about 2 cigarettes per day. No other drug use.    Otherwise notes has had about 1  month of bilateral thigh paresthesias/dysesthesia, burning. Does not radiate down his leg, feels more weak in his LE over past 2 weeks. No incontinence or saddle anesthesia   Assessment and Plan: 1-acute on chronic respiratory failure with hypoxia - Patient reports using 2-3 L nasal cannula supplementation at baseline. - Appears to be secondary to bronchiectasis along with COPD exacerbation - Patient has responded well to acute treatment and will be discharged home on steroid tapering, resumption of bronchodilator management, mucolytic's and 3 more days of antibiotics. -Oxygen  supplementation down to his baseline at discharge. -will use Ohtuvayre  at discharge for better control of symptoms and hopefully decrease changes for readmission.   2-tobacco abuse - Smoking cessation and complete abstinence recommended - Continue nicotine  gum   3-GERD/GI prophylaxis - Continue PPI -Patient instructed to take medication twice a day for better symptom control and GI protection.   4-stage IV no M small cell lung cancer - Continue outpatient follow-up with [facility] oncology service   5-chronic diastolic heart failure - Stable and compensated - Continue to to follow low-sodium diet, adequate hydration and daily weights. - Continue current diuretic therapy.   6-chronic pain syndrome - Continue home analgesic therapy.   7-mood disorder - Continue clonazepam    8-hypertension - Follow vital signs and continue home antihypertensive agent - Heart healthy/low-sodium diet discussed with patient.   9-class II obesity -Body mass index is 36.44 kg/m.  -Low-calorie diet, portion control and increase physical activity discussed with patient.   10-lower extremity weakness/tingling - MRI demonstrating degenerative changes and narrowing around L3-L4 with concern for possible nerve irritation in that area - Neurontin  twice daily will be started - Continue as needed  analgesics and muscle  relaxant.  Consultants: None Procedures performed: See below for x-ray reports. Disposition: Home Diet recommendation: Heart healthy/low-sodium and low calorie diet recommended.  DISCHARGE MEDICATION: Allergies as of 07/15/2024       Reactions   Ibuprofen  Shortness Of Breath        Medication List     TAKE these medications    acetaminophen  325 MG tablet Commonly known as: TYLENOL  Take 2 tablets (650 mg total) by mouth every 6 (six) hours as needed for mild pain (pain score 1-3) (or Fever >/= 101).   albuterol  108 (90 Base) MCG/ACT inhaler Commonly known as: VENTOLIN  HFA Inhale 2 puffs into the lungs every 6 (six) hours as needed for shortness of breath or wheezing (Cough).   albuterol  (2.5 MG/3ML) 0.083% nebulizer solution Commonly known as: PROVENTIL  Inhale 3 mLs (2.5 mg total) into the lungs every 6 (six) hours as needed for wheezing or shortness of breath.   Breztri  Aerosphere 160-9-4.8 MCG/ACT Aero inhaler Generic drug: budesonide -glycopyrrolate -formoterol  Inhale 2 puffs into the lungs 2 (two) times daily.   clonazePAM  1 MG tablet Commonly known as: KLONOPIN  Take 1 tablet (1 mg total) by mouth 2 (two) times daily as needed for anxiety.   dextromethorphan -guaiFENesin  30-600 MG 12hr tablet Commonly known as: MUCINEX  DM Take 1 tablet by mouth 2 (two) times daily for 10 days.   gabapentin  100 MG capsule Commonly known as: NEURONTIN  Take 1 capsule (100 mg total) by mouth 2 (two) times daily.   hydrochlorothiazide  25 MG tablet Commonly known as: HYDRODIURIL  Take 25 mg by mouth every morning.   levofloxacin  500 MG tablet Commonly known as: LEVAQUIN  Take 1 tablet (500 mg total) by mouth daily for 3 days. Start taking on: July 16, 2024   omeprazole  40 MG capsule Commonly known as: PRILOSEC Take 1 capsule (40 mg total) by mouth in the morning and at bedtime.   OXYGEN  Inhale 4 L into the lungs continuous.   predniSONE  20 MG tablet Commonly known as:  DELTASONE  Take 3 tablets by mouth daily x 1 day; then 2 tablet by mouth daily x 2 days; then 1 tablet by mouth daily x 3 days; then half tablet by mouth daily x 3 days and stop prednisone .        Follow-up Information     Renato Dorothey HERO, NP. Schedule an appointment as soon as possible for a visit in 10 day(s).   Specialty: Internal Medicine Contact information: [id number] [place] [address] [phone]                Discharge Exam: Fredricka Weights   07/11/24 1755 07/12/24 0806  Weight: 98 kg 102.4 kg   General exam: Alert, awake, oriented x 3; feeling better and speaking in full sentences.  Still expressing mild short winded sensation with activity but significantly improved in comparison to admission symptoms. Respiratory system: Improved air movement bilaterally; no using accessory muscle.  Positive scattered rhonchi no significant wheezing on exam. Cardiovascular system:RRR. No rubs or gallops; no JVD. Gastrointestinal system: Abdomen is obese, nondistended, soft and nontender. No organomegaly or masses felt. Normal bowel sounds heard. Central nervous system: Alert and oriented. No focal neurological deficits. Extremities: No cyanosis or clubbing; trace edema appreciated bilaterally. Skin: No petechiae. Psychiatry: Judgement and insight appear normal. Mood & affect appropriate.    Condition at discharge: Stable and improved.  The results of significant diagnostics from this hospitalization (including imaging, microbiology, ancillary and laboratory) are listed below for  reference.   Imaging Studies: MR Lumbar Spine W Wo Contrast Result Date: 07/12/2024 CLINICAL DATA:  63 year old male with history of lung cancer. Lower extremity weakness and paresthesia. EXAM: MRI LUMBAR SPINE WITHOUT AND WITH CONTRAST TECHNIQUE: Multiplanar and multiecho pulse sequences of the lumbar spine were obtained without and with intravenous contrast. CONTRAST:  10mL GADAVIST   GADOBUTROL  1 MMOL/ML IV SOLN COMPARISON:  CT Abdomen and Pelvis 01/25/2018. FINDINGS: Segmentation:  Normal on the prior CT. Alignment: Some straightening of cervical lordosis appears stable since 2019. No significant scoliosis or spondylolisthesis. Vertebrae: Normal background bone marrow signal. Maintained vertebral height. Intact visible sacrum. No vertebral lesion. No abnormal enhancement identified. No marrow edema or evidence of acute osseous abnormality. Conus medullaris and cauda equina: Conus extends to the T12-L1 level. No lower spinal cord or conus signal abnormality. No suspicious intradural enhancement. No abnormal dural thickening or enhancement identified. Cauda equina nerve roots appear normal. Capacious spinal canal. Paraspinal and other soft tissues: Partially visible chronic and benign left renal cysts (no follow-up imaging recommended). Negative other Visualized abdominal viscera and paraspinal soft tissues are within normal limits. Disc levels: T11-T12 through L1-L2:  Negative. L2-L3:  Mild mostly anterior and lateral disc bulging.  No stenosis. L3-L4: Disc space loss. Mild circumferential disc bulge. Mild posterior element hypertrophy. Endplate spurring. Moderate left (series 5, image 12) and mild right L3 neural foraminal stenosis. No spinal or lateral recess stenosis. L4-L5: Better preserved disc height but similar circumferential disc bulge with endplate spurring. Marked mild to moderate facet and ligament flavum hypertrophy. No spinal or lateral recess stenosis. Bilateral L4 neural foraminal stenosis, moderate on the left and moderate to severe on the right (series 8, image 25). L5-S1: Negative disc. Mild endplate spurring. Mild facet hypertrophy. Mild left L5 neural foraminal stenosis.6 IMPRESSION: 1. No metastatic disease identified. 2. Lumbar disc and endplate degeneration, mild posterior element hypertrophy without spinal or lateral recess stenosis. But moderate Left L3, L4, and  moderate to severe Right L4 nerve level foraminal stenosis. Query associated radiculitis. Electronically Signed   By: VEAR Hurst M.D.   On: 07/12/2024 09:46   DG Chest Port 1 View Result Date: 07/11/2024 CLINICAL DATA:  sob pt coming from home arrives on C-pap. C/o respiratory distress over past 3-4 days. Takes lasix  and hydrochlorothiazide  daily with no missing doses. Ems reports diminished upper lung sounds and absent lower lung sounds EXAM: PORTABLE CHEST 1 VIEW COMPARISON:  Chest x-ray 06/12/2024, CT chest 12/04/2023 FINDINGS: The heart and mediastinal contours are unchanged. Redemonstration of left mid lung zone linear atelectasis versus scarring. No focal consolidation. No pulmonary edema. No pleural effusion. No pneumothorax. No acute osseous abnormality. IMPRESSION: No active disease. Electronically Signed   By: Morgane  Naveau M.D.   On: 07/11/2024 19:20    Microbiology: Results for orders placed or performed during the hospital encounter of 07/11/24  Resp panel by RT-PCR (RSV, Flu A&B, Covid) Anterior Nasal Swab     Status: None   Collection Time: 07/11/24  5:54 PM   Specimen: Anterior Nasal Swab  Result Value Ref Range Status   SARS Coronavirus 2 by RT PCR NEGATIVE NEGATIVE Final    Comment: (NOTE) SARS-CoV-2 target nucleic acids are NOT DETECTED.  The SARS-CoV-2 RNA is generally detectable in upper respiratory specimens during the acute phase of infection. The lowest concentration of SARS-CoV-2 viral copies this assay can detect is 138 copies/mL. A negative result does not preclude SARS-Cov-2 infection and should not be used as the sole basis for treatment  or other patient management decisions. A negative result may occur with  improper specimen collection/handling, submission of specimen other than nasopharyngeal swab, presence of viral mutation(s) within the areas targeted by this assay, and inadequate number of viral copies(<138 copies/mL). A negative result must be combined  with clinical observations, patient history, and epidemiological information. The expected result is Negative.  Fact Sheet for Patients:  BloggerCourse.com  Fact Sheet for Healthcare Providers:  SeriousBroker.it  This test is no t yet approved or cleared by the [place]  FDA and  has been authorized for detection and/or diagnosis of SARS-CoV-2 by FDA under an Emergency Use Authorization (EUA). This EUA will remain  in effect (meaning this test can be used) for the duration of the COVID-19 declaration under Section 564(b)(1) of the Act, 21 U.S.C.section 360bbb-3(b)(1), unless the authorization is terminated  or revoked sooner.       Influenza A by PCR NEGATIVE NEGATIVE Final   Influenza B by PCR NEGATIVE NEGATIVE Final    Comment: (NOTE) The Xpert Xpress SARS-CoV-2/FLU/RSV plus assay is intended as an aid in the diagnosis of influenza from Nasopharyngeal swab specimens and should not be used as a sole basis for treatment. Nasal washings and aspirates are unacceptable for Xpert Xpress SARS-CoV-2/FLU/RSV testing.  Fact Sheet for Patients: BloggerCourse.com  Fact Sheet for Healthcare Providers: SeriousBroker.it  This test is not yet approved or cleared by the [place]  FDA and has been authorized for detection and/or diagnosis of SARS-CoV-2 by FDA under an Emergency Use Authorization (EUA). This EUA will remain in effect (meaning this test can be used) for the duration of the COVID-19 declaration under Section 564(b)(1) of the Act, 21 U.S.C. section 360bbb-3(b)(1), unless the authorization is terminated or revoked.     Resp Syncytial Virus by PCR NEGATIVE NEGATIVE Final    Comment: (NOTE) Fact Sheet for Patients: BloggerCourse.com  Fact Sheet for Healthcare Providers: SeriousBroker.it  This test is not yet approved  or cleared by the [place]  FDA and has been authorized for detection and/or diagnosis of SARS-CoV-2 by FDA under an Emergency Use Authorization (EUA). This EUA will remain in effect (meaning this test can be used) for the duration of the COVID-19 declaration under Section 564(b)(1) of the Act, 21 U.S.C. section 360bbb-3(b)(1), unless the authorization is terminated or revoked.  Performed at [facility], [address], [place], [place] [address]   Expectorated Sputum Assessment w Gram Stain, Rflx to Resp Cult     Status: None   Collection Time: 07/11/24  9:38 PM   Specimen: Expectorated Sputum  Result Value Ref Range Status   Specimen Description EXPECTORATED SPUTUM  Final   Special Requests NONE  Final   Sputum evaluation   Final    Sputum specimen not acceptable for testing.  Please recollect.   CALLED AND SPOKE WITH J. KING ON 07/13/2024 @2 :55PM BY IVAR ECHEVARIA  Performed at [facility], [address] [place], [place] [address]    Report Status 07/13/2024 FINAL  Final  Respiratory (~20 pathogens) panel by PCR     Status: None   Collection Time: 07/12/24  6:21 PM   Specimen: Nasopharyngeal Swab; Respiratory  Result Value Ref Range Status   Adenovirus NOT DETECTED NOT DETECTED Final   Coronavirus 229E NOT DETECTED NOT DETECTED Final    Comment: (NOTE) The Coronavirus on the Respiratory Panel, DOES NOT test for the novel  Coronavirus (2019 nCoV)    Coronavirus HKU1 NOT DETECTED NOT DETECTED Final   Coronavirus NL63 NOT DETECTED  NOT DETECTED Final   Coronavirus OC43 NOT DETECTED NOT DETECTED Final   Metapneumovirus NOT DETECTED NOT DETECTED Final   Rhinovirus / Enterovirus NOT DETECTED NOT DETECTED Final   Influenza A NOT DETECTED NOT DETECTED Final   Influenza B NOT DETECTED NOT DETECTED Final   Parainfluenza Virus 1 NOT DETECTED NOT DETECTED Final   Parainfluenza Virus 2 NOT DETECTED NOT DETECTED Final   Parainfluenza Virus 3 NOT DETECTED NOT DETECTED Final   Parainfluenza  Virus 4 NOT DETECTED NOT DETECTED Final   Respiratory Syncytial Virus NOT DETECTED NOT DETECTED Final   Bordetella pertussis NOT DETECTED NOT DETECTED Final   Bordetella Parapertussis NOT DETECTED NOT DETECTED Final   Chlamydophila pneumoniae NOT DETECTED NOT DETECTED Final   Mycoplasma pneumoniae NOT DETECTED NOT DETECTED Final    Comment: Performed at [facility], [address] [place], [place] [address]    Labs: CBC: Recent Labs  Lab 07/11/24 1806 07/11/24 1807 07/12/24 0544  WBC 6.4  --  5.4  NEUTROABS 3.7  --   --   HGB 12.7* 13.6 12.2*  HCT 40.5 40.0 38.7*  MCV 94.2  --  92.6  PLT 187  --  171   Basic Metabolic Panel: Recent Labs  Lab 07/11/24 1806 07/11/24 1807 07/12/24 0544  NA 140 139 139  K 4.0 3.9 4.7  CL 92* 95* 94*  CO2 36*  --  36*  GLUCOSE 109* 102* 148*  BUN 11 11 13   CREATININE 0.82 0.80 0.71  CALCIUM 9.3  --  9.1  MG  --   --  2.0  PHOS  --   --  3.4   Liver Function Tests: Recent Labs  Lab 07/11/24 1806  AST 22  ALT 13  ALKPHOS 68  BILITOT 0.6  PROT 7.2  ALBUMIN 3.7   CBG: No results for input(s): GLUCAP in the last 168 hours.  Discharge time spent:  35 minutes.  Signed: Eric Nunnery, MD [facility] 07/15/2024

## 2024-08-03 ENCOUNTER — Emergency Department (HOSPITAL_COMMUNITY)

## 2024-08-03 ENCOUNTER — Other Ambulatory Visit: Payer: Self-pay

## 2024-08-03 ENCOUNTER — Emergency Department (HOSPITAL_COMMUNITY)
Admission: EM | Admit: 2024-08-03 | Discharge: 2024-08-03 | Disposition: A | Discharge status: Nursing Home (Includes ICF)

## 2024-08-03 DIAGNOSIS — J441 Chronic obstructive pulmonary disease with (acute) exacerbation: Secondary | ICD-10-CM | POA: Insufficient documentation

## 2024-08-03 DIAGNOSIS — Z79899 Other long term (current) drug therapy: Secondary | ICD-10-CM | POA: Diagnosis not present

## 2024-08-03 DIAGNOSIS — Z7951 Long term (current) use of inhaled steroids: Secondary | ICD-10-CM | POA: Insufficient documentation

## 2024-08-03 DIAGNOSIS — R600 Localized edema: Secondary | ICD-10-CM | POA: Diagnosis not present

## 2024-08-03 DIAGNOSIS — I10 Essential (primary) hypertension: Secondary | ICD-10-CM | POA: Insufficient documentation

## 2024-08-03 DIAGNOSIS — Z7952 Long term (current) use of systemic steroids: Secondary | ICD-10-CM | POA: Diagnosis not present

## 2024-08-03 DIAGNOSIS — R059 Cough, unspecified: Secondary | ICD-10-CM | POA: Diagnosis present

## 2024-08-03 LAB — BASIC METABOLIC PANEL WITH GFR
Anion gap: 8 (ref 5–15)
BUN: 10 mg/dL (ref 8–23)
CO2: 36 mmol/L — ABNORMAL HIGH (ref 22–32)
Calcium: 9.2 mg/dL (ref 8.9–10.3)
Chloride: 96 mmol/L — ABNORMAL LOW (ref 98–111)
Creatinine, Ser: 0.94 mg/dL (ref 0.61–1.24)
GFR, Estimated: >60 mL/min (ref >60)
Glucose, Bld: 96 mg/dL (ref 70–99)
Potassium: 4.5 mmol/L (ref 3.5–5.1)
Sodium: 140 mmol/L (ref 135–145)

## 2024-08-03 LAB — HEPATIC FUNCTION PANEL
ALT: 35 U/L (ref 0–44)
AST: 31 U/L (ref 15–41)
Albumin: 4.1 g/dL (ref 3.5–5.0)
Alkaline Phosphatase: 92 U/L (ref 38–126)
Bilirubin, Direct: 0.2 mg/dL (ref 0.0–0.2)
Indirect Bilirubin: 0.3 mg/dL (ref 0.3–0.9)
Total Bilirubin: 0.5 mg/dL (ref 0.0–1.2)
Total Protein: 6.9 g/dL (ref 6.5–8.1)

## 2024-08-03 LAB — URINALYSIS, ROUTINE W REFLEX MICROSCOPIC
Bilirubin Urine: NEGATIVE
Glucose, UA: NEGATIVE mg/dL
Hgb urine dipstick: NEGATIVE
Ketones, ur: NEGATIVE mg/dL
Leukocytes,Ua: NEGATIVE
Nitrite: NEGATIVE
Protein, ur: NEGATIVE mg/dL
Specific Gravity, Urine: 1.018 (ref 1.005–1.030)
pH: 6 (ref 5.0–8.0)

## 2024-08-03 LAB — RESP PANEL BY RT-PCR (RSV, FLU A&B, COVID)  RVPGX2
Influenza A by PCR: NEGATIVE
Influenza B by PCR: NEGATIVE
Resp Syncytial Virus by PCR: NEGATIVE
SARS Coronavirus 2 by RT PCR: NEGATIVE

## 2024-08-03 LAB — PRO BRAIN NATRIURETIC PEPTIDE: Pro Brain Natriuretic Peptide: <50 pg/mL (ref <300.0)

## 2024-08-03 LAB — CBC
HCT: 40.8 % (ref 39.0–52.0)
Hemoglobin: 12.3 g/dL — ABNORMAL LOW (ref 13.0–17.0)
MCH: 29.1 pg (ref 26.0–34.0)
MCHC: 30.1 g/dL (ref 30.0–36.0)
MCV: 96.7 fL (ref 80.0–100.0)
Platelets: 136 K/uL — ABNORMAL LOW (ref 150–400)
RBC: 4.22 MIL/uL (ref 4.22–5.81)
RDW: 12.4 % (ref 11.5–15.5)
WBC: 6.5 K/uL (ref 4.0–10.5)
nRBC: 0 % (ref 0.0–0.2)

## 2024-08-03 LAB — TROPONIN T, HIGH SENSITIVITY
Troponin T High Sensitivity: 27 ng/L — ABNORMAL HIGH (ref 0–19)
Troponin T High Sensitivity: 23 ng/L — ABNORMAL HIGH (ref 0–19)

## 2024-08-03 MED ORDER — IPRATROPIUM-ALBUTEROL 0.5-2.5 (3) MG/3ML IN SOLN
3.0000 mL | Freq: Once | RESPIRATORY_TRACT | Status: AC
  Administered 2024-08-03: 3 mL via RESPIRATORY_TRACT
  Filled 2024-08-03: qty 3

## 2024-08-03 MED ORDER — PREDNISONE 10 MG PO TABS: 40.0000 mg | ORAL_TABLET | Freq: Every day | ORAL | 0 refills | Status: AC

## 2024-08-03 MED ORDER — SODIUM CHLORIDE 0.9 % IV BOLUS
500.0000 mL | Freq: Once | INTRAVENOUS | Status: AC
  Administered 2024-08-03: 500 mL via INTRAVENOUS

## 2024-08-03 MED ORDER — DOXYCYCLINE HYCLATE 100 MG PO CAPS: 100.0000 mg | ORAL_CAPSULE | Freq: Two times a day (BID) | ORAL | 0 refills | Status: DC

## 2024-08-03 MED ORDER — DOXYCYCLINE HYCLATE 100 MG PO TABS: 100.0000 mg | ORAL_TABLET | Freq: Once | ORAL | Status: DC

## 2024-08-03 MED ORDER — METHYLPREDNISOLONE SODIUM SUCC 125 MG IJ SOLR
80.0000 mg | Freq: Once | INTRAMUSCULAR | Status: AC
  Administered 2024-08-03: 80 mg via INTRAVENOUS
  Filled 2024-08-03: qty 2

## 2024-08-03 NOTE — ED Triage Notes (Signed)
 Pt arrived [facility] from [facility] with c/o SOB and left thigh swelling that started today.

## 2024-08-03 NOTE — ED Notes (Signed)
 Unable to contact group home x3

## 2024-08-03 NOTE — Discharge Instructions (Addendum)
 Please take all medications as directed.  Return to emergency department immediately for any new or worsening symptoms.  Follow-up closely with your primary care doctor on an outpatient basis.

## 2024-08-03 NOTE — ED Provider Notes (Signed)
 [place] EMERGENCY DEPARTMENT AT [facility] Provider Note   CSN: [id number] Arrival date & time: 08/03/24  1015     Patient presents with: Shortness of Breath   Miguel Marsh is a 63 y.o. male.   Patient is a 63 year old male with a history of COPD and hypertension who presents to the emergency department with a chief complaint of shortness of breath and swelling to his left thigh.  He notes that the swelling has been ongoing for the past few days but notes that his shortness of breath became worse today.  Patient denies any associated chest pain, abdominal pain, nausea, vomiting, diarrhea.  He has had no associated fever or chills.  He does admit to a mild increased productive cough.  He has had no dizziness, lightheadedness or syncope.   Shortness of Breath      Prior to Admission medications   Medication Sig Start Date End Date Taking? Authorizing Provider  acetaminophen  (TYLENOL ) 325 MG tablet Take 2 tablets (650 mg total) by mouth every 6 (six) hours as needed for mild pain (pain score 1-3) (or Fever >/= 101). 01/31/24   Pearlean Manus, MD  albuterol  (PROVENTIL ) (2.5 MG/3ML) 0.083% nebulizer solution Inhale 3 mLs (2.5 mg total) into the lungs every 6 (six) hours as needed for wheezing or shortness of breath. 01/31/24   Pearlean Manus, MD  albuterol  (VENTOLIN  HFA) 108 (90 Base) MCG/ACT inhaler Inhale 2 puffs into the lungs every 6 (six) hours as needed for shortness of breath or wheezing (Cough). 01/31/24   Pearlean Manus, MD  BREZTRI  AEROSPHERE 160-9-4.8 MCG/ACT AERO Inhale 2 puffs into the lungs 2 (two) times daily. 01/31/24   Pearlean Manus, MD  clonazePAM  (KLONOPIN ) 1 MG tablet Take 1 tablet (1 mg total) by mouth 2 (two) times daily as needed for anxiety. 11/01/23   Pearlean Manus, MD  gabapentin  (NEURONTIN ) 100 MG capsule Take 1 capsule (100 mg total) by mouth 2 (two) times daily. 07/15/24   Ricky Fines, MD  hydrochlorothiazide  (HYDRODIURIL ) 25 MG tablet Take  25 mg by mouth every morning.    [provider]  omeprazole  (PRILOSEC) 40 MG capsule Take 1 capsule (40 mg total) by mouth in the morning and at bedtime. 07/15/24   Ricky Fines, MD  OXYGEN  Inhale 4 L into the lungs continuous.    [provider]  predniSONE  (DELTASONE ) 20 MG tablet Take 3 tablets by mouth daily x 1 day; then 2 tablet by mouth daily x 2 days; then 1 tablet by mouth daily x 3 days; then half tablet by mouth daily x 3 days and stop prednisone . 07/15/24   Ricky Fines, MD    Allergies: Ibuprofen     Review of Systems  Respiratory:  Positive for shortness of breath.   All other systems reviewed and are negative.   Updated Vital Signs BP 97/60   Pulse 80   Temp 99.2 F (37.3 C) (Oral)   Resp (!) 29   Ht 5' 6 (1.676 m)   Wt 102 kg   SpO2 95%   BMI 36.29 kg/m   Physical Exam Vitals and nursing note reviewed.  Constitutional:      General: He is not in acute distress.    Appearance: Normal appearance. He is not ill-appearing.  HENT:     Head: Normocephalic and atraumatic.     Nose: Nose normal.     Mouth/Throat:     Mouth: Mucous membranes are moist.  Eyes:     Extraocular Movements:  Extraocular movements intact.     Conjunctiva/sclera: Conjunctivae normal.     Pupils: Pupils are equal, round, and reactive to light.  Cardiovascular:     Rate and Rhythm: Normal rate and regular rhythm.     Pulses: Normal pulses.     Heart sounds: Normal heart sounds. No murmur heard.    No gallop.  Pulmonary:     Effort: Pulmonary effort is normal. No tachypnea.     Breath sounds: Wheezing and rhonchi present. No decreased breath sounds or rales.  Chest:     Chest wall: No tenderness.  Abdominal:     General: Abdomen is flat. Bowel sounds are normal.     Palpations: Abdomen is soft.     Tenderness: There is no abdominal tenderness.  Musculoskeletal:        General: Normal range of motion.     Cervical back: Normal range of motion and neck supple.      Right lower leg: No edema.     Left lower leg: Edema present.  Skin:    General: Skin is warm and dry.  Neurological:     General: No focal deficit present.     Mental Status: He is alert and oriented to person, place, and time. Mental status is at baseline.     Cranial Nerves: No cranial nerve deficit.     Motor: No weakness.  Psychiatric:        Mood and Affect: Mood normal.        Behavior: Behavior normal.        Thought Content: Thought content normal.        Judgment: Judgment normal.     (all labs ordered are listed, but only abnormal results are displayed) Labs Reviewed  RESP PANEL BY RT-PCR (RSV, FLU A&B, COVID)  [id number]  BASIC METABOLIC PANEL WITH GFR  CBC  HEPATIC FUNCTION PANEL  PRO BRAIN NATRIURETIC PEPTIDE  URINALYSIS, ROUTINE W REFLEX MICROSCOPIC  TROPONIN T, HIGH SENSITIVITY    EKG: None  Radiology: No results found.   Procedures   Medications Ordered in the ED  ipratropium-albuterol  (DUONEB) 0.5-2.5 (3) MG/3ML nebulizer solution 3 mL (has no administration in time range)  ipratropium-albuterol  (DUONEB) 0.5-2.5 (3) MG/3ML nebulizer solution 3 mL (has no administration in time range)  methylPREDNISolone  sodium succinate (SOLU-MEDROL ) 125 mg/2 mL injection 80 mg (has no administration in time range)                                    Medical Decision Making Amount and/or Complexity of Data Reviewed Labs: ordered. Radiology: ordered.  Risk Prescription drug management.   This patient presents to the ED for concern of dyspnea, cough differential diagnosis includes COPD exacerbation, acute viral syndrome, pneumonia, CHF, ACS    Additional history obtained:  Additional history obtained from none External records from outside source obtained and reviewed including medical records   Lab Tests:  I Ordered, and personally interpreted labs.  The pertinent results include: No leukocytosis, no anemia, normal kidney function liver function,  unremarkable electrolytes, negative urinalysis, negative viral swab, stable serial troponins, negative BNP   Imaging Studies ordered:  I ordered imaging studies including venous duplex left lower extremity, chest x-ray I independently visualized and interpreted imaging which showed no evidence of DVT, left lower lobe atelectasis I agree with the radiologist interpretation   Medicines ordered and prescription drug management:  I ordered medication including  DuoNeb, Solu-Medrol , doxycycline , IV fluids for COPD exacerbation Reevaluation of the patient after these medicines showed that the patient improved I have reviewed the patients home medicines and have made adjustments as needed   Problem List / ED Course:  Patient is doing very well at this time and is stable for discharge home.  Discussed with patient we will treat him for COPD exacerbation.  Vital signs are stable at this point with no indication for sepsis.  Oxygen  saturations are stable on his home 3 L nasal cannula.  Do not suspect that admission is warranted at this time.  Blood work is otherwise been unremarkable.  He has no indication for acute CHF.  Do not suspect ACS at this time as EKG has no acute ischemic changes and he has stable serial troponins.  Patient has had no associated chest pain.  The need for close follow-up with primary care doctor was discussed as well as strict turn precautions for any new or worsening symptoms.  Patient voiced understanding and had no additional questions.  Patient was fully evaluated by attending physician who is in agreement to plan at this time.   Social Determinants of Health:  None        Final diagnoses:  None    ED Discharge Orders     None          Greogory Cornette D, PA-C 08/03/24 1442    Simon Lavonia SAILOR, MD 08/03/24 [phone]

## 2024-09-13 ENCOUNTER — Observation Stay (HOSPITAL_COMMUNITY)
Admission: EM | Admit: 2024-09-13 | Discharge: 2024-09-15 | Disposition: A | Discharge status: Home Health | Attending: Internal Medicine | Admitting: Internal Medicine

## 2024-09-13 ENCOUNTER — Emergency Department (HOSPITAL_COMMUNITY)

## 2024-09-13 ENCOUNTER — Other Ambulatory Visit: Payer: Self-pay

## 2024-09-13 ENCOUNTER — Encounter (HOSPITAL_COMMUNITY): Payer: Self-pay | Admitting: Emergency Medicine

## 2024-09-13 DIAGNOSIS — J441 Chronic obstructive pulmonary disease with (acute) exacerbation: Principal | ICD-10-CM | POA: Diagnosis present

## 2024-09-13 DIAGNOSIS — J9611 Chronic respiratory failure with hypoxia: Secondary | ICD-10-CM

## 2024-09-13 DIAGNOSIS — I5032 Chronic diastolic (congestive) heart failure: Secondary | ICD-10-CM | POA: Insufficient documentation

## 2024-09-13 DIAGNOSIS — K219 Gastro-esophageal reflux disease without esophagitis: Secondary | ICD-10-CM | POA: Insufficient documentation

## 2024-09-13 DIAGNOSIS — Z8505 Personal history of malignant neoplasm of liver: Secondary | ICD-10-CM | POA: Insufficient documentation

## 2024-09-13 DIAGNOSIS — F17210 Nicotine dependence, cigarettes, uncomplicated: Secondary | ICD-10-CM | POA: Diagnosis not present

## 2024-09-13 DIAGNOSIS — M6281 Muscle weakness (generalized): Secondary | ICD-10-CM | POA: Insufficient documentation

## 2024-09-13 DIAGNOSIS — J189 Pneumonia, unspecified organism: Secondary | ICD-10-CM | POA: Diagnosis not present

## 2024-09-13 DIAGNOSIS — I110 Hypertensive heart disease with heart failure: Secondary | ICD-10-CM | POA: Diagnosis not present

## 2024-09-13 DIAGNOSIS — R7989 Other specified abnormal findings of blood chemistry: Secondary | ICD-10-CM | POA: Insufficient documentation

## 2024-09-13 DIAGNOSIS — J129 Viral pneumonia, unspecified: Secondary | ICD-10-CM | POA: Insufficient documentation

## 2024-09-13 DIAGNOSIS — F39 Unspecified mood [affective] disorder: Secondary | ICD-10-CM | POA: Insufficient documentation

## 2024-09-13 DIAGNOSIS — I10 Essential (primary) hypertension: Secondary | ICD-10-CM

## 2024-09-13 DIAGNOSIS — Z96641 Presence of right artificial hip joint: Secondary | ICD-10-CM | POA: Diagnosis not present

## 2024-09-13 DIAGNOSIS — J449 Chronic obstructive pulmonary disease, unspecified: Secondary | ICD-10-CM | POA: Diagnosis present

## 2024-09-13 DIAGNOSIS — R0602 Shortness of breath: Secondary | ICD-10-CM | POA: Diagnosis present

## 2024-09-13 LAB — BASIC METABOLIC PANEL WITH GFR
Anion gap: 4 — ABNORMAL LOW (ref 5–15)
BUN: 15 mg/dL (ref 8–23)
CO2: 39 mmol/L — ABNORMAL HIGH (ref 22–32)
Calcium: 8.7 mg/dL — ABNORMAL LOW (ref 8.9–10.3)
Chloride: 101 mmol/L (ref 98–111)
Creatinine, Ser: 0.93 mg/dL (ref 0.61–1.24)
GFR, Estimated: >60 mL/min (ref >60)
Glucose, Bld: 112 mg/dL — ABNORMAL HIGH (ref 70–99)
Potassium: 4.1 mmol/L (ref 3.5–5.1)
Sodium: 144 mmol/L (ref 135–145)

## 2024-09-13 LAB — TROPONIN T, HIGH SENSITIVITY
Troponin T High Sensitivity: 21 ng/L — ABNORMAL HIGH (ref 0–19)
Troponin T High Sensitivity: 21 ng/L — ABNORMAL HIGH (ref 0–19)

## 2024-09-13 LAB — CBC
HCT: 38.8 % — ABNORMAL LOW (ref 39.0–52.0)
Hemoglobin: 11.7 g/dL — ABNORMAL LOW (ref 13.0–17.0)
MCH: 29.2 pg (ref 26.0–34.0)
MCHC: 30.2 g/dL (ref 30.0–36.0)
MCV: 96.8 fL (ref 80.0–100.0)
Platelets: 214 K/uL (ref 150–400)
RBC: 4.01 MIL/uL — ABNORMAL LOW (ref 4.22–5.81)
RDW: 12.8 % (ref 11.5–15.5)
WBC: 7.2 K/uL (ref 4.0–10.5)
nRBC: 0 % (ref 0.0–0.2)

## 2024-09-13 LAB — PHOSPHORUS: Phosphorus: 3.1 mg/dL (ref 2.5–4.6)

## 2024-09-13 LAB — PROCALCITONIN: Procalcitonin: <0.1 ng/mL

## 2024-09-13 LAB — MAGNESIUM: Magnesium: 1.9 mg/dL (ref 1.7–2.4)

## 2024-09-13 LAB — PRO BRAIN NATRIURETIC PEPTIDE: Pro Brain Natriuretic Peptide: 69.3 pg/mL (ref <300.0)

## 2024-09-13 MED ORDER — IPRATROPIUM-ALBUTEROL 0.5-2.5 (3) MG/3ML IN SOLN
3.0000 mL | Freq: Once | RESPIRATORY_TRACT | Status: AC
  Administered 2024-09-13: 3 mL via RESPIRATORY_TRACT
  Filled 2024-09-13: qty 3

## 2024-09-13 MED ORDER — HYDROCHLOROTHIAZIDE 25 MG PO TABS
25.0000 mg | ORAL_TABLET | Freq: Every morning | ORAL | Status: DC
  Administered 2024-09-13 – 2024-09-15 (×3): 25 mg via ORAL
  Filled 2024-09-13 (×3): qty 1

## 2024-09-13 MED ORDER — ACETAMINOPHEN 650 MG RE SUPP
650.0000 mg | Freq: Four times a day (QID) | RECTAL | Status: DC | PRN
Start: 2024-09-13 — End: 2024-09-15

## 2024-09-13 MED ORDER — PANTOPRAZOLE SODIUM 40 MG PO TBEC
40.0000 mg | DELAYED_RELEASE_TABLET | Freq: Every day | ORAL | Status: DC
  Administered 2024-09-13 – 2024-09-15 (×3): 40 mg via ORAL
  Filled 2024-09-13 (×3): qty 1

## 2024-09-13 MED ORDER — ENOXAPARIN SODIUM 60 MG/0.6ML IJ SOSY
60.0000 mg | PREFILLED_SYRINGE | INTRAMUSCULAR | Status: DC
  Administered 2024-09-13 – 2024-09-14 (×2): 60 mg via SUBCUTANEOUS
  Filled 2024-09-13 (×2): qty 0.6

## 2024-09-13 MED ORDER — IPRATROPIUM-ALBUTEROL 0.5-2.5 (3) MG/3ML IN SOLN
3.0000 mL | RESPIRATORY_TRACT | Status: DC | PRN
  Administered 2024-09-13 (×2): 3 mL via RESPIRATORY_TRACT
  Filled 2024-09-13 (×2): qty 3

## 2024-09-13 MED ORDER — SODIUM CHLORIDE 0.9 % IV SOLN
2.0000 g | Freq: Once | INTRAVENOUS | Status: AC
  Administered 2024-09-13: 2 g via INTRAVENOUS
  Filled 2024-09-13: qty 20

## 2024-09-13 MED ORDER — IOHEXOL 350 MG/ML SOLN
75.0000 mL | Freq: Once | INTRAVENOUS | Status: AC | PRN
  Administered 2024-09-13: 75 mL via INTRAVENOUS

## 2024-09-13 MED ORDER — ALBUTEROL SULFATE (2.5 MG/3ML) 0.083% IN NEBU
2.5000 mg | INHALATION_SOLUTION | RESPIRATORY_TRACT | Status: DC | PRN
  Administered 2024-09-13 – 2024-09-14 (×2): 2.5 mg via RESPIRATORY_TRACT
  Filled 2024-09-13 (×2): qty 3

## 2024-09-13 MED ORDER — DM-GUAIFENESIN ER 30-600 MG PO TB12
1.0000 | ORAL_TABLET | Freq: Two times a day (BID) | ORAL | Status: DC
  Administered 2024-09-13 – 2024-09-15 (×5): 1 via ORAL
  Filled 2024-09-13 (×5): qty 1

## 2024-09-13 MED ORDER — METHYLPREDNISOLONE SODIUM SUCC 40 MG IJ SOLR
40.0000 mg | Freq: Two times a day (BID) | INTRAMUSCULAR | Status: DC
  Administered 2024-09-13 – 2024-09-14 (×3): 40 mg via INTRAVENOUS
  Filled 2024-09-13 (×3): qty 1

## 2024-09-13 MED ORDER — ACETAMINOPHEN 325 MG PO TABS
650.0000 mg | ORAL_TABLET | Freq: Four times a day (QID) | ORAL | Status: DC | PRN
  Administered 2024-09-13 (×2): 650 mg via ORAL
  Filled 2024-09-13 (×2): qty 2

## 2024-09-13 MED ORDER — SODIUM CHLORIDE 0.9 % IV SOLN
2.0000 g | INTRAVENOUS | Status: DC
  Administered 2024-09-14 – 2024-09-15 (×2): 2 g via INTRAVENOUS
  Filled 2024-09-13 (×3): qty 20

## 2024-09-13 MED ORDER — ONDANSETRON HCL 4 MG/2ML IJ SOLN: 4.0000 mg | Freq: Four times a day (QID) | INTRAMUSCULAR | Status: DC | PRN

## 2024-09-13 MED ORDER — ENOXAPARIN SODIUM 60 MG/0.6ML IJ SOSY
50.0000 mg | PREFILLED_SYRINGE | INTRAMUSCULAR | Status: DC
  Filled 2024-09-13: qty 0.6

## 2024-09-13 MED ORDER — SODIUM CHLORIDE 0.9 % IV SOLN
500.0000 mg | INTRAVENOUS | Status: DC
  Administered 2024-09-13 – 2024-09-15 (×3): 500 mg via INTRAVENOUS
  Filled 2024-09-13 (×3): qty 5

## 2024-09-13 MED ORDER — ONDANSETRON HCL 4 MG PO TABS: 4.0000 mg | ORAL_TABLET | Freq: Four times a day (QID) | ORAL | Status: DC | PRN

## 2024-09-13 MED ORDER — CLONAZEPAM 0.5 MG PO TABS
1.0000 mg | ORAL_TABLET | Freq: Two times a day (BID) | ORAL | Status: DC | PRN
  Administered 2024-09-13 – 2024-09-14 (×2): 1 mg via ORAL
  Filled 2024-09-13 (×2): qty 2

## 2024-09-13 MED ORDER — METHYLPREDNISOLONE SODIUM SUCC 125 MG IJ SOLR
125.0000 mg | Freq: Once | INTRAMUSCULAR | Status: AC
  Administered 2024-09-13: 125 mg via INTRAVENOUS
  Filled 2024-09-13: qty 2

## 2024-09-13 MED ORDER — IPRATROPIUM-ALBUTEROL 0.5-2.5 (3) MG/3ML IN SOLN
3.0000 mL | Freq: Three times a day (TID) | RESPIRATORY_TRACT | Status: DC
  Administered 2024-09-13 – 2024-09-15 (×5): 3 mL via RESPIRATORY_TRACT
  Filled 2024-09-13 (×5): qty 3

## 2024-09-13 MED ORDER — BUDESON-GLYCOPYRROL-FORMOTEROL 160-9-4.8 MCG/ACT IN AERO
2.0000 | INHALATION_SPRAY | Freq: Two times a day (BID) | RESPIRATORY_TRACT | Status: DC
  Administered 2024-09-13 – 2024-09-15 (×4): 2 via RESPIRATORY_TRACT
  Filled 2024-09-13: qty 5.9

## 2024-09-13 NOTE — ED Triage Notes (Signed)
 Pt BIB [facility] from group home [facility]). Pt c/o shortness of breath for the past week. States he also has had some swelling to his lower legs.   Pt wears 4L [place] chronically at facility. Pt was taking albuterol  treatment when EMS arrived.

## 2024-09-13 NOTE — TOC Initial Note (Signed)
 Transition of Care [facility]) - Initial/Assessment Note    Patient Details  Name: Miguel Marsh MRN: [id number] Date of Birth: April 24, 1961  Transition of Care [name] CM/SW Contact:    Noreen KATHEE Cleotilde ISRAEL Phone Number: 09/13/2024, 12:53 PM  Clinical Narrative:                  CSW spoke with patient at bedside and assessed him. Patient lives at [facility]. Patient reports that he has been there for awhile and is fairly independent. He reports that they will assist if he needs something. Patient reports that he has oxygen , a cane, and WC at [facility] Patient transportation services is through [facility]. ICM will continue to follow.    Expected Discharge Plan: Group Home Barriers to Discharge: Continued Medical Work up   Patient Goals and CMS Choice Patient states their goals for this hospitalization and ongoing recovery are:: return back to [facility] Medicare.gov Compare Post Acute Care list provided to:: Patient Choice offered to / list presented to : Patient      Expected Discharge Plan and Services     Post Acute Care Choice: Durable Medical Equipment Living arrangements for the past 2 months: Group Home                                      Prior Living Arrangements/Services Living arrangements for the past 2 months: Group Home Lives with:: Roommate Patient language and need for interpreter reviewed:: Yes Do you feel safe going back to the place where you live?: Yes      Need for Family Participation in Patient Care: Yes (Comment) Care giver support system in place?: No (comment) Current home services: DME Criminal Activity/Legal Involvement Pertinent to Current Situation/Hospitalization: No - Comment as needed  Activities of Daily Living   ADL Screening (condition at time of admission) Independently performs ADLs?: Yes (appropriate for developmental age) Is the patient deaf or have difficulty hearing?: No Does the patient have difficulty seeing, even  when wearing glasses/contacts?: No Does the patient have difficulty concentrating, remembering, or making decisions?: No  Permission Sought/Granted      Share Information with NAME: Cornellius     Permission granted to share info w Relationship: Patient     Emotional Assessment Appearance:: Appears stated age Attitude/Demeanor/Rapport: Engaged Affect (typically observed): Accepting, Appropriate Orientation: : Oriented to Self, Oriented to Place, Oriented to  Time, Oriented to Situation Alcohol  / Substance Use: Tobacco Use Psych Involvement: No (comment)  Admission diagnosis:  Acute exacerbation of chronic obstructive pulmonary disease (COPD) (HCC) [J44.1] COPD exacerbation (HCC) [J44.1] Community acquired pneumonia, unspecified laterality [J18.9] Patient Active Problem List   Diagnosis Date Noted   Acute respiratory failure (HCC) 04/03/2024   Acute on chronic respiratory failure (HCC) 03/04/2024   Anxiety 01/15/2024   Cocaine abuse (HCC) 09/13/2023   CAP (community acquired pneumonia) 06/24/2023   Lung nodule 06/24/2023   COVID-19 virus infection 06/24/2023   Elevated MCV 03/06/2023   Acute metabolic encephalopathy 02/01/2023   Leukocytosis 10/06/2020   Goals of care, counseling/discussion    Palliative care by specialist    Encounter for smoking cessation counseling    Hyperglycemia 04/12/2020   Chronic respiratory failure with hypoxia and hypercapnia (HCC) 01/22/2020   Acute on chronic respiratory failure with hypoxia and hypercapnia (HCC) 01/23/2018   Obesity, Class III, BMI 40-49.9 (morbid obesity) (HCC) 01/01/2018   Acute exacerbation of  chronic obstructive pulmonary disease (COPD) (HCC) 01/01/2018   Essential hypertension 12/09/2015   Non-small cell cancer of right lung (in remission) 12/09/2015   GERD (gastroesophageal reflux disease) 12/09/2015   Tobacco use disorder 12/17/2014   COPD exacerbation (HCC) 12/17/2014   Obesity 12/17/2014   Dyspnea    Difficulty  walking 04/02/2013   Hip weakness 04/02/2013   PCP:  Renato Dorothey HERO, NP Pharmacy:   [facility] [place] - [address] [place] [address] Phone: [phone] Fax: [phone]     Social Drivers of Health (SDOH) Social History: SDOH Screenings   Food Insecurity: No Food Insecurity (09/13/2024)  Housing: Low Risk  (09/13/2024)  Recent Concern: Housing - High Risk (07/13/2024)  Transportation Needs: No Transportation Needs (09/13/2024)  Utilities: Not At Risk (09/13/2024)  Tobacco Use: High Risk (09/13/2024)   SDOH Interventions:     Readmission Risk Interventions    07/12/2024    4:22 PM 05/20/2024    9:57 AM 04/02/2024    2:02 PM  Readmission Risk Prevention Plan  Transportation Screening Complete Complete Complete  Medication Review Oceanographer) Complete Complete Complete  PCP or Specialist appointment within 3-5 days of discharge Complete    HRI or Home Care Consult Complete Complete Complete  SW Recovery Care/Counseling Consult Complete Complete Complete  Palliative Care Screening Not Applicable Not Applicable Not Applicable  Skilled Nursing Facility Not Applicable Not Applicable Not Applicable

## 2024-09-13 NOTE — Plan of Care (Signed)

## 2024-09-13 NOTE — ED Provider Notes (Signed)
 [facility]-EMERGENCY DEPT [facility] Emergency Department Provider Note MRN:  [id number]  Arrival date & time: 09/13/24     Chief Complaint   Shortness of Breath   History of Present Illness   Miguel Marsh is a 63 y.o. year-old male with a history of COPD, lung cancer presenting to the ED with chief complaint of shortness of breath.  Worsening shortness of breath over the past 2 days, much worse tonight.  Having chest pain but this has been present ever since receiving radiation in the chest for his lung cancer.  Denies fever.  Review of Systems  A thorough review of systems was obtained and all systems are negative except as noted in the HPI and PMH.   Patient's Health History    Past Medical History:  Diagnosis Date   Arthritis    Bronchitis    Cancer (HCC)    metastatic NSCLC (followed by [facility])   COPD (chronic obstructive pulmonary disease) (HCC)    HTN (hypertension)     Past Surgical History:  Procedure Laterality Date   LUNG BIOPSY     TOTAL HIP ARTHROPLASTY     TUMOR REMOVAL Right 12/04/2017    History reviewed. No pertinent family history.  Social History   Socioeconomic History   Marital status: Single    Spouse name: Not on file   Number of children: Not on file   Years of education: Not on file   Highest education level: Not on file  Occupational History   Not on file  Tobacco Use   Smoking status: Every Day    Current packs/day: 0.10    Types: Cigarettes   Smokeless tobacco: Never  Vaping Use   Vaping status: Never Used  Substance and Sexual Activity   Alcohol  use: Not Currently    Comment: occ   Drug use: Yes    Types: Marijuana, Cocaine    Comment: occ   Sexual activity: Not Currently  Other Topics Concern   Not on file  Social History Narrative   Not on file   Social Drivers of Health   Financial Resource Strain: Not on file  Food Insecurity: No Food Insecurity (07/13/2024)   Hunger Vital Sign    Worried About Running Out of Food  in the Last Year: Never true    Ran Out of Food in the Last Year: Never true  Transportation Needs: No Transportation Needs (07/13/2024)   PRAPARE - Administrator, Civil Service (Medical): No    Lack of Transportation (Non-Medical): No  Physical Activity: Not on file  Stress: Not on file  Social Connections: Not on file  Intimate Partner Violence: Not At Risk (07/13/2024)   Humiliation, Afraid, Rape, and Kick questionnaire    Fear of Current or Ex-Partner: No    Emotionally Abused: No    Physically Abused: No    Sexually Abused: No     Physical Exam   Vitals:   09/13/24 0359 09/13/24 0400  BP:  130/69  Pulse:  82  Resp:  13  Temp:    SpO2: 98% 98%    CONSTITUTIONAL: Chronically ill-appearing, moderate respiratory distress NEURO/PSYCH:  Alert and oriented x 3, no focal deficits EYES:  eyes equal and reactive ENT/NECK:  no LAD, no JVD CARDIO: Regular rate, well-perfused, normal S1 and S2 PULM:  CTAB no wheezing or rhonchi GI/GU:  non-distended, non-tender MSK/SPINE:  No gross deformities, no edema SKIN:  no rash, atraumatic   *Additional and/or pertinent findings included in  MDM below  Diagnostic and Interventional Summary    EKG Interpretation Date/Time:  Saturday September 13 2024 03:16:03 EST Ventricular Rate:  94 PR Interval:  127 QRS Duration:  85 QT Interval:  348 QTC Calculation: 436 R Axis:   -28  Text Interpretation: Sinus rhythm Borderline left axis deviation Abnormal R-wave progression, late transition Confirmed by Theadore Sharper [phone]) on 09/13/2024 4:34:23 AM       Labs Reviewed  BASIC METABOLIC PANEL WITH GFR - Abnormal; Notable for the following components:      Result Value   CO2 39 (*)    Glucose, Bld 112 (*)    Calcium 8.7 (*)    Anion gap 4 (*)    All other components within normal limits  CBC - Abnormal; Notable for the following components:   RBC 4.01 (*)    Hemoglobin 11.7 (*)    HCT 38.8 (*)    All other components  within normal limits  TROPONIN T, HIGH SENSITIVITY - Abnormal; Notable for the following components:   Troponin T High Sensitivity 21 (*)    All other components within normal limits  PRO BRAIN NATRIURETIC PEPTIDE  TROPONIN T, HIGH SENSITIVITY    CT Angio Chest Pulmonary Embolism (PE) W or WO Contrast  Final Result    DG Chest Portable 1 View  Final Result      Medications  cefTRIAXone  (ROCEPHIN ) 2 g in sodium chloride  0.9 % 100 mL IVPB (has no administration in time range)  azithromycin  (ZITHROMAX ) 500 mg in sodium chloride  0.9 % 250 mL IVPB (has no administration in time range)  ipratropium-albuterol  (DUONEB) 0.5-2.5 (3) MG/3ML nebulizer solution 3 mL (3 mLs Nebulization Given 09/13/24 0359)  methylPREDNISolone  sodium succinate (SOLU-MEDROL ) 125 mg/2 mL injection 125 mg (125 mg Intravenous Given 09/13/24 0350)  iohexol  (OMNIPAQUE ) 350 MG/ML injection 75 mL (75 mLs Intravenous Contrast Given 09/13/24 0447)     Procedures  /  Critical Care Procedures  ED Course and Medical Decision Making  Initial Impression and Ddx Patient with increased work of breathing, poor air movement, favoring COPD exacerbation.  Given the acute onset and patient's history of cancer, PE is also a consideration.  Past medical/surgical history that increases complexity of ED encounter: COPD, lung cancer  Interpretation of Diagnostics I personally reviewed the EKG and my interpretation is as follows: Sinus rhythm without obvious ischemic changes  No significant blood count or electrolyte disturbance.  Chronic CO2 retention, mildly elevated troponin  Patient Reassessment and Ultimate Disposition/Management     CTA revealing evidence of possible pneumonia, no PE.  Admitted to medicine.  Patient management required discussion with the following services or consulting groups:  Hospitalist Service  Complexity of Problems Addressed Acute illness or injury that poses threat of life of bodily  function  Additional Data Reviewed and Analyzed Further history obtained from: EMS on arrival  Additional Factors Impacting ED Encounter Risk Consideration of hospitalization  Sharper HERO. Theadore, MD [facility] Emergency Medicine [facility] [email or web address]  Final Clinical Impressions(s) / ED Diagnoses     ICD-10-CM   1. COPD exacerbation (HCC)  J44.1     2. Community acquired pneumonia, unspecified laterality  J18.9       ED Discharge Orders     None        Discharge Instructions Discussed with and Provided to Patient:   Discharge Instructions   None      Theadore Sharper HERO, MD 09/13/24 [phone]

## 2024-09-13 NOTE — H&P (Addendum)
 History and Physical    Patient: Miguel Marsh FMW:[id number] DOB: December 14, 1960 DOA: 09/13/2024 DOS: the patient was seen and examined on 09/13/2024 PCP: Renato Dorothey HERO, NP  Patient coming from: [facility]  Chief Complaint:  Chief Complaint  Patient presents with   Shortness of Breath   HPI: Miguel Marsh is a 63 y.o. male with medical history significant of hypertension, COPD, chronic hypoxic respiratory failure on 4 L of oxygen , stage IV non-small cell lung cancer, chronic pain who presents to the emergency department via EMS due to 1 week onset of shortness of breath.  SOB worsened in the last 2 days and much worse tonight.  He complained of chest pain which was related to chest radiation for his lung cancer.  ED course In the emergency department, patient was hemodynamically stable.  Workup in the ED showed normocytic anemia.  BMP was normal except for bicarb of 39, blood glucose 112.  proBNP was normal, troponin x 2 was flat at 21. CT angiography of chest showed no evidence of PE, but was suggestive of left upper lobe atelectasis and posterior RUL atypical/viral pneumonitis versus inflammation.  RLL with mild bronchiectasis with clinical correlation for chronic small airways disease or prior infection. Patient was treated with IV ceftriaxone  and azithromycin , breathing treatment was provided, IV Solu-Medrol  25 mg x 1 was given.  Review of Systems: As mentioned in the history of present illness. All other systems reviewed and are negative. Past Medical History:  Diagnosis Date   Arthritis    Bronchitis    Cancer (HCC)    metastatic NSCLC (followed by [facility])   COPD (chronic obstructive pulmonary disease) (HCC)    HTN (hypertension)    Past Surgical History:  Procedure Laterality Date   LUNG BIOPSY     TOTAL HIP ARTHROPLASTY     TUMOR REMOVAL Right 12/04/2017   Social History:  reports that he has been smoking cigarettes. He has never used smokeless  tobacco. He reports that he does not currently use alcohol . He reports current drug use. Drugs: Marijuana and Cocaine.  Allergies  Allergen Reactions   Ibuprofen  Shortness Of Breath    History reviewed. No pertinent family history.  Prior to Admission medications   Medication Sig Start Date End Date Taking? Authorizing Provider  acetaminophen  (TYLENOL ) 325 MG tablet Take 2 tablets (650 mg total) by mouth every 6 (six) hours as needed for mild pain (pain score 1-3) (or Fever >/= 101). 01/31/24   Pearlean Manus, MD  albuterol  (PROVENTIL ) (2.5 MG/3ML) 0.083% nebulizer solution Inhale 3 mLs (2.5 mg total) into the lungs every 6 (six) hours as needed for wheezing or shortness of breath. 01/31/24   Pearlean Manus, MD  albuterol  (VENTOLIN  HFA) 108 (90 Base) MCG/ACT inhaler Inhale 2 puffs into the lungs every 6 (six) hours as needed for shortness of breath or wheezing (Cough). 01/31/24   Pearlean Manus, MD  BREZTRI  AEROSPHERE 160-9-4.8 MCG/ACT AERO Inhale 2 puffs into the lungs 2 (two) times daily. 01/31/24   Pearlean Manus, MD  clonazePAM  (KLONOPIN ) 1 MG tablet Take 1 tablet (1 mg total) by mouth 2 (two) times daily as needed for anxiety. 11/01/23   Pearlean Manus, MD  doxycycline  (VIBRAMYCIN ) 100 MG capsule Take 1 capsule (100 mg total) by mouth 2 (two) times daily. 08/03/24   Daralene Lonni BIRCH, PA-C  gabapentin  (NEURONTIN ) 100 MG capsule Take 1 capsule (100 mg total) by mouth 2 (two) times daily. 07/15/24   Ricky Fines, MD  hydrochlorothiazide  (HYDRODIURIL ) 25 MG tablet Take 25 mg by mouth every morning.    [provider]  omeprazole  (PRILOSEC) 40 MG capsule Take 1 capsule (40 mg total) by mouth in the morning and at bedtime. 07/15/24   Ricky Fines, MD  OXYGEN  Inhale 4 L into the lungs continuous.    [provider]    Physical Exam: Vitals:   09/13/24 0330 09/13/24 0345 09/13/24 0359 09/13/24 0400  BP: 134/66 114/68  130/69  Pulse: 98 88  82  Resp: 17 11  13   Temp:       TempSrc:      SpO2: 95% 97% 98% 98%  Weight:      Height:       General: Chronically ill-appearing, awake and alert and oriented x3. Not in any acute distress.  HEENT: NCAT.  PERRLA. EOMI. Sclerae anicteric.  Moist mucosal membranes. Neck: Neck supple without lymphadenopathy. No carotid bruits. No masses palpated.  Cardiovascular: Regular rate with normal S1-S2 sounds. No murmurs, rubs or gallops auscultated. No JVD.  Respiratory: Diffuse rhonchi, worse in lower lobes.  No accessory muscle use. Abdomen: Soft, nontender, nondistended. Active bowel sounds. No masses or hepatosplenomegaly  Skin: No rashes, lesions, or ulcerations.  Dry, warm to touch. Musculoskeletal: 2+ dorsalis pedis and radial pulses. Good ROM.  No contractures  Psychiatric: Intact judgment and insight.  Mood appropriate to current condition. Neurologic: No focal neurological deficits. Strength is 5/5 x 4.  CN II - XII grossly intact.  Data Reviewed: EKG personally reviewed showed normal sinus rhythm at rate of 94 bpm  Assessment and Plan: Acute exacerbation of COPD Chronic respiratory failure with hypoxia Continue duo nebs, Mucinex , Solu-Medrol , azithromycin . Continue Protonix  to prevent steroid-induced ulcer Continue incentive spirometry and flutter valve Continue supplemental oxygen  per home regimen  Atypical/viral pneumonitis Presumed CAP POA Chest x-ray suggestive of atypical/viral pneumonitis Patient was started on ceftriaxone  and azithromycin , we shall continue same at this time with plan to de-escalate/discontinue based on blood culture, sputum culture, urine Legionella, strep pneumo and procalcitonin Consider stopping antibiotics if no fever in 24 hours Continue Tylenol  as needed Continue Mucinex , incentive spirometry, flutter valve   Elevated troponin due to type II demand ischemia Troponin x 2 was flat at 21, it was 27 > 23 on 10/5 Patient only complaint of chronic chest pain related to  radiotherapy to the chest due to history of lung cancer  GERD Continue Protonix   Chronic diastolic heart failure Stable and compensated Continue hydrochlorothiazide   Essential hypertension Continue home HCTZ  Mood disorder Continue clonazepam   Stage IV poorly differentiated NSCLC. Patient follows with [facility] Continue outpatient follow-up  Advance Care Planning: Full code  Consults: None  Family Communication: None at bedside  Severity of Illness: The appropriate patient status for this patient is OBSERVATION. Observation status is judged to be reasonable and necessary in order to provide the required intensity of service to ensure the patient's safety. The patient's presenting symptoms, physical exam findings, and initial radiographic and laboratory data in the context of their medical condition is felt to place them at decreased risk for further clinical deterioration. Furthermore, it is anticipated that the patient will be medically stable for discharge from the hospital within 2 midnights of admission.   Author: Tishanna Dunford, DO 09/13/2024 5:58 AM  For on call review [email or web address].

## 2024-09-14 DIAGNOSIS — J189 Pneumonia, unspecified organism: Secondary | ICD-10-CM | POA: Diagnosis not present

## 2024-09-14 DIAGNOSIS — J441 Chronic obstructive pulmonary disease with (acute) exacerbation: Secondary | ICD-10-CM | POA: Diagnosis not present

## 2024-09-14 LAB — CBC
HCT: 33.9 % — ABNORMAL LOW (ref 39.0–52.0)
Hemoglobin: 10.4 g/dL — ABNORMAL LOW (ref 13.0–17.0)
MCH: 28.8 pg (ref 26.0–34.0)
MCHC: 30.7 g/dL (ref 30.0–36.0)
MCV: 93.9 fL (ref 80.0–100.0)
Platelets: 199 K/uL (ref 150–400)
RBC: 3.61 MIL/uL — ABNORMAL LOW (ref 4.22–5.81)
RDW: 12.5 % (ref 11.5–15.5)
WBC: 10.7 K/uL — ABNORMAL HIGH (ref 4.0–10.5)
nRBC: 0 % (ref 0.0–0.2)

## 2024-09-14 LAB — MISC LABCORP TEST (SEND OUT): Labcorp test code: 83935

## 2024-09-14 LAB — COMPREHENSIVE METABOLIC PANEL WITH GFR
ALT: 17 U/L (ref 0–44)
AST: 20 U/L (ref 15–41)
Albumin: 3.8 g/dL (ref 3.5–5.0)
Alkaline Phosphatase: 69 U/L (ref 38–126)
Anion gap: 6 (ref 5–15)
BUN: 15 mg/dL (ref 8–23)
CO2: 35 mmol/L — ABNORMAL HIGH (ref 22–32)
Calcium: 8.7 mg/dL — ABNORMAL LOW (ref 8.9–10.3)
Chloride: 99 mmol/L (ref 98–111)
Creatinine, Ser: 0.81 mg/dL (ref 0.61–1.24)
GFR, Estimated: >60 mL/min (ref >60)
Glucose, Bld: 215 mg/dL — ABNORMAL HIGH (ref 70–99)
Potassium: 4.3 mmol/L (ref 3.5–5.1)
Sodium: 139 mmol/L (ref 135–145)
Total Bilirubin: 0.2 mg/dL (ref 0.0–1.2)
Total Protein: 6.2 g/dL — ABNORMAL LOW (ref 6.5–8.1)

## 2024-09-14 MED ORDER — HYDROCODONE-ACETAMINOPHEN 5-325 MG PO TABS
1.0000 | ORAL_TABLET | Freq: Two times a day (BID) | ORAL | Status: DC | PRN
  Administered 2024-09-14 – 2024-09-15 (×2): 1 via ORAL
  Filled 2024-09-14 (×2): qty 1

## 2024-09-14 MED ORDER — METHYLPREDNISOLONE SODIUM SUCC 40 MG IJ SOLR
40.0000 mg | Freq: Every day | INTRAMUSCULAR | Status: DC
  Administered 2024-09-15: 40 mg via INTRAVENOUS
  Filled 2024-09-14: qty 1

## 2024-09-14 MED ORDER — FUROSEMIDE 10 MG/ML IJ SOLN
40.0000 mg | Freq: Every day | INTRAMUSCULAR | Status: DC
  Administered 2024-09-14 – 2024-09-15 (×2): 40 mg via INTRAVENOUS
  Filled 2024-09-14 (×2): qty 4

## 2024-09-14 MED ORDER — GABAPENTIN 100 MG PO CAPS
100.0000 mg | ORAL_CAPSULE | Freq: Two times a day (BID) | ORAL | Status: DC
  Filled 2024-09-14: qty 1

## 2024-09-14 MED ORDER — PREGABALIN 50 MG PO CAPS
100.0000 mg | ORAL_CAPSULE | Freq: Two times a day (BID) | ORAL | Status: DC
  Administered 2024-09-14 – 2024-09-15 (×3): 100 mg via ORAL
  Filled 2024-09-14 (×3): qty 2

## 2024-09-14 NOTE — Hospital Course (Signed)
 63 y.o. male with medical history significant of hypertension, COPD, chronic hypoxic respiratory failure on 4 L of oxygen , stage IV non-small cell lung cancer, chronic pain who presents to the emergency department via EMS due to 1 week onset of shortness of breath.    Assessment and Plan:   Acute exacerbation of chronic COPD/chronic hypoxic respiratory failure - Currently on 3-4 L which is patient's baseline however still having dyspnea worse than his baseline.  CT imaging noting ground glass opacities, possible viral etiology.  Improving with IV Solu-Medrol , scheduled nebulizers.  Anticipate discharge tomorrow.   Community-acquired pneumonia - Likely viral etiology.  COVID/flu/RSV negative.  Procalcitonin low.  Strep pneumo negative.  Continues on empiric antibiotics, likely discontinue upon discharge.   Elevated troponin - Minimally elevated flat suggesting demand ischemia.  Continue to monitor.   Stage IV poorly differentiated NSCLC - Patient follows with [facility].   Chronic HPpEF -does not appear to be decompensated.  Will give lasix  IV given initial volume resuscitation.   Physical debilitation muscle weakness -Will order PT eval.  Likely discharge home with home health.

## 2024-09-14 NOTE — Progress Notes (Signed)
 Progress Note   Patient: Miguel Marsh FMW:[id number] DOB: 06-06-1961 DOA: 09/13/2024  DOS: the patient was seen and examined on 09/14/2024   Brief hospital course:  63 y.o. male with medical history significant of hypertension, COPD, chronic hypoxic respiratory failure on 4 L of oxygen , stage IV non-small cell lung cancer, chronic pain who presents to the emergency department via EMS due to 1 week onset of shortness of breath.   Assessment and Plan:  Acute exacerbation of chronic COPD/chronic hypoxic respiratory failure - Currently on 3-4 L which is patient's baseline however still having dyspnea worse than his baseline.  CT imaging noting ground glass opacities, possible viral etiology.  Improving with IV Solu-Medrol , scheduled nebulizers.  Anticipate discharge tomorrow.  Community-acquired pneumonia - Likely viral etiology.  COVID/flu/RSV negative.  Procalcitonin low.  Strep pneumo negative.  Continues on empiric antibiotics, likely discontinue upon discharge.  Elevated troponin - Minimally elevated flat suggesting demand ischemia.  Continue to monitor.  Stage IV poorly differentiated NSCLC - Patient follows with [facility].  Chronic HPpEF -does not appear to be decompensated.  Will give lasix  IV given initial volume resuscitation.  Physical debilitation muscle weakness -Will order PT eval.  Likely discharge home with home health.  Subjective: Patient feeling improved this morning but still having some shortness of breath.  States he feels better just not quite at his baseline.  Denies any worsening fever, chills, chest pain, nausea, vomiting, abdominal pain.  Still has a dry cough and dyspnea on exertion.  Physical Exam:  Vitals:   09/13/24 1935 09/14/24 0426 09/14/24 0714 09/14/24 0717  BP: (!) 154/74 (!) 153/76    Pulse: 95 80    Resp: 20 16    Temp: 98.6 F (37 C) (!) 97.5 F (36.4 C)    TempSrc: Oral Oral    SpO2: 95% 98% 97% 97%  Weight:      Height:         GENERAL:  Alert, pleasant, no acute distress, disheveled HEENT:  EOMI, nasal cannula CARDIOVASCULAR:  RRR, no murmurs appreciated RESPIRATORY: Dry cough, poor air movement bilaterally GASTROINTESTINAL:  Soft, nontender, nondistended EXTREMITIES:  No LE edema bilaterally NEURO:  No new focal deficits appreciated SKIN:  No rashes noted PSYCH:  Appropriate mood and affect     Data Reviewed:  Imaging Studies: CT Angio Chest Pulmonary Embolism (PE) W or WO Contrast Result Date: 09/13/2024 EXAM: CTA CHEST 09/13/2024 04:49:39 AM TECHNIQUE: CTA of the chest was performed without and with the administration of 75 mL of intravenous contrast (iohexol  (OMNIPAQUE ) 350 MG/ML injection 75 mL IOHEXOL  350 MG/ML SOLN). Multiplanar reformatted images are provided for review. MIP images are provided for review. Automated exposure control, iterative reconstruction, and/or weight based adjustment of the mA/kV was utilized to reduce the radiation dose to as low as reasonably achievable. COMPARISON: CT angiogram of the chest dated 12/04/2023. CLINICAL HISTORY: Pulmonary embolism (PE) suspected, high prob. FINDINGS: PULMONARY ARTERIES: Pulmonary arteries are adequately opacified for evaluation. There is no intraluminal thrombus evident to indicate pulmonary embolus. Main pulmonary artery is normal in caliber. MEDIASTINUM: The heart and pericardium demonstrate no acute abnormality. There is no acute abnormality of the thoracic aorta. LYMPH NODES: No mediastinal, hilar or axillary lymphadenopathy. LUNGS AND PLEURA: There is streaky scarring again demonstrated in the base of the lingula. There is also mild dependent atelectasis within the left upper lobe. There are few ground-glass and reticular opacities also present posteriorly within the right upper lobe. There are patchy, streaky opacities in  the posterior periphery of the right lower lobe, associated with mild bronchiectasis. There is mild central lobular emphysema.  No evidence of pleural effusion or pneumothorax. UPPER ABDOMEN: There are calcified stones within the gallbladder. There is a simple cyst arising from the superior pole of the left kidney. SOFT TISSUES AND BONES: No acute bone or soft tissue abnormality. IMPRESSION: 1. No evidence of pulmonary embolism 2. Streaky scarring in the lingular base and mild dependent atelectasis in the left upper lobe 3. Few ground-glass and reticular opacities in the posterior right upper lobe; correlate clinically for atypical/viral pneumonitis versus inflammation 4. Patchy, streaky opacities in the posterior periphery of the right lower lobe with mild bronchiectasis; consider clinical correlation for chronic small airways disease or prior infection 5. Mild centrilobular emphysema; if patient is 63 years old, consider evaluation for eligibility for low-dose CT lung cancer screening program per guidelines 6. Incidental cholelithiasis 7. 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 Mild patchy bilateral lower lobe opacities, like atelectasis. No pleural effusion. No pneumothorax. HEART AND MEDIASTINUM: No acute abnormality of the cardiac and mediastinal silhouettes. BONES AND SOFT TISSUES: No acute osseous abnormality. IMPRESSION: 1. Mild bilateral lower lobe opacities, likely atelectasis. Electronically signed by: Pinkie Pebbles MD 09/13/2024 03:39 AM EST RP Workstation: [id number]    There are no new results to review at this time.  Previous records (including but not limited to H&P, progress notes, nursing notes, TOC management) were reviewed in  assessment of this patient.  Labs: CBC: Recent Labs  Lab 09/13/24 0324 09/14/24 0456  WBC 7.2 10.7*  HGB 11.7* 10.4*  HCT 38.8* 33.9*  MCV 96.8 93.9  PLT 214 199   Basic Metabolic Panel: Recent Labs  Lab 09/13/24 0324 09/13/24 0340 09/14/24 0456  NA 144  --  139  K 4.1  --  4.3  CL 101  --  99  CO2 39*  --  35*  GLUCOSE 112*  --  215*  BUN 15  --  15  CREATININE 0.93  --  0.81  CALCIUM 8.7*  --  8.7*  MG  --  1.9  --   PHOS  --  3.1  --    Liver Function Tests: Recent Labs  Lab 09/14/24 0456  AST 20  ALT 17  ALKPHOS 69  BILITOT 0.2  PROT 6.2*  ALBUMIN 3.8   CBG: No results for input(s): GLUCAP in the last 168 hours.  Scheduled Meds:  budesonide -glycopyrrolate -formoterol   2 puff Inhalation BID   dextromethorphan -guaiFENesin   1 tablet Oral BID   enoxaparin  (LOVENOX ) injection  60 mg Subcutaneous Q24H   hydrochlorothiazide   25 mg Oral q morning   ipratropium-albuterol   3 mL Nebulization TID   [START ON 09/15/2024] methylPREDNISolone  (SOLU-MEDROL ) injection  40 mg Intravenous Daily   pantoprazole   40 mg Oral Daily   Continuous Infusions:  azithromycin  Stopped (09/14/24 0520)   cefTRIAXone  (ROCEPHIN )  IV 2 g (09/14/24 0629)   PRN Meds:.acetaminophen  **OR** acetaminophen , albuterol , clonazePAM , ondansetron  **OR** ondansetron  (ZOFRAN ) IV  Family Communication: None at bedside  Disposition: Status is: Observation The patient remains OBS appropriate and will d/c before 2 midnights.     Time spent: 59  minutes  Length of inpatient stay: 0 days  Author: Carliss LELON Canales, DO 09/14/2024 12:17 PM  For on call review [email or web address].

## 2024-09-14 NOTE — Care Management Obs Status (Signed)
 MEDICARE OBSERVATION STATUS NOTIFICATION   Patient Details  Name: Miguel Marsh MRN: [id number] Date of Birth: 1960-12-29   Medicare Observation Status Notification Given:  Yes    Sharlyne Stabs, RN 09/14/2024, 10:29 AM

## 2024-09-14 NOTE — Plan of Care (Signed)

## 2024-09-15 DIAGNOSIS — J441 Chronic obstructive pulmonary disease with (acute) exacerbation: Secondary | ICD-10-CM | POA: Diagnosis not present

## 2024-09-15 DIAGNOSIS — I5033 Acute on chronic diastolic (congestive) heart failure: Secondary | ICD-10-CM

## 2024-09-15 DIAGNOSIS — J9611 Chronic respiratory failure with hypoxia: Secondary | ICD-10-CM | POA: Diagnosis not present

## 2024-09-15 LAB — BASIC METABOLIC PANEL WITH GFR
Anion gap: 5 (ref 5–15)
BUN: 18 mg/dL (ref 8–23)
CO2: 40 mmol/L — ABNORMAL HIGH (ref 22–32)
Calcium: 8.8 mg/dL — ABNORMAL LOW (ref 8.9–10.3)
Chloride: 98 mmol/L (ref 98–111)
Creatinine, Ser: 0.73 mg/dL (ref 0.61–1.24)
GFR, Estimated: >60 mL/min (ref >60)
Glucose, Bld: 138 mg/dL — ABNORMAL HIGH (ref 70–99)
Potassium: 3.5 mmol/L (ref 3.5–5.1)
Sodium: 142 mmol/L (ref 135–145)

## 2024-09-15 LAB — CBC
HCT: 33.7 % — ABNORMAL LOW (ref 39.0–52.0)
Hemoglobin: 10.7 g/dL — ABNORMAL LOW (ref 13.0–17.0)
MCH: 30.1 pg (ref 26.0–34.0)
MCHC: 31.8 g/dL (ref 30.0–36.0)
MCV: 94.7 fL (ref 80.0–100.0)
Platelets: 196 K/uL (ref 150–400)
RBC: 3.56 MIL/uL — ABNORMAL LOW (ref 4.22–5.81)
RDW: 13 % (ref 11.5–15.5)
WBC: 10.7 K/uL — ABNORMAL HIGH (ref 4.0–10.5)
nRBC: 0 % (ref 0.0–0.2)

## 2024-09-15 MED ORDER — BENZONATATE 100 MG PO CAPS: 100.0000 mg | ORAL_CAPSULE | Freq: Three times a day (TID) | ORAL | 0 refills | Status: DC | PRN

## 2024-09-15 MED ORDER — PREDNISONE 10 MG PO TABS: ORAL_TABLET | ORAL | 0 refills | Status: AC

## 2024-09-15 NOTE — NC FL2 (Signed)
 [place]  MEDICAID [id number] LEVEL OF CARE FORM     IDENTIFICATION  Patient Name: Miguel Marsh Birthdate: [birth year] Sex: male Admission Date (Current Location): 09/13/2024  [facility] and [place] Number:  [name] and Address:  [facility],  [address] [place] [address]      Provider Number: [id number]  Attending Physician Name and Address:  Arlon Carliss ORN, DO  Relative Name and Phone Number:  Desjuan, Stearns (Brother)  [phone]    Current Level of Care: Hospital Recommended Level of Care: Family Care Home Prior Approval Number:    Date Approved/Denied:   PASRR Number:    Discharge Plan: Other (Comment) [facility])    Current Diagnoses: Patient Active Problem List   Diagnosis Date Noted   Acute respiratory failure (HCC) 04/03/2024   Acute on chronic respiratory failure (HCC) 03/04/2024   Anxiety 01/15/2024   Cocaine abuse (HCC) 09/13/2023   CAP (community acquired pneumonia) 06/24/2023   Lung nodule 06/24/2023   COVID-19 virus infection 06/24/2023   Elevated MCV 03/06/2023   Acute metabolic encephalopathy 02/01/2023   Leukocytosis 10/06/2020   Goals of care, counseling/discussion    Palliative care by specialist    Encounter for smoking cessation counseling    Hyperglycemia 04/12/2020   Chronic respiratory failure with hypoxia and hypercapnia (HCC) 01/22/2020   Acute on chronic respiratory failure with hypoxia and hypercapnia (HCC) 01/23/2018   Obesity, Class III, BMI 40-49.9 (morbid obesity) (HCC) 01/01/2018   Acute exacerbation of chronic obstructive pulmonary disease (COPD) (HCC) 01/01/2018   Essential hypertension 12/09/2015   Non-small cell cancer of right lung (in remission) 12/09/2015   GERD (gastroesophageal reflux disease) 12/09/2015   Tobacco use disorder 12/17/2014   COPD exacerbation (HCC) 12/17/2014   Obesity 12/17/2014   Dyspnea    Difficulty walking 04/02/2013   Hip weakness 04/02/2013    Orientation RESPIRATION  BLADDER Height & Weight     Self, Time, Situation, Place  O2 (4L) Continent Weight: 270 lb 4.5 oz (122.6 kg) Height:  5' 6 (167.6 cm)  BEHAVIORAL SYMPTOMS/MOOD NEUROLOGICAL BOWEL NUTRITION STATUS      Continent Diet (low sodium heart healthy)  AMBULATORY STATUS COMMUNICATION OF NEEDS Skin   Limited Assist Verbally Normal                       Personal Care Assistance Level of Assistance  Bathing, Feeding, Dressing Bathing Assistance: Limited assistance Feeding assistance: Limited assistance Dressing Assistance: Limited assistance     Functional Limitations Info  Sight, Hearing, Speech Sight Info: Adequate Hearing Info: Adequate Speech Info: Adequate    SPECIAL CARE FACTORS FREQUENCY  PT (By licensed PT)     PT Frequency: home health              Contractures Contractures Info: Not present    Additional Factors Info  Code Status, Allergies, Psychotropic Code Status Info: Full Allergies Info: Ibuprofen  Psychotropic Info: Klonopin          Current Medications (09/15/2024):  This is the current hospital active medication list Current Facility-Administered Medications  Medication Dose Route Frequency Provider Last Rate Last Admin   acetaminophen  (TYLENOL ) tablet 650 mg  650 mg Oral Q6H PRN Adefeso, Oladapo, DO   650 mg at 09/13/24 2124   Or   acetaminophen  (TYLENOL ) suppository 650 mg  650 mg Rectal Q6H PRN Adefeso, Oladapo, DO       albuterol  (PROVENTIL ) (2.5 MG/3ML) 0.083% nebulizer solution 2.5 mg  2.5 mg Nebulization Q4H PRN  Arlon Carliss ORN, DO   2.5 mg at 09/14/24 1817   azithromycin  (ZITHROMAX ) 500 mg in sodium chloride  0.9 % 250 mL IVPB  500 mg Intravenous Q24H Bero, Michael M, MD 250 mL/hr at 09/15/24 0524 500 mg at 09/15/24 9475   budesonide -glycopyrrolate -formoterol  (BREZTRI ) 160-9-4.8 MCG/ACT inhaler 2 puff  2 puff Inhalation BID Arlon Carliss ORN, DO   2 puff at 09/15/24 0813   cefTRIAXone  (ROCEPHIN ) 2 g in sodium chloride  0.9 % 100 mL IVPB  2 g  Intravenous Q24H Adefeso, Oladapo, DO 200 mL/hr at 09/15/24 0809 2 g at 09/15/24 0809   clonazePAM  (KLONOPIN ) tablet 1 mg  1 mg Oral BID PRN Adefeso, Oladapo, DO   1 mg at 09/14/24 1223   dextromethorphan -guaiFENesin  (MUCINEX  DM) 30-600 MG per 12 hr tablet 1 tablet  1 tablet Oral BID Adefeso, Oladapo, DO   1 tablet at 09/15/24 9185   enoxaparin  (LOVENOX ) injection 60 mg  60 mg Subcutaneous Q24H Arlon Carliss ORN, DO   60 mg at 09/14/24 2140   furosemide  (LASIX ) injection 40 mg  40 mg Intravenous Daily Arlon Carliss ORN, DO   40 mg at 09/15/24 0814   hydrochlorothiazide  (HYDRODIURIL ) tablet 25 mg  25 mg Oral q morning Adefeso, Oladapo, DO   25 mg at 09/15/24 9185   HYDROcodone -acetaminophen  (NORCO/VICODIN) 5-325 MG per tablet 1 tablet  1 tablet Oral BID PRN Arlon Carliss ORN, DO   1 tablet at 09/15/24 0532   ipratropium-albuterol  (DUONEB) 0.5-2.5 (3) MG/3ML nebulizer solution 3 mL  3 mL Nebulization TID Arlon Carliss ORN, DO   3 mL at 09/15/24 0805   methylPREDNISolone  sodium succinate (SOLU-MEDROL ) 40 mg/mL injection 40 mg  40 mg Intravenous Daily Arlon Carliss ORN, DO   40 mg at 09/15/24 0813   ondansetron  (ZOFRAN ) tablet 4 mg  4 mg Oral Q6H PRN Adefeso, Oladapo, DO       Or   ondansetron  (ZOFRAN ) injection 4 mg  4 mg Intravenous Q6H PRN Adefeso, Oladapo, DO       pantoprazole  (PROTONIX ) EC tablet 40 mg  40 mg Oral Daily Adefeso, Oladapo, DO   40 mg at 09/15/24 0816   pregabalin (LYRICA) capsule 100 mg  100 mg Oral BID Arlon Carliss W, DO   100 mg at 09/15/24 9184     Discharge Medications: STOP taking these medications     doxycycline  100 MG capsule Commonly known as: VIBRAMYCIN     gabapentin  100 MG capsule Commonly known as: NEURONTIN            TAKE these medications     acetaminophen  325 MG tablet Commonly known as: TYLENOL  Take 2 tablets (650 mg total) by mouth every 6 (six) hours as needed for mild pain (pain score 1-3) (or Fever >/= 101).    albuterol  108 (90 Base) MCG/ACT  inhaler Commonly known as: VENTOLIN  HFA Inhale 2 puffs into the lungs every 6 (six) hours as needed for shortness of breath or wheezing (Cough).    albuterol  (2.5 MG/3ML) 0.083% nebulizer solution Commonly known as: PROVENTIL  Inhale 3 mLs (2.5 mg total) into the lungs every 6 (six) hours as needed for wheezing or shortness of breath.    benzonatate 100 MG capsule Commonly known as: TESSALON Take 1 capsule (100 mg total) by mouth 3 (three) times daily as needed for cough.    Breztri  Aerosphere 160-9-4.8 MCG/ACT Aero inhaler Generic drug: budesonide -glycopyrrolate -formoterol  Inhale 2 puffs into the lungs 2 (two) times daily.    clonazePAM  1 MG tablet Commonly known  as: KLONOPIN  Take 1 tablet (1 mg total) by mouth 2 (two) times daily as needed for anxiety.    furosemide  20 MG tablet Commonly known as: LASIX  Take 20 mg by mouth daily.    hydrochlorothiazide  25 MG tablet Commonly known as: HYDRODIURIL  Take 25 mg by mouth every morning.    HYDROcodone -acetaminophen  5-325 MG tablet Commonly known as: NORCO/VICODIN Take 1 tablet by mouth 2 (two) times daily as needed.    omeprazole  40 MG capsule Commonly known as: PRILOSEC Take 1 capsule (40 mg total) by mouth in the morning and at bedtime.    OXYGEN  Inhale 4 L into the lungs continuous.    prednisoLONE acetate 1 % ophthalmic suspension Commonly known as: PRED FORTE SMARTSIG:In Eye(s)    predniSONE  10 MG tablet Commonly known as: DELTASONE  Take 4 tablets (40 mg total) by mouth daily for 3 days, THEN 3 tablets (30 mg total) daily for 3 days, THEN 2 tablets (20 mg total) daily for 3 days, THEN 1 tablet (10 mg total) daily for 3 days. Start taking on: September 15, 2024    pregabalin 100 MG capsule Commonly known as: LYRICA Take 100 mg by mouth 2 (two) times daily.    Vitamin D (Ergocalciferol) 1.25 MG (50000 UNIT) Caps capsule Commonly known as: DRISDOL Take 50,000 Units by mouth once a week.      Relevant Imaging  Results:  Relevant Lab Results:   Additional Information SS# [id number]  [place], [place]

## 2024-09-15 NOTE — TOC Transition Note (Addendum)
 Transition of Care [facility]) - Discharge Note   Patient Details  Name: Miguel Marsh MRN: [id number] Date of Birth: 1961/04/27  Transition of Care [facility]) CM/SW Contact:  Mcarthur Saddie Kim, LCSW Phone Number: 09/15/2024, 11:01 AM   Clinical Narrative: Pt d/c today back to [facility]. Pt and Marlene at facility aware and agreeable. Voicemail left for pt's brother, Lyndy notifying him of d/c. MD ordered HHPT. Pt and facility have no preference on agency. Referred and accepted by [facility] with [facility]. D/C summary and FL2 sent to facility. Facility to pick up pt today.       Final next level of care: Group Home Barriers to Discharge: Barriers Resolved   Patient Goals and CMS Choice Patient states their goals for this hospitalization and ongoing recovery are:: return back to [facility] CMS Medicare.gov Compare Post Acute Care list provided to:: Patient Choice offered to / list presented to : Patient      Discharge Placement                Patient to be transferred to facility by: facility Name of family member notified: brother by VM Patient and family notified of of transfer: 09/15/24  Discharge Plan and Services Additional resources added to the After Visit Summary for       Post Acute Care Choice: Durable Medical Equipment                    HH Arranged: PT [facility] Agency: [facility] Date [facility] Agency Contacted: 09/15/24 Time HH Agency Contacted: 1101 Representative spoke with at [facility] Agency: Darleene  Social Drivers of Health (SDOH) Interventions SDOH Screenings   Food Insecurity: No Food Insecurity (09/13/2024)  Housing: Low Risk  (09/13/2024)  Recent Concern: Housing - High Risk (07/13/2024)  Transportation Needs: No Transportation Needs (09/13/2024)  Utilities: Not At Risk (09/13/2024)  Tobacco Use: High Risk (09/13/2024)     Readmission Risk Interventions    07/12/2024    4:22 PM 05/20/2024    9:57 AM 04/02/2024    2:02 PM  Readmission Risk  Prevention Plan  Transportation Screening Complete Complete Complete  Medication Review Ocean[employer]) Complete Complete Complete  PCP or Specialist appointment within 3-5 days of discharge Complete    HRI or Home Care Consult Complete Complete Complete  SW Recovery Care/Counseling Consult Complete Complete Complete  Palliative Care Screening Not Applicable Not Applicable Not Applicable  Skilled Nursing Facility Not Applicable Not Applicable Not Applicable

## 2024-09-15 NOTE — Plan of Care (Signed)

## 2024-09-15 NOTE — Discharge Summary (Signed)
 Physician Discharge Summary   Patient: Miguel Marsh MRN: [id number] DOB: June 15, 1961  Admit date:     09/13/2024  Discharge date: 09/15/24  Discharge Physician: Carliss LELON Canales   PCP: Renato Dorothey HERO, NP   Recommendations at discharge:    Pt to be discharged home with home health.   If you experience worsening fever, chills, chest pain, shortness of breath, or other concerning symptoms, please call your PCP or go to the emergency department immediately.  Discharge Diagnoses: Principal Problem:   Acute exacerbation of chronic obstructive pulmonary disease (COPD) (HCC)  Resolved Problems:   * No resolved hospital problems. *   Hospital Course:  63 y.o. male with medical history significant of hypertension, COPD, chronic hypoxic respiratory failure on 4 L of oxygen , stage IV non-small cell lung cancer, chronic pain who presents to the emergency department via EMS due to 1 week onset of shortness of breath.    Assessment and Plan:   Acute exacerbation of chronic COPD/chronic hypoxic respiratory failure - Tachypnea and dyspnea with cough on presentation.  CT imaging noting ground glass opacities, possible viral etiology.  Showed improvement with IV Solu-Medrol , scheduled nebulizers.  Back to patient's baseline 3-4 L [place].  Will transition patient to p.o. prednisone  taper to take as directed.  Tessalon Perles.   Community-acquired pneumonia - Likely viral etiology.  COVID/flu/RSV negative.  Procalcitonin low.  Strep pneumo negative.  Received empiric antibiotics, likely discontinue upon discharge.   Elevated troponin - Minimally elevated flat suggesting demand ischemia.  Continue to monitor.   Stage IV poorly differentiated NSCLC - Patient follows with [facility].   Chronic HPpEF -does not appear to be decompensated.  Bonded well to lasix  IV given initial volume resuscitation.  Resume normal home p.o. Lasix  regimen upon discharge.   Physical debilitation muscle  weakness -Likely discharge home with home health.   Consultants: None Procedures performed: None Disposition: Home health Diet recommendation:  Discharge Diet Orders (From admission, onward)     Start     Ordered   09/15/24 0000  Diet - low sodium heart healthy        09/15/24 1042           Cardiac diet  DISCHARGE MEDICATION: Allergies as of 09/15/2024       Reactions   Ibuprofen  Shortness Of Breath        Medication List     STOP taking these medications    doxycycline  100 MG capsule Commonly known as: VIBRAMYCIN    gabapentin  100 MG capsule Commonly known as: NEURONTIN        TAKE these medications    acetaminophen  325 MG tablet Commonly known as: TYLENOL  Take 2 tablets (650 mg total) by mouth every 6 (six) hours as needed for mild pain (pain score 1-3) (or Fever >/= 101).   albuterol  108 (90 Base) MCG/ACT inhaler Commonly known as: VENTOLIN  HFA Inhale 2 puffs into the lungs every 6 (six) hours as needed for shortness of breath or wheezing (Cough).   albuterol  (2.5 MG/3ML) 0.083% nebulizer solution Commonly known as: PROVENTIL  Inhale 3 mLs (2.5 mg total) into the lungs every 6 (six) hours as needed for wheezing or shortness of breath.   benzonatate 100 MG capsule Commonly known as: TESSALON Take 1 capsule (100 mg total) by mouth 3 (three) times daily as needed for cough.   Breztri  Aerosphere 160-9-4.8 MCG/ACT Aero inhaler Generic drug: budesonide -glycopyrrolate -formoterol  Inhale 2 puffs into the lungs 2 (two) times daily.   clonazePAM  1 MG tablet  Commonly known as: KLONOPIN  Take 1 tablet (1 mg total) by mouth 2 (two) times daily as needed for anxiety.   furosemide  20 MG tablet Commonly known as: LASIX  Take 20 mg by mouth daily.   hydrochlorothiazide  25 MG tablet Commonly known as: HYDRODIURIL  Take 25 mg by mouth every morning.   HYDROcodone -acetaminophen  5-325 MG tablet Commonly known as: NORCO/VICODIN Take 1 tablet by mouth 2 (two)  times daily as needed.   omeprazole  40 MG capsule Commonly known as: PRILOSEC Take 1 capsule (40 mg total) by mouth in the morning and at bedtime.   OXYGEN  Inhale 4 L into the lungs continuous.   prednisoLONE acetate 1 % ophthalmic suspension Commonly known as: PRED FORTE SMARTSIG:In Eye(s)   predniSONE  10 MG tablet Commonly known as: DELTASONE  Take 4 tablets (40 mg total) by mouth daily for 3 days, THEN 3 tablets (30 mg total) daily for 3 days, THEN 2 tablets (20 mg total) daily for 3 days, THEN 1 tablet (10 mg total) daily for 3 days. Start taking on: September 15, 2024   pregabalin 100 MG capsule Commonly known as: LYRICA Take 100 mg by mouth 2 (two) times daily.   Vitamin D (Ergocalciferol) 1.25 MG (50000 UNIT) Caps capsule Commonly known as: DRISDOL Take 50,000 Units by mouth once a week.         Discharge Exam: Filed Weights   09/13/24 0318 09/13/24 0758  Weight: 102 kg 122.6 kg    GENERAL:  Alert, pleasant, no acute distress, disheveled HEENT:  EOMI, nasal cannula CARDIOVASCULAR:  RRR, no murmurs appreciated RESPIRATORY: Dry cough, poor air movement bilaterally GASTROINTESTINAL:  Soft, nontender, nondistended EXTREMITIES:  No LE edema bilaterally NEURO:  No new focal deficits appreciated SKIN:  No rashes noted PSYCH:  Appropriate mood and affect      Condition at discharge: improving  The results of significant diagnostics from this hospitalization (including imaging, microbiology, ancillary and laboratory) are listed below for reference.   Imaging Studies: CT Angio Chest Pulmonary Embolism (PE) W or WO Contrast Result Date: 09/13/2024 EXAM: CTA CHEST 09/13/2024 04:49:39 AM TECHNIQUE: CTA of the chest was performed without and with the administration of 75 mL of intravenous contrast (iohexol  (OMNIPAQUE ) 350 MG/ML injection 75 mL IOHEXOL  350 MG/ML SOLN). Multiplanar reformatted images are provided for review. MIP images are provided for review. Automated  exposure control, iterative reconstruction, and/or weight based adjustment of the mA/kV was utilized to reduce the radiation dose to as low as reasonably achievable. COMPARISON: CT angiogram of the chest dated 12/04/2023. CLINICAL HISTORY: Pulmonary embolism (PE) suspected, high prob. FINDINGS: PULMONARY ARTERIES: Pulmonary arteries are adequately opacified for evaluation. There is no intraluminal thrombus evident to indicate pulmonary embolus. Main pulmonary artery is normal in caliber. MEDIASTINUM: The heart and pericardium demonstrate no acute abnormality. There is no acute abnormality of the thoracic aorta. LYMPH NODES: No mediastinal, hilar or axillary lymphadenopathy. LUNGS AND PLEURA: There is streaky scarring again demonstrated in the base of the lingula. There is also mild dependent atelectasis within the left upper lobe. There are few ground-glass and reticular opacities also present posteriorly within the right upper lobe. There are patchy, streaky opacities in the posterior periphery of the right lower lobe, associated with mild bronchiectasis. There is mild central lobular emphysema. No evidence of pleural effusion or pneumothorax. UPPER ABDOMEN: There are calcified stones within the gallbladder. There is a simple cyst arising from the superior pole of the left kidney. SOFT TISSUES AND BONES: No acute bone or soft tissue  abnormality. IMPRESSION: 1. No evidence of pulmonary embolism 2. Streaky scarring in the lingular base and mild dependent atelectasis in the left upper lobe 3. Few ground-glass and reticular opacities in the posterior right upper lobe; correlate clinically for atypical/viral pneumonitis versus inflammation 4. Patchy, streaky opacities in the posterior periphery of the right lower lobe with mild bronchiectasis; consider clinical correlation for chronic small airways disease or prior infection 5. Mild centrilobular emphysema; if patient is 63 years old, consider evaluation for  eligibility for low-dose CT lung cancer screening program per guidelines 6. Incidental cholelithiasis 7. 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 Mild patchy bilateral lower lobe opacities, like atelectasis. No pleural effusion. No pneumothorax. HEART AND MEDIASTINUM: No acute abnormality of the cardiac and mediastinal silhouettes. BONES AND SOFT TISSUES: No acute osseous abnormality. IMPRESSION: 1. Mild bilateral lower lobe opacities, likely atelectasis. Electronically signed by: Pinkie Pebbles MD 09/13/2024 03:39 AM EST RP Workstation: [id number]    Microbiology: Results for orders placed or performed during the hospital encounter of 09/13/24  Culture, blood (routine x 2) Call MD if unable to obtain prior to antibiotics being given     Status: None (Preliminary result)   Collection Time: 09/13/24  7:18 AM   Specimen: BLOOD LEFT HAND  Result Value Ref Range Status   Specimen Description   Final    BLOOD LEFT HAND BOTTLES DRAWN AEROBIC AND ANAEROBIC   Special Requests Blood Culture adequate volume  Final   Culture   Final    NO GROWTH 2 DAYS Performed at [facility], [address], [place], [place] [address]    Report Status PENDING  Incomplete  Culture, blood (routine x 2) Call MD if unable to obtain prior to antibiotics being given     Status: None (Preliminary result)   Collection Time: 09/13/24  7:18 AM   Specimen: BLOOD LEFT HAND  Result Value Ref Range Status   Specimen Description   Final    BLOOD LEFT HAND BOTTLES DRAWN AEROBIC AND ANAEROBIC INDEX FINGER   Special Requests Blood Culture adequate  volume  Final   Culture   Final    NO GROWTH 2 DAYS Performed at [facility], [address] [place], [place] [address]    Report Status PENDING  Incomplete    Labs: CBC: Recent Labs  Lab 09/13/24 0324 09/14/24 0456 09/15/24 0445  WBC 7.2 10.7* 10.7*  HGB 11.7* 10.4* 10.7*  HCT 38.8* 33.9* 33.7*  MCV 96.8 93.9 94.7  PLT 214 199 196   Basic Metabolic Panel: Recent Labs  Lab 09/13/24 0324 09/13/24 0340 09/14/24 0456 09/15/24 0445  NA 144  --  139 142  K 4.1  --  4.3 3.5  CL 101  --  99 98  CO2 39*  --  35* 40*  GLUCOSE 112*  --  215* 138*  BUN 15  --  15 18  CREATININE 0.93  --  0.81 0.73  CALCIUM 8.7*  --  8.7* 8.8*  MG  --  1.9  --   --   PHOS  --  3.1  --   --    Liver Function Tests: Recent Labs  Lab  09/14/24 0456  AST 20  ALT 17  ALKPHOS 69  BILITOT 0.2  PROT 6.2*  ALBUMIN 3.8   CBG: No results for input(s): GLUCAP in the last 168 hours.  Discharge time spent: 28 minutes.  Length of inpatient stay: 0 days  Signed: Carliss LELON Canales, DO [facility] 09/15/2024

## 2024-09-15 NOTE — Plan of Care (Signed)
  Problem: Education: Goal: Knowledge of General Education information will improve Description: Including pain rating scale, medication(s)/side effects and non-pharmacologic comfort measures 09/15/2024 0109 by Olene Corean CROME, RN Outcome: Progressing 09/15/2024 0109 by Olene Corean CROME, RN Outcome: Progressing   Problem: Health Behavior/Discharge Planning: Goal: Ability to manage health-related needs will improve 09/15/2024 0109 by Olene Corean CROME, RN Outcome: Progressing 09/15/2024 0109 by Olene Corean CROME, RN Outcome: Progressing   Problem: Clinical Measurements: Goal: Ability to maintain clinical measurements within normal limits will improve 09/15/2024 0109 by Olene Corean CROME, RN Outcome: Progressing 09/15/2024 0109 by Olene Corean CROME, RN Outcome: Progressing Goal: Will remain free from infection 09/15/2024 0109 by Olene Corean CROME, RN Outcome: Progressing 09/15/2024 0109 by Olene Corean CROME, RN Outcome: Progressing Goal: Diagnostic test results will improve 09/15/2024 0109 by Olene Corean CROME, RN Outcome: Progressing 09/15/2024 0109 by Olene Corean CROME, RN Outcome: Progressing Goal: Respiratory complications will improve 09/15/2024 0109 by Olene Corean CROME, RN Outcome: Progressing 09/15/2024 0109 by Olene Corean CROME, RN Outcome: Progressing Goal: Cardiovascular complication will be avoided 09/15/2024 0109 by Olene Corean CROME, RN Outcome: Progressing 09/15/2024 0109 by Olene Corean CROME, RN Outcome: Progressing   Problem: Activity: Goal: Risk for activity intolerance will decrease 09/15/2024 0109 by Olene Corean CROME, RN Outcome: Progressing 09/15/2024 0109 by Olene Corean CROME, RN Outcome: Progressing   Problem: Nutrition: Goal: Adequate nutrition will be maintained 09/15/2024 0109 by Olene Corean CROME, RN Outcome: Progressing 09/15/2024 0109 by Olene Corean CROME, RN Outcome: Progressing   Problem: Coping: Goal: Level of anxiety will decrease 09/15/2024 0109 by Olene Corean CROME, RN Outcome: Progressing 09/15/2024 0109 by Olene Corean CROME, RN Outcome: Progressing   Problem: Elimination: Goal: Will not experience complications related to bowel motility Outcome: Progressing Goal: Will not experience complications related to urinary retention Outcome: Progressing   Problem: Pain Managment: Goal: General experience of comfort will improve and/or be controlled Outcome: Progressing   Problem: Safety: Goal: Ability to remain free from injury will improve Outcome: Progressing   Problem: Skin Integrity: Goal: Risk for impaired skin integrity will decrease Outcome: Progressing   Problem: Activity: Goal: Ability to tolerate increased activity will improve Outcome: Progressing   Problem: Clinical Measurements: Goal: Ability to maintain a body temperature in the normal range will improve Outcome: Progressing   Problem: Respiratory: Goal: Ability to maintain adequate ventilation will improve Outcome: Progressing Goal: Ability to maintain a clear airway will improve Outcome: Progressing

## 2024-09-18 LAB — CULTURE, BLOOD (ROUTINE X 2)
Culture: NO GROWTH
Culture: NO GROWTH
Special Requests: ADEQUATE
Special Requests: ADEQUATE

## 2024-09-25 ENCOUNTER — Encounter (HOSPITAL_COMMUNITY): Payer: Self-pay | Admitting: Emergency Medicine

## 2024-09-25 ENCOUNTER — Emergency Department (HOSPITAL_COMMUNITY)
Admission: EM | Admit: 2024-09-25 | Discharge: 2024-09-25 | Disposition: A | Discharge status: Home / Self Care | Source: Other Acute Inpatient Hospital | Attending: Emergency Medicine | Admitting: Emergency Medicine

## 2024-09-25 ENCOUNTER — Other Ambulatory Visit: Payer: Self-pay

## 2024-09-25 ENCOUNTER — Emergency Department (HOSPITAL_COMMUNITY)

## 2024-09-25 DIAGNOSIS — Z7951 Long term (current) use of inhaled steroids: Secondary | ICD-10-CM | POA: Insufficient documentation

## 2024-09-25 DIAGNOSIS — F17210 Nicotine dependence, cigarettes, uncomplicated: Secondary | ICD-10-CM | POA: Insufficient documentation

## 2024-09-25 DIAGNOSIS — J449 Chronic obstructive pulmonary disease, unspecified: Secondary | ICD-10-CM | POA: Diagnosis not present

## 2024-09-25 DIAGNOSIS — Z79899 Other long term (current) drug therapy: Secondary | ICD-10-CM | POA: Insufficient documentation

## 2024-09-25 DIAGNOSIS — I10 Essential (primary) hypertension: Secondary | ICD-10-CM | POA: Insufficient documentation

## 2024-09-25 DIAGNOSIS — R0789 Other chest pain: Secondary | ICD-10-CM | POA: Diagnosis present

## 2024-09-25 DIAGNOSIS — R079 Chest pain, unspecified: Secondary | ICD-10-CM

## 2024-09-25 LAB — BASIC METABOLIC PANEL WITH GFR
Anion gap: 6 (ref 5–15)
BUN: 16 mg/dL (ref 8–23)
CO2: 39 mmol/L — ABNORMAL HIGH (ref 22–32)
Calcium: 9 mg/dL (ref 8.9–10.3)
Chloride: 97 mmol/L — ABNORMAL LOW (ref 98–111)
Creatinine, Ser: 0.89 mg/dL (ref 0.61–1.24)
GFR, Estimated: >60 mL/min (ref >60)
Glucose, Bld: 137 mg/dL — ABNORMAL HIGH (ref 70–99)
Potassium: 4.5 mmol/L (ref 3.5–5.1)
Sodium: 141 mmol/L (ref 135–145)

## 2024-09-25 LAB — CBC
HCT: 40.2 % (ref 39.0–52.0)
Hemoglobin: 12.3 g/dL — ABNORMAL LOW (ref 13.0–17.0)
MCH: 29.1 pg (ref 26.0–34.0)
MCHC: 30.6 g/dL (ref 30.0–36.0)
MCV: 95 fL (ref 80.0–100.0)
Platelets: 211 K/uL (ref 150–400)
RBC: 4.23 MIL/uL (ref 4.22–5.81)
RDW: 13.1 % (ref 11.5–15.5)
WBC: 11.5 K/uL — ABNORMAL HIGH (ref 4.0–10.5)
nRBC: 0 % (ref 0.0–0.2)

## 2024-09-25 LAB — TROPONIN T, HIGH SENSITIVITY
Troponin T High Sensitivity: 29 ng/L — ABNORMAL HIGH (ref 0–19)
Troponin T High Sensitivity: 26 ng/L — ABNORMAL HIGH (ref 0–19)

## 2024-09-25 NOTE — Discharge Instructions (Signed)
 I have placed a referral to cardiology.  Someone from their office will likely call you in the next few days to arrange follow-up appointment.  I would also like for you to follow-up with your primary care provider.  Please return to the emergency department if you develop any new or worsening symptoms or your chest pain returns

## 2024-09-25 NOTE — ED Triage Notes (Signed)
 Pt bib [facility] from a assistant living called [facility]. Pt had a short episode of chest pain while smoking a cigarette this morning on his 4 L of O2 which he wears at baseline. Pain was resolved before coming to ED.

## 2024-09-25 NOTE — ED Provider Notes (Signed)
 [place] EMERGENCY DEPARTMENT AT [facility] Provider Note   CSN: [id number] Arrival date & time: 09/25/24  9074     Patient presents with: Chest Pain   Miguel Marsh is a 63 y.o. male.    Chest Pain Associated symptoms: no abdominal pain, no cough, no diaphoresis, no dizziness, no fever, no headache, no nausea, no palpitations, no shortness of breath, no vomiting and no weakness        Miguel Marsh is a 63 y.o. male medical history of COPD with chronic respiratory failure requires 4 L continuous O2 at baseline, hypertension, and metastatic NSCLC  presents to the Emergency Department for evaluation of chest pain that began around 830 this morning.  States he was sitting on the porch when the pain began.  He described having a left-sided pressure to his chest that lasted approximately 45 minutes then spontaneously resolved. He is not having any symptoms at time of presentation.  Pain was nonradiating and not associated with any worsening shortness of breath, arm neck or jaw pain diaphoresis or nausea vomiting.  Denies any known injury.   Prior to Admission medications   Medication Sig Start Date End Date Taking? Authorizing Provider  acetaminophen  (TYLENOL ) 325 MG tablet Take 2 tablets (650 mg total) by mouth every 6 (six) hours as needed for mild pain (pain score 1-3) (or Fever >/= 101). 01/31/24   Pearlean Manus, MD  albuterol  (PROVENTIL ) (2.5 MG/3ML) 0.083% nebulizer solution Inhale 3 mLs (2.5 mg total) into the lungs every 6 (six) hours as needed for wheezing or shortness of breath. 01/31/24   Pearlean Manus, MD  albuterol  (VENTOLIN  HFA) 108 (90 Base) MCG/ACT inhaler Inhale 2 puffs into the lungs every 6 (six) hours as needed for shortness of breath or wheezing (Cough). 01/31/24   Pearlean Manus, MD  benzonatate  (TESSALON ) 100 MG capsule Take 1 capsule (100 mg total) by mouth 3 (three) times daily as needed for cough. 09/15/24   Arlon Carliss ORN, DO  BREZTRI   AEROSPHERE 160-9-4.8 MCG/ACT AERO Inhale 2 puffs into the lungs 2 (two) times daily. 01/31/24   Pearlean Manus, MD  clonazePAM  (KLONOPIN ) 1 MG tablet Take 1 tablet (1 mg total) by mouth 2 (two) times daily as needed for anxiety. 11/01/23   Pearlean Manus, MD  furosemide  (LASIX ) 20 MG tablet Take 20 mg by mouth daily. 08/09/24   [provider]  hydrochlorothiazide  (HYDRODIURIL ) 25 MG tablet Take 25 mg by mouth every morning.    [provider]  HYDROcodone -acetaminophen  (NORCO/VICODIN) 5-325 MG tablet Take 1 tablet by mouth 2 (two) times daily as needed. 09/10/24   [provider]  omeprazole  (PRILOSEC) 40 MG capsule Take 1 capsule (40 mg total) by mouth in the morning and at bedtime. 07/15/24   Ricky Fines, MD  OXYGEN  Inhale 4 L into the lungs continuous.    [provider]  prednisoLONE acetate (PRED FORTE) 1 % ophthalmic suspension SMARTSIG:In Eye(s) 09/12/24   [provider]  predniSONE  (DELTASONE ) 10 MG tablet Take 4 tablets (40 mg total) by mouth daily for 3 days, THEN 3 tablets (30 mg total) daily for 3 days, THEN 2 tablets (20 mg total) daily for 3 days, THEN 1 tablet (10 mg total) daily for 3 days. 09/15/24 09/27/24  Arlon Carliss ORN, DO  pregabalin  (LYRICA ) 100 MG capsule Take 100 mg by mouth 2 (two) times daily. 09/08/24   [provider]  Vitamin D, Ergocalciferol, (DRISDOL) 1.25 MG (50000 UNIT) CAPS capsule Take 50,000  Units by mouth once a week. 09/12/24   [provider]    Allergies: Ibuprofen     Review of Systems  Constitutional:  Negative for chills, diaphoresis and fever.  Respiratory:  Negative for cough and shortness of breath.   Cardiovascular:  Positive for chest pain. Negative for palpitations and leg swelling.  Gastrointestinal:  Negative for abdominal pain, diarrhea, nausea and vomiting.  Genitourinary:  Negative for dysuria.  Musculoskeletal:  Negative for neck pain.  Neurological:  Negative for  dizziness, syncope, weakness and headaches.    Updated Vital Signs BP 109/79   Pulse 84   Resp 19   Ht 5' 6 (1.676 m)   Wt 123 kg   SpO2 98%   BMI 43.77 kg/m   Physical Exam Vitals and nursing note reviewed.  Constitutional:      General: He is not in acute distress.    Appearance: He is well-developed. He is not toxic-appearing.  Neck:     Vascular: No JVD.  Cardiovascular:     Rate and Rhythm: Normal rate and regular rhythm.     Pulses: Normal pulses.  Pulmonary:     Effort: Pulmonary effort is normal.     Breath sounds: Normal breath sounds.  Abdominal:     Palpations: Abdomen is soft.     Tenderness: There is no abdominal tenderness.  Musculoskeletal:     Cervical back: Normal range of motion.     Right lower leg: No edema.     Left lower leg: No edema.  Skin:    General: Skin is warm.     Capillary Refill: Capillary refill takes less than 2 seconds.  Neurological:     General: No focal deficit present.     Mental Status: He is alert.     Motor: No weakness.     (all labs ordered are listed, but only abnormal results are displayed) Labs Reviewed  BASIC METABOLIC PANEL WITH GFR - Abnormal; Notable for the following components:      Result Value   Chloride 97 (*)    CO2 39 (*)    Glucose, Bld 137 (*)    All other components within normal limits  CBC - Abnormal; Notable for the following components:   WBC 11.5 (*)    Hemoglobin 12.3 (*)    All other components within normal limits  TROPONIN T, HIGH SENSITIVITY - Abnormal; Notable for the following components:   Troponin T High Sensitivity 29 (*)    All other components within normal limits    EKG: EKG Interpretation Date/Time:  Thursday September 25 2024 09:42:10 EST Ventricular Rate:  84 PR Interval:  155 QRS Duration:  86 QT Interval:  366 QTC Calculation: 433 R Axis:   70  Text Interpretation: Sinus rhythm Abnormal R-wave progression, late transition Minimal ST elevation, inferior leads  Confirmed by Suzette Pac [phone]) on 09/25/2024 12:50:30 PM  Radiology: [facility] Chest 2 View Result Date: 09/25/2024 CLINICAL DATA:  Chest pain beginning this morning. EXAM: CHEST - 2 VIEW COMPARISON:  09/13/2024. FINDINGS: Cardiac silhouette top-normal in size. No mediastinal or hilar masses. No convincing adenopathy. Discoid type opacity noted in the left upper lobe lingula consistent with atelectasis/scarring, similar to the prior exam. Subtle patchy opacity noted in the right upper lobe appears new since the prior study. Remainder of the lungs is clear. No pleural effusion or pneumothorax. Skeletal structures are grossly intact. IMPRESSION: 1. Subtle patchy opacity in the right upper lobe. Consider infection if there are consistent  clinical findings. This could alternatively reflect atelectasis. 2. No other evidence of acute cardiopulmonary disease. Electronically Signed   By: Alm Parkins M.D.   On: 09/25/2024 10:07     Procedures   Medications Ordered in the ED - No data to display                                  Medical Decision Making   Patient brought in by EMS from assisted living facility for evaluation of episode of chest pain this morning that began around 830 AM.  Lasted approximately 45 minutes and spontaneously resolved.  Describes chest pain as left-sided and pressure-like.  Pain nonradiating, not associated with shortness of breath diaphoresis, arm pain, jaw pain, nausea vomiting.  No known injury  On my exam, patient states pain has completely resolved and he is requesting discharge home.  He does have elevated heart score with risk factors, so we will proceed with labs, x-ray and EKG with delta troponin  Last echo May 2024 showed LVEF 60 to 65%  Chest pain differential would include ACS, MSK, PE, pneumonia, pericarditis  Amount and/or Complexity of Data Reviewed Labs: ordered.    Details: No significant leukocytosis, first troponin elevated at 29.  He has chronically  elevated troponins.  Chemistry without significant derangement Radiology: ordered.    Details: Chest x-ray shows patchy opacity in the right upper lobe consider infection versus atelectasis ECG/medicine tests: ordered.    Details: EKG shows sinus rhythm abnormal R wave progression late transition with minimal ST elevation inferior leads Discussion of management or test interpretation with external provider(s):   On recheck, patient resting comfortably.  Continues to remain pain-free.  Requesting discharge home.  Offered hospital admission, but prefers to follow up out pt with cardiology.  Delta troponin trending downward.  Opacity seen on chest x-ray likely atelectasis as patient denies cough, fever, chills, or shortness of breath that is worsening from his baseline and he does not have a significant leukocytosis.  he does have elevated heart score, I will place ambulatory referral to cards.  I have given strict ER return precautions.            Final diagnoses:  Nonspecific chest pain    ED Discharge Orders     None          Herlinda Milling, PA-C 09/25/24 1353    Suzette Pac, MD 09/26/24 1555

## 2024-09-25 NOTE — ED Notes (Signed)
 Edp at bedside

## 2024-10-23 ENCOUNTER — Emergency Department (HOSPITAL_COMMUNITY)

## 2024-10-23 ENCOUNTER — Other Ambulatory Visit: Payer: Self-pay

## 2024-10-23 ENCOUNTER — Inpatient Hospital Stay (HOSPITAL_COMMUNITY)
Admission: EM | Admit: 2024-10-23 | Discharge: 2024-10-27 | DRG: 291 | Disposition: A | Discharge status: Home / Self Care | Attending: Family Medicine | Admitting: Family Medicine

## 2024-10-23 ENCOUNTER — Encounter (HOSPITAL_COMMUNITY): Payer: Self-pay

## 2024-10-23 DIAGNOSIS — Z9981 Dependence on supplemental oxygen: Secondary | ICD-10-CM

## 2024-10-23 DIAGNOSIS — I10 Essential (primary) hypertension: Secondary | ICD-10-CM | POA: Diagnosis present

## 2024-10-23 DIAGNOSIS — J441 Chronic obstructive pulmonary disease with (acute) exacerbation: Principal | ICD-10-CM | POA: Diagnosis present

## 2024-10-23 DIAGNOSIS — F1490 Cocaine use, unspecified, uncomplicated: Secondary | ICD-10-CM | POA: Diagnosis present

## 2024-10-23 DIAGNOSIS — Z923 Personal history of irradiation: Secondary | ICD-10-CM

## 2024-10-23 DIAGNOSIS — J9622 Acute and chronic respiratory failure with hypercapnia: Secondary | ICD-10-CM | POA: Diagnosis present

## 2024-10-23 DIAGNOSIS — Z96649 Presence of unspecified artificial hip joint: Secondary | ICD-10-CM | POA: Diagnosis present

## 2024-10-23 DIAGNOSIS — Z87891 Personal history of nicotine dependence: Secondary | ICD-10-CM | POA: Diagnosis not present

## 2024-10-23 DIAGNOSIS — Z79899 Other long term (current) drug therapy: Secondary | ICD-10-CM

## 2024-10-23 DIAGNOSIS — Z886 Allergy status to analgesic agent status: Secondary | ICD-10-CM | POA: Diagnosis not present

## 2024-10-23 DIAGNOSIS — E8729 Other acidosis: Secondary | ICD-10-CM | POA: Diagnosis present

## 2024-10-23 DIAGNOSIS — R0689 Other abnormalities of breathing: Secondary | ICD-10-CM

## 2024-10-23 DIAGNOSIS — Z8249 Family history of ischemic heart disease and other diseases of the circulatory system: Secondary | ICD-10-CM | POA: Diagnosis not present

## 2024-10-23 DIAGNOSIS — Z1152 Encounter for screening for COVID-19: Secondary | ICD-10-CM

## 2024-10-23 DIAGNOSIS — J9621 Acute and chronic respiratory failure with hypoxia: Secondary | ICD-10-CM | POA: Diagnosis present

## 2024-10-23 DIAGNOSIS — J449 Chronic obstructive pulmonary disease, unspecified: Secondary | ICD-10-CM | POA: Diagnosis present

## 2024-10-23 DIAGNOSIS — E66813 Obesity, class 3: Secondary | ICD-10-CM | POA: Diagnosis present

## 2024-10-23 DIAGNOSIS — I110 Hypertensive heart disease with heart failure: Principal | ICD-10-CM | POA: Diagnosis present

## 2024-10-23 DIAGNOSIS — J9601 Acute respiratory failure with hypoxia: Secondary | ICD-10-CM | POA: Diagnosis not present

## 2024-10-23 DIAGNOSIS — Z85118 Personal history of other malignant neoplasm of bronchus and lung: Secondary | ICD-10-CM

## 2024-10-23 DIAGNOSIS — I5033 Acute on chronic diastolic (congestive) heart failure: Secondary | ICD-10-CM | POA: Diagnosis present

## 2024-10-23 DIAGNOSIS — F419 Anxiety disorder, unspecified: Secondary | ICD-10-CM | POA: Diagnosis present

## 2024-10-23 LAB — COMPREHENSIVE METABOLIC PANEL WITH GFR
ALT: 12 U/L (ref 0–44)
AST: 21 U/L (ref 15–41)
Albumin: 3.9 g/dL (ref 3.5–5.0)
Alkaline Phosphatase: 82 U/L (ref 38–126)
BUN: 10 mg/dL (ref 8–23)
CO2: >45 mmol/L — ABNORMAL HIGH (ref 22–32)
Calcium: 9.1 mg/dL (ref 8.9–10.3)
Chloride: 97 mmol/L — ABNORMAL LOW (ref 98–111)
Creatinine, Ser: 0.91 mg/dL (ref 0.61–1.24)
GFR, Estimated: >60 mL/min
Glucose, Bld: 102 mg/dL — ABNORMAL HIGH (ref 70–99)
Potassium: 4.4 mmol/L (ref 3.5–5.1)
Sodium: 144 mmol/L (ref 135–145)
Total Bilirubin: 0.3 mg/dL (ref 0.0–1.2)
Total Protein: 6.7 g/dL (ref 6.5–8.1)

## 2024-10-23 LAB — BLOOD GAS, VENOUS
Acid-Base Excess: 20.1 mmol/L — ABNORMAL HIGH (ref 0.0–2.0)
Bicarbonate: 51.3 mmol/L — ABNORMAL HIGH (ref 20.0–28.0)
Drawn by: 442
O2 Saturation: 88.9 %
Patient temperature: 36.8
pCO2, Ven: 94 mmHg (ref 44–60)
pH, Ven: 7.34 (ref 7.25–7.43)
pO2, Ven: 51 mmHg — ABNORMAL HIGH (ref 32–45)

## 2024-10-23 LAB — PRO BRAIN NATRIURETIC PEPTIDE: Pro Brain Natriuretic Peptide: 69 pg/mL

## 2024-10-23 LAB — CBC WITH DIFFERENTIAL/PLATELET
Abs Immature Granulocytes: 0.04 K/uL (ref 0.00–0.07)
Basophils Absolute: 0 K/uL (ref 0.0–0.1)
Basophils Relative: 0 %
Eosinophils Absolute: 0.1 K/uL (ref 0.0–0.5)
Eosinophils Relative: 2 %
HCT: 37.8 % — ABNORMAL LOW (ref 39.0–52.0)
Hemoglobin: 11.3 g/dL — ABNORMAL LOW (ref 13.0–17.0)
Immature Granulocytes: 1 %
Lymphocytes Relative: 26 %
Lymphs Abs: 1.8 K/uL (ref 0.7–4.0)
MCH: 29 pg (ref 26.0–34.0)
MCHC: 29.9 g/dL — ABNORMAL LOW (ref 30.0–36.0)
MCV: 97.2 fL (ref 80.0–100.0)
Monocytes Absolute: 0.9 K/uL (ref 0.1–1.0)
Monocytes Relative: 14 %
Neutro Abs: 3.9 K/uL (ref 1.7–7.7)
Neutrophils Relative %: 57 %
Platelets: 215 K/uL (ref 150–400)
RBC: 3.89 MIL/uL — ABNORMAL LOW (ref 4.22–5.81)
RDW: 12.1 % (ref 11.5–15.5)
WBC: 6.8 K/uL (ref 4.0–10.5)
nRBC: 0 % (ref 0.0–0.2)

## 2024-10-23 LAB — GLUCOSE, CAPILLARY: Glucose-Capillary: 219 mg/dL — ABNORMAL HIGH (ref 70–99)

## 2024-10-23 LAB — TROPONIN T, HIGH SENSITIVITY
Troponin T High Sensitivity: 24 ng/L — ABNORMAL HIGH (ref 0–19)
Troponin T High Sensitivity: 18 ng/L (ref 0–19)

## 2024-10-23 MED ORDER — ACETAMINOPHEN 325 MG PO TABS
650.0000 mg | ORAL_TABLET | Freq: Four times a day (QID) | ORAL | Status: DC | PRN
  Filled 2024-10-23: qty 2

## 2024-10-23 MED ORDER — INSULIN ASPART 100 UNIT/ML IJ SOLN
0.0000 [IU] | Freq: Every day | INTRAMUSCULAR | Status: DC
  Administered 2024-10-23: 2 [IU] via SUBCUTANEOUS
  Filled 2024-10-23: qty 1

## 2024-10-23 MED ORDER — ONDANSETRON HCL 4 MG/2ML IJ SOLN: 4.0000 mg | Freq: Four times a day (QID) | INTRAMUSCULAR | Status: DC | PRN

## 2024-10-23 MED ORDER — AZITHROMYCIN 250 MG PO TABS
500.0000 mg | ORAL_TABLET | Freq: Every day | ORAL | Status: AC
  Administered 2024-10-24 – 2024-10-25 (×2): 500 mg via ORAL
  Filled 2024-10-23 (×2): qty 2

## 2024-10-23 MED ORDER — METHYLPREDNISOLONE SODIUM SUCC 125 MG IJ SOLR
60.0000 mg | Freq: Two times a day (BID) | INTRAMUSCULAR | Status: AC
  Administered 2024-10-24 (×2): 60 mg via INTRAVENOUS
  Filled 2024-10-23 (×2): qty 2

## 2024-10-23 MED ORDER — INSULIN ASPART 100 UNIT/ML IJ SOLN
0.0000 [IU] | Freq: Three times a day (TID) | INTRAMUSCULAR | Status: DC
  Administered 2024-10-24 – 2024-10-25 (×2): 1 [IU] via SUBCUTANEOUS
  Administered 2024-10-25 – 2024-10-26 (×2): 2 [IU] via SUBCUTANEOUS
  Administered 2024-10-26: 3 [IU] via SUBCUTANEOUS
  Administered 2024-10-27: 1 [IU] via SUBCUTANEOUS
  Administered 2024-10-27: 2 [IU] via SUBCUTANEOUS
  Filled 2024-10-23 (×5): qty 1

## 2024-10-23 MED ORDER — ENOXAPARIN SODIUM 60 MG/0.6ML IJ SOSY
60.0000 mg | PREFILLED_SYRINGE | INTRAMUSCULAR | Status: DC
  Administered 2024-10-23 – 2024-10-26 (×4): 60 mg via SUBCUTANEOUS
  Filled 2024-10-23 (×4): qty 0.6

## 2024-10-23 MED ORDER — ACETAMINOPHEN 650 MG RE SUPP: 650.0000 mg | Freq: Four times a day (QID) | RECTAL | Status: DC | PRN

## 2024-10-23 MED ORDER — HYDROCODONE-ACETAMINOPHEN 5-325 MG PO TABS
1.0000 | ORAL_TABLET | Freq: Two times a day (BID) | ORAL | Status: DC | PRN
  Administered 2024-10-23: 1 via ORAL
  Filled 2024-10-23 (×2): qty 1

## 2024-10-23 MED ORDER — PREGABALIN 50 MG PO CAPS
100.0000 mg | ORAL_CAPSULE | Freq: Two times a day (BID) | ORAL | Status: DC
  Administered 2024-10-23 – 2024-10-27 (×8): 100 mg via ORAL
  Filled 2024-10-23 (×3): qty 2
  Filled 2024-10-23: qty 4
  Filled 2024-10-23 (×3): qty 2

## 2024-10-23 MED ORDER — IPRATROPIUM-ALBUTEROL 0.5-2.5 (3) MG/3ML IN SOLN
3.0000 mL | RESPIRATORY_TRACT | Status: DC | PRN
  Filled 2024-10-23: qty 3

## 2024-10-23 MED ORDER — SODIUM CHLORIDE 0.9 % IV BOLUS
1000.0000 mL | Freq: Once | INTRAVENOUS | Status: AC
  Administered 2024-10-23: 1000 mL via INTRAVENOUS

## 2024-10-23 MED ORDER — IPRATROPIUM-ALBUTEROL 0.5-2.5 (3) MG/3ML IN SOLN
3.0000 mL | Freq: Three times a day (TID) | RESPIRATORY_TRACT | Status: DC
  Administered 2024-10-24 – 2024-10-27 (×11): 3 mL via RESPIRATORY_TRACT
  Filled 2024-10-23 (×9): qty 3

## 2024-10-23 MED ORDER — IPRATROPIUM-ALBUTEROL 0.5-2.5 (3) MG/3ML IN SOLN
3.0000 mL | Freq: Once | RESPIRATORY_TRACT | Status: AC
  Administered 2024-10-23: 3 mL via RESPIRATORY_TRACT
  Filled 2024-10-23: qty 3

## 2024-10-23 MED ORDER — POLYETHYLENE GLYCOL 3350 17 G PO PACK: 17.0000 g | PACK | Freq: Every day | ORAL | Status: DC | PRN

## 2024-10-23 MED ORDER — IPRATROPIUM-ALBUTEROL 0.5-2.5 (3) MG/3ML IN SOLN
3.0000 mL | Freq: Three times a day (TID) | RESPIRATORY_TRACT | Status: DC
  Administered 2024-10-23: 3 mL via RESPIRATORY_TRACT
  Filled 2024-10-23: qty 3

## 2024-10-23 MED ORDER — SODIUM CHLORIDE 0.9 % IV SOLN
500.0000 mg | INTRAVENOUS | Status: AC
  Administered 2024-10-23: 500 mg via INTRAVENOUS
  Filled 2024-10-23: qty 5

## 2024-10-23 MED ORDER — PREDNISONE 20 MG PO TABS
40.0000 mg | ORAL_TABLET | Freq: Every day | ORAL | Status: DC
  Administered 2024-10-25: 40 mg via ORAL
  Filled 2024-10-23 (×2): qty 2

## 2024-10-23 MED ORDER — FUROSEMIDE 10 MG/ML IJ SOLN
40.0000 mg | Freq: Two times a day (BID) | INTRAMUSCULAR | Status: DC
  Administered 2024-10-23 – 2024-10-27 (×8): 40 mg via INTRAVENOUS
  Filled 2024-10-23 (×7): qty 4

## 2024-10-23 MED ORDER — ONDANSETRON HCL 4 MG PO TABS: 4.0000 mg | ORAL_TABLET | Freq: Four times a day (QID) | ORAL | Status: DC | PRN

## 2024-10-23 MED ORDER — GUAIFENESIN-DM 100-10 MG/5ML PO SYRP
15.0000 mL | ORAL_SOLUTION | Freq: Three times a day (TID) | ORAL | Status: AC
  Administered 2024-10-23 – 2024-10-24 (×3): 15 mL via ORAL
  Filled 2024-10-23 (×3): qty 15

## 2024-10-23 NOTE — ED Notes (Signed)
 Pt ambulated with walker and RN at this time.  Pt ambulated on 4L [place], pt 02 stats dropped to 85% while ambulating and HR went to 106.  Provider made aware.

## 2024-10-23 NOTE — Consult Note (Signed)
 PHARMACIST - PHYSICIAN COMMUNICATION  CONCERNING:  Enoxaparin  (Lovenox ) for DVT Prophylaxis    RECOMMENDATION: Patient was prescribed enoxaprin 40mg  q24 hours for VTE prophylaxis.   Filed Weights   10/23/24 1154  Weight: 123 kg (271 lb 2.7 oz)    Body mass index is 43.77 kg/m.  Estimated Creatinine Clearance: 102.8 mL/min (by C-G formula based on SCr of 0.91 mg/dL).   Based on [facility] policy patient is candidate for enoxaparin  0.5mg /kg TBW SQ every 24 hours based on BMI being >30.   DESCRIPTION: Pharmacy has adjusted enoxaparin  dose per [facility] policy.  Patient is now receiving enoxaparin  60 mg every 24 hours    Annabella LOISE Banks, PharmD Clinical Pharmacist  10/23/2024 9:06 PM

## 2024-10-23 NOTE — ED Provider Notes (Signed)
 " [place] EMERGENCY DEPARTMENT AT [facility] Provider Note   CSN: [id number] Arrival date & time: 10/23/24  1148     Patient presents with: Shortness of Breath   Miguel Marsh is a 63 y.o. male with history of COPD on 4 L nasal cannula baseline, hypertension, metastatic NSCLC s/p radiation reportedly in remission presents with shortness of breath over the past few days.  Denies any chest pain.  No significant cough or URI symptoms.  Brought in from group home from EMS received 1 DuoNeb treatment and 125 mg Solu-Medrol  and route.  Reports that he had some improvement with this.    Shortness of Breath     Past Medical History:  Diagnosis Date   Arthritis    Bronchitis    Cancer (HCC)    metastatic NSCLC (followed by [facility])   COPD (chronic obstructive pulmonary disease) (HCC)    HTN (hypertension)    Past Surgical History:  Procedure Laterality Date   LUNG BIOPSY     TOTAL HIP ARTHROPLASTY     TUMOR REMOVAL Right 12/04/2017     Prior to Admission medications  Medication Sig Start Date End Date Taking? Authorizing Provider  acetaminophen  (TYLENOL ) 325 MG tablet Take 2 tablets (650 mg total) by mouth every 6 (six) hours as needed for mild pain (pain score 1-3) (or Fever >/= 101). 01/31/24   Pearlean Manus, MD  albuterol  (PROVENTIL ) (2.5 MG/3ML) 0.083% nebulizer solution Inhale 3 mLs (2.5 mg total) into the lungs every 6 (six) hours as needed for wheezing or shortness of breath. 01/31/24   Pearlean Manus, MD  albuterol  (VENTOLIN  HFA) 108 (90 Base) MCG/ACT inhaler Inhale 2 puffs into the lungs every 6 (six) hours as needed for shortness of breath or wheezing (Cough). 01/31/24   Pearlean Manus, MD  benzonatate  (TESSALON ) 100 MG capsule Take 1 capsule (100 mg total) by mouth 3 (three) times daily as needed for cough. 09/15/24   Arlon Carliss ORN, DO  BREZTRI  AEROSPHERE 160-9-4.8 MCG/ACT AERO Inhale 2 puffs into the lungs 2 (two) times daily. 01/31/24   Pearlean Manus, MD   clonazePAM  (KLONOPIN ) 1 MG tablet Take 1 tablet (1 mg total) by mouth 2 (two) times daily as needed for anxiety. 11/01/23   Pearlean Manus, MD  furosemide  (LASIX ) 20 MG tablet Take 20 mg by mouth daily. 08/09/24   [provider]  hydrochlorothiazide  (HYDRODIURIL ) 25 MG tablet Take 25 mg by mouth every morning.    [provider]  HYDROcodone -acetaminophen  (NORCO/VICODIN) 5-325 MG tablet Take 1 tablet by mouth 2 (two) times daily as needed. 09/10/24   [provider]  omeprazole  (PRILOSEC) 40 MG capsule Take 1 capsule (40 mg total) by mouth in the morning and at bedtime. 07/15/24   Ricky Fines, MD  OXYGEN  Inhale 4 L into the lungs continuous.    [provider]  prednisoLONE acetate (PRED FORTE) 1 % ophthalmic suspension SMARTSIG:In Eye(s) 09/12/24   [provider]  pregabalin  (LYRICA ) 100 MG capsule Take 100 mg by mouth 2 (two) times daily. 09/08/24   [provider]  Vitamin D, Ergocalciferol, (DRISDOL) 1.25 MG (50000 UNIT) CAPS capsule Take 50,000 Units by mouth once a week. 09/12/24   [provider]    Allergies: Ibuprofen     Review of Systems  Respiratory:  Positive for shortness of breath.     Updated Vital Signs BP 119/74   Pulse 81   Temp 98.2 F (36.8 C) (Oral)   Resp 18  Ht 5' 6 (1.676 m)   Wt 123 kg   SpO2 98%   BMI 43.77 kg/m   Physical Exam Vitals and nursing note reviewed.  Constitutional:      General: He is not in acute distress.    Appearance: He is well-developed.  HENT:     Head: Normocephalic and atraumatic.  Eyes:     Conjunctiva/sclera: Conjunctivae normal.  Cardiovascular:     Rate and Rhythm: Normal rate and regular rhythm.     Heart sounds: No murmur heard. Pulmonary:     Effort: Pulmonary effort is normal. No respiratory distress.     Breath sounds: Normal breath sounds.     Comments: Baseline 4 L nasal cannula, diminished lung sounds without any obvious wheezes or  rales Abdominal:     Palpations: Abdomen is soft.     Tenderness: There is no abdominal tenderness.  Musculoskeletal:        General: No swelling.     Cervical back: Neck supple.  Skin:    General: Skin is warm and dry.     Capillary Refill: Capillary refill takes less than 2 seconds.  Neurological:     Mental Status: He is alert.  Psychiatric:        Mood and Affect: Mood normal.     (all labs ordered are listed, but only abnormal results are displayed) Labs Reviewed  CBC WITH DIFFERENTIAL/PLATELET - Abnormal; Notable for the following components:      Result Value   RBC 3.89 (*)    Hemoglobin 11.3 (*)    HCT 37.8 (*)    MCHC 29.9 (*)    All other components within normal limits  COMPREHENSIVE METABOLIC PANEL WITH GFR - Abnormal; Notable for the following components:   Chloride 97 (*)    CO2 >45 (*)    Glucose, Bld 102 (*)    All other components within normal limits  BLOOD GAS, VENOUS - Abnormal; Notable for the following components:   pCO2, Ven 94 (*)    pO2, Ven 51 (*)    Bicarbonate 51.3 (*)    Acid-Base Excess 20.1 (*)    All other components within normal limits  TROPONIN T, HIGH SENSITIVITY - Abnormal; Notable for the following components:   Troponin T High Sensitivity 24 (*)    All other components within normal limits  PRO BRAIN NATRIURETIC PEPTIDE  TROPONIN T, HIGH SENSITIVITY    EKG: None  Radiology: DG Chest Portable 1 View Result Date: 10/23/2024 CLINICAL DATA:  Shortness of breath. EXAM: PORTABLE CHEST 1 VIEW COMPARISON:  [place] 05/18/2024 FINDINGS: The cardio pericardial silhouette is enlarged. There is pulmonary vascular congestion without overt pulmonary edema. Streaky density in the parahilar left lung is stable with evidence of retrocardiac left base collapse/consolidation. Small left effusion not excluded. Telemetry leads overlie the chest. IMPRESSION: 1. Enlargement of the cardiopericardial silhouette with pulmonary vascular congestion. 2.  Retrocardiac left base collapse/consolidation with possible small left effusion. Electronically Signed   By: Camellia Candle M.D.   On: 10/23/2024 12:39     Procedures   Medications Ordered in the ED  ipratropium-albuterol  (DUONEB) 0.5-2.5 (3) MG/3ML nebulizer solution 3 mL (3 mLs Nebulization Given 10/23/24 1318)  sodium chloride  0.9 % bolus 1,000 mL (1,000 mLs Intravenous New Bag/Given 10/23/24 1700)    Clinical Course as of 10/23/24 1829  Thu Oct 23, 2024  1216 Patient with history of COPD on 4 L baseline, lung cancer in remission presents with complaints of shortness of breath  over the past few days.  Symptoms are exertional and not associate with any chest pain or URI symptoms.  He is hemodynamically stable and nontoxic-appearing upon arrival.  He is on his baseline oxygen  requirement.  He has diminished lung sounds without any obvious wheezing or rales.  Will provide another trial of DuoNeb and obtain routine workup.  Does appear that he had a PE study within the past month without any evidence of PE.  Will hold off on repeating PE scan today. [JT]  1306 CBC with Differential(!) No leukocytosis, hemoglobin stable [JT]  1308 Comprehensive metabolic panel(!) CO2 elevated near baseline, will obtain VBG [JT]  1308 Troponin T, High Sensitivity(!) Elevated at 24, near baseline, will trend [JT]  1309 DG Chest Portable 1 View Enlarged cardiac silhouette with pulmonary vascular congestion [JT]  1336 Pro Brain natriuretic peptide Without elevation [JT]  1414 Blood gas, venous (at [facility] and [facility])(!!) Notable for pCO2 of 94, started on BiPAP.  Does appear that he has a history of hypercapnic respiratory failure requiring admission for this in the past.  Will ambulate him on his baseline oxygen  as well to assess for acuity [JT]  1528 Upon reevaluation patient reports marked improvement of symptoms following [JT]  1644 Unsuccessful ambulation test.  Will pursue admission [JT]  1738 Discussed patient  with Dr. Pearlean, agreed for admission [JT]    Clinical Course User Index [JT] Donnajean Lynwood DEL, PA-C                                 Medical Decision Making  This patient presents to the ED with chief complaint(s) of SOB .  The complaint involves an extensive differential diagnosis and also carries with it a high risk of complications and morbidity.   Pertinent past medical history as listed in HPI  The differential diagnosis includes  CHF, COPD, pneumonia, ACS, PE Additional history obtained: Additional history obtained from EMS  Records reviewed Care Everywhere/External Records  Disposition:   Patient admitted for further management  Social Determinants of Health:   none  This note was dictated with voice recognition software.  Despite best efforts at proofreading, errors may have occurred which can change the documentation meaning.       Final diagnoses:  COPD exacerbation (HCC)  Hypercapnia    ED Discharge Orders     None          Donnajean Lynwood DEL DEVONNA 10/23/24 1829    Ula Prentice SAUNDERS, MD 10/31/24 [phone]  "

## 2024-10-23 NOTE — ED Triage Notes (Signed)
 Pt arrived via [facility] from [facility] for concerns of worsening SOB X 2-3 days. Pt presents on 4L O2 Nasal Cannula at baseline. Per EMS, they administered 1 DuoNeb Treatment and 125mg  SoluMedrol PTA.

## 2024-10-23 NOTE — H&P (Signed)
 " History and Physical    Miguel Marsh FMW:[id number] DOB: April 15, 1961 DOA: 10/23/2024  PCP: Renato Dorothey HERO, NP   Patient coming from: Home  I have personally briefly reviewed patient's old medical records in [facility]  Chief Complaint: Difficulty breathing  HPI: Miguel Marsh is a 63 y.o. male with medical history significant for COPD, hypertension, chronic respiratory failure on 4 L, cocaine use. Patient presented to the ED with complaints of difficulty breathing of 2 to 3 days duration, also cough productive of greenish phlegm this is about 2 to 3 weeks ago.  No chest pain.  He reports lower extremity swelling and abdominal distention.  Reports compliance with Lasix  20 mg daily.  ED Course: Temperature 98.3.  Heart rate 81-100.  Respiratory rate 16-24.  Blood pressure systolic 100-139.  O2 sats 97% on 4 L, with ambulation O2 sats dropped to 85%. Chest x-ray shows pulmonary vascular congestion. proBNP 69. Troponin 24 >> 18. VBG shows pH of 7.34, mild elevated pCO2 of 94.  Serum bicarb greater than 45. BiPAP initially ordered in ED due to elevated pCO2. 1 L bolus given.  DuoNeb given. Hospitalist to admit for acute hypoxic respiratory failure.  Review of Systems: As per HPI all other systems reviewed and negative.  Past Medical History:  Diagnosis Date   Arthritis    Bronchitis    Cancer (HCC)    metastatic NSCLC (followed by [facility])   COPD (chronic obstructive pulmonary disease) (HCC)    HTN (hypertension)     Past Surgical History:  Procedure Laterality Date   LUNG BIOPSY     TOTAL HIP ARTHROPLASTY     TUMOR REMOVAL Right 12/04/2017     reports that he has been smoking cigarettes. He has been exposed to tobacco smoke. He has never used smokeless tobacco. He reports that he does not currently use alcohol . He reports that he does not currently use drugs after having used the following drugs: Marijuana and Cocaine.  Allergies[1]  Family history of  hypertension.  Prior to Admission medications  Medication Sig Start Date End Date Taking? Authorizing Provider  acetaminophen  (TYLENOL ) 325 MG tablet Take 2 tablets (650 mg total) by mouth every 6 (six) hours as needed for mild pain (pain score 1-3) (or Fever >/= 101). 01/31/24   Pearlean Manus, MD  albuterol  (PROVENTIL ) (2.5 MG/3ML) 0.083% nebulizer solution Inhale 3 mLs (2.5 mg total) into the lungs every 6 (six) hours as needed for wheezing or shortness of breath. 01/31/24   Pearlean Manus, MD  albuterol  (VENTOLIN  HFA) 108 (90 Base) MCG/ACT inhaler Inhale 2 puffs into the lungs every 6 (six) hours as needed for shortness of breath or wheezing (Cough). 01/31/24   Pearlean Manus, MD  benzonatate  (TESSALON ) 100 MG capsule Take 1 capsule (100 mg total) by mouth 3 (three) times daily as needed for cough. 09/15/24   Arlon Carliss ORN, DO  BREZTRI  AEROSPHERE 160-9-4.8 MCG/ACT AERO Inhale 2 puffs into the lungs 2 (two) times daily. 01/31/24   Pearlean Manus, MD  clonazePAM  (KLONOPIN ) 1 MG tablet Take 1 tablet (1 mg total) by mouth 2 (two) times daily as needed for anxiety. 11/01/23   Pearlean Manus, MD  furosemide  (LASIX ) 20 MG tablet Take 20 mg by mouth daily. 08/09/24   [provider]  hydrochlorothiazide  (HYDRODIURIL ) 25 MG tablet Take 25 mg by mouth every morning.    [provider]  HYDROcodone -acetaminophen  (NORCO/VICODIN) 5-325 MG tablet Take 1 tablet by mouth 2 (two) times daily as  needed. 09/10/24   [provider]  omeprazole  (PRILOSEC) 40 MG capsule Take 1 capsule (40 mg total) by mouth in the morning and at bedtime. 07/15/24   Ricky Fines, MD  OXYGEN  Inhale 4 L into the lungs continuous.    [provider]  prednisoLONE acetate (PRED FORTE) 1 % ophthalmic suspension SMARTSIG:In Eye(s) 09/12/24   [provider]  pregabalin  (LYRICA ) 100 MG capsule Take 100 mg by mouth 2 (two) times daily. 09/08/24   [provider]  Vitamin D,  Ergocalciferol, (DRISDOL) 1.25 MG (50000 UNIT) CAPS capsule Take 50,000 Units by mouth once a week. 09/12/24   [provider]    Physical Exam: Vitals:   10/23/24 1500 10/23/24 1600 10/23/24 1650 10/23/24 1700  BP:  100/60 139/84 119/74  Pulse: 92 81 81 81  Resp: (!) 21   18  Temp:   98.2 F (36.8 C)   TempSrc:   Oral   SpO2: 94% 97% 98% 98%  Weight:      Height:        Constitutional: NAD, calm, comfortable Vitals:   10/23/24 1500 10/23/24 1600 10/23/24 1650 10/23/24 1700  BP:  100/60 139/84 119/74  Pulse: 92 81 81 81  Resp: (!) 21   18  Temp:   98.2 F (36.8 C)   TempSrc:   Oral   SpO2: 94% 97% 98% 98%  Weight:      Height:       Eyes: PERRL, lids and conjunctivae normal ENMT: Mucous membranes are moist.   Neck: normal, supple, no masses, no thyromegaly Respiratory: Diffuse expiratory wheezing,  no crackles. Normal respiratory effort. No accessory muscle use.  Cardiovascular: Regular rate and rhythm, no murmurs / rubs / gallops. No extremity edema. Extremities warm. Abdomen: Abdomen distended, firm but not hard, no tenderness, no masses palpated. No hepatosplenomegaly. .  Musculoskeletal: no clubbing / cyanosis. No joint deformity upper and lower extremities.  Skin: no rashes, lesions, ulcers. No induration Neurologic: No facial asymmetry, moving extremity spontaneously, speech fluent.  Psychiatric: Normal judgment and insight. Alert and oriented x 3. Normal mood.   Labs on Admission: I have personally reviewed following labs and imaging studies  CBC: Recent Labs  Lab 10/23/24 1206  WBC 6.8  NEUTROABS 3.9  HGB 11.3*  HCT 37.8*  MCV 97.2  PLT 215   Basic Metabolic Panel: Recent Labs  Lab 10/23/24 1206  NA 144  K 4.4  CL 97*  CO2 >45*  GLUCOSE 102*  BUN 10  CREATININE 0.91  CALCIUM 9.1   GFR: Estimated Creatinine Clearance: 102.8 mL/min (by C-G formula based on SCr of 0.91 mg/dL). Liver Function Tests: Recent Labs  Lab 10/23/24 1206   AST 21  ALT 12  ALKPHOS 82  BILITOT 0.3  PROT 6.7  ALBUMIN 3.9   BNP (last 3 results) Recent Labs    08/03/24 1033 09/13/24 0324 10/23/24 1206  PROBNP <50.0 69.3 69.0   Urine analysis:    Component Value Date/Time   COLORURINE YELLOW 08/03/2024 1038   APPEARANCEUR CLEAR 08/03/2024 1038   LABSPEC 1.018 08/03/2024 1038   PHURINE 6.0 08/03/2024 1038   GLUCOSEU NEGATIVE 08/03/2024 1038   HGBUR NEGATIVE 08/03/2024 1038   BILIRUBINUR NEGATIVE 08/03/2024 1038   KETONESUR NEGATIVE 08/03/2024 1038   PROTEINUR NEGATIVE 08/03/2024 1038   UROBILINOGEN 0.2 10/21/2013 0200   NITRITE NEGATIVE 08/03/2024 1038   LEUKOCYTESUR NEGATIVE 08/03/2024 1038    Radiological Exams on Admission: DG Chest Portable 1 View Result Date: 10/23/2024  CLINICAL DATA:  Shortness of breath. EXAM: PORTABLE CHEST 1 VIEW COMPARISON:  [place] 05/18/2024 FINDINGS: The cardio pericardial silhouette is enlarged. There is pulmonary vascular congestion without overt pulmonary edema. Streaky density in the parahilar left lung is stable with evidence of retrocardiac left base collapse/consolidation. Small left effusion not excluded. Telemetry leads overlie the chest. IMPRESSION: 1. Enlargement of the cardiopericardial silhouette with pulmonary vascular congestion. 2. Retrocardiac left base collapse/consolidation with possible small left effusion. Electronically Signed   By: Camellia Candle M.D.   On: 10/23/2024 12:39   EKG: Independently reviewed.  Sinus rhythm, rate 97, QTc 398.  No significant change from prior.  Assessment/Plan Principal Problem:   Acute on chronic hypoxic respiratory failure (HCC) Active Problems:   Essential hypertension   Obesity, Class III, BMI 40-49.9 (morbid obesity) (HCC)   Acute exacerbation of chronic obstructive pulmonary disease (COPD) (HCC)  Assessment and Plan:  Acute on chronic hypoxic respiratory failure-on 4 L at baseline, O2 sat dropped to 85% with ambulation.  Likely secondary to  COPD exacerbation and likely CHF.  COPD exacerbation- dyspnea, productive cough, diffuse expiratory wheezing on exam.  VBG shows pCO2 of 94, serum bicarb of greater than 45, with normal pH of 7.34 reflecting chronic Co2 retention.  Not on CPAP or BiPAP at home. - DuoNebs as needed and scheduled - 25 mg Solu-Medrol  given by EMS prior to arrival, continue 60 twice daily - Mucolytics - IV azithromycin  - SSI- S while on steroids  Acute congestive heart failure-type as yet unspecified.  Abdominal distention, reports baseline weight of about 220s, weight is 270s over the past month, ?? How much of this is accurate, fluid weight versus adipose.  Chest x-ray showing pulmonary vascular congestion.  proBNP unremarkable at 69.  Obese.  Reports compliance with Lasix  20 mg daily.  - IV Lasix  40 twice daily - Echocardiogram - Strict input output, daily weight, daily BMP  Hypertension-systolic 100-139. -Diurese with IV Lasix  - Hold HCTZ 25 mg daily for now   DVT prophylaxis: Lovenox  Code Status: FULL Family Communication:  none at bedside Disposition Plan: ~ /> days Consults called: None Admission status:  Inpt Tele I certify that at the point of admission it is my clinical judgment that the patient will require inpatient hospital care spanning beyond 2 midnights from the point of admission due to high intensity of service, high risk for further deterioration and high frequency of surveillance required.    Author: Tully FORBES Carwin, MD 10/23/2024 8:48 PM  For on call review [email or web address].     [1]  Allergies Allergen Reactions   Ibuprofen  Shortness Of Breath   "

## 2024-10-24 ENCOUNTER — Inpatient Hospital Stay (HOSPITAL_COMMUNITY)

## 2024-10-24 ENCOUNTER — Other Ambulatory Visit (HOSPITAL_COMMUNITY): Payer: Self-pay | Admitting: *Deleted

## 2024-10-24 DIAGNOSIS — J9621 Acute and chronic respiratory failure with hypoxia: Secondary | ICD-10-CM | POA: Diagnosis not present

## 2024-10-24 DIAGNOSIS — J9601 Acute respiratory failure with hypoxia: Secondary | ICD-10-CM | POA: Diagnosis not present

## 2024-10-24 LAB — BASIC METABOLIC PANEL WITH GFR
BUN: 13 mg/dL (ref 8–23)
CO2: >45 mmol/L — ABNORMAL HIGH (ref 22–32)
Calcium: 9 mg/dL (ref 8.9–10.3)
Chloride: 99 mmol/L (ref 98–111)
Creatinine, Ser: 0.82 mg/dL (ref 0.61–1.24)
GFR, Estimated: >60 mL/min
Glucose, Bld: 105 mg/dL — ABNORMAL HIGH (ref 70–99)
Potassium: 4.2 mmol/L (ref 3.5–5.1)
Sodium: 144 mmol/L (ref 135–145)

## 2024-10-24 LAB — RESP PANEL BY RT-PCR (RSV, FLU A&B, COVID)  RVPGX2
Influenza A by PCR: NEGATIVE
Influenza B by PCR: NEGATIVE
Resp Syncytial Virus by PCR: NEGATIVE
SARS Coronavirus 2 by RT PCR: NEGATIVE

## 2024-10-24 LAB — GLUCOSE, CAPILLARY
Glucose-Capillary: 107 mg/dL — ABNORMAL HIGH (ref 70–99)
Glucose-Capillary: 129 mg/dL — ABNORMAL HIGH (ref 70–99)
Glucose-Capillary: 143 mg/dL — ABNORMAL HIGH (ref 70–99)
Glucose-Capillary: 161 mg/dL — ABNORMAL HIGH (ref 70–99)

## 2024-10-24 LAB — ECHOCARDIOGRAM COMPLETE
Area-P 1/2: 3.77 cm2
Calc EF: 59.2 %
Height: 66 in
S' Lateral: 3.2 cm
Single Plane A2C EF: 58.2 %
Single Plane A4C EF: 60.8 %
Weight: 4321.02 [oz_av]

## 2024-10-24 MED ORDER — HYDRALAZINE HCL 20 MG/ML IJ SOLN: 10.0000 mg | Freq: Four times a day (QID) | INTRAMUSCULAR | Status: DC | PRN

## 2024-10-24 NOTE — Plan of Care (Signed)
  Problem: Clinical Measurements: Goal: Will remain free from infection Outcome: Progressing Goal: Respiratory complications will improve Outcome: Progressing   Problem: Activity: Goal: Risk for activity intolerance will decrease Outcome: Progressing   Problem: Coping: Goal: Level of anxiety will decrease Outcome: Progressing   Problem: Elimination: Goal: Will not experience complications related to bowel motility Outcome: Progressing Goal: Will not experience complications related to urinary retention Outcome: Progressing   Problem: Pain Managment: Goal: General experience of comfort will improve and/or be controlled Outcome: Progressing   Problem: Safety: Goal: Ability to remain free from injury will improve Outcome: Progressing   Problem: Skin Integrity: Goal: Risk for impaired skin integrity will decrease Outcome: Progressing

## 2024-10-24 NOTE — Progress Notes (Signed)
*  PRELIMINARY RESULTS* Echocardiogram 2D Echocardiogram has been performed.  Miguel Marsh 10/24/2024, 2:46 PM

## 2024-10-24 NOTE — Progress Notes (Addendum)
 " PROGRESS NOTE  Miguel Marsh, is a 62 y.o. male, DOB - 1961/03/14, FMW:[id number]  Admit date - 10/23/2024   Admitting Physician Tully FORBES Carwin, MD  Outpatient Primary MD for the patient is Renato Dorothey HERO, NP  LOS - 1  Chief Complaint  Patient presents with   Shortness of Breath      Brief Narrative:  63 y.o. male with medical history significant for COPD, hypertension, chronic respiratory failure on 4 L, cocaine use Admitted on 10/24/2023 with acute on chronic hypoxic respiratory failure with concerns for possible COPD exacerbation/CHF exacerbation    -Assessment and Plan: 1)Acute on chronic COPD exacerbation--- patient is a reformed smoker -he quit smoking about 3 months ago -VBG consistent with chronic CO2 retention, mostly compensated hypercapnic respiratory failure -Flu, RSV and COVID-negative - Continue IV Solu-Medrol , azithromycin  bronchodilators and mucolytic's  2) mild acute on chronic diastolic CHF exacerbation--echo from 10/18/2024 with EF of 50 to 50% with grade 1 diastolic dysfunction -proBNP 69 similar to prior -Chest x-ray consistent with pulmonary venous congestion without overt pulmonary edema - Continue IV Lasix  - Daily weights, fluid input and output monitoring  3) acute on chronic hypoxic and hypercapnic respiratory failure--due to #1 and #2 above - At baseline requires 4 L of oxygen  - Here patient required up to 6 L of oxygen  - Should improve with management of #1 #2 above  4)HTN--stable, hold hydrochlorothiazide  - IV hydralazine  as needed elevated BP  5)Disposition--patient is from a group home, anticipate discharge back when cardio -respiratory status has been optimized  Status is: Inpatient   Disposition: The patient is from: Group home              Anticipated d/c is to: Group home              Anticipated d/c date is: 2 days              Patient currently is not medically stable to d/c. Barriers: Not Clinically Stable-   Code Status  :  -  Code Status: Full Code   Family Communication:    NA (patient is alert, awake and coherent)   DVT Prophylaxis  :   - SCDs  /Lovenox    Lab Results  Component Value Date   PLT 215 10/23/2024    Inpatient Medications  Scheduled Meds:  azithromycin   500 mg Oral Daily   enoxaparin  (LOVENOX ) injection  60 mg Subcutaneous Q24H   furosemide   40 mg Intravenous BID   insulin  aspart  0-5 Units Subcutaneous QHS   insulin  aspart  0-9 Units Subcutaneous TID WC   ipratropium-albuterol   3 mL Nebulization TID   methylPREDNISolone  (SOLU-MEDROL ) injection  60 mg Intravenous Q12H   Followed by   NOREEN ON 10/25/2024] predniSONE   40 mg Oral Q breakfast   pregabalin   100 mg Oral BID   Continuous Infusions: PRN Meds:.acetaminophen  **OR** acetaminophen , HYDROcodone -acetaminophen , ipratropium-albuterol , ondansetron  **OR** ondansetron  (ZOFRAN ) IV, polyethylene glycol   Anti-infectives (From admission, onward)    Start     Dose/Rate Route Frequency Ordered Stop   10/24/24 2200  azithromycin  (ZITHROMAX ) tablet 500 mg       Placed in Followed by Linked Group   500 mg Oral Daily 10/23/24 2103 10/26/24 0959   10/23/24 2103  azithromycin  (ZITHROMAX ) 500 mg in sodium chloride  0.9 % 250 mL IVPB       Placed in Followed by Linked Group   500 mg 250 mL/hr over 60 Minutes Intravenous Every 24 hours 10/23/24 2103 10/24/24  9092       Subjective: Miguel Marsh today has no fevers, no emesis,  No chest pain,    Cough and dyspnea is not worse   Objective: Vitals:   10/24/24 0210 10/24/24 0500 10/24/24 0519 10/24/24 1415  BP: (!) 112/55  112/61 (!) 119/55  Pulse: 89  81 96  Resp: 18  18 17   Temp: 98.4 F (36.9 C)  98 F (36.7 C) 98.2 F (36.8 C)  TempSrc: Oral  Oral Oral  SpO2: 97%  96% 96%  Weight:  122.5 kg    Height:  5' 6 (1.676 m)      Intake/Output Summary (Last 24 hours) at 10/24/2024 1817 Last data filed at 10/24/2024 1508 Gross per 24 hour  Intake 480 ml  Output --   Net 480 ml   Filed Weights   10/23/24 1154 10/23/24 2103 10/24/24 0500  Weight: 123 kg 126.6 kg 122.5 kg    Physical Exam  Gen:- Awake Alert, no acute distress HEENT:- [place].AT, No sclera icterus Nose-  5L/min Neck-Supple Neck,No JVD,.  Lungs-diminished breath sounds with a few scattered wheezes bilaterally CV- S1, S2 normal, regular  Abd-  +ve B.Sounds, Abd Soft, No tenderness,    Extremity/Skin:- Trace  edema, pedal pulses present  Psych-affect is appropriate, oriented x3 Neuro-generalized weakness, no new focal deficits, no tremors  Data Reviewed: I have personally reviewed following labs and imaging studies  CBC: Recent Labs  Lab 10/23/24 1206  WBC 6.8  NEUTROABS 3.9  HGB 11.3*  HCT 37.8*  MCV 97.2  PLT 215   Basic Metabolic Panel: Recent Labs  Lab 10/23/24 1206 10/24/24 0456  NA 144 144  K 4.4 4.2  CL 97* 99  CO2 >45* >45*  GLUCOSE 102* 105*  BUN 10 13  CREATININE 0.91 0.82  CALCIUM 9.1 9.0   GFR: Estimated Creatinine Clearance: 113.9 mL/min (by C-G formula based on SCr of 0.82 mg/dL). Liver Function Tests: Recent Labs  Lab 10/23/24 1206  AST 21  ALT 12  ALKPHOS 82  BILITOT 0.3  PROT 6.7  ALBUMIN 3.9    BNP (last 3 results) Recent Labs    08/03/24 1033 09/13/24 0324 10/23/24 1206  PROBNP <50.0 69.3 69.0    Recent Results (from the past 240 hours)  Resp panel by RT-PCR (RSV, Flu A&B, Covid) Anterior Nasal Swab     Status: None   Collection Time: 10/24/24  3:23 AM   Specimen: Anterior Nasal Swab  Result Value Ref Range Status   SARS Coronavirus 2 by RT PCR NEGATIVE NEGATIVE Final    Comment: (NOTE) SARS-CoV-2 target nucleic acids are NOT DETECTED.  The SARS-CoV-2 RNA is generally detectable in upper respiratory specimens during the acute phase of infection. The lowest concentration of SARS-CoV-2 viral copies this assay can detect is 138 copies/mL. A negative result does not preclude SARS-Cov-2 infection and should not be used as  the sole basis for treatment or other patient management decisions. A negative result may occur with  improper specimen collection/handling, submission of specimen other than nasopharyngeal swab, presence of viral mutation(s) within the areas targeted by this assay, and inadequate number of viral copies(<138 copies/mL). A negative result must be combined with clinical observations, patient history, and epidemiological information. The expected result is Negative.  Fact Sheet for Patients:  bloggercourse.com  Fact Sheet for Healthcare Providers:  seriousbroker.it  This test is no t yet approved or cleared by the [place]  FDA and  has been authorized for detection and/or diagnosis  of SARS-CoV-2 by FDA under an Emergency Use Authorization (EUA). This EUA will remain  in effect (meaning this test can be used) for the duration of the COVID-19 declaration under Section 564(b)(1) of the Act, 21 U.S.C.section 360bbb-3(b)(1), unless the authorization is terminated  or revoked sooner.       Influenza A by PCR NEGATIVE NEGATIVE Final   Influenza B by PCR NEGATIVE NEGATIVE Final    Comment: (NOTE) The Xpert Xpress SARS-CoV-2/FLU/RSV plus assay is intended as an aid in the diagnosis of influenza from Nasopharyngeal swab specimens and should not be used as a sole basis for treatment. Nasal washings and aspirates are unacceptable for Xpert Xpress SARS-CoV-2/FLU/RSV testing.  Fact Sheet for Patients: bloggercourse.com  Fact Sheet for Healthcare Providers: seriousbroker.it  This test is not yet approved or cleared by the [place]  FDA and has been authorized for detection and/or diagnosis of SARS-CoV-2 by FDA under an Emergency Use Authorization (EUA). This EUA will remain in effect (meaning this test can be used) for the duration of the COVID-19 declaration under Section 564(b)(1) of  the Act, 21 U.S.C. section 360bbb-3(b)(1), unless the authorization is terminated or revoked.     Resp Syncytial Virus by PCR NEGATIVE NEGATIVE Final    Comment: (NOTE) Fact Sheet for Patients: bloggercourse.com  Fact Sheet for Healthcare Providers: seriousbroker.it  This test is not yet approved or cleared by the [place]  FDA and has been authorized for detection and/or diagnosis of SARS-CoV-2 by FDA under an Emergency Use Authorization (EUA). This EUA will remain in effect (meaning this test can be used) for the duration of the COVID-19 declaration under Section 564(b)(1) of the Act, 21 U.S.C. section 360bbb-3(b)(1), unless the authorization is terminated or revoked.  Performed at [facility], [address], [place], [place] [address]     Radiology Studies: ECHOCARDIOGRAM COMPLETE Result Date: 10/24/2024    ECHOCARDIOGRAM REPORT   Patient Name:   Miguel Marsh Date of Exam: 10/24/2024 Medical Rec #:  [id number]        Height:       66.0 in Accession #:    [id number]       Weight:       270.1 lb Date of Birth:  06-02-1961        BSA:          2.272 m Patient Age:    63 years         BP:           112/61 mmHg Patient Gender: M                HR:           81 bpm. Exam Location:  [facility] Procedure: 2D Echo, Cardiac Doppler, Color Doppler and Strain Analysis (Both            Spectral and Color Flow Doppler were utilized during procedure). Indications:    Acute on chronic hypoxic respiratory failure (HCC)  History:        Patient has prior history of Echocardiogram examinations, most                 recent 03/06/2023. COPD; Risk Factors:Hypertension and Obesity. Hx                 of cocaine abuse and COVID-19.  Sonographer:    Aida Pizza RCS Referring Phys: [phone] TULLY FORBES CARWIN  Sonographer Comments: Global longitudinal strain was attempted. IMPRESSIONS  1. Left ventricular ejection fraction,  by estimation, is 55 to 60%. The  left ventricle has normal function. Left ventricular endocardial border not optimally defined to evaluate regional wall motion. Left ventricular diastolic parameters are consistent with Grade I diastolic dysfunction (impaired relaxation). The average left ventricular global longitudinal strain is -9.6 %.  2. Right ventricular systolic function was not well visualized. The right ventricular size is normal. Tricuspid regurgitation signal is inadequate for assessing PA pressure.  3. The mitral valve is grossly normal. No evidence of mitral valve regurgitation. No evidence of mitral stenosis.  4. The aortic valve was not well visualized. Aortic valve regurgitation is not visualized. No aortic stenosis is present.  5. The inferior vena cava is normal in size with greater than 50% respiratory variability, suggesting right atrial pressure of 3 mmHg. Comparison(s): No significant change from prior study. FINDINGS  Left Ventricle: Left ventricular ejection fraction, by estimation, is 55 to 60%. The left ventricle has normal function. Left ventricular endocardial border not optimally defined to evaluate regional wall motion. The average left ventricular global longitudinal strain is -9.6 %. Strain was performed and the global longitudinal strain is indeterminate. The left ventricular internal cavity size was normal in size. There is no left ventricular hypertrophy. Left ventricular diastolic parameters are consistent with Grade I diastolic dysfunction (impaired relaxation). Normal left ventricular filling pressure. Right Ventricle: The right ventricular size is normal. No increase in right ventricular wall thickness. Right ventricular systolic function was not well visualized. Tricuspid regurgitation signal is inadequate for assessing PA pressure. Left Atrium: Left atrial size was normal in size. Right Atrium: Right atrial size was normal in size. Pericardium: There is no evidence of pericardial effusion. Mitral Valve: The  mitral valve is grossly normal. No evidence of mitral valve regurgitation. No evidence of mitral valve stenosis. Tricuspid Valve: The tricuspid valve is not well visualized. Tricuspid valve regurgitation is not demonstrated. No evidence of tricuspid stenosis. Aortic Valve: The aortic valve was not well visualized. Aortic valve regurgitation is not visualized. No aortic stenosis is present. Pulmonic Valve: The pulmonic valve was not well visualized. Pulmonic valve regurgitation is not visualized. No evidence of pulmonic stenosis. Aorta: The aortic root is normal in size and structure. Venous: The inferior vena cava is normal in size with greater than 50% respiratory variability, suggesting right atrial pressure of 3 mmHg. IAS/Shunts: No atrial level shunt detected by color flow Doppler. Additional Comments: 3D was performed not requiring image post processing on an independent workstation and was indeterminate.  LEFT VENTRICLE PLAX 2D LVIDd:         4.60 cm      Diastology LVIDs:         3.20 cm      LV e' medial:    11.70 cm/s LV PW:         1.10 cm      LV E/e' medial:  9.1 LV IVS:        1.00 cm      LV e' lateral:   12.60 cm/s LVOT diam:     2.00 cm      LV E/e' lateral: 8.5 LV SV:         92 LV SV Index:   41           2D Longitudinal Strain LVOT Area:     3.14 cm     2D Strain GLS Avg:     -9.6 %  LV Volumes (MOD) LV vol d, MOD A2C: 107.0 ml LV vol d,  MOD A4C: 92.6 ml LV vol s, MOD A2C: 44.7 ml LV vol s, MOD A4C: 36.3 ml LV SV MOD A2C:     62.3 ml LV SV MOD A4C:     92.6 ml LV SV MOD BP:      59.8 ml RIGHT VENTRICLE RV S prime:     18.50 cm/s TAPSE (M-mode): 2.9 cm LEFT ATRIUM             Index        RIGHT ATRIUM           Index LA diam:        3.00 cm 1.32 cm/m   RA Area:     16.90 cm LA Vol (A2C):   58.6 ml 25.79 ml/m  RA Volume:   46.00 ml  20.25 ml/m LA Vol (A4C):   53.0 ml 23.33 ml/m LA Biplane Vol: 57.5 ml 25.31 ml/m  AORTIC VALVE LVOT Vmax:   165.00 cm/s LVOT Vmean:  101.000 cm/s LVOT VTI:     0.293 m  AORTA Ao Root diam: 3.40 cm MITRAL VALVE MV Area (PHT): 3.77 cm     SHUNTS MV Decel Time: 201 msec     Systemic VTI:  0.29 m MV E velocity: 107.00 cm/s  Systemic Diam: 2.00 cm MV A velocity: 112.00 cm/s MV E/A ratio:  0.96 Vishnu Priya Mallipeddi Electronically signed by Diannah Late Mallipeddi Signature Date/Time: 10/24/2024/2:49:50 PM    Final    DG Chest Portable 1 View Result Date: 10/23/2024 CLINICAL DATA:  Shortness of breath. EXAM: PORTABLE CHEST 1 VIEW COMPARISON:  [facility] 05/18/2024 FINDINGS: The cardio pericardial silhouette is enlarged. There is pulmonary vascular congestion without overt pulmonary edema. Streaky density in the parahilar left lung is stable with evidence of retrocardiac left base collapse/consolidation. Small left effusion not excluded. Telemetry leads overlie the chest. IMPRESSION: 1. Enlargement of the cardiopericardial silhouette with pulmonary vascular congestion. 2. Retrocardiac left base collapse/consolidation with possible small left effusion. Electronically Signed   By: Camellia Candle M.D.   On: 10/23/2024 12:39   Scheduled Meds:  azithromycin   500 mg Oral Daily   enoxaparin  (LOVENOX ) injection  60 mg Subcutaneous Q24H   furosemide   40 mg Intravenous BID   insulin  aspart  0-5 Units Subcutaneous QHS   insulin  aspart  0-9 Units Subcutaneous TID WC   ipratropium-albuterol   3 mL Nebulization TID   methylPREDNISolone  (SOLU-MEDROL ) injection  60 mg Intravenous Q12H   Followed by   NOREEN ON 10/25/2024] predniSONE   40 mg Oral Q breakfast   pregabalin   100 mg Oral BID   Continuous Infusions:   LOS: 1 day   Rendall Carwin M.D on 10/24/2024 at 6:17 PM  Go to [email or web address] - for contact info  [facility] - Office  [phone]  If 7PM-7AM, please contact night-coverage [email or web address] 10/24/2024, 6:17 PM    "

## 2024-10-24 NOTE — Plan of Care (Signed)
  Problem: Education: Goal: Knowledge of General Education information will improve Description: Including pain rating scale, medication(s)/side effects and non-pharmacologic comfort measures Outcome: Progressing   Problem: Health Behavior/Discharge Planning: Goal: Ability to manage health-related needs will improve Outcome: Progressing   Problem: Clinical Measurements: Goal: Ability to maintain clinical measurements within normal limits will improve Outcome: Progressing Goal: Will remain free from infection Outcome: Progressing Goal: Diagnostic test results will improve Outcome: Progressing Goal: Respiratory complications will improve Outcome: Progressing Goal: Cardiovascular complication will be avoided Outcome: Progressing   Problem: Activity: Goal: Risk for activity intolerance will decrease Outcome: Progressing   Problem: Nutrition: Goal: Adequate nutrition will be maintained Outcome: Progressing   Problem: Coping: Goal: Level of anxiety will decrease Outcome: Progressing   Problem: Elimination: Goal: Will not experience complications related to bowel motility Outcome: Progressing Goal: Will not experience complications related to urinary retention Outcome: Progressing   Problem: Pain Managment: Goal: General experience of comfort will improve and/or be controlled Outcome: Progressing   Problem: Safety: Goal: Ability to remain free from injury will improve Outcome: Progressing   Problem: Skin Integrity: Goal: Risk for impaired skin integrity will decrease Outcome: Progressing   Problem: Education: Goal: Ability to demonstrate management of disease process will improve Outcome: Progressing Goal: Ability to verbalize understanding of medication therapies will improve Outcome: Progressing Goal: Individualized Educational Video(s) Outcome: Progressing   Problem: Activity: Goal: Capacity to carry out activities will improve Outcome: Progressing    Problem: Cardiac: Goal: Ability to achieve and maintain adequate cardiopulmonary perfusion will improve Outcome: Progressing   Problem: Education: Goal: Knowledge of disease or condition will improve Outcome: Progressing Goal: Knowledge of the prescribed therapeutic regimen will improve Outcome: Progressing Goal: Individualized Educational Video(s) Outcome: Progressing   Problem: Activity: Goal: Ability to tolerate increased activity will improve Outcome: Progressing Goal: Will verbalize the importance of balancing activity with adequate rest periods Outcome: Progressing   Problem: Respiratory: Goal: Ability to maintain a clear airway will improve Outcome: Progressing Goal: Levels of oxygenation will improve Outcome: Progressing Goal: Ability to maintain adequate ventilation will improve Outcome: Progressing   Problem: Education: Goal: Ability to describe self-care measures that may prevent or decrease complications (Diabetes Survival Skills Education) will improve Outcome: Progressing Goal: Individualized Educational Video(s) Outcome: Progressing   Problem: Coping: Goal: Ability to adjust to condition or change in health will improve Outcome: Progressing   Problem: Fluid Volume: Goal: Ability to maintain a balanced intake and output will improve Outcome: Progressing   Problem: Health Behavior/Discharge Planning: Goal: Ability to identify and utilize available resources and services will improve Outcome: Progressing Goal: Ability to manage health-related needs will improve Outcome: Progressing   Problem: Metabolic: Goal: Ability to maintain appropriate glucose levels will improve Outcome: Progressing   Problem: Nutritional: Goal: Maintenance of adequate nutrition will improve Outcome: Progressing Goal: Progress toward achieving an optimal weight will improve Outcome: Progressing   Problem: Skin Integrity: Goal: Risk for impaired skin integrity will  decrease Outcome: Progressing   Problem: Tissue Perfusion: Goal: Adequacy of tissue perfusion will improve Outcome: Progressing

## 2024-10-24 NOTE — TOC Initial Note (Signed)
 Transition of Care [facility]) - Initial/Assessment Note    Patient Details  Name: Miguel Marsh MRN: [id number] Date of Birth: [birth year]  [facility] CM/SW Contact:    Sharlyne Stabs, RN Phone Number: 10/24/2024, 2:08 PM  Clinical Narrative:    Patient admitted with acute on chronic hypoxic respiratory failure. Admitted and assessed last month.  Patient is diuresing, will need one to two days before discharging back to Group home. Patient lives at [facility]. Patient reports that he has been there for awhile and is fairly independent. He reports that they will assist if he needs something. Patient reports that he has oxygen , a cane, and WC at [facility]. Patient transportation services is through [facility]. ICM will continue to follow.      Barriers to Discharge: Continued Medical Work up  Patient Goals and CMS Choice Patient states their goals for this hospitalization and ongoing recovery are:: Return to ALF     Prior Living Arrangements/Services [facility]  Activities of Daily Living   ADL Screening (condition at time of admission) Independently performs ADLs?: Yes (appropriate for developmental age) Is the patient deaf or have difficulty hearing?: No Does the patient have difficulty seeing, even when wearing glasses/contacts?: No Does the patient have difficulty concentrating, remembering, or making decisions?: No    Admission diagnosis:  Hypercapnia [R06.89] COPD exacerbation (HCC) [J44.1] Acute on chronic hypoxic respiratory failure (HCC) [J96.21] Patient Active Problem List   Diagnosis Date Noted   Acute on chronic hypoxic respiratory failure (HCC) 10/23/2024   Anxiety 01/15/2024   Cocaine abuse (HCC) 09/13/2023   CAP (community acquired pneumonia) 06/24/2023   Lung nodule 06/24/2023   COVID-19 virus infection 06/24/2023   Elevated MCV 03/06/2023   Acute metabolic encephalopathy 02/01/2023   Leukocytosis 10/06/2020   Goals of care,  counseling/discussion    Palliative care by specialist    Encounter for smoking cessation counseling    Hyperglycemia 04/12/2020   Chronic respiratory failure with hypoxia and hypercapnia (HCC) 01/22/2020   Acute on chronic respiratory failure with hypoxia and hypercapnia (HCC) 01/23/2018   Obesity, Class III, BMI 40-49.9 (morbid obesity) (HCC) 01/01/2018   Acute exacerbation of chronic obstructive pulmonary disease (COPD) (HCC) 01/01/2018   Essential hypertension 12/09/2015   Non-small cell cancer of right lung (in remission) 12/09/2015   GERD (gastroesophageal reflux disease) 12/09/2015   Tobacco use disorder 12/17/2014   COPD exacerbation (HCC) 12/17/2014   Obesity 12/17/2014   Dyspnea    Difficulty walking 04/02/2013   Hip weakness 04/02/2013   PCP:  Renato Dorothey HERO, NP Pharmacy:   [facility],  - [address] [place] [address] Phone: [phone] Fax: [phone]     Social Drivers of Health (SDOH) Social History: SDOH Screenings   Food Insecurity: No Food Insecurity (10/24/2024)  Housing: Low Risk (10/24/2024)  Transportation Needs: No Transportation Needs (10/24/2024)  Utilities: Not At Risk (10/24/2024)  Social Connections: Moderately Integrated (10/24/2024)  Tobacco Use: High Risk (10/23/2024)   SDOH Interventions:     Readmission Risk Interventions    10/24/2024    2:07 PM 07/12/2024    4:22 PM 05/20/2024    9:57 AM  Readmission Risk Prevention Plan  Transportation Screening Complete Complete Complete  Medication Review Oceanographer) Complete Complete Complete  PCP or Specialist appointment within 3-5 days of discharge Not Complete Complete   HRI or Home Care Consult Complete Complete Complete  SW Recovery Care/Counseling Consult  Complete Complete  Palliative  Care Screening Not Applicable Not Applicable Not Applicable  Skilled Nursing Facility Not Applicable Not Applicable Not Applicable

## 2024-10-25 DIAGNOSIS — J9621 Acute and chronic respiratory failure with hypoxia: Secondary | ICD-10-CM | POA: Diagnosis not present

## 2024-10-25 LAB — BASIC METABOLIC PANEL WITH GFR
BUN: 18 mg/dL (ref 8–23)
CO2: >45 mmol/L — ABNORMAL HIGH (ref 22–32)
Calcium: 9.4 mg/dL (ref 8.9–10.3)
Chloride: 95 mmol/L — ABNORMAL LOW (ref 98–111)
Creatinine, Ser: 0.82 mg/dL (ref 0.61–1.24)
GFR, Estimated: >60 mL/min
Glucose, Bld: 150 mg/dL — ABNORMAL HIGH (ref 70–99)
Potassium: 4 mmol/L (ref 3.5–5.1)
Sodium: 142 mmol/L (ref 135–145)

## 2024-10-25 LAB — GLUCOSE, CAPILLARY
Glucose-Capillary: 136 mg/dL — ABNORMAL HIGH (ref 70–99)
Glucose-Capillary: 102 mg/dL — ABNORMAL HIGH (ref 70–99)
Glucose-Capillary: 160 mg/dL — ABNORMAL HIGH (ref 70–99)
Glucose-Capillary: 107 mg/dL — ABNORMAL HIGH (ref 70–99)

## 2024-10-25 MED ORDER — METHYLPREDNISOLONE SODIUM SUCC 40 MG IJ SOLR
40.0000 mg | Freq: Two times a day (BID) | INTRAMUSCULAR | Status: DC
  Administered 2024-10-25 – 2024-10-27 (×4): 40 mg via INTRAVENOUS
  Filled 2024-10-25 (×3): qty 1

## 2024-10-25 MED ORDER — ORAL CARE MOUTH RINSE: 15.0000 mL | OROMUCOSAL | Status: DC | PRN

## 2024-10-25 MED ORDER — CLONAZEPAM 0.5 MG PO TABS
1.0000 mg | ORAL_TABLET | Freq: Two times a day (BID) | ORAL | Status: DC | PRN
  Administered 2024-10-25 – 2024-10-27 (×3): 1 mg via ORAL
  Filled 2024-10-25 (×2): qty 2

## 2024-10-25 MED ORDER — BUDESON-GLYCOPYRROL-FORMOTEROL 160-9-4.8 MCG/ACT IN AERO
2.0000 | INHALATION_SPRAY | Freq: Two times a day (BID) | RESPIRATORY_TRACT | Status: DC
  Administered 2024-10-25 – 2024-10-27 (×4): 2 via RESPIRATORY_TRACT
  Filled 2024-10-25: qty 5.9

## 2024-10-25 NOTE — Plan of Care (Signed)
  Problem: Education: Goal: Knowledge of General Education information will improve Description: Including pain rating scale, medication(s)/side effects and non-pharmacologic comfort measures Outcome: Progressing   Problem: Health Behavior/Discharge Planning: Goal: Ability to manage health-related needs will improve Outcome: Progressing   Problem: Clinical Measurements: Goal: Ability to maintain clinical measurements within normal limits will improve Outcome: Progressing Goal: Will remain free from infection Outcome: Progressing Goal: Diagnostic test results will improve Outcome: Progressing Goal: Respiratory complications will improve Outcome: Progressing Goal: Cardiovascular complication will be avoided Outcome: Progressing   Problem: Activity: Goal: Risk for activity intolerance will decrease Outcome: Progressing   Problem: Nutrition: Goal: Adequate nutrition will be maintained Outcome: Progressing   Problem: Coping: Goal: Level of anxiety will decrease Outcome: Progressing   Problem: Elimination: Goal: Will not experience complications related to bowel motility Outcome: Progressing Goal: Will not experience complications related to urinary retention Outcome: Progressing   Problem: Pain Managment: Goal: General experience of comfort will improve and/or be controlled Outcome: Progressing   Problem: Safety: Goal: Ability to remain free from injury will improve Outcome: Progressing   Problem: Skin Integrity: Goal: Risk for impaired skin integrity will decrease Outcome: Progressing   Problem: Education: Goal: Ability to demonstrate management of disease process will improve Outcome: Progressing Goal: Ability to verbalize understanding of medication therapies will improve Outcome: Progressing Goal: Individualized Educational Video(s) Outcome: Progressing   Problem: Activity: Goal: Capacity to carry out activities will improve Outcome: Progressing    Problem: Cardiac: Goal: Ability to achieve and maintain adequate cardiopulmonary perfusion will improve Outcome: Progressing   Problem: Education: Goal: Knowledge of disease or condition will improve Outcome: Progressing Goal: Knowledge of the prescribed therapeutic regimen will improve Outcome: Progressing Goal: Individualized Educational Video(s) Outcome: Progressing   Problem: Activity: Goal: Ability to tolerate increased activity will improve Outcome: Progressing Goal: Will verbalize the importance of balancing activity with adequate rest periods Outcome: Progressing   Problem: Respiratory: Goal: Ability to maintain a clear airway will improve Outcome: Progressing Goal: Levels of oxygenation will improve Outcome: Progressing Goal: Ability to maintain adequate ventilation will improve Outcome: Progressing   Problem: Education: Goal: Ability to describe self-care measures that may prevent or decrease complications (Diabetes Survival Skills Education) will improve Outcome: Progressing Goal: Individualized Educational Video(s) Outcome: Progressing   Problem: Coping: Goal: Ability to adjust to condition or change in health will improve Outcome: Progressing   Problem: Fluid Volume: Goal: Ability to maintain a balanced intake and output will improve Outcome: Progressing   Problem: Health Behavior/Discharge Planning: Goal: Ability to identify and utilize available resources and services will improve Outcome: Progressing Goal: Ability to manage health-related needs will improve Outcome: Progressing   Problem: Metabolic: Goal: Ability to maintain appropriate glucose levels will improve Outcome: Progressing   Problem: Nutritional: Goal: Maintenance of adequate nutrition will improve Outcome: Progressing Goal: Progress toward achieving an optimal weight will improve Outcome: Progressing   Problem: Skin Integrity: Goal: Risk for impaired skin integrity will  decrease Outcome: Progressing   Problem: Tissue Perfusion: Goal: Adequacy of tissue perfusion will improve Outcome: Progressing

## 2024-10-25 NOTE — Progress Notes (Signed)
 " PROGRESS NOTE  Miguel Marsh, is a 63 y.o. male, DOB - 04/19/1961, FMW:[id number]  Admit date - 10/23/2024   Admitting Physician Tully FORBES Carwin, MD  Outpatient Primary MD for the patient is Renato Dorothey HERO, NP  LOS - 2  Chief Complaint  Patient presents with   Shortness of Breath      Brief Narrative:  63 y.o. male with medical history significant for COPD, hypertension, chronic respiratory failure on 4 L, cocaine use Admitted on 10/24/2023 with acute on chronic hypoxic respiratory failure with concerns for possible COPD exacerbation/CHF exacerbation    -Assessment and Plan: 1)Acute on chronic COPD exacerbation--- patient is a reformed smoker -he quit smoking about 3 months ago -VBG consistent with chronic CO2 retention, mostly compensated hypercapnic respiratory failure -Flu, RSV and COVID-negative - Continue IV Solu-Medrol , azithromycin  bronchodilators and mucolytic's  2) mild acute on chronic diastolic CHF exacerbation--echo from 10/18/2024 with EF of 50 to 50% with grade 1 diastolic dysfunction -proBNP 69 similar to prior -Chest x-ray consistent with pulmonary venous congestion without overt pulmonary edema - Continue IV Lasix  - Daily weights, fluid input and output monitoring - Repeat chest x-ray in a.m. on 10/26/2024  3) acute on chronic hypoxic and hypercapnic respiratory failure--due to #1 and #2 above - At baseline requires 4 L of oxygen  - Here patient required up to 6 L of oxygen  - Should improve with management of #1 #2 above  4)HTN--stable, hold hydrochlorothiazide  - IV hydralazine  as needed elevated BP  5)Disposition--patient is from a group home, anticipate discharge back when cardio -respiratory status has been optimized  Status is: Inpatient   Disposition: The patient is from: Group home              Anticipated d/c is to: Group home              Anticipated d/c date is: 1 day              Patient currently is not medically stable to  d/c. Barriers: Not Clinically Stable-   Code Status :  -  Code Status: Full Code   Family Communication:    NA (patient is alert, awake and coherent)   DVT Prophylaxis  :   - SCDs  /Lovenox    Lab Results  Component Value Date   PLT 215 10/23/2024    Inpatient Medications  Scheduled Meds:  budesonide -glycopyrrolate -formoterol   2 puff Inhalation BID   enoxaparin  (LOVENOX ) injection  60 mg Subcutaneous Q24H   furosemide   40 mg Intravenous BID   insulin  aspart  0-5 Units Subcutaneous QHS   insulin  aspart  0-9 Units Subcutaneous TID WC   ipratropium-albuterol   3 mL Nebulization TID   methylPREDNISolone  (SOLU-MEDROL ) injection  40 mg Intravenous Q12H   pregabalin   100 mg Oral BID   Continuous Infusions: PRN Meds:.acetaminophen  **OR** acetaminophen , clonazePAM , hydrALAZINE , HYDROcodone -acetaminophen , ipratropium-albuterol , ondansetron  **OR** ondansetron  (ZOFRAN ) IV, mouth rinse, polyethylene glycol   Anti-infectives (From admission, onward)    Start     Dose/Rate Route Frequency Ordered Stop   10/24/24 2200  azithromycin  (ZITHROMAX ) tablet 500 mg       Placed in Followed by Linked Group   500 mg Oral Daily 10/23/24 2103 10/25/24 0855   10/23/24 2103  azithromycin  (ZITHROMAX ) 500 mg in sodium chloride  0.9 % 250 mL IVPB       Placed in Followed by Linked Group   500 mg 250 mL/hr over 60 Minutes Intravenous Every 24 hours 10/23/24 2103 10/24/24 9092  Subjective: Miguel Marsh today has no fevers, no emesis,  No chest pain,    Cough dyspnea and increased oxygen  requirement persist - Patient requesting Klonopin  for his anxiety   Objective: Vitals:   10/24/24 2015 10/25/24 0300 10/25/24 0423 10/25/24 1337  BP: (!) 111/98  120/75 139/68  Pulse: 88  84 89  Resp: 18  18 20   Temp: 97.7 F (36.5 C)  97.8 F (36.6 C) 98.3 F (36.8 C)  TempSrc: Oral  Oral Oral  SpO2: 99%  98% 96%  Weight:  123.9 kg    Height:        Intake/Output Summary (Last 24 hours) at  10/25/2024 1904 Last data filed at 10/25/2024 1753 Gross per 24 hour  Intake 720 ml  Output --  Net 720 ml   Filed Weights   10/23/24 2103 10/24/24 0500 10/25/24 0300  Weight: 126.6 kg 122.5 kg 123.9 kg    Physical Exam  Gen:- Awake Alert, no acute distress HEENT:- [place].AT, No sclera icterus Nose- [place] 5L/min Neck-Supple Neck,No JVD,.  Lungs-diminished breath sounds with a few scattered wheezes bilaterally CV- S1, S2 normal, regular  Abd-  +ve B.Sounds, Abd Soft, No tenderness,    Extremity/Skin:- Trace  edema, pedal pulses present  Psych-affect is appropriate, oriented x3 Neuro-generalized weakness, no new focal deficits, no tremors  Data Reviewed: I have personally reviewed following labs and imaging studies  CBC: Recent Labs  Lab 10/23/24 1206  WBC 6.8  NEUTROABS 3.9  HGB 11.3*  HCT 37.8*  MCV 97.2  PLT 215   Basic Metabolic Panel: Recent Labs  Lab 10/23/24 1206 10/24/24 0456 10/25/24 0553  NA 144 144 142  K 4.4 4.2 4.0  CL 97* 99 95*  CO2 >45* >45* >45*  GLUCOSE 102* 105* 150*  BUN 10 13 18   CREATININE 0.91 0.82 0.82  CALCIUM 9.1 9.0 9.4   GFR: Estimated Creatinine Clearance: 114.5 mL/min (by C-G formula based on SCr of 0.82 mg/dL). Liver Function Tests: Recent Labs  Lab 10/23/24 1206  AST 21  ALT 12  ALKPHOS 82  BILITOT 0.3  PROT 6.7  ALBUMIN 3.9    BNP (last 3 results) Recent Labs    08/03/24 1033 09/13/24 0324 10/23/24 1206  PROBNP <50.0 69.3 69.0    Recent Results (from the past 240 hours)  Resp panel by RT-PCR (RSV, Flu A&B, Covid) Anterior Nasal Swab     Status: None   Collection Time: 10/24/24  3:23 AM   Specimen: Anterior Nasal Swab  Result Value Ref Range Status   SARS Coronavirus 2 by RT PCR NEGATIVE NEGATIVE Final    Comment: (NOTE) SARS-CoV-2 target nucleic acids are NOT DETECTED.  The SARS-CoV-2 RNA is generally detectable in upper respiratory specimens during the acute phase of infection. The lowest concentration of  SARS-CoV-2 viral copies this assay can detect is 138 copies/mL. A negative result does not preclude SARS-Cov-2 infection and should not be used as the sole basis for treatment or other patient management decisions. A negative result may occur with  improper specimen collection/handling, submission of specimen other than nasopharyngeal swab, presence of viral mutation(s) within the areas targeted by this assay, and inadequate number of viral copies(<138 copies/mL). A negative result must be combined with clinical observations, patient history, and epidemiological information. The expected result is Negative.  Fact Sheet for Patients:  bloggercourse.com  Fact Sheet for Healthcare Providers:  seriousbroker.it  This test is no t yet approved or cleared by the [place]  FDA and  has been authorized for detection and/or diagnosis of SARS-CoV-2 by FDA under an Emergency Use Authorization (EUA). This EUA will remain  in effect (meaning this test can be used) for the duration of the COVID-19 declaration under Section 564(b)(1) of the Act, 21 U.S.C.section 360bbb-3(b)(1), unless the authorization is terminated  or revoked sooner.       Influenza A by PCR NEGATIVE NEGATIVE Final   Influenza B by PCR NEGATIVE NEGATIVE Final    Comment: (NOTE) The Xpert Xpress SARS-CoV-2/FLU/RSV plus assay is intended as an aid in the diagnosis of influenza from Nasopharyngeal swab specimens and should not be used as a sole basis for treatment. Nasal washings and aspirates are unacceptable for Xpert Xpress SARS-CoV-2/FLU/RSV testing.  Fact Sheet for Patients: bloggercourse.com  Fact Sheet for Healthcare Providers: seriousbroker.it  This test is not yet approved or cleared by the [place]  FDA and has been authorized for detection and/or diagnosis of SARS-CoV-2 by FDA under an Emergency Use  Authorization (EUA). This EUA will remain in effect (meaning this test can be used) for the duration of the COVID-19 declaration under Section 564(b)(1) of the Act, 21 U.S.C. section 360bbb-3(b)(1), unless the authorization is terminated or revoked.     Resp Syncytial Virus by PCR NEGATIVE NEGATIVE Final    Comment: (NOTE) Fact Sheet for Patients: bloggercourse.com  Fact Sheet for Healthcare Providers: seriousbroker.it  This test is not yet approved or cleared by the [place]  FDA and has been authorized for detection and/or diagnosis of SARS-CoV-2 by FDA under an Emergency Use Authorization (EUA). This EUA will remain in effect (meaning this test can be used) for the duration of the COVID-19 declaration under Section 564(b)(1) of the Act, 21 U.S.C. section 360bbb-3(b)(1), unless the authorization is terminated or revoked.  Performed at [facility], [address], [place], [place] [address]     Radiology Studies: ECHOCARDIOGRAM COMPLETE Result Date: 10/24/2024    ECHOCARDIOGRAM REPORT   Patient Name:   Miguel Marsh Date of Exam: 10/24/2024 Medical Rec #:  [id number]        Height:       66.0 in Accession #:    [id number]       Weight:       270.1 lb Date of Birth:  12/07/1960        BSA:          2.272 m Patient Age:    63 years         BP:           112/61 mmHg Patient Gender: M                HR:           81 bpm. Exam Location:  [facility] Procedure: 2D Echo, Cardiac Doppler, Color Doppler and Strain Analysis (Both            Spectral and Color Flow Doppler were utilized during procedure). Indications:    Acute on chronic hypoxic respiratory failure (HCC)  History:        Patient has prior history of Echocardiogram examinations, most                 recent 03/06/2023. COPD; Risk Factors:Hypertension and Obesity. Hx                 of cocaine abuse and COVID-19.  Sonographer:    Aida Pizza RCS Referring Phys: [phone] TULLY FORBES CARWIN  Sonographer Comments: Global longitudinal strain was  attempted. IMPRESSIONS  1. Left ventricular ejection fraction, by estimation, is 55 to 60%. The left ventricle has normal function. Left ventricular endocardial border not optimally defined to evaluate regional wall motion. Left ventricular diastolic parameters are consistent with Grade I diastolic dysfunction (impaired relaxation). The average left ventricular global longitudinal strain is -9.6 %.  2. Right ventricular systolic function was not well visualized. The right ventricular size is normal. Tricuspid regurgitation signal is inadequate for assessing PA pressure.  3. The mitral valve is grossly normal. No evidence of mitral valve regurgitation. No evidence of mitral stenosis.  4. The aortic valve was not well visualized. Aortic valve regurgitation is not visualized. No aortic stenosis is present.  5. The inferior vena cava is normal in size with greater than 50% respiratory variability, suggesting right atrial pressure of 3 mmHg. Comparison(s): No significant change from prior study. FINDINGS  Left Ventricle: Left ventricular ejection fraction, by estimation, is 55 to 60%. The left ventricle has normal function. Left ventricular endocardial border not optimally defined to evaluate regional wall motion. The average left ventricular global longitudinal strain is -9.6 %. Strain was performed and the global longitudinal strain is indeterminate. The left ventricular internal cavity size was normal in size. There is no left ventricular hypertrophy. Left ventricular diastolic parameters are consistent with Grade I diastolic dysfunction (impaired relaxation). Normal left ventricular filling pressure. Right Ventricle: The right ventricular size is normal. No increase in right ventricular wall thickness. Right ventricular systolic function was not well visualized. Tricuspid regurgitation signal is inadequate for assessing PA pressure. Left Atrium: Left  atrial size was normal in size. Right Atrium: Right atrial size was normal in size. Pericardium: There is no evidence of pericardial effusion. Mitral Valve: The mitral valve is grossly normal. No evidence of mitral valve regurgitation. No evidence of mitral valve stenosis. Tricuspid Valve: The tricuspid valve is not well visualized. Tricuspid valve regurgitation is not demonstrated. No evidence of tricuspid stenosis. Aortic Valve: The aortic valve was not well visualized. Aortic valve regurgitation is not visualized. No aortic stenosis is present. Pulmonic Valve: The pulmonic valve was not well visualized. Pulmonic valve regurgitation is not visualized. No evidence of pulmonic stenosis. Aorta: The aortic root is normal in size and structure. Venous: The inferior vena cava is normal in size with greater than 50% respiratory variability, suggesting right atrial pressure of 3 mmHg. IAS/Shunts: No atrial level shunt detected by color flow Doppler. Additional Comments: 3D was performed not requiring image post processing on an independent workstation and was indeterminate.  LEFT VENTRICLE PLAX 2D LVIDd:         4.60 cm      Diastology LVIDs:         3.20 cm      LV e' medial:    11.70 cm/s LV PW:         1.10 cm      LV E/e' medial:  9.1 LV IVS:        1.00 cm      LV e' lateral:   12.60 cm/s LVOT diam:     2.00 cm      LV E/e' lateral: 8.5 LV SV:         92 LV SV Index:   41           2D Longitudinal Strain LVOT Area:     3.14 cm     2D Strain GLS Avg:     -9.6 %  LV Volumes (MOD) LV vol d,  MOD A2C: 107.0 ml LV vol d, MOD A4C: 92.6 ml LV vol s, MOD A2C: 44.7 ml LV vol s, MOD A4C: 36.3 ml LV SV MOD A2C:     62.3 ml LV SV MOD A4C:     92.6 ml LV SV MOD BP:      59.8 ml RIGHT VENTRICLE RV S prime:     18.50 cm/s TAPSE (M-mode): 2.9 cm LEFT ATRIUM             Index        RIGHT ATRIUM           Index LA diam:        3.00 cm 1.32 cm/m   RA Area:     16.90 cm LA Vol (A2C):   58.6 ml 25.79 ml/m  RA Volume:   46.00 ml   20.25 ml/m LA Vol (A4C):   53.0 ml 23.33 ml/m LA Biplane Vol: 57.5 ml 25.31 ml/m  AORTIC VALVE LVOT Vmax:   165.00 cm/s LVOT Vmean:  101.000 cm/s LVOT VTI:    0.293 m  AORTA Ao Root diam: 3.40 cm MITRAL VALVE MV Area (PHT): 3.77 cm     SHUNTS MV Decel Time: 201 msec     Systemic VTI:  0.29 m MV E velocity: 107.00 cm/s  Systemic Diam: 2.00 cm MV A velocity: 112.00 cm/s MV E/A ratio:  0.96 Vishnu Priya Mallipeddi Electronically signed by Diannah Late Mallipeddi Signature Date/Time: 10/24/2024/2:49:50 PM    Final    Scheduled Meds:  budesonide -glycopyrrolate -formoterol   2 puff Inhalation BID   enoxaparin  (LOVENOX ) injection  60 mg Subcutaneous Q24H   furosemide   40 mg Intravenous BID   insulin  aspart  0-5 Units Subcutaneous QHS   insulin  aspart  0-9 Units Subcutaneous TID WC   ipratropium-albuterol   3 mL Nebulization TID   methylPREDNISolone  (SOLU-MEDROL ) injection  40 mg Intravenous Q12H   pregabalin   100 mg Oral BID   Continuous Infusions:   LOS: 2 days   Rendall Carwin M.D on 10/25/2024 at 7:04 PM  Go to [email or web address] - for contact info  [facility] - Office  [phone]  If 7PM-7AM, please contact night-coverage [email or web address] 10/25/2024, 7:04 PM    "

## 2024-10-26 ENCOUNTER — Inpatient Hospital Stay (HOSPITAL_COMMUNITY)

## 2024-10-26 DIAGNOSIS — J9621 Acute and chronic respiratory failure with hypoxia: Secondary | ICD-10-CM | POA: Diagnosis not present

## 2024-10-26 LAB — GLUCOSE, CAPILLARY
Glucose-Capillary: 95 mg/dL (ref 70–99)
Glucose-Capillary: 231 mg/dL — ABNORMAL HIGH (ref 70–99)
Glucose-Capillary: 185 mg/dL — ABNORMAL HIGH (ref 70–99)
Glucose-Capillary: 172 mg/dL — ABNORMAL HIGH (ref 70–99)

## 2024-10-26 MED ORDER — AZITHROMYCIN 250 MG PO TABS
500.0000 mg | ORAL_TABLET | Freq: Every day | ORAL | Status: DC
  Administered 2024-10-26: 500 mg via ORAL
  Filled 2024-10-26: qty 2

## 2024-10-26 NOTE — Progress Notes (Signed)
 " PROGRESS NOTE  Miguel Marsh, is a 63 y.o. male, DOB - 26-Jul-1961, FMW:[id number]  Admit date - 10/23/2024   Admitting Physician Tully FORBES Carwin, MD  Outpatient Primary MD for the patient is Renato Dorothey HERO, NP  LOS - 3  Chief Complaint  Patient presents with   Shortness of Breath      Brief Narrative:  63 y.o. male with medical history significant for COPD, hypertension, chronic respiratory failure on 4 L, cocaine use Admitted on 10/24/2023 with acute on chronic hypoxic respiratory failure with concerns for possible COPD exacerbation/CHF exacerbation    -Assessment and Plan: 1)Acute on chronic COPD exacerbation--- patient is a reformed smoker -he quit smoking about 3 months ago -VBG consistent with chronic CO2 retention, mostly compensated hypercapnic respiratory failure -Flu, RSV and COVID-negative - Continue IV Solu-Medrol , azithromycin  bronchodilators and mucolytic's - Overall respiratory status and oxygen  requirement improving slowly- = Possible discharge back to group home on 10/27/2024 if continues to improve  2) mild acute on chronic diastolic CHF exacerbation--echo from 10/18/2024 with EF of 50 to 50% with grade 1 diastolic dysfunction -proBNP 69 similar to prior -Chest x-ray consistent with pulmonary venous congestion without overt pulmonary edema - Continue IV Lasix  - Daily weights, fluid input and output monitoring - Repeat chest x-ray in a.m. on 10/26/2024  3) acute on chronic hypoxic and hypercapnic respiratory failure--due to #1 and #2 above - At baseline requires 4 L of oxygen  - Here patient required up to 6 L of oxygen  - Should improve with management of #1 #2 above  4)HTN--stable, hold hydrochlorothiazide  - IV hydralazine  as needed elevated BP  5)Disposition--patient is from a group home, anticipate discharge back when cardio -respiratory status has been optimized  Status is: Inpatient   Disposition: The patient is from: Group home               Anticipated d/c is to: Group home              Anticipated d/c date is: 1 day              Patient currently is not medically stable to d/c. Barriers: Not Clinically Stable-   Code Status :  -  Code Status: Full Code   Family Communication:    NA (patient is alert, awake and coherent)   DVT Prophylaxis  :   - SCDs  /Lovenox    Lab Results  Component Value Date   PLT 215 10/23/2024   Inpatient Medications  Scheduled Meds:  azithromycin   500 mg Oral Daily   budesonide -glycopyrrolate -formoterol   2 puff Inhalation BID   enoxaparin  (LOVENOX ) injection  60 mg Subcutaneous Q24H   furosemide   40 mg Intravenous BID   insulin  aspart  0-5 Units Subcutaneous QHS   insulin  aspart  0-9 Units Subcutaneous TID WC   ipratropium-albuterol   3 mL Nebulization TID   methylPREDNISolone  (SOLU-MEDROL ) injection  40 mg Intravenous Q12H   pregabalin   100 mg Oral BID   Continuous Infusions: PRN Meds:.acetaminophen  **OR** acetaminophen , clonazePAM , hydrALAZINE , HYDROcodone -acetaminophen , ipratropium-albuterol , ondansetron  **OR** ondansetron  (ZOFRAN ) IV, mouth rinse, polyethylene glycol   Anti-infectives (From admission, onward)    Start     Dose/Rate Route Frequency Ordered Stop   10/26/24 1830  azithromycin  (ZITHROMAX ) tablet 500 mg        500 mg Oral Daily 10/26/24 1734 10/29/24 0959   10/24/24 2200  azithromycin  (ZITHROMAX ) tablet 500 mg       Placed in Followed by Linked Group   500 mg Oral  Daily 10/23/24 2103 10/25/24 0855   10/23/24 2103  azithromycin  (ZITHROMAX ) 500 mg in sodium chloride  0.9 % 250 mL IVPB       Placed in Followed by Linked Group   500 mg 250 mL/hr over 60 Minutes Intravenous Every 24 hours 10/23/24 2103 10/24/24 9092       Subjective: Miguel Marsh today has no fevers, no emesis,  No chest pain,    -Dyspnea and hypoxia improving - Cough is less bothersome   Objective: Vitals:   10/26/24 0500 10/26/24 0802 10/26/24 1312 10/26/24 1420  BP: 125/72   122/66   Pulse: 88   76  Resp: 17     Temp: 97.9 F (36.6 C)   98 F (36.7 C)  TempSrc: Oral   Oral  SpO2: 95% 96% 96% 98%  Weight: 121.8 kg     Height:        Intake/Output Summary (Last 24 hours) at 10/26/2024 1734 Last data filed at 10/26/2024 0900 Gross per 24 hour  Intake 720 ml  Output --  Net 720 ml   Filed Weights   10/24/24 0500 10/25/24 0300 10/26/24 0500  Weight: 122.5 kg 123.9 kg 121.8 kg    Physical Exam Gen:- Awake Alert, no acute distress HEENT:- [place].AT, No sclera icterus Nose- [place] 5L/min Neck-Supple Neck,No JVD,.  Lungs- improving air movement with a few scattered wheezes bilaterally CV- S1, S2 normal, regular  Abd-  +ve B.Sounds, Abd Soft, No tenderness,    Extremity/Skin:- Trace  edema, pedal pulses present  Psych-affect is appropriate, oriented x3 Neuro-generalized weakness, no new focal deficits, no tremors  Data Reviewed: I have personally reviewed following labs and imaging studies  CBC: Recent Labs  Lab 10/23/24 1206  WBC 6.8  NEUTROABS 3.9  HGB 11.3*  HCT 37.8*  MCV 97.2  PLT 215   Basic Metabolic Panel: Recent Labs  Lab 10/23/24 1206 10/24/24 0456 10/25/24 0553  NA 144 144 142  K 4.4 4.2 4.0  CL 97* 99 95*  CO2 >45* >45* >45*  GLUCOSE 102* 105* 150*  BUN 10 13 18   CREATININE 0.91 0.82 0.82  CALCIUM 9.1 9.0 9.4   GFR: Estimated Creatinine Clearance: 113.5 mL/min (by C-G formula based on SCr of 0.82 mg/dL). Liver Function Tests: Recent Labs  Lab 10/23/24 1206  AST 21  ALT 12  ALKPHOS 82  BILITOT 0.3  PROT 6.7  ALBUMIN 3.9    BNP (last 3 results) Recent Labs    08/03/24 1033 09/13/24 0324 10/23/24 1206  PROBNP <50.0 69.3 69.0    Recent Results (from the past 240 hours)  Resp panel by RT-PCR (RSV, Flu A&B, Covid) Anterior Nasal Swab     Status: None   Collection Time: 10/24/24  3:23 AM   Specimen: Anterior Nasal Swab  Result Value Ref Range Status   SARS Coronavirus 2 by RT PCR NEGATIVE NEGATIVE Final    Comment:  (NOTE) SARS-CoV-2 target nucleic acids are NOT DETECTED.  The SARS-CoV-2 RNA is generally detectable in upper respiratory specimens during the acute phase of infection. The lowest concentration of SARS-CoV-2 viral copies this assay can detect is 138 copies/mL. A negative result does not preclude SARS-Cov-2 infection and should not be used as the sole basis for treatment or other patient management decisions. A negative result may occur with  improper specimen collection/handling, submission of specimen other than nasopharyngeal swab, presence of viral mutation(s) within the areas targeted by this assay, and inadequate number of viral copies(<138 copies/mL). A negative result  must be combined with clinical observations, patient history, and epidemiological information. The expected result is Negative.  Fact Sheet for Patients:  bloggercourse.com  Fact Sheet for Healthcare Providers:  seriousbroker.it  This test is no t yet approved or cleared by the [place]  FDA and  has been authorized for detection and/or diagnosis of SARS-CoV-2 by FDA under an Emergency Use Authorization (EUA). This EUA will remain  in effect (meaning this test can be used) for the duration of the COVID-19 declaration under Section 564(b)(1) of the Act, 21 U.S.C.section 360bbb-3(b)(1), unless the authorization is terminated  or revoked sooner.       Influenza A by PCR NEGATIVE NEGATIVE Final   Influenza B by PCR NEGATIVE NEGATIVE Final    Comment: (NOTE) The Xpert Xpress SARS-CoV-2/FLU/RSV plus assay is intended as an aid in the diagnosis of influenza from Nasopharyngeal swab specimens and should not be used as a sole basis for treatment. Nasal washings and aspirates are unacceptable for Xpert Xpress SARS-CoV-2/FLU/RSV testing.  Fact Sheet for Patients: bloggercourse.com  Fact Sheet for Healthcare  Providers: seriousbroker.it  This test is not yet approved or cleared by the [place]  FDA and has been authorized for detection and/or diagnosis of SARS-CoV-2 by FDA under an Emergency Use Authorization (EUA). This EUA will remain in effect (meaning this test can be used) for the duration of the COVID-19 declaration under Section 564(b)(1) of the Act, 21 U.S.C. section 360bbb-3(b)(1), unless the authorization is terminated or revoked.     Resp Syncytial Virus by PCR NEGATIVE NEGATIVE Final    Comment: (NOTE) Fact Sheet for Patients: bloggercourse.com  Fact Sheet for Healthcare Providers: seriousbroker.it  This test is not yet approved or cleared by the [place]  FDA and has been authorized for detection and/or diagnosis of SARS-CoV-2 by FDA under an Emergency Use Authorization (EUA). This EUA will remain in effect (meaning this test can be used) for the duration of the COVID-19 declaration under Section 564(b)(1) of the Act, 21 U.S.C. section 360bbb-3(b)(1), unless the authorization is terminated or revoked.  Performed at [facility], [address] [place], [place] [address]     Radiology Studies: [facility] Chest [facility] 1 View Result Date: 10/26/2024 CLINICAL DATA:  Dyspnea and respiratory abnormalities. EXAM: PORTABLE CHEST 1 VIEW COMPARISON:  10/23/2024 FINDINGS: Cardiomegaly is again seen. Improvement in vascular congestion. Improving retrocardiac opacity with residual bandlike opacities. Oval scarring in the periphery of the right lower lobe. No significant pleural effusion. No pneumothorax. IMPRESSION: 1. Improving vascular congestion. 2. Improving retrocardiac opacity with residual bandlike opacities, likely atelectasis. 3. Cardiomegaly. Electronically Signed   By: Andrea Gasman M.D.   On: 10/26/2024 10:54   Scheduled Meds:  azithromycin   500 mg Oral Daily   budesonide -glycopyrrolate -formoterol    2 puff Inhalation BID   enoxaparin  (LOVENOX ) injection  60 mg Subcutaneous Q24H   furosemide   40 mg Intravenous BID   insulin  aspart  0-5 Units Subcutaneous QHS   insulin  aspart  0-9 Units Subcutaneous TID WC   ipratropium-albuterol   3 mL Nebulization TID   methylPREDNISolone  (SOLU-MEDROL ) injection  40 mg Intravenous Q12H   pregabalin   100 mg Oral BID   Continuous Infusions:   LOS: 3 days   Rendall Carwin M.D on 10/26/2024 at 5:34 PM  Go to [email or web address] - for contact info  [facility] - Office  [phone]  If 7PM-7AM, please contact night-coverage [email or web address] 10/26/2024, 5:34 PM    "

## 2024-10-26 NOTE — Plan of Care (Signed)
   Problem: Activity: Goal: Risk for activity intolerance will decrease Outcome: Progressing   Problem: Coping: Goal: Level of anxiety will decrease Outcome: Progressing

## 2024-10-26 NOTE — Plan of Care (Signed)
  Problem: Education: Goal: Knowledge of General Education information will improve Description: Including pain rating scale, medication(s)/side effects and non-pharmacologic comfort measures Outcome: Progressing   Problem: Health Behavior/Discharge Planning: Goal: Ability to manage health-related needs will improve Outcome: Progressing   Problem: Clinical Measurements: Goal: Ability to maintain clinical measurements within normal limits will improve Outcome: Progressing Goal: Will remain free from infection Outcome: Progressing Goal: Diagnostic test results will improve Outcome: Progressing Goal: Respiratory complications will improve Outcome: Progressing Goal: Cardiovascular complication will be avoided Outcome: Progressing   Problem: Activity: Goal: Risk for activity intolerance will decrease Outcome: Progressing   Problem: Nutrition: Goal: Adequate nutrition will be maintained Outcome: Progressing   Problem: Coping: Goal: Level of anxiety will decrease Outcome: Progressing   Problem: Elimination: Goal: Will not experience complications related to bowel motility Outcome: Progressing Goal: Will not experience complications related to urinary retention Outcome: Progressing   Problem: Pain Managment: Goal: General experience of comfort will improve and/or be controlled Outcome: Progressing   Problem: Safety: Goal: Ability to remain free from injury will improve Outcome: Progressing   Problem: Skin Integrity: Goal: Risk for impaired skin integrity will decrease Outcome: Progressing   Problem: Education: Goal: Ability to demonstrate management of disease process will improve Outcome: Progressing Goal: Ability to verbalize understanding of medication therapies will improve Outcome: Progressing Goal: Individualized Educational Video(s) Outcome: Progressing   Problem: Activity: Goal: Capacity to carry out activities will improve Outcome: Progressing    Problem: Cardiac: Goal: Ability to achieve and maintain adequate cardiopulmonary perfusion will improve Outcome: Progressing   Problem: Education: Goal: Knowledge of disease or condition will improve Outcome: Progressing Goal: Knowledge of the prescribed therapeutic regimen will improve Outcome: Progressing Goal: Individualized Educational Video(s) Outcome: Progressing   Problem: Activity: Goal: Ability to tolerate increased activity will improve Outcome: Progressing Goal: Will verbalize the importance of balancing activity with adequate rest periods Outcome: Progressing   Problem: Respiratory: Goal: Ability to maintain a clear airway will improve Outcome: Progressing Goal: Levels of oxygenation will improve Outcome: Progressing Goal: Ability to maintain adequate ventilation will improve Outcome: Progressing   Problem: Education: Goal: Ability to describe self-care measures that may prevent or decrease complications (Diabetes Survival Skills Education) will improve Outcome: Progressing Goal: Individualized Educational Video(s) Outcome: Progressing   Problem: Coping: Goal: Ability to adjust to condition or change in health will improve Outcome: Progressing   Problem: Fluid Volume: Goal: Ability to maintain a balanced intake and output will improve Outcome: Progressing   Problem: Health Behavior/Discharge Planning: Goal: Ability to identify and utilize available resources and services will improve Outcome: Progressing Goal: Ability to manage health-related needs will improve Outcome: Progressing   Problem: Metabolic: Goal: Ability to maintain appropriate glucose levels will improve Outcome: Progressing   Problem: Nutritional: Goal: Maintenance of adequate nutrition will improve Outcome: Progressing Goal: Progress toward achieving an optimal weight will improve Outcome: Progressing   Problem: Skin Integrity: Goal: Risk for impaired skin integrity will  decrease Outcome: Progressing   Problem: Tissue Perfusion: Goal: Adequacy of tissue perfusion will improve Outcome: Progressing

## 2024-10-27 DIAGNOSIS — J9621 Acute and chronic respiratory failure with hypoxia: Secondary | ICD-10-CM | POA: Diagnosis not present

## 2024-10-27 LAB — GLUCOSE, CAPILLARY
Glucose-Capillary: 124 mg/dL — ABNORMAL HIGH (ref 70–99)
Glucose-Capillary: 153 mg/dL — ABNORMAL HIGH (ref 70–99)

## 2024-10-27 MED ORDER — ALBUTEROL SULFATE HFA 108 (90 BASE) MCG/ACT IN AERS: 2.0000 | INHALATION_SPRAY | RESPIRATORY_TRACT | 3 refills | Status: AC | PRN

## 2024-10-27 MED ORDER — GUAIFENESIN ER 600 MG PO TB12: 600.0000 mg | ORAL_TABLET | Freq: Two times a day (BID) | ORAL | 0 refills | Status: DC

## 2024-10-27 MED ORDER — FUROSEMIDE 40 MG PO TABS: 40.0000 mg | ORAL_TABLET | Freq: Every day | ORAL | 2 refills | Status: AC

## 2024-10-27 MED ORDER — BREZTRI AEROSPHERE 160-9-4.8 MCG/ACT IN AERO: 2.0000 | INHALATION_SPRAY | Freq: Two times a day (BID) | RESPIRATORY_TRACT | 3 refills | Status: AC

## 2024-10-27 MED ORDER — ACETAMINOPHEN 325 MG PO TABS: 650.0000 mg | ORAL_TABLET | Freq: Four times a day (QID) | ORAL | Status: AC | PRN

## 2024-10-27 MED ORDER — OMEPRAZOLE 40 MG PO CPDR: 40.0000 mg | DELAYED_RELEASE_CAPSULE | Freq: Two times a day (BID) | ORAL | 2 refills | Status: AC

## 2024-10-27 MED ORDER — ALBUTEROL SULFATE (2.5 MG/3ML) 0.083% IN NEBU: 2.5000 mg | INHALATION_SOLUTION | RESPIRATORY_TRACT | 2 refills | Status: AC | PRN

## 2024-10-27 MED ORDER — AZITHROMYCIN 500 MG PO TABS: 500.0000 mg | ORAL_TABLET | Freq: Every day | ORAL | 0 refills | Status: AC

## 2024-10-27 MED ORDER — BENZONATATE 100 MG PO CAPS: 100.0000 mg | ORAL_CAPSULE | Freq: Three times a day (TID) | ORAL | 0 refills | Status: AC | PRN

## 2024-10-27 MED ORDER — PREDNISONE 20 MG PO TABS: 40.0000 mg | ORAL_TABLET | Freq: Every day | ORAL | 0 refills | Status: AC

## 2024-10-27 NOTE — Progress Notes (Signed)
" ° °  Brief Progress Note   _____________________________________________________________________________________________________________  Patient Name: MICO SPARK Patient DOB: 04/06/1961 Date: @TODAY @         Action: Waiting for transportation back to [facility], facility will pick up this after noon      _____________________________________________________________________________________________________________  The [facility] RN Expeditor Ronal DELENA Bald Please contact us  directly via secure chat (search for [facility]) or by calling us  at [phone] [facility]).  "

## 2024-10-27 NOTE — Discharge Instructions (Signed)
 1)Very Low-salt diet advised---Less than 2 gm of Sodium per day advised----ok to use Mrs DASH salt substitute instead of Salt 2)Weigh yourself daily, call if you gain more than 3 pounds in 1 day or more than 5 pounds in 1 week as your diuretic medications may need to be adjusted 3)you need oxygen  at home at 4 L via nasal cannula continuously while awake and while asleep--- smoking or having open fires around oxygen  can cause fire, significant injury and death 4)Repeat CBC and BMP Blood Test on Friday 10/31/24

## 2024-10-27 NOTE — Discharge Summary (Signed)
 "                                                                                   Miguel Marsh, is a 63 y.o. male  DOB [birth year]  MRN [id number].  Admission date:  10/23/2024  Admitting Physician  Tully FORBES Carwin, MD  Discharge Date:  10/27/2024   Primary MD  Renato Dorothey HERO, NP  Recommendations for primary care physician for things to follow:  1)Very Low-salt diet advised---Less than 2 gm of Sodium per day advised----ok to use Mrs DASH salt substitute instead of Salt 2)Weigh yourself daily, call if you gain more than 3 pounds in 1 day or more than 5 pounds in 1 week as your diuretic medications may need to be adjusted 3)you need oxygen  at home at 4 L via nasal cannula continuously while awake and while asleep--- smoking or having open fires around oxygen  can cause fire, significant injury and death 4)Repeat CBC and BMP Blood Test on Friday 10/31/24  Admission Diagnosis  Hypercapnia [R06.89] COPD exacerbation (HCC) [J44.1] Acute on chronic hypoxic respiratory failure (HCC) [J96.21]   Discharge Diagnosis  Hypercapnia [R06.89] COPD exacerbation (HCC) [J44.1] Acute on chronic hypoxic respiratory failure (HCC) [J96.21]    Principal Problem:   Acute on chronic hypoxic respiratory failure (HCC) Active Problems:   Essential hypertension   Obesity, Class III, BMI 40-49.9 (morbid obesity) (HCC)   Acute exacerbation of chronic obstructive pulmonary disease (COPD) (HCC)      Past Medical History:  Diagnosis Date   Arthritis    Bronchitis    Cancer (HCC)    metastatic NSCLC (followed by [facility])   COPD (chronic obstructive pulmonary disease) (HCC)    HTN (hypertension)     Past Surgical History:  Procedure Laterality Date   LUNG BIOPSY     TOTAL HIP ARTHROPLASTY     TUMOR REMOVAL Right 12/04/2017     HPI  from the history and physical done on the day of admission:   Chief Complaint: Difficulty breathing   HPI: Miguel Marsh is a 63 y.o. male with medical history  significant for COPD, hypertension, chronic respiratory failure on 4 L, cocaine use. Patient presented to the ED with complaints of difficulty breathing of 2 to 3 days duration, also cough productive of greenish phlegm this is about 2 to 3 weeks ago.  No chest pain.  He reports lower extremity swelling and abdominal distention.  Reports compliance with Lasix  20 mg daily.   ED Course: Temperature 98.3.  Heart rate 81-100.  Respiratory rate 16-24.  Blood pressure systolic 100-139.  O2 sats 97% on 4 L, with ambulation O2 sats dropped to 85%. Chest x-ray shows pulmonary vascular congestion. proBNP 69. Troponin 24 >> 18. VBG shows pH of 7.34, mild elevated pCO2 of 94.  Serum bicarb greater than 45. BiPAP initially ordered in ED due to elevated pCO2. 1 L bolus given.  DuoNeb given. Hospitalist to admit for acute hypoxic respiratory failure.   Review of Systems: As per HPI all other systems reviewed and negative.   Hospital Course:   Brief Narrative:  63 y.o. male with medical history significant for COPD, hypertension, chronic respiratory  failure on 4 L, cocaine use Admitted on 10/24/2023 with acute on chronic hypoxic respiratory failure with concerns for possible COPD exacerbation/CHF exacerbation     -Assessment and Plan: 1)Acute on chronic COPD exacerbation--- patient is a reformed smoker -he quit smoking about 3 months ago -VBG consistent with chronic CO2 retention, mostly compensated hypercapnic respiratory failure -Flu, RSV and COVID-negative - Much improved from a respiratory standpoint after treatment with IV Solu-Medrol , azithromycin  bronchodilators and mucolytic's - Get to discharge back to group home on azithromycin ,  bronchodilators mucolytic's and prednisone    2)Mild Acute on chronic Diastolic CHF exacerbation--echo from 10/18/2024 with EF of 50 to 50% with grade 1 diastolic dysfunction -proBNP 69 similar to prior -Chest x-ray consistent with pulmonary venous congestion without  overt pulmonary edema - Treated with IV Lasix , repeat CXR on 10/26/24 shows Improved Vascular Congestion..... -Okay to discharge on Lasix  40 mg daily -Discontinue HCTZ   3)Acute on chronic hypoxic and hypercapnic respiratory failure--due to #1 and #2 above -Overall much improved and oxygen  requirement is back to baseline which is 4 L/min   4)HTN--stable, stop  hydrochlorothiazide  - IV hydralazine  as needed elevated BP   5)Disposition--patient is from a group home, discharged back to group home   Disposition: The patient is from: Group home              Anticipated d/c is to: Group home  Discharge Condition: Stable,  Follow UP   Follow-up Information     Renato Dorothey HERO, NP. Schedule an appointment as soon as possible for a visit in 1 week(s).   Specialty: Internal Medicine Contact information: [phone]  [address] [place] [address] [phone]                 Diet and Activity recommendation:  As advised  Discharge Instructions    Discharge Instructions     Call MD for:  difficulty breathing, headache or visual disturbances   Complete by: As directed    Call MD for:  persistant dizziness or light-headedness   Complete by: As directed    Call MD for:  persistant nausea and vomiting   Complete by: As directed    Call MD for:  temperature >100.4   Complete by: As directed    Diet - low sodium heart healthy   Complete by: As directed    Discharge instructions   Complete by: As directed    1)Very Low-salt diet advised---Less than 2 gm of Sodium per day advised----ok to use Mrs DASH salt substitute instead of Salt 2)Weigh yourself daily, call if you gain more than 3 pounds in 1 day or more than 5 pounds in 1 week as your diuretic medications may need to be adjusted 3)you need oxygen  at home at 4 L via nasal cannula continuously while awake and while asleep--- smoking or having open fires around oxygen  can cause fire, significant injury and death 4)Repeat CBC and  BMP Blood Test on Friday 10/31/24   Increase activity slowly   Complete by: As directed        Discharge Medications     Allergies as of 10/27/2024       Reactions   Ibuprofen  Shortness Of Breath        Medication List     STOP taking these medications    hydrochlorothiazide  25 MG tablet Commonly known as: HYDRODIURIL    oxyCODONE  15 MG immediate release tablet Commonly known as: ROXICODONE        TAKE these medications  acetaminophen  325 MG tablet Commonly known as: TYLENOL  Take 2 tablets (650 mg total) by mouth every 6 (six) hours as needed for mild pain (pain score 1-3) or fever (or Fever >/= 101). What changed: reasons to take this   albuterol  (2.5 MG/3ML) 0.083% nebulizer solution Commonly known as: PROVENTIL  Inhale 3 mLs (2.5 mg total) into the lungs every 4 (four) hours as needed for wheezing or shortness of breath. What changed: when to take this   albuterol  108 (90 Base) MCG/ACT inhaler Commonly known as: VENTOLIN  HFA Inhale 2 puffs into the lungs every 4 (four) hours as needed for shortness of breath or wheezing (Cough). What changed: when to take this   azithromycin  500 MG tablet Commonly known as: ZITHROMAX  Take 1 tablet (500 mg total) by mouth daily for 3 days.   benzonatate  100 MG capsule Commonly known as: TESSALON  Take 1 capsule (100 mg total) by mouth 3 (three) times daily as needed for cough.   Breztri  Aerosphere 160-9-4.8 MCG/ACT Aero inhaler Generic drug: budesonide -glycopyrrolate -formoterol  Inhale 2 puffs into the lungs 2 (two) times daily.   clonazePAM  1 MG tablet Commonly known as: KLONOPIN  Take 1 tablet (1 mg total) by mouth 2 (two) times daily as needed for anxiety.   furosemide  40 MG tablet Commonly known as: Lasix  Take 1 tablet (40 mg total) by mouth daily. What changed:  medication strength how much to take   guaiFENesin  600 MG 12 hr tablet Commonly known as: Mucinex  Take 1 tablet (600 mg total) by mouth 2 (two)  times daily.   HYDROcodone -acetaminophen  7.5-325 MG tablet Commonly known as: NORCO Take 1 tablet by mouth every 4 (four) hours as needed for moderate pain (pain score 4-6). What changed: Another medication with the same name was removed. Continue taking this medication, and follow the directions you see here.   omeprazole  40 MG capsule Commonly known as: PRILOSEC Take 1 capsule (40 mg total) by mouth in the morning and at bedtime.   OXYGEN  Inhale 4 L into the lungs continuous.   prednisoLONE acetate 1 % ophthalmic suspension Commonly known as: PRED FORTE SMARTSIG:In Eye(s)   predniSONE  20 MG tablet Commonly known as: DELTASONE  Take 2 tablets (40 mg total) by mouth daily with breakfast for 5 days.   pregabalin  100 MG capsule Commonly known as: LYRICA  Take 100 mg by mouth 2 (two) times daily.   Vitamin D (Ergocalciferol) 1.25 MG (50000 UNIT) Caps capsule Commonly known as: DRISDOL Take 50,000 Units by mouth once a week.       Major procedures and Radiology Reports - PLEASE review detailed and final reports for all details, in brief -   DG Chest Port 1 View Result Date: 10/26/2024 CLINICAL DATA:  Dyspnea and respiratory abnormalities. EXAM: PORTABLE CHEST 1 VIEW COMPARISON:  10/23/2024 FINDINGS: Cardiomegaly is again seen. Improvement in vascular congestion. Improving retrocardiac opacity with residual bandlike opacities. Oval scarring in the periphery of the right lower lobe. No significant pleural effusion. No pneumothorax. IMPRESSION: 1. Improving vascular congestion. 2. Improving retrocardiac opacity with residual bandlike opacities, likely atelectasis. 3. Cardiomegaly. Electronically Signed   By: Andrea Gasman M.D.   On: 10/26/2024 10:54   ECHOCARDIOGRAM COMPLETE Result Date: 10/24/2024    ECHOCARDIOGRAM REPORT   Patient Name:   Miguel Marsh Date of Exam: 10/24/2024 Medical Rec #:  [id number]        Height:       66.0 in Accession #:    [id number]       Weight:  270.1 lb Date of Birth:  09/18/1961        BSA:          2.272 m Patient Age:    63 years         BP:           112/61 mmHg Patient Gender: M                HR:           81 bpm. Exam Location:  [facility] Procedure: 2D Echo, Cardiac Doppler, Color Doppler and Strain Analysis (Both            Spectral and Color Flow Doppler were utilized during procedure). Indications:    Acute on chronic hypoxic respiratory failure (HCC)  History:        Patient has prior history of Echocardiogram examinations, most                 recent 03/06/2023. COPD; Risk Factors:Hypertension and Obesity. Hx                 of cocaine abuse and COVID-19.  Sonographer:    Aida Pizza RCS Referring Phys: [phone] TULLY FORBES CARWIN  Sonographer Comments: Global longitudinal strain was attempted. IMPRESSIONS  1. Left ventricular ejection fraction, by estimation, is 55 to 60%. The left ventricle has normal function. Left ventricular endocardial border not optimally defined to evaluate regional wall motion. Left ventricular diastolic parameters are consistent with Grade I diastolic dysfunction (impaired relaxation). The average left ventricular global longitudinal strain is -9.6 %.  2. Right ventricular systolic function was not well visualized. The right ventricular size is normal. Tricuspid regurgitation signal is inadequate for assessing PA pressure.  3. The mitral valve is grossly normal. No evidence of mitral valve regurgitation. No evidence of mitral stenosis.  4. The aortic valve was not well visualized. Aortic valve regurgitation is not visualized. No aortic stenosis is present.  5. The inferior vena cava is normal in size with greater than 50% respiratory variability, suggesting right atrial pressure of 3 mmHg. Comparison(s): No significant change from prior study. FINDINGS  Left Ventricle: Left ventricular ejection fraction, by estimation, is 55 to 60%. The left ventricle has normal function. Left ventricular endocardial border not  optimally defined to evaluate regional wall motion. The average left ventricular global longitudinal strain is -9.6 %. Strain was performed and the global longitudinal strain is indeterminate. The left ventricular internal cavity size was normal in size. There is no left ventricular hypertrophy. Left ventricular diastolic parameters are consistent with Grade I diastolic dysfunction (impaired relaxation). Normal left ventricular filling pressure. Right Ventricle: The right ventricular size is normal. No increase in right ventricular wall thickness. Right ventricular systolic function was not well visualized. Tricuspid regurgitation signal is inadequate for assessing PA pressure. Left Atrium: Left atrial size was normal in size. Right Atrium: Right atrial size was normal in size. Pericardium: There is no evidence of pericardial effusion. Mitral Valve: The mitral valve is grossly normal. No evidence of mitral valve regurgitation. No evidence of mitral valve stenosis. Tricuspid Valve: The tricuspid valve is not well visualized. Tricuspid valve regurgitation is not demonstrated. No evidence of tricuspid stenosis. Aortic Valve: The aortic valve was not well visualized. Aortic valve regurgitation is not visualized. No aortic stenosis is present. Pulmonic Valve: The pulmonic valve was not well visualized. Pulmonic valve regurgitation is not visualized. No evidence of pulmonic stenosis. Aorta: The aortic root is normal in size and structure. Venous:  The inferior vena cava is normal in size with greater than 50% respiratory variability, suggesting right atrial pressure of 3 mmHg. IAS/Shunts: No atrial level shunt detected by color flow Doppler. Additional Comments: 3D was performed not requiring image post processing on an independent workstation and was indeterminate.  LEFT VENTRICLE PLAX 2D LVIDd:         4.60 cm      Diastology LVIDs:         3.20 cm      LV e' medial:    11.70 cm/s LV PW:         1.10 cm      LV E/e'  medial:  9.1 LV IVS:        1.00 cm      LV e' lateral:   12.60 cm/s LVOT diam:     2.00 cm      LV E/e' lateral: 8.5 LV SV:         92 LV SV Index:   41           2D Longitudinal Strain LVOT Area:     3.14 cm     2D Strain GLS Avg:     -9.6 %  LV Volumes (MOD) LV vol d, MOD A2C: 107.0 ml LV vol d, MOD A4C: 92.6 ml LV vol s, MOD A2C: 44.7 ml LV vol s, MOD A4C: 36.3 ml LV SV MOD A2C:     62.3 ml LV SV MOD A4C:     92.6 ml LV SV MOD BP:      59.8 ml RIGHT VENTRICLE RV S prime:     18.50 cm/s TAPSE (M-mode): 2.9 cm LEFT ATRIUM             Index        RIGHT ATRIUM           Index LA diam:        3.00 cm 1.32 cm/m   RA Area:     16.90 cm LA Vol (A2C):   58.6 ml 25.79 ml/m  RA Volume:   46.00 ml  20.25 ml/m LA Vol (A4C):   53.0 ml 23.33 ml/m LA Biplane Vol: 57.5 ml 25.31 ml/m  AORTIC VALVE LVOT Vmax:   165.00 cm/s LVOT Vmean:  101.000 cm/s LVOT VTI:    0.293 m  AORTA Ao Root diam: 3.40 cm MITRAL VALVE MV Area (PHT): 3.77 cm     SHUNTS MV Decel Time: 201 msec     Systemic VTI:  0.29 m MV E velocity: 107.00 cm/s  Systemic Diam: 2.00 cm MV A velocity: 112.00 cm/s MV E/A ratio:  0.96 Miguel Marsh Electronically signed by Diannah Late Marsh Signature Date/Time: 10/24/2024/2:49:50 PM    Final    DG Chest Portable 1 View Result Date: 10/23/2024 CLINICAL DATA:  Shortness of breath. EXAM: PORTABLE CHEST 1 VIEW COMPARISON:  [facility] 05/18/2024 FINDINGS: The cardio pericardial silhouette is enlarged. There is pulmonary vascular congestion without overt pulmonary edema. Streaky density in the parahilar left lung is stable with evidence of retrocardiac left base collapse/consolidation. Small left effusion not excluded. Telemetry leads overlie the chest. IMPRESSION: 1. Enlargement of the cardiopericardial silhouette with pulmonary vascular congestion. 2. Retrocardiac left base collapse/consolidation with possible small left effusion. Electronically Signed   By: Camellia Candle M.D.   On: 10/23/2024 12:39    Micro Results  Recent Results (from the past 240 hours)  Resp panel by RT-PCR (RSV, Flu A&B, Covid) Anterior Nasal Swab  Status: None   Collection Time: 10/24/24  3:23 AM   Specimen: Anterior Nasal Swab  Result Value Ref Range Status   SARS Coronavirus 2 by RT PCR NEGATIVE NEGATIVE Final    Comment: (NOTE) SARS-CoV-2 target nucleic acids are NOT DETECTED.  The SARS-CoV-2 RNA is generally detectable in upper respiratory specimens during the acute phase of infection. The lowest concentration of SARS-CoV-2 viral copies this assay can detect is 138 copies/mL. A negative result does not preclude SARS-Cov-2 infection and should not be used as the sole basis for treatment or other patient management decisions. A negative result may occur with  improper specimen collection/handling, submission of specimen other than nasopharyngeal swab, presence of viral mutation(s) within the areas targeted by this assay, and inadequate number of viral copies(<138 copies/mL). A negative result must be combined with clinical observations, patient history, and epidemiological information. The expected result is Negative.  Fact Sheet for Patients:  bloggercourse.com  Fact Sheet for Healthcare Providers:  seriousbroker.it  This test is no t yet approved or cleared by the [place]  FDA and  has been authorized for detection and/or diagnosis of SARS-CoV-2 by FDA under an Emergency Use Authorization (EUA). This EUA will remain  in effect (meaning this test can be used) for the duration of the COVID-19 declaration under Section 564(b)(1) of the Act, 21 U.S.C.section 360bbb-3(b)(1), unless the authorization is terminated  or revoked sooner.       Influenza A by PCR NEGATIVE NEGATIVE Final   Influenza B by PCR NEGATIVE NEGATIVE Final    Comment: (NOTE) The Xpert Xpress SARS-CoV-2/FLU/RSV plus assay is intended as an aid in the diagnosis of  influenza from Nasopharyngeal swab specimens and should not be used as a sole basis for treatment. Nasal washings and aspirates are unacceptable for Xpert Xpress SARS-CoV-2/FLU/RSV testing.  Fact Sheet for Patients: bloggercourse.com  Fact Sheet for Healthcare Providers: seriousbroker.it  This test is not yet approved or cleared by the [place]  FDA and has been authorized for detection and/or diagnosis of SARS-CoV-2 by FDA under an Emergency Use Authorization (EUA). This EUA will remain in effect (meaning this test can be used) for the duration of the COVID-19 declaration under Section 564(b)(1) of the Act, 21 U.S.C. section 360bbb-3(b)(1), unless the authorization is terminated or revoked.     Resp Syncytial Virus by PCR NEGATIVE NEGATIVE Final    Comment: (NOTE) Fact Sheet for Patients: bloggercourse.com  Fact Sheet for Healthcare Providers: seriousbroker.it  This test is not yet approved or cleared by the [place]  FDA and has been authorized for detection and/or diagnosis of SARS-CoV-2 by FDA under an Emergency Use Authorization (EUA). This EUA will remain in effect (meaning this test can be used) for the duration of the COVID-19 declaration under Section 564(b)(1) of the Act, 21 U.S.C. section 360bbb-3(b)(1), unless the authorization is terminated or revoked.  Performed at [facility], [address] [place], [place] [address]     Today   Subjective    Miguel Marsh today has no new complaints -Voiding well -Respiratory status has improved significantly with IV Lasix  and steroids -Oxygen  requirement is back to baseline of 4 L per nasal cannula -No fever  Or chills   No Nausea, Vomiting or Diarrhea   Patient has been seen and examined prior to discharge   Objective   Blood pressure 108/70, pulse 80, temperature 97.6 F (36.4 C), temperature source  Oral, resp. rate 20, height 5' 6 (1.676 m), weight 121.5 kg, SpO2 100%.  Exam  Gen:- Awake Alert, no acute distress  HEENT:- [place].AT, No sclera icterus Nose- [place] 4L/min Neck-Supple Neck,No JVD,.  Lungs-improved air movement, no wheezing  CV- S1, S2 normal, regular Abd-  +ve B.Sounds, Abd Soft, No tenderness,    Extremity/Skin:-Resolved edema,   good pulses Psych-affect is appropriate, oriented x3 Neuro-no new focal deficits, no tremors    Data Review   CBC w Diff:  Lab Results  Component Value Date   WBC 6.8 10/23/2024   HGB 11.3 (L) 10/23/2024   HCT 37.8 (L) 10/23/2024   PLT 215 10/23/2024   LYMPHOPCT 26 10/23/2024   MONOPCT 14 10/23/2024   EOSPCT 2 10/23/2024   BASOPCT 0 10/23/2024    CMP:  Lab Results  Component Value Date   NA 142 10/25/2024   K 4.0 10/25/2024   CL 95 (L) 10/25/2024   CO2 >45 (H) 10/25/2024   BUN 18 10/25/2024   CREATININE 0.82 10/25/2024   PROT 6.7 10/23/2024   ALBUMIN 3.9 10/23/2024   BILITOT 0.3 10/23/2024   ALKPHOS 82 10/23/2024   AST 21 10/23/2024   ALT 12 10/23/2024  .  Total Discharge time is about 33 minutes  Rendall Carwin M.D on 10/27/2024 at 12:44 PM  Go to [email or web address] -  for contact info  [facility] - Office  [phone]   "

## 2024-10-27 NOTE — TOC Transition Note (Signed)
 Transition of Care [facility]) - Discharge Note   Patient Details  Name: Miguel Marsh MRN: [id number] Date of Birth: 1961/01/27  [facility] CM/SW Contact:  Mcarthur Saddie Kim, LCSW Phone Number: 10/27/2024, 1:31 PM   Clinical Narrative:  Pt d/c today back to [facility]. Pt's brother notified. Facility will pick up pt this afternoon. D/C summary and FL2 sent to facility. RN updated.      Final next level of care: Group Home Barriers to Discharge: Barriers Resolved   Patient Goals and CMS Choice Patient states their goals for this hospitalization and ongoing recovery are:: Return to ALF          Discharge Placement                Patient to be transferred to facility by: facility [facility]eta Name of family member notified: brother Patient and family notified of of transfer: 10/27/24  Discharge Plan and Services Additional resources added to the After Visit Summary for                                       Social Drivers of Health (SDOH) Interventions SDOH Screenings   Food Insecurity: No Food Insecurity (10/24/2024)  Housing: Low Risk (10/24/2024)  Transportation Needs: No Transportation Needs (10/24/2024)  Utilities: Not At Risk (10/24/2024)  Social Connections: Moderately Integrated (10/24/2024)  Tobacco Use: High Risk (10/23/2024)     Readmission Risk Interventions    10/24/2024    2:07 PM 07/12/2024    4:22 PM 05/20/2024    9:57 AM  Readmission Risk Prevention Plan  Transportation Screening Complete Complete Complete  Medication Review Oceanographer) Complete Complete Complete  PCP or Specialist appointment within 3-5 days of discharge Not Complete Complete   HRI or Home Care Consult Complete Complete Complete  SW Recovery Care/Counseling Consult  Complete Complete  Palliative Care Screening Not Applicable Not Applicable Not Applicable  Skilled Nursing Facility Not Applicable Not Applicable Not Applicable

## 2024-10-27 NOTE — [place] FL2 (Signed)
 " Miguel Marsh  MEDICAID FL2 LEVEL OF CARE FORM     IDENTIFICATION  Patient Name: Miguel Marsh Birthdate: 03/16/1961 Sex: male Admission Date (Current Location): 10/23/2024  [place] and [place] Number:  Miguel Marsh [id number] Facility and Address:  [facility],  [address], [place] [address]      Provider Number: [id number]  Attending Physician Name and Address:  Pearlean Manus, MD  Relative Name and Phone Number:       Current Level of Care: Hospital Recommended Level of Care: [facility] Prior Approval Number:    Date Approved/Denied:   PASRR Number:    Discharge Plan: Other (Comment) (Family Care Home)    Current Diagnoses: Patient Active Problem List   Diagnosis Date Noted   Acute on chronic hypoxic respiratory failure (HCC) 10/23/2024   Anxiety 01/15/2024   Cocaine abuse (HCC) 09/13/2023   CAP (community acquired pneumonia) 06/24/2023   Lung nodule 06/24/2023   COVID-19 virus infection 06/24/2023   Elevated MCV 03/06/2023   Acute metabolic encephalopathy 02/01/2023   Leukocytosis 10/06/2020   Goals of care, counseling/discussion    Palliative care by specialist    Encounter for smoking cessation counseling    Hyperglycemia 04/12/2020   Chronic respiratory failure with hypoxia and hypercapnia (HCC) 01/22/2020   Acute on chronic respiratory failure with hypoxia and hypercapnia (HCC) 01/23/2018   Obesity, Class III, BMI 40-49.9 (morbid obesity) (HCC) 01/01/2018   Acute exacerbation of chronic obstructive pulmonary disease (COPD) (HCC) 01/01/2018   Essential hypertension 12/09/2015   Non-small cell cancer of right lung (in remission) 12/09/2015   GERD (gastroesophageal reflux disease) 12/09/2015   Tobacco use disorder 12/17/2014   COPD exacerbation (HCC) 12/17/2014   Obesity 12/17/2014   Dyspnea    Difficulty walking 04/02/2013   Hip weakness 04/02/2013    Orientation RESPIRATION BLADDER Height & Weight     Self, Time, Place,  Situation  O2 (4L) Continent Weight: 267 lb 13.7 oz (121.5 kg) Height:  5' 6 (167.6 cm)  BEHAVIORAL SYMPTOMS/MOOD NEUROLOGICAL BOWEL NUTRITION STATUS      Continent Diet (low sodium heart healthy)  AMBULATORY STATUS COMMUNICATION OF NEEDS Skin   Limited Assist Verbally Normal                       Personal Care Assistance Level of Assistance  Bathing, Feeding, Dressing Bathing Assistance: Limited assistance Feeding assistance: Limited assistance Dressing Assistance: Limited assistance     Functional Limitations Info  Sight, Speech, Hearing Sight Info: Adequate Hearing Info: Adequate Speech Info: Adequate    SPECIAL CARE FACTORS FREQUENCY                       Contractures Contractures Info: Not present    Additional Factors Info  Code Status, Allergies Code Status Info: FULL Allergies Info: Ibuprofen            Current Medications (10/27/2024):  This is the current hospital active medication list Current Facility-Administered Medications  Medication Dose Route Frequency Provider Last Rate Last Admin   acetaminophen  (TYLENOL ) tablet 650 mg  650 mg Oral Q6H PRN Emokpae, Ejiroghene E, MD       Or   acetaminophen  (TYLENOL ) suppository 650 mg  650 mg Rectal Q6H PRN Emokpae, Ejiroghene E, MD       azithromycin  (ZITHROMAX ) tablet 500 mg  500 mg Oral Daily Emokpae, Courage, MD   500 mg at 10/26/24 1838   budesonide -glycopyrrolate -formoterol  (BREZTRI ) 160-9-4.8 MCG/ACT inhaler 2 puff  2 puff Inhalation BID Pearlean Manus, MD   2 puff at 10/27/24 0745   clonazePAM  (KLONOPIN ) tablet 1 mg  1 mg Oral BID PRN Pearlean Manus, MD   1 mg at 10/26/24 2128   enoxaparin  (LOVENOX ) injection 60 mg  60 mg Subcutaneous Q24H Emokpae, Ejiroghene E, MD   60 mg at 10/26/24 2122   furosemide  (LASIX ) injection 40 mg  40 mg Intravenous BID Emokpae, Ejiroghene E, MD   40 mg at 10/27/24 9141   hydrALAZINE  (APRESOLINE ) injection 10 mg  10 mg Intravenous Q6H PRN Emokpae, Courage, MD        HYDROcodone -acetaminophen  (NORCO/VICODIN) 5-325 MG per tablet 1 tablet  1 tablet Oral BID PRN Emokpae, Ejiroghene E, MD   1 tablet at 10/23/24 2220   insulin  aspart (novoLOG ) injection 0-5 Units  0-5 Units Subcutaneous QHS Emokpae, Ejiroghene E, MD   2 Units at 10/23/24 2247   insulin  aspart (novoLOG ) injection 0-9 Units  0-9 Units Subcutaneous TID WC Emokpae, Ejiroghene E, MD   2 Units at 10/27/24 1226   ipratropium-albuterol  (DUONEB) 0.5-2.5 (3) MG/3ML nebulizer solution 3 mL  3 mL Nebulization Q4H PRN Emokpae, Ejiroghene E, MD       ipratropium-albuterol  (DUONEB) 0.5-2.5 (3) MG/3ML nebulizer solution 3 mL  3 mL Nebulization TID Emokpae, Ejiroghene E, MD   3 mL at 10/27/24 9257   methylPREDNISolone  sodium succinate (SOLU-MEDROL ) 40 mg/mL injection 40 mg  40 mg Intravenous Q12H Emokpae, Courage, MD   40 mg at 10/27/24 0859   ondansetron  (ZOFRAN ) tablet 4 mg  4 mg Oral Q6H PRN Emokpae, Ejiroghene E, MD       Or   ondansetron  (ZOFRAN ) injection 4 mg  4 mg Intravenous Q6H PRN Emokpae, Ejiroghene E, MD       Oral care mouth rinse  15 mL Mouth Rinse PRN Emokpae, Courage, MD       polyethylene glycol (MIRALAX  / GLYCOLAX ) packet 17 g  17 g Oral Daily PRN Emokpae, Ejiroghene E, MD       pregabalin  (LYRICA ) capsule 100 mg  100 mg Oral BID Emokpae, Ejiroghene E, MD   100 mg at 10/27/24 0859     Discharge Medications: Medication List       STOP taking these medications     hydrochlorothiazide  25 MG tablet Commonly known as: HYDRODIURIL     oxyCODONE  15 MG immediate release tablet Commonly known as: ROXICODONE            TAKE these medications     acetaminophen  325 MG tablet Commonly known as: TYLENOL  Take 2 tablets (650 mg total) by mouth every 6 (six) hours as needed for mild pain (pain score 1-3) or fever (or Fever >/= 101). What changed: reasons to take this    albuterol  (2.5 MG/3ML) 0.083% nebulizer solution Commonly known as: PROVENTIL  Inhale 3 mLs (2.5 mg total) into the lungs  every 4 (four) hours as needed for wheezing or shortness of breath. What changed: when to take this    albuterol  108 (90 Base) MCG/ACT inhaler Commonly known as: VENTOLIN  HFA Inhale 2 puffs into the lungs every 4 (four) hours as needed for shortness of breath or wheezing (Cough). What changed: when to take this    azithromycin  500 MG tablet Commonly known as: ZITHROMAX  Take 1 tablet (500 mg total) by mouth daily for 3 days.    benzonatate  100 MG capsule Commonly known as: TESSALON  Take 1 capsule (100 mg total) by mouth 3 (three) times daily as needed for cough.  Breztri  Aerosphere 160-9-4.8 MCG/ACT Aero inhaler Generic drug: budesonide -glycopyrrolate -formoterol  Inhale 2 puffs into the lungs 2 (two) times daily.    clonazePAM  1 MG tablet Commonly known as: KLONOPIN  Take 1 tablet (1 mg total) by mouth 2 (two) times daily as needed for anxiety.    furosemide  40 MG tablet Commonly known as: Lasix  Take 1 tablet (40 mg total) by mouth daily. What changed:  medication strength how much to take    guaiFENesin  600 MG 12 hr tablet Commonly known as: Mucinex  Take 1 tablet (600 mg total) by mouth 2 (two) times daily.    HYDROcodone -acetaminophen  7.5-325 MG tablet Commonly known as: NORCO Take 1 tablet by mouth every 4 (four) hours as needed for moderate pain (pain score 4-6). What changed: Another medication with the same name was removed. Continue taking this medication, and follow the directions you see here.    omeprazole  40 MG capsule Commonly known as: PRILOSEC Take 1 capsule (40 mg total) by mouth in the morning and at bedtime.    OXYGEN  Inhale 4 L into the lungs continuous.    prednisoLONE acetate 1 % ophthalmic suspension Commonly known as: PRED FORTE SMARTSIG:In Eye(s)    predniSONE  20 MG tablet Commonly known as: DELTASONE  Take 2 tablets (40 mg total) by mouth daily with breakfast for 5 days.    pregabalin  100 MG capsule Commonly known as: LYRICA  Take 100 mg  by mouth 2 (two) times daily.    Vitamin D (Ergocalciferol) 1.25 MG (50000 UNIT) Caps capsule Commonly known as: DRISDOL Take 50,000 Units by mouth once a week.    Relevant Imaging Results:  Relevant Lab Results:   Additional Information SSN: [id number] [place], [place]     "

## 2024-10-27 NOTE — Care Management Important Message (Signed)
 Important Message  Patient Details  Name: Miguel Marsh MRN: [id number] Date of Birth: 08/08/1961   Important Message Given:  Yes - Medicare IM     Graylen Noboa L Maryelizabeth Eberle 10/27/2024, 11:53 AM

## 2024-10-29 ENCOUNTER — Ambulatory Visit: Attending: Internal Medicine | Admitting: Internal Medicine

## 2024-10-29 NOTE — Progress Notes (Deleted)
" °  Cardiology Office Note:  .   Date:  10/29/2024  ID:  Miguel Marsh, DOB 27-Aug-1961, MRN [id number] PCP: Renato Dorothey HERO, NP  [facility] Providers Cardiologist:  None { Click to update primary MD,subspecialty MD or APP then REFRESH:1}    History of Present Illness: Miguel Marsh is a 63 y.o. male with history of hypertension, COPD, chronic respiratory failure on 4 L of supplemental oxygen , metastatic non-small cell lung cancer, and cocaine use, who has been referred for evaluation of chest pain by Dr. Kahoano.  He has had 7 ED visits over the last 6 months, of which, for have led to admissions at [facility] primarily due to COPD exacerbation.  He presented to the [facility] on 09/25/2024 with 45 minutes of chest pain.  High-sensitivity troponin T was minimally elevated and flat (29 -> 26).  EKG showed normal sinus rhythm with poor R wave progression.  He was discharged home but readmitted on 10/23/2024 due to increasing shortness of breath and productive cough.  He was hospitalized for 4 days for management of acute on chronic respiratory failure with hypoxia due to COPD exacerbation, having been discharged 2 days ago.  He also noted some leg edema and abdominal distention thought to be due to an element of HFpEF for which he was diuresed and discharged on furosemide  40 mg daily.  ROS: See HPI  Studies Reviewed: SABRA        TTE (10/24/2024): Normal LV size and wall thickness.  LVEF 55-60% with grade 1 diastolic dysfunction.  GLS -9.6%.  Normal RV size.  RV function not well visualized.  Normal biatrial size.  No pericardial effusion.  No significant valvular abnormality.  Normal CVP.  Risk Assessment/Calculations:   {Does this patient have ATRIAL FIBRILLATION?:[id number]} No BP recorded.  {Refresh Note OR Click here to enter BP  :1}***       Physical Exam:   VS:  There were no vitals taken for this visit.   Wt Readings from Last 3 Encounters:  10/27/24 267 lb 13.7 oz  (121.5 kg)  09/25/24 271 lb 2.7 oz (123 kg)  09/13/24 270 lb 4.5 oz (122.6 kg)    General:  NAD. Neck: No JVD or HJR. Lungs: Clear to auscultation bilaterally without wheezes or crackles. Heart: Regular rate and rhythm without murmurs, rubs, or gallops. Abdomen: Soft, nontender, nondistended. Extremities: No lower extremity edema.  ASSESSMENT AND PLAN: .    ***    {Are you ordering a CV Procedure (e.g. stress test, cath, DCCV, TEE, etc)?   Press F2        :[id number]}  Dispo: ***  Signed, Lonni Hanson, MD  "

## 2024-11-11 ENCOUNTER — Inpatient Hospital Stay (HOSPITAL_COMMUNITY)
Admission: EM | Admit: 2024-11-11 | Discharge: 2024-11-14 | DRG: 191 | Disposition: A | Discharge status: Home / Self Care | Attending: Internal Medicine | Admitting: Internal Medicine

## 2024-11-11 ENCOUNTER — Other Ambulatory Visit: Payer: Self-pay

## 2024-11-11 ENCOUNTER — Encounter (HOSPITAL_COMMUNITY): Payer: Self-pay | Admitting: Emergency Medicine

## 2024-11-11 ENCOUNTER — Emergency Department (HOSPITAL_COMMUNITY)

## 2024-11-11 DIAGNOSIS — Z7951 Long term (current) use of inhaled steroids: Secondary | ICD-10-CM

## 2024-11-11 DIAGNOSIS — Z1152 Encounter for screening for COVID-19: Secondary | ICD-10-CM

## 2024-11-11 DIAGNOSIS — Z9981 Dependence on supplemental oxygen: Secondary | ICD-10-CM

## 2024-11-11 DIAGNOSIS — F17210 Nicotine dependence, cigarettes, uncomplicated: Secondary | ICD-10-CM | POA: Diagnosis present

## 2024-11-11 DIAGNOSIS — J441 Chronic obstructive pulmonary disease with (acute) exacerbation: Principal | ICD-10-CM | POA: Diagnosis present

## 2024-11-11 DIAGNOSIS — J9611 Chronic respiratory failure with hypoxia: Secondary | ICD-10-CM | POA: Diagnosis present

## 2024-11-11 DIAGNOSIS — E66813 Obesity, class 3: Secondary | ICD-10-CM | POA: Diagnosis present

## 2024-11-11 DIAGNOSIS — Z85118 Personal history of other malignant neoplasm of bronchus and lung: Secondary | ICD-10-CM

## 2024-11-11 DIAGNOSIS — Z886 Allergy status to analgesic agent status: Secondary | ICD-10-CM

## 2024-11-11 DIAGNOSIS — J9612 Chronic respiratory failure with hypercapnia: Secondary | ICD-10-CM | POA: Diagnosis present

## 2024-11-11 DIAGNOSIS — Z6841 Body Mass Index (BMI) 40.0 and over, adult: Secondary | ICD-10-CM

## 2024-11-11 DIAGNOSIS — I110 Hypertensive heart disease with heart failure: Secondary | ICD-10-CM | POA: Diagnosis present

## 2024-11-11 DIAGNOSIS — Z79899 Other long term (current) drug therapy: Secondary | ICD-10-CM

## 2024-11-11 DIAGNOSIS — T17890A Other foreign object in other parts of respiratory tract causing asphyxiation, initial encounter: Secondary | ICD-10-CM | POA: Diagnosis present

## 2024-11-11 DIAGNOSIS — I10 Essential (primary) hypertension: Secondary | ICD-10-CM | POA: Diagnosis present

## 2024-11-11 DIAGNOSIS — I5032 Chronic diastolic (congestive) heart failure: Secondary | ICD-10-CM | POA: Diagnosis present

## 2024-11-11 DIAGNOSIS — Z96649 Presence of unspecified artificial hip joint: Secondary | ICD-10-CM | POA: Diagnosis present

## 2024-11-11 LAB — COMPREHENSIVE METABOLIC PANEL WITH GFR
ALT: 15 U/L (ref 0–44)
AST: 20 U/L (ref 15–41)
Albumin: 4.1 g/dL (ref 3.5–5.0)
Alkaline Phosphatase: 98 U/L (ref 38–126)
BUN: 14 mg/dL (ref 8–23)
CO2: >45 mmol/L — ABNORMAL HIGH (ref 22–32)
Calcium: 9 mg/dL (ref 8.9–10.3)
Chloride: 96 mmol/L — ABNORMAL LOW (ref 98–111)
Creatinine, Ser: 0.92 mg/dL (ref 0.61–1.24)
GFR, Estimated: >60 mL/min
Glucose, Bld: 106 mg/dL — ABNORMAL HIGH (ref 70–99)
Potassium: 3.7 mmol/L (ref 3.5–5.1)
Sodium: 143 mmol/L (ref 135–145)
Total Bilirubin: 0.3 mg/dL (ref 0.0–1.2)
Total Protein: 7.2 g/dL (ref 6.5–8.1)

## 2024-11-11 LAB — CBC WITH DIFFERENTIAL/PLATELET
Abs Immature Granulocytes: 0.03 K/uL (ref 0.00–0.07)
Basophils Absolute: 0 K/uL (ref 0.0–0.1)
Basophils Relative: 0 %
Eosinophils Absolute: 0.2 K/uL (ref 0.0–0.5)
Eosinophils Relative: 3 %
HCT: 39.9 % (ref 39.0–52.0)
Hemoglobin: 12.4 g/dL — ABNORMAL LOW (ref 13.0–17.0)
Immature Granulocytes: 0 %
Lymphocytes Relative: 25 %
Lymphs Abs: 2 K/uL (ref 0.7–4.0)
MCH: 29.3 pg (ref 26.0–34.0)
MCHC: 31.1 g/dL (ref 30.0–36.0)
MCV: 94.3 fL (ref 80.0–100.0)
Monocytes Absolute: 1.1 K/uL — ABNORMAL HIGH (ref 0.1–1.0)
Monocytes Relative: 14 %
Neutro Abs: 4.7 K/uL (ref 1.7–7.7)
Neutrophils Relative %: 58 %
Platelets: 165 K/uL (ref 150–400)
RBC: 4.23 MIL/uL (ref 4.22–5.81)
RDW: 12 % (ref 11.5–15.5)
WBC: 8.1 K/uL (ref 4.0–10.5)
nRBC: 0 % (ref 0.0–0.2)

## 2024-11-11 LAB — RESP PANEL BY RT-PCR (RSV, FLU A&B, COVID)  RVPGX2
Influenza A by PCR: NEGATIVE
Influenza B by PCR: NEGATIVE
Resp Syncytial Virus by PCR: NEGATIVE
SARS Coronavirus 2 by RT PCR: NEGATIVE

## 2024-11-11 LAB — TROPONIN T, HIGH SENSITIVITY
Troponin T High Sensitivity: 24 ng/L — ABNORMAL HIGH (ref 0–19)
Troponin T High Sensitivity: 21 ng/L — ABNORMAL HIGH (ref 0–19)

## 2024-11-11 LAB — PRO BRAIN NATRIURETIC PEPTIDE: Pro Brain Natriuretic Peptide: <50 pg/mL

## 2024-11-11 MED ORDER — SODIUM CHLORIDE 0.9 % IV SOLN
2.0000 g | Freq: Once | INTRAVENOUS | Status: AC
  Administered 2024-11-11: 2 g via INTRAVENOUS
  Filled 2024-11-11: qty 20

## 2024-11-11 MED ORDER — METHYLPREDNISOLONE SODIUM SUCC 40 MG IJ SOLR
40.0000 mg | Freq: Three times a day (TID) | INTRAMUSCULAR | Status: DC
  Administered 2024-11-11 – 2024-11-12 (×3): 40 mg via INTRAVENOUS
  Filled 2024-11-11 (×3): qty 1

## 2024-11-11 MED ORDER — METHYLPREDNISOLONE SODIUM SUCC 125 MG IJ SOLR
125.0000 mg | Freq: Once | INTRAMUSCULAR | Status: AC
  Administered 2024-11-11: 125 mg via INTRAVENOUS
  Filled 2024-11-11: qty 2

## 2024-11-11 MED ORDER — FUROSEMIDE 40 MG PO TABS
40.0000 mg | ORAL_TABLET | Freq: Every day | ORAL | Status: DC
  Administered 2024-11-11 – 2024-11-14 (×4): 40 mg via ORAL
  Filled 2024-11-11 (×4): qty 1

## 2024-11-11 MED ORDER — PREGABALIN 50 MG PO CAPS
100.0000 mg | ORAL_CAPSULE | Freq: Two times a day (BID) | ORAL | Status: DC
  Administered 2024-11-11 – 2024-11-14 (×6): 100 mg via ORAL
  Filled 2024-11-11 (×6): qty 2

## 2024-11-11 MED ORDER — HYDRALAZINE HCL 20 MG/ML IJ SOLN: 10.0000 mg | INTRAMUSCULAR | Status: DC | PRN

## 2024-11-11 MED ORDER — CLONAZEPAM 0.5 MG PO TABS
0.5000 mg | ORAL_TABLET | Freq: Two times a day (BID) | ORAL | Status: DC | PRN
  Administered 2024-11-11 – 2024-11-14 (×3): 0.5 mg via ORAL
  Filled 2024-11-11 (×3): qty 1

## 2024-11-11 MED ORDER — ENOXAPARIN SODIUM 60 MG/0.6ML IJ SOSY
60.0000 mg | PREFILLED_SYRINGE | INTRAMUSCULAR | Status: DC
  Administered 2024-11-11 – 2024-11-13 (×3): 60 mg via SUBCUTANEOUS
  Filled 2024-11-11 (×3): qty 0.6

## 2024-11-11 MED ORDER — BISACODYL 5 MG PO TBEC: 5.0000 mg | DELAYED_RELEASE_TABLET | Freq: Every day | ORAL | Status: DC | PRN

## 2024-11-11 MED ORDER — OXYCODONE HCL 5 MG PO TABS: 5.0000 mg | ORAL_TABLET | ORAL | Status: DC | PRN

## 2024-11-11 MED ORDER — SODIUM CHLORIDE 0.9 % IV SOLN
100.0000 mg | Freq: Once | INTRAVENOUS | Status: AC
  Administered 2024-11-11: 100 mg via INTRAVENOUS
  Filled 2024-11-11 (×2): qty 100

## 2024-11-11 MED ORDER — SODIUM CHLORIDE 0.9 % IV SOLN
2.0000 g | INTRAVENOUS | Status: DC
  Administered 2024-11-12 – 2024-11-14 (×3): 2 g via INTRAVENOUS
  Filled 2024-11-11 (×4): qty 20

## 2024-11-11 MED ORDER — METOPROLOL TARTRATE 5 MG/5ML IV SOLN: 10.0000 mg | INTRAVENOUS | Status: DC | PRN

## 2024-11-11 MED ORDER — ACETAMINOPHEN 325 MG PO TABS: 650.0000 mg | ORAL_TABLET | Freq: Four times a day (QID) | ORAL | Status: DC | PRN

## 2024-11-11 MED ORDER — GUAIFENESIN 100 MG/5ML PO LIQD: 5.0000 mL | ORAL | Status: DC | PRN

## 2024-11-11 MED ORDER — ONDANSETRON HCL 4 MG/2ML IJ SOLN: 4.0000 mg | Freq: Four times a day (QID) | INTRAMUSCULAR | Status: DC | PRN

## 2024-11-11 MED ORDER — IOHEXOL 350 MG/ML SOLN
75.0000 mL | Freq: Once | INTRAVENOUS | Status: AC | PRN
  Administered 2024-11-11: 75 mL via INTRAVENOUS

## 2024-11-11 MED ORDER — ACETAMINOPHEN 650 MG RE SUPP: 650.0000 mg | Freq: Four times a day (QID) | RECTAL | Status: DC | PRN

## 2024-11-11 MED ORDER — ALBUTEROL SULFATE (2.5 MG/3ML) 0.083% IN NEBU
2.5000 mg | INHALATION_SOLUTION | Freq: Once | RESPIRATORY_TRACT | Status: AC
  Administered 2024-11-11: 2.5 mg via RESPIRATORY_TRACT
  Filled 2024-11-11: qty 3

## 2024-11-11 MED ORDER — GUAIFENESIN ER 600 MG PO TB12
1200.0000 mg | ORAL_TABLET | Freq: Two times a day (BID) | ORAL | Status: DC
  Administered 2024-11-11 – 2024-11-14 (×7): 1200 mg via ORAL
  Filled 2024-11-11 (×7): qty 2

## 2024-11-11 MED ORDER — IPRATROPIUM-ALBUTEROL 0.5-2.5 (3) MG/3ML IN SOLN
3.0000 mL | Freq: Four times a day (QID) | RESPIRATORY_TRACT | Status: DC
  Administered 2024-11-11 – 2024-11-14 (×9): 3 mL via RESPIRATORY_TRACT
  Filled 2024-11-11 (×9): qty 3

## 2024-11-11 MED ORDER — SODIUM CHLORIDE 0.9 % IV SOLN
100.0000 mg | Freq: Two times a day (BID) | INTRAVENOUS | Status: DC
  Administered 2024-11-11 – 2024-11-12 (×2): 100 mg via INTRAVENOUS
  Filled 2024-11-11 (×4): qty 100

## 2024-11-11 MED ORDER — ONDANSETRON HCL 4 MG PO TABS: 4.0000 mg | ORAL_TABLET | Freq: Four times a day (QID) | ORAL | Status: DC | PRN

## 2024-11-11 MED ORDER — SENNOSIDES-DOCUSATE SODIUM 8.6-50 MG PO TABS: 1.0000 | ORAL_TABLET | Freq: Every evening | ORAL | Status: DC | PRN

## 2024-11-11 MED ORDER — IPRATROPIUM-ALBUTEROL 0.5-2.5 (3) MG/3ML IN SOLN
3.0000 mL | RESPIRATORY_TRACT | Status: AC
  Administered 2024-11-11 (×3): 3 mL via RESPIRATORY_TRACT
  Filled 2024-11-11: qty 6
  Filled 2024-11-11: qty 3

## 2024-11-11 MED ORDER — ENOXAPARIN SODIUM 40 MG/0.4ML IJ SOSY: 40.0000 mg | PREFILLED_SYRINGE | INTRAMUSCULAR | Status: DC

## 2024-11-11 MED ORDER — IPRATROPIUM-ALBUTEROL 0.5-2.5 (3) MG/3ML IN SOLN: 3.0000 mL | RESPIRATORY_TRACT | Status: DC | PRN

## 2024-11-11 MED ORDER — GLUCAGON HCL RDNA (DIAGNOSTIC) 1 MG IJ SOLR: 1.0000 mg | INTRAMUSCULAR | Status: DC | PRN

## 2024-11-11 MED ORDER — PANTOPRAZOLE SODIUM 40 MG PO TBEC
40.0000 mg | DELAYED_RELEASE_TABLET | Freq: Every day | ORAL | Status: DC
  Administered 2024-11-12 – 2024-11-14 (×3): 40 mg via ORAL
  Filled 2024-11-11 (×3): qty 1

## 2024-11-11 MED ORDER — NICOTINE 21 MG/24HR TD PT24: 21.0000 mg | MEDICATED_PATCH | Freq: Every day | TRANSDERMAL | Status: DC | PRN

## 2024-11-11 MED ORDER — BUDESONIDE 0.5 MG/2ML IN SUSP
0.5000 mg | Freq: Two times a day (BID) | RESPIRATORY_TRACT | Status: DC
  Administered 2024-11-11 – 2024-11-14 (×6): 0.5 mg via RESPIRATORY_TRACT
  Filled 2024-11-11 (×6): qty 2

## 2024-11-11 NOTE — ED Provider Notes (Signed)
 " [facility] AT [facility] Provider Note   CSN: [id number] Arrival date & time: 11/11/24  9495     Patient presents with: Shortness of Breath   Miguel Marsh is a 64 y.o. male.   3 to 4 days of progressively worsening cough and shortness of breath.  Patient states he has a history of COPD and wears 4 L at baseline.  Reportedly when EMS got there is on his concentrator and his sats were low.  They put him on 4 L on their tank and his oxygen  improved significantly.  Patient endorses productive cough.  Denies any known fevers.  States has been 1 or 2 sick people at the place that he lives but he is not sure if he is exposed to them. States some abdominal swelling, no leg edema.    Shortness of Breath      Prior to Admission medications  Medication Sig Start Date End Date Taking? Authorizing Provider  acetaminophen  (TYLENOL ) 325 MG tablet Take 2 tablets (650 mg total) by mouth every 6 (six) hours as needed for mild pain (pain score 1-3) or fever (or Fever >/= 101). 10/27/24   Pearlean Manus, MD  albuterol  (PROVENTIL ) (2.5 MG/3ML) 0.083% nebulizer solution Inhale 3 mLs (2.5 mg total) into the lungs every 4 (four) hours as needed for wheezing or shortness of breath. 10/27/24   Pearlean Manus, MD  albuterol  (VENTOLIN  HFA) 108 (90 Base) MCG/ACT inhaler Inhale 2 puffs into the lungs every 4 (four) hours as needed for shortness of breath or wheezing (Cough). 10/27/24   Pearlean Manus, MD  benzonatate  (TESSALON ) 100 MG capsule Take 1 capsule (100 mg total) by mouth 3 (three) times daily as needed for cough. 10/27/24   Pearlean Manus, MD  BREZTRI  AEROSPHERE 160-9-4.8 MCG/ACT AERO inhaler Inhale 2 puffs into the lungs 2 (two) times daily. 10/27/24   Pearlean Manus, MD  clonazePAM  (KLONOPIN ) 1 MG tablet Take 1 tablet (1 mg total) by mouth 2 (two) times daily as needed for anxiety. 11/01/23   Pearlean Manus, MD  furosemide  (LASIX ) 40 MG tablet Take 1 tablet  (40 mg total) by mouth daily. 10/27/24 10/27/25  Pearlean Manus, MD  guaiFENesin  (MUCINEX ) 600 MG 12 hr tablet Take 1 tablet (600 mg total) by mouth 2 (two) times daily. 10/27/24   Pearlean Manus, MD  HYDROcodone -acetaminophen  (NORCO) 7.5-325 MG tablet Take 1 tablet by mouth every 4 (four) hours as needed for moderate pain (pain score 4-6). 10/11/24   [provider]  omeprazole  (PRILOSEC) 40 MG capsule Take 1 capsule (40 mg total) by mouth in the morning and at bedtime. 10/27/24   Pearlean Manus, MD  OXYGEN  Inhale 4 L into the lungs continuous.    [provider]  prednisoLONE acetate (PRED FORTE) 1 % ophthalmic suspension SMARTSIG:In Eye(s) 09/12/24   [provider]  pregabalin  (LYRICA ) 100 MG capsule Take 100 mg by mouth 2 (two) times daily. 09/08/24   [provider]  Vitamin D, Ergocalciferol, (DRISDOL) 1.25 MG (50000 UNIT) CAPS capsule Take 50,000 Units by mouth once a week. 09/12/24   [provider]    Allergies: Ibuprofen     Review of Systems  Respiratory:  Positive for shortness of breath.     Updated Vital Signs BP 106/67   Pulse 79   Temp 98.2 F (36.8 C) (Oral)   Resp (!) 27   Ht 5' 6 (1.676 m)   Wt 122.5 kg   SpO2 96%   BMI  43.58 kg/m   Physical Exam Vitals and nursing note reviewed.  Constitutional:      Appearance: He is well-developed.  HENT:     Head: Normocephalic and atraumatic.  Cardiovascular:     Rate and Rhythm: Normal rate.  Pulmonary:     Effort: Pulmonary effort is normal. No respiratory distress.     Breath sounds: Decreased breath sounds and rales present.  Abdominal:     General: There is distension.  Musculoskeletal:        General: Normal range of motion.     Cervical back: Normal range of motion.     Right lower leg: No edema.     Left lower leg: No edema.  Neurological:     Mental Status: He is alert.     (all labs ordered are listed, but only abnormal results are displayed) Labs  Reviewed  CBC WITH DIFFERENTIAL/PLATELET  COMPREHENSIVE METABOLIC PANEL WITH GFR  PRO BRAIN NATRIURETIC PEPTIDE  TROPONIN T, HIGH SENSITIVITY    EKG: EKG Interpretation Date/Time:  Tuesday November 11 2024 05:13:53 EST Ventricular Rate:  87 PR Interval:  62 QRS Duration:  94 QT Interval:  386 QTC Calculation: 465 R Axis:   44  Text Interpretation: Sinus rhythm Short PR interval Borderline low voltage, extremity leads Confirmed by Lorette Mayo [phone]) on 11/11/2024 5:18:41 AM  Radiology: No results found.   Procedures   Medications Ordered in the ED - No data to display                                  Medical Decision Making Amount and/or Complexity of Data Reviewed Labs: ordered. Radiology: ordered. ECG/medicine tests: ordered.  Risk Prescription drug management. Decision regarding hospitalization.   Likely COPD exacerbation.  Patient getting breathing treatments without a lot of improvement so CTA was ordered.  Patient may need admission for his work of breathing however is on his baseline oxygen .  He is awaiting a second troponin first  one was reassuring.  Care transferred pending completion of workup, reevaluation and ultimate disposition.    Lorette Mayo, MD 11/20/24 2316  "

## 2024-11-11 NOTE — ED Triage Notes (Signed)
 Pt bib [facility] from [facility]. Pt has hx of lung Ca. & COPD Pt wears 4L of O2 at baseline. Sats were 86% on pts Concentrator at facility. Pt placed on EMS o2 of 4L and sats came up to 96%. Pt states he has been feeling bad for a few days.

## 2024-11-11 NOTE — ED Provider Notes (Signed)
 Care of patient assumed from Dr. Lorette.  This patient presents for worsening shortness of breath over the past 3 days.  He has had productive cough.  EMS noted hypoxia on his baseline 4 L of oxygen .  This did resolve after patient was placed on EMS oxygen .  His exam has been consistent with COPD exacerbation.  He is awaiting delta troponin and CTA chest.  He will require reassessment. Physical Exam  BP 117/74   Pulse 70   Temp 98.2 F (36.8 C) (Oral)   Resp 14   Ht 5' 6 (1.676 m)   Wt 122.5 kg   SpO2 97%   BMI 43.58 kg/m   Physical Exam Vitals and nursing note reviewed.  Constitutional:      General: He is not in acute distress.    Appearance: He is well-developed. He is not ill-appearing, toxic-appearing or diaphoretic.  HENT:     Head: Normocephalic and atraumatic.     Mouth/Throat:     Mouth: Mucous membranes are moist.  Eyes:     Conjunctiva/sclera: Conjunctivae normal.  Cardiovascular:     Rate and Rhythm: Normal rate and regular rhythm.  Pulmonary:     Effort: No respiratory distress.     Breath sounds: Wheezing and rhonchi present.  Abdominal:     Palpations: Abdomen is soft.     Tenderness: There is no abdominal tenderness.  Musculoskeletal:        General: No swelling. Normal range of motion.     Cervical back: Normal range of motion and neck supple.  Skin:    General: Skin is warm and dry.     Coloration: Skin is not cyanotic or pale.  Neurological:     General: No focal deficit present.     Mental Status: He is alert and oriented to person, place, and time.  Psychiatric:        Mood and Affect: Mood normal.        Behavior: Behavior normal.     Procedures  Procedures  ED Course / MDM    Medical Decision Making Amount and/or Complexity of Data Reviewed Labs: ordered. Radiology: ordered. ECG/medicine tests: ordered.  Risk Prescription drug management.   CTA did not show any evidence of PE.  He does have bronchial wall thickening with some  mucous plugging present in the right lower lobe.  On assessment, patient is resting comfortably.  He does have noticeable shortness of breath with just conversation.  Given his ongoing symptoms, will admit for further management.       Melvenia Motto, MD 11/11/24 1004

## 2024-11-11 NOTE — H&P (Signed)
 " History and Physical    Miguel Marsh FMW:[id number] DOB: 04-Jun-1961 DOA: 11/11/2024  PCP: Renato Dorothey HERO, NP Patient coming from: Home  Chief Complaint: Short of breath  HPI: Miguel Marsh is a 64 y.o. male with medical history significant of COPD, chronic hypoxia 4 L nasal cannula, cocaine use, COPD, diastolic CHF with EF 50%, HTN comes to the hospital with complaints of shortness of breath.  Patient states he felt slightly short of breath last night but this morning even with minimal exertion he was getting winded.  He checked his oxygen  and on 4 L he was noted to be in 60% therefore came to the hospital.  Denies any nausea or vomiting, fevers, chills or recent sick contact since his discharge from the hospital on 10/27/2024.  In the ER patient was found to be in COPD exacerbation therefore started on bronchodilators, steroids and antibiotics.   Review of Systems: As per HPI otherwise 10 point review of systems negative.  Review of Systems Otherwise negative except as per HPI, including: General: Denies fever, chills, night sweats or unintended weight loss. Resp: Denies hemoptysis Cardiac: Denies chest pain, palpitations, orthopnea, paroxysmal nocturnal dyspnea. GI: Denies abdominal pain, nausea, vomiting, diarrhea or constipation GU: Denies dysuria, frequency, hesitancy or incontinence MS: Denies muscle aches, joint pain or swelling Neuro: Denies headache, neurologic deficits (focal weakness, numbness, tingling), abnormal gait Psych: Denies anxiety, depression, SI/HI/AVH Skin: Denies new rashes or lesions ID: Denies sick contacts, exotic exposures, travel  Past Medical History:  Diagnosis Date   Arthritis    Bronchitis    Cancer (HCC)    metastatic NSCLC (followed by [facility])   COPD (chronic obstructive pulmonary disease) (HCC)    HTN (hypertension)     Past Surgical History:  Procedure Laterality Date   LUNG BIOPSY     TOTAL HIP ARTHROPLASTY     TUMOR REMOVAL Right  12/04/2017    SOCIAL HISTORY:  reports that he has been smoking cigarettes. He has been exposed to tobacco smoke. He has never used smokeless tobacco. He reports that he does not currently use alcohol . He reports that he does not currently use drugs after having used the following drugs: Marijuana and Cocaine.  Allergies[1]  FAMILY HISTORY: History reviewed. No pertinent family history.   Prior to Admission medications  Medication Sig Start Date End Date Taking? Authorizing Provider  acetaminophen  (TYLENOL ) 325 MG tablet Take 2 tablets (650 mg total) by mouth every 6 (six) hours as needed for mild pain (pain score 1-3) or fever (or Fever >/= 101). 10/27/24   Pearlean Manus, MD  albuterol  (PROVENTIL ) (2.5 MG/3ML) 0.083% nebulizer solution Inhale 3 mLs (2.5 mg total) into the lungs every 4 (four) hours as needed for wheezing or shortness of breath. 10/27/24   Pearlean Manus, MD  albuterol  (VENTOLIN  HFA) 108 (90 Base) MCG/ACT inhaler Inhale 2 puffs into the lungs every 4 (four) hours as needed for shortness of breath or wheezing (Cough). 10/27/24   Pearlean Manus, MD  benzonatate  (TESSALON ) 100 MG capsule Take 1 capsule (100 mg total) by mouth 3 (three) times daily as needed for cough. 10/27/24   Pearlean Manus, MD  BREZTRI  AEROSPHERE 160-9-4.8 MCG/ACT AERO inhaler Inhale 2 puffs into the lungs 2 (two) times daily. 10/27/24   Pearlean Manus, MD  clonazePAM  (KLONOPIN ) 1 MG tablet Take 1 tablet (1 mg total) by mouth 2 (two) times daily as needed for anxiety. 11/01/23   Pearlean Manus, MD  furosemide  (LASIX ) 40 MG tablet Take 1  tablet (40 mg total) by mouth daily. 10/27/24 10/27/25  Pearlean Manus, MD  guaiFENesin  (MUCINEX ) 600 MG 12 hr tablet Take 1 tablet (600 mg total) by mouth 2 (two) times daily. 10/27/24   Pearlean Manus, MD  HYDROcodone -acetaminophen  (NORCO) 7.5-325 MG tablet Take 1 tablet by mouth every 4 (four) hours as needed for moderate pain (pain score 4-6). 10/11/24    [provider]  omeprazole  (PRILOSEC) 40 MG capsule Take 1 capsule (40 mg total) by mouth in the morning and at bedtime. 10/27/24   Pearlean Manus, MD  OXYGEN  Inhale 4 L into the lungs continuous.    [provider]  prednisoLONE acetate (PRED FORTE) 1 % ophthalmic suspension SMARTSIG:In Eye(s) 09/12/24   [provider]  pregabalin  (LYRICA ) 100 MG capsule Take 100 mg by mouth 2 (two) times daily. 09/08/24   [provider]  Vitamin D, Ergocalciferol, (DRISDOL) 1.25 MG (50000 UNIT) CAPS capsule Take 50,000 Units by mouth once a week. 09/12/24   [provider]    Physical Exam: Vitals:   11/11/24 0930 11/11/24 1000 11/11/24 1018 11/11/24 1100  BP: 117/74 (!) 142/85  133/76  Pulse: 70 75  75  Resp: 14 20  12   Temp:   98.2 F (36.8 C)   TempSrc:   Oral   SpO2: 97% 99%  94%  Weight:      Height:          Constitutional: NAD, calm, comfortable, 4 L nasal cannula Eyes: PERRL, lids and conjunctivae normal ENMT: Mucous membranes are moist. Posterior pharynx clear of any exudate or lesions.Normal dentition.  Neck: normal, supple, no masses, no thyromegaly Respiratory: Diffuse rhonchi Cardiovascular: Regular rate and rhythm, no murmurs / rubs / gallops. No extremity edema. 2+ pedal pulses. No carotid bruits.  Abdomen: no tenderness, no masses palpated. No hepatosplenomegaly. Bowel sounds positive.  Musculoskeletal: no clubbing / cyanosis. No joint deformity upper and lower extremities. Good ROM, no contractures. Normal muscle tone.  Skin: no rashes, lesions, ulcers. No induration Neurologic: CN 2-12 grossly intact. Sensation intact, DTR normal. Strength 5/5 in all 4.  Psychiatric: Normal judgment and insight. Alert and oriented x 3. Normal mood.    Body mass index is 43.58 kg/m.      Labs on Admission: I have personally reviewed following labs and imaging studies  CBC: Recent Labs  Lab 11/11/24 0540  WBC 8.1  NEUTROABS 4.7  HGB  12.4*  HCT 39.9  MCV 94.3  PLT 165   Basic Metabolic Panel: Recent Labs  Lab 11/11/24 0540  NA 143  K 3.7  CL 96*  CO2 >45*  GLUCOSE 106*  BUN 14  CREATININE 0.92  CALCIUM 9.0   GFR: Estimated Creatinine Clearance: 101.5 mL/min (by C-G formula based on SCr of 0.92 mg/dL). Liver Function Tests: Recent Labs  Lab 11/11/24 0540  AST 20  ALT 15  ALKPHOS 98  BILITOT 0.3  PROT 7.2  ALBUMIN 4.1   No results for input(s): LIPASE, AMYLASE in the last 168 hours. No results for input(s): AMMONIA in the last 168 hours. Coagulation Profile: No results for input(s): INR, PROTIME in the last 168 hours. Cardiac Enzymes: No results for input(s): CKTOTAL, CKMB, CKMBINDEX, TROPONINI in the last 168 hours. BNP (last 3 results) Recent Labs    09/13/24 0324 10/23/24 1206 11/11/24 0540  PROBNP 69.3 69.0 <50.0   HbA1C: No results for input(s): HGBA1C in the last 72 hours. CBG: No results for input(s): GLUCAP in the last 168 hours. Lipid  Profile: No results for input(s): CHOL, HDL, LDLCALC, TRIG, CHOLHDL, LDLDIRECT in the last 72 hours. Thyroid Function Tests: No results for input(s): TSH, T4TOTAL, FREET4, T3FREE, THYROIDAB in the last 72 hours. Anemia Panel: No results for input(s): VITAMINB12, FOLATE, FERRITIN, TIBC, IRON, RETICCTPCT in the last 72 hours. Urine analysis:    Component Value Date/Time   COLORURINE YELLOW 08/03/2024 1038   APPEARANCEUR CLEAR 08/03/2024 1038   LABSPEC 1.018 08/03/2024 1038   PHURINE 6.0 08/03/2024 1038   GLUCOSEU NEGATIVE 08/03/2024 1038   HGBUR NEGATIVE 08/03/2024 1038   BILIRUBINUR NEGATIVE 08/03/2024 1038   KETONESUR NEGATIVE 08/03/2024 1038   PROTEINUR NEGATIVE 08/03/2024 1038   UROBILINOGEN 0.2 10/21/2013 0200   NITRITE NEGATIVE 08/03/2024 1038   LEUKOCYTESUR NEGATIVE 08/03/2024 1038   Sepsis Labs:  !!!!!!!!!!!!!!!!!!!!!!!!!!!!!!!!!!!!!!!!!!!! @LABRCNTIP (procalcitonin:4,lacticidven:4) ) Recent Results (from the past 240 hours)  Resp panel by RT-PCR (RSV, Flu A&B, Covid) Anterior Nasal Swab     Status: None   Collection Time: 11/11/24  5:40 AM   Specimen: Anterior Nasal Swab  Result Value Ref Range Status   SARS Coronavirus 2 by RT PCR NEGATIVE NEGATIVE Final    Comment: (NOTE) SARS-CoV-2 target nucleic acids are NOT DETECTED.  The SARS-CoV-2 RNA is generally detectable in upper respiratory specimens during the acute phase of infection. The lowest concentration of SARS-CoV-2 viral copies this assay can detect is 138 copies/mL. A negative result does not preclude SARS-Cov-2 infection and should not be used as the sole basis for treatment or other patient management decisions. A negative result may occur with  improper specimen collection/handling, submission of specimen other than nasopharyngeal swab, presence of viral mutation(s) within the areas targeted by this assay, and inadequate number of viral copies(<138 copies/mL). A negative result must be combined with clinical observations, patient history, and epidemiological information. The expected result is Negative.  Fact Sheet for Patients:  bloggercourse.com  Fact Sheet for Healthcare Providers:  seriousbroker.it  This test is no t yet approved or cleared by the [place]  FDA and  has been authorized for detection and/or diagnosis of SARS-CoV-2 by FDA under an Emergency Use Authorization (EUA). This EUA will remain  in effect (meaning this test can be used) for the duration of the COVID-19 declaration under Section 564(b)(1) of the Act, 21 U.S.C.section 360bbb-3(b)(1), unless the authorization is terminated  or revoked sooner.       Influenza A by PCR NEGATIVE NEGATIVE Final   Influenza B by PCR NEGATIVE NEGATIVE Final    Comment: (NOTE) The Xpert Xpress  SARS-CoV-2/FLU/RSV plus assay is intended as an aid in the diagnosis of influenza from Nasopharyngeal swab specimens and should not be used as a sole basis for treatment. Nasal washings and aspirates are unacceptable for Xpert Xpress SARS-CoV-2/FLU/RSV testing.  Fact Sheet for Patients: bloggercourse.com  Fact Sheet for Healthcare Providers: seriousbroker.it  This test is not yet approved or cleared by the [place]  FDA and has been authorized for detection and/or diagnosis of SARS-CoV-2 by FDA under an Emergency Use Authorization (EUA). This EUA will remain in effect (meaning this test can be used) for the duration of the COVID-19 declaration under Section 564(b)(1) of the Act, 21 U.S.C. section 360bbb-3(b)(1), unless the authorization is terminated or revoked.     Resp Syncytial Virus by PCR NEGATIVE NEGATIVE Final    Comment: (NOTE) Fact Sheet for Patients: bloggercourse.com  Fact Sheet for Healthcare Providers: seriousbroker.it  This test is not yet approved or cleared by the [place]  FDA and has been authorized  for detection and/or diagnosis of SARS-CoV-2 by FDA under an Emergency Use Authorization (EUA). This EUA will remain in effect (meaning this test can be used) for the duration of the COVID-19 declaration under Section 564(b)(1) of the Act, 21 U.S.C. section 360bbb-3(b)(1), unless the authorization is terminated or revoked.  Performed at [facility], [address], [place], [place] [address]      Radiological Exams on Admission: CT Angio Chest PE W and/or Wo Contrast Result Date: 11/11/2024 EXAM: CTA of the Chest with contrast for PE 11/11/2024 08:58:28 AM TECHNIQUE: CTA of the chest was performed after the administration of intravenous contrast. Multiplanar reformatted images are provided for review. MIP images are provided for review. Automated exposure  control, iterative reconstruction, and/or weight based adjustment of the mA/kV was utilized to reduce the radiation dose to as low as reasonably achievable. COMPARISON: 09/13/2024 CLINICAL HISTORY: Pulmonary embolism (PE) suspected, high prob. FINDINGS: PULMONARY ARTERIES: No pulmonary embolism. Main pulmonary artery is normal in caliber. MEDIASTINUM: The heart and pericardium demonstrate no acute abnormality. There is no acute abnormality of the thoracic aorta. Minimal atherosclerosis in the LAD. LYMPH NODES: No mediastinal, hilar or axillary lymphadenopathy. LUNGS AND PLEURA: Similar scarring in the left upper lobe and lingula. Multifocal scarring also present in the right middle and both lower lobes. Mild bronchial wall thickening with areas of mucous plugging in both lower lobes. Similar peribronchial ground glass opacities dependently in the right upper lobe. Unchanged 4 mm nodule dependently in the superior segment of the right lower lobe (axial 59). 5 mm anterior right upper lobe nodule (axial 34) is also unchanged. 4 mm nodule associated with the horizontal fissure likely an intrafissural lymph node, also unchanged. Mild centrilobular emphysema. No pleural effusion or pneumothorax. UPPER ABDOMEN: Limited images of the upper abdomen are unremarkable. SOFT TISSUES AND BONES: Multilevel thoracic osteophytosis. No acute soft tissue abnormality. IMPRESSION: 1. No pulmonary embolism. 2. Mild diffuse bronchial wall thickening. Mucous plugging present in the right lower lobe. This may reflect changes of acute or chronic bronchitis or asthma. Aspiration also remains in the differential. No superimposed lobar pneumonia. 3. Mild centrilobular emphysema. A few scattered solid nodules are present, measuring up to 5 mm. Similar peribronchial ground-glass opacities in the dependent right upper lobe (axial 48- 59). Repeat chest CT in 12 months recommended to document resolution. Electronically signed by: Rogelia Myers MD  MD 11/11/2024 09:35 AM EST RP Workstation: [id number]   DG Chest Portable 1 View Result Date: 11/11/2024 EXAM: 1 VIEW(S) XRAY OF THE CHEST 11/11/2024 05:35:00 AM COMPARISON: 10/26/2024 CLINICAL HISTORY: eval for sob FINDINGS: LUNGS AND PLEURA: Unchanged Band like opacity in left mid lung compatible with atelectasis versus fluid along the oblique fissure. Peribronchovascular opacities in bilateral lung bases. are stable No pleural effusion. No pneumothorax. HEART AND MEDIASTINUM: Mild cardiomegaly. BONES AND SOFT TISSUES: No acute osseous abnormality. IMPRESSION: 1. Peribronchovascular opacities in the bilateral lung bases. 2. Unchanged band-like opacity in the left mid lung compatible with atelectasis versus fluid along the oblique fissure. 3. Mild cardiomegaly. Electronically signed by: Waddell Calk MD MD 11/11/2024 06:11 AM EST RP Workstation: [id number]    Nutritional status  All images have been reviewed by me personally.  EKG: Independently reviewed.  Assessment/Plan Principal Problem:   COPD exacerbation (HCC) Active Problems:   Chronic respiratory failure with hypoxia and hypercapnia (HCC)   Essential hypertension   Acute on chronic hypoxic respiratory failure Acute COPD exacerbation - At home uses 4 L nasal cannula and was noted to be  in 60% O2 saturation.  With abnormal breath sounds he is being admitted to the hospital.  CTA chest is negative for PE but does show bilateral mucous plugging.  For now we will start him on Solu-Medrol , scheduled as needed bronchodilators, Mucinex , empiric antibiotics.  Supportive care.  Incentive spirometer and flutter valve. -Flu, COVID/RSV are negative  Congestive heart failure with preserved EF, 50% - No obvious signs of gross volume overload but will continue his home diuretics once his preadmission med rec is confirmed.  Intermittently may require IV but will follow his clinical course.  His proBNP has been normal but he is also  obese.  Essential hypertension IV as needed, Lasix      DVT prophylaxis: Lovenox  Code Status: Full code Family Communication:  Consults called: None Admission status: Observation  Status is: Observation The patient remains OBS appropriate and will d/c before 2 midnights.   Time Spent: 65 minutes.  >50% of the time was devoted to discussing the patients care, assessment, plan and disposition with other care givers along with counseling the patient about the risks and benefits of treatment.    Burgess JAYSON Dare MD [facility]  If 7PM-7AM, please contact night-coverage   11/11/2024, 12:02 PM     [1]  Allergies Allergen Reactions   Ibuprofen  Shortness Of Breath   "

## 2024-11-12 ENCOUNTER — Ambulatory Visit: Admitting: Internal Medicine

## 2024-11-12 DIAGNOSIS — I5032 Chronic diastolic (congestive) heart failure: Secondary | ICD-10-CM | POA: Diagnosis present

## 2024-11-12 DIAGNOSIS — Z7951 Long term (current) use of inhaled steroids: Secondary | ICD-10-CM | POA: Diagnosis not present

## 2024-11-12 DIAGNOSIS — E66813 Obesity, class 3: Secondary | ICD-10-CM | POA: Diagnosis present

## 2024-11-12 DIAGNOSIS — J9611 Chronic respiratory failure with hypoxia: Secondary | ICD-10-CM | POA: Diagnosis present

## 2024-11-12 DIAGNOSIS — I110 Hypertensive heart disease with heart failure: Secondary | ICD-10-CM | POA: Diagnosis present

## 2024-11-12 DIAGNOSIS — J441 Chronic obstructive pulmonary disease with (acute) exacerbation: Secondary | ICD-10-CM | POA: Diagnosis present

## 2024-11-12 DIAGNOSIS — R0602 Shortness of breath: Secondary | ICD-10-CM | POA: Diagnosis present

## 2024-11-12 DIAGNOSIS — Z886 Allergy status to analgesic agent status: Secondary | ICD-10-CM | POA: Diagnosis not present

## 2024-11-12 DIAGNOSIS — T17890A Other foreign object in other parts of respiratory tract causing asphyxiation, initial encounter: Secondary | ICD-10-CM | POA: Diagnosis present

## 2024-11-12 DIAGNOSIS — Z79899 Other long term (current) drug therapy: Secondary | ICD-10-CM | POA: Diagnosis not present

## 2024-11-12 DIAGNOSIS — Z6841 Body Mass Index (BMI) 40.0 and over, adult: Secondary | ICD-10-CM | POA: Diagnosis not present

## 2024-11-12 DIAGNOSIS — Z1152 Encounter for screening for COVID-19: Secondary | ICD-10-CM | POA: Diagnosis not present

## 2024-11-12 DIAGNOSIS — Z96649 Presence of unspecified artificial hip joint: Secondary | ICD-10-CM | POA: Diagnosis present

## 2024-11-12 DIAGNOSIS — Z85118 Personal history of other malignant neoplasm of bronchus and lung: Secondary | ICD-10-CM | POA: Diagnosis not present

## 2024-11-12 DIAGNOSIS — Z9981 Dependence on supplemental oxygen: Secondary | ICD-10-CM | POA: Diagnosis not present

## 2024-11-12 DIAGNOSIS — J9612 Chronic respiratory failure with hypercapnia: Secondary | ICD-10-CM | POA: Diagnosis present

## 2024-11-12 DIAGNOSIS — F17210 Nicotine dependence, cigarettes, uncomplicated: Secondary | ICD-10-CM | POA: Diagnosis present

## 2024-11-12 LAB — CBC
HCT: 35.4 % — ABNORMAL LOW (ref 39.0–52.0)
Hemoglobin: 10.9 g/dL — ABNORMAL LOW (ref 13.0–17.0)
MCH: 28.9 pg (ref 26.0–34.0)
MCHC: 30.8 g/dL (ref 30.0–36.0)
MCV: 93.9 fL (ref 80.0–100.0)
Platelets: 144 K/uL — ABNORMAL LOW (ref 150–400)
RBC: 3.77 MIL/uL — ABNORMAL LOW (ref 4.22–5.81)
RDW: 12 % (ref 11.5–15.5)
WBC: 12.7 K/uL — ABNORMAL HIGH (ref 4.0–10.5)
nRBC: 0 % (ref 0.0–0.2)

## 2024-11-12 LAB — BASIC METABOLIC PANEL WITH GFR
Anion gap: 7 (ref 5–15)
BUN: 15 mg/dL (ref 8–23)
CO2: 37 mmol/L — ABNORMAL HIGH (ref 22–32)
Calcium: 9.4 mg/dL (ref 8.9–10.3)
Chloride: 97 mmol/L — ABNORMAL LOW (ref 98–111)
Creatinine, Ser: 0.86 mg/dL (ref 0.61–1.24)
GFR, Estimated: >60 mL/min
Glucose, Bld: 198 mg/dL — ABNORMAL HIGH (ref 70–99)
Potassium: 4.3 mmol/L (ref 3.5–5.1)
Sodium: 141 mmol/L (ref 135–145)

## 2024-11-12 MED ORDER — METHYLPREDNISOLONE SODIUM SUCC 40 MG IJ SOLR
40.0000 mg | Freq: Two times a day (BID) | INTRAMUSCULAR | Status: DC
  Administered 2024-11-12 – 2024-11-14 (×4): 40 mg via INTRAVENOUS
  Filled 2024-11-12 (×4): qty 1

## 2024-11-12 MED ORDER — DOXYCYCLINE HYCLATE 100 MG PO TABS
100.0000 mg | ORAL_TABLET | Freq: Two times a day (BID) | ORAL | Status: DC
  Administered 2024-11-12 – 2024-11-14 (×4): 100 mg via ORAL
  Filled 2024-11-12 (×4): qty 1

## 2024-11-12 NOTE — Progress Notes (Signed)
" °   11/12/24 2225  BiPAP/CPAP/SIPAP  BiPAP/CPAP/SIPAP Pt Type Adult  BiPAP/CPAP/SIPAP DREAMSTATIOND  Mask Type Full face mask  Mask Size Large  Respiratory Rate 18 breaths/min  Flow Rate 5 lpm  Patient Home Machine No  Patient Home Mask No  Patient Home Tubing No  Auto Titrate Yes  Minimum cmH2O 18 cmH2O  Maximum cmH2O 4 cmH2O  Device Plugged into RED Power Outlet Yes  BiPAP/CPAP /SiPAP Vitals  Pulse Rate 78  Resp 18  SpO2 95 %  Bilateral Breath Sounds Diminished  MEWS Score/Color  MEWS Score 0  MEWS Score Color Green    "

## 2024-11-12 NOTE — TOC Initial Note (Signed)
 Transition of Care [facility]) - Initial/Assessment Note    Patient Details  Name: Miguel Marsh MRN: [id number] Date of Birth: 01-Apr-1961  [facility]) CM/SW Contact:    Hoy DELENA Bigness, LCSW Phone Number: 11/12/2024, 10:27 AM  Clinical Narrative:                 Pt a resident at [facility]. Pt reports he ambulates with a cane when at home and uses an electric wheelchair when out in the community. Pt is on 4L of O2 at baseline provided by [employer] and has a POC at bedside. Pt uses [facility] for transportation to appointments. CSW spoke with Randie at [facility] and confirmed pt is able to return at discharge and there is no need for in person assessment prior to pt returning. ICM will continue to follow.   Expected Discharge Plan: Group Home Barriers to Discharge: Continued Medical Work up   Patient Goals and CMS Choice Patient states their goals for this hospitalization and ongoing recovery are:: To return to group home CMS Medicare.gov Compare Post Acute Care list provided to:: Patient        Expected Discharge Plan and Services In-house Referral: Clinical Social Work Discharge Planning Services: NA Post Acute Care Choice: NA Living arrangements for the past 2 months: Group Home                 DME Arranged: N/A DME Agency: NA                  Prior Living Arrangements/Services Living arrangements for the past 2 months: Group Home Lives with:: Facility Resident Patient language and need for interpreter reviewed:: Yes Do you feel safe going back to the place where you live?: Yes      Need for Family Participation in Patient Care: No (Comment) Care giver support system in place?: Yes (comment) Current home services: DME (cane, electric wheelchair, 4L O2 w/ [employer]) Criminal Activity/Legal Involvement Pertinent to Current Situation/Hospitalization: No - Comment as needed  Activities of Daily Living   ADL Screening (condition at time of  admission) Independently performs ADLs?: Yes (appropriate for developmental age) Is the patient deaf or have difficulty hearing?: No Does the patient have difficulty seeing, even when wearing glasses/contacts?: No Does the patient have difficulty concentrating, remembering, or making decisions?: No  Permission Sought/Granted Permission sought to share information with : Facility Industrial/product Designer granted to share information with : Yes, Verbal Permission Granted     Permission granted to share info w AGENCY: [facility]        Emotional Assessment Appearance:: Appears stated age Attitude/Demeanor/Rapport: Engaged Affect (typically observed): Accepting Orientation: : Oriented to Self, Oriented to Place, Oriented to  Time, Oriented to Situation Alcohol  / Substance Use: Not Applicable Psych Involvement: No (comment)  Admission diagnosis:  COPD exacerbation (HCC) [J44.1] Patient Active Problem List   Diagnosis Date Noted   Acute on chronic hypoxic respiratory failure (HCC) 10/23/2024   Anxiety 01/15/2024   Cocaine abuse (HCC) 09/13/2023   CAP (community acquired pneumonia) 06/24/2023   Lung nodule 06/24/2023   COVID-19 virus infection 06/24/2023   Elevated MCV 03/06/2023   Acute metabolic encephalopathy 02/01/2023   Leukocytosis 10/06/2020   Goals of care, counseling/discussion    Palliative care by specialist    Encounter for smoking cessation counseling    Hyperglycemia 04/12/2020   Chronic respiratory failure with hypoxia and hypercapnia (HCC) 01/22/2020   Acute on chronic respiratory  failure with hypoxia and hypercapnia (HCC) 01/23/2018   Obesity, Class III, BMI 40-49.9 (morbid obesity) (HCC) 01/01/2018   Acute exacerbation of chronic obstructive pulmonary disease (COPD) (HCC) 01/01/2018   Essential hypertension 12/09/2015   Non-small cell cancer of right lung (in remission) 12/09/2015   GERD (gastroesophageal reflux disease) 12/09/2015    Tobacco use disorder 12/17/2014   COPD exacerbation (HCC) 12/17/2014   Obesity 12/17/2014   Dyspnea    Difficulty walking 04/02/2013   Hip weakness 04/02/2013   PCP:  Renato Dorothey HERO, NP Pharmacy:   [facility], [place] - [address] [place] [address] Phone: [phone] Fax: [phone]     Social Drivers of Health (SDOH) Social History: SDOH Screenings   Food Insecurity: No Food Insecurity (11/11/2024)  Housing: Low Risk (11/11/2024)  Transportation Needs: No Transportation Needs (11/11/2024)  Utilities: Not At Risk (11/11/2024)  Social Connections: Moderately Integrated (10/24/2024)  Tobacco Use: High Risk (11/11/2024)   SDOH Interventions:     Readmission Risk Interventions    10/24/2024    2:07 PM 07/12/2024    4:22 PM 05/20/2024    9:57 AM  Readmission Risk Prevention Plan  Transportation Screening Complete Complete Complete  Medication Review Oceanographer) Complete Complete Complete  PCP or Specialist appointment within 3-5 days of discharge Not Complete Complete   HRI or Home Care Consult Complete Complete Complete  SW Recovery Care/Counseling Consult  Complete Complete  Palliative Care Screening Not Applicable Not Applicable Not Applicable  Skilled Nursing Facility Not Applicable Not Applicable Not Applicable

## 2024-11-12 NOTE — Progress Notes (Signed)
 " PROGRESS NOTE    Miguel Marsh  FMW:[id number] DOB: Sep 10, 1961 DOA: 11/11/2024 PCP: Renato Dorothey HERO, NP   Brief Narrative:    Miguel Marsh is a 64 y.o. male with medical history significant of COPD, chronic hypoxia 4 L nasal cannula, cocaine use, COPD, diastolic CHF with EF 50%, HTN comes to the hospital with complaints of shortness of breath.  He was admitted for acute on chronic hypoxemic respiratory failure secondary to acute COPD exacerbation and has been started on IV steroids and breathing treatments.  Assessment & Plan:   Principal Problem:   COPD exacerbation (HCC) Active Problems:   Chronic respiratory failure with hypoxia and hypercapnia (HCC)   Essential hypertension  Assessment and Plan:   Acute on chronic hypoxic respiratory failure Acute COPD exacerbation - At home uses 4 L nasal cannula and was noted to be in 60% O2 saturation.  With abnormal breath sounds he is being admitted to the hospital.  CTA chest is negative for PE but does show bilateral mucous plugging.  For now we will start him on Solu-Medrol , scheduled as needed bronchodilators, Mucinex , empiric antibiotics.  Supportive care.  Incentive spirometer and flutter valve. -Flu, COVID/RSV are negative -Wean IV Solu-Medrol  to twice daily dosing today   Congestive heart failure with preserved EF, 50% - No obvious signs of gross volume overload but will continue his home diuretics once his preadmission med rec is confirmed.  Intermittently may require IV but will follow his clinical course.  His proBNP has been normal but he is also obese.   Essential hypertension IV as needed, Lasix   Morbid obesity, class III -BMI 43.62    DVT prophylaxis:Lovenox  Code Status: Full Family Communication: None at bedside Disposition Plan:  Status is: Observation The patient will require care spanning > 2 midnights and should be moved to inpatient because: Need for IV medications.   Consultants:   None  Procedures:  None  Antimicrobials:  Anti-infectives (From admission, onward)    Start     Dose/Rate Route Frequency Ordered Stop   11/12/24 0800  cefTRIAXone  (ROCEPHIN ) 2 g in sodium chloride  0.9 % 100 mL IVPB        2 g 200 mL/hr over 30 Minutes Intravenous Every 24 hours 11/11/24 1105 11/16/24 0759   11/11/24 2200  doxycycline  (VIBRAMYCIN ) 100 mg in sodium chloride  0.9 % 250 mL IVPB        100 mg 125 mL/hr over 120 Minutes Intravenous Every 12 hours 11/11/24 1105 11/16/24 2159   11/11/24 0630  cefTRIAXone  (ROCEPHIN ) 2 g in sodium chloride  0.9 % 100 mL IVPB        2 g 200 mL/hr over 30 Minutes Intravenous  Once 11/11/24 0622 11/11/24 0709   11/11/24 0630  doxycycline  (VIBRAMYCIN ) 100 mg in sodium chloride  0.9 % 250 mL IVPB        100 mg 125 mL/hr over 120 Minutes Intravenous  Once 11/11/24 0622 11/11/24 0913       Subjective: Patient seen and evaluated today with no new acute complaints or concerns. No acute concerns or events noted overnight.  He states that he is still quite short of breath when ambulating and continues to have a dry cough that is nonproductive.  Objective: Vitals:   11/12/24 0438 11/12/24 0458 11/12/24 0735 11/12/24 0810  BP:  (!) 130/57  134/72  Pulse:  79  80  Resp:  18    Temp:  (!) 97.5 F (36.4 C)  97.7 F (36.5 C)  TempSrc:  Oral  Oral  SpO2:  98% 98% 100%  Weight: 122.6 kg     Height:        Intake/Output Summary (Last 24 hours) at 11/12/2024 1221 Last data filed at 11/12/2024 0600 Gross per 24 hour  Intake 250.73 ml  Output --  Net 250.73 ml   Filed Weights   11/11/24 0516 11/11/24 1325 11/12/24 0438  Weight: 122.5 kg 123.1 kg 122.6 kg    Examination:  General exam: Appears calm and comfortable, morbidly obese Respiratory system: Mild wheezing bilaterally, respiratory effort normal.  Currently on 4 L nasal cannula Cardiovascular system: S1 & S2 heard, RRR.  Gastrointestinal system: Abdomen is soft Central nervous system:  Alert and awake Extremities: No edema Skin: No significant lesions noted Psychiatry: Flat affect.    Data Reviewed: I have personally reviewed following labs and imaging studies  CBC: Recent Labs  Lab 11/11/24 0540 11/12/24 0453  WBC 8.1 12.7*  NEUTROABS 4.7  --   HGB 12.4* 10.9*  HCT 39.9 35.4*  MCV 94.3 93.9  PLT 165 144*   Basic Metabolic Panel: Recent Labs  Lab 11/11/24 0540 11/12/24 0453  NA 143 141  K 3.7 4.3  CL 96* 97*  CO2 >45* 37*  GLUCOSE 106* 198*  BUN 14 15  CREATININE 0.92 0.86  CALCIUM 9.0 9.4   GFR: Estimated Creatinine Clearance: 108.6 mL/min (by C-G formula based on SCr of 0.86 mg/dL). Liver Function Tests: Recent Labs  Lab 11/11/24 0540  AST 20  ALT 15  ALKPHOS 98  BILITOT 0.3  PROT 7.2  ALBUMIN 4.1   No results for input(s): LIPASE, AMYLASE in the last 168 hours. No results for input(s): AMMONIA in the last 168 hours. Coagulation Profile: No results for input(s): INR, PROTIME in the last 168 hours. Cardiac Enzymes: No results for input(s): CKTOTAL, CKMB, CKMBINDEX, TROPONINI in the last 168 hours. BNP (last 3 results) Recent Labs    09/13/24 0324 10/23/24 1206 11/11/24 0540  PROBNP 69.3 69.0 <50.0   HbA1C: No results for input(s): HGBA1C in the last 72 hours. CBG: No results for input(s): GLUCAP in the last 168 hours. Lipid Profile: No results for input(s): CHOL, HDL, LDLCALC, TRIG, CHOLHDL, LDLDIRECT in the last 72 hours. Thyroid Function Tests: No results for input(s): TSH, T4TOTAL, FREET4, T3FREE, THYROIDAB in the last 72 hours. Anemia Panel: No results for input(s): VITAMINB12, FOLATE, FERRITIN, TIBC, IRON, RETICCTPCT in the last 72 hours. Sepsis Labs: No results for input(s): PROCALCITON, LATICACIDVEN in the last 168 hours.  Recent Results (from the past 240 hours)  Resp panel by RT-PCR (RSV, Flu A&B, Covid) Anterior Nasal Swab     Status: None   Collection  Time: 11/11/24  5:40 AM   Specimen: Anterior Nasal Swab  Result Value Ref Range Status   SARS Coronavirus 2 by RT PCR NEGATIVE NEGATIVE Final    Comment: (NOTE) SARS-CoV-2 target nucleic acids are NOT DETECTED.  The SARS-CoV-2 RNA is generally detectable in upper respiratory specimens during the acute phase of infection. The lowest concentration of SARS-CoV-2 viral copies this assay can detect is 138 copies/mL. A negative result does not preclude SARS-Cov-2 infection and should not be used as the sole basis for treatment or other patient management decisions. A negative result may occur with  improper specimen collection/handling, submission of specimen other than nasopharyngeal swab, presence of viral mutation(s) within the areas targeted by this assay, and inadequate number of viral copies(<138 copies/mL). A negative result must be combined with  clinical observations, patient history, and epidemiological information. The expected result is Negative.  Fact Sheet for Patients:  bloggercourse.com  Fact Sheet for Healthcare Providers:  seriousbroker.it  This test is no t yet approved or cleared by the [place]  FDA and  has been authorized for detection and/or diagnosis of SARS-CoV-2 by FDA under an Emergency Use Authorization (EUA). This EUA will remain  in effect (meaning this test can be used) for the duration of the COVID-19 declaration under Section 564(b)(1) of the Act, 21 U.S.C.section 360bbb-3(b)(1), unless the authorization is terminated  or revoked sooner.       Influenza A by PCR NEGATIVE NEGATIVE Final   Influenza B by PCR NEGATIVE NEGATIVE Final    Comment: (NOTE) The Xpert Xpress SARS-CoV-2/FLU/RSV plus assay is intended as an aid in the diagnosis of influenza from Nasopharyngeal swab specimens and should not be used as a sole basis for treatment. Nasal washings and aspirates are unacceptable for Xpert Xpress  SARS-CoV-2/FLU/RSV testing.  Fact Sheet for Patients: bloggercourse.com  Fact Sheet for Healthcare Providers: seriousbroker.it  This test is not yet approved or cleared by the [place]  FDA and has been authorized for detection and/or diagnosis of SARS-CoV-2 by FDA under an Emergency Use Authorization (EUA). This EUA will remain in effect (meaning this test can be used) for the duration of the COVID-19 declaration under Section 564(b)(1) of the Act, 21 U.S.C. section 360bbb-3(b)(1), unless the authorization is terminated or revoked.     Resp Syncytial Virus by PCR NEGATIVE NEGATIVE Final    Comment: (NOTE) Fact Sheet for Patients: bloggercourse.com  Fact Sheet for Healthcare Providers: seriousbroker.it  This test is not yet approved or cleared by the [place]  FDA and has been authorized for detection and/or diagnosis of SARS-CoV-2 by FDA under an Emergency Use Authorization (EUA). This EUA will remain in effect (meaning this test can be used) for the duration of the COVID-19 declaration under Section 564(b)(1) of the Act, 21 U.S.C. section 360bbb-3(b)(1), unless the authorization is terminated or revoked.  Performed at [facility], [address] [place], [place] [address]   Blood culture (routine x 2)     Status: None (Preliminary result)   Collection Time: 11/11/24  6:40 AM   Specimen: BLOOD RIGHT HAND  Result Value Ref Range Status   Specimen Description   Final    BLOOD RIGHT HAND BOTTLES DRAWN AEROBIC AND ANAEROBIC   Special Requests   Final    Blood Culture results may not be optimal due to an inadequate volume of blood received in culture bottles   Culture   Final    NO GROWTH < 24 HOURS Performed at [facility], [address] [place], [place] [address]    Report Status PENDING  Incomplete  Blood culture (routine x 2)     Status: None (Preliminary  result)   Collection Time: 11/11/24  7:50 AM   Specimen: BLOOD  Result Value Ref Range Status   Specimen Description BLOOD BLOOD RIGHT ARM  Final   Special Requests   Final    BOTTLES DRAWN AEROBIC AND ANAEROBIC Blood Culture adequate volume   Culture   Final    NO GROWTH < 24 HOURS Performed at [facility], [address] [place], [place] [address]    Report Status PENDING  Incomplete         Radiology Studies: CT Angio Chest PE W and/or Wo Contrast Result Date: 11/11/2024 EXAM: CTA of the Chest with contrast for PE 11/11/2024 08:58:28  AM TECHNIQUE: CTA of the chest was performed after the administration of intravenous contrast. Multiplanar reformatted images are provided for review. MIP images are provided for review. Automated exposure control, iterative reconstruction, and/or weight based adjustment of the mA/kV was utilized to reduce the radiation dose to as low as reasonably achievable. COMPARISON: 09/13/2024 CLINICAL HISTORY: Pulmonary embolism (PE) suspected, high prob. FINDINGS: PULMONARY ARTERIES: No pulmonary embolism. Main pulmonary artery is normal in caliber. MEDIASTINUM: The heart and pericardium demonstrate no acute abnormality. There is no acute abnormality of the thoracic aorta. Minimal atherosclerosis in the LAD. LYMPH NODES: No mediastinal, hilar or axillary lymphadenopathy. LUNGS AND PLEURA: Similar scarring in the left upper lobe and lingula. Multifocal scarring also present in the right middle and both lower lobes. Mild bronchial wall thickening with areas of mucous plugging in both lower lobes. Similar peribronchial ground glass opacities dependently in the right upper lobe. Unchanged 4 mm nodule dependently in the superior segment of the right lower lobe (axial 59). 5 mm anterior right upper lobe nodule (axial 34) is also unchanged. 4 mm nodule associated with the horizontal fissure likely an intrafissural lymph node, also unchanged. Mild centrilobular emphysema. No  pleural effusion or pneumothorax. UPPER ABDOMEN: Limited images of the upper abdomen are unremarkable. SOFT TISSUES AND BONES: Multilevel thoracic osteophytosis. No acute soft tissue abnormality. IMPRESSION: 1. No pulmonary embolism. 2. Mild diffuse bronchial wall thickening. Mucous plugging present in the right lower lobe. This may reflect changes of acute or chronic bronchitis or asthma. Aspiration also remains in the differential. No superimposed lobar pneumonia. 3. Mild centrilobular emphysema. A few scattered solid nodules are present, measuring up to 5 mm. Similar peribronchial ground-glass opacities in the dependent right upper lobe (axial 48- 59). Repeat chest CT in 12 months recommended to document resolution. Electronically signed by: Rogelia Myers MD MD 11/11/2024 09:35 AM EST RP Workstation: [id number]   DG Chest Portable 1 View Result Date: 11/11/2024 EXAM: 1 VIEW(S) XRAY OF THE CHEST 11/11/2024 05:35:00 AM COMPARISON: 10/26/2024 CLINICAL HISTORY: eval for sob FINDINGS: LUNGS AND PLEURA: Unchanged Band like opacity in left mid lung compatible with atelectasis versus fluid along the oblique fissure. Peribronchovascular opacities in bilateral lung bases. are stable No pleural effusion. No pneumothorax. HEART AND MEDIASTINUM: Mild cardiomegaly. BONES AND SOFT TISSUES: No acute osseous abnormality. IMPRESSION: 1. Peribronchovascular opacities in the bilateral lung bases. 2. Unchanged band-like opacity in the left mid lung compatible with atelectasis versus fluid along the oblique fissure. 3. Mild cardiomegaly. Electronically signed by: Waddell Calk MD MD 11/11/2024 06:11 AM EST RP Workstation: [id number]    Scheduled Meds:  budesonide  (PULMICORT ) nebulizer solution  0.5 mg Nebulization BID   enoxaparin  (LOVENOX ) injection  60 mg Subcutaneous Q24H   furosemide   40 mg Oral Daily   guaiFENesin   1,200 mg Oral BID   ipratropium-albuterol   3 mL Nebulization Q6H   methylPREDNISolone  (SOLU-MEDROL )  injection  40 mg Intravenous Q12H   pantoprazole   40 mg Oral Daily   pregabalin   100 mg Oral BID   Continuous Infusions:  cefTRIAXone  (ROCEPHIN )  IV 2 g (11/12/24 0804)   doxycycline  (VIBRAMYCIN ) IV 100 mg (11/12/24 0953)     LOS: 0 days    Time spent: 55 minutes    Fusaye Wachtel D Maree, DO [facility]  If 7PM-7AM, please contact night-coverage [email or web address] 11/12/2024, 12:21 PM   "

## 2024-11-12 NOTE — Progress Notes (Unsigned)
" °  Cardiology Office Note:  .   Date:  11/12/2024  ID:  [name], DOB 05/30/1961, MRN [id number] PCP: Renato Dorothey HERO, NP  [facility] Providers Cardiologist:  None { Click to update primary MD,subspecialty MD or APP then REFRESH:1}    History of Present Illness: Miguel Marsh is a 64 y.o. male with history of hypertension, COPD, metastatic non-small cell lung cancer, and arthritis, who has been referred for evaluation of chest pain.  ROS: See HPI  Studies Reviewed: .        *** Risk Assessment/Calculations:   {Does this patient have ATRIAL FIBRILLATION?:[phone]}         Physical Exam:   VS:  There were no vitals taken for this visit.   Wt Readings from Last 3 Encounters:  11/12/24 270 lb 4.5 oz (122.6 kg)  10/27/24 267 lb 13.7 oz (121.5 kg)  09/25/24 271 lb 2.7 oz (123 kg)    General:  NAD. Neck: No JVD or HJR. Lungs: Clear to auscultation bilaterally without wheezes or crackles. Heart: Regular rate and rhythm without murmurs, rubs, or gallops. Abdomen: Soft, nontender, nondistended. Extremities: No lower extremity edema.  ASSESSMENT AND PLAN: .    ***    {Are you ordering a CV Procedure (e.g. stress test, cath, DCCV, TEE, etc)?   Press F2        :[id number]}  Dispo: ***  Signed, Lonni Hanson, MD  "

## 2024-11-12 NOTE — Plan of Care (Signed)
  Problem: Education: Goal: Knowledge of General Education information will improve Description: Including pain rating scale, medication(s)/side effects and non-pharmacologic comfort measures Outcome: Progressing   Problem: Clinical Measurements: Goal: Ability to maintain clinical measurements within normal limits will improve Outcome: Progressing Goal: Will remain free from infection Outcome: Progressing Goal: Diagnostic test results will improve Outcome: Progressing Goal: Respiratory complications will improve Outcome: Progressing Goal: Cardiovascular complication will be avoided Outcome: Progressing   Problem: Pain Managment: Goal: General experience of comfort will improve and/or be controlled Outcome: Progressing   Problem: Safety: Goal: Ability to remain free from injury will improve Outcome: Progressing

## 2024-11-13 DIAGNOSIS — J441 Chronic obstructive pulmonary disease with (acute) exacerbation: Secondary | ICD-10-CM | POA: Diagnosis not present

## 2024-11-13 LAB — CBC
HCT: 34.1 % — ABNORMAL LOW (ref 39.0–52.0)
Hemoglobin: 10.6 g/dL — ABNORMAL LOW (ref 13.0–17.0)
MCH: 29.3 pg (ref 26.0–34.0)
MCHC: 31.1 g/dL (ref 30.0–36.0)
MCV: 94.2 fL (ref 80.0–100.0)
Platelets: 147 K/uL — ABNORMAL LOW (ref 150–400)
RBC: 3.62 MIL/uL — ABNORMAL LOW (ref 4.22–5.81)
RDW: 12.1 % (ref 11.5–15.5)
WBC: 12.5 K/uL — ABNORMAL HIGH (ref 4.0–10.5)
nRBC: 0 % (ref 0.0–0.2)

## 2024-11-13 LAB — BASIC METABOLIC PANEL WITH GFR
Anion gap: 4 — ABNORMAL LOW (ref 5–15)
BUN: 17 mg/dL (ref 8–23)
CO2: 39 mmol/L — ABNORMAL HIGH (ref 22–32)
Calcium: 9.4 mg/dL (ref 8.9–10.3)
Chloride: 98 mmol/L (ref 98–111)
Creatinine, Ser: 0.77 mg/dL (ref 0.61–1.24)
GFR, Estimated: >60 mL/min
Glucose, Bld: 192 mg/dL — ABNORMAL HIGH (ref 70–99)
Potassium: 4.3 mmol/L (ref 3.5–5.1)
Sodium: 141 mmol/L (ref 135–145)

## 2024-11-13 LAB — MAGNESIUM: Magnesium: 2 mg/dL (ref 1.7–2.4)

## 2024-11-13 NOTE — Progress Notes (Signed)
" °   11/13/24 2208  BiPAP/CPAP/SIPAP  BiPAP/CPAP/SIPAP Pt Type Adult  BiPAP/CPAP/SIPAP DREAMSTATIOND  Mask Type Full face mask  Mask Size Large  Respiratory Rate 18 breaths/min  Flow Rate 4 lpm  Patient Home Machine No  Patient Home Mask No  Patient Home Tubing No  Auto Titrate Yes  Minimum cmH2O 18 cmH2O  Maximum cmH2O 4 cmH2O  Device Plugged into RED Power Outlet Yes  BiPAP/CPAP /SiPAP Vitals  Pulse Rate 73  SpO2 97 %  Bilateral Breath Sounds Expiratory wheezes;Diminished  MEWS Score/Color  MEWS Score 0  MEWS Score Color Green    "

## 2024-11-13 NOTE — Plan of Care (Signed)

## 2024-11-13 NOTE — Progress Notes (Signed)
 " PROGRESS NOTE    [name]  FMW:[id number] DOB: 1961-08-20 DOA: 11/11/2024 PCP: Renato Dorothey HERO, NP   Brief Narrative:    Miguel Marsh is a 64 y.o. male with medical history significant of COPD, chronic hypoxia 4 L nasal cannula, cocaine use, COPD, diastolic CHF with EF 50%, HTN comes to the hospital with complaints of shortness of breath.  He was admitted for acute on chronic hypoxemic respiratory failure secondary to acute COPD exacerbation and has been started on IV steroids and breathing treatments with slow improvement noted.  Assessment & Plan:   Principal Problem:   COPD exacerbation (HCC) Active Problems:   Chronic respiratory failure with hypoxia and hypercapnia (HCC)   Essential hypertension  Assessment and Plan:   Acute on chronic hypoxic respiratory failure Acute COPD exacerbation - At home uses 4 L nasal cannula and was noted to be in 60% O2 saturation.  With abnormal breath sounds he is being admitted to the hospital.  CTA chest is negative for PE but does show bilateral mucous plugging.  For now we will start him on Solu-Medrol , scheduled as needed bronchodilators, Mucinex , empiric antibiotics.  Supportive care.  Incentive spirometer and flutter valve. -Flu, COVID/RSV are negative -Continue twice daily dosing of solumedrol through today   Congestive heart failure with preserved EF, 50% - No obvious signs of gross volume overload but will continue his home diuretics once his preadmission med rec is confirmed.  Intermittently may require IV but will follow his clinical course.  His proBNP has been normal but he is also obese.   Essential hypertension IV as needed, Lasix   Morbid obesity, class III -BMI 43.62    DVT prophylaxis:Lovenox  Code Status: Full Family Communication: None at bedside Disposition Plan:  Status is: Inpatient Remains inpatient appropriate because: Need for IV medications.    Consultants:  None  Procedures:   None  Antimicrobials:  Anti-infectives (From admission, onward)    Start     Dose/Rate Route Frequency Ordered Stop   11/12/24 2200  doxycycline  (VIBRA -TABS) tablet 100 mg        100 mg Oral Every 12 hours 11/12/24 1238     11/12/24 0800  cefTRIAXone  (ROCEPHIN ) 2 g in sodium chloride  0.9 % 100 mL IVPB        2 g 200 mL/hr over 30 Minutes Intravenous Every 24 hours 11/11/24 1105 11/16/24 0759   11/11/24 2200  doxycycline  (VIBRAMYCIN ) 100 mg in sodium chloride  0.9 % 250 mL IVPB  Status:  Discontinued        100 mg 125 mL/hr over 120 Minutes Intravenous Every 12 hours 11/11/24 1105 11/12/24 1238   11/11/24 0630  cefTRIAXone  (ROCEPHIN ) 2 g in sodium chloride  0.9 % 100 mL IVPB        2 g 200 mL/hr over 30 Minutes Intravenous  Once 11/11/24 0622 11/11/24 0709   11/11/24 0630  doxycycline  (VIBRAMYCIN ) 100 mg in sodium chloride  0.9 % 250 mL IVPB        100 mg 125 mL/hr over 120 Minutes Intravenous  Once 11/11/24 0622 11/11/24 0913       Subjective: Patient seen and evaluated today with no new acute complaints or concerns. No acute concerns or events noted overnight.  He states he continues to have a cough with difficulty getting phlegm up.  Additionally, he continues to remain fairly short of breath especially with ambulation and is not at baseline.  Objective: Vitals:   11/12/24 2225 11/13/24 9391 11/13/24 9193 11/13/24 0809  BP:  (!) 141/78    Pulse: 78 62    Resp: 18 20    Temp:  98.2 F (36.8 C)    TempSrc:  Oral    SpO2: 95% 98% 98% 100%  Weight:  122.1 kg    Height:        Intake/Output Summary (Last 24 hours) at 11/13/2024 1025 Last data filed at 11/12/2024 2100 Gross per 24 hour  Intake 120 ml  Output --  Net 120 ml   Filed Weights   11/11/24 1325 11/12/24 0438 11/13/24 9391  Weight: 123.1 kg 122.6 kg 122.1 kg    Examination:  General exam: Appears calm and comfortable, morbidly obese Respiratory system: Mild wheezing bilaterally, respiratory effort normal.   Currently on 4 L nasal cannula Cardiovascular system: S1 & S2 heard, RRR.  Gastrointestinal system: Abdomen is soft Central nervous system: Alert and awake Extremities: No edema Skin: No significant lesions noted Psychiatry: Flat affect.    Data Reviewed: I have personally reviewed following labs and imaging studies  CBC: Recent Labs  Lab 11/11/24 0540 11/12/24 0453 11/13/24 0509  WBC 8.1 12.7* 12.5*  NEUTROABS 4.7  --   --   HGB 12.4* 10.9* 10.6*  HCT 39.9 35.4* 34.1*  MCV 94.3 93.9 94.2  PLT 165 144* 147*   Basic Metabolic Panel: Recent Labs  Lab 11/11/24 0540 11/12/24 0453 11/13/24 0509  NA 143 141 141  K 3.7 4.3 4.3  CL 96* 97* 98  CO2 >45* 37* 39*  GLUCOSE 106* 198* 192*  BUN 14 15 17   CREATININE 0.92 0.86 0.77  CALCIUM 9.0 9.4 9.4  MG  --   --  2.0   GFR: Estimated Creatinine Clearance: 116.4 mL/min (by C-G formula based on SCr of 0.77 mg/dL). Liver Function Tests: Recent Labs  Lab 11/11/24 0540  AST 20  ALT 15  ALKPHOS 98  BILITOT 0.3  PROT 7.2  ALBUMIN 4.1   No results for input(s): LIPASE, AMYLASE in the last 168 hours. No results for input(s): AMMONIA in the last 168 hours. Coagulation Profile: No results for input(s): INR, PROTIME in the last 168 hours. Cardiac Enzymes: No results for input(s): CKTOTAL, CKMB, CKMBINDEX, TROPONINI in the last 168 hours. BNP (last 3 results) Recent Labs    09/13/24 0324 10/23/24 1206 11/11/24 0540  PROBNP 69.3 69.0 <50.0   HbA1C: No results for input(s): HGBA1C in the last 72 hours. CBG: No results for input(s): GLUCAP in the last 168 hours. Lipid Profile: No results for input(s): CHOL, HDL, LDLCALC, TRIG, CHOLHDL, LDLDIRECT in the last 72 hours. Thyroid Function Tests: No results for input(s): TSH, T4TOTAL, FREET4, T3FREE, THYROIDAB in the last 72 hours. Anemia Panel: No results for input(s): VITAMINB12, FOLATE, FERRITIN, TIBC, IRON,  RETICCTPCT in the last 72 hours. Sepsis Labs: No results for input(s): PROCALCITON, LATICACIDVEN in the last 168 hours.  Recent Results (from the past 240 hours)  Resp panel by RT-PCR (RSV, Flu A&B, Covid) Anterior Nasal Swab     Status: None   Collection Time: 11/11/24  5:40 AM   Specimen: Anterior Nasal Swab  Result Value Ref Range Status   SARS Coronavirus 2 by RT PCR NEGATIVE NEGATIVE Final    Comment: (NOTE) SARS-CoV-2 target nucleic acids are NOT DETECTED.  The SARS-CoV-2 RNA is generally detectable in upper respiratory specimens during the acute phase of infection. The lowest concentration of SARS-CoV-2 viral copies this assay can detect is 138 copies/mL. A negative result does not preclude SARS-Cov-2 infection and  should not be used as the sole basis for treatment or other patient management decisions. A negative result may occur with  improper specimen collection/handling, submission of specimen other than nasopharyngeal swab, presence of viral mutation(s) within the areas targeted by this assay, and inadequate number of viral copies(<138 copies/mL). A negative result must be combined with clinical observations, patient history, and epidemiological information. The expected result is Negative.  Fact Sheet for Patients:  bloggercourse.com  Fact Sheet for Healthcare Providers:  seriousbroker.it  This test is no t yet approved or cleared by the [place]  FDA and  has been authorized for detection and/or diagnosis of SARS-CoV-2 by FDA under an Emergency Use Authorization (EUA). This EUA will remain  in effect (meaning this test can be used) for the duration of the COVID-19 declaration under Section 564(b)(1) of the Act, 21 U.S.C.section 360bbb-3(b)(1), unless the authorization is terminated  or revoked sooner.       Influenza A by PCR NEGATIVE NEGATIVE Final   Influenza B by PCR NEGATIVE NEGATIVE Final     Comment: (NOTE) The Xpert Xpress SARS-CoV-2/FLU/RSV plus assay is intended as an aid in the diagnosis of influenza from Nasopharyngeal swab specimens and should not be used as a sole basis for treatment. Nasal washings and aspirates are unacceptable for Xpert Xpress SARS-CoV-2/FLU/RSV testing.  Fact Sheet for Patients: bloggercourse.com  Fact Sheet for Healthcare Providers: seriousbroker.it  This test is not yet approved or cleared by the [place]  FDA and has been authorized for detection and/or diagnosis of SARS-CoV-2 by FDA under an Emergency Use Authorization (EUA). This EUA will remain in effect (meaning this test can be used) for the duration of the COVID-19 declaration under Section 564(b)(1) of the Act, 21 U.S.C. section 360bbb-3(b)(1), unless the authorization is terminated or revoked.     Resp Syncytial Virus by PCR NEGATIVE NEGATIVE Final    Comment: (NOTE) Fact Sheet for Patients: bloggercourse.com  Fact Sheet for Healthcare Providers: seriousbroker.it  This test is not yet approved or cleared by the [place]  FDA and has been authorized for detection and/or diagnosis of SARS-CoV-2 by FDA under an Emergency Use Authorization (EUA). This EUA will remain in effect (meaning this test can be used) for the duration of the COVID-19 declaration under Section 564(b)(1) of the Act, 21 U.S.C. section 360bbb-3(b)(1), unless the authorization is terminated or revoked.  Performed at [facility], [address], [place], [place] [address]   Blood culture (routine x 2)     Status: None (Preliminary result)   Collection Time: 11/11/24  6:40 AM   Specimen: BLOOD RIGHT HAND  Result Value Ref Range Status   Specimen Description   Final    BLOOD RIGHT HAND BOTTLES DRAWN AEROBIC AND ANAEROBIC   Special Requests   Final    Blood Culture results may not be optimal due to an  inadequate volume of blood received in culture bottles   Culture   Final    NO GROWTH 2 DAYS Performed at [facility], [address] [place], [place] [address]    Report Status PENDING  Incomplete  Blood culture (routine x 2)     Status: None (Preliminary result)   Collection Time: 11/11/24  7:50 AM   Specimen: BLOOD  Result Value Ref Range Status   Specimen Description BLOOD BLOOD RIGHT ARM  Final   Special Requests   Final    BOTTLES DRAWN AEROBIC AND ANAEROBIC Blood Culture adequate volume   Culture   Final  NO GROWTH 2 DAYS Performed at [facility], [address], [place], [place] [address]    Report Status PENDING  Incomplete         Radiology Studies: No results found.   Scheduled Meds:  budesonide  (PULMICORT ) nebulizer solution  0.5 mg Nebulization BID   doxycycline   100 mg Oral Q12H   enoxaparin  (LOVENOX ) injection  60 mg Subcutaneous Q24H   furosemide   40 mg Oral Daily   guaiFENesin   1,200 mg Oral BID   ipratropium-albuterol   3 mL Nebulization Q6H   methylPREDNISolone  (SOLU-MEDROL ) injection  40 mg Intravenous Q12H   pantoprazole   40 mg Oral Daily   pregabalin   100 mg Oral BID   Continuous Infusions:  cefTRIAXone  (ROCEPHIN )  IV 2 g (11/13/24 0835)     LOS: 1 day    Time spent: 55 minutes    Liron Eissler D Maree, DO [facility]  If 7PM-7AM, please contact night-coverage [email or web address] 11/13/2024, 10:25 AM   "

## 2024-11-14 DIAGNOSIS — J441 Chronic obstructive pulmonary disease with (acute) exacerbation: Secondary | ICD-10-CM | POA: Diagnosis not present

## 2024-11-14 MED ORDER — HYDROCODONE-ACETAMINOPHEN 7.5-325 MG PO TABS: 1.0000 | ORAL_TABLET | ORAL | 0 refills | Status: AC | PRN

## 2024-11-14 MED ORDER — CLONAZEPAM 1 MG PO TABS: 1.0000 mg | ORAL_TABLET | Freq: Two times a day (BID) | ORAL | 0 refills | Status: AC | PRN

## 2024-11-14 MED ORDER — CEFDINIR 300 MG PO CAPS: 300.0000 mg | ORAL_CAPSULE | Freq: Two times a day (BID) | ORAL | 0 refills | Status: AC

## 2024-11-14 MED ORDER — PREDNISONE 10 MG PO TABS: 40.0000 mg | ORAL_TABLET | Freq: Every day | ORAL | 0 refills | Status: AC

## 2024-11-14 NOTE — Care Management Important Message (Signed)
 Important Message  Patient Details  Name: Miguel Marsh MRN: [id number] Date of Birth: February 04, 1961   Important Message Given:  Yes - Medicare IM     Vernella Niznik L Hildred Mollica 11/14/2024, 10:06 AM

## 2024-11-14 NOTE — TOC Transition Note (Signed)
 Transition of Care [facility]) - Discharge Note   Patient Details  Name: Miguel Marsh MRN: [id number] Date of Birth: [birth year]  Transition of Care [facility]) CM/SW Contact:  Hoy DELENA Bigness, LCSW Phone Number: 11/14/2024, 10:02 AM   Clinical Narrative:    Pt to return to [facility]. FL2 and DC summary have been faxed and emailed to [facility] for review. CSW spoke with Randie and confirmed DC plans. [facility] to provide transportation for pt.      Barriers to Discharge: Barriers Resolved   Patient Goals and CMS Choice Patient states their goals for this hospitalization and ongoing recovery are:: To return to group home CMS Medicare.gov Compare Post Acute Care list provided to:: Patient        Discharge Placement                Patient to be transferred to facility by: Facility van Name of family member notified: Patient, and group home staff Patient and family notified of of transfer: 11/14/24  Discharge Plan and Services Additional resources added to the After Visit Summary for   In-house Referral: Clinical Social Work Discharge Planning Services: NA Post Acute Care Choice: NA          DME Arranged: N/A DME Agency: NA                  Social Drivers of Health (SDOH) Interventions SDOH Screenings   Food Insecurity: No Food Insecurity (11/11/2024)  Housing: Low Risk (11/11/2024)  Transportation Needs: No Transportation Needs (11/11/2024)  Utilities: Not At Risk (11/11/2024)  Social Connections: Moderately Integrated (10/24/2024)  Tobacco Use: High Risk (11/11/2024)     Readmission Risk Interventions    10/24/2024    2:07 PM 07/12/2024    4:22 PM 05/20/2024    9:57 AM  Readmission Risk Prevention Plan  Transportation Screening Complete Complete Complete  Medication Review Oceanographer) Complete Complete Complete  PCP or Specialist appointment within 3-5 days of discharge Not Complete Complete   HRI or Home Care Consult Complete Complete Complete  SW  Recovery Care/Counseling Consult  Complete Complete  Palliative Care Screening Not Applicable Not Applicable Not Applicable  Skilled Nursing Facility Not Applicable Not Applicable Not Applicable

## 2024-11-14 NOTE — Discharge Summary (Signed)
 Physician Discharge Summary  CHAZE HRUSKA FMW:[id number] DOB: 10-23-1961 DOA: 11/11/2024  PCP: Renato Dorothey HERO, NP  Admit date: 11/11/2024  Discharge date: 11/14/2024  Admitted From:Group Home  Disposition:  Group Home  Recommendations for Outpatient Follow-up:  Follow up with PCP in 1-2 weeks Remain on prednisone  as prescribed for 5 days Continue breathing treatments as needed for shortness of breath or wheezing Continue Omnicef  as prescribed for 3 more days and continue azithromycin  Continue other medications as noted below  Home Health: None  Equipment/Devices: Chronic 4 L nasal cannula  Discharge Condition:Stable  CODE STATUS: Full  Diet recommendation: Heart Healthy  Brief/Interim Summary: OSEPH Marsh is a 64 y.o. male with medical history significant of COPD, chronic hypoxia 4 L nasal cannula, cocaine use, COPD, diastolic CHF with EF 50%, HTN comes to the hospital with complaints of shortness of breath.  He was admitted for acute on chronic hypoxemic respiratory failure secondary to acute COPD exacerbation and had been started on IV steroids and breathing treatments with slow improvement noted.  He continues to complain of some shortness of breath with ambulation, but this has been a persistent issue for him in the past.  Overall he appears to have achieved maximal medical benefit during this hospitalization and can transition to oral prednisone  for few more days and continue antibiotics as prescribed as well as home breathing treatments.  No other significant complaints or concerns noted throughout the course of this admission.  Discharge Diagnoses:  Principal Problem:   COPD exacerbation (HCC) Active Problems:   Chronic respiratory failure with hypoxia and hypercapnia (HCC)   Essential hypertension  Principal discharge diagnosis: Acute on chronic hypoxemic respiratory failure secondary to acute COPD exacerbation.  Discharge Instructions  Discharge  Instructions     Increase activity slowly   Complete by: As directed       Allergies as of 11/14/2024       Reactions   Ibuprofen  Shortness Of Breath        Medication List     STOP taking these medications    hydrochlorothiazide  25 MG tablet Commonly known as: HYDRODIURIL        TAKE these medications    acetaminophen  325 MG tablet Commonly known as: TYLENOL  Take 2 tablets (650 mg total) by mouth every 6 (six) hours as needed for mild pain (pain score 1-3) or fever (or Fever >/= 101).   albuterol  (2.5 MG/3ML) 0.083% nebulizer solution Commonly known as: PROVENTIL  Inhale 3 mLs (2.5 mg total) into the lungs every 4 (four) hours as needed for wheezing or shortness of breath.   albuterol  108 (90 Base) MCG/ACT inhaler Commonly known as: VENTOLIN  HFA Inhale 2 puffs into the lungs every 4 (four) hours as needed for shortness of breath or wheezing (Cough).   azithromycin  250 MG tablet Commonly known as: ZITHROMAX  Take 250 mg by mouth daily.   benzonatate  100 MG capsule Commonly known as: TESSALON  Take 1 capsule (100 mg total) by mouth 3 (three) times daily as needed for cough.   Breztri  Aerosphere 160-9-4.8 MCG/ACT Aero inhaler Generic drug: budesonide -glycopyrrolate -formoterol  Inhale 2 puffs into the lungs 2 (two) times daily.   cefdinir  300 MG capsule Commonly known as: OMNICEF  Take 1 capsule (300 mg total) by mouth 2 (two) times daily for 3 days.   clonazePAM  1 MG tablet Commonly known as: KLONOPIN  Take 1 tablet (1 mg total) by mouth 2 (two) times daily as needed for anxiety.   furosemide  40 MG tablet Commonly known as: Lasix  Take  1 tablet (40 mg total) by mouth daily. What changed: Another medication with the same name was removed. Continue taking this medication, and follow the directions you see here.   guaiFENesin  600 MG 12 hr tablet Commonly known as: Mucinex  Take 1 tablet (600 mg total) by mouth 2 (two) times daily.   HYDROcodone -acetaminophen   7.5-325 MG tablet Commonly known as: NORCO Take 1 tablet by mouth every 4 (four) hours as needed for moderate pain (pain score 4-6).   omeprazole  40 MG capsule Commonly known as: PRILOSEC Take 1 capsule (40 mg total) by mouth in the morning and at bedtime.   OXYGEN  Inhale 4 L into the lungs continuous.   prednisoLONE acetate 1 % ophthalmic suspension Commonly known as: PRED FORTE SMARTSIG:In Eye(s)   predniSONE  10 MG tablet Commonly known as: DELTASONE  Take 4 tablets (40 mg total) by mouth daily for 5 days.   pregabalin  100 MG capsule Commonly known as: LYRICA  Take 100 mg by mouth 2 (two) times daily.   Vitamin D (Ergocalciferol) 1.25 MG (50000 UNIT) Caps capsule Commonly known as: DRISDOL Take 50,000 Units by mouth once a week.        Follow-up Information     Renato Dorothey HERO, NP. Schedule an appointment as soon as possible for a visit in 1 week(s).   Specialty: Internal Medicine Contact information: [address] [place] [address] [phone]                Allergies[1]  Consultations: None   Procedures/Studies: CT Angio Chest PE W and/or Wo Contrast Result Date: 11/11/2024 EXAM: CTA of the Chest with contrast for PE 11/11/2024 08:58:28 AM TECHNIQUE: CTA of the chest was performed after the administration of intravenous contrast. Multiplanar reformatted images are provided for review. MIP images are provided for review. Automated exposure control, iterative reconstruction, and/or weight based adjustment of the mA/kV was utilized to reduce the radiation dose to as low as reasonably achievable. COMPARISON: 09/13/2024 CLINICAL HISTORY: Pulmonary embolism (PE) suspected, high prob. FINDINGS: PULMONARY ARTERIES: No pulmonary embolism. Main pulmonary artery is normal in caliber. MEDIASTINUM: The heart and pericardium demonstrate no acute abnormality. There is no acute abnormality of the thoracic aorta. Minimal atherosclerosis in the LAD. LYMPH NODES: No  mediastinal, hilar or axillary lymphadenopathy. LUNGS AND PLEURA: Similar scarring in the left upper lobe and lingula. Multifocal scarring also present in the right middle and both lower lobes. Mild bronchial wall thickening with areas of mucous plugging in both lower lobes. Similar peribronchial ground glass opacities dependently in the right upper lobe. Unchanged 4 mm nodule dependently in the superior segment of the right lower lobe (axial 59). 5 mm anterior right upper lobe nodule (axial 34) is also unchanged. 4 mm nodule associated with the horizontal fissure likely an intrafissural lymph node, also unchanged. Mild centrilobular emphysema. No pleural effusion or pneumothorax. UPPER ABDOMEN: Limited images of the upper abdomen are unremarkable. SOFT TISSUES AND BONES: Multilevel thoracic osteophytosis. No acute soft tissue abnormality. IMPRESSION: 1. No pulmonary embolism. 2. Mild diffuse bronchial wall thickening. Mucous plugging present in the right lower lobe. This may reflect changes of acute or chronic bronchitis or asthma. Aspiration also remains in the differential. No superimposed lobar pneumonia. 3. Mild centrilobular emphysema. A few scattered solid nodules are present, measuring up to 5 mm. Similar peribronchial ground-glass opacities in the dependent right upper lobe (axial 48- 59). Repeat chest CT in 12 months recommended to document resolution. Electronically signed by: Rogelia Myers MD MD 11/11/2024  09:35 AM EST RP Workstation: [id number]   DG Chest Portable 1 View Result Date: 11/11/2024 EXAM: 1 VIEW(S) XRAY OF THE CHEST 11/11/2024 05:35:00 AM COMPARISON: 10/26/2024 CLINICAL HISTORY: eval for sob FINDINGS: LUNGS AND PLEURA: Unchanged Band like opacity in left mid lung compatible with atelectasis versus fluid along the oblique fissure. Peribronchovascular opacities in bilateral lung bases. are stable No pleural effusion. No pneumothorax. HEART AND MEDIASTINUM: Mild cardiomegaly. BONES AND SOFT  TISSUES: No acute osseous abnormality. IMPRESSION: 1. Peribronchovascular opacities in the bilateral lung bases. 2. Unchanged band-like opacity in the left mid lung compatible with atelectasis versus fluid along the oblique fissure. 3. Mild cardiomegaly. Electronically signed by: Waddell Calk MD MD 11/11/2024 06:11 AM EST RP Workstation: [id number]   DG Chest Port 1 View Result Date: 10/26/2024 CLINICAL DATA:  Dyspnea and respiratory abnormalities. EXAM: PORTABLE CHEST 1 VIEW COMPARISON:  10/23/2024 FINDINGS: Cardiomegaly is again seen. Improvement in vascular congestion. Improving retrocardiac opacity with residual bandlike opacities. Oval scarring in the periphery of the right lower lobe. No significant pleural effusion. No pneumothorax. IMPRESSION: 1. Improving vascular congestion. 2. Improving retrocardiac opacity with residual bandlike opacities, likely atelectasis. 3. Cardiomegaly. Electronically Signed   By: Andrea Gasman M.D.   On: 10/26/2024 10:54   ECHOCARDIOGRAM COMPLETE Result Date: 10/24/2024    ECHOCARDIOGRAM REPORT   Patient Name:   Miguel Marsh Date of Exam: 10/24/2024 Medical Rec #:  [id number]        Height:       66.0 in Accession #:    [id number]       Weight:       270.1 lb Date of Birth:  June 18, 1961        BSA:          2.272 m Patient Age:    63 years         BP:           112/61 mmHg Patient Gender: M                HR:           81 bpm. Exam Location:  [facility] Procedure: 2D Echo, Cardiac Doppler, Color Doppler and Strain Analysis (Both            Spectral and Color Flow Doppler were utilized during procedure). Indications:    Acute on chronic hypoxic respiratory failure (HCC)  History:        Patient has prior history of Echocardiogram examinations, most                 recent 03/06/2023. COPD; Risk Factors:Hypertension and Obesity. Hx                 of cocaine abuse and COVID-19.  Sonographer:    Aida Pizza RCS Referring Phys: [phone] TULLY FORBES CARWIN  Sonographer  Comments: Global longitudinal strain was attempted. IMPRESSIONS  1. Left ventricular ejection fraction, by estimation, is 55 to 60%. The left ventricle has normal function. Left ventricular endocardial border not optimally defined to evaluate regional wall motion. Left ventricular diastolic parameters are consistent with Grade I diastolic dysfunction (impaired relaxation). The average left ventricular global longitudinal strain is -9.6 %.  2. Right ventricular systolic function was not well visualized. The right ventricular size is normal. Tricuspid regurgitation signal is inadequate for assessing PA pressure.  3. The mitral valve is grossly normal. No evidence of mitral valve regurgitation. No evidence of mitral stenosis.  4. The aortic valve was not  well visualized. Aortic valve regurgitation is not visualized. No aortic stenosis is present.  5. The inferior vena cava is normal in size with greater than 50% respiratory variability, suggesting right atrial pressure of 3 mmHg. Comparison(s): No significant change from prior study. FINDINGS  Left Ventricle: Left ventricular ejection fraction, by estimation, is 55 to 60%. The left ventricle has normal function. Left ventricular endocardial border not optimally defined to evaluate regional wall motion. The average left ventricular global longitudinal strain is -9.6 %. Strain was performed and the global longitudinal strain is indeterminate. The left ventricular internal cavity size was normal in size. There is no left ventricular hypertrophy. Left ventricular diastolic parameters are consistent with Grade I diastolic dysfunction (impaired relaxation). Normal left ventricular filling pressure. Right Ventricle: The right ventricular size is normal. No increase in right ventricular wall thickness. Right ventricular systolic function was not well visualized. Tricuspid regurgitation signal is inadequate for assessing PA pressure. Left Atrium: Left atrial size was normal in  size. Right Atrium: Right atrial size was normal in size. Pericardium: There is no evidence of pericardial effusion. Mitral Valve: The mitral valve is grossly normal. No evidence of mitral valve regurgitation. No evidence of mitral valve stenosis. Tricuspid Valve: The tricuspid valve is not well visualized. Tricuspid valve regurgitation is not demonstrated. No evidence of tricuspid stenosis. Aortic Valve: The aortic valve was not well visualized. Aortic valve regurgitation is not visualized. No aortic stenosis is present. Pulmonic Valve: The pulmonic valve was not well visualized. Pulmonic valve regurgitation is not visualized. No evidence of pulmonic stenosis. Aorta: The aortic root is normal in size and structure. Venous: The inferior vena cava is normal in size with greater than 50% respiratory variability, suggesting right atrial pressure of 3 mmHg. IAS/Shunts: No atrial level shunt detected by color flow Doppler. Additional Comments: 3D was performed not requiring image post processing on an independent workstation and was indeterminate.  LEFT VENTRICLE PLAX 2D LVIDd:         4.60 cm      Diastology LVIDs:         3.20 cm      LV e' medial:    11.70 cm/s LV PW:         1.10 cm      LV E/e' medial:  9.1 LV IVS:        1.00 cm      LV e' lateral:   12.60 cm/s LVOT diam:     2.00 cm      LV E/e' lateral: 8.5 LV SV:         92 LV SV Index:   41           2D Longitudinal Strain LVOT Area:     3.14 cm     2D Strain GLS Avg:     -9.6 %  LV Volumes (MOD) LV vol d, MOD A2C: 107.0 ml LV vol d, MOD A4C: 92.6 ml LV vol s, MOD A2C: 44.7 ml LV vol s, MOD A4C: 36.3 ml LV SV MOD A2C:     62.3 ml LV SV MOD A4C:     92.6 ml LV SV MOD BP:      59.8 ml RIGHT VENTRICLE RV S prime:     18.50 cm/s TAPSE (M-mode): 2.9 cm LEFT ATRIUM             Index        RIGHT ATRIUM           Index  LA diam:        3.00 cm 1.32 cm/m   RA Area:     16.90 cm LA Vol (A2C):   58.6 ml 25.79 ml/m  RA Volume:   46.00 ml  20.25 ml/m LA Vol (A4C):    53.0 ml 23.33 ml/m LA Biplane Vol: 57.5 ml 25.31 ml/m  AORTIC VALVE LVOT Vmax:   165.00 cm/s LVOT Vmean:  101.000 cm/s LVOT VTI:    0.293 m  AORTA Ao Root diam: 3.40 cm MITRAL VALVE MV Area (PHT): 3.77 cm     SHUNTS MV Decel Time: 201 msec     Systemic VTI:  0.29 m MV E velocity: 107.00 cm/s  Systemic Diam: 2.00 cm MV A velocity: 112.00 cm/s MV E/A ratio:  0.96 Vishnu Priya Mallipeddi Electronically signed by Diannah Late Mallipeddi Signature Date/Time: 10/24/2024/2:49:50 PM    Final    DG Chest Portable 1 View Result Date: 10/23/2024 CLINICAL DATA:  Shortness of breath. EXAM: PORTABLE CHEST 1 VIEW COMPARISON:  [facility] 05/18/2024 FINDINGS: The cardio pericardial silhouette is enlarged. There is pulmonary vascular congestion without overt pulmonary edema. Streaky density in the parahilar left lung is stable with evidence of retrocardiac left base collapse/consolidation. Small left effusion not excluded. Telemetry leads overlie the chest. IMPRESSION: 1. Enlargement of the cardiopericardial silhouette with pulmonary vascular congestion. 2. Retrocardiac left base collapse/consolidation with possible small left effusion. Electronically Signed   By: Camellia Candle M.D.   On: 10/23/2024 12:39     Discharge Exam: Vitals:   11/14/24 0432 11/14/24 0841  BP: 132/74   Pulse: 77   Resp: 18   Temp: 97.9 F (36.6 C)   SpO2: 100% 98%   Vitals:   11/13/24 2208 11/14/24 0432 11/14/24 0435 11/14/24 0841  BP:  132/74    Pulse: 73 77    Resp:  18    Temp:  97.9 F (36.6 C)    TempSrc:  Oral    SpO2: 97% 100%  98%  Weight:   123.7 kg   Height:        General: Pt is alert, awake, not in acute distress, obese Cardiovascular: RRR, S1/S2 +, no rubs, no gallops Respiratory: CTA bilaterally, no wheezing, no rhonchi, 4 L nasal cannula Abdominal: Soft, NT, ND, bowel sounds + Extremities: no edema, no cyanosis    The results of significant diagnostics from this hospitalization (including imaging,  microbiology, ancillary and laboratory) are listed below for reference.     Microbiology: Recent Results (from the past 240 hours)  Resp panel by RT-PCR (RSV, Flu A&B, Covid) Anterior Nasal Swab     Status: None   Collection Time: 11/11/24  5:40 AM   Specimen: Anterior Nasal Swab  Result Value Ref Range Status   SARS Coronavirus 2 by RT PCR NEGATIVE NEGATIVE Final    Comment: (NOTE) SARS-CoV-2 target nucleic acids are NOT DETECTED.  The SARS-CoV-2 RNA is generally detectable in upper respiratory specimens during the acute phase of infection. The lowest concentration of SARS-CoV-2 viral copies this assay can detect is 138 copies/mL. A negative result does not preclude SARS-Cov-2 infection and should not be used as the sole basis for treatment or other patient management decisions. A negative result may occur with  improper specimen collection/handling, submission of specimen other than nasopharyngeal swab, presence of viral mutation(s) within the areas targeted by this assay, and inadequate number of viral copies(<138 copies/mL). A negative result must be combined with clinical observations, patient history, and epidemiological information. The expected  result is Negative.  Fact Sheet for Patients:  bloggercourse.com  Fact Sheet for Healthcare Providers:  seriousbroker.it  This test is no t yet approved or cleared by the [place]  FDA and  has been authorized for detection and/or diagnosis of SARS-CoV-2 by FDA under an Emergency Use Authorization (EUA). This EUA will remain  in effect (meaning this test can be used) for the duration of the COVID-19 declaration under Section 564(b)(1) of the Act, 21 U.S.C.section 360bbb-3(b)(1), unless the authorization is terminated  or revoked sooner.       Influenza A by PCR NEGATIVE NEGATIVE Final   Influenza B by PCR NEGATIVE NEGATIVE Final    Comment: (NOTE) The Xpert Xpress  SARS-CoV-2/FLU/RSV plus assay is intended as an aid in the diagnosis of influenza from Nasopharyngeal swab specimens and should not be used as a sole basis for treatment. Nasal washings and aspirates are unacceptable for Xpert Xpress SARS-CoV-2/FLU/RSV testing.  Fact Sheet for Patients: bloggercourse.com  Fact Sheet for Healthcare Providers: seriousbroker.it  This test is not yet approved or cleared by the [place]  FDA and has been authorized for detection and/or diagnosis of SARS-CoV-2 by FDA under an Emergency Use Authorization (EUA). This EUA will remain in effect (meaning this test can be used) for the duration of the COVID-19 declaration under Section 564(b)(1) of the Act, 21 U.S.C. section 360bbb-3(b)(1), unless the authorization is terminated or revoked.     Resp Syncytial Virus by PCR NEGATIVE NEGATIVE Final    Comment: (NOTE) Fact Sheet for Patients: bloggercourse.com  Fact Sheet for Healthcare Providers: seriousbroker.it  This test is not yet approved or cleared by the [place]  FDA and has been authorized for detection and/or diagnosis of SARS-CoV-2 by FDA under an Emergency Use Authorization (EUA). This EUA will remain in effect (meaning this test can be used) for the duration of the COVID-19 declaration under Section 564(b)(1) of the Act, 21 U.S.C. section 360bbb-3(b)(1), unless the authorization is terminated or revoked.  Performed at [facility], [address] [place], [place] [address]   Blood culture (routine x 2)     Status: None (Preliminary result)   Collection Time: 11/11/24  6:40 AM   Specimen: BLOOD RIGHT HAND  Result Value Ref Range Status   Specimen Description   Final    BLOOD RIGHT HAND BOTTLES DRAWN AEROBIC AND ANAEROBIC   Special Requests   Final    Blood Culture results may not be optimal due to an inadequate volume of blood received  in culture bottles   Culture   Final    NO GROWTH 3 DAYS Performed at [facility], [address] [place], [place] [address]    Report Status PENDING  Incomplete  Blood culture (routine x 2)     Status: None (Preliminary result)   Collection Time: 11/11/24  7:50 AM   Specimen: BLOOD  Result Value Ref Range Status   Specimen Description BLOOD BLOOD RIGHT ARM  Final   Special Requests   Final    BOTTLES DRAWN AEROBIC AND ANAEROBIC Blood Culture adequate volume   Culture   Final    NO GROWTH 3 DAYS Performed at [facility], [address], [place], [place] [address]    Report Status PENDING  Incomplete     Labs: BNP (last 3 results) Recent Labs    04/02/24 0847 05/18/24 0822 07/11/24 1806  BNP 11.0 24.0 10.0   Basic Metabolic Panel: Recent Labs  Lab 11/11/24 0540 11/12/24 0453 11/13/24 0509  NA 143  141 141  K 3.7 4.3 4.3  CL 96* 97* 98  CO2 >45* 37* 39*  GLUCOSE 106* 198* 192*  BUN 14 15 17   CREATININE 0.92 0.86 0.77  CALCIUM 9.0 9.4 9.4  MG  --   --  2.0   Liver Function Tests: Recent Labs  Lab 11/11/24 0540  AST 20  ALT 15  ALKPHOS 98  BILITOT 0.3  PROT 7.2  ALBUMIN 4.1   No results for input(s): LIPASE, AMYLASE in the last 168 hours. No results for input(s): AMMONIA in the last 168 hours. CBC: Recent Labs  Lab 11/11/24 0540 11/12/24 0453 11/13/24 0509  WBC 8.1 12.7* 12.5*  NEUTROABS 4.7  --   --   HGB 12.4* 10.9* 10.6*  HCT 39.9 35.4* 34.1*  MCV 94.3 93.9 94.2  PLT 165 144* 147*   Cardiac Enzymes: No results for input(s): CKTOTAL, CKMB, CKMBINDEX, TROPONINI in the last 168 hours. BNP: Invalid input(s): POCBNP CBG: No results for input(s): GLUCAP in the last 168 hours. D-Dimer No results for input(s): DDIMER in the last 72 hours. Hgb A1c No results for input(s): HGBA1C in the last 72 hours. Lipid Profile No results for input(s): CHOL, HDL, LDLCALC, TRIG, CHOLHDL, LDLDIRECT in the last 72  hours. Thyroid function studies No results for input(s): TSH, T4TOTAL, T3FREE, THYROIDAB in the last 72 hours.  Invalid input(s): FREET3 Anemia work up No results for input(s): VITAMINB12, FOLATE, FERRITIN, TIBC, IRON, RETICCTPCT in the last 72 hours. Urinalysis    Component Value Date/Time   COLORURINE YELLOW 08/03/2024 1038   APPEARANCEUR CLEAR 08/03/2024 1038   LABSPEC 1.018 08/03/2024 1038   PHURINE 6.0 08/03/2024 1038   GLUCOSEU NEGATIVE 08/03/2024 1038   HGBUR NEGATIVE 08/03/2024 1038   BILIRUBINUR NEGATIVE 08/03/2024 1038   KETONESUR NEGATIVE 08/03/2024 1038   PROTEINUR NEGATIVE 08/03/2024 1038   UROBILINOGEN 0.2 10/21/2013 0200   NITRITE NEGATIVE 08/03/2024 1038   LEUKOCYTESUR NEGATIVE 08/03/2024 1038   Sepsis Labs Recent Labs  Lab 11/11/24 0540 11/12/24 0453 11/13/24 0509  WBC 8.1 12.7* 12.5*   Microbiology Recent Results (from the past 240 hours)  Resp panel by RT-PCR (RSV, Flu A&B, Covid) Anterior Nasal Swab     Status: None   Collection Time: 11/11/24  5:40 AM   Specimen: Anterior Nasal Swab  Result Value Ref Range Status   SARS Coronavirus 2 by RT PCR NEGATIVE NEGATIVE Final    Comment: (NOTE) SARS-CoV-2 target nucleic acids are NOT DETECTED.  The SARS-CoV-2 RNA is generally detectable in upper respiratory specimens during the acute phase of infection. The lowest concentration of SARS-CoV-2 viral copies this assay can detect is 138 copies/mL. A negative result does not preclude SARS-Cov-2 infection and should not be used as the sole basis for treatment or other patient management decisions. A negative result may occur with  improper specimen collection/handling, submission of specimen other than nasopharyngeal swab, presence of viral mutation(s) within the areas targeted by this assay, and inadequate number of viral copies(<138 copies/mL). A negative result must be combined with clinical observations, patient history, and  epidemiological information. The expected result is Negative.  Fact Sheet for Patients:  bloggercourse.com  Fact Sheet for Healthcare Providers:  seriousbroker.it  This test is no t yet approved or cleared by the [place]  FDA and  has been authorized for detection and/or diagnosis of SARS-CoV-2 by FDA under an Emergency Use Authorization (EUA). This EUA will remain  in effect (meaning this test can be used) for the duration of the  COVID-19 declaration under Section 564(b)(1) of the Act, 21 U.S.C.section 360bbb-3(b)(1), unless the authorization is terminated  or revoked sooner.       Influenza A by PCR NEGATIVE NEGATIVE Final   Influenza B by PCR NEGATIVE NEGATIVE Final    Comment: (NOTE) The Xpert Xpress SARS-CoV-2/FLU/RSV plus assay is intended as an aid in the diagnosis of influenza from Nasopharyngeal swab specimens and should not be used as a sole basis for treatment. Nasal washings and aspirates are unacceptable for Xpert Xpress SARS-CoV-2/FLU/RSV testing.  Fact Sheet for Patients: bloggercourse.com  Fact Sheet for Healthcare Providers: seriousbroker.it  This test is not yet approved or cleared by the [place]  FDA and has been authorized for detection and/or diagnosis of SARS-CoV-2 by FDA under an Emergency Use Authorization (EUA). This EUA will remain in effect (meaning this test can be used) for the duration of the COVID-19 declaration under Section 564(b)(1) of the Act, 21 U.S.C. section 360bbb-3(b)(1), unless the authorization is terminated or revoked.     Resp Syncytial Virus by PCR NEGATIVE NEGATIVE Final    Comment: (NOTE) Fact Sheet for Patients: bloggercourse.com  Fact Sheet for Healthcare Providers: seriousbroker.it  This test is not yet approved or cleared by the [place]  FDA and has been  authorized for detection and/or diagnosis of SARS-CoV-2 by FDA under an Emergency Use Authorization (EUA). This EUA will remain in effect (meaning this test can be used) for the duration of the COVID-19 declaration under Section 564(b)(1) of the Act, 21 U.S.C. section 360bbb-3(b)(1), unless the authorization is terminated or revoked.  Performed at [facility], [address] [place], [place] [address]   Blood culture (routine x 2)     Status: None (Preliminary result)   Collection Time: 11/11/24  6:40 AM   Specimen: BLOOD RIGHT HAND  Result Value Ref Range Status   Specimen Description   Final    BLOOD RIGHT HAND BOTTLES DRAWN AEROBIC AND ANAEROBIC   Special Requests   Final    Blood Culture results may not be optimal due to an inadequate volume of blood received in culture bottles   Culture   Final    NO GROWTH 3 DAYS Performed at [facility], [address], [place], [place] [address]    Report Status PENDING  Incomplete  Blood culture (routine x 2)     Status: None (Preliminary result)   Collection Time: 11/11/24  7:50 AM   Specimen: BLOOD  Result Value Ref Range Status   Specimen Description BLOOD BLOOD RIGHT ARM  Final   Special Requests   Final    BOTTLES DRAWN AEROBIC AND ANAEROBIC Blood Culture adequate volume   Culture   Final    NO GROWTH 3 DAYS Performed at [facility], [address], [place], [place] [address]    Report Status PENDING  Incomplete     Time coordinating discharge: 35 minutes  SIGNED:   Adron JONETTA Fairly, DO [facility] 11/14/2024, 10:04 AM  If 7PM-7AM, please contact night-coverage [email or web address]     [1]  Allergies Allergen Reactions   Ibuprofen  Shortness Of Breath

## 2024-11-14 NOTE — [place] FL2 (Signed)
 " [place]  MEDICAID FL2 LEVEL OF CARE FORM     IDENTIFICATION  Patient Name: Miguel Marsh Birthdate: Sep 03, 1961 Sex: male Admission Date (Current Location): 11/11/2024  [place] and [place] Number:  [name] [id number] Facility and Address:  [facility],  [address], [place] [address]      Provider Number: [id number]  Attending Physician Name and Address:  Maree Adron BIRCH, DO  Relative Name and Phone Number:  Haneef, Hallquist, Emergency Contact  [phone]    Current Level of Care: Hospital Recommended Level of Care: [facility] Prior Approval Number:    Date Approved/Denied:   PASRR Number:    Discharge Plan: Other (Comment) [facility])    Current Diagnoses: Patient Active Problem List   Diagnosis Date Noted   Acute on chronic hypoxic respiratory failure (HCC) 10/23/2024   Anxiety 01/15/2024   Cocaine abuse (HCC) 09/13/2023   CAP (community acquired pneumonia) 06/24/2023   Lung nodule 06/24/2023   COVID-19 virus infection 06/24/2023   Elevated MCV 03/06/2023   Acute metabolic encephalopathy 02/01/2023   Leukocytosis 10/06/2020   Goals of care, counseling/discussion    Palliative care by specialist    Encounter for smoking cessation counseling    Hyperglycemia 04/12/2020   Chronic respiratory failure with hypoxia and hypercapnia (HCC) 01/22/2020   Acute on chronic respiratory failure with hypoxia and hypercapnia (HCC) 01/23/2018   Obesity, Class III, BMI 40-49.9 (morbid obesity) (HCC) 01/01/2018   Acute exacerbation of chronic obstructive pulmonary disease (COPD) (HCC) 01/01/2018   Essential hypertension 12/09/2015   Non-small cell cancer of right lung (in remission) 12/09/2015   GERD (gastroesophageal reflux disease) 12/09/2015   Tobacco use disorder 12/17/2014   COPD exacerbation (HCC) 12/17/2014   Obesity 12/17/2014   Dyspnea    Difficulty walking 04/02/2013   Hip weakness 04/02/2013    Orientation RESPIRATION BLADDER  Height & Weight     Self, Time, Situation, Place  O2 (4L) Continent Weight: 272 lb 11.3 oz (123.7 kg) Height:  5' 6 (167.6 cm)  BEHAVIORAL SYMPTOMS/MOOD NEUROLOGICAL BOWEL NUTRITION STATUS      Continent Diet (low sodium heart healthy)  AMBULATORY STATUS COMMUNICATION OF NEEDS Skin   Limited Assist Verbally Normal                       Personal Care Assistance Level of Assistance  Bathing, Feeding, Dressing Bathing Assistance: Limited assistance Feeding assistance: Independent Dressing Assistance: Limited assistance     Functional Limitations Info  Sight, Hearing, Speech Sight Info: Adequate Hearing Info: Adequate Speech Info: Adequate    SPECIAL CARE FACTORS FREQUENCY                       Contractures Contractures Info: Not present    Additional Factors Info  Code Status, Allergies Code Status Info: FULL Allergies Info: Ibupfrofen           Current Medications (11/14/2024):  This is the current hospital active medication list Current Facility-Administered Medications  Medication Dose Route Frequency Provider Last Rate Last Admin   acetaminophen  (TYLENOL ) tablet 650 mg  650 mg Oral Q6H PRN Amin, Ankit C, MD       Or   acetaminophen  (TYLENOL ) suppository 650 mg  650 mg Rectal Q6H PRN Amin, Ankit C, MD       bisacodyl  (DULCOLAX) EC tablet 5 mg  5 mg Oral Daily PRN Amin, Ankit C, MD       budesonide  (PULMICORT ) nebulizer solution  0.5 mg  0.5 mg Nebulization BID Amin, Ankit C, MD   0.5 mg at 11/14/24 0844   cefTRIAXone  (ROCEPHIN ) 2 g in sodium chloride  0.9 % 100 mL IVPB  2 g Intravenous Q24H Amin, Ankit C, MD 200 mL/hr at 11/14/24 0912 2 g at 11/14/24 0912   clonazePAM  (KLONOPIN ) tablet 0.5 mg  0.5 mg Oral BID PRN Amin, Ankit C, MD   0.5 mg at 11/14/24 0933   doxycycline  (VIBRA -TABS) tablet 100 mg  100 mg Oral Q12H Shah, Pratik D, DO   100 mg at 11/14/24 9081   enoxaparin  (LOVENOX ) injection 60 mg  60 mg Subcutaneous Q24H Amin, Ankit C, MD   60 mg at  11/13/24 1148   furosemide  (LASIX ) tablet 40 mg  40 mg Oral Daily Amin, Ankit C, MD   40 mg at 11/14/24 9081   glucagon  (human recombinant) (GLUCAGEN) injection 1 mg  1 mg Intravenous PRN Amin, Ankit C, MD       guaiFENesin  (MUCINEX ) 12 hr tablet 1,200 mg  1,200 mg Oral BID Amin, Ankit C, MD   1,200 mg at 11/14/24 9081   guaiFENesin  (ROBITUSSIN) 100 MG/5ML liquid 5 mL  5 mL Oral Q4H PRN Amin, Ankit C, MD       hydrALAZINE  (APRESOLINE ) injection 10 mg  10 mg Intravenous Q4H PRN Amin, Ankit C, MD       ipratropium-albuterol  (DUONEB) 0.5-2.5 (3) MG/3ML nebulizer solution 3 mL  3 mL Nebulization Q4H PRN Amin, Ankit C, MD       ipratropium-albuterol  (DUONEB) 0.5-2.5 (3) MG/3ML nebulizer solution 3 mL  3 mL Nebulization Q6H Amin, Ankit C, MD   3 mL at 11/14/24 0840   methylPREDNISolone  sodium succinate (SOLU-MEDROL ) 40 mg/mL injection 40 mg  40 mg Intravenous Q12H Shah, Pratik D, DO   40 mg at 11/14/24 0430   metoprolol  tartrate (LOPRESSOR ) injection 10 mg  10 mg Intravenous Q4H PRN Amin, Ankit C, MD       nicotine  (NICODERM CQ  - dosed in mg/24 hours) patch 21 mg  21 mg Transdermal Daily PRN Amin, Ankit C, MD       ondansetron  (ZOFRAN ) tablet 4 mg  4 mg Oral Q6H PRN Amin, Ankit C, MD       Or   ondansetron  (ZOFRAN ) injection 4 mg  4 mg Intravenous Q6H PRN Amin, Ankit C, MD       oxyCODONE  (Oxy IR/ROXICODONE ) immediate release tablet 5 mg  5 mg Oral Q4H PRN Amin, Ankit C, MD       pantoprazole  (PROTONIX ) EC tablet 40 mg  40 mg Oral Daily Amin, Ankit C, MD   40 mg at 11/14/24 9081   pregabalin  (LYRICA ) capsule 100 mg  100 mg Oral BID Amin, Ankit C, MD   100 mg at 11/14/24 0918   senna-docusate (Senokot-S) tablet 1 tablet  1 tablet Oral QHS PRN Amin, Ankit C, MD         Discharge Medications: TAKE these medications     acetaminophen  325 MG tablet Commonly known as: TYLENOL  Take 2 tablets (650 mg total) by mouth every 6 (six) hours as needed for mild pain (pain score 1-3) or fever (or Fever >/=  101).    albuterol  (2.5 MG/3ML) 0.083% nebulizer solution Commonly known as: PROVENTIL  Inhale 3 mLs (2.5 mg total) into the lungs every 4 (four) hours as needed for wheezing or shortness of breath.    albuterol  108 (90 Base) MCG/ACT inhaler Commonly known as: VENTOLIN  HFA Inhale 2 puffs  into the lungs every 4 (four) hours as needed for shortness of breath or wheezing (Cough).    azithromycin  250 MG tablet Commonly known as: ZITHROMAX  Take 250 mg by mouth daily.    benzonatate  100 MG capsule Commonly known as: TESSALON  Take 1 capsule (100 mg total) by mouth 3 (three) times daily as needed for cough.    Breztri  Aerosphere 160-9-4.8 MCG/ACT Aero inhaler Generic drug: budesonide -glycopyrrolate -formoterol  Inhale 2 puffs into the lungs 2 (two) times daily.    cefdinir  300 MG capsule Commonly known as: OMNICEF  Take 1 capsule (300 mg total) by mouth 2 (two) times daily for 3 days.    clonazePAM  1 MG tablet Commonly known as: KLONOPIN  Take 1 tablet (1 mg total) by mouth 2 (two) times daily as needed for anxiety.    furosemide  40 MG tablet Commonly known as: Lasix  Take 1 tablet (40 mg total) by mouth daily. What changed: Another medication with the same name was removed. Continue taking this medication, and follow the directions you see here.    guaiFENesin  600 MG 12 hr tablet Commonly known as: Mucinex  Take 1 tablet (600 mg total) by mouth 2 (two) times daily.    HYDROcodone -acetaminophen  7.5-325 MG tablet Commonly known as: NORCO Take 1 tablet by mouth every 4 (four) hours as needed for moderate pain (pain score 4-6).    omeprazole  40 MG capsule Commonly known as: PRILOSEC Take 1 capsule (40 mg total) by mouth in the morning and at bedtime.    OXYGEN  Inhale 4 L into the lungs continuous.    prednisoLONE acetate 1 % ophthalmic suspension Commonly known as: PRED FORTE SMARTSIG:In Eye(s)    predniSONE  10 MG tablet Commonly known as: DELTASONE  Take 4 tablets (40 mg total)  by mouth daily for 5 days.    pregabalin  100 MG capsule Commonly known as: LYRICA  Take 100 mg by mouth 2 (two) times daily.    Vitamin D (Ergocalciferol) 1.25 MG (50000 UNIT) Caps capsule Commonly known as: DRISDOL Take 50,000 Units by mouth once a week.      Relevant Imaging Results:  Relevant Lab Results:   Additional Information SSN: [id number] [place], LCSW     "

## 2024-11-16 LAB — CULTURE, BLOOD (ROUTINE X 2)
Culture: NO GROWTH
Culture: NO GROWTH
Special Requests: ADEQUATE

## 2024-11-26 ENCOUNTER — Ambulatory Visit: Attending: Internal Medicine | Admitting: Internal Medicine

## 2024-11-26 ENCOUNTER — Encounter: Payer: Self-pay | Admitting: Internal Medicine

## 2024-11-26 VITALS — BP 138/78 | HR 90 | Ht 66.0 in | Wt 272.0 lb

## 2024-11-26 DIAGNOSIS — J9611 Chronic respiratory failure with hypoxia: Secondary | ICD-10-CM

## 2024-11-26 DIAGNOSIS — I10 Essential (primary) hypertension: Secondary | ICD-10-CM | POA: Diagnosis not present

## 2024-11-26 DIAGNOSIS — J9612 Chronic respiratory failure with hypercapnia: Secondary | ICD-10-CM

## 2024-11-26 DIAGNOSIS — R0789 Other chest pain: Secondary | ICD-10-CM | POA: Diagnosis not present

## 2024-11-26 DIAGNOSIS — J449 Chronic obstructive pulmonary disease, unspecified: Secondary | ICD-10-CM | POA: Diagnosis not present

## 2024-11-26 DIAGNOSIS — Z79899 Other long term (current) drug therapy: Secondary | ICD-10-CM

## 2024-11-26 DIAGNOSIS — R079 Chest pain, unspecified: Secondary | ICD-10-CM

## 2024-11-26 NOTE — Patient Instructions (Signed)
 Medication Instructions:  Your physician recommends that you continue on your current medications as directed. Please refer to the Current Medication list given to you today.    *If you need a refill on your cardiac medications before your next appointment, please call your pharmacy*  Lab Work: Your provider would like for you to have following labs drawn today Lipid, ALT .     Testing/Procedures: No test ordered today   Follow-Up: At [facility], you and your health needs are our priority.  As part of our continuing mission to provide you with exceptional heart care, our providers are all part of one team.  This team includes your primary Cardiologist (physician) and Advanced Practice Providers or APPs (Physician Assistants and Nurse Practitioners) who all work together to provide you with the care you need, when you need it.  Your next appointment:   3 month(s)  Provider:   You may see Lonni Hanson, MD or one of the following Advanced Practice Providers on your designated Care Team:   Lonni Meager, NP Lesley Maffucci, PA-C Bernardino Bring, PA-C Cadence Shirley, PA-C Tylene Lunch, NP Barnie Hila, NP

## 2024-11-26 NOTE — Progress Notes (Signed)
 " Cardiology Office Note:  .   Date:  11/28/2024  ID:  Miguel Marsh, DOB 1961-07-11, MRN [id number] PCP: Renato Dorothey HERO, NP  [facility] Providers Cardiologist: New     History of Present Illness: Miguel Marsh is a 64 y.o. male with history of COPD complicated by chronic respiratory failure with hypoxia, lung cancer, diastolic heart failure, hypertension, and cocaine use, who has been referred for evaluation of chest pain.  He has been hospitalized multiple times over the last 6 months due to shortness of breath.  Around Thanksgiving, he developed an episode of severe chest pain.  He was sitting outside at his group home and suddenly felt like a truck was sitting on his chest.  The pain was in the center of his chest and did not radiate.  The episode lasted ~30 minutes and resolved spontaneously.  It has not happened since then; this was the first and only time he has had chest pain.  Miguel Marsh notes palpitations at the time of the episode but no other associated symptoms.  He was evaluated by EMS and subsequently transported to the [facility] ED, where no obvious cause was identified.  Today, Miguel Marsh notes that he feels back to his baseline after having been hospitalized for a COPD exacerbation earlier this month.  He continues to use 4L of oxygen  around the clock.  He notes sporadic palpitations.  He denies lightheadedness, edema, syncope, and orthopnea/PND.  He notes that his lung cancer was diagnosed in 2014.  He was initially treated with Keytruda and subsequently underwent XRT for recurrent cancer in 12/2023.  He stopped smoking about a month ago.  ROS: See HPI  Studies Reviewed: SABRA   EKG Interpretation Date/Time:  Wednesday November 26 2024 14:17:07 EST Ventricular Rate:  90 PR Interval:  122 QRS Duration:  80 QT Interval:  334 QTC Calculation: 408 R Axis:   16  Text Interpretation: Sinus rhythm with occasional Premature ventricular complexes Abnormal ECG  When compared with ECG of 11-Nov-2024 05:13, Premature ventricular complexes are now Present Confirmed by Wen Merced [phone]) on 11/28/2024 5:25:05 PM    TTE (10/24/2024): Normal LV size and wall thickness.  LVEF 55-60%; unable to assess wall motion.  Grade 1 diastolic dysfunction present.  Normal RV size and function.  Unable to assess PA pressure.  Normal biatrial size.  Suboptimally visualized valves, though no significant valvular abnormality noted.  Normal CVP.  Risk Assessment/Calculations:           Physical Exam:   VS:  BP 138/78 (BP Location: Left Arm, Patient Position: Sitting, Cuff Size: Large)   Pulse 90   Ht 5' 6 (1.676 m)   Wt 272 lb (123.4 kg)   SpO2 99% Comment: 3 L  BMI 43.90 kg/m    Wt Readings from Last 3 Encounters:  11/26/24 272 lb (123.4 kg)  11/14/24 272 lb 11.3 oz (123.7 kg)  10/27/24 267 lb 13.7 oz (121.5 kg)    General:  NAD. Neck: No JVD or HJR. Lungs: Diminished breath sounds throughout without wheezes or crackles. Heart: Regular rate and rhythm without murmurs, rubs, or gallops. Abdomen: Soft, nontender, nondistended. Extremities: No lower extremity edema.  ASSESSMENT AND PLAN: .    Atypical chest pain: Miguel Marsh reports a single episode of self-limited chest pain that occurred at rest ~2 months ago.  Review of labs from his ED visit that day and during other admissions is notable for minimal chronic HS-TnT  elevation (18-29).  This is most consistent with supply-demand mismatch.  His echo in 09/2024 showed normal LVEF, though regional wall motion was difficult to assess.  We discussed further ischemia testing with cardiac CTA versus catheterization (I do not think he would tolerate exercise or regadenoson well due to his severe COPD and limited mobility).  Given that he has not had any further chest pain, Miguel Marsh wishes to defer testing at this time.  We will check a lipid panel for risk stratification and consider adding a statin if LDL > 100,  given coronary artery calcification and aortic atherosclerosis noted on prior chest CT's.  Chronic respiratory failure with hypoxia and hypercapnia due to COPD: Continue supplemental oxygen  and bronchodilator therapy per pulmonary.  Hypertension: BP borderline elevated today.  Defer medication changes at this time.    Dispo: Return to clinic in 3 months.  Signed, Lonni Hanson, MD  "

## 2024-11-27 ENCOUNTER — Ambulatory Visit: Payer: Self-pay | Admitting: Internal Medicine

## 2024-11-27 DIAGNOSIS — Z79899 Other long term (current) drug therapy: Secondary | ICD-10-CM

## 2024-11-27 LAB — LIPID PANEL
Chol/HDL Ratio: 4.2 ratio (ref 0.0–5.0)
Cholesterol, Total: 241 mg/dL — ABNORMAL HIGH (ref 100–199)
HDL: 58 mg/dL
LDL Chol Calc (NIH): 141 mg/dL — ABNORMAL HIGH (ref 0–99)
Triglycerides: 232 mg/dL — ABNORMAL HIGH (ref 0–149)
VLDL Cholesterol Cal: 42 mg/dL — ABNORMAL HIGH (ref 5–40)

## 2024-11-27 LAB — ALT: ALT: 17 [IU]/L (ref 0–44)

## 2024-11-27 MED ORDER — ATORVASTATIN CALCIUM 20 MG PO TABS: 20.0000 mg | ORAL_TABLET | Freq: Every day | ORAL | 3 refills | Status: AC

## 2024-11-27 NOTE — Telephone Encounter (Signed)
 Patient returned RN's call regarding results.

## 2024-11-28 ENCOUNTER — Encounter: Payer: Self-pay | Admitting: Internal Medicine

## 2024-11-28 DIAGNOSIS — R0789 Other chest pain: Secondary | ICD-10-CM | POA: Insufficient documentation

## 2025-02-11 ENCOUNTER — Ambulatory Visit: Admitting: Internal Medicine
